# Patient Record
Sex: Female | Born: 1955 | Race: White | Marital: Married | Smoking: Never smoker
Health system: Northeastern US, Academic
[De-identification: ages and names within clinical notes are randomized; demographics above are authoritative.]

## PROBLEM LIST (undated history)

## (undated) VITALS — BP 132/74 | HR 75 | Temp 96.8°F | Resp 16 | Ht 63.0 in | Wt 110.3 lb

## (undated) VITALS — BP 122/68 | HR 69 | Temp 96.8°F | Resp 15 | Ht 63.0 in | Wt 111.0 lb

## (undated) DIAGNOSIS — I10 Essential (primary) hypertension: Secondary | ICD-10-CM

## (undated) DIAGNOSIS — Z8679 Personal history of other diseases of the circulatory system: Secondary | ICD-10-CM

## (undated) DIAGNOSIS — G4700 Insomnia, unspecified: Secondary | ICD-10-CM

## (undated) DIAGNOSIS — Z9889 Other specified postprocedural states: Secondary | ICD-10-CM

## (undated) DIAGNOSIS — R112 Nausea with vomiting, unspecified: Secondary | ICD-10-CM

## (undated) DIAGNOSIS — R0602 Shortness of breath: Secondary | ICD-10-CM

## (undated) DIAGNOSIS — I340 Nonrheumatic mitral (valve) insufficiency: Secondary | ICD-10-CM

## (undated) DIAGNOSIS — I480 Paroxysmal atrial fibrillation: Secondary | ICD-10-CM

## (undated) DIAGNOSIS — G250 Essential tremor: Secondary | ICD-10-CM

## (undated) DIAGNOSIS — Z973 Presence of spectacles and contact lenses: Secondary | ICD-10-CM

## (undated) DIAGNOSIS — R011 Cardiac murmur, unspecified: Secondary | ICD-10-CM

## (undated) DIAGNOSIS — I509 Heart failure, unspecified: Secondary | ICD-10-CM

## (undated) DIAGNOSIS — I471 Supraventricular tachycardia, unspecified: Secondary | ICD-10-CM

## (undated) DIAGNOSIS — I341 Nonrheumatic mitral (valve) prolapse: Secondary | ICD-10-CM

## (undated) DIAGNOSIS — E7800 Pure hypercholesterolemia, unspecified: Secondary | ICD-10-CM

## (undated) DIAGNOSIS — M1990 Unspecified osteoarthritis, unspecified site: Secondary | ICD-10-CM

## (undated) DIAGNOSIS — M8580 Other specified disorders of bone density and structure, unspecified site: Secondary | ICD-10-CM

## (undated) DIAGNOSIS — K589 Irritable bowel syndrome without diarrhea: Secondary | ICD-10-CM

## (undated) DIAGNOSIS — K219 Gastro-esophageal reflux disease without esophagitis: Secondary | ICD-10-CM

## (undated) DIAGNOSIS — I639 Cerebral infarction, unspecified: Secondary | ICD-10-CM

## (undated) DIAGNOSIS — M25371 Other instability, right ankle: Secondary | ICD-10-CM

## (undated) DIAGNOSIS — M810 Age-related osteoporosis without current pathological fracture: Secondary | ICD-10-CM

## (undated) DIAGNOSIS — E039 Hypothyroidism, unspecified: Secondary | ICD-10-CM

## (undated) DIAGNOSIS — M419 Scoliosis, unspecified: Secondary | ICD-10-CM

## (undated) DIAGNOSIS — R569 Unspecified convulsions: Secondary | ICD-10-CM

## (undated) DIAGNOSIS — M25372 Other instability, left ankle: Secondary | ICD-10-CM

## (undated) DIAGNOSIS — Q2730 Arteriovenous malformation, site unspecified: Secondary | ICD-10-CM

## (undated) DIAGNOSIS — H9319 Tinnitus, unspecified ear: Secondary | ICD-10-CM

## (undated) DIAGNOSIS — E785 Hyperlipidemia, unspecified: Secondary | ICD-10-CM

## (undated) HISTORY — PX: HX TONSILLECTOMY/ADENOIDECTOMY: SHX292

## (undated) HISTORY — PX: CONVERTED PROCEDURE: SHX9999

## (undated) HISTORY — DX: Essential (primary) hypertension: I10

## (undated) HISTORY — PX: COLONOSCOPY: SHX174

## (undated) HISTORY — DX: Hypothyroidism, unspecified: E03.9

## (undated) HISTORY — PX: HAND SURGERY: SHX662

## (undated) HISTORY — PX: CRANIOTOMY: SHX93

## (undated) HISTORY — PX: WRIST FRACTURE SURGERY: SHX121

## (undated) HISTORY — DX: Cerebral infarction, unspecified: I63.9

## (undated) HISTORY — PX: OTHER SURGICAL HISTORY: SHX169

## (undated) HISTORY — DX: Age-related osteoporosis without current pathological fracture: M81.0

## (undated) HISTORY — DX: Hyperlipidemia, unspecified: E78.5

## (undated) HISTORY — DX: Gastro-esophageal reflux disease without esophagitis: K21.9

## (undated) HISTORY — DX: Unspecified convulsions: R56.9

## (undated) HISTORY — DX: Irritable bowel syndrome, unspecified: K58.9

## (undated) HISTORY — PX: TONSILLECTOMY: SHX28A

## (undated) HISTORY — DX: Nonrheumatic mitral (valve) insufficiency: I34.0

## (undated) HISTORY — DX: Supraventricular tachycardia: I47.1

## (undated) HISTORY — PX: VARICOSE VEIN SURGERY: SHX832

## (undated) HISTORY — DX: Paroxysmal atrial fibrillation: I48.0

## (undated) HISTORY — PX: ABDOMINAL HYSTERECTOMY: SHX81

## (undated) HISTORY — PX: WISDOM TOOTH EXTRACTION: SHX21

## (undated) HISTORY — DX: Supraventricular tachycardia, unspecified: I47.10

## (undated) HISTORY — PX: CARDIAC CATHETERIZATION: SHX172

---

## 2005-10-19 DIAGNOSIS — M810 Age-related osteoporosis without current pathological fracture: Secondary | ICD-10-CM | POA: Insufficient documentation

## 2005-10-19 DIAGNOSIS — N3941 Urge incontinence: Secondary | ICD-10-CM | POA: Insufficient documentation

## 2005-10-19 DIAGNOSIS — L719 Rosacea, unspecified: Secondary | ICD-10-CM

## 2005-10-19 DIAGNOSIS — M4120 Other idiopathic scoliosis, site unspecified: Secondary | ICD-10-CM | POA: Insufficient documentation

## 2005-10-19 DIAGNOSIS — I629 Nontraumatic intracranial hemorrhage, unspecified: Secondary | ICD-10-CM | POA: Insufficient documentation

## 2005-10-19 DIAGNOSIS — K589 Irritable bowel syndrome without diarrhea: Secondary | ICD-10-CM | POA: Insufficient documentation

## 2005-10-19 DIAGNOSIS — I10 Essential (primary) hypertension: Secondary | ICD-10-CM | POA: Insufficient documentation

## 2005-10-19 DIAGNOSIS — I781 Nevus, non-neoplastic: Secondary | ICD-10-CM | POA: Insufficient documentation

## 2005-10-19 DIAGNOSIS — J309 Allergic rhinitis, unspecified: Secondary | ICD-10-CM | POA: Insufficient documentation

## 2005-10-19 DIAGNOSIS — E039 Hypothyroidism, unspecified: Secondary | ICD-10-CM | POA: Insufficient documentation

## 2005-10-19 HISTORY — DX: Urge incontinence: N39.41

## 2005-10-19 HISTORY — DX: Rosacea, unspecified: L71.9

## 2006-06-05 DIAGNOSIS — G40909 Epilepsy, unspecified, not intractable, without status epilepticus: Secondary | ICD-10-CM | POA: Insufficient documentation

## 2009-05-27 ENCOUNTER — Encounter: Payer: Self-pay | Admitting: Cardiology

## 2009-09-01 ENCOUNTER — Ambulatory Visit
Admit: 2009-09-01 | Discharge: 2009-09-01 | Disposition: A | Discharge status: Home / Self Care | Payer: Self-pay | Source: Ambulatory Visit | Attending: Primary Care | Admitting: Primary Care

## 2009-09-09 ENCOUNTER — Ambulatory Visit
Admit: 2009-09-09 | Discharge: 2009-09-09 | Disposition: A | Discharge status: Home / Self Care | Payer: Self-pay | Source: Ambulatory Visit | Attending: Primary Care | Admitting: Primary Care

## 2009-09-13 ENCOUNTER — Ambulatory Visit
Admit: 2009-09-13 | Discharge: 2009-09-13 | Disposition: A | Discharge status: Home / Self Care | Payer: Self-pay | Source: Ambulatory Visit | Attending: Neurology | Admitting: Neurology

## 2009-09-13 ENCOUNTER — Ambulatory Visit
Admit: 2009-09-13 | Discharge: 2009-09-13 | Disposition: A | Discharge status: Home / Self Care | Payer: Self-pay | Source: Ambulatory Visit | Attending: Primary Care | Admitting: Primary Care

## 2009-09-14 ENCOUNTER — Ambulatory Visit
Admit: 2009-09-14 | Discharge: 2009-09-14 | Disposition: A | Discharge status: Home / Self Care | Payer: Self-pay | Source: Ambulatory Visit | Attending: Neurology | Admitting: Neurology

## 2009-09-15 ENCOUNTER — Ambulatory Visit
Admit: 2009-09-15 | Discharge: 2009-09-15 | Disposition: A | Discharge status: Home / Self Care | Payer: Self-pay | Source: Ambulatory Visit | Attending: Orthopedic Surgery | Admitting: Orthopedic Surgery

## 2009-09-21 ENCOUNTER — Ambulatory Visit: Payer: Self-pay | Admitting: Rehabilitative and Restorative Service Providers"

## 2009-09-28 ENCOUNTER — Ambulatory Visit: Payer: Self-pay | Admitting: Rehabilitative and Restorative Service Providers"

## 2009-10-05 ENCOUNTER — Ambulatory Visit: Payer: Self-pay | Admitting: Rehabilitative and Restorative Service Providers"

## 2009-10-07 ENCOUNTER — Ambulatory Visit: Payer: Self-pay | Admitting: Rehabilitative and Restorative Service Providers"

## 2009-10-12 ENCOUNTER — Ambulatory Visit: Payer: Self-pay | Admitting: Rehabilitative and Restorative Service Providers"

## 2009-10-19 ENCOUNTER — Ambulatory Visit: Payer: Self-pay | Admitting: Neurology

## 2009-10-26 ENCOUNTER — Ambulatory Visit: Payer: Self-pay | Admitting: Rehabilitative and Restorative Service Providers"

## 2009-10-27 ENCOUNTER — Ambulatory Visit: Payer: Self-pay | Admitting: Orthopedic Surgery

## 2009-10-28 NOTE — Progress Notes (Signed)
 CHIEF COMPLAINT: Status post distal radius fracture.    INTERVAL HISTORY: Doing extremely well.    PHYSICAL EXAMINATION: On physical examination she has near full supination  and pronation.  Some weakness is still noted.  Some slight ulnar sided  wrist pain.    ASSESSMENT: Same.    PLAN: Doing well.  We will have her continue with physical therapy for  strengthening.  I will see her back in two months.                Electronically Signed and Finalized  by  Tyler Pita, MD 10/28/2009 13:40  ___________________________________________  Tyler Pita, MD      DD:   10/27/2009  DT:   10/27/2009  3:26 P  ZOX/[id number]  [id number]    cc:

## 2009-11-02 ENCOUNTER — Ambulatory Visit: Payer: Self-pay | Admitting: Rehabilitative and Restorative Service Providers"

## 2009-11-04 ENCOUNTER — Encounter: Payer: Self-pay | Admitting: Gastroenterology

## 2009-11-09 ENCOUNTER — Ambulatory Visit: Payer: Self-pay | Admitting: Rehabilitative and Restorative Service Providers"

## 2009-11-09 NOTE — Progress Notes (Signed)
Sports PT Evaluation   SPORTS REHABILITATION STATUS NOTE     Diagnosis: right distal radius fracture  Referring MD: Dr. Felipa Evener     History:  the patient is a 53 year old female referred to physical therapy   for evaluation and treatment of her right wrist radial fracture.  At this   time she reports continued improvement in her right wrist strength and   function.  Pain:  0/10 at rest.     OBJECTIVE   Observation:  unremarkable  UEA:VWUJW wrist flexion 60 degrees and extension 45 degrees.  Right wrist   pronation and supination are within normal limits.  Strength:right wrist flexion, extension, supination and pronation continued   to be a 4/5.  Right thenar opposition is a 5/5.  Neuro: deferred  Special Tests:deferred  Functional Eval:deferred           Assessment: clinical findings are consistent with a right distal radius   fracture with overall improved range of motion and continued strength   deficits.  Rehab Prognosis:fair to good  Contraindications/Precautions:no contraindications to physical therapy are   present at this time.     GOALS  JXB:JYNW-GNFA goals will be met in 4 weeks.  Increase right wrist strength to a 5/5 in all planes.  Return to ADLs with no further complaints of functional limitations.     PLAN: Plan and treatment options reviewed with patient.   Recommend treatment freq.  one time per week for 4 weeks   Treatment to include wrist active range of motion exercises, progressive   strengthening exercises, grip strengthening and home exercise program   progression.        Thank you for the referral of this patient to [facility]. Please do not hesitate to contact me if I may be of   assistance.     Sincerely,      Noni Saupe, PT, SCS, CSCS  .  Signature   Electronically signed by: Loney Hering  PT,CSCS; 11/09/2009   2:55 PM EST.

## 2009-11-16 ENCOUNTER — Ambulatory Visit: Payer: Self-pay | Admitting: Rehabilitative and Restorative Service Providers"

## 2009-11-29 ENCOUNTER — Encounter: Payer: Self-pay | Admitting: Gastroenterology

## 2009-11-30 ENCOUNTER — Ambulatory Visit: Payer: Self-pay | Admitting: Rehabilitative and Restorative Service Providers"

## 2009-12-07 ENCOUNTER — Ambulatory Visit: Payer: Self-pay | Admitting: Rehabilitative and Restorative Service Providers"

## 2009-12-14 ENCOUNTER — Ambulatory Visit: Payer: Self-pay | Admitting: Rehabilitative and Restorative Service Providers"

## 2009-12-21 ENCOUNTER — Ambulatory Visit: Payer: Self-pay | Admitting: Rehabilitative and Restorative Service Providers"

## 2009-12-22 ENCOUNTER — Ambulatory Visit: Payer: Self-pay | Admitting: Orthopedic Surgery

## 2009-12-22 NOTE — Progress Notes (Signed)
 Sports PT Evaluation   SPORTS REHABILITATION STATUS NOTE     Diagnosis: right distal radius fracture and bilateral Achilles tightness  Referring MD: Dr. Felipa Evener and Dr. Gaston Islam     History:  the patient is a 54 year old female referred to physical therapy   for treatment of her right distal radius fracture and bilateral Achilles   tightness.  Overall she reports continued gradual improvement in her right   wrist strength and function.  At this time she has minimal functional   limitations do to her right wrist by the patient's report.  She does   continue to complain of significant balance deficits.  Pain:  right wrist pain is a 2-3/10 at its worst.  No complaints of right   forefoot pain on this date.     OBJECTIVE   Observation:  ambulation is with a widened base of support and slightly   antalgic.  ZOX:WRUEA wrist flexion is 75 degrees and extension to 60 degrees.  Right   wrist pronation and supination are within normal limits.  Strength:right wrist flexion is a 4/5 and extension is 5/5.  thenar   opposition is a 5/5 on the right.  right grip strength with the elbow   flexed is 3 pounds and with the elbow extended is 25 pounds.  Left grip   strength with the elbow flexed is 20 pounds and with the elbow extended is   25 pounds.  Neuro: deferred  Special Tests:deferred  Functional Eval:unable to maintain unilateral balance without upper   extremity support.  Unable to maintain tandem stance without upper   extremity support.           Assessment: clinical findings are consistent with a right distal radius   fracture with functional range of motion and improving strength.  Also   continued gait and balance deficits.  Rehab Prognosis:fair  Contraindications/Precautions:none     GOALS  VWU:JWJX-BJYN goals will be met in 4 weeks.  Increase right wrist flexion strength to be 5/5.  He improved in her balance to 15 seconds without upper extremity support.     PLAN: Plan and treatment options reviewed with patient.    Recommend treatment freq.  one time per week for 4 weeks   Treatment to include progressive wrist strengthening, balance training,   gait training, and home exercise program progression.        Thank you for the referral of this patient to [facility]. Please do not hesitate to contact me if I may be of   assistance.     Sincerely,      Noni Saupe, PT, SCS, CSCS  .  Signature   Electronically signed by: Loney Hering  PT,CSCS; 12/22/2009   9:53 AM EST.

## 2009-12-28 ENCOUNTER — Ambulatory Visit: Payer: Self-pay | Admitting: Rehabilitative and Restorative Service Providers"

## 2009-12-29 ENCOUNTER — Ambulatory Visit: Payer: Self-pay | Admitting: Orthopedic Surgery

## 2010-01-04 ENCOUNTER — Ambulatory Visit: Payer: Self-pay | Admitting: Rehabilitative and Restorative Service Providers"

## 2010-01-16 ENCOUNTER — Emergency Department: Admit: 2010-01-16 | Disposition: A | Discharge status: Home / Self Care | Payer: Self-pay | Source: Ambulatory Visit

## 2010-01-16 NOTE — ED Provider Notes (Signed)
 VISIT NUMBER:  [id number].    I saw and evaluated the patient.  I agree with the resident's/fellow's  finding and plan of care as documented.  Details of my evaluation are as  follows.    HPI:  This is a 54 year old female who presents here with left wrist pain  after an injury.  She missed a chair, fell over, caught herself on her left  wrist and since then it has been somewhat sore.  She is able to move it and  perform tasks without difficulty, but given her previous injury there,  plating and screws.  She has not had any head injury, loss of  consciousness, neck, back, or other injuries.    ALLERGIES:  Dilantin, Bactrim, sulfa, penicillin.  MEDICATIONS:     1. Tegretol.     2. Claritin.     3. Synthroid.     4. Norvasc.    PAST MEDICAL / SURGICAL HISTORY:  Significant for AVM rupture,  hypothyroidism, hypertension, seizures.    FAMILY HISTORY:  Significant for hypertension.    SOCIAL HISTORY:   No tobacco or alcohol  use.    REVIEW OF SYSTEMS:   Please see notation in paper chart.    PHYSICAL EXAMINATION:  Temperature:  36.9.  Respirations:  16.  Pulse:  78.  Blood pressure:  172/100.  Pulse oximetry:  99% on room air.  General:  Age appropriate female resting comfortably, acting  appropriately.  Extremities:  Examination of her left wrist shows slightly diffuse  tenderness, no point tenderness present.  No snuffbox tenderness.  Sensation is intact.  Motor is intact.  Range of motion is limited due to  previous surgery, but intact.    MEDICAL DECISION MAKING/CONDITION:   A 54 year old female who comes in with  left wrist injury.  Will check wrist films and if they are okay, we will  discharge her home.  If not, we will consult with Orthopedic Surgery.        DDX:   Fracture, sprain, hardware failure.    DATA:        RADIOLOGIC STUDIES:    X-rays, per Radiology no acute fractures or  changes to the hardware.    FOLLOW-UP NOTE AND DISPOSITION:   Patient has done well throughout her stay  here.  We reassured her and  will discharge her home.    ED DIAGNOSIS:   Wrist sprain.                                        Electronically Signed and Finalized  by  Otilio Saber, MD 01/17/2010 08:46  ___________________________________________  Otilio Saber, MD      DD:   01/16/2010  DT:   01/16/2010  8:53 P  [id number]  [id number]    cc:   Deirdre Priest, MD

## 2010-01-18 ENCOUNTER — Ambulatory Visit: Payer: Self-pay

## 2010-01-18 ENCOUNTER — Ambulatory Visit: Payer: Self-pay | Admitting: Rehabilitative and Restorative Service Providers"

## 2010-01-21 NOTE — Progress Notes (Signed)
CHIEF COMPLAINT: Status post distal radius fracture.    INTERVAL HISTORY: Doing well.    PHYSICAL EXAMINATION: On physical examination she has excellent range of  motion, improving supination and pronation.  Neurovascular exam is intact.    ASSESSMENT: Same.    PLAN: I discussed in detail my findings with her.  Making good progress.  She will continue with therapy in the short term and will follow up p.r.n.                  Electronically Signed and Finalized  by  Tyler Pita, MD 01/21/2010 13:35  ___________________________________________  Tyler Pita, MD      DD:   12/29/2009  DT:   12/29/2009  1:46 P  RUE/[id number]  [id number]    cc:   Deirdre Priest, MD

## 2010-01-24 ENCOUNTER — Ambulatory Visit: Payer: Self-pay | Admitting: Orthopedic Surgery

## 2010-01-25 ENCOUNTER — Ambulatory Visit: Payer: Self-pay | Admitting: Rehabilitative and Restorative Service Providers"

## 2010-01-25 ENCOUNTER — Ambulatory Visit: Payer: Self-pay | Admitting: Otolaryngology

## 2010-01-25 NOTE — Progress Notes (Signed)
Otolaryngology Consultation Note   Dear Dr. Deirdre Priest:     I had the pleasure of seeing your patient, [name] in the [facility] today. She is a 54 year old female being seen for   evaluation of hearing loss.  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  She is not aware of any major hearing loss at   that time.  She has noted bilateral tinnitus for about the last year.  She   notes bilateral fullness most of the time.  She denies significant chronic   nasal stuffiness.  She is having a little bit of difficulty hearing, and   feels that her right ear is worse than her left.  Allergies   Azithromycin TABS; Nausea  Celebrex CAPS  Dilantin CAPS; Edema  Ditropan TABS; Itching  Erythromycin TABS; Nausea  Latex-asked/denied  Norvasc TABS; Constipation  Penicillins; Hives  Sulfa Drugs  Vasotec TABS; Fatigue.  Current Meds   Claritin 10 MG Tablet;TAKE 1 TABLET DAILY.; RPT  Caltrate 600 + D 600-200 MG-IU TABS;TAKE  TABLET 3 TIMES DAILY; RPT  Multivitamins Tablet;TAKE 1 TABLET DAILY.; RPT  TEGretol XR 100 MG Tablet Extended Release 12 Hour;TAKE 1 TABLET TWICE   DAILY; Rx  Advair Diskus 250-50 MCG/DOSE Aerosol Powder Breath Activated;Inhale 1 puff   by mouth twice a day; Rx  Levothyroxine Sodium 25 MCG Tablet;TAKE 1 TABLET BY MOUTH   EVERY DAY; Rx  TEGretol XR 200 MG Tablet Extended Release 12 Hour;TAKE 2 TABLET TWICE   DAILY; Rx  Enablex 15 MG Tablet Extended Release 24 Hour;TAKE 1 TABLET DAILY.; Rx  Astelin 137 MCG/SPRAY Solution;USE 2 SPRAYS 4 TIMES DAILY AS DIRECTED; Rx  Noritate 1 % Cream;; RPT  AmLODIPine Besylate 5 MG Tablet;TAKE 1 TABLET DAILY.; Rx.  ROS   The Otolaryngology Medical History and Review of Systems questionnaire,   which includes medical, surgical, social, and family history, was reviewed   with the patient and scanned to the chart.  Significant positive findings   include hypertension, seizures, and AVM  rupture.  Marland Kitchen  Results   An audiogram from [place] [facility] and there was reviewed which   shows downsloping hearing in both ears.  She has a 15 to 20 dB asymmetry at   2000 and 3000 Hz, as well as at 8000 Hz, worse in the right ear.  Vital Signs   Recorded by FOTI,ERICA on 25 Jan 2010 10:30 AM  BP:135/72,   HR: 155 b/min,   Temp: 36.8 C,   Pain Scale: 0,   O2 Sat: 99 (%SpO2).  Physical Exam   GENERAL: Appears well, normal development. NAD. Color good.      HEAD/FACE: The head and face reveal normal facial symmetry without lesions   or scars. The salivary glands are palpated and appear normal without   tenderness. There is normal facial nerve strength and symmetry. Extraocular   motion is full without restriction of gaze, or diplopia.     EARS: Both pinnas are normal in appearance with no scars, lesions or   masses. The ear canals and both tympanic membranes are intact without   inflammation. There is no mastoid erythema or tenderness. Both TM's are   mobile to pneumatoscopy without evidence of middle ear effusions.      NOSE: The nasal mucosa is not inflamed but is somewhat dry and no polyps or  masses are visualized. The nasal septum is essentially midline and the   inferior turbinates are normal.  The paranasal sinuses are nontender.    There is no evidence of septal perforation.     ORAL CAVITY: The buccal and oral mucosa are without lesions and the mouth   and oropharynx are normal in appearance without lesions or inflammation.   The lips, teeth and tongue appear normal.     NECK: There are no evident masses, there is no asymmetry nor is there any   cervical lymphadenopathy noted. The trachea is midline and the thyroid   gland reveals no enlargement, tenderness nor mass effect.  Assessment   ** Medication reconciliation completed and patient declined printed list. 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  Her word   discrimination scores are 96% bilaterally.  Acoustic reflexes are  present,   according to her audiogram from [facility] [facility] [employer].  This   point I feel that her risk for retrocochlear pathology is very low.  I have   recommended a follow-up audiogram in 6 months.  She is a candidate for   amplification in the right ear, and may pursue that either here or at [facility].    She will contact me if she develops any new or unusual symptoms.  Please   feel free to call me if you have any questions or concerns about her.     With best personal regards        Emilio Aspen, M.D.  Associate Professor  Otology/Neurotology  Department of Otolaryngology     Dictated with voice recognition software, not read.  Nurse, mental health   Electronically signed by: Zenovia Jarred  MD Attend.; 01/25/2010 11:49 AM EST.

## 2010-01-27 DIAGNOSIS — H903 Sensorineural hearing loss, bilateral: Secondary | ICD-10-CM | POA: Insufficient documentation

## 2010-01-27 DIAGNOSIS — H9319 Tinnitus, unspecified ear: Secondary | ICD-10-CM | POA: Insufficient documentation

## 2010-01-27 HISTORY — DX: Sensorineural hearing loss, bilateral: H90.3

## 2010-02-01 ENCOUNTER — Ambulatory Visit: Payer: Self-pay | Admitting: Rehabilitative and Restorative Service Providers"

## 2010-02-07 NOTE — Progress Notes (Signed)
CHIEF COMPLAINT:  Status post fall onto left wrist.    INTERVAL HISTORY:  The patient is here today for evaluation.  She is seen  in the ER after a fall on her wrist.    IMAGING STUDIES:  X-rays were reviewed and no fractures were identified.    PHYSICAL EXAMINATION:  Almost no swelling is noted.  She has good range of  motion.    PLAN:  She will follow up p.r.n.                  Electronically Signed and Finalized  by  Tyler Pita, MD 02/07/2010 12:03  ___________________________________________  Tyler Pita, MD      DD:   01/24/2010  DT:   01/24/2010  6:42 P  AVW/[id number]  [id number]    cc:

## 2010-02-08 ENCOUNTER — Ambulatory Visit: Payer: Self-pay | Admitting: Rehabilitative and Restorative Service Providers"

## 2010-02-15 ENCOUNTER — Ambulatory Visit: Payer: Self-pay | Admitting: Rehabilitative and Restorative Service Providers"

## 2010-02-15 NOTE — Progress Notes (Signed)
Sports PT Evaluation   SPORTS REHABILITATION STATUS NOTE     Diagnosis: right distal radius fracture and bilateral Achilles tightness  Referring MD: Dr. Felipa Evener and Dr. Gaston Islam     History:  the patient is a 56 showed female referred to physical therapy   for treatment of her right distal radius fracture and bilateral Achilles   tightness.  At this time she reports increasing right wrist strength with   decreasing complaints of functional limitations.  She is able to use her   right upper extremity to open doors and ADLs with minimal complaints of   pain.  She also reports slight improvement in balance and ability to   ambulate.  Pain:  right wrist pain is a 4/10 at its worst     OBJECTIVE   Observation:  unremarkable  RUE:AVWUJ wrist flexion is 75 degrees and extension is 70 degrees active   range of motion.  Right wrist pronation and supination are within normal   limits.  Strength:right grip strength is 30 pounds and left grip strength is 17   pounds with the elbow flexed.  Right wrist flexion and extension strength   are both a 5/5.  Neuro: deferred  Special Tests:deferred  Functional Eval:unable to maintain unilateral balance without upper   extremity support for greater than 5 seconds.  Ambulation continues with a   wide base of support and significant lateral deviations.           Assessment: clinical findings are consistent with a right distal radius   fracture with increased right upper extremity strength.  The patient also   continues to demonstrate significant balance deficits.  Rehab Prognosis:fair  Contraindications/Precautions:none     GOALS  WJX:BJYN-WGNF goals were met in 8 weeks.  Increase unilateral balance to greater than 15 seconds without upper   extremity support.  Return to ADLs with the right upper extremity with 0/10 complaints of pain.     PLAN: Plan and treatment options reviewed with patient.   Recommend treatment freq.  one time per week for 8 weeks   Treatment to include right upper  shoulder strengthening, proprioception   training, lower extremity strengthening and home exercise program   progression.        Thank you for the referral of this patient to [facility]. Please do not hesitate to contact me if I may be of   assistance.     Sincerely,      Noni Saupe, PT, SCS, CSCS  .  Signature   Electronically signed by: Loney Hering  PT,CSCS; 02/15/2010   1:46 PM EST.

## 2010-02-22 ENCOUNTER — Ambulatory Visit: Payer: Self-pay

## 2010-03-01 ENCOUNTER — Ambulatory Visit: Payer: Self-pay | Admitting: Rehabilitative and Restorative Service Providers"

## 2010-03-08 ENCOUNTER — Ambulatory Visit: Payer: Self-pay | Admitting: Rehabilitative and Restorative Service Providers"

## 2010-03-14 ENCOUNTER — Ambulatory Visit: Payer: Self-pay | Admitting: Primary Care

## 2010-03-15 ENCOUNTER — Ambulatory Visit: Payer: Self-pay | Admitting: Rehabilitative and Restorative Service Providers"

## 2010-03-15 ENCOUNTER — Ambulatory Visit: Payer: Self-pay | Admitting: Neurology
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
## 2010-03-21 ENCOUNTER — Ambulatory Visit
Admit: 2010-03-21 | Discharge: 2010-03-21 | Disposition: A | Discharge status: Home / Self Care | Payer: Self-pay | Source: Ambulatory Visit | Attending: Neurology | Admitting: Neurology

## 2010-03-21 ENCOUNTER — Ambulatory Visit
Admit: 2010-03-21 | Discharge: 2010-03-21 | Disposition: A | Discharge status: Home / Self Care | Payer: Self-pay | Source: Ambulatory Visit | Attending: Primary Care | Admitting: Primary Care

## 2010-03-21 LAB — BASIC METABOLIC PANEL
Anion Gap: 9 (ref 7–16)
CO2: 31 mmol/L — ABNORMAL HIGH (ref 20–28)
Calcium: 9.5 mg/dL (ref 8.6–10.2)
Chloride: 102 mmol/L (ref 96–108)
Creatinine: 0.65 mg/dL (ref 0.51–0.95)
GFR,Black: >59 *
GFR,Caucasian: >59 *
Glucose: 130 mg/dL — ABNORMAL HIGH (ref 74–106)
Lab: 18 mg/dL (ref 6–20)
Potassium: 4.4 mmol/L (ref 3.3–5.1)
Sodium: 142 mmol/L (ref 133–145)

## 2010-03-21 LAB — CARBAMAZEPINE: Carbamazepine: 9 ug/mL (ref 4.0–12.0)

## 2010-03-21 LAB — TSH: TSH: 2.02 u[IU]/mL (ref 0.27–4.20)

## 2010-03-22 ENCOUNTER — Ambulatory Visit: Payer: Self-pay | Admitting: Rehabilitative and Restorative Service Providers"

## 2010-03-22 NOTE — Progress Notes (Signed)
Neurology Stroke Clinic Note   Dear Herbert Seta  I had the pleasure of seeing [name] today along with her husband   for follow-up of her seizures and arteriovenous malformation, status post   surgical repair.  She has had no recurrent seizure activity.  She has been   tolerating her Tegretol well.  She continues to receive physical therapy   once weekly for gait training.  She is also being evaluated for hearing   loss in the right ear and has been recommended for amplification.     Review of Systems: Patient describes no shortness of breath, no chest pain,   no abdominal pain, no nausea or vomiting, no bowel or bladder symptoms, no   joint pain, and no skin changes. Recovering from left wrist sprain in   February.     Examination:  Overall she appears well.  Her speech is clear, comprehension is intact.   her affect is appropriate.  Cranial nerves: Visual fields are full to   confrontation.  Versions are full without nystagmus.  Facial strength is   normal.  She has good power throughout.  She had a mild sustention tremor   bilaterally.  Her gait remains mildly wide-based.     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  Her Tegretol level is 9.0 despite adding Prevacid for a   recent GI illness.  We discussed at length the possibility of switching to   Lamictal to attempt to decrease her reports of cognitive dulling or perhaps   to have her undergo a more formal evaluation through [facility].  She would like to think about these options.  For now she will   continue on her current medications.  I plan seeing her again in 6 months.     Sincerely,  Greer Pickerel M.D. M.P.H.  Associate Professor of Neurology and Neurosurgery.  Current Meds   Claritin 10 MG Tablet;TAKE 1 TABLET DAILY.; RPT  Caltrate 600 + D 600-200 MG-IU TABS;TAKE  TABLET 3 TIMES DAILY; RPT  Multivitamins Tablet;TAKE 1 TABLET DAILY.; RPT  AmLODIPine Besylate 5 MG Tablet;TAKE 1  TABLET DAILY.; Rx  Advair Diskus 250-50 MCG/DOSE Aerosol Powder Breath Activated;Inhale 1 puff   by mouth twice a day; Rx  Levothyroxine Sodium 25 MCG Tablet;TAKE 1 TABLET BY MOUTH   EVERY DAY; Rx  TEGretol XR 200 MG Tablet Extended Release 12 Hour;TAKE 2 TABLET TWICE   DAILY; RPT  Non-medication order(s);PT: evaluate and treatDx: bilateral lower extremity   weakness Osteoporosis, Sz d/o s/p AVM rupture; Rx  Vitamin D 1000 UNIT Capsule;TAKE 1 CAPSULE 3 TIMES DAILY; RPT  TEGretol XR 100 MG Tablet Extended Release 12 Hour;TAKE 1 TABLET TWICE   DAILY; Rx  Azelastine HCl 137 MCG/SPRAY Solution;USE 2 SPRAYS 4 TIMES DAILY AS   DIRECTED; Rx  Atelvia 35 MG Tablet Delayed Release;TAKE 1 TABLET BY MOUTH WEEKLY; RPT  Enablex 15 MG Tablet Extended Release 24 Hour;TAKE 1 TABLET DAILY.; Rx  Noritate 1 % Cream;; RPT.  Family Hx   Sororal history of Allergies; foods  Maternal history of Dementia  Family history of Family Health Status ___ Children Living; one son.  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  Vital Signs   Recorded by GOVAN,DEREK on 15 Mar 2010 09:15 AM  BP:129/71,  LUE,  Sitting,   HR: 72 b/min,   Weight: 118.4  lb,   Pain Scale: 0  Recorded by [facility] on 14 Mar 2010 09:33 AM  BP:132/80,  LUE,  Sitting  Recorded by Jeanella Anton on 14 Mar 2010 09:09 AM  BP:142/80,  LUE,  Sitting,   HR: 72 b/min,  L Radial, Regular,   Weight: 116.8 lb.  Signature   Electronically signed by: Greer Pickerel  M.D.; 03/22/2010 8:00 AM EST.

## 2010-03-23 LAB — VITAMIN D
25-OH VIT D2: <4 ng/mL
25-OH VIT D3: 44 ng/mL
25-OH Vit Total: 44 ng/mL (ref 30–80)

## 2010-03-25 ENCOUNTER — Ambulatory Visit: Payer: Self-pay | Admitting: Neurology

## 2010-03-28 ENCOUNTER — Ambulatory Visit: Payer: Self-pay

## 2010-03-29 ENCOUNTER — Ambulatory Visit: Payer: Self-pay | Admitting: Rehabilitative and Restorative Service Providers"

## 2010-03-29 NOTE — Progress Notes (Signed)
Sports PT Evaluation   SPORTS REHABILITATION STATUS NOTE     Diagnosis: right distal radius fracture, bilateral lower extremity weakness  Referring MD: Dr. Felipa Evener and Dr. Elmer Bales     History:  the patient is a 54 year old female referred to physical therapy   for her treatment of her distal radius fracture and bilateral lower   extremity weakness.  At this time she reports minimal complaints of pain in   her right wrist.  She continues to report difficulty with completing tasks   that require heavy gripping.  She also reports continued significant   balance deficits with lower extremity weakness.  Pain:  0/10 complaints of pain.     OBJECTIVE   Observation:  unremarkable  ZOX:WRUEA wrist range of motion is unchanged with flexion at 75 degrees,   extension 70 degrees and pronation/supination within normal limits.  Strength:right grip strength is 32 pounds and left grip strength is 22   pounds.  Neuro: deferred  Special Tests:deferred  Functional Eval:for both the left and right lower extremities the patient   is unable to balance with her eyes closed.  Left unilateral balance eyes   open  5 seconds and right unilateral balance eyes open 5 seconds.           Assessment: clinical findings are consistent with a right distal radius,   fracture with slow progressing right wrist/hand strength and bilateral   lower extremity deconditioning with balance deficits.  Rehab Prognosis:fair  Contraindications/Precautions:none     GOALS  VWU:JWJX-BJYN goals will be met in 8 weeks  Increased unilateral balance to greater than 15 seconds without upper   extremity support.  Increased right upper extremity strength to allow for gripping ADLs without   complaints of pain or limitation.     PLAN: Plan and treatment options reviewed with patient.   Recommend treatment freq.  one time per week for 8 weeks   Treatment to include right upper extremity strengthening, bilateral lower   extremity strengthening, proprioception training and  home exercise program   progression.        Thank you for the referral of this patient to [facility]. Please do not hesitate to contact me if I may be of   assistance.     Sincerely,      Noni Saupe, PT, SCS, CSCS  .  Signature   Electronically signed by: Loney Hering  PT,CSCS; 03/29/2010   1:27 PM EST.
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
## 2010-04-05 ENCOUNTER — Ambulatory Visit: Payer: Self-pay | Admitting: Rehabilitative and Restorative Service Providers"

## 2010-04-12 ENCOUNTER — Ambulatory Visit: Payer: Self-pay | Admitting: Rehabilitative and Restorative Service Providers"

## 2010-04-19 ENCOUNTER — Ambulatory Visit: Payer: Self-pay | Admitting: Rehabilitative and Restorative Service Providers"

## 2010-04-26 ENCOUNTER — Ambulatory Visit: Payer: Self-pay | Admitting: Rehabilitative and Restorative Service Providers"

## 2010-05-03 LAB — HM PAP SMEAR

## 2010-05-06 ENCOUNTER — Ambulatory Visit: Payer: Self-pay

## 2010-05-10 ENCOUNTER — Ambulatory Visit: Payer: Self-pay | Admitting: Rehabilitative and Restorative Service Providers"

## 2010-05-17 ENCOUNTER — Ambulatory Visit: Payer: Self-pay | Admitting: Rehabilitative and Restorative Service Providers"

## 2010-05-17 NOTE — Progress Notes (Signed)
Sports PT Evaluation   SPORTS REHABILITATION STATUS NOTE     Diagnosis: right distal radius fracture, bilateral lower extremity weakness  Referring MD: Dr. Carin Hock and Dr. Elmer Bales     History:  the patient is a 54 year old female referred to physical therapy   for treatment of her distal radius fracture and bilateral lower extremity   weakness.  She continues to report significant functional strength   limitations with gripping using her right hand.  She mainly complains of   weakness with thenar opposition.  She also continues to experience   bilateral lower extremity weakness with significant balance deficits.  Pain:  0/10 complaints of pain     OBJECTIVE   Observation:  unremarkable  ZOX:WRUEA wrist flexion is 75 degrees and extension is 70 degrees.  Right   forearm pronation and supination are within normal limits  Strength:right wrist flexion and extension are a 4/5.  Right forearm   pronation and supination are both a 4/5.  Right knee extension is a 3+/5 in flexion as a 4/5.  Left knee extension is   a 4/5 and flexion is a 4/5.  Neuro: deferred  Special Tests:deferred  Functional Eval:the patient continues to demonstrate significant balance   deficits with inability to balance in single leg stance with her eyes   closed.        Assessment: clinical findings are consistent with a right distal radius   fracture and bilateral lower extremity deconditioning with significant   balance deficits.  Rehab Prognosis:fair  Contraindications/Precautions:none     GOALS  VWU:JWJX-BJYN goals will be met in 8 weeks.  Improve bilateral lower extremity strength to a 5/5.  Increase right hand strength to allow for ADLs without complaints of   limitation.     PLAN: Plan and treatment options reviewed with patient.   Recommend treatment freq.  one time per week for 8 weeks   Treatment to include right upper extremity strengthening, proprioceptive   training, lower extremity strengthening and home exercise program   progression.        Thank you for the referral of this patient to [facility]. Please do not hesitate to contact me if I may be of   assistance.     Sincerely,      Noni Saupe, PT, SCS, CSCS  .  Signature   Electronically signed by: Loney Hering  PT,CSCS; 05/17/2010   1:59 PM EST.

## 2010-05-31 ENCOUNTER — Ambulatory Visit: Payer: Self-pay | Admitting: Rehabilitative and Restorative Service Providers"

## 2010-06-07 ENCOUNTER — Ambulatory Visit: Payer: Self-pay | Admitting: Rehabilitative and Restorative Service Providers"

## 2010-06-14 ENCOUNTER — Ambulatory Visit: Payer: Self-pay | Admitting: Rehabilitative and Restorative Service Providers"

## 2010-06-21 ENCOUNTER — Ambulatory Visit: Payer: Self-pay

## 2010-06-21 ENCOUNTER — Ambulatory Visit: Payer: Self-pay | Admitting: Rehabilitative and Restorative Service Providers"

## 2010-06-23 ENCOUNTER — Ambulatory Visit: Payer: Self-pay | Admitting: Rehabilitative and Restorative Service Providers"

## 2010-06-30 ENCOUNTER — Ambulatory Visit: Payer: Self-pay | Admitting: Rehabilitative and Restorative Service Providers"

## 2010-07-12 ENCOUNTER — Ambulatory Visit: Payer: Self-pay | Admitting: Rehabilitative and Restorative Service Providers"
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
## 2010-07-19 ENCOUNTER — Ambulatory Visit: Payer: Self-pay | Admitting: Rehabilitative and Restorative Service Providers"

## 2010-07-20 ENCOUNTER — Ambulatory Visit: Payer: Self-pay | Admitting: Rehabilitative and Restorative Service Providers"

## 2010-07-21 ENCOUNTER — Ambulatory Visit: Payer: Self-pay | Admitting: Rehabilitative and Restorative Service Providers"

## 2010-07-26 ENCOUNTER — Ambulatory Visit: Payer: Self-pay | Admitting: Rehabilitative and Restorative Service Providers"

## 2010-07-27 ENCOUNTER — Ambulatory Visit: Payer: Self-pay

## 2010-08-02 ENCOUNTER — Ambulatory Visit: Payer: Self-pay

## 2010-08-02 ENCOUNTER — Ambulatory Visit: Payer: Self-pay | Admitting: Rehabilitative and Restorative Service Providers"

## 2010-08-09 ENCOUNTER — Ambulatory Visit: Payer: Self-pay | Admitting: Rehabilitative and Restorative Service Providers"

## 2010-08-09 ENCOUNTER — Ambulatory Visit: Payer: Self-pay

## 2010-08-12 ENCOUNTER — Ambulatory Visit: Payer: Self-pay

## 2010-08-15 ENCOUNTER — Ambulatory Visit: Payer: Self-pay | Admitting: Primary Care

## 2010-08-15 ENCOUNTER — Encounter: Payer: Self-pay | Admitting: Primary Care
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
## 2010-08-16 ENCOUNTER — Ambulatory Visit: Payer: Self-pay | Admitting: Rehabilitative and Restorative Service Providers"

## 2010-08-23 ENCOUNTER — Ambulatory Visit: Payer: Self-pay

## 2010-08-30 ENCOUNTER — Ambulatory Visit: Payer: Self-pay | Admitting: Rehabilitative and Restorative Service Providers"

## 2010-09-06 ENCOUNTER — Ambulatory Visit: Payer: Self-pay

## 2010-09-06 ENCOUNTER — Ambulatory Visit: Payer: Self-pay | Admitting: Rehabilitative and Restorative Service Providers"

## 2010-09-12 ENCOUNTER — Ambulatory Visit
Admit: 2010-09-12 | Discharge: 2010-09-12 | Disposition: A | Discharge status: Home / Self Care | Payer: Self-pay | Source: Ambulatory Visit | Attending: Neurology | Admitting: Neurology

## 2010-09-12 LAB — CARBAMAZEPINE: Carbamazepine: 10.2 ug/mL (ref 4.0–12.0)

## 2010-09-13 ENCOUNTER — Ambulatory Visit: Payer: Self-pay | Admitting: Rehabilitative and Restorative Service Providers"

## 2010-09-13 ENCOUNTER — Ambulatory Visit: Payer: Self-pay | Admitting: Neurology

## 2010-09-13 NOTE — Progress Notes (Signed)
 Sports PT Evaluation   SPORTS REHABILITATION STATUS NOTE     Diagnosis: right distal radius fracture, bilateral lower extremity weakness  Referring MD: Dr. Felipa Evener and Dr. Elmer Bales     History:  the patient is a 30 showed female referred to physical therapy   for treatment of her right radial fracture and bilateral lower extremity   weakness.  At this time she does continue to report right upper extremity   weakness with ADLs.  She also continues to complain of difficulty with   ambulation secondary to balance disturbance.  Pain:  0/10 complaints of pain at rest.     OBJECTIVE   Observation:  ambulation is mildly antalgic with decreased stance time on   her right lower extremity.  ZOX:WRUEA wrist flexion is 35 degrees extension 70 degrees.  Strength:right wrist flexion and extension strength 4/5, right forearm   pronation supination 4/5. right knee extension 3+/5 and flexion 4/5.  left   knee extension and flexion 4/5.  Neuro: deferred  Special Tests:deferred  Functional Eval:the patient is unable to maintain single limb balance on   either leg with her eyes closed.           Assessment: clinical findings are consistent with continued right upper   extremity weakness and bilateral lower extremity weakness with balance   deficits.  Rehab Prognosis:fair  Contraindications/Precautions:none     GOALS  VWU:JWJX-BJYN goals will be met in 3 months.  Increase right upper extremity strength to a 5/5 in all planes.  increase bilateral lower extremity strength to a 5/5 in all planes.  Independent with a home exercise program.     PLAN: Plan and treatment options reviewed with patient.   Recommend treatment freq.  one time per week for 3 months   Treatment to include right upper shoulder strengthening, lower extremity   strengthening, balance training, gait training and home exercise program   progression.        Thank you for the referral of this patient to [facility]. Please do not hesitate to contact  me if I may be of   assistance.     Sincerely,      Noni Saupe, PT, SCS, CSCS  .  Signature   Electronically signed by: Loney Hering  PT,CSCS; 09/13/2010   3:19 PM EST.

## 2010-09-13 NOTE — Progress Notes (Signed)
 Neurology Stroke Clinic Note   Dear [name]  I had the pleasure of seeing [name] todayalong with her husband for   further evaluation of her history of arteriovenous malfirmation and   seizures. she has had no recurrent events suggestive of seizures.  She   continues to have generalized fatigue but she attributes this to   difficulties with sleep at night due to her son and his irregular sleep   habits.  She has had a number of symptoms attributable to her irritable   bowel syndrome and is undergoing medication adjustments per your office.    She continues to receive physical therapy weekly.     Her carbamazepine level from yesterday was 10.2; she generally runs in the   8-9 range.     Review of Systems: Patient describes no shortness of breath, no chest pain,   no bladder symptoms, no joint pain, and no skin changes.      Examination: She appears well.  Speech remains clear and her comprehension   intact.  She provides a detailed clinical history.  Affect appear   appropriate.  Cranial nerves: Visual fields appeared full to confrontation.    Versions are full without nystagmus.  Facial strength is intact.  She has   no pronator drift.  She continues to have a mild distention tremor.  There   is no clear ataxia.  Her gait remains wide-based with slight circumduction   of her right hip.     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  She will continue on her current dose of Tegretol-XR   at 500 mg twice daily.  I have asked her to repeat her carbamazepine level   in one month to see if it is continually high now that she is taking   pantoprazole.  She also expressed concern about the absorption of calcium   while taking this medication.  I plan seeing her again in 6 months.     Sincerely,  Greer Pickerel M.D. M.P.H.  Associate Professor of Neurology and Neurosurgery.  Current Meds   Claritin 10 MG Tablet;TAKE 1 TABLET DAILY.; RPT  Caltrate 600 + D  600-200 MG-IU TABS;TAKE  TABLET 3 TIMES DAILY; RPT  Multivitamins Tablet;TAKE 1 TABLET DAILY.; RPT  Noritate 1 % Cream;; RPT  AmLODIPine Besylate 5 MG Tablet;TAKE 1 TABLET DAILY.; Rx  Advair Diskus 250-50 MCG/DOSE Aerosol Powder Breath Activated;Inhale 1 puff   by mouth twice a day; Rx  Levothyroxine Sodium 25 MCG Tablet;TAKE 1 TABLET BY MOUTH   EVERY DAY; Rx  Enablex 15 MG Tablet Extended Release 24 Hour;TAKE 1 TABLET DAILY.; Rx  Atelvia 35 MG Tablet Delayed Release;TAKE 1 TABLET BY MOUTH WEEKLY; RPT  Non-medication order(s);PT: evaluate and treatDx: bilateral lower extremity   weakness Osteoporosis, Sz d/o s/p AVM rupture; Rx  Vitamin D 1000 UNIT Capsule;TAKE 1 CAPSULE 3 TIMES DAILY; RPT  Doxycycline Hyclate 100 MG Capsule;TAKE 1 CAPSULE TWICE DAILY.; Rx  Azelastine HCl 137 MCG/SPRAY Solution;USE 2 SPRAYS 4 TIMES DAILY AS   DIRECTED; Rx  Pantoprazole Sodium 40 MG Tablet Delayed Release;TAKE ONE TABLET BY MOUTH   TWICE DAILY; Rx  TEGretol XR 100 MG Tablet Extended Release 12 Hour;TAKE 1 TABLET TWICE   DAILY; Rx  TEGretol XR 200 MG Tablet Extended Release 12 Hour;TAKE 2 TABLET TWICE   DAILY; Rx.  Signature   Electronically signed by: Greer Pickerel  M.D.; 09/13/2010 12:46 PM EST.

## 2010-09-19 ENCOUNTER — Ambulatory Visit: Payer: Self-pay | Admitting: Primary Care

## 2010-09-20 ENCOUNTER — Ambulatory Visit: Payer: Self-pay | Admitting: Rehabilitative and Restorative Service Providers"

## 2010-09-20 ENCOUNTER — Ambulatory Visit: Payer: Self-pay
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
## 2010-09-27 ENCOUNTER — Ambulatory Visit: Payer: Self-pay | Admitting: Rehabilitative and Restorative Service Providers"

## 2010-09-30 ENCOUNTER — Ambulatory Visit: Payer: Self-pay | Admitting: Rehabilitative and Restorative Service Providers"

## 2010-10-03 ENCOUNTER — Ambulatory Visit: Payer: Self-pay | Admitting: Primary Care

## 2010-10-03 ENCOUNTER — Ambulatory Visit
Admit: 2010-10-03 | Discharge: 2010-10-03 | Disposition: A | Discharge status: Home / Self Care | Payer: Self-pay | Source: Ambulatory Visit | Attending: Neurology | Admitting: Neurology

## 2010-10-03 LAB — CARBAMAZEPINE: Carbamazepine: 8.2 ug/mL (ref 4.0–12.0)

## 2010-10-11 ENCOUNTER — Ambulatory Visit: Payer: Self-pay | Admitting: Rehabilitative and Restorative Service Providers"

## 2010-10-18 ENCOUNTER — Ambulatory Visit: Payer: Self-pay | Admitting: Rehabilitative and Restorative Service Providers"

## 2010-10-25 ENCOUNTER — Ambulatory Visit: Payer: Self-pay | Admitting: Rehabilitative and Restorative Service Providers"

## 2010-10-31 ENCOUNTER — Ambulatory Visit: Payer: Self-pay | Admitting: Primary Care
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
## 2010-11-08 ENCOUNTER — Ambulatory Visit: Payer: Self-pay | Admitting: Rehabilitative and Restorative Service Providers"

## 2010-11-09 NOTE — Progress Notes (Signed)
 Sports PT Evaluation   SPORTS REHABILITATION STATUS NOTE     Diagnosis: right distal radius fracture, bilateral lower extremity weakness  Referring MD: Dr. Felipa Evener and Dr. Elmer Bales     History:  the patient is a 54 year old female referred to physical therapy   for evaluation and treatment of her right radial fracture and bilateral   lower extremity weakness.  At this time she continues to report   intermittent complaints of right forearm soreness with prolonged use.  She   also complains of difficulty with ambulation secondary to her lower   extremity weakness and balance deficits.  Pain:  complaints of pain are 0/10 on rest.     OBJECTIVE   Observation:  ambulation is mildly antalgic with decreased stance time on   the right lower extremity.  ZOX:WRUEA wrist flexion 45 degrees and extension is 70 degrees.  Strength:right wrist flexion and extension strength are 4/5.  Right forearm   supination and pronation 4/5.  Bilateral knee flexion extension 4/5.  Neuro: deferred  Special Tests:deferred  Functional Eval:the patient is unable to maintain single leg stance on the   leg with her eyes closed.           Assessment: Glucophage consistent with continued right upper extremity   weakness status post radial fracture and continued bilateral lower   extremity weakness with significant balance deficits.  Rehab Prognosis:fair  Contraindications/Precautions:none     GOALS  [id number] goals will be met in 3 months.  Increase bilateral upper extremity strength to 5/5 in all planes.  Increase bilateral lower extremity strength to a 5/5 in all planes.  Independent with a home exercise program.     PLAN: Plan and treatment options reviewed with patient.   Recommend treatment freq.  one times a week for 3 months   Treatment to include upper extremity strength exercises, lower extremity   strength exercises, balance training, gait training and home exercise   program progression.        Thank you for the referral of this patient  to [facility]. Please do not hesitate to contact me if I may be of   assistance.     Sincerely,      Noni Saupe, PT, SCS, CSCS  .  Signature   Electronically signed by: Loney Hering  PT,CSCS; 11/09/2010   8:51 AM EST.
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
## 2010-11-29 ENCOUNTER — Ambulatory Visit: Payer: Self-pay | Admitting: Rehabilitative and Restorative Service Providers"

## 2010-12-06 ENCOUNTER — Ambulatory Visit: Payer: Self-pay | Admitting: Rehabilitative and Restorative Service Providers"

## 2010-12-20 ENCOUNTER — Encounter: Payer: Self-pay | Admitting: Rehabilitative and Restorative Service Providers"

## 2010-12-20 ENCOUNTER — Ambulatory Visit: Payer: Self-pay | Admitting: Rehabilitative and Restorative Service Providers"

## 2010-12-20 NOTE — Progress Notes (Signed)
 Sports PT Evaluation   SPORTS REHABILITATION STATUS NOTE     Diagnosis: right distal radius fracture, bilateral lower extremity weakness  Referring MD: Dr. Felipa Evener and Dr. Elmer Bales     History:  the patient is a 49 you'll female referred to physical therapy   for evaluation treatment of her right distal radial fracture and bilateral   lower extremity weakness.  The patient continues to report significant   balance deficits and lower extremity weakness.  She also continues to   report intermittent complaints of right forearm soreness with prolonged use.  Pain:  0/10 complaints of pain at rest.     OBJECTIVE   Observation:  unremarkable  AVW:UJWJX wrist flexion 45 degrees extension 70 degrees.  Strength:right wrist flexion and extension strength 4/5.  Right forearm   supination and pronation 4/5.  Bilateral knee flexion and extension 4/5.  Neuro: deferred  Special Tests:deferred  Functional Eval:the patient continues to demonstrate significant spells   deficits with right single-leg stance.           Assessment: clinical findings are consistent with continued right upper   extremity weakness and continued bilateral lower extremity weakness with   balance deficits.  Rehab Prognosis:fair  Contraindications/Precautions:none     GOALS  [id number] goals were met in 3 months.  Increase bilateral upper extremity strength to 5/5 in all planes.  Increase bilateral lower extremity strength to 5/5 in all planes.  Independent with a home exercise program.     PLAN: Plan and treatment options reviewed with patient.   Recommend treatment freq.  one time per week for 3 months   Treatment to include bilateral upper extremity strengthening, lower trunk   strength exercises, balance training, gait training, and home exercise   program progression.        Thank you for the referral of this patient to [facility]. Please do not hesitate to contact me if I may be of   assistance.     Sincerely,      Noni Saupe, PT, SCS, CSCS  .  Signature   Electronically signed by: Loney Hering  PT,CSCS; 12/20/2010   2:23 PM EST.

## 2010-12-21 ENCOUNTER — Encounter: Payer: Self-pay | Admitting: Primary Care

## 2010-12-21 ENCOUNTER — Ambulatory Visit: Payer: Self-pay | Admitting: Primary Care

## 2010-12-26 ENCOUNTER — Ambulatory Visit: Payer: Self-pay | Admitting: Rehabilitative and Restorative Service Providers"

## 2010-12-27 ENCOUNTER — Ambulatory Visit
Admit: 2010-12-27 | Discharge: 2010-12-27 | Disposition: A | Discharge status: Home / Self Care | Payer: Self-pay | Source: Ambulatory Visit | Attending: Primary Care | Admitting: Primary Care

## 2010-12-27 LAB — LIPID PANEL
Chol/HDL Ratio: 2.4
Cholesterol: 261 mg/dL — AB
HDL: 107 mg/dL
LDL Calculated: 138 mg/dL
Non HDL Cholesterol: 154 mg/dL
Triglycerides: 81 mg/dL

## 2010-12-27 LAB — CBC
Hematocrit: 39 % (ref 34–45)
Hemoglobin: 12.7 g/dL (ref 11.2–15.7)
MCV: 95 fL (ref 79–95)
Platelets: 269 10*3/uL (ref 160–370)
RBC: 4.1 MIL/uL (ref 3.9–5.2)
RDW: 14 % (ref 11.7–14.4)
WBC: 6.6 10*3/uL (ref 4.0–10.0)

## 2010-12-27 LAB — COMPREHENSIVE METABOLIC PANEL
ALT: 31 U/L (ref 0–35)
AST: 20 U/L (ref 0–35)
Albumin: 4.4 g/dL (ref 3.5–5.2)
Alk Phos: 103 U/L (ref 35–105)
Anion Gap: 7 (ref 7–16)
Bilirubin,Total: 0.3 mg/dL (ref 0.0–1.2)
CO2: 32 mmol/L — ABNORMAL HIGH (ref 20–28)
Calcium: 9.3 mg/dL (ref 8.6–10.2)
Chloride: 101 mmol/L (ref 96–108)
Creatinine: 0.65 mg/dL (ref 0.51–0.95)
GFR,Black: >59 *
GFR,Caucasian: >59 *
Glucose: 88 mg/dL (ref 74–106)
Lab: 19 mg/dL (ref 6–20)
Potassium: 4.3 mmol/L (ref 3.3–5.1)
Sodium: 140 mmol/L (ref 133–145)
Total Protein: 7 g/dL (ref 6.3–7.7)

## 2010-12-27 LAB — CARBAMAZEPINE: Carbamazepine: 10.2 ug/mL (ref 4.0–12.0)

## 2010-12-27 LAB — TSH: TSH: 2.39 u[IU]/mL (ref 0.27–4.20)

## 2010-12-29 LAB — VITAMIN D
25-OH VIT D2: <4 ng/mL
25-OH VIT D3: 44 ng/mL
25-OH Vit Total: 44 ng/mL (ref 30–80)

## 2011-01-03 ENCOUNTER — Ambulatory Visit: Payer: Self-pay | Admitting: Rehabilitative and Restorative Service Providers"
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
## 2011-01-17 ENCOUNTER — Ambulatory Visit: Payer: Self-pay | Admitting: Rehabilitative and Restorative Service Providers"

## 2011-01-24 ENCOUNTER — Ambulatory Visit: Payer: Self-pay

## 2011-01-24 ENCOUNTER — Ambulatory Visit: Payer: Self-pay | Admitting: Rehabilitative and Restorative Service Providers"

## 2011-01-31 ENCOUNTER — Ambulatory Visit: Payer: Self-pay | Admitting: Rehabilitative and Restorative Service Providers"

## 2011-02-07 ENCOUNTER — Ambulatory Visit: Payer: Self-pay | Admitting: Rehabilitative and Restorative Service Providers"

## 2011-02-13 ENCOUNTER — Ambulatory Visit
Admit: 2011-02-13 | Discharge: 2011-02-13 | Disposition: A | Discharge status: Home / Self Care | Payer: Self-pay | Source: Ambulatory Visit | Attending: Neurology | Admitting: Neurology

## 2011-02-13 LAB — CARBAMAZEPINE: Carbamazepine: 9.3 ug/mL (ref 4.0–12.0)

## 2011-02-14 ENCOUNTER — Encounter: Payer: Self-pay | Admitting: Rehabilitative and Restorative Service Providers"

## 2011-02-14 ENCOUNTER — Ambulatory Visit: Payer: Self-pay | Admitting: Rehabilitative and Restorative Service Providers"

## 2011-02-15 NOTE — Progress Notes (Signed)
 Sports PT Evaluation   SPORTS REHABILITATION STATUS NOTE     Diagnosis: right distal radius fracture and bilateral lower extremity   weakness  Referring MD: Dr. Felipa Evener and Dr. Elmer Bales     History:  the patient is a 55 year old female referred to physical therapy   for treatment of her right distal radius fracture and bilateral lower   extremity weakness.  At this time she complains of increased left wrist   pain secondary to increased use.  She also reports continued balance   deficits he has significant lower extremity weakness with ADLs.  Pain:  0/10 the point of pain while at rest on this date.     OBJECTIVE   Observation:  ambulation continues to be antalgic with a decreased stance   time on the right lower extremity.  FAO:ZHYQM wrist range of motion remains limited at 45 degrees for flexion   and 70 degrees for extension.  Left wrist supination is 45 degrees and   pronation is 15 degrees.  Strength:right wrist flexion and extension 4/5.  Right forearm supination   and pronation 4/5.  Bilateral knee flexion extension 4/5.  Neuro: deferred  Special Tests:deferred  Functional Eval:deferred           Assessment: clinical findings are consistent with continued right upper   extremity weakness and continued bilateral lower extremity strength   deficits with significant balance deficits.  Rehab Prognosis:fair  Contraindications/Precautions:none     GOALS  VHQ:IONG-EXBM goals will be met in 3 months.  Increase bilateral upper and lower extremity strength to 5/5 in all planes.  Independent with a home exercise program.     PLAN: Plan and treatment options reviewed with patient.   Recommend treatment freq.  one time per week for 3 months   Treatment to include bilateral upper trunk strengthening, bilateral lower   shoulder strengthening, balance training, balance training, in home   exercise program progression.        Thank you for the referral of this patient to [facility]. Please do not  hesitate to contact me if I may be of   assistance.     Sincerely,      Noni Saupe, PT, SCS, CSCS  .  Signature   Electronically signed by: Loney Hering  PT,CSCS; 02/15/2011   8:18 AM EST.

## 2011-02-21 ENCOUNTER — Ambulatory Visit: Payer: Self-pay | Admitting: Rehabilitative and Restorative Service Providers"

## 2011-02-28 ENCOUNTER — Ambulatory Visit: Payer: Self-pay | Admitting: Rehabilitative and Restorative Service Providers"

## 2011-03-07 ENCOUNTER — Ambulatory Visit: Payer: Self-pay | Admitting: Rehabilitative and Restorative Service Providers"

## 2011-03-14 ENCOUNTER — Ambulatory Visit: Payer: Self-pay | Admitting: Rehabilitative and Restorative Service Providers"

## 2011-03-21 ENCOUNTER — Ambulatory Visit: Payer: Self-pay | Admitting: Rehabilitative and Restorative Service Providers"

## 2011-03-22 ENCOUNTER — Ambulatory Visit
Admit: 2011-03-22 | Discharge: 2011-03-22 | Disposition: A | Discharge status: Home / Self Care | Payer: Self-pay | Source: Ambulatory Visit | Attending: Gastroenterology | Admitting: Gastroenterology

## 2011-03-24 LAB — SURGICAL PATHOLOGY

## 2011-03-28 ENCOUNTER — Ambulatory Visit: Payer: Self-pay | Admitting: Rehabilitative and Restorative Service Providers"

## 2011-03-28 ENCOUNTER — Encounter: Payer: Self-pay | Admitting: Rehabilitative and Restorative Service Providers"

## 2011-03-28 NOTE — Progress Notes (Signed)
 Sports PT Evaluation   SPORTS REHABILITATION STATUS NOTE     Diagnosis: right distal radius fracture and bilateral lower extremity   weakness  Referring MD: Dr. Felipa Evener and Dr. Elmer Bales     History:  the patient is a 55 year old female referred to physical therapy   for treatment of her right distal radius fracture and bilateral lower   extremity weakness.  The patient continues to report significant balance   deficits with ambulation.  Her complaints of bilateral wrist pain are less   at this time.  Pain:  0/10 complaints of pain at rest.     OBJECTIVE   Observation:  unremarkable  NFA:OZHYQ wrist flexion 45 degrees and extension 70 degrees.  Left wrist   supination 45 degrees and pronation 50 degrees  Strength:left grip strength 30 pounds and right grip strength 40 pounds.  Neuro: deferred  Special Tests:deferred  Functional Eval:deferred           Assessment: clinical findings are consistent with continued significant   balance deficits and complaints of bilateral wrist tightness and limited   function  Rehab Prognosis:fair  Contraindications/Precautions:none     GOALS  MVH:QION-GEXB goals will be met in 3 months.  Increased bilateral upper and lower extremity strength to a 5/5 in all   planes.  Independent with a home exercise program.  ambulate without significant complaints of balance deficits.           PLAN: Plan and treatment options reviewed with patient.   Recommend treatment freq.  one time per week for 3 months   Treatment to include gait training, bilateral upper extremity   strengthening, bilateral lower extremity strengthening, balance training   and home exercise program progression.        Thank you for the referral of this patient to [employer]. Please do not hesitate to contact me if I may be of   assistance.     Sincerely,      Noni Saupe, PT, SCS, CSCS  .  Signature   Electronically signed by: Loney Hering  PT,CSCS; 03/28/2011   3:08 PM EST.

## 2011-04-03 ENCOUNTER — Telehealth: Payer: Self-pay | Admitting: Primary Care

## 2011-04-03 ENCOUNTER — Encounter: Payer: Self-pay | Admitting: Gastroenterology

## 2011-04-03 ENCOUNTER — Ambulatory Visit: Payer: Self-pay | Admitting: Physical Therapy

## 2011-04-03 MED ORDER — NON-SYSTEM MEDICATION *A*
Status: DC
Start: 2011-04-03 — End: 2014-01-30

## 2011-04-03 NOTE — Telephone Encounter (Signed)
 [facility] called asking for an req/rx for physical therapy. I will fax over as soon as you have it. Thanks!

## 2011-04-03 NOTE — Telephone Encounter (Signed)
 I read through the Physical Therapists note and it shows she went to PT today for back pain (Visit note in patients chart). Thanks!

## 2011-04-03 NOTE — Progress Notes (Signed)
 PT SPINE EVALUATION      Subjective    [name] is a 55 y.o. female who is present today for back pain.  Mechanism of injury/history of symptoms: stress from death in family, needing new bed, new car. Patients believes much of her symptoms are due deconditioning secondary several injury episodes.     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  Stresses/physical demands of home: Administrator, arts  Sport(s): Other:  NA  Diagnostic tests: None recently   Other: NA    Symptom location: Posterior, bilaterally  Relevant symptoms: Aching, Pain   Symptom frequency: Intermittent  Symptom intensity (0 - 10 scale): Now 1 Best 0 Worst 5   Night Pain: no      Symptoms worsen with: Prolonged standing, shopping  Symptoms improve with: Rest, Ice, Heat, movement  Assistive device:  none  Patient's goals for therapy: Increase strength, Independent with home porgram    Objective:  Observation: Patient was able to ambulate into the department with a antalgic, decreased cadence gait pattern.  The patient was able to perform basic bed mobility independent.    Movement Screen: The patient was able to perform heel walking and was able to perform toe walking during the examination.       Palpation:  N/T    ROM Loss    Flexion: minor loss  Extension:  moderate loss  Left Sidebend: minor loss  Right Sidebend:  minor loss  Left Rotation:  minor loss  Right Rotation:  minor loss  Repeated Movement Testing:  NT   Neuromuscular Assessment: Not Tested     Test:     Lumbar:  Straight Leg Raise,  Right LE  negative, Left LE   negative                                 Lower Quarter/Upper Quarter Screen:  Lower Quarter Screen WNL     Flexibility:   Hamstring:  Minor tightness  Quads:  Minor tightness  Psoas:  Minor tightness    Comments:  Chronic deconditioning    LUMBAR SPINE SCREEN      McGill Stage:  Corrective exercise, Core and whole body  stability    Postural correction test:    Sitting Correction: symptoms reduced:  NT    Standing Correction: symptoms reduced:  NT    Directional Preference:  Neutral    Isometric Tests/Motor Control:    Ability to brace abdomen in sitting and supine:  Abdominal Activation: yes  Drawing in abdomen:  no  Neutral spine:  yes  Ability to isometrically contract glutes in sitting/supine:  Glute activation:  yes  Hamstring contraction:  no  Neutral spine:  yes    Squat Assessment:  Double leg Squat:  Abdominal wall:  NT  Glute med/max:   NT  Hamstrings:  NT  Neutral Spine:  NT    Dynamic Stability Test:    Plank Position:  NT  Results:  NT    Remedial Side Plank:    Results:  NT    Right Side Plank Position:  NT  Results:  NT    Left Side Plank:  NT  Results:  NT    Bridge  Glutes:  NT  Hamstring:  NT  Abdominal wall:  NT    Neurodynamic Assessment:    Seated slump  Right:  N/T  Left:  N/T    Standing lumbar flexion with cervical flexion:  Right:  N/T  Left:  N/T    Prone Knee bend:  Right:  negative  Left:  negative    SLR with sensitizing maneuver as needed:  Right:  negative  Left:  negative    [name]      SPINE ASSESSMENT    Assessment:   Findings consistent with 55 y.o. female with back pain with patient exhibiting pain, strength limitations, functional limitations    Prognosis:  Fair      Contraindications/Precautions/Limitation:  Per diagnosis and gentle progression  Short Term Goals:  Decrease c/o pain by 50% and Minimal assistance with HEP/ education concepts  Long Term Goals:  Walk community distances without increased symptoms, Stand as long as needed when completing ADL's without increased symptoms, Sit as long as needed with ADL's without increased symptoms, Drive automobile without increased symptoms, Transfer from sit to stand and perform bed mobility without increased symptoms    Treatment Plan:     Patient/family involved in developing goals and treatment plan: yes  Freq 1 times per week(s) for 6-8  week(s)    Treatment plan inclusive of:   Exercise: Stretching, Progressive Resistive   Manual Techniques:  None   Modalities:  Cold pack, Interferential, Moist heat   Functional: Functional rehab    Patient was educated on how the principles of the Active Spine Program will benefit their condition.  Patient was educated on the benefits of maintaining an active lifestyle. The patient was instructed on core strengthening exercises for spine support and stability; flexibility exercise for the upper and lower extremities to reduce movement stress to the spine; and aerobic exercise for weight management and endurance.  The patient was also educated on proper posture and body mechanics to avoid painful movements and to work on any directional preference positions.  Modalities for pain control, and traction will be used as necessary to benefit the patients symptoms.        Patient was instructed how to gradually progress their exercise routine within their range of tolerable symptoms.  Patient was advised that maintenance and consistency with the exercise routine at home is key to success.  And that it is critical that they notify their therapist of any prescribed activity that increases their symptoms so that the routine may be modified and progressed.      Thank you for the referral of this patient to the [facility] Active Spine Program.    HEP: Ab/Gulte bracing L1&L2, sitting & standing march, aerobic exercise (TM, walking, bike)    Sincerely,  [name]          [name]

## 2011-04-04 NOTE — Telephone Encounter (Signed)
 I didn't see the RX in your outbox? Also, she intinally went to [facility] for [facility] physical therapy; so that's one RX. Then it looks like she is asking for them to do orthotics as well; I think that might be a separate RX for that as well. Sorry if this is confusing!! Please let me know if you have questions!! Thank you!

## 2011-04-06 NOTE — Telephone Encounter (Signed)
 I think we sent something for PT, is this for her orthotics?

## 2011-04-06 NOTE — Telephone Encounter (Signed)
 I have yet to talk to Dr. Elmer Bales about both of her RX's she needs. I will let you know! Thanks!!

## 2011-04-06 NOTE — Telephone Encounter (Signed)
 When you get a moment I just wanted to touch base on the status RX/Req(s) for this PT. Thanks!

## 2011-04-10 ENCOUNTER — Encounter: Payer: Self-pay | Admitting: Gastroenterology

## 2011-04-11 ENCOUNTER — Encounter: Payer: Self-pay | Admitting: Rehabilitative and Restorative Service Providers"

## 2011-04-16 ENCOUNTER — Other Ambulatory Visit: Payer: Self-pay | Admitting: Primary Care

## 2011-04-18 ENCOUNTER — Ambulatory Visit: Payer: Self-pay | Admitting: Rehabilitative and Restorative Service Providers"

## 2011-04-18 DIAGNOSIS — M84376A Stress fracture, unspecified foot, initial encounter for fracture: Secondary | ICD-10-CM

## 2011-04-18 DIAGNOSIS — S5290XA Unspecified fracture of unspecified forearm, initial encounter for closed fracture: Secondary | ICD-10-CM

## 2011-04-18 NOTE — Progress Notes (Signed)
 Sports PT Daily Note    CATHERYNE DEFORD   [id number]   Encounter Diagnoses   Name Primary?   . Stress fracture of the metatarsals Yes   . Closed fracture of unspecified part of radius with ulna          Subjective:  Pain Score:  0  Pain: Unchanged, reports B wrist tightness    Objective:  ROM - Unchanged, Bilateral, Wrist  Strength - Ther Ex per flowsheet  Function: - Unchanged  Education:  Verbal cues for ther ex    Objective       Manual stretching wrist flex, ext, sup, pronation 5 min       Gait training    Ability ladder 8 lengths   Hurdle step overs  20x           Rockerboard lateral weight shifts 30x   Leg Press R 2.5p, L 3.5p  3x10 each       Rice bucket finger flex/ext 30x   Rice bucket wrist sup/pro 30x   Flexbar wrist flex/ext 30x   Flexbar wrist pro/sup 30x   Dumbbell wrist pro/sup 30x 5lbs                   Treatment Modalities:  NA    Assessment:  Patient responding favorably    Plan of Care:  Continue per Plan of care -  As written    Thank you for referring this patient to [facility] Spine [facility]    [employer]

## 2011-04-25 ENCOUNTER — Ambulatory Visit: Payer: Self-pay | Admitting: Rehabilitative and Restorative Service Providers"

## 2011-04-25 DIAGNOSIS — S5290XA Unspecified fracture of unspecified forearm, initial encounter for closed fracture: Secondary | ICD-10-CM

## 2011-04-25 DIAGNOSIS — M84376A Stress fracture, unspecified foot, initial encounter for fracture: Secondary | ICD-10-CM

## 2011-04-25 NOTE — Progress Notes (Signed)
 Sports PT Daily Note    [name]   [id number]   Encounter Diagnoses   Name Primary?   . Stress fracture of the metatarsals Yes   . Closed fracture of unspecified part of radius with ulna          Subjective:  Pain Score:  0  Pain: Unchanged    Objective:  ROM - Unchanged, Bilateral, Wrist  Strength - Unchanged  Function: - Improved, balance with gait training on this date  Education:  Verbal cues for ther ex    Objective       Manual stretching wrist flex, ext, sup, pronation  5 min        Gait training     Agility ladder side steps  4 lengths   Agility ladder  8 lengths    Hurdle step overs  20x            Rockerboard lateral weight shifts  30x    Leg Press  R 2.5p, L 3.5p 3x10 each        Rice bucket finger flex/ext  30x    Rice bucket wrist sup/pro  30x    Flexbar wrist flex/ext  30x    Flexbar wrist pro/sup  30x    Dumbbell wrist pro/sup  30x 5lbs                        Treatment Modalities:  NA    Assessment:  Patient responding favorably    Plan of Care:  Continue per Plan of care -  As written    Thank you for referring this patient to [facility] Spine [facility]    [employer]

## 2011-04-28 ENCOUNTER — Ambulatory Visit: Payer: Self-pay | Admitting: Physical Therapy

## 2011-05-02 ENCOUNTER — Ambulatory Visit: Payer: Self-pay | Admitting: Rehabilitative and Restorative Service Providers"

## 2011-05-02 ENCOUNTER — Ambulatory Visit: Payer: Self-pay | Admitting: Physical Therapy

## 2011-05-03 ENCOUNTER — Other Ambulatory Visit: Payer: Self-pay | Admitting: Primary Care

## 2011-05-03 MED ORDER — LEVOTHYROXINE SODIUM 25 MCG PO TABS *I*
ORAL_TABLET | ORAL | Status: DC
Start: 2011-05-03 — End: 2012-01-31

## 2011-05-09 ENCOUNTER — Ambulatory Visit: Payer: Self-pay | Admitting: Rehabilitative and Restorative Service Providers"

## 2011-05-09 DIAGNOSIS — M84376A Stress fracture, unspecified foot, initial encounter for fracture: Secondary | ICD-10-CM

## 2011-05-09 DIAGNOSIS — S5290XA Unspecified fracture of unspecified forearm, initial encounter for closed fracture: Secondary | ICD-10-CM

## 2011-05-09 NOTE — Progress Notes (Signed)
Sports PT Daily Note    [name]   [id number]   Encounter Diagnoses   Name Primary?   . Stress fracture of the metatarsals Yes   . Closed fracture of unspecified part of radius with ulna          Subjective:  Pain Score:  0  Pain: Unchanged    Objective:  ROM - Unchanged, Bilateral, Wrist  Strength - Ther Ex per flowsheet  Function: - Worsened, multiple VC for gait training   Education:  Verbal cues for ther ex    Objective       Manual stretching wrist flex, ext, sup, pronation  5 min        Gait training     Agility ladder side steps  4 lengths    Agility ladder  8 lengths    Hurdle step overs  20x            Rockerboard lateral weight shifts  30x    Leg Press  R 2.5p, L 3.5p 3x10 each        Rice bucket finger flex/ext  30x    Rice bucket wrist sup/pro  30x    Flexbar wrist flex/ext  30x    Flexbar wrist pro/sup  30x    Dumbbell wrist pro/sup  30x 5lbs                          Treatment Modalities:  NA    Assessment:  Patient responding favorably    Plan of Care:  Continue per Plan of care -  As written    Thank you for referring this patient to [facility] Sp[facility]    [employer]

## 2011-05-18 ENCOUNTER — Other Ambulatory Visit: Payer: Self-pay | Admitting: Neurology

## 2011-05-19 ENCOUNTER — Encounter: Payer: Self-pay | Admitting: Gastroenterology

## 2011-05-22 ENCOUNTER — Other Ambulatory Visit: Payer: Self-pay | Admitting: Neurology

## 2011-05-23 ENCOUNTER — Ambulatory Visit: Payer: Self-pay | Admitting: Rehabilitative and Restorative Service Providers"

## 2011-05-30 ENCOUNTER — Ambulatory Visit: Payer: Self-pay | Admitting: Rehabilitative and Restorative Service Providers"

## 2011-05-30 DIAGNOSIS — S5290XA Unspecified fracture of unspecified forearm, initial encounter for closed fracture: Secondary | ICD-10-CM

## 2011-05-30 DIAGNOSIS — M84376A Stress fracture, unspecified foot, initial encounter for fracture: Secondary | ICD-10-CM

## 2011-05-30 NOTE — Progress Notes (Signed)
Sports PT Daily Note    [name]   [id number]   Encounter Diagnoses   Name Primary?   . Stress fracture of the metatarsals Yes   . Closed fracture of unspecified part of radius with ulna          Subjective:  Pain Score:  0  Pain: Unchanged    Objective:  ROM - Unchanged, Bilateral, Wrist  Strength - Ther Ex per flowsheet  Function: - Unchanged  Education:  Verbal cues for ther ex, Manual cues for ther ex    Objective       Manual stretching wrist flex, ext, sup, pronation  5 min        Gait training     Agility ladder side steps  4 lengths    Agility ladder  8 lengths    Hurdle step overs             Rockerboard lateral weight shifts  30x    Leg Press  R 2.5p, L 3.5p 3x10 each        Rice bucket finger flex/ext  30x    Rice bucket wrist sup/pro  30x    Flexbar wrist flex/ext  30x    Flexbar wrist pro/sup  30x    Dumbbell wrist pro/sup  30x 5lbs                      Treatment Modalities:  NA    Assessment:  Patient responding favorably    Plan of Care:  Continue per Plan of care -  As written    Thank you for referring this patient to [facility] Sp[facility] Rehabilitation    [employer]

## 2011-06-02 ENCOUNTER — Ambulatory Visit: Payer: Self-pay

## 2011-06-02 ENCOUNTER — Ambulatory Visit: Payer: Self-pay | Admitting: Rehabilitative and Restorative Service Providers"

## 2011-06-02 DIAGNOSIS — M549 Dorsalgia, unspecified: Secondary | ICD-10-CM

## 2011-06-02 NOTE — Progress Notes (Signed)
SPINE REHABILITATION RE-EVALUATION    History  Diagnosis:   Encounter Diagnosis   Name Primary?   . Back pain Yes       Referring MD: Dr. Theodis Shove    Subjective    [name] is a 55 y.o. female who is receiving treatment for low back pain Mechanism of injury/history of symptoms: multiple injuries/home stresses, longterm issue.     Pt has received care for 1 visits to date.  Attendance:  unable to attend since initial evaluation   Compliance:  Good, compliant with HEP  Symptom location: Central and low back, mid back , and numbness into right leg and arm  Relevant symptoms: Aching, Numbness, Pain , weakness  Symptom frequency: Intermittent  Symptom intensity (0 - 10 scale): Now 2 Best 0 Worst 3  Patient's goals for therapy:  Increase strength, Independent with home porgram     Objective:    Palpation:  N/T  ROM Loss    Flexion: minor loss  Extension:  moderate loss  Left Sidebend: minor loss  Right Sidebend:  minor loss  Left Rotation:  minor loss  Right Rotation:  minor loss  Repeated Movement Testing:  N/A   Neuromuscular Assessment: Not Tested              Strength: Patient Performing Level one spine exercise, generally deconditioned    McGill Stage: Corrective exercise, Core and whole body stability    Comments:  generally deconditioned with multiple complicating injuries    SPINE ASSESSMENT    Assessment:   Findings consistent with 55 y.o. female with   Encounter Diagnosis   Name Primary?   . Back pain Yes    with pain, ROM limitations, strength limitations, functional limitations    Prognosis:  Fair      Contraindications/Precautions/Limitation:  Per diagnosis and gentle progression  Short Term Goals:  Decrease c/o pain by 50% and Minimal assistance with HEP/ education concepts  Long Term Goals:  Walk community distances without increased symptoms, Stand as long as needed when completing ADL's without increased symptoms, Sit as long as needed with ADL's without increased symptoms, Drive automobile  without increased symptoms, Transfer from sit to stand and perform bed mobility without increased symptoms    Treatment Plan:     Patient/family involved in developing goals and treatment plan: yes  Freq 1 times per week(s) for 6-8 week(s)    Treatment plan inclusive of:   Exercise: Stretching, Progressive Resistive, Aerobic exercise   Manual Techniques:  N/A   Modalities:  as indicated   Functional: Proprioception/Dynamic stability, Functional rehab    Patient was educated on how the principles of the Active Spine Program will benefit their condition.  Patient was educated on the benefits of maintaining an active lifestyle. The patient was instructed on core strengthening exercises for spine support and stability; flexibility exercise for the upper and lower extremities to reduce movement stress to the spine; and aerobic exercise for weight management and endurance.  The patient was also educated on proper posture and body mechanics to avoid painful movements and to work on any directional preference positions.  Modalities for pain control, and traction will be used as necessary to benefit the patients symptoms.        Patient was instructed how to gradually progress their exercise routine within their range of tolerable symptoms.  Patient was advised that maintenance and consistency with the exercise routine at home is key to success.  And that it is critical that  they notify their therapist of any prescribed activity that increases their symptoms so that the routine may be modified and progressed.      Thank you for the referral of this patient to the [facility].    Sincerely,  Baxter Kail    Exercise Repetitions   Ab/glute brace x15 each   Seated marches x15   Standing marches x15   Table plank Unable secondary to ankle issues   Curl-up x10   Bridge 5"x10   Recumbent bike 3'

## 2011-06-06 ENCOUNTER — Ambulatory Visit: Payer: Self-pay | Admitting: Rehabilitative and Restorative Service Providers"

## 2011-06-06 ENCOUNTER — Ambulatory Visit: Payer: Self-pay | Admitting: Neurology

## 2011-06-06 ENCOUNTER — Encounter: Payer: Self-pay | Admitting: Neurology

## 2011-06-06 VITALS — BP 137/65 | HR 76 | Ht 63.0 in | Wt 120.6 lb

## 2011-06-06 DIAGNOSIS — M84376A Stress fracture, unspecified foot, initial encounter for fracture: Secondary | ICD-10-CM

## 2011-06-06 DIAGNOSIS — S5290XA Unspecified fracture of unspecified forearm, initial encounter for closed fracture: Secondary | ICD-10-CM

## 2011-06-06 DIAGNOSIS — R569 Unspecified convulsions: Secondary | ICD-10-CM

## 2011-06-06 NOTE — Patient Instructions (Signed)
You were seen today in follow up of your history of seizures.    No recent events.    Will check carbamazepine level now and have standing order renewed.    F/U 6 months

## 2011-06-06 NOTE — Progress Notes (Signed)
Sports PT Daily Note    STACE NEAS   [id number]   Encounter Diagnoses   Name Primary?   . Stress fracture of the metatarsals Yes   . Closed fracture of unspecified part of radius with ulna          Subjective:  Pain Score:  0  Pain: complains of mainly B wrist soreness    Objective:  ROM - Unchanged, Bilateral, Ankle  Strength - Ther Ex per flowsheet  Function: - Improved, improved balance with balance in stride  Education:  Verbal cues for ther ex    Objective       Manual stretching wrist flex, ext, sup, pronation  5 min        Gait training     Agility ladder side steps  4 lengths    Agility ladder  8 lengths    Hurdle step overs             Rockerboard lateral weight shifts  30x    Leg Press  R 2.5p, L 3.5p 3x10 each        Rice bucket finger flex/ext  30x    Rice bucket wrist sup/pro  30x    Flexbar wrist flex/ext  30x    Flexbar wrist pro/sup  30x    Dumbbell wrist pro/sup  30x 5lbs                      Treatment Modalities:  NA    Assessment:  Patient responding favorably    Plan of Care:  Continue per Plan of care -  As written    Thank you for referring this patient to [facility]    [employer]

## 2011-06-07 NOTE — Progress Notes (Signed)
I had the pleasure of seeing [name] today along with her husband for followup of her seizure disorder.  Overall she's been doing well since her last visit.  In fact, she states that over the last several months she has been more alert.  There is no clear change in medication or other medical issues that have contributed to this increased alertness.  She believes however she has been sleeping better.  She also states that her father died recently.  She wonders if this may have shocked her into being more alert as well.    She has had no recurrent seizures and in general has been tolerating her medications well.    Her review of systems is notable for heartburn which is precipitated by intake of certain foods.  She has had some epigastric pain and cramping.  She has had no recent falls or joint problems.  She also reports no shortness of breath, chest discomfort, abdominal pain, nausea vomiting, or other skin changes.    Examination: She is alert and oriented.  Her speech is clear.  She appears comfortable today.  Her affect appears appropriate.  There are no cognitive issues.  Cranial nerves: Versions are full with no nystagmus.  Visual fields are full to confrontation.  She has no facial weakness.  Motor examination reveals full strength throughout.  She continues to have a mild sustension tremor, worse on the right than the left.  There is no appendicular ataxia.  She walks a bit tentatively with a slightly widened base but is otherwise independent.  She is receiving physical therapy once weekly for her balance.    Impression: History of intracerebral AVM and residual seizure disorder.  She has had no recurrent seizures in the last number of years.  She is wondering if her recent level of improved alertness correlates with her Tegretol level.  I will obtain a repeat level now and I provided her with a standing order over the next 6 months.  We again discussed the possible options regarding converting her  Tegretol to another anticonvulsant.  Lamictal may have the best side effect profile for her.  We will hold on making any changes at this time.  I plan seeing her again in 6 months.  I have asked her to call me once she has her blood level drawn and we can discuss.

## 2011-06-12 ENCOUNTER — Ambulatory Visit
Admit: 2011-06-12 | Discharge: 2011-06-12 | Payer: Self-pay | Source: Ambulatory Visit | Attending: Neurology | Admitting: Neurology

## 2011-06-12 DIAGNOSIS — R569 Unspecified convulsions: Secondary | ICD-10-CM

## 2011-06-13 ENCOUNTER — Ambulatory Visit
Admit: 2011-06-13 | Discharge: 2011-06-13 | Disposition: A | Discharge status: Home / Self Care | Payer: Self-pay | Source: Ambulatory Visit | Attending: Neurology | Admitting: Neurology

## 2011-06-13 ENCOUNTER — Ambulatory Visit: Payer: Self-pay | Admitting: Rehabilitative and Restorative Service Providers"

## 2011-06-13 DIAGNOSIS — R569 Unspecified convulsions: Secondary | ICD-10-CM

## 2011-06-13 DIAGNOSIS — M84376A Stress fracture, unspecified foot, initial encounter for fracture: Secondary | ICD-10-CM

## 2011-06-13 DIAGNOSIS — S5290XA Unspecified fracture of unspecified forearm, initial encounter for closed fracture: Secondary | ICD-10-CM

## 2011-06-13 NOTE — Progress Notes (Signed)
Sports PT Daily Note    AMMI PAVLIK   [id number]   Encounter Diagnoses   Name Primary?   . Stress fracture of the metatarsals Yes   . Closed fracture of unspecified part of radius with ulna          Subjective:  Pain Score:  0  Pain: Unchanged    Objective:  ROM - Unchanged, Bilateral, Wrist  Strength - Ther Ex per flowsheet  Function: - Improved balance with gait training.  Added marching  Education:  Verbal cues for ther ex    Objective       Manual stretching wrist flex, ext, sup, pronation  5 min        Gait training     Agility ladder side steps  4 lengths    Agility ladder  8 lengths    Hurdle step overs     Marching 4 lengths       Rockerboard lateral weight shifts  30x    Leg Press  R 2.5p, L 3.5p 3x10 each        Rice bucket finger flex/ext  30x    Rice bucket wrist sup/pro  30x    Flexbar wrist flex/ext  30x    Flexbar wrist pro/sup  30x    Dumbbell wrist pro/sup  30x 5lbs                      Treatment Modalities:  NA    Assessment:  Patient responding favorably    Plan of Care:  Continue per Plan of care -  As written    Thank you for referring this patient to [facility]    [employer]

## 2011-06-14 LAB — CARBAMAZEPINE, FREE & TOTAL
Carbamazepine, Free: 25.7 % (ref 8.0–35.0)
Carbamazepine,Free: 2.7 ug/mL (ref 1.0–3.0)
Carbamazepine,Total: 10.5 ug/mL (ref 4.0–12.0)

## 2011-06-15 ENCOUNTER — Other Ambulatory Visit: Payer: Self-pay | Admitting: Primary Care

## 2011-06-15 ENCOUNTER — Ambulatory Visit: Payer: Self-pay

## 2011-06-15 DIAGNOSIS — J309 Allergic rhinitis, unspecified: Secondary | ICD-10-CM

## 2011-06-23 ENCOUNTER — Ambulatory Visit: Payer: Self-pay

## 2011-06-23 DIAGNOSIS — M549 Dorsalgia, unspecified: Secondary | ICD-10-CM

## 2011-06-23 NOTE — Progress Notes (Signed)
Sports PT Daily Note    [name]   [id number]   Encounter Diagnosis   Name Primary?   . Back pain Yes         Subjective:  Pain Score:  1  Pain: not bad today    Objective:  ROM - NT this visit  Strength - Improved,  Per Ther Ex flowsheet  Function: - Unchanged  Education:  Updated HEP, Added clamshell    Objective         Treatment Modalities:  NA    Assessment:  Patient responding favorably, Continue present therapy treatment    Plan of Care:  Continue per Plan of care -  As written    Thank you for referring this patient to [facility] Sp[facility]    [employer]    Exercise Repetitions   Ab/glute brace x30 each   Seated marches x30   Standing marches x30   Table plank Unable secondary to ankle issues   Curl-up x15   Bridge 5"x15   Recumbent bike 3'   Clamshell x10

## 2011-06-26 ENCOUNTER — Ambulatory Visit: Payer: Self-pay | Admitting: Rehabilitative and Restorative Service Providers"

## 2011-06-26 DIAGNOSIS — M84376A Stress fracture, unspecified foot, initial encounter for fracture: Secondary | ICD-10-CM

## 2011-06-26 DIAGNOSIS — S5290XA Unspecified fracture of unspecified forearm, initial encounter for closed fracture: Secondary | ICD-10-CM

## 2011-06-26 NOTE — Progress Notes (Signed)
SPORTS REHABILITATION PT LE STATUS    History  Diagnosis:   Encounter Diagnoses   Name Primary?   . Stress fracture of the metatarsals Yes   . Closed fracture of unspecified part of radius with ulna      Subjective    JASLEEN WILLWERTH is a 55 y.o. female who is present today for treatment of her B wrists and B LE weakness with gait disturbances.    Pt has received care for 25 visits to date.  Attendance:  Good    Compliance:  Good   Relevant symptoms: Decreased ROM, Decreased strength, balance deficits    Symptom frequency: Intermittent  Symptom intensity (0 - 10 scale): Now 0 Best 0 Worst 0    Patient's goals for therapy: Increase ROM, Increase strength     Objective:    Observation: WNL   Palpation:  WNL     ROM/Strength:    bilateral leg wrists           LEFT   RIGHT   Strength    PROM AROM PROM AROM Left Right   Knee flex     4/5 4/5   Knee ext     4/5 4/5            Ankle PF     3-/5 3-/5   Ankle DF     4/5 4/5            Wrist flex     4/5 4/5   Wrist ext     4/5 4/5          Assessment  Findings consistent with 55 y.o., female with   Encounter Diagnoses   Name Primary?   . Stress fracture of the metatarsals Yes   . Closed fracture of unspecified part of radius with ulna      with ROM limitations, strength limitations, functional limitations    Prognosis:  Fair    Contraindications/Precautions/Limitation:  Per diagnosis  Long Term Goals:  Restoration of functional strength, Independent with HEP/education , Functional return to ADLs / activites without limitation        Treatment Plan:   Patient/family involved in developing goals and treatment plan: yes  Freq 1 times per week(s) for 3 month(s)     Exercise: AROM, Stretching, Progressive Resistive   Manual Techniques:  N/A   Modalities:  NA    Functional: Functional rehab , gait training    Thank you for referring this patient to [employer] and Spine Rehabilitation.    Geary Rufo    Manual stretching wrist flex, ext, sup, pronation  5 min         Gait training     Agility ladder side steps  4 lengths    Agility ladder  8 lengths    Hurdle step overs  4 lengths   Marching          Rockerboard lateral weight shifts  30x    Leg Press  R 2.5p, L 3.5p 3x10 each        Rice bucket finger flex/ext  30x    Rice bucket wrist sup/pro  30x    Flexbar wrist flex/ext  30x    Flexbar wrist pro/sup  30x    Dumbbell wrist pro/sup  30x 5lbs

## 2011-06-27 ENCOUNTER — Encounter: Payer: Self-pay | Admitting: Rehabilitative and Restorative Service Providers"

## 2011-06-30 ENCOUNTER — Encounter: Payer: Self-pay | Admitting: Orthopedic Surgery

## 2011-06-30 ENCOUNTER — Ambulatory Visit: Payer: Self-pay | Admitting: Orthopedic Surgery

## 2011-06-30 VITALS — BP 146/76 | Ht 63.0 in | Wt 120.0 lb

## 2011-06-30 DIAGNOSIS — R52 Pain, unspecified: Secondary | ICD-10-CM

## 2011-06-30 DIAGNOSIS — S8390XA Sprain of unspecified site of unspecified knee, initial encounter: Secondary | ICD-10-CM

## 2011-06-30 NOTE — Progress Notes (Signed)
CHIEF COMPLAINT: Right knee injury    HISTORY OF PRESENT ILLNESS: This patient is a 55 year old female who presents to urgent care today with chief complaint of an injury to her right knee suffered last evening.  She states she was standing up from a chair when the chair gave way causing her to fall on her right side landing on her right buttock and twisting her previously normal right knee.  Patient is status post a ruptured AVM and did have a stroke back in 2004.  She describes at baseline some weakness in her right lower extremity but no previous pain.  She does have significant osteoporosis    PAST MEDICAL HISTORY: Significant for previous stroke, hypertension, previous musculoskeletal injuries    PAST SURGICAL HISTORY: Previous wrist surgeries    MEDICATIONS:  See Medication List    ALLERGIES:  See Allergy List    SOCIAL HISTORY: Patient is married.  She does not smoke, drink alcohol, or use drugs.    FAMILY HISTORY: Patient indicates that her mother is alive and well.  Her father has died secondary to leukemia    REVIEW OF SYSTEMS:  Review of Gastointestinal, Genitourinary, Neurologic, Integument, Vascular, Hematologic, Lymphatic, Cardiac, Pulmonary and Endocrine systems reveal the following: Significant for colitis, frequent indigestion, urinary frequency, tingling in the arms and legs, easy bruising, wheezing.    PHYSICAL EXAM: A frail-appearing female.  Her right knee does not show any increased warmth or erythema.  She has no palpable effusion.  She is diffusely tender to even light palpation.  She is able to actively extend her knee to just short of full extension.  She tolerates flexion to roughly 90.  She has some vague tenderness along the medial patella retinaculum.  There is no significant tenderness medially or laterally at the joint line.  She has no instability or pain with varus or valgus stress.  Her right hip appears to move comfortably through full range of motion and is not reproduce any of  her usual discomfort.    IMAGING: 4 views of her right knee were taken today and these do not show any obvious acute bony normality.  She does have osteoporosis.  She has mild degenerative change.    ASSESSMENT AND DIAGNOSIS: Right knee sprain    PLAN: Patient was placed into a neoprene hinged knee brace for support.  We will also attempt to get a walker for her to assist with ambulation she cannot use a cane due to her previous wrist surgeries.  She'll continue to ice and elevate the knee.  We'll see her back in 2 weeks for progress check.  She'll call prior to that appointment with any problems or questions.    ORDERS TODAY: Right knee x-rays    ORDERS NEXT VISIT:    PERCENT OF TEMPORARY IMPAIRMENT:

## 2011-07-04 ENCOUNTER — Ambulatory Visit: Payer: Self-pay | Admitting: Rehabilitative and Restorative Service Providers"

## 2011-07-07 ENCOUNTER — Ambulatory Visit: Payer: Self-pay

## 2011-07-11 ENCOUNTER — Ambulatory Visit: Payer: Self-pay | Admitting: Rehabilitative and Restorative Service Providers"

## 2011-07-12 ENCOUNTER — Ambulatory Visit: Payer: Self-pay | Admitting: Orthopedic Surgery

## 2011-07-14 ENCOUNTER — Ambulatory Visit: Payer: Self-pay | Admitting: Orthopedic Surgery

## 2011-07-14 ENCOUNTER — Encounter: Payer: Self-pay | Admitting: Orthopedic Surgery

## 2011-07-14 VITALS — BP 122/72 | Ht 63.0 in | Wt 120.0 lb

## 2011-07-14 DIAGNOSIS — IMO0002 Reserved for concepts with insufficient information to code with codable children: Secondary | ICD-10-CM

## 2011-07-14 NOTE — Progress Notes (Signed)
CHIEF COMPLAINT: Followup right knee    INTERVAL HISTORY: Patient returns noting definite improvement in her symptoms of right knee pain following her sprain from 2 weeks ago.  She is using a walker and her hinged knee brace.  She is looking to wean from both of these.    PFSH:  Since last visit no change.    ROS:  Since last visit no change.    MEDICATION:  Since last visit no new meds.    ALLERGIES:  As per initial evaluation.    PHYSICAL EXAM: Her right knee does not show any localized soft tissue swelling or effusion.  She has no tenderness to palpation.  She can fully extend the knee actively and flexes release 130.  She has no instability or pain with varus or valgus stress.    IMAGING:    ASSESSMENT: Improve symptoms of her right knee sprain    PLAN: Patient will continue to wean from her brace and walker.  She would prefer to wean from the brace first given her difficulty putting this on and taking it off due to her wrist problems.  She'll also begin therapy at the sports module downstairs where she is already patient for other problems.  She'll follow up with [place] at this point only if her symptoms persist.    ORDERS TODAY:    ORDERS NEXT VISIT:    PERCENT OF TEMPORARY IMPAIRMENT:

## 2011-07-18 ENCOUNTER — Ambulatory Visit: Payer: Self-pay | Admitting: Rehabilitative and Restorative Service Providers"

## 2011-07-18 DIAGNOSIS — M25569 Pain in unspecified knee: Secondary | ICD-10-CM

## 2011-07-18 DIAGNOSIS — M84376A Stress fracture, unspecified foot, initial encounter for fracture: Secondary | ICD-10-CM

## 2011-07-18 NOTE — Progress Notes (Signed)
PT SPORTS REHABILITATION LE EVALUATION    History    Diagnosis:   Encounter Diagnoses   Name Primary?   . Pain in joint, lower leg Yes   . Stress fracture of the metatarsals    . Closed fracture of unspecified part of radius with ulna        Referring MD: Flemister, Ardeen Garland, MD  [address]i [place]  [place], [place] [address]  Onset date:  06/29/11    Subjective    [name] is a 55 y.o. female who is present today for right knee care.   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 Knee Injury   . Ankle Injury   . Elbow Injury     Symptom location: Anterior, right  Relevant symptoms:  Pain , Decreased ROM, Decreased strength  Symptom frequency: Persistent  Symptom intensity:  (0 - 10 scale): Now 0 Best 0 Worst 3   Night Pain: no   Restful sleep:   yes  Morning Pain/Stiffness: Increased   Symptoms worsen with: WB, Walking, Transfers  Symptoms improve with: Rest  Assistive device:  walker  Patient's goals for therapy: Reduce pain, Increase ROM, Increase strength     Objective    Observation: Effusion  Gait:  Antalgic    Lumbar Screen:  NA  Neurologic:  NA    Palpation:  tenderness @ localized at medial joint line  Incision:  Benign    ROM/Strength         LEFT RIGHT Strength     Hip PROM AROM PROM AROM Left Right   Flexion         Extension         Abduction         Internal Rotation         External Rotation         Adduction                     LEFT RIGHT Strength   Knee PROM AROM PROM AROM Left Right   Flexion  135  125  4/5   Extension  5 hyper  5 hyper  3+/5                        LEFT RIGHT   Strength    PROM AROM PROM AROM Right Left   Ankle         DF (knee ext)         DF (knee flex)         Plantarflexion         Inversion          Eversion         Other             Special Tests:   Hip:  N/A     Knee:  Valgus,   negative, Varus,  negative, Lachman,  negative     Ankle:  N/A      Functional:  NA     Assessment:    Findings consistent with 55 y.o., female with   Encounter Diagnoses   Name Primary?   Marland Kitchen  Pain in joint, lower leg Yes   . Stress fracture of the metatarsals    . Closed fracture of unspecified part of radius with ulna      with pain, swelling, ROM limitations, strength limitations, functional limitations    Prognosis:  Fair   Contraindications/Precautions/Limitation:  Per diagnosis  Short Term Goals:  Minimal assistance with HEP/ education concepts  Long Term Goals:  Pain/Sx 0 - minimal, ROM/ flexibility WNL , Restoration of functional strength, Independent with HEP/education , Functional return to ADLs / activites without limitation        Treatment Plan:      Options / plan reviewed with patient:  yes  Freq 1 times per week for 2 month    Treatment plan inclusive of:   Exercise: AROM, Stretching, Progressive Resistive   Manual Techniques:  N/A   Modalities:  Cryotherapy   Functional: Functional rehab    Thank you for referring this patient to [facility] Sp[facility].    Bryker Fletchall    HEP 2x/day  QS 10x - 30x 5sec hold   SLR 10x - 30x   Standing calf stretch 30 sec 2x

## 2011-07-25 ENCOUNTER — Ambulatory Visit: Payer: Self-pay | Admitting: Rehabilitative and Restorative Service Providers"

## 2011-07-25 DIAGNOSIS — S5290XA Unspecified fracture of unspecified forearm, initial encounter for closed fracture: Secondary | ICD-10-CM

## 2011-07-25 DIAGNOSIS — M25569 Pain in unspecified knee: Secondary | ICD-10-CM

## 2011-07-25 DIAGNOSIS — M84376A Stress fracture, unspecified foot, initial encounter for fracture: Secondary | ICD-10-CM

## 2011-07-25 NOTE — Progress Notes (Signed)
Sports PT Daily Note    [name]   [id number]   Encounter Diagnoses   Name Primary?   . Pain in joint, lower leg Yes   . Stress fracture of the metatarsals    . Closed fracture of unspecified part of radius with ulna          Subjective:  Pain Score:  2  Pain: Improved, decreased complaints of R knee pain     Objective:  ROM - Improved, Right, Knee  Strength - Improved,  Per function, Per patient report  Function: - Improved, able to amb independently without LOB in clinic without rolling walker,  Will DC rolling walker at home for short distances and increase as tol  Education:  Verbal cues for ther ex    Objective       Manual stretching wrist flex, ext, sup, pronation  5 min        Gait training     Agility ladder side steps     Agility ladder     Hurdle step overs     Marching         Rockerboard lateral weight shifts     Leg Press         Rice bucket finger flex/ext  30x    Rice bucket wrist sup/pro  30x    Flexbar wrist flex/ext  30x    Flexbar wrist pro/sup  30x    Dumbbell wrist pro/sup  30x 5lbs            QS  20x   SLR 20x   Standing calf stretch 30 sec 2x                 Treatment Modalities:  NA    Assessment:  Patient responding favorably    Plan of Care:  Continue per Plan of care -  As written    Thank you for referring this patient to [facility] Spine [facility]    [employer]

## 2011-08-01 ENCOUNTER — Ambulatory Visit: Payer: Self-pay | Admitting: Rehabilitative and Restorative Service Providers"

## 2011-08-01 DIAGNOSIS — S5290XA Unspecified fracture of unspecified forearm, initial encounter for closed fracture: Secondary | ICD-10-CM

## 2011-08-01 DIAGNOSIS — M84376A Stress fracture, unspecified foot, initial encounter for fracture: Secondary | ICD-10-CM

## 2011-08-01 DIAGNOSIS — M25569 Pain in unspecified knee: Secondary | ICD-10-CM

## 2011-08-01 NOTE — Progress Notes (Signed)
Sports PT Daily Note    [name]   [id number]   Encounter Diagnoses   Name Primary?   . Pain in joint, lower leg Yes   . Stress fracture of the metatarsals    . Closed fracture of unspecified part of radius with ulna          Subjective:  Pain Score:  2/10 R knee pain, intermittent  Pain: Improved    Objective:  ROM - Improved, Right, Knee, full ext without complaints of pain   Strength - Ther Ex per flowsheet  Function: - Improved, able to amb with hinge brace and walker for 3 days without instability.  Education:  Verbal cues for ther ex    Objective       Manual stretching wrist flex, ext, sup, pronation  5 min        Gait training  5 min   Agility ladder side steps     Agility ladder  10 lengths    Hurdle step overs     Marching         Rockerboard lateral weight shifts     Leg Press  2p 3x10 R / 3p 3x10 L        Rice bucket finger flex/ext  30x    Rice bucket wrist sup/pro  30x    Flexbar wrist flex/ext  30x    Flexbar wrist pro/sup  30x    Dumbbell wrist pro/sup  30x 5lbs            QS     SLR     Standing calf stretch with wedge 30 sec 2x B             Treatment Modalities:  NA    Assessment:  Patient responding favorably    Plan of Care:  Continue per Plan of care -  As written    Thank you for referring this patient to [facility] Spine Rehabilitation    [employer]

## 2011-08-08 ENCOUNTER — Ambulatory Visit: Payer: Self-pay | Admitting: Rehabilitative and Restorative Service Providers"

## 2011-08-08 DIAGNOSIS — S5290XA Unspecified fracture of unspecified forearm, initial encounter for closed fracture: Secondary | ICD-10-CM

## 2011-08-08 DIAGNOSIS — M84376A Stress fracture, unspecified foot, initial encounter for fracture: Secondary | ICD-10-CM

## 2011-08-08 DIAGNOSIS — M25569 Pain in unspecified knee: Secondary | ICD-10-CM

## 2011-08-08 NOTE — Progress Notes (Signed)
Sports PT Daily Note    [name]   [id number]   Encounter Diagnoses   Name Primary?   . Pain in joint, lower leg Yes   . Stress fracture of the metatarsals    . Closed fracture of unspecified part of radius with ulna          Subjective:  Pain Score:  0  Pain: Improved in R knee, complains of L LE radic pain today    Objective:  ROM - Unchanged  Strength - Unchanged  Function: - Unchanged  Education:  Verbal cues for ther ex    Objective       Manual stretching wrist flex, ext, sup, pronation  5 min        Gait training  5 min    Agility ladder side steps  4 lengths   Agility ladder  10 lengths    Hurdle step overs     Marching  20x       Rockerboard lateral weight shifts     Leg Press  2.5p 3x10 R / 3.5p 3x10 L        Rice bucket finger flex/ext  30x    Rice bucket wrist sup/pro  30x    Flexbar wrist flex/ext  30x    Flexbar wrist pro/sup  30x    Dumbbell wrist pro/sup  30x 5lbs            QS     SLR     Standing calf stretch with wedge  30 sec 2x B                Treatment Modalities:  NA    Assessment:  Patient responding favorably    Plan of Care:  Continue per Plan of care -  As written    Thank you for referring this patient to [facility] Sports and Spine Rehabilitation    [employer]

## 2011-08-14 ENCOUNTER — Telehealth: Payer: Self-pay | Admitting: Primary Care

## 2011-08-14 DIAGNOSIS — E785 Hyperlipidemia, unspecified: Secondary | ICD-10-CM

## 2011-08-14 DIAGNOSIS — M810 Age-related osteoporosis without current pathological fracture: Secondary | ICD-10-CM

## 2011-08-14 DIAGNOSIS — E559 Vitamin D deficiency, unspecified: Secondary | ICD-10-CM

## 2011-08-14 NOTE — Telephone Encounter (Signed)
Pt is coming in for an appt with you on 10/16. She was wondering, since it's been a while, if you would like her to have any labs done prior? Let me know! Thank you!

## 2011-08-15 NOTE — Telephone Encounter (Signed)
Pt notified via VM. Thanks!

## 2011-08-15 NOTE — Telephone Encounter (Signed)
pls let her know I sent fasting lab req to the lab

## 2011-08-17 ENCOUNTER — Ambulatory Visit: Payer: Self-pay | Admitting: Rehabilitative and Restorative Service Providers"

## 2011-08-17 DIAGNOSIS — M25569 Pain in unspecified knee: Secondary | ICD-10-CM

## 2011-08-17 DIAGNOSIS — S5290XA Unspecified fracture of unspecified forearm, initial encounter for closed fracture: Secondary | ICD-10-CM

## 2011-08-17 DIAGNOSIS — M84376A Stress fracture, unspecified foot, initial encounter for fracture: Secondary | ICD-10-CM

## 2011-08-17 LAB — HM DEXA SCAN

## 2011-08-17 NOTE — Progress Notes (Signed)
Sports PT Daily Note    DRESDEN BOKHARI   [id number]   Encounter Diagnoses   Name Primary?   . Pain in joint, lower leg Yes   . Stress fracture of the metatarsals    . Closed fracture of unspecified part of radius with ulna          Subjective:  Pain Score:  0  Pain: Improved in R knee, complains of L posterior foot pain with decrease L buttock pain.  Mild reports of R knee stiffness    Objective:  ROM - Unchanged  Strength - Unchanged  Function: - Unchanged  Education:  Verbal cues for ther ex    Objective       Manual stretching wrist flex, ext, sup, pronation  5 min        Gait training  5 min    Agility ladder side steps  4 lengths   Agility ladder  10 lengths    Hurdle step overs     Marching  20x       Rockerboard lateral weight shifts     Leg Press  2.5p 3x10 R / 3.5p 3x10 L        Rice bucket finger flex/ext  30x    Rice bucket wrist sup/pro  30x    Flexbar wrist flex/ext  30x    Flexbar wrist pro/sup  30x    Dumbbell wrist pro/sup  30x 5lbs            QS     SLR     Standing calf stretch with wedge  30 sec 2x B                Treatment Modalities:  NA    Assessment:  Patient responding favorably    Plan of Care:  Continue per Plan of care -  As written    Thank you for referring this patient to [facility] and Spine Rehabilitation    [employer]

## 2011-08-18 ENCOUNTER — Ambulatory Visit: Payer: Self-pay

## 2011-08-18 ENCOUNTER — Encounter: Payer: Self-pay | Admitting: Gastroenterology

## 2011-08-18 DIAGNOSIS — M549 Dorsalgia, unspecified: Secondary | ICD-10-CM

## 2011-08-18 NOTE — Progress Notes (Signed)
SPINE REHABILITATION RE-EVALUATION    History  Diagnosis:   Encounter Diagnosis   Name Primary?   . Back pain Yes       Referring MD: Dr. Theodis Shove    Subjective    [name] is a 55 y.o. female who is receiving treatment for low back pain and thoracic pain Mechanism of injury/history of symptoms: No specific cause, Repetitive strain and has been absent from PT for some time for back treatment secondary to injuring her right knee. Her knee has improved to the point where she is no longer using a walkerand she feels comfortable returning for physical therapy for her spinal issues.    Pt has received care for 3 visits to date.  Attendance:  recently absent from therapy for about 1 month due to knee inury   Compliance:  unable to perform back exercises recently secondary to knee injury  Symptom location: central low and mid back, sometimes increased on right compared with left,   Relevant symptoms: Pain   Symptom frequency: Intermittent  Symptom intensity (0 - 10 scale): Now 1 Best 0 Worst 5  Patient's goals for therapy:  Increase strength, Independent with home program     Objective:    Palpation:  N/T  ROM Loss    Flexion: minor loss  Extension:  moderate loss  Left Sidebend: minor loss  Right Sidebend:  minor loss  Left Rotation:  minor loss  Right Rotation:  minor loss  Repeated Movement Testing:  N/A   Neuromuscular Assessment: Not Tested                      Strength: Patient Performing Level one spine exercise, generally deconditioned    McGill Stage: Core and whole body stability    Comments:  generally deconditioned with multiple complicating injuries  SPINE ASSESSMENT    Assessment:   Findings consistent with 55 y.o. female with   Encounter Diagnosis   Name Primary?   . Back pain Yes    with pain, ROM limitations, strength limitations, functional limitations    Prognosis:  Good      Contraindications/Precautions/Limitation:  Per diagnosis  Short Term Goals: Decrease c/o pain by 50% and Minimal  assistance with HEP/ education concepts   Long Term Goals: Walk community distances without increased symptoms, Stand as long as needed when completing ADL's without increased symptoms, Sit as long as needed with ADL's without increased symptoms, Drive automobile without increased symptoms, Transfer from sit to stand and perform bed mobility without increased symptoms      Treatment Plan:     Patient/family involved in developing goals and treatment plan: yes  Freq 1 times per week(s) for 6-8 week(s)    Treatment plan inclusive of:   Exercise: Stretching, Progressive Resistive, Aerobic exercise   Manual Techniques:  N/A   Modalities:  Cryotherapy, Moist heat, TENS, Ther Exercise per flowsheet, as indicated   Functional: Proprioception/Dynamic stability, Functional rehab    Patient was educated on how the principles of the Active Spine Program will benefit their condition.  Patient was educated on the benefits of maintaining an active lifestyle. The patient was instructed on core strengthening exercises for spine support and stability; flexibility exercise for the upper and lower extremities to reduce movement stress to the spine; and aerobic exercise for weight management and endurance.  The patient was also educated on proper posture and body mechanics to avoid painful movements and to work on any directional preference positions.  Modalities for pain control, and traction will be used as necessary to benefit the patients symptoms.        Patient was instructed how to gradually progress their exercise routine within their range of tolerable symptoms.  Patient was advised that maintenance and consistency with the exercise routine at home is key to success.  And that it is critical that they notify their therapist of any prescribed activity that increases their symptoms so that the routine may be modified and progressed.      Thank you for the referral of this patient to the [facility] Active Spine  Program.    Sincerely,  Baxter Kail    Exercise Repetitions 08/18/11   Ab/glute brace x30 each x15   Seated marches x30 x10   Standing marches x30 x10   Table plank Unable secondary to ankle issues -   Curl-up x15 x10   Bridge 5"x15 x8   Recumbent bike 3' 2'   Clamshell x10 x10 right, x15 left   Scap squeezes  x15   Seated upper trapezius stretch  3x15"

## 2011-08-22 ENCOUNTER — Ambulatory Visit: Payer: Self-pay | Admitting: Rehabilitative and Restorative Service Providers"

## 2011-08-22 ENCOUNTER — Ambulatory Visit: Payer: Self-pay

## 2011-08-22 ENCOUNTER — Ambulatory Visit
Admit: 2011-08-22 | Discharge: 2011-08-22 | Disposition: A | Discharge status: Home / Self Care | Payer: Self-pay | Source: Ambulatory Visit | Attending: Primary Care | Admitting: Primary Care

## 2011-08-22 DIAGNOSIS — R569 Unspecified convulsions: Secondary | ICD-10-CM

## 2011-08-22 DIAGNOSIS — M810 Age-related osteoporosis without current pathological fracture: Secondary | ICD-10-CM

## 2011-08-22 DIAGNOSIS — E559 Vitamin D deficiency, unspecified: Secondary | ICD-10-CM

## 2011-08-22 DIAGNOSIS — E785 Hyperlipidemia, unspecified: Secondary | ICD-10-CM

## 2011-08-22 LAB — LIPID PANEL
Chol/HDL Ratio: 2.6
Cholesterol: 282 mg/dL — AB
HDL: 107 mg/dL
LDL Calculated: 157 mg/dL
Non HDL Cholesterol: 175 mg/dL
Triglycerides: 89 mg/dL

## 2011-08-22 LAB — COMPREHENSIVE METABOLIC PANEL
ALT: 33 U/L (ref 0–35)
AST: 23 U/L (ref 0–35)
Albumin: 4.6 g/dL (ref 3.5–5.2)
Alk Phos: 99 U/L (ref 35–105)
Anion Gap: 9 (ref 7–16)
Bilirubin,Total: 0.3 mg/dL (ref 0.0–1.2)
CO2: 30 mmol/L — ABNORMAL HIGH (ref 20–28)
Calcium: 9.6 mg/dL (ref 8.6–10.2)
Chloride: 99 mmol/L (ref 96–108)
Creatinine: 0.68 mg/dL (ref 0.51–0.95)
GFR,Black: >59 *
GFR,Caucasian: >59 *
Glucose: 81 mg/dL (ref 60–99)
Lab: 18 mg/dL (ref 6–20)
Potassium: 4.4 mmol/L (ref 3.3–5.1)
Sodium: 138 mmol/L (ref 133–145)
Total Protein: 7.2 g/dL (ref 6.3–7.7)

## 2011-08-22 LAB — HIGH SENSITIVITY CRP: CRP,High Sensitivity: 4.3 mg/L — AB

## 2011-08-22 LAB — TSH: TSH: 3.06 u[IU]/mL (ref 0.27–4.20)

## 2011-08-22 LAB — MULTIPLE ORDERING DOCS

## 2011-08-24 LAB — CARBAMAZEPINE, FREE & TOTAL
Carbamazepine, Free: 22.6 % (ref 8.0–35.0)
Carbamazepine,Free: 3 ug/mL (ref 1.0–3.0)
Carbamazepine,Total: 13.3 ug/mL — ABNORMAL HIGH (ref 4.0–12.0)

## 2011-08-24 LAB — VITAMIN D
25-OH VIT D2: <4 ng/mL
25-OH VIT D3: 50 ng/mL
25-OH Vit Total: 50 ng/mL (ref 30–80)

## 2011-08-25 ENCOUNTER — Ambulatory Visit: Payer: Self-pay

## 2011-08-29 ENCOUNTER — Ambulatory Visit: Payer: Self-pay | Admitting: Rehabilitative and Restorative Service Providers"

## 2011-08-29 DIAGNOSIS — S5290XA Unspecified fracture of unspecified forearm, initial encounter for closed fracture: Secondary | ICD-10-CM

## 2011-08-29 DIAGNOSIS — M25569 Pain in unspecified knee: Secondary | ICD-10-CM

## 2011-08-29 DIAGNOSIS — M84376A Stress fracture, unspecified foot, initial encounter for fracture: Secondary | ICD-10-CM

## 2011-08-29 NOTE — Progress Notes (Signed)
Sports PT Daily Note    MIRASOL BUTERA   [id number]   Encounter Diagnoses   Name Primary?   . Pain in joint, lower leg Yes   . Stress fracture of the metatarsals    . Closed fracture of unspecified part of radius with ulna          Subjective:  Pain Score:  0  Pain: Improved R knee pain.  Complains of medial shin pain today for NAR.    Objective:  ROM - Unchanged  Strength - Unchanged  Function: - Unchanged  Education:  Verbal cues for ther ex    Objective       Manual stretching wrist flex, ext, sup, pronation  5 min        Gait training  5 min    Agility ladder side steps  4 lengths   Agility ladder  10 lengths    Hurdle step overs     Marching  20x       Rockerboard lateral weight shifts     Leg Press  2.5p 3x10 R / 3.5p 3x10 L        Rice bucket finger flex/ext  30x    Rice bucket wrist sup/pro  30x    Flexbar wrist flex/ext  30x    Flexbar wrist pro/sup  30x    Dumbbell wrist pro/sup  30x 5lbs            QS     SLR     Standing calf stretch with wedge  30 sec 2x B                Treatment Modalities:  NA    Assessment:  Patient responding favorably    Plan of Care:  Continue per Plan of care -  As written    Thank you for referring this patient to [facility] Sports and Spine Rehabilitation    [employer]

## 2011-09-01 ENCOUNTER — Ambulatory Visit: Payer: Self-pay

## 2011-09-05 ENCOUNTER — Encounter: Payer: Self-pay | Admitting: Primary Care

## 2011-09-05 ENCOUNTER — Ambulatory Visit: Payer: Self-pay | Admitting: Rehabilitative and Restorative Service Providers"

## 2011-09-05 ENCOUNTER — Ambulatory Visit: Payer: Self-pay | Admitting: Primary Care

## 2011-09-05 VITALS — BP 110/80 | HR 72 | Ht 64.5 in | Wt 120.8 lb

## 2011-09-05 DIAGNOSIS — Z23 Encounter for immunization: Secondary | ICD-10-CM

## 2011-09-05 DIAGNOSIS — S5290XA Unspecified fracture of unspecified forearm, initial encounter for closed fracture: Secondary | ICD-10-CM

## 2011-09-05 DIAGNOSIS — M25569 Pain in unspecified knee: Secondary | ICD-10-CM

## 2011-09-05 DIAGNOSIS — M84376A Stress fracture, unspecified foot, initial encounter for fracture: Secondary | ICD-10-CM

## 2011-09-05 MED ORDER — NYSTOP 100000 UNIT/GM EX POWD *I*
Freq: Two times a day (BID) | CUTANEOUS | Status: DC | PRN
Start: 2011-09-05 — End: 2013-02-14

## 2011-09-05 NOTE — Patient Instructions (Signed)
Trial of Prolia  Avoid the reglan and instead try changing the timing of the aciphex to prior to lunch

## 2011-09-05 NOTE — Progress Notes (Signed)
Sports PT Daily Note    CAETLIN BELGER   [id number]   Encounter Diagnoses   Name Primary?   . Pain in joint, lower leg Yes   . Stress fracture of the metatarsals    . Closed fracture of unspecified part of radius with ulna          Subjective:  Pain Score:  0  Pain: Improved R knee pain.  Decreased complaints of medial shin pain today secondary to changing to old orthotics    Objective:  ROM - Unchanged  Strength - Unchanged  Function: - Unchanged  Education:  Verbal cues for ther ex    Objective       Manual stretching wrist flex, ext, sup, pronation  5 min        Gait training  5 min    Agility ladder side steps  4 lengths   Agility ladder  10 lengths    Hurdle step overs     Marching         Rockerboard lateral weight shifts     Leg Press  2.5p 3x10 R / 3.5p 3x10 L        Rice bucket finger flex/ext  30x    Rice bucket wrist sup/pro  30x    Flexbar wrist flex/ext  30x    Flexbar wrist pro/sup  30x    Dumbbell wrist pro/sup  30x 5lbs            QS     SLR     Standing calf stretch with wedge     Calf raises 30x           Treatment Modalities:  NA    Assessment:  Patient responding favorably    Plan of Care:  Continue per Plan of care -  As written    Thank you for referring this patient to [facility] Sports and Spine Rehabilitation    [employer]

## 2011-09-05 NOTE — Progress Notes (Signed)
Reason For Visit:   Chief Complaint   Patient presents with   . Hypertension   . Gastrophageal Reflux       Subjective:      [name] is 55 y.o. year old female with significant  osteoporosis, GERD, Hypothyroidism and IBS with chronic constipation who presents today for follow up on symptom control and evaluate sore on buttock.    GERD: Patient still experiencing significant GERD symptoms, progressively worse during the day. Currently taking once daily Aciphex with breakfast and has not increased the dose due to osteoporosis.  She has severely limited her diet to try and avoid offending agents, does not eat chocolate or consume caffeine or alcohol. Eats mostly bran, cheese, yogurt, oatmeal rasin cookies, turkey,and very little red meat.  Patient recently saw her Gastroenterologist who suggested trying Reglan as an adjuvant to help with symptoms.      HTN: BP 110/80 at visit.  Patient exercising with PT twice a week and working on an exercise program at home in which she is 'working up to [employer] again'.     Constipation: She is taking mirlax qhs and eating bran.     Osteoporosis: followed closely by the bone clinic. Patient was scheduled to begin Reclast but broke her wrist.  Therefore it was suggested at her recent visit to try starting twice yearly injections of Prolia.    Patient also reports a non-pruritic, non-bleeding, at times painful sore on her right buttock for about a month. She thinks it might be due to the woven chair she uses to sit in the shower to prevent falls while she washes.      Home Medications:     Prior to Admission medications    Medication Sig Start Date End Date Taking? Authorizing Provider   azelastine (ASTELIN) 137 MCG/SPRAY nasal spray USE 2 SPRAYS 4 TIMES DAILY AS DIRECTED 06/15/11   Virgel Manifold, MD   rabeprazole (ACIPHEX) 20 MG tablet Take 20 mg by mouth daily        [provider]   TEGRETOL XR 200 MG 12 hr tablet TAKE 2 TABLETS TWICE DAILY 05/22/11   Thurnell Garbe,  NP   levothyroxine (SYNTHROID, LEVOTHROID) 25 MCG tablet Take 1 tablet by mouth daily 05/03/11   Deirdre Priest, MD   NON-SYSTEM MEDICATION *A* PT:  Evaluate and treat  Dx: back pain 04/03/11   Deirdre Priest, MD   Cholecalciferol (VITAMIN D) 1000 UNITS capsule TAKE 1 CAPSULE 3 TIMES DAILY 03/15/10   Provider, Conversion   fluticasone-salmeterol (ADVAIR DISKUS) 250-50 MCG/DOSE diskus inhaler Inhale 1 puff by mouth twice a day 08/06/09   Provider, Conversion   darifenacin (ENABLEX) 15 MG 24 hr tablet TAKE 1 TABLET DAILY. 08/27/08   Provider, Conversion   NORITATE 1 % cream  01/15/08   Provider, Conversion   carBAMazepine (TEGRETOL XR) 100 MG 12 hr tablet TAKE 1 TABLET TWICE DAILY 01/14/08   Provider, Conversion   amLODIPine (NORVASC) 5 MG tablet TAKE 1 TABLET DAILY. 07/12/06   Provider, Conversion   loratadine (CLARITIN) 10 MG tablet TAKE 1 TABLET DAILY. 10/19/05   Provider, Conversion   Multiple Vitamin (MULTIVITAMIN) per tablet TAKE 1 TABLET DAILY. 10/19/05   Provider, Conversion   Calcium Carb-Cholecalciferol 600-200 MG-UNIT TABS TAKE  TABLET 3 TIMES DAILY 10/19/05   Provider, Conversion     Patient Active Problem List   Diagnoses Code   . Reported Trauma Neck 9990   . Allergic Rhinitis 477.9   . Irritable Bowel  Syndrome 564.1   . Hypertension 401.9   . Dysplastic Nevus 448.1   . Rosacea 695.3   . Osteoporosis 733.00   . Hypothyroidism 244.9   . Scoliosis 737.30   . Urge Incontinence Of Urine 788.31   . 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 Convulsive Disorder 780.39   . 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 Ringing In The Ears (Tinnitus) 388.30       Allergies:     Allergies   Allergen Reactions   . Bactrim    . Dilantin       Edema   . No Known Latex Allergy    . Penicillins       Hives   . Sulfa Drugs      Review of Systems:   Otherwise negative unless stated in Subjective  PHYSICAL EXAM:  Temp Readings from Last 3 Encounters:   01/25/10 36.8 C (98.24 F)    10/23/08 36.7 C (98  F)    06/08/08 37.7 C (99.8 F)      BP Readings from Last 3 Encounters:   09/05/11 110/80   07/14/11 122/72   06/30/11 146/76     Pulse Readings from Last 3 Encounters:   09/05/11 72   06/06/11 76   12/21/10 76         Filed Vitals:    09/05/11 0901   BP: 110/80   Pulse: 72   Height: 1.638 m (5' 4.5")   Weight: 54.795 kg (120 lb 12.8 oz)     Wt Readings from Last 3 Encounters:   09/05/11 54.795 kg (120 lb 12.8 oz)   07/14/11 54.432 kg (120 lb)   06/30/11 54.432 kg (120 lb)       General: Well developed female, NAD  HEENT: Anicteric, MMM, OP clear, no adenopathy  Lungs: CTAB no w/r/r  Heart: RRR no m/r/g  Abdomen: Soft, NT, ND, +BS  Buttock: b/l erythematous patch on adjoining butt cheeks with possible satellite lesions on the peripheries   Extremities: No edema, no rashes  Neurologic: Grossly intact.     ASSESSMENT and Plan:     55 y.o. year old female with significant  osteoporosis, GERD, Hypothyroidism and IBS with chronic constipation who presents today for follow up on symptom control and evaluate sore on buttock.    GERD  -instructed patient to take Aciphex 30 mins before meals.  Moved scheduled time to before lunch, since symptoms seem to worsen through the day.    -Will not increase Aciphex yet due to risk for worsening significant osteoporosis  -Also will not start patient on Reglan for its significant risk for neurological side effects: Tardive Dyskinesia and lowering seizure threshold as patient has significant seizure history and is currently on antiepileptics.     HTN:   -well controlled.  Continued to encourage exercise.    Osteoporosis  -Recently had repeat DEXA, results not available through e-record but patient told by bone clinic that there was not any worsening of osteoporosis.   -Based on patient's history and past trial of medicaitons for her significnat osteoporosis, encouraged patient to try Prolia injections.  -Continue on Calcium and Vit. D supplements     Fungal infection vs.  Trauma/chafing   -have instructed patient to limit moisture to area and prescribed nystatin powder BID PRN for possible fungal skin infection.    Flu vaccine administered today.     Katrinka Blazing, MD 09/05/2011 9:17 AM    Patient was seen  and examined along with Dr Luisa Hart,  I agree with the history, physical and assessment and plan as documented above.  Her medication list was reviewed and new medications were prescribed as above.  She is going to try changing the timing of the Aciphex to see if that might help with her symptoms we can avoid increasing the dose or initiating Reglan.  She is concerned about the initiation of the Reglan given that it can lower her seizure threshold and I certainly would not start unless we verified its safety with Dr. Soyla Dryer.  With regard to her hypertension, blood pressure is at goal under 140/90, continue current regimen.  With regard to her osteoporosis, I do not have much experience with Prolia.  However, in looking up the side effect profile, I think it is probably preferred over the other options that we have available.  She was asked to discuss this further with Dr. Hassell Halim.  With regard to the rash on her buttock, it does appear to be some chafing but I also believe that there is a fungal component d/t  Moisture in the area.  We are going to trial a nystatin powder to see if this might alleviate the symptoms and allow for more adequate healing.

## 2011-09-11 ENCOUNTER — Ambulatory Visit
Admit: 2011-09-11 | Discharge: 2011-09-11 | Disposition: A | Discharge status: Home / Self Care | Payer: Self-pay | Source: Ambulatory Visit | Attending: Primary Care | Admitting: Primary Care

## 2011-09-11 DIAGNOSIS — R569 Unspecified convulsions: Secondary | ICD-10-CM

## 2011-09-12 ENCOUNTER — Ambulatory Visit: Payer: Self-pay | Admitting: Rehabilitative and Restorative Service Providers"

## 2011-09-12 DIAGNOSIS — M25569 Pain in unspecified knee: Secondary | ICD-10-CM

## 2011-09-12 DIAGNOSIS — M84376A Stress fracture, unspecified foot, initial encounter for fracture: Secondary | ICD-10-CM

## 2011-09-12 DIAGNOSIS — S5290XA Unspecified fracture of unspecified forearm, initial encounter for closed fracture: Secondary | ICD-10-CM

## 2011-09-12 NOTE — Progress Notes (Signed)
Sports PT Daily Note    THEODOCIA DRINKARD   [id number]   Encounter Diagnoses   Name Primary?   . Pain in joint, lower leg Yes   . Stress fracture of the metatarsals    . Closed fracture of unspecified part of radius with ulna          Subjective:  Pain Score:  0  Pain: Improved, minimal to no complaints of knee pain on this date    Objective:  ROM - Unchanged  Strength - Unchanged  Function: - Unchanged  Education:  Verbal cues for ther ex    Objective       Manual stretching wrist flex, ext, sup, pronation  5 min        Gait training  5 min    Agility ladder side steps  4 lengths   Agility ladder  10 lengths    Hurdle step overs     Marching         Rockerboard lateral weight shifts with airex  30x   Leg Press  2.5p 3x10 R / 3.5p 3x10 L        Rice bucket finger flex/ext  30x    Rice bucket wrist sup/pro  30x    Flexbar wrist flex/ext  30x    Flexbar wrist pro/sup  30x    Dumbbell wrist pro/sup  30x 5lbs            QS     SLR     Standing calf stretch with wedge     Calf raises 30x           Treatment Modalities:  NA    Assessment:  Patient responding favorably    Plan of Care:  Continue per Plan of care -  As written    Thank you for referring this patient to [facility] Sports and Spine Rehabilitation    [employer]

## 2011-09-13 LAB — CARBAMAZEPINE, FREE & TOTAL
Carbamazepine, Free: 28.6 % (ref 8.0–35.0)
Carbamazepine,Free: 2.6 ug/mL (ref 1.0–3.0)
Carbamazepine,Total: 9.1 ug/mL (ref 4.0–12.0)

## 2011-09-19 ENCOUNTER — Ambulatory Visit: Payer: Self-pay | Admitting: Rehabilitative and Restorative Service Providers"

## 2011-09-22 ENCOUNTER — Ambulatory Visit: Payer: Self-pay

## 2011-09-26 ENCOUNTER — Ambulatory Visit: Payer: Self-pay | Admitting: Rehabilitative and Restorative Service Providers"

## 2011-09-26 DIAGNOSIS — S5290XA Unspecified fracture of unspecified forearm, initial encounter for closed fracture: Secondary | ICD-10-CM

## 2011-09-26 DIAGNOSIS — M25569 Pain in unspecified knee: Secondary | ICD-10-CM

## 2011-09-26 NOTE — Progress Notes (Signed)
Sports PT Daily Note    [name]   [id number]   Encounter Diagnoses   Name Primary?   . Pain in joint, lower leg Yes   . Closed fracture of unspecified part of radius with ulna          Subjective:  Pain Score:  0  Pain: Unchanged    Objective:  ROM - Unchanged  Strength - Unchanged  Function: - Unchanged  Education:  Verbal cues for ther ex    Objective       Manual stretching wrist flex, ext, sup, pronation  5 min        Gait training  5 min    Agility ladder side steps  4 lengths   Agility ladder  10 lengths    Hurdle step overs     Marching         Rockerboard lateral weight shifts with airex  30x   Leg Press  2.5p 3x10 R / 3.5p 3x10 L        Rice bucket finger flex/ext  30x    Rice bucket wrist sup/pro  30x    Flexbar wrist flex/ext  30x    Flexbar wrist pro/sup  30x    Dumbbell wrist pro/sup  30x 5lbs            QS     SLR     Standing calf stretch with wedge     Calf raises 30x           Treatment Modalities:  NA    Assessment:  Patient responding favorably    Plan of Care:  Continue per Plan of care -  As written    Thank you for referring this patient to [facility] Sports and Spine Rehabilitation    [employer]

## 2011-09-29 ENCOUNTER — Ambulatory Visit: Payer: Self-pay

## 2011-09-29 NOTE — Progress Notes (Signed)
SPINE REHABILITATION RE-EVALUATION    History  Diagnosis:   Encounter Diagnosis   Name Primary?   . Back pain Yes       Referring MD: Dr. Theodis Shove    Subjective    NKAUJ Nelson is a 55 y.o. female who is receiving treatment for low back pain Mechanism of injury/history of symptoms: Repetitive strain    Pt has received care for 4 visits to date.  Attendance:  Fair    Compliance:  Fair   Symptom location: upper and lower back, central  Relevant symptoms: Aching, Pain , Numbness in right leg intermittently when she lays on her back.  Symptom frequency: Intermittent  Symptom intensity (0 - 10 scale): Now 1 Best 0 Worst 5  Patient's goals for therapy:  Increase strength, Independent with home program     Objective:    Palpation:  N/T  ROM Loss    Flexion: minor loss  Extension:  minor loss  Left Sidebend: minor loss  Right Sidebend:  minor loss  Left Rotation:  minor loss  Right Rotation:  minor loss  Repeated Movement Testing:  N/A   Neuromuscular Assessment: Not Tested, no complaints of neruological symptoms       Strength: Patient Performing Level one spine exercise, remedial strengthening exercises, to tolerance    [employer] Stage: Corrective exercise, Core and whole body stability    Comments:  NA    SPINE ASSESSMENT    Assessment:   Findings consistent with 55 y.o. female with   Encounter Diagnosis   Name Primary?   . Back pain Yes    with pain, ROM limitations, strength limitations, functional limitations    Prognosis:  Fair      Contraindications/Precautions/Limitation:  Per diagnosis  Short Term Goals: Decrease c/o pain by 50% and Minimal assistance with HEP/ education concepts   Long Term Goals: Walk community distances without increased symptoms, Stand as long as needed when completing ADL's without increased symptoms, Sit as long as needed with ADL's without increased symptoms, Drive automobile without increased symptoms, Transfer from sit to stand and perform bed mobility without increased symptoms        Treatment Plan:     Patient/family involved in developing goals and treatment plan: yes  Freq 1 times per week(s) for 6 week(s)    Treatment plan inclusive of:   Exercise: Stretching, Progressive Resistive, Aerobic exercise   Manual Techniques:  N/A   Modalities:  Moist heat, Ther Exercise per flowsheet   Functional: Proprioception/Dynamic stability, Functional rehab    Patient was educated on how the principles of the Active Spine Program will benefit their condition.  Patient was educated on the benefits of maintaining an active lifestyle. The patient was instructed on core strengthening exercises for spine support and stability; flexibility exercise for the upper and lower extremities to reduce movement stress to the spine; and aerobic exercise for weight management and endurance.  The patient was also educated on proper posture and body mechanics to avoid painful movements and to work on any directional preference positions.  Modalities for pain control, and traction will be used as necessary to benefit the patients symptoms.        Patient was instructed how to gradually progress their exercise routine within their range of tolerable symptoms.  Patient was advised that maintenance and consistency with the exercise routine at home is key to success.  And that it is critical that they notify their therapist of any prescribed activity that increases their  symptoms so that the routine may be modified and progressed.      Thank you for the referral of this patient to the [facility] Active Spine Program.    Sincerely,  [name]      Exercise Repetitions 08/18/11 09/29/11   Ab/glute brace x30 each x15 x30   Seated marches x30 x10 x30   Standing marches x30 x10 x15   Table plank Unable secondary to ankle issues -    Curl-up x15 x10 x15   Bridge 5"x15 x8 x15   Recumbent bike 3' 2' 2.5'   Clamshell x10 x10 right, x15 left x20   Scap squeezes  x15 x30   Seated upper trapezius stretch  3x15" 3x15"

## 2011-10-01 ENCOUNTER — Other Ambulatory Visit: Payer: Self-pay | Admitting: Neurology

## 2011-10-06 ENCOUNTER — Ambulatory Visit: Payer: Self-pay

## 2011-10-06 DIAGNOSIS — M549 Dorsalgia, unspecified: Secondary | ICD-10-CM

## 2011-10-06 NOTE — Progress Notes (Signed)
Sports PT Daily Note    [name]   [id number]   Encounter Diagnosis   Name Primary?   . Back pain Yes         Subjective:  Pain Score:  3  Pain: Unchanged    Objective:  ROM - NT this visit  Strength - Improved,  Per Ther Ex flowsheet  Function: - Unchanged. States that she has been trying to walk on her treadmill at home and has gotten up to 2 minutes  Education:  Updated HEP, Verbal cues for ther ex    Objective         Treatment Modalities:  NA    Assessment:  Patient responding favorably, Continue present therapy treatment    Plan of Care:  Continue per Plan of care -  As written    Thank you for referring this patient to [facility] Sports and Spine Rehabilitation    [facility]    Exercise Repetitions 08/18/11 09/29/11 10/06/11   Ab/glute brace x30 each x15 x30 Seated x30   Seated marches x30 x10 x30 x30   Standing marches x30 x10 x15 x30   Table plank Unable secondary to ankle issues -     Curl-up x15 x10 x15 2x15   Bridge 5"x15 x8 x15 x30   Recumbent bike 3' 2' 2.5' 3'   Clamshell x10 x10 right, x15 left x20 x30   Scap squeezes  x15 x30 Orange x20   Seated upper trapezius stretch  3x15" 3x15" 3x15"

## 2011-10-10 ENCOUNTER — Ambulatory Visit: Payer: Self-pay | Admitting: Rehabilitative and Restorative Service Providers"

## 2011-10-10 DIAGNOSIS — S5290XA Unspecified fracture of unspecified forearm, initial encounter for closed fracture: Secondary | ICD-10-CM

## 2011-10-10 DIAGNOSIS — M84376A Stress fracture, unspecified foot, initial encounter for fracture: Secondary | ICD-10-CM

## 2011-10-10 NOTE — Progress Notes (Signed)
Sports PT Daily Note    [name]   [id number]   Encounter Diagnoses   Name Primary?   . Stress fracture of the metatarsals Yes   . Closed fracture of unspecified part of radius with ulna          Subjective:  Pain Score:  0  Pain: Unchanged, report increase B wrist soreness NAR    Objective:  ROM - Unchanged  Strength - Unchanged  Function: - Unchanged  Education:  Verbal cues for ther ex    Objective       Manual stretching wrist flex, ext, sup, pronation  5 min        Gait training  5 min    Agility ladder side steps  4 lengths   Agility ladder  10 lengths    Hurdle step overs     Marching         Rockerboard lateral weight shifts with airex  30x   Leg Press  2.5p 3x10 R / 3.5p 3x10 L        Rice bucket finger flex/ext  30x    Rice bucket wrist sup/pro  30x    Flexbar wrist flex/ext  30x    Flexbar wrist pro/sup  30x    Dumbbell wrist pro/sup  30x 5lbs            QS     SLR     Standing calf stretch with wedge     Calf raises 30x           Treatment Modalities:  NA    Assessment:  Patient responding favorably    Plan of Care:  Continue per Plan of care -  As written, eval treadmill walk next visit    Thank you for referring this patient to [facility]    [employer]

## 2011-10-17 ENCOUNTER — Ambulatory Visit: Payer: Self-pay | Admitting: Rehabilitative and Restorative Service Providers"

## 2011-10-17 DIAGNOSIS — S5290XA Unspecified fracture of unspecified forearm, initial encounter for closed fracture: Secondary | ICD-10-CM

## 2011-10-17 DIAGNOSIS — M84376A Stress fracture, unspecified foot, initial encounter for fracture: Secondary | ICD-10-CM

## 2011-10-17 NOTE — Progress Notes (Signed)
SPORTS REHABILITATION PT LE STATUS    History  Diagnosis:   Encounter Diagnoses   Name Primary?   . Stress fracture of the metatarsals Yes   . Closed fracture of unspecified part of radius with ulna        Subjective    [name] is a 55 y.o. female who is present today for treatment of her B UE/LE weakness and balance deficits.  Mechanism of injury/history of symptoms: No specific cause    Attendance:  Good    Compliance:  Good   Relevant symptoms: Pain , Decreased ROM, Decreased strength, Instability, balance deficits    Symptom frequency: Persistent  Symptom intensity (0 - 10 scale): Now 0 Best 0 Worst 3    Patient's goals for therapy: Reduce pain, Increase ROM, Increase strength, Independent with home program, improve balance     Objective:    Observation: WNL   Palpation:  WNL     ROM/Strength:    bilateral knee, bilateral ankle          LE LEFT   RIGHT   Strength    PROM AROM PROM AROM Left Right   Knee ext     4/5 4/5   Knee flex     4/5 4/5   Ankle DF     4/5 4/5   Ankle PF     3+/5 3+/5            Wrist flex     4/5 4/5   Wrist ext     4/5 4/5   Wrist sup/pro     4/5 4/5       Special Tests:   Hip: N/A     Knee: N/A     Ankle: N/A     Functional:  Perform self-care activities/basic ADLs - able to perform.  Walk more than 10 minutes - unable to perform.  Ascend stairs with reciprocal gait - unable to perform.  Descend stairs with reciprocal gait - unable to perform.    Assessment  Findings consistent with 55 y.o., female with   Encounter Diagnoses   Name Primary?   . Stress fracture of the metatarsals Yes   . Closed fracture of unspecified part of radius with ulna      with ROM limitations, strength limitations, functional limitations, balance deficits    Prognosis:  Fair    Contraindications/Precautions/Limitation:  Per diagnosis  Long Term Goals:  Pain/Sx 0 - minimal, ROM/ flexibility WNL , Restoration of functional strength, Functional return to ADLs / activites without limitation        Treatment  Plan:   Patient/family involved in developing goals and treatment plan: yes  Freq 1 times per week(s) for 3 month(s)     Exercise: AROM, AAROM, PROM, Progressive Resistive   Manual Techniques:  N/A   Modalities:  NA    Functional: Proprioception/Dynamic stability, Functional rehab       Thank you for referring this patient to [facility].    Majestic Brister    Manual stretching wrist flex, ext, sup, pronation  5 min        Gait training  5 min    Agility ladder side steps  4 lengths    Agility ladder  10 lengths    Hurdle step overs     Marching         Rockerboard lateral weight shifts with airex  30x    Leg Press  2.5p 3x10 R /  3.5p 3x10 L        Rice bucket finger flex/ext  30x    Rice bucket wrist sup/pro  30x    Flexbar wrist flex/ext  30x    Flexbar wrist pro/sup  30x    Dumbbell wrist pro/sup  30x 5lbs            QS     SLR     Standing calf stretch with wedge     Calf raises  30x

## 2011-10-19 ENCOUNTER — Ambulatory Visit
Admit: 2011-10-19 | Discharge: 2011-10-19 | Disposition: A | Discharge status: Home / Self Care | Payer: Self-pay | Source: Ambulatory Visit | Attending: Gynecology | Admitting: Gynecology

## 2011-10-19 DIAGNOSIS — R569 Unspecified convulsions: Secondary | ICD-10-CM

## 2011-10-19 LAB — BASIC METABOLIC PANEL
Anion Gap: 11 (ref 7–16)
CO2: 28 mmol/L (ref 20–28)
Calcium: 9.4 mg/dL (ref 8.6–10.2)
Chloride: 100 mmol/L (ref 96–108)
Creatinine: 0.7 mg/dL (ref 0.51–0.95)
GFR,Black: >59 *
GFR,Caucasian: >59 *
Glucose: 95 mg/dL (ref 60–99)
Lab: 22 mg/dL — ABNORMAL HIGH (ref 6–20)
Potassium: 4.6 mmol/L (ref 3.3–5.1)
Sodium: 139 mmol/L (ref 133–145)

## 2011-10-19 LAB — MULTIPLE ORDERING DOCS

## 2011-10-20 ENCOUNTER — Ambulatory Visit: Payer: Self-pay

## 2011-10-20 DIAGNOSIS — M549 Dorsalgia, unspecified: Secondary | ICD-10-CM

## 2011-10-20 LAB — CARBAMAZEPINE, FREE & TOTAL
Carbamazepine, Free: 24 % (ref 8.0–35.0)
Carbamazepine,Free: 2.5 ug/mL (ref 1.0–3.0)
Carbamazepine,Total: 10.4 ug/mL (ref 4.0–12.0)

## 2011-10-20 NOTE — Progress Notes (Signed)
Sports PT Daily Note    [name]   [id number]   Encounter Diagnosis   Name Primary?   . Back pain Yes         Subjective:  Pain Score:  3  Pain: reports back is stiff after flying    Objective:  ROM - NT this visit  Strength - Ther Ex per flowsheet  Function: - Unchanged  Education:  Updated HEP, Verbal cues for ther ex, Spine ed concepts    Objective         Treatment Modalities:  NA    Assessment:  Patient responding favorably, Continue present therapy treatment    Plan of Care:  Continue per Plan of care -  As written    Thank you for referring this patient to [facility]    [facility]    Exercise Repetitions 08/18/11 09/29/11 10/06/11 10/20/11   Ab/glute brace x30 each x15 x30 Seated x30 -   Seated marches x30 x10 x30 x30 x30   Seated knee extensions     X30, noted numbness down right leg with this   Seated sciatic nerve glide     x7   Standing marches x30 x10 x15 x30 x30   Table plank Unable secondary to ankle issues -      Curl-up x15 x10 x15 2x15 2x15   Bridge 5"x15 x8 x15 x30 L2 orange x10   Recumbent bike 3' 2' 2.5' 3' 3.5'   Clamshell x10 x10 right, x15 left x20 x30 Orange x15   Scap squeezes  x15 x30 Orange x20 Orange x30   Seated upper trapezius stretch  3x15" 3x15" 3x15" 3x15"

## 2011-10-24 ENCOUNTER — Ambulatory Visit: Payer: Self-pay | Admitting: Rehabilitative and Restorative Service Providers"

## 2011-10-27 ENCOUNTER — Ambulatory Visit: Payer: Self-pay

## 2011-10-31 ENCOUNTER — Ambulatory Visit: Payer: Self-pay | Admitting: Rehabilitative and Restorative Service Providers"

## 2011-10-31 DIAGNOSIS — M84376A Stress fracture, unspecified foot, initial encounter for fracture: Secondary | ICD-10-CM

## 2011-10-31 DIAGNOSIS — S52209B Unspecified fracture of shaft of unspecified ulna, initial encounter for open fracture type I or II: Secondary | ICD-10-CM

## 2011-11-03 ENCOUNTER — Ambulatory Visit: Payer: Self-pay

## 2011-11-07 ENCOUNTER — Ambulatory Visit: Payer: Self-pay | Admitting: Rehabilitative and Restorative Service Providers"

## 2011-11-07 DIAGNOSIS — S5290XA Unspecified fracture of unspecified forearm, initial encounter for closed fracture: Secondary | ICD-10-CM

## 2011-11-07 DIAGNOSIS — M84376A Stress fracture, unspecified foot, initial encounter for fracture: Secondary | ICD-10-CM

## 2011-11-17 ENCOUNTER — Telehealth: Payer: Self-pay | Admitting: Primary Care

## 2011-11-17 ENCOUNTER — Telehealth: Payer: Self-pay | Admitting: Neurology

## 2011-11-23 ENCOUNTER — Encounter: Payer: Self-pay | Admitting: Gastroenterology

## 2011-11-24 ENCOUNTER — Ambulatory Visit: Payer: Self-pay

## 2011-11-27 ENCOUNTER — Encounter: Payer: Self-pay | Admitting: Gastroenterology

## 2011-11-27 ENCOUNTER — Other Ambulatory Visit: Payer: Self-pay | Admitting: Neurology

## 2011-11-27 LAB — HM MAMMOGRAPHY

## 2011-11-28 ENCOUNTER — Ambulatory Visit: Payer: Self-pay | Admitting: Rehabilitative and Restorative Service Providers"

## 2011-11-28 DIAGNOSIS — S5290XA Unspecified fracture of unspecified forearm, initial encounter for closed fracture: Secondary | ICD-10-CM

## 2011-11-28 DIAGNOSIS — M25569 Pain in unspecified knee: Secondary | ICD-10-CM

## 2011-11-29 ENCOUNTER — Encounter: Payer: Self-pay | Admitting: Primary Care

## 2011-12-01 ENCOUNTER — Other Ambulatory Visit: Payer: Self-pay | Admitting: Primary Care

## 2011-12-01 DIAGNOSIS — J309 Allergic rhinitis, unspecified: Secondary | ICD-10-CM

## 2011-12-04 ENCOUNTER — Other Ambulatory Visit: Payer: Self-pay | Admitting: Primary Care

## 2011-12-04 DIAGNOSIS — N3281 Overactive bladder: Secondary | ICD-10-CM

## 2011-12-05 ENCOUNTER — Ambulatory Visit: Payer: Self-pay | Admitting: Rehabilitative and Restorative Service Providers"

## 2011-12-08 ENCOUNTER — Ambulatory Visit: Payer: Self-pay

## 2011-12-08 DIAGNOSIS — M545 Low back pain, unspecified: Secondary | ICD-10-CM

## 2011-12-11 ENCOUNTER — Ambulatory Visit: Payer: Self-pay | Admitting: Rehabilitative and Restorative Service Providers"

## 2011-12-11 DIAGNOSIS — M84376A Stress fracture, unspecified foot, initial encounter for fracture: Secondary | ICD-10-CM

## 2011-12-11 DIAGNOSIS — M25569 Pain in unspecified knee: Secondary | ICD-10-CM

## 2011-12-11 DIAGNOSIS — S5290XA Unspecified fracture of unspecified forearm, initial encounter for closed fracture: Secondary | ICD-10-CM

## 2011-12-12 ENCOUNTER — Ambulatory Visit: Payer: Self-pay | Admitting: Primary Care

## 2011-12-19 ENCOUNTER — Encounter: Payer: Self-pay | Admitting: Primary Care

## 2011-12-19 ENCOUNTER — Ambulatory Visit: Payer: Self-pay | Admitting: Rehabilitative and Restorative Service Providers"

## 2011-12-19 ENCOUNTER — Ambulatory Visit: Payer: Self-pay | Admitting: Primary Care

## 2011-12-19 VITALS — BP 106/78 | HR 66 | Ht 63.0 in | Wt 123.4 lb

## 2011-12-19 DIAGNOSIS — E559 Vitamin D deficiency, unspecified: Secondary | ICD-10-CM

## 2011-12-19 DIAGNOSIS — E039 Hypothyroidism, unspecified: Secondary | ICD-10-CM

## 2011-12-19 DIAGNOSIS — I10 Essential (primary) hypertension: Secondary | ICD-10-CM

## 2011-12-19 DIAGNOSIS — K219 Gastro-esophageal reflux disease without esophagitis: Secondary | ICD-10-CM

## 2011-12-19 MED ORDER — NON-SYSTEM MEDICATION *A*
Status: DC
Start: 2011-12-19 — End: 2011-12-20

## 2011-12-19 NOTE — Progress Notes (Signed)
Reason for Visit: Hypertension and Gastrophageal Reflux    Urge incontinence--She still has frequency.  She takes the enablex.  She wears a pad all the time.    GERD--taking aciphex which works if she sticks to a strict anti-reflux diet.  She will have breakthrough sx every two weeks or so.  She will take a pepcid and the sx will go away by morning.  SHe will seeing Dr. Aquilla Hacker soon.  She will talk to her about the colonoscopy which she is due for  Osteoporosis--had the injection at Dr. Doren Custard office, would be due for DEXA in Fall to monitor response to therapy.  IS continuing on ca/vitamin d  Hypothyroidism--no fatigue, hair thinning, changes in stool habits or unexplained changes in weight.  Taking levothyroxine as directed  htn--tolerating amlodipine, denies CP SOB dizziness or headache.    Patient Active Problem List   Diagnoses Code   . Reported Trauma Neck 9990   . Allergic Rhinitis 477.9   . Irritable Bowel Syndrome 564.1   . Hypertension 401.9   . Dysplastic Nevus 448.1   . Rosacea 695.3   . Osteoporosis 733.00   . Hypothyroidism 244.9   . Scoliosis 737.30   . Urge Incontinence Of Urine 788.31   . 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 Convulsive Disorder 780.39   . 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 Ringing In The Ears (Tinnitus) 388.30     Current Outpatient Prescriptions   Medication   . ENABLEX 15 MG 24 hr tablet   . ADVAIR DISKUS 250-50 MCG/DOSE diskus inhaler   . amLODIPine (NORVASC) 5 MG tablet   . TEGRETOL XR 100 MG 12 hr tablet   . nystatin (NYSTOP) 100000 UNIT/GM powder   . azelastine (ASTELIN) 137 MCG/SPRAY nasal spray   . rabeprazole (ACIPHEX) 20 MG tablet   . TEGRETOL XR 200 MG 12 hr tablet   . levothyroxine (SYNTHROID, LEVOTHROID) 25 MCG tablet   . NON-SYSTEM MEDICATION *A*   . Cholecalciferol (VITAMIN D) 1000 UNITS capsule   . NORITATE 1 % cream   . loratadine (CLARITIN) 10 MG tablet   . Multiple Vitamin (MULTIVITAMIN) per tablet   . Calcium  Carb-Cholecalciferol 600-200 MG-UNIT TABS       Medication list reviewed and updated, no changes were made today    Exam: BP 106/78  Pulse 66  Ht 1.6 m (5\' 3" )  Wt 55.974 kg (123 lb 6.4 oz)  BMI 21.86 kg/m2  Frail 56 yo in nad.  Lungs are CTA bilaterally, resp easy.  Cardiac exam reveals nl S1S2 RRR.  Abdomen is soft, nontender.  Extremities are without edema    A/P:  1.  GERD--sx relatively well controlled on current regimen of PPI with pepcid prn.  We have discussed the risks and benefits of BID PPI therapy, but in light of her osteoporosis, I think that it is in her best interest not to intensify therapy; pt is in agreement.  She will continue anti-reflux measures, and will be seeing Dr. Aquilla Hacker next month.  She is due for a colonoscopy and will speak with Dr. Aquilla Hacker about this at her appt  2. Osteoporosis-Dr. Hassell Halim has been ordering her DEXA scans.  I would expect that he will repeat this Fall to monitor her response to therapy.  3.  Urge incontinence--continue enablex and bladder training techniques.  4.  HTN--meets JNC7 guideline goal for BP under 140/90, continue current regimen  5.  Hypothyroidism--given req to have TSH checked  next month with her  Tegretol level

## 2011-12-20 ENCOUNTER — Other Ambulatory Visit: Payer: Self-pay | Admitting: Primary Care

## 2011-12-20 MED ORDER — NON-SYSTEM MEDICATION *A*
Status: DC
Start: 2011-12-20 — End: 2012-03-25

## 2011-12-20 NOTE — Telephone Encounter (Signed)
Pt misplaced script

## 2011-12-22 ENCOUNTER — Ambulatory Visit: Payer: Self-pay

## 2011-12-26 ENCOUNTER — Ambulatory Visit: Payer: Self-pay | Admitting: Rehabilitative and Restorative Service Providers"

## 2011-12-26 DIAGNOSIS — S5290XA Unspecified fracture of unspecified forearm, initial encounter for closed fracture: Secondary | ICD-10-CM

## 2011-12-26 DIAGNOSIS — M84376A Stress fracture, unspecified foot, initial encounter for fracture: Secondary | ICD-10-CM

## 2011-12-27 NOTE — Progress Notes (Signed)
Sports PT Daily Note    [name]   [id number]   Encounter Diagnoses   Name Primary?   . Stress fracture of the metatarsals Yes   . Closed fracture of unspecified part of radius with ulna          Subjective:  Pain Score:  0  Pain: Improved, no complaint of pain on this date, complains of mainly continued balance deficits      Objective:  ROM - Unchanged  Strength - Unchanged  Function: - Unchanged  Education:  Verbal cues for ther ex    Objective       Manual stretching wrist flex, ext, sup, pronation  5 min        Gait training  6 lengths   Agility ladder side steps     Agility ladder  4 lengths    Agility ladder with balance perturbations 2 lengths   Hurdle step overs     Marching         Rockerboard lateral weight shifts with airex     Leg Press         Rice bucket finger flex/ext  30x    Rice bucket wrist sup/pro  30x    Flexbar wrist flex/ext  30x    Flexbar wrist pro/sup  30x    Dumbbell wrist pro/sup  30x 5lbs            QS     SLR     Standing calf stretch with wedge     Calf raises 30x         Treatment Modalities:  NA     Assessment:  Patient responding favorably    Plan of Care:  Continue per Plan of care -  As written    Thank you for referring this patient to [facility]    [employer]

## 2012-01-01 ENCOUNTER — Ambulatory Visit: Payer: Self-pay | Admitting: Rehabilitative and Restorative Service Providers"

## 2012-01-01 DIAGNOSIS — S5290XA Unspecified fracture of unspecified forearm, initial encounter for closed fracture: Secondary | ICD-10-CM

## 2012-01-01 DIAGNOSIS — M84376A Stress fracture, unspecified foot, initial encounter for fracture: Secondary | ICD-10-CM

## 2012-01-01 NOTE — Progress Notes (Signed)
Sports PT Daily Note    KATREENA ACHEY   [id number]   Encounter Diagnoses   Name Primary?   . Stress fracture of the metatarsals Yes   . Closed fracture of unspecified part of radius with ulna          Subjective:  Pain Score:  0  Pain: Unchanged, reports difficultly with amb in heavy snow      Objective:  ROM - Unchanged  Strength - Unchanged  Function: - Unchanged  Education:  Verbal cues for ther ex    Objective       Manual stretching wrist flex, ext, sup, pronation  5 min        Gait training  6 lengths   Agility ladder side steps     Agility ladder  4 lengths    Agility ladder with balance perturbations 2 lengths   Hurdle step overs     Marching         Rockerboard lateral weight shifts with airex     Leg Press  2.5-3.5 3x10   Treadmill 1.3   Rice bucket finger flex/ext  30x    Rice bucket wrist sup/pro  30x    Flexbar wrist flex/ext  30x    Flexbar wrist pro/sup  30x    Dumbbell wrist pro/sup  30x 5lbs            QS     SLR     Standing calf stretch with wedge     Calf raises 30x         Treatment Modalities:  NA     Assessment:  Patient responding favorably    Plan of Care:  Continue per Plan of care -  As written    Thank you for referring this patient to [facility]    [employer]

## 2012-01-02 ENCOUNTER — Ambulatory Visit: Payer: Self-pay | Admitting: Rehabilitative and Restorative Service Providers"

## 2012-01-04 ENCOUNTER — Ambulatory Visit: Payer: Self-pay | Admitting: Physical Therapy

## 2012-01-04 NOTE — Progress Notes (Signed)
Subjective:  [name] is referred by Noni Saupe, PT for consultation to treat foot and ankle problems.  [name] has a >10 year history of right achilles and foot problems.  Describes an incident several years ago stepping off a curb on the right and pain since.  At one point Dr. Theador Hawthorne instructed her to never walk without her orthotics and shoes.  Even around the house.  Patient currently wears UCBL style orthotics on both feet.  She has triple A narrow feet.  However, the orthotic stretches out her shoe, resulting in poor contact in her forefoot.  The result is her foot moves side to side relative to the shoe, which limits her walking.   Presently she is limited to ~ 5-10 m per her self report.      PMH- Her medical history is extensive.  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   Patient has had extensive physical therapy.      Objective:    ROM  DF ROM - L = 0, R = 5, PF L = 35, R = 30 Eversion R = 50 %, L = 80-100 %, big toe DF < 30 degrees bilaterally    Strength - eversion 4/5, inversion 4/5, DF 5/5, PF tested seated 5/5, paper pull test = bilaterally    Standing:  Orthotics on and off - off :  Left foot is pronated with mild degree of hindfoot valgus.  Right foot is supinated with mild degree of hindfoot varus (peek a boo sign is present on right).      Treatment:  1.  Suggested a trial of lowering the depth of the orthotic - this may succeed if she can control the movement at her ankles  2.  Started her on the following HEP   A.  Seated DF/PF 10-15 sec holds x 10    B.  Inversion/eversion seated orange x 15 3 sets   C.  Scrunches x 15 3 sets    D.  Gas pedals seated 3 sets x 15 reps  3.  Education - 50/50 possibility of improvement in terms of function    Assessment:  Patient with  long standing complaints of pain and multiple overlapping problems.      Plan:    1.  I will follow the patient for ~3 visits   2.  Trial of orthotics without medial and lateral supports  3.  HEP to improve DF/PF ROM  4.  HEP to improve Med/lat and for/aft strength at ankle    Vear Clock, PT, PhD

## 2012-01-05 ENCOUNTER — Ambulatory Visit: Payer: Self-pay

## 2012-01-05 DIAGNOSIS — M545 Low back pain, unspecified: Secondary | ICD-10-CM

## 2012-01-05 NOTE — Progress Notes (Signed)
Sports PT Daily Note    [name]   [id number]   Encounter Diagnosis   Name Primary?    Low back pain Yes         Subjective:  Pain Score:  1  Pain: Improved, "not bad"    Objective:  ROM - NT this visit  Strength - Improved,  Per Ther Ex flowsheet  Function: - Unchanged, has begun working with another therapist for her fett  Education:  Verbal cues for ther ex, Spine ed concepts    Objective         Treatment Modalities:  NA    Assessment:  Patient responding favorably, she is going to continue with HEP and increase reps. She will follow-up if needed.    Plan of Care:  Continue per Plan of care -  As written    Thank you for referring this patient to [facility]    [facility]    Exercise Repetitions 08/18/11 09/29/11 10/06/11 10/20/11 10/27/11 11/24/11 12/08/11 01/05/12   Ab/glute brace x30 each x15 x30 Seated x30 - x40 x30 x30 x30   Seated marches x30 x10 x30 x30 x30 x40 x30 x30 x30   Seated knee extensions     X30, noted numbness down right leg with this       Seated sciatic nerve glide     x7       Standing marches x30 x10 x15 x30 x30   x30 x30   Table plank Unable secondary to ankle issues -          Curl-up x15 x10 x15 2x15 2x15  2x15 2x15 2x15   Bridge 5"x15 x8 x15 x30 L2 orange x10  L2 orange x15 L2 orange x L2 orange x30   Recumbent bike 3' 2' 2.5' 3' 3.5'  (3 holes showing) 6.5'  3 holes showing x6' 5'   Clamshell x10 x10 right, x15 left x20 x30 Orange x15  Orange x30 right, x10 left secondary to hip pain from laying on side x30 without band x30 right with orange band   Scap squeezes  x15 x30 Orange x20 Orange x30 Orange x40 seated Orange x40 seated Orange x40 seates Green x30   Seated upper trapezius stretch  3x15" 3x15" 3x15" 3x15" 4x15" 4x15" 4x15" -

## 2012-01-08 ENCOUNTER — Ambulatory Visit: Payer: Self-pay | Admitting: Rehabilitative and Restorative Service Providers"

## 2012-01-08 ENCOUNTER — Ambulatory Visit
Admit: 2012-01-08 | Discharge: 2012-01-08 | Disposition: A | Discharge status: Home / Self Care | Payer: Self-pay | Source: Ambulatory Visit | Attending: Primary Care | Admitting: Primary Care

## 2012-01-08 DIAGNOSIS — E039 Hypothyroidism, unspecified: Secondary | ICD-10-CM

## 2012-01-08 DIAGNOSIS — I10 Essential (primary) hypertension: Secondary | ICD-10-CM

## 2012-01-08 LAB — BASIC METABOLIC PANEL
Anion Gap: 11 (ref 7–16)
CO2: 28 mmol/L (ref 20–28)
Calcium: 9.2 mg/dL (ref 8.6–10.2)
Chloride: 101 mmol/L (ref 96–108)
Creatinine: 0.6 mg/dL (ref 0.51–0.95)
GFR,Black: 116 *
GFR,Caucasian: 101 *
Glucose: 93 mg/dL (ref 60–99)
Lab: 20 mg/dL (ref 6–20)
Potassium: 4.8 mmol/L (ref 3.3–5.1)
Sodium: 140 mmol/L (ref 133–145)

## 2012-01-08 LAB — MULTIPLE ORDERING DOCS

## 2012-01-08 LAB — CARBAMAZEPINE: Carbamazepine: 8.7 ug/mL (ref 4.0–12.0)

## 2012-01-08 LAB — TSH: TSH: 1.86 u[IU]/mL (ref 0.27–4.20)

## 2012-01-09 ENCOUNTER — Ambulatory Visit: Payer: Self-pay | Admitting: Rehabilitative and Restorative Service Providers"

## 2012-01-09 ENCOUNTER — Ambulatory Visit: Payer: Self-pay | Admitting: Physical Therapy

## 2012-01-09 NOTE — Progress Notes (Signed)
Subjective: Difficulty with one exercise   .   Objective:   ROM - ball rolls patient is unable to control foot deformation (pronation/supination) on ball  Strength - per sheet below, needs cues to perform exercises  Movement Analysis - frequently leans knee medially to execute distal exercises in the seated position    Treatment:   Following HEP   A. Seated DF/PF 10-15 sec holds x 10   B. Inversion/eversion seated orange x 15 3 sets   C. Scrunches x 15 3 sets   D. Gas pedals seated 3 sets x 15 reps   E. Seated calf raises 3 x 15 reps    Assessment: Patient with long standing complaints of pain and multiple overlapping problems.   Plan:   1. I will follow the patient for ~3 visits   2.. HEP to improve DF/PF ROM   3. HEP to improve Med/lat and for/aft strength at ankle     Vear Clock, PT, PhD

## 2012-01-10 LAB — VITAMIN D
25-OH VIT D2: <4 ng/mL
25-OH VIT D3: 38 ng/mL
25-OH Vit Total: 38 ng/mL (ref 30–80)

## 2012-01-15 ENCOUNTER — Ambulatory Visit: Payer: Self-pay | Admitting: Rehabilitative and Restorative Service Providers"

## 2012-01-16 ENCOUNTER — Ambulatory Visit: Payer: Self-pay | Admitting: Physical Therapy

## 2012-01-16 ENCOUNTER — Ambulatory Visit: Payer: Self-pay | Admitting: Rehabilitative and Restorative Service Providers"

## 2012-01-16 ENCOUNTER — Encounter: Payer: Self-pay | Admitting: Neurology

## 2012-01-16 ENCOUNTER — Ambulatory Visit: Payer: Self-pay | Admitting: Neurology

## 2012-01-16 VITALS — BP 122/58 | HR 79 | Ht 63.0 in | Wt 123.2 lb

## 2012-01-16 DIAGNOSIS — R569 Unspecified convulsions: Secondary | ICD-10-CM

## 2012-01-16 NOTE — Patient Instructions (Signed)
You were seen today in follow up of your seizures.    You have been stable, with no change in your chronic level of sedation secondary to medications.    Continue Tegretol 300mg  twice daily.     Follow up 6 months.

## 2012-01-17 ENCOUNTER — Other Ambulatory Visit: Payer: Self-pay | Admitting: Primary Care

## 2012-01-17 DIAGNOSIS — J309 Allergic rhinitis, unspecified: Secondary | ICD-10-CM

## 2012-01-18 NOTE — Progress Notes (Signed)
Overall, she has been doing well since her last visit in July.  She has had no recurrent seizure activity.  She believes that her level of alertness and energy are unchanged.  Her carbamazepine levels tend to run between 8 and 10 without clear correlation with her reported level of alertness.    She is slightly increased her workload and states this has been going well.  She continues to receive physical therapy for her history of a fractured ankle and ongoing balance difficulties.    A 16 point review of systems identifies some mild heartburn, and at this, occasional headache, and stress incontinence.  She recently hit her head in the car and had some mild headache and neck pain from that.  She did not have any loss of consciousness or alteration in mentation.    Examination:  Filed Vitals:    01/16/12 0925   BP: 122/58   Pulse: 79   Height: 1.6 m (5\' 3" )   Weight: 55.883 kg (123 lb 3.2 oz)     Mental status: She is alert and oriented.  Speech and comprehension are intact.  Affect is appropriate.  Cranial nerves: Versions are full without nystagmus.  Visual fields are full.  There is no facial weakness.  She has good strength throughout.  She continues to have a mild suspension tremor slightly worse on the left today.  She has a very subtle tremor of the head.  Gait remains wide based and tentative but unchanged.    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  She has been stable on her current dose of Tegretol.  She is not interested in changing medications at this time.  She'll continue on her current level of physical therapy.  I plan seeing her again in 6 months.

## 2012-01-19 ENCOUNTER — Ambulatory Visit: Payer: Self-pay

## 2012-01-29 ENCOUNTER — Encounter: Payer: Self-pay | Admitting: Gastroenterology

## 2012-01-30 ENCOUNTER — Ambulatory Visit: Payer: Self-pay | Admitting: Physical Therapy

## 2012-01-30 ENCOUNTER — Telehealth: Payer: Self-pay | Admitting: Physical Therapy

## 2012-01-31 ENCOUNTER — Other Ambulatory Visit: Payer: Self-pay | Admitting: Primary Care

## 2012-01-31 DIAGNOSIS — E039 Hypothyroidism, unspecified: Secondary | ICD-10-CM

## 2012-01-31 MED ORDER — LEVOTHYROXINE SODIUM 25 MCG PO TABS *I*
ORAL_TABLET | ORAL | Status: DC
Start: 2012-01-31 — End: 2012-04-28

## 2012-02-06 ENCOUNTER — Ambulatory Visit: Payer: Self-pay | Admitting: Physical Therapy

## 2012-02-06 NOTE — Progress Notes (Signed)
Subjective: Able to do the HEP below - notes stiffness in R foot, continued balance and significant fear of re-injury.     Objective:   ROM - ball rolls patient is unable to control foot deformation (pronation/supination) on ball   Strength - per sheet below, needs cues to perform exercises   Movement Analysis - Single leg stand on right patient requires UE support and lateral trunk lean to do hip abd    Treatment:   Following HEP   A. Seated DF/PF 10-15 sec holds x 10   B. Inversion/eversion seated orange x 15 3 sets   C. Scrunches x 15 3 sets   D. Gas pedals seated 3 sets x 15 reps   E. Seated calf raises 3 x 15 reps   Added  F.  Standing hip abd 3 x15  G.  Slant board eversion exercise 3 x 15  H.  Squats with UE support 3 x 10   I.  Self mobilization of R heel (inversion/eversion) 3 x 10 AND PF  J.  Seated ankle PF stretch 3 x 10 15 s holds    Assessment: Tolerated very well.  Clear weakness in right hip abuctors and restricted PF bilaterally at ankles    Plan:   1. I will follow the patient for ~3 visits   2.. HEP to improve DF/PF ROM   3. HEP to improve Med/lat and for/aft strength at ankle   4.  Balance and proximal control exercises    Vear Clock, PT, PhD

## 2012-02-15 ENCOUNTER — Ambulatory Visit: Payer: Self-pay | Admitting: Physical Therapy

## 2012-02-20 ENCOUNTER — Ambulatory Visit: Payer: Self-pay | Admitting: Physical Therapy

## 2012-02-20 NOTE — Progress Notes (Signed)
Subjective: Some low back pain, otherwise able to do exercise, feel like things are happening    Objective:   ROM - limited by observation of PF/DF   Strength - per sheet below, needs cues to perform exercises   Movement Analysis - Single leg stand on right patient requires UE support and lateral trunk lean to do hip abd   Gait - worked on stride length and heel toe walking    Treatment:   Following HEP   A. Seated DF/PF 10-15 sec holds x 10 - patient instructed to push harder into DF and PF  B. Inversion/eversion seated orange x 15 3 sets   F.  Self mobilization hindfoot seated 20 x  G.  PF stretch seated 20 s x 5   H. Standing hip abd 3 x15   J. Squats with UE support 2 x 10     Started functional training  1.  Step ups r & l 2 x 10 with UE support  2.  Stepping side to side over obstacle 2 x 1 min (25 each time) lost balance once or twice - focused on NOT looking down)  3.  Heel land to foot flat with UE support    Assessment: Tolerated very well. Cont to work on ankle and hips to progress walking stability and fnuction  Plan:   1. Follow up with Thayer Ohm blankenbe  2.   Recommend continued focus on functional training and stretching of right foot and ankle.  Proximal control also an issue on right.    Vear Clock, PT, PhD

## 2012-03-07 ENCOUNTER — Ambulatory Visit: Payer: Self-pay | Admitting: Rehabilitative and Restorative Service Providers"

## 2012-03-07 DIAGNOSIS — S5290XA Unspecified fracture of unspecified forearm, initial encounter for closed fracture: Secondary | ICD-10-CM

## 2012-03-07 DIAGNOSIS — M84376A Stress fracture, unspecified foot, initial encounter for fracture: Secondary | ICD-10-CM

## 2012-03-07 NOTE — Progress Notes (Signed)
SPORTS REHABILITATION PT LE STATUS    History  Diagnosis:   Encounter Diagnoses   Name Primary?   . Stress fracture of the metatarsals Yes   . Closed fracture of unspecified part of radius with ulna        Subjective    [name] is a 56 y.o. female who is present today for continued treatment of her B UE/LE weakness and balance deficits.  Mechanism of injury/history of symptoms: No specific cause    Attendance:  Good    Compliance:  Good   Relevant symptoms: Pain , Decreased ROM, Decreased strength, Instability, balance deficits    Symptom frequency: Persistent  Symptom intensity (0 - 10 scale): Now 0 Best 0 Worst 3  Complains of L > R wrist tightness/soreness with ADL's    Patient's goals for therapy: Reduce pain, Increase ROM, Increase strength, Independent with home program, improve balance     Objective:    Observation: WNL   Palpation:  WNL     ROM/Strength:    bilateral knee, bilateral ankle          LE LEFT   RIGHT   Strength    PROM AROM PROM AROM Left Right   Knee ext     4/5 4/5   Knee flex     4/5 4/5   Ankle DF     4/5 4/5   Ankle PF     3+/5 3+/5            Wrist flex     4/5 4/5   Wrist ext     4/5 4/5   Wrist sup/pro     4/5 4/5     Unable to maintain SLS for greater than 5 sec on L or R LE's      Special Tests:   Hip: N/A     Knee: N/A     Ankle: N/A     Functional:  Perform self-care activities/basic ADLs - able to perform.  Walk more than 10 minutes - unable to perform.  Ascend stairs with reciprocal gait - unable to perform.  Descend stairs with reciprocal gait - unable to perform.    Assessment  Findings consistent with 56 y.o., female with   Encounter Diagnoses   Name Primary?   . Stress fracture of the metatarsals Yes   . Closed fracture of unspecified part of radius with ulna      with ROM limitations, strength limitations, functional limitations, balance deficits    Prognosis:  Fair    Contraindications/Precautions/Limitation:  Per diagnosis  Long Term Goals:  Pain/Sx 0 - minimal,  ROM/ flexibility WNL , Restoration of functional strength, Functional return to ADLs / activites without limitation        Treatment Plan:   Patient/family involved in developing goals and treatment plan: yes  Freq 1 times per week(s) for 3 month(s)     Exercise: AROM, AAROM, PROM, Progressive Resistive   Manual Techniques:  N/A   Modalities:  NA    Functional: Proprioception/Dynamic stability, Functional rehab       Thank you for referring this patient to [facility].    [name]    Manual stretching wrist flex, ext, sup, pronation  5 min        Gait training  5 min    Agility ladder side steps     Agility ladder  10 lengths    Hurdle step overs     Marching  Balance training with perturbations  5 min       Rockerboard lateral weight shifts with airex  30x    Leg Press         Rice bucket finger flex/ext     Rice bucket wrist sup/pro     Flexbar wrist flex/ext  30x red   Flexbar wrist pro/sup  30x red   Dumbbell wrist pro/sup  30x 5lbs            QS     SLR     Standing calf stretch with wedge     Calf raises  30x

## 2012-03-19 ENCOUNTER — Ambulatory Visit: Payer: Self-pay | Admitting: Rehabilitative and Restorative Service Providers"

## 2012-03-21 ENCOUNTER — Ambulatory Visit: Payer: Self-pay | Admitting: Rehabilitative and Restorative Service Providers"

## 2012-03-21 DIAGNOSIS — S5290XA Unspecified fracture of unspecified forearm, initial encounter for closed fracture: Secondary | ICD-10-CM

## 2012-03-21 DIAGNOSIS — M84376A Stress fracture, unspecified foot, initial encounter for fracture: Secondary | ICD-10-CM

## 2012-03-21 NOTE — Progress Notes (Signed)
Sports PT Daily Note    [place] BURROW   [id number]   Encounter Diagnoses   Name Primary?   . Stress fracture of the metatarsals Yes   . Closed fracture of unspecified part of radius with ulna          Subjective:  Pain Score:  0  Pain: Unchanged, no complaint of wrist or ankle pain on this date    Objective:  ROM - Unchanged, Bilateral, Wrist, AROM  Strength - Ther Ex per flowsheet  Function: - Improved, added cybex LE equipment  Education:  Verbal cues for ther ex    Objective     see flowsheet     Treatment Modalities:  Ther Exercise per flowsheet    Assessment:  Patient responding favorably    Plan of Care:  Continue per Plan of care -  As written    Thank you for referring this patient to [facility]    [facility]        Manual stretching wrist flex, ext, sup, pronation  5 min        Gait training  5 min    Agility ladder side steps     Agility ladder  10 lengths    Hurdle step overs     Marching     Balance training with perturbations         Rockerboard lateral weight shifts with airex     Leg Press  50 lbs 3x10   HS curl 10lbs 3x10   SAQ 10lbs 3x10       Rice bucket finger flex/ext     Rice bucket wrist sup/pro     Flexbar wrist flex/ext  30x red   Flexbar wrist pro/sup  30x red   Dumbbell wrist pro/sup  30x 5lbs            QS     SLR     Standing calf stretch with wedge     Calf raises  30x

## 2012-03-25 ENCOUNTER — Other Ambulatory Visit: Payer: Self-pay | Admitting: Primary Care

## 2012-03-25 MED ORDER — NON-SYSTEM MEDICATION *A*
Status: DC
Start: 2012-03-25 — End: 2014-01-30

## 2012-03-26 ENCOUNTER — Ambulatory Visit: Payer: Self-pay | Admitting: Rehabilitative and Restorative Service Providers"

## 2012-03-26 DIAGNOSIS — M84376A Stress fracture, unspecified foot, initial encounter for fracture: Secondary | ICD-10-CM

## 2012-03-26 DIAGNOSIS — S5290XA Unspecified fracture of unspecified forearm, initial encounter for closed fracture: Secondary | ICD-10-CM

## 2012-03-26 NOTE — Progress Notes (Signed)
Sports PT Daily Note    [name]   [id number]   Encounter Diagnoses   Name Primary?   . Stress fracture of the metatarsals Yes   . Closed fracture of unspecified part of radius with ulna          Subjective:  Pain Score:  0  Pain: Unchanged, reports increased foot pain secondary to modification made to orthotics     Objective:  ROM - Unchanged, Bilateral, Wrist, AROM  Strength - Ther Ex per flowsheet  Function: - Unchanged  Education:  Verbal cues for ther ex    Objective     see flowsheet     Treatment Modalities:  Ther Exercise per flowsheet    Assessment:  Patient responding favorably    Plan of Care:  Continue per Plan of care -  As written    Thank you for referring this patient to [facility]    [facility]        Manual stretching wrist flex, ext, sup, pronation  5 min        Gait training  5 min    Agility ladder side steps     Agility ladder  10 lengths    Hurdle step overs     Marching     Balance training with perturbations         Rockerboard lateral weight shifts with airex     Leg Press  50 lbs 3x10   HS curl 10lbs 3x10   SAQ 10lbs 3x10       Rice bucket finger flex/ext     Rice bucket wrist sup/pro     Flexbar wrist flex/ext  30x red   Flexbar wrist pro/sup  30x red   Dumbbell wrist pro/sup  30x 5lbs            QS     SLR     Standing calf stretch with wedge     Calf raises  30x

## 2012-04-02 ENCOUNTER — Ambulatory Visit: Payer: Self-pay | Admitting: Rehabilitative and Restorative Service Providers"

## 2012-04-02 DIAGNOSIS — M84376A Stress fracture, unspecified foot, initial encounter for fracture: Secondary | ICD-10-CM

## 2012-04-02 NOTE — Progress Notes (Signed)
Sports PT Daily Note    [name]   [id number]   Encounter Diagnoses   Name Primary?   . Stress fracture of the metatarsals Yes   . Closed fracture of unspecified part of radius with ulna          Subjective:  Pain Score:  0  Pain: Unchanged, reports continued foot pain secondary to modification made to orthotics, Also, quad soreness secondary to the cybex equipment     Objective:  ROM - Unchanged, Bilateral, Wrist, AROM  Strength - Ther Ex per flowsheet  Function: - Unchanged  Education:  Verbal cues for ther ex    Objective     see flowsheet     Treatment Modalities:  Ther Exercise per flowsheet    Assessment:  Patient responding favorably    Plan of Care:  Continue per Plan of care -  As written    Thank you for referring this patient to [facility] and Sp[facility] Rehabilitation    [facility]        Manual stretching wrist flex, ext, sup, pronation  5 min        Gait training  5 min    Agility ladder side steps     Agility ladder     Hurdle step overs     Marching     Balance training with perturbations  5'       Rockerboard lateral weight shifts with airex     Leg Press  50 lbs 3x10   HS curl 10lbs 3x10   SAQ 10lbs 3x10       Rice bucket finger flex/ext     Rice bucket wrist sup/pro     Flexbar wrist flex/ext  30x red   Flexbar wrist pro/sup  30x red   Dumbbell wrist pro/sup  30x 5lbs            QS     SLR     Standing calf stretch with wedge     Calf raises  30x

## 2012-04-09 ENCOUNTER — Ambulatory Visit: Payer: Self-pay | Admitting: Rehabilitative and Restorative Service Providers"

## 2012-04-16 ENCOUNTER — Ambulatory Visit: Payer: Self-pay | Admitting: Rehabilitative and Restorative Service Providers"

## 2012-04-16 DIAGNOSIS — S5290XA Unspecified fracture of unspecified forearm, initial encounter for closed fracture: Secondary | ICD-10-CM

## 2012-04-16 DIAGNOSIS — M84376A Stress fracture, unspecified foot, initial encounter for fracture: Secondary | ICD-10-CM

## 2012-04-16 NOTE — Progress Notes (Signed)
Sports PT Daily Note    [name]   [id number]   Encounter Diagnoses   Name Primary?   . Stress fracture of the metatarsals Yes   . Closed fracture of unspecified part of radius with ulna          Subjective:  Pain Score:  0  Pain: Unchanged, reports continued foot pain secondary to modification made to orthotics    Objective:  ROM - Unchanged, Bilateral, Wrist, AROM  Strength - Ther Ex per flowsheet  Function: - Unchanged  Education:  Verbal cues for ther ex    Objective     see flowsheet     Treatment Modalities:  Ther Exercise per flowsheet    Assessment:  Patient responding favorably    Plan of Care:  Continue per Plan of care -  As written    Thank you for referring this patient to [facility] Spine [facility]    [facility]        Manual stretching wrist flex, ext, sup, pronation  5 min        Gait training  5 min    Agility ladder side steps     Agility ladder     Hurdle step overs     Marching     Balance training with perturbations  5'       Rockerboard lateral weight shifts with airex     Leg Press  50 lbs 3x10   HS curl 10lbs 3x10   SAQ 10lbs 3x10       Rice bucket finger flex/ext     Rice bucket wrist sup/pro     Flexbar wrist flex/ext  30x red   Flexbar wrist pro/sup  30x red   Dumbbell wrist pro/sup  30x 5lbs            QS     SLR     Standing calf stretch with wedge     Calf raises  30x

## 2012-04-18 ENCOUNTER — Telehealth: Payer: Self-pay | Admitting: Primary Care

## 2012-04-18 ENCOUNTER — Ambulatory Visit
Admit: 2012-04-18 | Discharge: 2012-04-18 | Disposition: A | Discharge status: Home / Self Care | Payer: Self-pay | Source: Ambulatory Visit | Attending: Gynecology | Admitting: Gynecology

## 2012-04-18 ENCOUNTER — Telehealth: Payer: Self-pay

## 2012-04-18 LAB — BASIC METABOLIC PANEL
Anion Gap: 11 (ref 7–16)
CO2: 26 mmol/L (ref 20–28)
Calcium: 9.3 mg/dL (ref 8.6–10.2)
Chloride: 103 mmol/L (ref 96–108)
Creatinine: 0.75 mg/dL (ref 0.51–0.95)
GFR,Black: 101 *
GFR,Caucasian: 88 *
Glucose: 96 mg/dL (ref 60–99)
Lab: 22 mg/dL — ABNORMAL HIGH (ref 6–20)
Potassium: 4.6 mmol/L (ref 3.3–5.1)
Sodium: 140 mmol/L (ref 133–145)

## 2012-04-18 NOTE — Telephone Encounter (Signed)
Pt called and notified

## 2012-04-18 NOTE — Telephone Encounter (Signed)
Pt is on Tegretol and was rx diflucan by GYN.  Pharmacy is telling her there is an interaction of the medication being enhanced with use together.  She is wondering if she should take the diflucan or if you have any other advise.

## 2012-04-18 NOTE — Telephone Encounter (Signed)
She should ask the gyn since that is who prescribed it, as I did not examine her for this problem, so I can't really weight the risks/benefits.  There are topical agents that can be used--perhaps that's an option.  She should d/w the prescriber.

## 2012-04-18 NOTE — Telephone Encounter (Signed)
Patient would like to discuss meds she is put on for yeast infection.  Pharmacist told her there may be minimal reaction w tegritol and she is not sure if she should take it

## 2012-04-18 NOTE — Telephone Encounter (Signed)
Patient prescribed single dose oral fluconazole for yeast infection. Concerned about interaction with Tegretol. Per pharmacist there may be slight risk of interaction. As it is single dose and she has been stable for an extended period of time, advised she take the medication to treat the infection.  Gaylynn Seiple Conception Oms, NP

## 2012-04-22 ENCOUNTER — Ambulatory Visit: Payer: Self-pay | Admitting: Rehabilitative and Restorative Service Providers"

## 2012-04-22 NOTE — Progress Notes (Signed)
SPORTS REHABILITATION PT LE STATUS      Subjective    [name] is a 56 y.o. female who is present today for continued treatment of her B UE/LE weakness and balance deficits.  Mechanism of injury/history of symptoms: No specific cause    Attendance:  Good    Compliance:  Good   Relevant symptoms: Pain , Decreased ROM, Decreased strength, Instability, balance deficits    Symptom frequency: Persistent  Symptom intensity (0 - 10 scale): Now 0 Best 0 Worst 3  Complains of L > R wrist tightness/soreness with ADL's    Patient's goals for therapy: Reduce pain, Increase ROM, Increase strength, Independent with home program, improve balance     Objective:    Observation: WNL   Palpation:  WNL     ROM/Strength:    bilateral knee, bilateral ankle          LE LEFT   RIGHT   Strength    PROM AROM PROM AROM Left Right   Knee ext     4/5 4/5   Knee flex     4/5 4/5   Ankle DF     4/5 4/5   Ankle PF     3+/5 3+/5            Wrist flex     4/5 4/5   Wrist ext     4/5 4/5   Wrist sup/pro     4/5 4/5     Continued inability to maintain SLS for greater than 5 sec on L or R LE's      Special Tests:   Hip: N/A     Knee: N/A     Ankle: N/A     Functional:  Perform self-care activities/basic ADLs - able to perform.  Walk more than 10 minutes - unable to perform.  Ascend stairs with reciprocal gait - unable to perform.  Descend stairs with reciprocal gait - unable to perform.    Assessment  Findings consistent with 56 y.o., female with   No diagnosis found.  with ROM limitations, strength limitations, functional limitations, balance deficits    Prognosis:  Fair    Contraindications/Precautions/Limitation:  Per diagnosis  Long Term Goals:  Pain/Sx 0 - minimal, ROM/ flexibility WNL , Restoration of functional strength, Functional return to ADLs / activites without limitation        Treatment Plan:   Patient/family involved in developing goals and treatment plan: yes  Freq 1 times per week(s) for 3 month(s)     Exercise: AROM, AAROM,  PROM, Progressive Resistive   Manual Techniques:  N/A   Modalities:  NA    Functional: Proprioception/Dynamic stability, Functional rehab       Thank you for referring this patient to [facility] and [facility].    Elli Groesbeck      Manual stretching wrist flex, ext, sup, pronation  5 min        Gait training  5 min    Agility ladder side steps     Agility ladder     Hurdle step overs     Marching  10x B    Balance training with perturbations  5'        Rockerboard lateral weight shifts with airex  30x   Leg Press  50 lbs 3x10    HS curl  10lbs 3x10    SAQ  10lbs 3x10        Rice bucket finger flex/ext  Rice bucket wrist sup/pro     Flexbar wrist flex/ext  30x red    Flexbar wrist pro/sup  30x red    Dumbbell wrist pro/sup  30x 5lbs            QS     SLR     Standing calf stretch with wedge     Calf raises  30x

## 2012-04-24 ENCOUNTER — Telehealth: Payer: Self-pay | Admitting: Primary Care

## 2012-04-24 ENCOUNTER — Ambulatory Visit
Admit: 2012-04-24 | Discharge: 2012-04-24 | Disposition: A | Discharge status: Home / Self Care | Payer: Self-pay | Source: Ambulatory Visit | Attending: Primary Care | Admitting: Primary Care

## 2012-04-24 DIAGNOSIS — R300 Dysuria: Secondary | ICD-10-CM

## 2012-04-24 LAB — URINALYSIS WITH MICROSCOPIC
Ketones, UA: NEGATIVE
Nitrite,UA: NEGATIVE
Protein,UA: NEGATIVE mg/dL
RBC,UA: 5 /hpf — AB (ref 0–2)
Specific Gravity,UA: 1.018 (ref 1.002–1.030)
WBC,UA: 14 /hpf — ABNORMAL HIGH (ref 0–5)
pH,UA: 6 (ref 5.0–8.0)

## 2012-04-24 MED ORDER — NITROFURANTOIN MONOHYD MACRO 100 MG PO CAPS *I*
100.0000 mg | ORAL_CAPSULE | Freq: Two times a day (BID) | ORAL | Status: AC
Start: 2012-04-24 — End: 2012-05-01

## 2012-04-24 NOTE — Telephone Encounter (Signed)
Pt called and notified

## 2012-04-24 NOTE — Telephone Encounter (Signed)
D/w Herbert Seta.  Doxy is not usually used for UTI (pt requested doxy).  REcommend macrobid (nitrofurantoin).  Will order x 7 days.

## 2012-04-24 NOTE — Telephone Encounter (Signed)
Pt called on tues night with vaginal itching and possible dysuria.  No frequency, no pelvic pressure.  Had yeast infection and tried monistat without relief, then was given diflucan by GYN.  Not sure now if she has a uti.    Advised urinalysis and cx, if neg, needs appt for gyn exam

## 2012-04-25 LAB — AEROBIC CULTURE: Aerobic Culture: 0

## 2012-04-28 ENCOUNTER — Other Ambulatory Visit: Payer: Self-pay | Admitting: Primary Care

## 2012-04-28 DIAGNOSIS — E039 Hypothyroidism, unspecified: Secondary | ICD-10-CM

## 2012-05-01 LAB — HM PAP SMEAR

## 2012-05-06 ENCOUNTER — Ambulatory Visit: Payer: Self-pay | Admitting: Rehabilitative and Restorative Service Providers"

## 2012-05-06 DIAGNOSIS — M84376A Stress fracture, unspecified foot, initial encounter for fracture: Secondary | ICD-10-CM

## 2012-05-06 DIAGNOSIS — S5290XA Unspecified fracture of unspecified forearm, initial encounter for closed fracture: Secondary | ICD-10-CM

## 2012-05-06 NOTE — Progress Notes (Signed)
Sports PT Daily Note    JEUNE MUSLEH   [id number]   Encounter Diagnoses   Name Primary?   . Stress fracture of the metatarsals Yes   . Closed fracture of unspecified part of radius with ulna          Subjective:  Pain Score:  0  Pain: Unchanged, reports continued foot pain secondary to modification made to orthotics    Objective:  ROM - Unchanged, Bilateral, Wrist, AROM  Strength - Ther Ex per flowsheet  Function: - Improved balance with hurdle stepping  Education:  Verbal cues for ther ex    Objective     see flowsheet     Treatment Modalities:  Ther Exercise per flowsheet    Assessment:  Patient responding favorably    Plan of Care:  Continue per Plan of care -  As written    Thank you for referring this patient to [facility] and Spine [facility]    [facility]        Manual stretching wrist flex, ext, sup, pronation  5 min        Gait training  5 min    Agility ladder side steps     Agility ladder     Hurdle step overs     Marching     Balance training with perturbations  5'       Rockerboard lateral weight shifts with airex     Leg Press  50 lbs 3x10   HS curl 10lbs 3x10   SAQ 10lbs 3x10       Rice bucket finger flex/ext     Rice bucket wrist sup/pro     Flexbar wrist flex/ext  30x red   Flexbar wrist pro/sup  30x red   Dumbbell wrist pro/sup  30x 5lbs            QS     SLR     Standing calf stretch with wedge     Calf raises  30x

## 2012-05-09 ENCOUNTER — Encounter: Payer: Self-pay | Admitting: Primary Care

## 2012-05-13 ENCOUNTER — Other Ambulatory Visit: Payer: Self-pay | Admitting: Neurology

## 2012-05-13 ENCOUNTER — Ambulatory Visit: Payer: Self-pay | Admitting: Rehabilitative and Restorative Service Providers"

## 2012-05-13 DIAGNOSIS — M84376A Stress fracture, unspecified foot, initial encounter for fracture: Secondary | ICD-10-CM

## 2012-05-13 DIAGNOSIS — S5290XA Unspecified fracture of unspecified forearm, initial encounter for closed fracture: Secondary | ICD-10-CM

## 2012-05-13 NOTE — Progress Notes (Signed)
Sports PT Daily Note    HEBAH DUHART   [id number]   Encounter Diagnoses   Name Primary?   . Stress fracture of the metatarsals Yes   . Closed fracture of unspecified part of radius with ulna          Subjective:  Pain Score:  0  Pain: Unchanged  Reports L shoulder pain today that has been present for approx 3 weeks    Objective:  ROM - Unchanged, Bilateral, Wrist, AROM  Strength - Ther Ex per flowsheet  Function: - Unchanged, LOB 3x with calf raise   Education:  Verbal cues for ther ex    Objective     see flowsheet     Treatment Modalities:  Ther Exercise per flowsheet    Assessment:  Patient responding favorably    Plan of Care:  Continue per Plan of care -  As written    Thank you for referring this patient to [facility] Sp[facility]    [facility]        Manual stretching wrist flex, ext, sup, pronation  5 min        Gait training  5 min    Agility ladder side steps     Agility ladder     Hurdle step overs     Marching     Balance training with perturbations  5'       Rockerboard lateral weight shifts with airex     Leg Press  55 lbs 3x10   HS curl 10lbs 3x10   SAQ 10lbs 3x10       Rice bucket finger flex/ext     Rice bucket wrist sup/pro     Flexbar wrist flex/ext     Flexbar wrist pro/sup     Dumbbell wrist pro/sup  30x 5lbs        T-band    - B row Peach 3x10   - ER Peach 10x progressing to 3x10       QS     SLR     Standing calf stretch with wedge     Calf raises  30x

## 2012-05-14 ENCOUNTER — Other Ambulatory Visit: Payer: Self-pay | Admitting: Neurology

## 2012-05-14 ENCOUNTER — Ambulatory Visit
Admit: 2012-05-14 | Discharge: 2012-05-14 | Disposition: A | Discharge status: Home / Self Care | Payer: Self-pay | Source: Ambulatory Visit | Attending: General Practice | Admitting: General Practice

## 2012-05-15 LAB — AEROBIC CULTURE: Aerobic Culture: 0

## 2012-05-15 NOTE — Telephone Encounter (Signed)
Request by fax came in again today.

## 2012-05-16 ENCOUNTER — Encounter: Payer: Self-pay | Admitting: Neurology

## 2012-05-16 NOTE — Telephone Encounter (Signed)
A user error has taken place: encounter opened in error, closed for administrative reasons.

## 2012-05-16 NOTE — Telephone Encounter (Signed)
Bonita Quin calling from [facility] to inquire about status of refill-patient out of meds.

## 2012-05-16 NOTE — Telephone Encounter (Signed)
Linda from pharmacy calling to inquire about refill status;patient out of meds.

## 2012-05-20 ENCOUNTER — Ambulatory Visit: Payer: Self-pay | Admitting: Rehabilitative and Restorative Service Providers"

## 2012-05-20 ENCOUNTER — Other Ambulatory Visit: Payer: Self-pay | Admitting: Neurology

## 2012-05-20 MED ORDER — CARBAMAZEPINE 100 MG PO TB12 *I*
ORAL_TABLET | ORAL | Status: DC
Start: 2012-05-20 — End: 2012-07-19

## 2012-05-20 MED ORDER — TEGRETOL XR 200 MG PO TB12
ORAL_TABLET | ORAL | Status: DC
Start: 2012-05-20 — End: 2012-05-22

## 2012-05-20 NOTE — Telephone Encounter (Signed)
[name] from [facility] calling about refill on tegretol 200mg . This does need to be refilled although the request came through several times and some were refused as duplicates. They will put through request for 200 mg and 100 mg scripts. They did fill the 100 mg on 6.26 but it has no refills. They will put this new one on file and fill the 200mg  tegretol

## 2012-05-20 NOTE — Telephone Encounter (Signed)
Patient calling about this script. Still not available for her at pharmacy. Will not have enough to get through the Holiday. She wants a call back once the order has been processed.

## 2012-05-20 NOTE — Telephone Encounter (Signed)
Patient calling, anxious because pharmacy has been requesting script since 6/26 with no response.  Please fill

## 2012-05-21 ENCOUNTER — Ambulatory Visit: Payer: Self-pay | Admitting: Rehabilitative and Restorative Service Providers"

## 2012-05-22 ENCOUNTER — Other Ambulatory Visit: Payer: Self-pay | Admitting: Neurology

## 2012-05-24 NOTE — Telephone Encounter (Signed)
Fax received with hand written request: "Please send brand name, you sent generic previously. Contact [facility] @ [phone]

## 2012-05-25 MED ORDER — TEGRETOL XR 200 MG PO TB12
ORAL_TABLET | ORAL | Status: DC
Start: 2012-05-22 — End: 2012-07-19

## 2012-05-28 ENCOUNTER — Ambulatory Visit: Payer: Self-pay | Admitting: Rehabilitative and Restorative Service Providers"

## 2012-05-28 DIAGNOSIS — S5290XA Unspecified fracture of unspecified forearm, initial encounter for closed fracture: Secondary | ICD-10-CM

## 2012-05-28 DIAGNOSIS — M84376A Stress fracture, unspecified foot, initial encounter for fracture: Secondary | ICD-10-CM

## 2012-05-28 NOTE — Progress Notes (Signed)
Sports PT Daily Note    [name]   [id number]   Encounter Diagnoses   Name Primary?   . Stress fracture of the metatarsals Yes   . Closed fracture of unspecified part of radius with ulna          Subjective:  Pain Score:  0  Pain: Unchanged  Reports continued L shoulder pain today    Objective:  ROM - Unchanged, Bilateral, Wrist, AROM  Strength - Ther Ex per flowsheet  Function: - Unchanged, LOB 1x with calf raise  Education:  Verbal cues for ther ex    Objective     see flowsheet     Treatment Modalities:  Ther Exercise per flowsheet    Assessment:  Patient responding favorably    Plan of Care:  Continue per Plan of care -  As written    Thank you for referring this patient to [facility] Sports and Spine Rehabilitation    [facility]        Manual stretching wrist flex, ext, sup, pronation  5 min        Gait training  5 min    Agility ladder side steps     Agility ladder     Hurdle step overs     Marching     Balance training with perturbations  5'       Rockerboard lateral weight shifts with airex     Leg Press  55 lbs 3x10   HS curl 10lbs 3x10   SAQ 10lbs 3x10       Rice bucket finger flex/ext     Rice bucket wrist sup/pro     Flexbar wrist flex/ext     Flexbar wrist pro/sup     Dumbbell wrist pro/sup  30x 5lbs        T-band    - B row Peach 3x10   - ER Peach 10x progressing to 3x10       QS     SLR     Standing calf stretch with wedge     Calf raises  30x

## 2012-06-08 ENCOUNTER — Other Ambulatory Visit: Payer: Self-pay | Admitting: Primary Care

## 2012-06-10 ENCOUNTER — Other Ambulatory Visit: Payer: Self-pay | Admitting: Primary Care

## 2012-06-10 DIAGNOSIS — J309 Allergic rhinitis, unspecified: Secondary | ICD-10-CM

## 2012-06-10 MED ORDER — FLUTICASONE PROPIONATE 50 MCG/ACT NA SUSP *I*
2.0000 | Freq: Every day | NASAL | Status: DC
Start: 2012-06-10 — End: 2013-06-26

## 2012-06-10 MED ORDER — AZELASTINE HCL 137 MCG/SPRAY NA SOLN *I*
NASAL | Status: DC
Start: 2012-06-10 — End: 2012-07-19

## 2012-06-11 ENCOUNTER — Ambulatory Visit: Payer: Self-pay | Admitting: Rehabilitative and Restorative Service Providers"

## 2012-06-18 ENCOUNTER — Ambulatory Visit: Payer: Self-pay | Admitting: Rehabilitative and Restorative Service Providers"

## 2012-06-18 ENCOUNTER — Ambulatory Visit: Payer: Self-pay | Admitting: Primary Care

## 2012-06-20 ENCOUNTER — Ambulatory Visit: Payer: Self-pay | Admitting: Rehabilitative and Restorative Service Providers"

## 2012-06-20 DIAGNOSIS — M84376A Stress fracture, unspecified foot, initial encounter for fracture: Secondary | ICD-10-CM

## 2012-06-20 DIAGNOSIS — S5290XA Unspecified fracture of unspecified forearm, initial encounter for closed fracture: Secondary | ICD-10-CM

## 2012-06-20 NOTE — Progress Notes (Signed)
Sports PT Daily Note    [name]   [id number]   Encounter Diagnoses   Name Primary?   . Stress fracture of the metatarsals Yes   . Closed fracture of unspecified part of radius with ulna          Subjective:  Pain Score:  0  Pain: Unchanged, no complaints of pain today, mainly stiffness in B wrists    Objective:  ROM - Unchanged, Bilateral, Wrist, AROM  Strength - Ther Ex per flowsheet  Function: - Unchanged, no LOB with gait training on this date  Education:  Verbal cues for ther ex    Objective     see flowsheet     Treatment Modalities:  Ther Exercise per flowsheet    Assessment:  Patient responding favorably    Plan of Care:  Continue per Plan of care -  As written    Thank you for referring this patient to [facility] Sp[facility]    [facility]        Manual stretching wrist flex, ext, sup, pronation  5 min        Gait training  5 min    Agility ladder side steps     Agility ladder     Hurdle step overs     Marching     Balance training with perturbations  5'       Rockerboard lateral weight shifts with airex     Leg Press  50 lbs 3x10   HS curl 10lbs 3x10   SAQ 10lbs 3x10       Rice bucket finger flex/ext     Rice bucket wrist sup/pro     Flexbar wrist flex/ext     Flexbar wrist pro/sup     Dumbbell wrist pro/sup  30x 5lbs        T-band    - B row    - ER        QS     SLR     Standing calf stretch with wedge     Calf raises  30x

## 2012-07-02 ENCOUNTER — Ambulatory Visit: Payer: Self-pay | Admitting: Rehabilitative and Restorative Service Providers"

## 2012-07-09 ENCOUNTER — Ambulatory Visit: Payer: Self-pay | Admitting: Rehabilitative and Restorative Service Providers"

## 2012-07-09 ENCOUNTER — Telehealth: Payer: Self-pay | Admitting: Primary Care

## 2012-07-09 DIAGNOSIS — Z5181 Encounter for therapeutic drug level monitoring: Secondary | ICD-10-CM

## 2012-07-09 DIAGNOSIS — I10 Essential (primary) hypertension: Secondary | ICD-10-CM

## 2012-07-09 DIAGNOSIS — E559 Vitamin D deficiency, unspecified: Secondary | ICD-10-CM

## 2012-07-09 DIAGNOSIS — M84376A Stress fracture, unspecified foot, initial encounter for fracture: Secondary | ICD-10-CM

## 2012-07-09 DIAGNOSIS — S5290XA Unspecified fracture of unspecified forearm, initial encounter for closed fracture: Secondary | ICD-10-CM

## 2012-07-09 NOTE — Progress Notes (Signed)
SPORTS REHABILITATION PT LE STATUS      Subjective    [name] is a 56 y.o. female who is present today for continued treatment of her B UE/LE weakness and balance deficits.  Mechanism of injury/history of symptoms: No specific cause  Has had limited attendance over the last month secondary to visiting son away at school.  Overall, continued reports of gait disturbances.  Also, new orthotics.     Attendance:  Good    Compliance:  Good   Relevant symptoms: Pain , Decreased ROM, Decreased strength, Instability, balance deficits    Symptom frequency: Persistent  Symptom intensity (0 - 10 scale): Now 0 Best 0 Worst 3  Complains of L > R wrist tightness/soreness with ADL's    Patient's goals for therapy: Reduce pain, Increase ROM, Increase strength, Independent with home program, improve balance     Objective:    Observation: WNL   Palpation:  WNL     ROM/Strength:    bilateral knee, bilateral ankle          LE LEFT   RIGHT   Strength    PROM AROM PROM AROM Left Right   Knee ext     4/5 4/5   Knee flex     4/5 4/5   Ankle DF     4/5 4/5   Ankle PF     3+/5 3+/5            Wrist flex     4/5 4/5   Wrist ext     4/5 4/5   Wrist sup/pro     4/5 4/5     Continued inability to maintain SLS for greater than 5 sec on L or R LE's      Special Tests:   Hip: N/A     Knee: N/A     Ankle: N/A     Functional:  Perform self-care activities/basic ADLs - able to perform.  Walk more than 10 minutes - unable to perform.  Ascend stairs with reciprocal gait - unable to perform.  Descend stairs with reciprocal gait - unable to perform.    Assessment  Findings consistent with 56 y.o., female with   Encounter Diagnoses   Name Primary?   . Stress fracture of the metatarsals Yes   . Closed fracture of unspecified part of radius with ulna      with ROM limitations, strength limitations, functional limitations, balance deficits    Prognosis:  Fair    Contraindications/Precautions/Limitation:  Per diagnosis  Long Term Goals:  Pain/Sx 0 -  minimal, ROM/ flexibility WNL , Restoration of functional strength, Functional return to ADLs / activites without limitation        Treatment Plan:   Patient/family involved in developing goals and treatment plan: yes  Freq 1 times per week(s) for 3 month(s)     Exercise: AROM, AAROM, PROM, Progressive Resistive   Manual Techniques:  N/A   Modalities:  NA    Functional: Proprioception/Dynamic stability, Functional rehab       Thank you for referring this patient to [facility].    Shyenne Maggard      Manual stretching wrist flex, ext, sup, pronation  5 min        Gait training  5 min    Agility ladder side steps     Agility ladder     Hurdle step overs     Marching     Balance training with perturbations  5'        Rockerboard lateral weight shifts with airex  30x   Leg Press  50 lbs 3x10    HS curl  10lbs 3x10    SAQ  10lbs 3x10        Rice bucket finger flex/ext     Rice bucket wrist sup/pro     Flexbar wrist flex/ext  30x red    Flexbar wrist pro/sup  30x red    Dumbbell wrist pro/sup  30x 5lbs            QS     SLR     Standing calf stretch with wedge     Calf raises  30x

## 2012-07-09 NOTE — Telephone Encounter (Signed)
Spoke to pt she is aware.

## 2012-07-09 NOTE — Telephone Encounter (Signed)
Pt has fu with you on Fri 07/19/12, wondering if you'd like her to have labs done before then? Let me know! Thank you!!

## 2012-07-09 NOTE — Telephone Encounter (Signed)
pls let pt know that labs are in system(need to be fasting)

## 2012-07-12 ENCOUNTER — Ambulatory Visit: Payer: Self-pay | Admitting: Primary Care

## 2012-07-12 ENCOUNTER — Encounter: Payer: Self-pay | Admitting: Gastroenterology

## 2012-07-12 LAB — HM COLONOSCOPY

## 2012-07-15 ENCOUNTER — Ambulatory Visit
Admit: 2012-07-15 | Discharge: 2012-07-15 | Disposition: A | Discharge status: Home / Self Care | Payer: Self-pay | Source: Ambulatory Visit | Attending: Primary Care | Admitting: Primary Care

## 2012-07-15 DIAGNOSIS — Z5181 Encounter for therapeutic drug level monitoring: Secondary | ICD-10-CM

## 2012-07-15 DIAGNOSIS — E559 Vitamin D deficiency, unspecified: Secondary | ICD-10-CM

## 2012-07-15 DIAGNOSIS — R569 Unspecified convulsions: Secondary | ICD-10-CM

## 2012-07-15 DIAGNOSIS — I10 Essential (primary) hypertension: Secondary | ICD-10-CM

## 2012-07-15 LAB — LIPID PANEL
Chol/HDL Ratio: 2.5
Cholesterol: 252 mg/dL — AB
HDL: 101 mg/dL
LDL Calculated: 134 mg/dL
Non HDL Cholesterol: 151 mg/dL
Triglycerides: 86 mg/dL

## 2012-07-15 LAB — COMPREHENSIVE METABOLIC PANEL
ALT: 29 U/L (ref 0–35)
AST: 20 U/L (ref 0–35)
Albumin: 4.6 g/dL (ref 3.5–5.2)
Alk Phos: 84 U/L (ref 35–105)
Anion Gap: 12 (ref 7–16)
Bilirubin,Total: 0.3 mg/dL (ref 0.0–1.2)
CO2: 28 mmol/L (ref 20–28)
Calcium: 9.4 mg/dL (ref 8.6–10.2)
Chloride: 100 mmol/L (ref 96–108)
Creatinine: 0.64 mg/dL (ref 0.51–0.95)
GFR,Black: 113 *
GFR,Caucasian: 98 *
Glucose: 90 mg/dL (ref 60–99)
Lab: 20 mg/dL (ref 6–20)
Potassium: 4.4 mmol/L (ref 3.3–5.1)
Sodium: 140 mmol/L (ref 133–145)
Total Protein: 7.2 g/dL (ref 6.3–7.7)

## 2012-07-15 LAB — TSH: TSH: 1.06 u[IU]/mL (ref 0.27–4.20)

## 2012-07-15 LAB — MULTIPLE ORDERING DOCS

## 2012-07-15 LAB — CBC
Hematocrit: 41 % (ref 34–45)
Hemoglobin: 12.8 g/dL (ref 11.2–15.7)
MCV: 98 fL — ABNORMAL HIGH (ref 79–95)
Platelets: 268 10*3/uL (ref 160–370)
RBC: 4.1 MIL/uL (ref 3.9–5.2)
RDW: 14 % (ref 11.7–14.4)
WBC: 5.3 10*3/uL (ref 4.0–10.0)

## 2012-07-16 ENCOUNTER — Ambulatory Visit: Payer: Self-pay | Admitting: Rehabilitative and Restorative Service Providers"

## 2012-07-16 ENCOUNTER — Encounter: Payer: Self-pay | Admitting: Neurology

## 2012-07-16 ENCOUNTER — Ambulatory Visit: Payer: Self-pay | Admitting: Neurology

## 2012-07-16 VITALS — BP 133/64 | HR 80 | Ht 63.0 in | Wt 124.9 lb

## 2012-07-16 DIAGNOSIS — S5290XA Unspecified fracture of unspecified forearm, initial encounter for closed fracture: Secondary | ICD-10-CM

## 2012-07-16 DIAGNOSIS — R569 Unspecified convulsions: Secondary | ICD-10-CM

## 2012-07-16 DIAGNOSIS — M84376A Stress fracture, unspecified foot, initial encounter for fracture: Secondary | ICD-10-CM

## 2012-07-16 NOTE — Progress Notes (Signed)
Sports PT Daily Note    [name]   [id number]   Encounter Diagnoses   Name Primary?   . Stress fracture of the metatarsals Yes   . Closed fracture of unspecified part of radius with ulna          Subjective:  Pain Score:  0  Pain: Unchanged, stiffness in B wrists secondary to repetitive use    Objective:  ROM - Unchanged, Bilateral, Wrist, AROM  Strength - Ther Ex per flowsheet  Function: - Unchanged, VC for gait training   Education:  Verbal cues for ther ex    Objective     see flowsheet     Treatment Modalities:  Ther Exercise per flowsheet    Assessment:  Patient responding favorably    Plan of Care:  Continue per Plan of care -  As written    Thank you for referring this patient to [facility] Sports and Spine Rehabilitation    [facility]        Manual stretching wrist flex, ext, sup, pronation  5 min        Gait training  5 min    Agility ladder side steps     Agility ladder     Hurdle step overs     Marching     Balance training with perturbations  5'       Rockerboard lateral weight shifts with airex     Leg Press  50 lbs 3x10   HS curl 10lbs 3x10   SAQ 10lbs 3x10       Rice bucket finger flex/ext     Rice bucket wrist sup/pro     Flexbar wrist flex/ext     Flexbar wrist pro/sup     Dumbbell wrist pro/sup  30x 5lbs        T-band    - B row    - ER        QS     SLR     Standing calf stretch with wedge     Calf raises  30x

## 2012-07-16 NOTE — Patient Instructions (Signed)
Overall doing well.    YOur LDL cholesterol remains elevated. WOuld consider a statin agent.     You can discuss with Dr. Elmer Bales.    Continue current dose of Tegretol.  WIth new medications, would repeat levels in 2-3 weeks.    Follow up in 6 months.

## 2012-07-17 LAB — CARBAMAZEPINE, FREE & TOTAL
Carbamazepine, Free: 22.4 % (ref 8.0–35.0)
Carbamazepine,Free: 2.2 ug/mL (ref 1.0–3.0)
Carbamazepine,Total: 9.8 ug/mL (ref 4.0–12.0)

## 2012-07-17 LAB — VITAMIN D
25-OH VIT D2: <4 ng/mL
25-OH VIT D3: 38 ng/mL
25-OH Vit Total: 38 ng/mL (ref 30–80)

## 2012-07-19 ENCOUNTER — Ambulatory Visit: Payer: Self-pay | Admitting: Primary Care

## 2012-07-19 ENCOUNTER — Encounter: Payer: Self-pay | Admitting: Primary Care

## 2012-07-19 VITALS — BP 122/74 | HR 76 | Ht 64.5 in | Wt 124.4 lb

## 2012-07-19 DIAGNOSIS — K219 Gastro-esophageal reflux disease without esophagitis: Secondary | ICD-10-CM

## 2012-07-19 DIAGNOSIS — E559 Vitamin D deficiency, unspecified: Secondary | ICD-10-CM

## 2012-07-19 DIAGNOSIS — E039 Hypothyroidism, unspecified: Secondary | ICD-10-CM

## 2012-07-19 DIAGNOSIS — E785 Hyperlipidemia, unspecified: Secondary | ICD-10-CM

## 2012-07-19 NOTE — Progress Notes (Signed)
Reason for Visit: Results, Follow-up, Hypertension, Hypothyroidism and Gastrophageal Reflux    Had a good visit with her son    Hyperlipidemia--Dr. Soyla Dryer had suggested medication.  Her TG 86, HDL 101, LDL 134    GERD--relatively well controlled on the aciphex.  She doesn't like to take the med, but has to to remain controlled, must stay on the antireflux diet     Hypothyroidism--tolerating levothyroxin.  Denies undue fatigue.  Denies unexplained changes in weight, bowel habits or energy level.    Screening--colonoscopy completed last Friday, went fine.  Very mild diverticulosis.    Osteoporosis--taking prolia.  Dr. Hassell Halim has left.  Patient is taking calcium and vitamin or if he is not getting weightbearing exercises she would like.  Doing the PT once weekly.  She is doing cybex once per week.  She is planning to work with a Psychologist, educational at the [facility] so she can increase   Patient Active Problem List   Diagnosis Code   . Reported Trauma Neck 9990   . Allergic Rhinitis 477.9   . Irritable Bowel Syndrome 564.1   . Hypertension 401.9   . Dysplastic Nevus 448.1   . Rosacea 695.3   . Osteoporosis 733.00   . Hypothyroidism 244.9   . Scoliosis 737.30   . Urge Incontinence Of Urine 788.31   . 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 Convulsive Disorder 780.39   . 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 Ringing In The Ears (Tinnitus) 388.30   . GERD (gastroesophageal reflux disease) 530.81     Current Outpatient Prescriptions   Medication   . carBAMazepine (TEGRETOL) 200 MG tablet   . GuaiFENesin (MUCINEX PO)   . Denosumab (PROLIA SC)   . loratadine (CLARITIN) 10 MG tablet   . Multiple Vitamin (MULTIVITAMIN) per tablet   . metronidazole (NORITATE) 1 % cream   . cholecalciferol (VITAMIN D) 1000 UNITS capsule   . Calcium Carb-Cholecalciferol 600-200 MG-UNIT TABS   . fluticasone-salmeterol (ADVAIR DISKUS) 250-50 MCG/DOSE diskus inhaler   . carBAMazepine (TEGRETOL XR) 100 MG 12 hr tablet   . azelastine  (ASTELIN) 137 MCG/SPRAY nasal spray   . fluticasone (FLONASE) 50 MCG/ACT nasal spray   . levothyroxine (SYNTHROID, LEVOTHROID) 25 MCG tablet   . Non-System Medication   . ENABLEX 15 MG 24 hr tablet   . amLODIPine (NORVASC) 5 MG tablet   . nystatin (NYSTOP) 100000 UNIT/GM powder   . rabeprazole (ACIPHEX) 20 MG tablet   . NON-SYSTEM MEDICATION *A*     No current facility-administered medications for this visit.       Medication list reviewed and updated, no changes were made today    Exam: BP 116/68  Pulse 76  Ht 1.638 m (5' 4.5")  Wt 56.427 kg (124 lb 6.4 oz)  BMI 21.03 kg/m2  Well-nourished alert and conversant 56 year old lady in no acute distress.  Lungs are clear to auscultation bilaterally.  No wheezes rales or rhonchi.  Carotids without bruits.  Cardiac exam reveals a normal S1, S2 with a regular rate and rhythm no rubs murmurs or gallops.  Abdomen is soft nontender.  Extremities are without edema.  Per patient request, breast exam was completed, no mass, no nipple inversion, no axillary adenopathy.  Of note, patient does have symmetric fibrocystic changes of the breasts  A/P:  1.  HTN--meets BP goals, continue amlodipine  2.  Hyperlipidemia--triglyceride its goal under 150, HDL mutable over 50, LDL is close to the liberal goal of under  1:30, has dropped approximately 20 points since last visit.  We discussed the fact that Dr. Soyla Dryer would really like to see her LDL under 100 to reduce risk of ischemic stroke in the future.  Patient is hesitant to start any new medications due to potential side effects.  She does agree to work on dietary modification and exercise and we're going to repeat her labs along with a high-sensitivity CRP in approximately 3 months.  We will readdress consideration of a statin at that time  3.  Hypothyroidism-TSH within normal limits on current regimen, continue levothyroxin.  4.  Osteoporosis-Dr. Is retiring, will need to consider vest to help take care of her osteoporosis  thereafter.  Patient is going to continue therapy with her we have until her next bone density scan in approximately 6 months.  Continue calcium and vitamin D, increase weightbearing exercise as tolerated    [facility] Hypertension Plan    Based on this patient's clinical history and according to [facility] guidelines target BP goal is: less than 140/90  Based on the patient's last BP of BP: 122/74 mmHg the patient is: at goal  The plan to reach goal   1.  Reviewed pt's understanding of medications including any barriers to adherence.   2.  Recommended lifestyle modifications: exercise plan and medication compliance   3.  The patient understands their clinical goals and will undertake self-management recommendations including:all     4.  Medication Management: no changes made    5.  Referral to Care Management:: No   6.  Patient Ed/Self-management tools provided: Yes

## 2012-07-22 ENCOUNTER — Other Ambulatory Visit: Payer: Self-pay | Admitting: Neurology

## 2012-07-24 ENCOUNTER — Other Ambulatory Visit: Payer: Self-pay | Admitting: Primary Care

## 2012-07-24 DIAGNOSIS — E039 Hypothyroidism, unspecified: Secondary | ICD-10-CM

## 2012-07-26 NOTE — Progress Notes (Signed)
I had the pleasure seeing her today in followup of her seizure disorder.  She's had no seizures since our last visit.  She continues to have relatively good energy.  At times she feels that her attention is poor but overall has been doing reasonably well.  She continues to receive physical therapy for her balance and overall strength.  She is working some hours each week.  She spends a lot of her time taking care of her mother who [facility].    A 16 point review of systems is notable for mild tinnitus, occasional heartburn, urinary leakage and low-grade musculoskeletal neck pain.  Her most recent LDL was 130.    Examination:  Filed Vitals:    07/16/12 0941   BP: 133/64   Pulse: 80   Height: 1.6 m (5\' 3" )   Weight: 56.654 kg (124 lb 14.4 oz)     Mental status: She is alert and oriented.  Her speech and comprehension are normal.  Her affect is appropriate.  Cranial nerve examination reveals full versions without nystagmus.  Fields are full.  She has good strength throughout.  She is a very mild suspension tremor.  She also has a very subtle tremor of the head although this is less prominent than last visit.  Her gait remains wide-based but unchanged.    Impression: History of intracerebral AVM and residual seizure disorder.  I recommended that she continue on her current dose of Tegretol.  She has no signs of toxicity.  Her LDL is quite elevated and she'll be speaking to you about possibly starting a statin medication.  I recommended that we monitor her Tegretol carefully with any new medication.  I plan seeing her again in 6 months.

## 2012-07-29 ENCOUNTER — Ambulatory Visit: Payer: Self-pay | Admitting: Rehabilitative and Restorative Service Providers"

## 2012-07-29 DIAGNOSIS — S5290XA Unspecified fracture of unspecified forearm, initial encounter for closed fracture: Secondary | ICD-10-CM

## 2012-07-29 DIAGNOSIS — M84376A Stress fracture, unspecified foot, initial encounter for fracture: Secondary | ICD-10-CM

## 2012-07-29 NOTE — Patient Instructions (Addendum)
Loney Hering      ,      [id number] July 29, 2012         Patient: [name]   MR Number: [id number]   Date of Birth: [birth year]   Date of Visit: 07/29/2012     This is to certify that [name] was seen in our office on the above date for for the management of upper and lower extremities.    May participate in a gym program at the [facility]     With no restrictions      Sincerely,    Electronically signed by Loney Hering 07/29/2012 10:17 AM

## 2012-07-29 NOTE — Progress Notes (Signed)
Sports PT Daily Note    [name]   [id number]   Encounter Diagnoses   Name Primary?   . Stress fracture of the metatarsals Yes   . Closed fracture of unspecified part of radius with ulna          Subjective:  Pain Score:  0  Pain: Unchanged  Has join the gym and will be working with a Systems analyst    Objective:  ROM - Unchanged, Bilateral, Wrist, AROM  Strength - Ther Ex per flowsheet  Function: - Unchanged, VC for gait training   Education:  Verbal cues for ther ex    Objective     see flowsheet     Treatment Modalities:  Ther Exercise per flowsheet    Assessment:  Patient responding favorably    Plan of Care:  Continue per Plan of care -  As written    Thank you for referring this patient to [facility] and Spine Rehabilitation    [facility]        Manual stretching wrist flex, ext, sup, pronation  5 min        Gait training  5 min    Agility ladder side steps     Agility ladder     Hurdle step overs     Marching     Balance training with perturbations  5'       Rockerboard lateral weight shifts with airex     Leg Press  4p 3x10   HS curl    SAQ 1.5p 3x10       Rice bucket finger flex/ext     Rice bucket wrist sup/pro     Flexbar wrist flex/ext     Flexbar wrist pro/sup     Dumbbell wrist pro/sup  30x 5lbs        T-band    - B row    - ER        QS     SLR     Standing calf stretch with wedge     Calf raises  30x

## 2012-08-06 ENCOUNTER — Ambulatory Visit: Payer: Self-pay | Admitting: Rehabilitative and Restorative Service Providers"

## 2012-08-12 ENCOUNTER — Ambulatory Visit: Payer: Self-pay | Admitting: Rehabilitative and Restorative Service Providers"

## 2012-08-12 DIAGNOSIS — M84376A Stress fracture, unspecified foot, initial encounter for fracture: Secondary | ICD-10-CM

## 2012-08-12 DIAGNOSIS — S5290XA Unspecified fracture of unspecified forearm, initial encounter for closed fracture: Secondary | ICD-10-CM

## 2012-08-12 NOTE — Progress Notes (Signed)
Sports PT Daily Note    LOUEEN ELM   [id number]   Encounter Diagnoses   Name Primary?   . Stress fracture of the metatarsals Yes   . Closed fracture of unspecified part of radius with ulna          Subjective:  Pain Score:  0  Pain: Unchanged  Currently working with a Systems analyst at home 1-2x/wk    Objective:  ROM - Unchanged, Bilateral, Wrist, AROM  Strength - Ther Ex per flowsheet  Function: - Unchanged, VC for gait training   Education:  Verbal cues for ther ex    Objective     see flowsheet     Treatment Modalities:  Ther Exercise per flowsheet    Assessment:  Patient responding favorably    Plan of Care:  Continue per Plan of care -  As written    Thank you for referring this patient to [facility] and Sp[facility]    [facility]        Manual stretching wrist flex, ext, sup, pronation  5 min        Gait training  5 min    Agility ladder side steps  5 min    Agility ladder     Hurdle step overs     Marching     Balance training with perturbations  5'       Rockerboard lateral weight shifts with airex     Leg Press  3.5 3x10   HS curl    LAQ 1.5p 3x10       Rice bucket finger flex/ext     Rice bucket wrist sup/pro     Flexbar wrist flex/ext     Flexbar wrist pro/sup     Dumbbell wrist pro/sup  30x 5lbs        T-band    - B row    - ER        QS     SLR     Standing calf stretch with wedge     Calf raises

## 2012-08-16 ENCOUNTER — Ambulatory Visit: Payer: Self-pay | Admitting: Primary Care

## 2012-08-16 ENCOUNTER — Telehealth: Payer: Self-pay | Admitting: Primary Care

## 2012-08-16 VITALS — BP 122/76 | HR 68 | Wt 124.8 lb

## 2012-08-16 DIAGNOSIS — J329 Chronic sinusitis, unspecified: Secondary | ICD-10-CM

## 2012-08-16 MED ORDER — DOXYCYCLINE HYCLATE 100 MG PO TABS *I*
ORAL_TABLET | ORAL | Status: DC
Start: 2012-08-16 — End: 2012-10-22

## 2012-08-16 NOTE — Telephone Encounter (Signed)
Pt called with 2 day hx of headache, cheek/eye/ear pain, no fever or no colored mucus.  She is leaving town on Tuesday.  She has tried Astelin, Claritin, Mucinex and flonase and is sure she has a sinus infection. Please advise ASAP

## 2012-08-16 NOTE — Telephone Encounter (Signed)
I'm happy to see her

## 2012-08-16 NOTE — Progress Notes (Signed)
Reason for Visit: Pain    Patient comes in today with a concern for a sinus infection.  She has chronic allergic rhinitis.  Is taking her antihistamine, using the nasal saline, nasal antihistamine and the nasal steroid.  Feels like a painless migraine, ears hurt, light bothers.  Nasal congestion is clear. Was having trouble concentrating. Sx began two days ago.    Patient has air travel coming up next week, and this concerns her with regard to her sinus pressure  Patient Active Problem List   Diagnosis Code   . Reported Trauma Neck    . Allergic Rhinitis 477.9   . Irritable Bowel Syndrome 564.1   . Hypertension 401.9   . Dysplastic Nevus 448.1   . Rosacea 695.3   . Osteoporosis 733.00   . Hypothyroidism 244.9   . Scoliosis 737.30   . Urge Incontinence Of Urine 788.31   . A-V Malformation Repair Supratentorial Complex    . Convulsive Disorder 780.39   . 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 Ringing In The Ears (Tinnitus) 388.30   . GERD (gastroesophageal reflux disease) 530.81     Current Outpatient Prescriptions   Medication   . TEGRETOL-XR 100 MG 12 hr tablet   . levothyroxine (SYNTHROID, LEVOTHROID) 25 MCG tablet   . carBAMazepine (TEGRETOL) 200 MG tablet   . GuaiFENesin (MUCINEX PO)   . Denosumab (PROLIA SC)   . loratadine (CLARITIN) 10 MG tablet   . Multiple Vitamin (MULTIVITAMIN) per tablet   . metronidazole (NORITATE) 1 % cream   . cholecalciferol (VITAMIN D) 1000 UNITS capsule   . Calcium Carb-Cholecalciferol 600-200 MG-UNIT TABS   . fluticasone-salmeterol (ADVAIR DISKUS) 250-50 MCG/DOSE diskus inhaler   . carBAMazepine (TEGRETOL XR) 100 MG 12 hr tablet   . azelastine (ASTELIN) 137 MCG/SPRAY nasal spray   . fluticasone (FLONASE) 50 MCG/ACT nasal spray   . Non-System Medication   . ENABLEX 15 MG 24 hr tablet   . amLODIPine (NORVASC) 5 MG tablet   . nystatin (NYSTOP) 100000 UNIT/GM powder   . rabeprazole (ACIPHEX) 20 MG tablet   . NON-SYSTEM MEDICATION *A*     No current facility-administered  medications for this visit.       Medication list reviewed and updated, no changes were made today    Exam: BP 122/76  Pulse 68  Wt 56.609 kg (124 lb 12.8 oz)  BMI 21.1 kg/m2  Well-nourished alert and conversant 56 year old lady in no acute distress.  Tympanic membranes reveal serous effusions bilaterally, minimal.  No erythema or loss of light reflex.  Nasal mucosa is erythematous with thick white drainage.  Maxillary sinuses are tender in left maxillary sinus transillumination is reduced.  Neck is supple without lymphadenopathy.  Lungs are clear to auscultation bilaterally.    A/P:  1.  Early sinusitis in a 56 year old lady with chronic allergic rhinitis and recurrent sinus infections-discussed risks and benefits of antibiotic therapy.  Typically, I would give her a few more days to see if this is viral and may resolve on its own.  However, given how sensitive she is to air travel in pressure differential in the ears, we did decide to treat her with doxycycline.  She is to continue her nasal saline rinses, nasal steroid, nasal antihistamine as well as the systemic antihistamine.  Given her history of stroke and hypertension, decongestants or avoided    Patient would like the Quadrivalent flu shot which is not available in our office.  She typically gets these in  split doses.  It was recommended that she get this done at a local pharmacy, she is willing to pay for the second dose if her insurance will not cover it

## 2012-08-19 ENCOUNTER — Encounter: Payer: Self-pay | Admitting: Primary Care

## 2012-08-19 ENCOUNTER — Ambulatory Visit: Payer: Self-pay | Admitting: Rehabilitative and Restorative Service Providers"

## 2012-09-03 ENCOUNTER — Ambulatory Visit: Payer: Self-pay | Admitting: Rehabilitative and Restorative Service Providers"

## 2012-09-03 ENCOUNTER — Encounter: Payer: Self-pay | Admitting: Gastroenterology

## 2012-09-03 DIAGNOSIS — S5290XA Unspecified fracture of unspecified forearm, initial encounter for closed fracture: Secondary | ICD-10-CM

## 2012-09-03 DIAGNOSIS — M84376A Stress fracture, unspecified foot, initial encounter for fracture: Secondary | ICD-10-CM

## 2012-09-03 NOTE — Progress Notes (Signed)
Sports PT Daily Note    [name]   [id number]   Encounter Diagnoses   Name Primary?   . Stress fracture of the metatarsals Yes   . Closed fracture of unspecified part of radius with ulna          Subjective:  Pain Score:  0  Pain: Unchanged  Reports falling in the shower 2 weeks ago with increased complaints of pain B wrist and L shoulder pain.  Overall, resolving complaints of soreness.    Objective:  ROM - Unchanged, Bilateral, Wrist, AROM  Strength - Ther Ex per flowsheet  Function: - Unchanged, VC for gait training   Education:  Verbal cues for ther ex    Objective     see flowsheet     B shoulder, elbow and wrist AROM is WNL with B shoulder soreness with Neer's impingement sign.    Treatment Modalities:  Ther Exercise per flowsheet    Assessment:  Patient responding favorably    Plan of Care:  Continue per Plan of care -  As written    Thank you for referring this patient to [facility] Spine Rehabilitation    [facility]        Manual stretching wrist flex, ext, sup, pronation  5 min        Gait training  5 min    Agility ladder side steps     Agility ladder    Hurdle step overs     Marching     Balance training with perturbations  5'       Rockerboard lateral weight shifts with airex     Leg Press  4p 3x10   HS curl    LAQ 1.5p 3x10       Rice bucket finger flex/ext     Rice bucket wrist sup/pro     Flexbar wrist flex/ext     Flexbar wrist pro/sup     Dumbbell wrist pro/sup  30x 5lbs        T-band    - B row    - ER Peach 3x10       QS     SLR     Standing calf stretch with wedge     Calf raises

## 2012-09-07 ENCOUNTER — Encounter: Payer: Self-pay | Admitting: Gastroenterology

## 2012-09-09 ENCOUNTER — Ambulatory Visit: Payer: Self-pay | Admitting: Rehabilitative and Restorative Service Providers"

## 2012-09-09 DIAGNOSIS — M84376A Stress fracture, unspecified foot, initial encounter for fracture: Secondary | ICD-10-CM

## 2012-09-09 DIAGNOSIS — S5290XA Unspecified fracture of unspecified forearm, initial encounter for closed fracture: Secondary | ICD-10-CM

## 2012-09-09 NOTE — Progress Notes (Signed)
SPORTS REHABILITATION PT LE STATUS      Subjective    [name] is a 56 y.o. female who is present today for continued treatment of her B UE/LE weakness and balance deficits.  Mechanism of injury/history of symptoms: No specific cause  Continued reports of gait disturbances and difficulty with orthotic fitting.  Also increased B UE pain secondary to falling in shower 3 weeks ago     Attendance:  Good    Compliance:  Good   Relevant symptoms: Pain , Decreased ROM, Decreased strength, Instability, balance deficits    Symptom frequency: Persistent  Symptom intensity (0 - 10 scale): Now 0 Best 0 Worst 3  Complains of L > R wrist tightness/soreness with ADL's    Patient's goals for therapy: Reduce pain, Increase ROM, Increase strength, Independent with home program, improve balance     Objective:    Observation: WNL   Palpation:  WNL     ROM/Strength:    bilateral knee, bilateral ankle          LE LEFT   RIGHT   Strength    PROM AROM PROM AROM Left Right   Knee ext     4/5 4/5   Knee flex     4/5 4/5   Ankle DF     4/5 4/5   Ankle PF     3+/5 3+/5            Wrist flex     4/5 4/5   Wrist ext     4/5 4/5   Wrist sup/pro     4/5 4/5     Continued inability to maintain SLS for greater than 5 sec on L or R LE's      Special Tests:   Hip: N/A     Knee: N/A     Ankle: N/A     Functional:  Perform self-care activities/basic ADLs - able to perform.  Walk more than 10 minutes - unable to perform.  Ascend stairs with reciprocal gait - unable to perform.  Descend stairs with reciprocal gait - unable to perform.    Assessment  Findings consistent with 56 y.o., female with   Encounter Diagnoses   Name Primary?   . Stress fracture of the metatarsals Yes   . Closed fracture of unspecified part of radius with ulna      with ROM limitations, strength limitations, functional limitations, balance deficits    Prognosis:  Fair    Contraindications/Precautions/Limitation:  Per diagnosis  Long Term Goals:  Pain/Sx 0 - minimal, ROM/  flexibility WNL , Restoration of functional strength, Functional return to ADLs / activites without limitation        Treatment Plan:   Patient/family involved in developing goals and treatment plan: yes  Freq 1 times per week(s) for 3 month(s)     Exercise: AROM, AAROM, PROM, Progressive Resistive   Manual Techniques:  N/A   Modalities:  NA    Functional: Proprioception/Dynamic stability, Functional rehab       Thank you for referring this patient to [employer] and Spine Rehabilitation.    [name]        Manual stretching wrist flex, ext, sup, pronation  5 min        Gait training  5 min    Agility ladder side steps     Agility ladder    Hurdle step overs     Marching     Balance training with perturbations  5'       Rockerboard lateral weight shifts with airex     Leg Press  4p 3x10   HS curl    LAQ 1.5p 3x10       Rice bucket finger flex/ext     Rice bucket wrist sup/pro     Flexbar wrist flex/ext     Flexbar wrist pro/sup     Dumbbell wrist pro/sup  30x 5lbs        T-band    - B row    - ER Peach 3x10       QS     SLR     Standing calf stretch with wedge     Calf raises

## 2012-09-16 ENCOUNTER — Ambulatory Visit: Payer: Self-pay | Admitting: Rehabilitative and Restorative Service Providers"

## 2012-09-16 DIAGNOSIS — S5290XA Unspecified fracture of unspecified forearm, initial encounter for closed fracture: Secondary | ICD-10-CM

## 2012-09-16 DIAGNOSIS — M84376A Stress fracture, unspecified foot, initial encounter for fracture: Secondary | ICD-10-CM

## 2012-09-16 NOTE — Progress Notes (Signed)
Sports PT Daily Note    [name]   [id number]   Encounter Diagnoses   Name Primary?   . Stress fracture of the metatarsals Yes   . Closed fracture of unspecified part of radius with ulna          Subjective:  Pain Score:  0  Pain: Improved, reports decreased B wrist soreness    Objective:  ROM - Unchanged  Strength - Ther Ex per flowsheet  Function: - Unchanged  Education:  Verbal cues for ther ex    Objective     see flow sheet    Treatment Modalities:  Ther Exercise per flowsheet    Assessment:  Patient responding favorably    Plan of Care:  Continue per Plan of care -  As written    Thank you for referring this patient to [facility] Sports and Spine Rehabilitation    [facility]          Manual stretching wrist flex, ext, sup, pronation  5 min        Gait training  5 min    Agility ladder side steps     Agility ladder    Hurdle step overs     Marching     Balance training with perturbations  5'       Rockerboard lateral weight shifts with airex     Leg Press  4p 3x10   HS curl    LAQ 1.5p 3x10       Rice bucket finger flex/ext     Rice bucket wrist sup/pro     Flexbar wrist flex/ext     Flexbar wrist pro/sup     Dumbbell wrist pro/sup  30x 5lbs        T-band    - B row    - ER Peach 3x10       QS     SLR     Standing calf stretch with wedge     Calf raises

## 2012-09-17 ENCOUNTER — Encounter: Payer: Self-pay | Admitting: Gastroenterology

## 2012-09-23 ENCOUNTER — Ambulatory Visit: Payer: Self-pay | Admitting: Rehabilitative and Restorative Service Providers"

## 2012-09-23 DIAGNOSIS — M84376A Stress fracture, unspecified foot, initial encounter for fracture: Secondary | ICD-10-CM

## 2012-09-23 DIAGNOSIS — S5290XA Unspecified fracture of unspecified forearm, initial encounter for closed fracture: Secondary | ICD-10-CM

## 2012-09-23 NOTE — Progress Notes (Signed)
Sports PT Daily Note    [name]   [id number]   Encounter Diagnoses   Name Primary?   . Stress fracture of the metatarsals Yes   . Closed fracture of unspecified part of radius with ulna          Subjective:  Pain Score:  0  Pain: Worsened, reports continued B wrist soreness, and B foot pain secondary to new orthotic casting    Objective:  ROM - Unchanged  Strength - Ther Ex per flowsheet  Function: - Unchanged  Education:  Verbal cues for ther ex    Objective     see flow sheet    Treatment Modalities:  Electrical Stimulation, unattended, Type  Interferential , Ther Exercise per flowsheet  Estim to B feet    Assessment:  Patient responding favorably    Plan of Care:  Continue per Plan of care -  As written    Thank you for referring this patient to [facility] Sports and Spine Rehabilitation    [employer]          Manual stretching wrist flex, ext, sup, pronation  5 min        Gait training     Agility ladder side steps     Agility ladder     Hurdle step overs     Marching     Balance training with perturbations         Rockerboard lateral weight shifts with airex     Leg Press     HS curl    LAQ        Rice bucket finger flex/ext     Rice bucket wrist sup/pro     Flexbar wrist flex/ext     Flexbar wrist pro/sup     Dumbbell wrist pro/sup  30x 5lbs        T-band    - B row    - ER Peach 3x10       QS     SLR     Standing calf stretch with wedge     Calf raises

## 2012-09-26 ENCOUNTER — Other Ambulatory Visit: Payer: Self-pay | Admitting: Primary Care

## 2012-09-26 DIAGNOSIS — J309 Allergic rhinitis, unspecified: Secondary | ICD-10-CM

## 2012-09-27 ENCOUNTER — Telehealth: Payer: Self-pay | Admitting: Primary Care

## 2012-09-27 NOTE — Telephone Encounter (Signed)
Patient called to speak with you directly in regards to referring her to a OB GYN. She is impairent she speaks with you though. Please call the patent back when you are back in the office next week. Thank you.

## 2012-10-02 ENCOUNTER — Telehealth: Payer: Self-pay | Admitting: Neurology

## 2012-10-02 ENCOUNTER — Telehealth: Payer: Self-pay | Admitting: Primary Care

## 2012-10-02 NOTE — Telephone Encounter (Signed)
See my other note

## 2012-10-02 NOTE — Telephone Encounter (Signed)
She was previously seeing Dr. Hassell Halim, I would have her see Dr. Arma Heading in their office for ongoing management of her osteoporosis; the only other option is that [facility] has replaced Dr. Andree Moro with Dr. Murriel Hopper Soni--I don't have any patients who have seen her yet

## 2012-10-02 NOTE — Telephone Encounter (Signed)
Patient calling regarding discussing switching her medication from tegretol to lamictal.  Please call.  Thanks.

## 2012-10-02 NOTE — Telephone Encounter (Signed)
Pt called wrt prolia said she cant do it without gyn? Sounds like you and pt had spoke about this prior. Please advise

## 2012-10-03 ENCOUNTER — Telehealth: Payer: Self-pay | Admitting: Primary Care

## 2012-10-03 NOTE — Telephone Encounter (Signed)
Pt needs the name for a new GYN in Dr [employer] office and wants to discuss with you first d/t osteoporosis.  This is semi urgent as she needs a script for Prolia

## 2012-10-03 NOTE — Telephone Encounter (Signed)
I spoke with pt, she is going to schedule with Dr. Arma Heading

## 2012-10-07 ENCOUNTER — Ambulatory Visit: Payer: Self-pay | Admitting: Rehabilitative and Restorative Service Providers"

## 2012-10-07 DIAGNOSIS — M84376A Stress fracture, unspecified foot, initial encounter for fracture: Secondary | ICD-10-CM

## 2012-10-07 DIAGNOSIS — S5290XA Unspecified fracture of unspecified forearm, initial encounter for closed fracture: Secondary | ICD-10-CM

## 2012-10-07 NOTE — Progress Notes (Signed)
Sports PT Daily Note    [name]   [id number]   Encounter Diagnoses   Name Primary?   . Stress fracture of the metatarsals Yes   . Closed fracture of unspecified part of radius with ulna          Subjective:  Pain Score:  0  Pain: Improved, decreased complaints of pain in B feet    Objective:  ROM - Unchanged  Strength - Ther Ex per flowsheet  Function: - Unchanged  Education:  Verbal cues for ther ex    Objective     see flow sheet    Treatment Modalities:  Electrical Stimulation, unattended, Type  Interferential , Ther Exercise per flowsheet  Estim to B feet    Assessment:  Patient responding favorably    Plan of Care:  Continue per Plan of care -  As written    Thank you for referring this patient to [facility]    [employer]          Manual stretching wrist flex, ext, sup, pronation  5 min        Gait training  5 min    Agility ladder side steps     Agility ladder  5 min   Hurdle step overs     Marching     Balance training with perturbations  5 min        Rockerboard lateral weight shifts with airex     Leg Press  4p 3x10   HS curl    LAQ 1.5p 3x10        Rice bucket finger flex/ext     Rice bucket wrist sup/pro     Flexbar wrist flex/ext     Flexbar wrist pro/sup     Dumbbell wrist pro/sup  30x 5lbs        T-band    - B row    - ER [place] 3x10       QS     SLR     Standing calf stretch with wedge     Calf raises

## 2012-10-21 ENCOUNTER — Ambulatory Visit: Payer: Self-pay | Admitting: Rehabilitative and Restorative Service Providers"

## 2012-10-21 DIAGNOSIS — S5290XA Unspecified fracture of unspecified forearm, initial encounter for closed fracture: Secondary | ICD-10-CM

## 2012-10-21 DIAGNOSIS — M84376A Stress fracture, unspecified foot, initial encounter for fracture: Secondary | ICD-10-CM

## 2012-10-21 NOTE — Progress Notes (Signed)
SPORTS REHABILITATION PT LE STATUS      Subjective    [name] is a 56 y.o. female who is present today for continued treatment of her B UE/LE weakness and balance deficits.  Mechanism of injury/history of symptoms: No specific cause  Reports that she has received a new set of orthotics and in working through the break in period    Attendance:  Good    Compliance:  Good   Relevant symptoms: Pain , Decreased ROM, Decreased strength, Instability, balance deficits    Symptom frequency: Persistent  Symptom intensity (0 - 10 scale): Now 0 Best 0 Worst 0  Complains of L > R wrist tightness/soreness with ADL's  No complaint of LE or wrist pain on this date    Patient's goals for therapy: Reduce pain, Increase ROM, Increase strength, Independent with home program, improve balance     Objective:    Observation: WNL   Palpation:  WNL     ROM/Strength:    bilateral knee, bilateral ankle          LE LEFT   RIGHT   Strength    PROM AROM PROM AROM Left Right   Knee ext     4/5 4/5   Knee flex     4/5 4/5   Ankle DF     4/5 4/5   Ankle PF     3+/5 3+/5            Wrist flex     4/5 4/5   Wrist ext     4/5 4/5   Wrist sup/pro     4/5 4/5     Continued inability to maintain SLS for greater than 5 sec on L or R LE's      Special Tests:   Hip: N/A     Knee: N/A     Ankle: N/A     Functional:  Perform self-care activities/basic ADLs - able to perform.  Walk more than 10 minutes - able to perform.  Ascend stairs with reciprocal gait - unable to perform.  Descend stairs with reciprocal gait - unable to perform.    Assessment  Findings consistent with 56 y.o., female with   Encounter Diagnoses   Name Primary?   . Stress fracture of the metatarsals Yes   . Closed fracture of unspecified part of radius with ulna      with ROM limitations, strength limitations, functional limitations, balance deficits    Prognosis:  Fair    Contraindications/Precautions/Limitation:  Per diagnosis  Long Term Goals:  Pain/Sx 0 - minimal, ROM/  flexibility WNL , Restoration of functional strength, Functional return to ADLs / activites without limitation        Treatment Plan:   Patient/family involved in developing goals and treatment plan: yes  Freq 1 times per week(s) for 3 month(s)     Exercise: AROM, AAROM, PROM, Progressive Resistive   Manual Techniques:  N/A   Modalities:  NA    Functional: Proprioception/Dynamic stability, Functional rehab       Thank you for referring this patient to [employer] [facility].    Augusten Lipkin        Manual stretching wrist flex, ext, sup, pronation  5 min        Gait training  5 min    Agility ladder side steps     Agility ladder    Hurdle step overs     Marching  Balance training with perturbations  5'       Rockerboard lateral weight shifts with airex     Leg Press  4p 3x10   HS curl    LAQ 1.5p 3x10       Rice bucket finger flex/ext     Rice bucket wrist sup/pro     Flexbar wrist flex/ext  30x   Flexbar wrist pro/sup  30x   Dumbbell wrist pro/sup  30x 5lbs        T-band    - B row    - ER        QS     SLR     Standing calf stretch with wedge     Calf raises

## 2012-10-22 ENCOUNTER — Ambulatory Visit
Admit: 2012-10-22 | Discharge: 2012-10-22 | Disposition: A | Discharge status: Home / Self Care | Payer: Self-pay | Source: Ambulatory Visit | Attending: Primary Care | Admitting: Primary Care

## 2012-10-22 ENCOUNTER — Encounter: Payer: Self-pay | Admitting: Primary Care

## 2012-10-22 ENCOUNTER — Ambulatory Visit: Payer: Self-pay | Admitting: Primary Care

## 2012-10-22 VITALS — BP 122/80 | HR 78 | Ht 63.0 in | Wt 124.6 lb

## 2012-10-22 DIAGNOSIS — I10 Essential (primary) hypertension: Secondary | ICD-10-CM

## 2012-10-22 DIAGNOSIS — E785 Hyperlipidemia, unspecified: Secondary | ICD-10-CM

## 2012-10-22 DIAGNOSIS — K219 Gastro-esophageal reflux disease without esophagitis: Secondary | ICD-10-CM

## 2012-10-22 DIAGNOSIS — R569 Unspecified convulsions: Secondary | ICD-10-CM

## 2012-10-22 DIAGNOSIS — M810 Age-related osteoporosis without current pathological fracture: Secondary | ICD-10-CM

## 2012-10-22 DIAGNOSIS — E559 Vitamin D deficiency, unspecified: Secondary | ICD-10-CM

## 2012-10-22 DIAGNOSIS — E039 Hypothyroidism, unspecified: Secondary | ICD-10-CM

## 2012-10-22 LAB — COMPREHENSIVE METABOLIC PANEL
ALT: 28 U/L (ref 0–35)
AST: 22 U/L (ref 0–35)
Albumin: 4.8 g/dL (ref 3.5–5.2)
Alk Phos: 77 U/L (ref 35–105)
Anion Gap: 12 (ref 7–16)
Bilirubin,Total: 0.3 mg/dL (ref 0.0–1.2)
CO2: 30 mmol/L — ABNORMAL HIGH (ref 20–28)
Calcium: 9.6 mg/dL (ref 8.6–10.2)
Chloride: 99 mmol/L (ref 96–108)
Creatinine: 0.67 mg/dL (ref 0.51–0.95)
GFR,Black: 111 *
GFR,Caucasian: 97 *
Glucose: 78 mg/dL (ref 60–99)
Lab: 20 mg/dL (ref 6–20)
Potassium: 4.3 mmol/L (ref 3.3–5.1)
Sodium: 141 mmol/L (ref 133–145)
Total Protein: 7.3 g/dL (ref 6.3–7.7)

## 2012-10-22 LAB — LIPID PANEL
Chol/HDL Ratio: 2.6
Cholesterol: 293 mg/dL — AB
HDL: 114 mg/dL
LDL Calculated: 164 mg/dL
Non HDL Cholesterol: 179 mg/dL
Triglycerides: 74 mg/dL

## 2012-10-22 LAB — FOLATE: Folate: >20 ng/mL (ref >=4.6)

## 2012-10-22 LAB — MAGNESIUM: Magnesium: 1.7 mEq/L (ref 1.3–2.1)

## 2012-10-22 LAB — MULTIPLE ORDERING DOCS

## 2012-10-22 LAB — HIGH SENSITIVITY CRP: CRP,High Sensitivity: 4.4 mg/L — AB

## 2012-10-22 LAB — TSH: TSH: 1.83 u[IU]/mL (ref 0.27–4.20)

## 2012-10-22 LAB — VITAMIN B12: Vitamin B12: 1067 pg/mL — ABNORMAL HIGH (ref 211–946)

## 2012-10-22 MED ORDER — DOXYCYCLINE HYCLATE 100 MG PO TABS *I*
ORAL_TABLET | ORAL | Status: DC
Start: 2012-10-22 — End: 2013-01-01

## 2012-10-22 NOTE — Progress Notes (Signed)
Reason for Visit: Hypertension    Orthopedic glitches--working with a personal trainer     She isn't feeling great the last couple days, stomach off and sinuses are a little full, headachy.  She is just coming down from a busy Thanksgiving.    They are planning to get her the Prolia through Dr. Ranae Pila see him in May.      This is the third day on the new pill.  Piece of toast    Seizure disorder--she recalls the trauma of the switch from depakote to tegretol.  She does feel that the tegretol makes her groggy.  She feels ready to make a change.  The change would be to lamictal.  Patient Active Problem List   Diagnosis Code   . Reported Trauma Neck    . Allergic Rhinitis 477.9   . Irritable Bowel Syndrome 564.1   . Hypertension 401.9   . Dysplastic Nevus 448.1   . Rosacea 695.3   . Osteoporosis 733.00   . Hypothyroidism 244.9   . Scoliosis 737.30   . Urge Incontinence Of Urine 788.31   . A-V Malformation Repair Supratentorial Complex    . Convulsive Disorder 780.39   . 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 Ringing In The Ears (Tinnitus) 388.30   . GERD (gastroesophageal reflux disease) 530.81     Current Outpatient Prescriptions   Medication   . azelastine (ASTELIN) 137 MCG/SPRAY nasal spray   . TEGRETOL-XR 100 MG 12 hr tablet   . levothyroxine (SYNTHROID, LEVOTHROID) 25 MCG tablet   . carBAMazepine (TEGRETOL) 200 MG tablet   . GuaiFENesin (MUCINEX PO)   . loratadine (CLARITIN) 10 MG tablet   . metronidazole (NORITATE) 1 % cream   . cholecalciferol (VITAMIN D) 1000 UNITS capsule   . Calcium Carb-Cholecalciferol 600-200 MG-UNIT TABS   . fluticasone-salmeterol (ADVAIR DISKUS) 250-50 MCG/DOSE diskus inhaler   . carBAMazepine (TEGRETOL XR) 100 MG 12 hr tablet   . azelastine (ASTELIN) 137 MCG/SPRAY nasal spray   . fluticasone (FLONASE) 50 MCG/ACT nasal spray   . ENABLEX 15 MG 24 hr tablet   . amLODIPine (NORVASC) 5 MG tablet   . rabeprazole (ACIPHEX) 20 MG tablet   . doxycycline (VIBRA-TABS)  100 MG tablet   . Denosumab (PROLIA SC)   . Multiple Vitamin (MULTIVITAMIN) per tablet   . Non-System Medication   . nystatin (NYSTOP) 100000 UNIT/GM powder   . NON-SYSTEM MEDICATION *A*     No current facility-administered medications for this visit.       Medication list reviewed and updated, no changes were made today    Exam: BP 122/80  Pulse 78  Ht 1.6 m (5\' 3" )  Wt 56.518 kg (124 lb 9.6 oz)  BMI 22.08 kg/m25  Frill 56 year old lady in no acute distress.  Tympanic membranes are clear bilaterally.  Nasal mucosa is erythematous, no discolored drainage noted.  Oral mucosa is moist with slight posterior pharyngeal cobblestoning and erythema.  No exudate.  Neck is supple without lymphadenopathy.  Lungs are clear to auscultation bilaterally.  Cardiac exam reveals normal S1, S2 with a regular rate and rhythm.  Extremities are without edema.  A/P:  1.  HTN--well controlled on current regimen, BP at goal under 130/80  2.  Hyperlipidemia--check fasting lipid profile and discuss lipid lowering agent pending results, based on the new recommendations, she should be on a statin  3.  Seizure disorder--she is going to discuss with Dr. Soyla Dryer the possibility of changing her anti-epileptic  regimen.  She feels that the tegretol makes her groggy and not as mentally clear as she would like.  She is willing to relinquish her driving privileges while the change is taking place.  4. Viral upper respiratory infection-duration currently short enough that I would not prescribe an antibiotic.  However, given her chronic severe allergic rhinitis, she is at risk for developing a bacterial superinfection.  I given her a prescription for doxycycline to have at home in case the symptoms acutely worsen over the weekend.  She may call is if she has any questions regarding whether or not to take the antibiotic.  5.  Osteoporosis-she is going to followup with Dr. Arma Heading.  In the meantime, she is to continue the prolia, exercise and  ca/d

## 2012-10-25 LAB — CARBAMAZEPINE, FREE & TOTAL
Carbamazepine, Free: 24.8 % (ref 8.0–35.0)
Carbamazepine,Free: 2.5 ug/mL (ref 1.0–3.0)
Carbamazepine,Total: 10.1 ug/mL (ref 4.0–12.0)

## 2012-10-25 LAB — VITAMIN D
25-OH VIT D2: <4 ng/mL
25-OH VIT D3: 50 ng/mL
25-OH Vit Total: 50 ng/mL (ref 30–60)

## 2012-10-28 ENCOUNTER — Ambulatory Visit: Payer: Self-pay | Admitting: Rehabilitative and Restorative Service Providers"

## 2012-10-28 DIAGNOSIS — S5290XA Unspecified fracture of unspecified forearm, initial encounter for closed fracture: Secondary | ICD-10-CM

## 2012-10-28 DIAGNOSIS — M84376A Stress fracture, unspecified foot, initial encounter for fracture: Secondary | ICD-10-CM

## 2012-10-28 NOTE — Progress Notes (Signed)
Sports PT Daily Note    [name]   [id number]   Encounter Diagnoses   Name Primary?   . Stress fracture of the metatarsals Yes   . Closed fracture of unspecified part of radius with ulna          Subjective:  Pain Score:  0  Pain: Improved, reports decreased wrist soreness today    Objective:  ROM - Unchanged  Strength - Ther Ex per flowsheet  Function: - Unchanged  Education:  Verbal cues for ther ex    Objective     see flow sheet    Treatment Modalities:  Ther Exercise per flowsheet    Assessment:  Patient responding favorably    Plan of Care:  Continue per Plan of care -  As written    Thank you for referring this patient to [facility]    [facility]      Difficultly balance with decreased base of support on this date    Manual stretching wrist flex, ext, sup, pronation  5 min        Gait training  5 min    Agility ladder side steps     Agility ladder  5 min   Hurdle step overs     Marching     Balance training with perturbations  5 min        Rockerboard lateral weight shifts with airex     Leg Press  4p 3x10   HS curl    LAQ 1p 3x10        Rice bucket finger flex/ext     Rice bucket wrist sup/pro     Flexbar wrist flex/ext     Flexbar wrist pro/sup     Dumbbell wrist pro/sup  30x 5lbs        T-band    - B row    - ER        QS     SLR     Standing calf stretch with wedge     Calf raises

## 2012-10-29 ENCOUNTER — Telehealth: Payer: Self-pay | Admitting: Primary Care

## 2012-10-29 DIAGNOSIS — Z5181 Encounter for therapeutic drug level monitoring: Secondary | ICD-10-CM

## 2012-10-29 DIAGNOSIS — E785 Hyperlipidemia, unspecified: Secondary | ICD-10-CM

## 2012-10-29 NOTE — Telephone Encounter (Signed)
LDL high, hs CRP elevated.  Will find out Dr. Gillis Santa preference, I would suggest atorvastatin.  Will get back to her once I hear from him

## 2012-10-29 NOTE — Telephone Encounter (Signed)
Pt is wondering how her labs looked?

## 2012-11-04 ENCOUNTER — Other Ambulatory Visit: Payer: Self-pay | Admitting: Neurology

## 2012-11-04 ENCOUNTER — Ambulatory Visit: Payer: Self-pay | Admitting: Rehabilitative and Restorative Service Providers"

## 2012-11-04 DIAGNOSIS — M84376A Stress fracture, unspecified foot, initial encounter for fracture: Secondary | ICD-10-CM

## 2012-11-04 DIAGNOSIS — S5290XA Unspecified fracture of unspecified forearm, initial encounter for closed fracture: Secondary | ICD-10-CM

## 2012-11-04 NOTE — Progress Notes (Signed)
Sports PT Daily Note    [name]   [id number]   Encounter Diagnoses   Name Primary?   . Stress fracture of the metatarsals Yes   . Closed fracture of unspecified part of radius with ulna          Subjective:  Pain Score:  0  Pain: Improved, continues to work in the new orthotics     Objective:  ROM - Unchanged  Strength - Ther Ex per flowsheet  Function: - Unchanged  Education:  Verbal cues for ther ex    Objective     see flow sheet    Treatment Modalities:  Ther Exercise per flowsheet    Assessment:  Patient responding favorably    Plan of Care:  Continue per Plan of care -  As written    Thank you for referring this patient to [facility]    [facility]      Difficultly balance with decreased base of support on this date    Manual stretching wrist flex, ext, sup, pronation  5 min        Gait training  5 min    Agility ladder side steps     Agility ladder  5 min   Hurdle step overs     Marching     Balance training with perturbations  5 min        Rockerboard lateral weight shifts with airex     Leg Press  4p 3x10   HS curl    LAQ 1.5p 3x10        Rice bucket finger flex/ext     Rice bucket wrist sup/pro     Flexbar wrist flex/ext     Flexbar wrist pro/sup     Dumbbell wrist pro/sup  30x 5lbs        T-band    - B row    - ER        QS     SLR     Standing calf stretch with wedge     Calf raises

## 2012-11-05 NOTE — Telephone Encounter (Signed)
2nd fax for Tegretol refill received w/ indication patient wants to pick up by tomorrow 12.18.13

## 2012-11-11 MED ORDER — CARBAMAZEPINE 100 MG PO TB12 *I*
100.0000 mg | ORAL_TABLET | Freq: Two times a day (BID) | ORAL | Status: DC
Start: 2012-11-08 — End: 2013-02-14

## 2012-11-11 MED ORDER — TEGRETOL XR 200 MG PO TB12
ORAL_TABLET | ORAL | Status: DC
Start: 2012-11-04 — End: 2013-02-14

## 2012-11-11 NOTE — Telephone Encounter (Signed)
Following up on refill. Can this be processed today before the Holidays?

## 2012-11-25 ENCOUNTER — Ambulatory Visit: Payer: Self-pay | Admitting: Rehabilitative and Restorative Service Providers"

## 2012-11-26 MED ORDER — ATORVASTATIN CALCIUM 10 MG PO TABS *I*
10.0000 mg | ORAL_TABLET | Freq: Every day | ORAL | Status: DC
Start: 2012-11-26 — End: 2013-09-29

## 2012-11-26 NOTE — Telephone Encounter (Signed)
pls let pt know that Dr. Soyla Dryer did get back to me and he agrees with the low dose atorvastatin (lipitor generic).  He would want her carbamazepine levels checked in two weeks.  I will send in a script to her pharmacy for just 1/2 tab of the lowest dose (5mg  total she will take).  It should be taken with dinner or in the evening, which ever she prefers, but just best for it to be later in the day.

## 2012-11-26 NOTE — Telephone Encounter (Signed)
Pt called back she is aware of message

## 2012-11-26 NOTE — Addendum Note (Signed)
Addended by: Lodema Pilot on: 11/26/2012 02:28 PM     Modules accepted: Orders

## 2012-11-26 NOTE — Telephone Encounter (Signed)
L/m for pt to call office

## 2012-11-30 ENCOUNTER — Other Ambulatory Visit: Payer: Self-pay | Admitting: Primary Care

## 2012-11-30 DIAGNOSIS — N3941 Urge incontinence: Secondary | ICD-10-CM

## 2012-12-03 ENCOUNTER — Ambulatory Visit: Payer: Self-pay | Admitting: Rehabilitative and Restorative Service Providers"

## 2012-12-03 ENCOUNTER — Ambulatory Visit: Payer: Self-pay | Admitting: Neurology

## 2012-12-03 ENCOUNTER — Encounter: Payer: Self-pay | Admitting: Neurology

## 2012-12-03 VITALS — BP 140/68 | HR 82 | Ht 63.0 in | Wt 120.3 lb

## 2012-12-03 DIAGNOSIS — R569 Unspecified convulsions: Secondary | ICD-10-CM

## 2012-12-03 MED ORDER — LAMOTRIGINE 25 MG PO TABS *I*
25.0000 mg | ORAL_TABLET | Freq: Every day | ORAL | Status: DC
Start: 2012-12-03 — End: 2015-03-10

## 2012-12-06 NOTE — Progress Notes (Signed)
She returns today for followup of her seizure disorder and history of cerebral AVM status post resection.  Overall she's been doing very well with no recurrent seizures.  She was recently started on a atorvastatin 5 mg daily for an elevated LDL.  She is scheduled to undergo repeat carbamazepine levels in the near future.  She is also been taking doxycycline for her sinus infection.    She continues to receive physical therapy weekly.  She is noted no change in her tremor.  We discussed again the possibility of discontinuing the Tegretol the hope of improving her energy level.  We would begin Lamictal prior to stopping the Tegretol.    A 16 point review of systems is notable for occasional heartburn, mild urinary incontinence and chronic neck and back pain.    Examination:  Filed Vitals:    12/03/12 0929   BP: 140/68   Pulse: 82   Height: 1.6 m (5\' 3" )   Weight: 54.568 kg (120 lb 4.8 oz)     Mental status: She is alert and oriented.  Her speech remains clear.  Her affect is appropriate.  Cranial nerve examination reveals full versions without nystagmus.  Her visual fields are full.  Power is full throughout.  She has a very mild sustension tremor which is unchanged.  Gait remains mildly wide-based.    Impression: History of intracerebral AVM and residual seizure disorder.  Since she has just started atorvastatin, I recommended that we hold on switching from Tegretol to Lamictal for at least another month.  I provided her with a prescription for 25 mg daily of the Lamictal but she will call-in after her most recent Tegretol level.  Once it is clear that she is able to tolerate the Lipitor, we can begin the switch of her anticonvulsants.  I plan seeing her again in 3 months but will be in touch with her by phone during this process.

## 2012-12-09 ENCOUNTER — Ambulatory Visit: Payer: Self-pay | Admitting: Rehabilitative and Restorative Service Providers"

## 2012-12-16 ENCOUNTER — Ambulatory Visit: Payer: Self-pay | Admitting: Rehabilitative and Restorative Service Providers"

## 2012-12-17 ENCOUNTER — Ambulatory Visit: Payer: Self-pay | Admitting: Rehabilitative and Restorative Service Providers"

## 2012-12-17 DIAGNOSIS — S5290XA Unspecified fracture of unspecified forearm, initial encounter for closed fracture: Secondary | ICD-10-CM

## 2012-12-17 DIAGNOSIS — M84376A Stress fracture, unspecified foot, initial encounter for fracture: Secondary | ICD-10-CM

## 2012-12-17 NOTE — Progress Notes (Signed)
SPORTS REHABILITATION PT LE STATUS      Subjective    [name] is a 57 y.o. female who is present today for continued treatment of her B UE/LE weakness and balance deficits.  Mechanism of injury/history of symptoms: No specific cause  Reports increased L wrist pain with using her L UE to push off    Attendance:  Good    Compliance:  Good   Relevant symptoms: Pain , Decreased ROM, Decreased strength, Instability, balance deficits    Symptom frequency: Persistent  Symptom intensity (0 - 10 scale): Now 0 Best 0 Worst 5  Complains of L > R wrist tightness/soreness with ADL's      Patient's goals for therapy: Reduce pain, Increase ROM, Increase strength, Independent with home program, improve balance     Objective:    Observation: WNL   Palpation:  WNL     ROM/Strength:    bilateral knee, bilateral ankle          LE LEFT   RIGHT   Strength    PROM AROM PROM AROM Left Right   Knee ext     4/5 4/5   Knee flex     4/5 4/5   Ankle DF     4/5 4/5   Ankle PF     3+/5 3+/5            Wrist flex     4/5 4/5   Wrist ext     4/5 4/5   Wrist sup/pro     4/5 4/5     Continued inability to maintain SLS for greater than 5 sec on L or R LE's      Special Tests:   Hip: N/A     Knee: N/A     Ankle: N/A     Functional:  Perform self-care activities/basic ADLs - able to perform.  Walk more than 10 minutes - able to perform.  Ascend stairs with reciprocal gait - unable to perform.  Descend stairs with reciprocal gait - unable to perform.    Assessment  Findings consistent with 57 y.o., female with   Encounter Diagnoses   Name Primary?   . Stress fracture of the metatarsals Yes   . Closed fracture of unspecified part of radius with ulna      with ROM limitations, strength limitations, functional limitations, balance deficits    Prognosis:  Fair    Contraindications/Precautions/Limitation:  Per diagnosis  Long Term Goals:  Pain/Sx 0 - minimal, ROM/ flexibility WNL , Restoration of functional strength, Functional return to ADLs /  activites without limitation        Treatment Plan:   Patient/family involved in developing goals and treatment plan: yes  Freq 1 times per week(s) for 3 month(s)     Exercise: AROM, AAROM, PROM, Progressive Resistive   Manual Techniques:  N/A   Modalities:  NA    Functional: Proprioception/Dynamic stability, Functional rehab       Thank you for referring this patient to [facility].    Loney Hering, PT      Manual stretching wrist flex, ext, sup, pronation  5 min        Gait training  5 min    Agility ladder side steps     Agility ladder  5 min   Hurdle step overs     Marching     Balance training with perturbations  5 min  Rockerboard lateral weight shifts with airex     Leg Press  4p 3x10   HS curl    LAQ 1.5p 3x10        Rice bucket finger flex/ext     Rice bucket wrist sup/pro     Flexbar wrist flex/ext     Flexbar wrist pro/sup     Dumbbell wrist pro/sup  30x 5lbs        T-band    - B row    - ER        QS     SLR     Standing calf stretch with wedge     Calf raises

## 2012-12-19 ENCOUNTER — Telehealth: Payer: Self-pay | Admitting: Neurology

## 2012-12-19 NOTE — Telephone Encounter (Signed)
Patient calls w/ concern re Tegretol scripts. Informed Dr. Soyla Dryer was just sent the forms to obtain prior auth.

## 2012-12-19 NOTE — Telephone Encounter (Signed)
Incoming fax from [facility] indicates Tegretol 100 and 200 mg XR tabs require prior auth. [employer] forms for each emailed to Dr. Soyla Dryer. The forms are faxed to [facility] at fax [phone]

## 2012-12-20 NOTE — Telephone Encounter (Signed)
[name] from [facility] calls on status of prior auth for [name]. Prior auth form for [employer] was emailed to Dr. Soyla Dryer on 1.30.14

## 2012-12-23 ENCOUNTER — Ambulatory Visit: Payer: Self-pay | Admitting: Rehabilitative and Restorative Service Providers"

## 2012-12-23 DIAGNOSIS — M84376A Stress fracture, unspecified foot, initial encounter for fracture: Secondary | ICD-10-CM

## 2012-12-23 DIAGNOSIS — S5290XA Unspecified fracture of unspecified forearm, initial encounter for closed fracture: Secondary | ICD-10-CM

## 2012-12-23 NOTE — Telephone Encounter (Signed)
Patient will be out of her medications tomorrow.  Pharmacy spotted her some pills to get her through the weekend, but she is afraid they will not be able to continue.  Please fill out and fax the form.

## 2012-12-23 NOTE — Progress Notes (Signed)
Orthopedic Sports/Spine  PT Note    [name]   [id number]     Diagnosis: B LE/UE weakness, B wrist fractures    Subjective:  Pain Score:  0  Pain: Unchanged, notes improved balance over the last 2 weeks    Objective:  ROM - Unchanged, AROM  Strength - Ther Ex per flowsheet  Function: - Improved  Education:  Verbal cues for ther ex    Objective     see flowsheet    Treatment:  Ther Exercise per flowsheet    Assessment:  Patient responding favorably    Plan of Care:  Continue per Plan of care -  As written    Thank you for referring this patient to [facility]    Loney Hering, PT        Manual stretching wrist flex, ext, sup, pronation  5 min        Gait training  5 min    Agility ladder side steps     Agility ladder  5 min   Hurdle step overs     Marching     Balance training with perturbations  5 min        Rockerboard lateral weight shifts with airex     Leg Press  4.5p 3x10   HS curl    LAQ 1.5p 3x10        Rice bucket finger flex/ext     Rice bucket wrist sup/pro     Flexbar wrist flex/ext     Flexbar wrist pro/sup     Dumbbell wrist pro/sup  30x 5lbs        T-band    - B row    - ER        QS     SLR     Standing calf stretch with wedge     Calf raises

## 2012-12-24 ENCOUNTER — Ambulatory Visit
Admit: 2012-12-24 | Discharge: 2012-12-24 | Disposition: A | Discharge status: Home / Self Care | Payer: Self-pay | Source: Ambulatory Visit | Attending: Neurology | Admitting: Neurology

## 2012-12-24 ENCOUNTER — Ambulatory Visit: Payer: Self-pay | Admitting: Rehabilitative and Restorative Service Providers"

## 2012-12-24 DIAGNOSIS — R569 Unspecified convulsions: Secondary | ICD-10-CM

## 2012-12-24 NOTE — Telephone Encounter (Signed)
Patient very concerned and upset that this has been going on since Friday.  She has already got a "loan" of pills from pharmacy to get her through a few days and is now out again.

## 2012-12-24 NOTE — Telephone Encounter (Signed)
Prior auth forms completed by Dr. Soyla Dryer; faxed w/ confirmation to [facility]

## 2012-12-24 NOTE — Telephone Encounter (Signed)
[name] still waiting for refill. Informed prior auth request was faxed today; will call her and pharmacy once auth is received.

## 2012-12-24 NOTE — Telephone Encounter (Signed)
Patient calling again regarding Tegratol prior auth scripts, prior auth forms were sent to Dr Soyla Dryer via e-mail.

## 2012-12-25 LAB — CARBAMAZEPINE, FREE & TOTAL
Carbamazepine, Free: 24.3 % (ref 8.0–35.0)
Carbamazepine,Free: 2.8 ug/mL (ref 1.0–3.0)
Carbamazepine,Total: 11.5 ug/mL (ref 4.0–12.0)

## 2012-12-25 NOTE — Telephone Encounter (Addendum)
Spoke w/ Tabitha @ [employer] on status of prior auth for Tegretol. She has upgraded the request to urgent, states we should get a reply by fax today.  Called patient, informed we will call her and pharmacy once auth is received. Call reference number [id number].

## 2012-12-25 NOTE — Telephone Encounter (Addendum)
Incoming fax from [employer]: Tegretol XR  Approved effective 2.5.14 through 12.31.99  Called [facility]: Informed pharmacy of the authorization.  Spoke w/ patient to inform also.

## 2012-12-30 ENCOUNTER — Encounter: Payer: Self-pay | Admitting: Gastroenterology

## 2012-12-30 ENCOUNTER — Ambulatory Visit: Payer: Self-pay | Admitting: Rehabilitative and Restorative Service Providers"

## 2012-12-30 LAB — HM MAMMOGRAPHY

## 2012-12-31 ENCOUNTER — Encounter: Payer: Self-pay | Admitting: Primary Care

## 2013-01-01 ENCOUNTER — Telehealth: Payer: Self-pay | Admitting: Primary Care

## 2013-01-01 MED ORDER — DOXYCYCLINE HYCLATE 100 MG PO TABS *I*
ORAL_TABLET | ORAL | Status: DC
Start: 2013-01-01 — End: 2013-02-14

## 2013-01-01 NOTE — Telephone Encounter (Signed)
Pt is looking for tegretol lab results done by Dr Soyla Dryer.  Also she is currently taking lipitor 5 mg daily, did you want her to incraese to 10 mg at any time or continue taking 5?  Lastly she was on a plane 2 weeks ago and has had sx of a sinus infection, getting better but wonders if you would send in doxy for her.  Please advise

## 2013-01-01 NOTE — Telephone Encounter (Signed)
Free tegretol level is 2.8, total tegretol level is 11.5.  I would hold off on making any changes until we get the follow up lab work, repeat cholesterol panel after 8-10 weeks on the lipitor.    I will send in the doxy.

## 2013-01-01 NOTE — Telephone Encounter (Signed)
Pt called and notified

## 2013-01-06 ENCOUNTER — Ambulatory Visit: Payer: Self-pay | Admitting: Rehabilitative and Restorative Service Providers"

## 2013-01-07 ENCOUNTER — Ambulatory Visit: Payer: Self-pay | Admitting: Rehabilitative and Restorative Service Providers"

## 2013-01-07 DIAGNOSIS — M84376A Stress fracture, unspecified foot, initial encounter for fracture: Secondary | ICD-10-CM

## 2013-01-07 DIAGNOSIS — S5290XA Unspecified fracture of unspecified forearm, initial encounter for closed fracture: Secondary | ICD-10-CM

## 2013-01-07 NOTE — Progress Notes (Signed)
Orthopedic Sports/Spine  PT Note    CASIMERA GERADS   [id number]     Diagnosis: B LE/UE weakness, B wrist fractures    Subjective:  Pain Score:  0  Pain: Unchanged, reports R scapular soreness secondary to tying shoes repetitively     Objective:  ROM - Unchanged, AROM  Strength - Ther Ex per flowsheet  Function: - Improved  Education:  Verbal cues for ther ex    Objective     see flowsheet    Treatment:  Ther Exercise per flowsheet    Assessment:  Patient responding favorably    Plan of Care:  Continue per Plan of care -  As written    Thank you for referring this patient to [facility]    Loney Hering, PT        Manual stretching wrist flex, ext, sup, pronation  5 min        Gait training  5 min    Agility ladder side steps     Agility ladder  5 min   Hurdle step overs     Marching     Balance training with perturbations  5 min        Rockerboard lateral weight shifts with airex     Leg Press  5p 3x10   HS curl    LAQ 1.5p 3x10        Rice bucket finger flex/ext     Rice bucket wrist sup/pro     Flexbar wrist flex/ext     Flexbar wrist pro/sup     Dumbbell wrist pro/sup  30x 5lbs        T-band    - B row Orange 3x10   - ER        QS     SLR     Standing calf stretch with wedge     Calf raises

## 2013-01-13 ENCOUNTER — Ambulatory Visit: Payer: Self-pay | Admitting: Rehabilitative and Restorative Service Providers"

## 2013-01-14 ENCOUNTER — Ambulatory Visit: Payer: Self-pay | Admitting: Rehabilitative and Restorative Service Providers"

## 2013-01-14 DIAGNOSIS — S5290XA Unspecified fracture of unspecified forearm, initial encounter for closed fracture: Secondary | ICD-10-CM

## 2013-01-14 DIAGNOSIS — M84376A Stress fracture, unspecified foot, initial encounter for fracture: Secondary | ICD-10-CM

## 2013-01-14 NOTE — Progress Notes (Signed)
Orthopedic Sports/Spine  PT Note    [name]   [id number]     Diagnosis: B LE/UE weakness, B wrist fractures    Subjective:  Pain Score:  0  Pain: Improved, balance with ADL's, Complains of L wrist pain with pushing off for transfers    Objective:  ROM - Unchanged, AROM  Strength - Ther Ex per flowsheet  Function: - Improved  Education:  Verbal cues for ther ex    Objective     see flowsheet    Treatment:  Ther Exercise per flowsheet    Assessment:  Patient responding favorably    Plan of Care:  Continue per Plan of care -  As written    Thank you for referring this patient to [facility]    Loney Hering, PT        Manual stretching wrist flex, ext, sup, pronation  5 min        Gait training  5 min    Agility ladder side steps     Agility ladder  5 min   Hurdle step overs     Marching     Balance training with perturbations  5 min        Rockerboard lateral weight shifts with airex     Leg Press  5p 3x10   HS curl    LAQ 1.5p 3x10        Rice bucket finger flex/ext     Rice bucket wrist sup/pro     Flexbar wrist flex/ext     Flexbar wrist pro/sup     Dumbbell wrist pro/sup  30x 5lbs        T-band    - B row    - ER        QS     SLR     Standing calf stretch with wedge     Calf raises

## 2013-01-21 ENCOUNTER — Ambulatory Visit: Payer: Self-pay | Admitting: Primary Care

## 2013-01-21 ENCOUNTER — Ambulatory Visit: Payer: Self-pay | Admitting: Rehabilitative and Restorative Service Providers"

## 2013-01-31 ENCOUNTER — Ambulatory Visit: Payer: Self-pay | Admitting: Primary Care

## 2013-02-03 ENCOUNTER — Ambulatory Visit
Admit: 2013-02-03 | Discharge: 2013-02-03 | Disposition: A | Discharge status: Home / Self Care | Payer: Self-pay | Source: Ambulatory Visit | Attending: Primary Care | Admitting: Primary Care

## 2013-02-03 DIAGNOSIS — E785 Hyperlipidemia, unspecified: Secondary | ICD-10-CM

## 2013-02-03 DIAGNOSIS — Z5181 Encounter for therapeutic drug level monitoring: Secondary | ICD-10-CM

## 2013-02-03 LAB — HEPATIC FUNCTION PANEL
ALT: 35 U/L (ref 0–35)
AST: 24 U/L (ref 0–35)
Albumin: 4.7 g/dL (ref 3.5–5.2)
Alk Phos: 86 U/L (ref 35–105)
Bilirubin,Direct: 0.1 mg/dL (ref 0.0–0.3)
Bilirubin,Total: 0.3 mg/dL (ref 0.0–1.2)
Total Protein: 7.2 g/dL (ref 6.3–7.7)

## 2013-02-03 LAB — CK: CK: 65 U/L (ref 34–145)

## 2013-02-04 ENCOUNTER — Ambulatory Visit: Payer: Self-pay | Admitting: Rehabilitative and Restorative Service Providers"

## 2013-02-04 DIAGNOSIS — M84376A Stress fracture, unspecified foot, initial encounter for fracture: Secondary | ICD-10-CM

## 2013-02-04 DIAGNOSIS — S5290XA Unspecified fracture of unspecified forearm, initial encounter for closed fracture: Secondary | ICD-10-CM

## 2013-02-04 NOTE — Progress Notes (Signed)
SPORTS REHABILITATION PT LE STATUS      Subjective    FINDLEY MEGGITT is a 57 y.o. female who is present today for continued treatment of her B UE/LE weakness and balance deficits.  Mechanism of injury/history of symptoms: No specific cause  Overall improved balance and speed of ambulation     Attendance:  Good    Compliance:  Good   Relevant symptoms: Pain , Decreased ROM, Decreased strength, Instability, balance deficits    Symptom frequency: Persistent, wrist pain L>R with pulling off  Symptom intensity (0 - 10 scale): Now 0 Best 0 Worst 5  Complains of L > R wrist tightness/soreness with ADL's      Patient's goals for therapy: Reduce pain, Increase ROM, Increase strength, Independent with home program, improve balance     Objective:    Observation: WNL   Palpation:  WNL     ROM/Strength:    bilateral knee, bilateral ankle          LE LEFT   RIGHT   Strength    PROM AROM PROM AROM Left Right   Knee ext     4/5 4/5   Knee flex     4/5 4/5   Ankle DF     4/5 4/5   Ankle PF     4/5 4/5            Wrist flex     4/5 4/5   Wrist ext     4/5 4/5   Wrist sup/pro     4/5 4/5     Continued inability to maintain SLS for greater than 5 sec on L or R LE's      Special Tests:   Hip: N/A     Knee: N/A     Ankle: N/A     Functional:  Perform self-care activities/basic ADLs - able to perform.  Walk more than 10 minutes - able to perform.  Ascend stairs with reciprocal gait - unable to perform.  Descend stairs with reciprocal gait - unable to perform.    Assessment  Findings consistent with 57 y.o., female with   Encounter Diagnoses   Name Primary?   . Stress fracture of the metatarsals Yes   . Closed fracture of unspecified part of radius with ulna      with ROM limitations, strength limitations, functional limitations, balance deficits    Prognosis:  Fair    Contraindications/Precautions/Limitation:  Per diagnosis  Long Term Goals:  Pain/Sx 0 - minimal, ROM/ flexibility WNL , Restoration of functional strength, Functional  return to ADLs / activites without limitation        Treatment Plan:   Patient/family involved in developing goals and treatment plan: yes  Freq 1 times per week(s) for 3 month(s)     Exercise: AROM, AAROM, PROM, Progressive Resistive   Manual Techniques:  N/A   Modalities:  NA    Functional: Proprioception/Dynamic stability, Functional rehab       Thank you for referring this patient to [facility].    Loney Hering, PT      Manual stretching wrist flex, ext, sup, pronation  5 min        Gait training  5 min    Agility ladder side steps     Agility ladder  5 min   Hurdle step overs     Marching     Balance training with perturbations  5 min  Rockerboard lateral weight shifts with airex     Leg Press  5p 3x10   HS curl    LAQ 1.5p 3x10        Rice bucket finger flex/ext     Rice bucket wrist sup/pro     Flexbar wrist flex/ext     Flexbar wrist pro/sup     Dumbbell wrist pro/sup  30x 5lbs        T-band    - B row    - ER        QS     SLR     Standing calf stretch with wedge     Calf raises

## 2013-02-05 LAB — CARBAMAZEPINE, FREE & TOTAL
Carbamazepine, Free: 23.8 % (ref 8.0–35.0)
Carbamazepine,Free: 3.1 ug/mL — ABNORMAL HIGH (ref 1.0–3.0)
Carbamazepine,Total: 13 ug/mL — ABNORMAL HIGH (ref 4.0–12.0)

## 2013-02-07 ENCOUNTER — Ambulatory Visit: Payer: Self-pay | Admitting: Primary Care

## 2013-02-11 ENCOUNTER — Ambulatory Visit: Payer: Self-pay | Admitting: Neurology

## 2013-02-11 ENCOUNTER — Ambulatory Visit: Payer: Self-pay | Admitting: Rehabilitative and Restorative Service Providers"

## 2013-02-11 DIAGNOSIS — M84376A Stress fracture, unspecified foot, initial encounter for fracture: Secondary | ICD-10-CM

## 2013-02-11 DIAGNOSIS — S5290XA Unspecified fracture of unspecified forearm, initial encounter for closed fracture: Secondary | ICD-10-CM

## 2013-02-11 NOTE — Progress Notes (Signed)
Orthopedic Sports/Spine  PT Note    KARLIEE ALTIZER   [id number]     Diagnosis: B LE/UE weakness, B wrist fractures    Subjective:  Pain Score:  4  Pain: Unchanged, continued complaints of intermittent L wrist pain with pushing off from a chair and sup/pronation ROM    Objective:  ROM - Unchanged, Bilateral, Wrist, AROM  Strength - Ther Ex per flowsheet  Function: - Improved, balance with tandem stance on this date   Education:  Verbal cues for ther ex    Objective     see flowsheet    Treatment:  Ther Exercise per flowsheet    Assessment:  Patient responding favorably    Plan of Care:  Continue per Plan of care -  As written    Thank you for referring this patient to [facility]    Loney Hering, PT        Manual stretching wrist flex, ext, sup, pronation  5 min        Gait training  5 min    Agility ladder side steps     Agility ladder  5 min   Hurdle step overs     Marching     Balance training with perturbations  5 min        Rockerboard lateral weight shifts with airex     Leg Press  5p 3x10   HS curl    LAQ 1.5p 3x10        Rice bucket finger flex/ext     Rice bucket wrist sup/pro     Flexbar wrist flex/ext     Flexbar wrist pro/sup     Dumbbell wrist pro/sup  30x 5lbs        T-band    - B row    - ER        QS     SLR     Standing calf stretch with wedge     Calf raises

## 2013-02-14 ENCOUNTER — Ambulatory Visit: Payer: Self-pay | Admitting: Primary Care

## 2013-02-14 ENCOUNTER — Encounter: Payer: Self-pay | Admitting: Gastroenterology

## 2013-02-14 VITALS — BP 114/80 | HR 60 | Ht 64.5 in | Wt 127.2 lb

## 2013-02-14 DIAGNOSIS — E039 Hypothyroidism, unspecified: Secondary | ICD-10-CM

## 2013-02-14 DIAGNOSIS — Z5181 Encounter for therapeutic drug level monitoring: Secondary | ICD-10-CM

## 2013-02-14 NOTE — Progress Notes (Signed)
Reason for Visit: Results and Follow-up    Her PT allotment has changed, 65 visits per condition per life.  She goes weekly now.     Elevated carbamazepine level--pt has noted some increase in forgetfulness.  Dose has not changed, but she did start atorvastatin since the last time we checked her level.  She has been waiting until she is stable on the atorvastatin to consider changing to lamictal as she had discussed with Dr. Soyla Dryer.    Hypertension-tolerating amlodipine.  Denies CP SOB dizziness or headache.    Hyperlipidemia--CK and LFTs were normal, lipid panel was not completed, will recheck.  Patient denies myalgia.    Hypothyroidism-continues on levothyroxin.  Denies unexplained weight gain, fatigue    Patient asks that I do a breast exam as she recently had a friend diagnosed with breast cancer and she worries about this.    Patient Active Problem List   Diagnosis Code   . Reported Trauma Neck    . Allergic Rhinitis 477.9   . Irritable bowel syndrome 564.1   . Hypertension 401.9   . Dysplastic Nevus 448.1   . Rosacea 695.3   . Osteoporosis 733.00   . Hypothyroidism 244.9   . Scoliosis 737.30   . Urge Incontinence Of Urine 788.31   . A-V Malformation Repair Supratentorial Complex    . Convulsive Disorder 780.39   . 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 Ringing In The Ears (Tinnitus) 388.30   . GERD (gastroesophageal reflux disease) 530.81     Current Outpatient Prescriptions   Medication   . lamoTRIgine (LAMICTAL) 25 MG tablet   . darifenacin (ENABLEX) 15 MG 24 hr tablet   . atorvastatin (LIPITOR) 10 MG tablet   . levothyroxine (SYNTHROID, LEVOTHROID) 25 MCG tablet   . carBAMazepine (TEGRETOL) 200 MG tablet   . GuaiFENesin (MUCINEX PO)   . Denosumab (PROLIA SC)   . loratadine (CLARITIN) 10 MG tablet   . Multiple Vitamin (MULTIVITAMIN) per tablet   . metronidazole (NORITATE) 1 % cream   . cholecalciferol (VITAMIN D) 1000 UNITS capsule   . Calcium Carb-Cholecalciferol 600-200 MG-UNIT TABS   .  carBAMazepine (TEGRETOL XR) 100 MG 12 hr tablet   . azelastine (ASTELIN) 137 MCG/SPRAY nasal spray   . fluticasone (FLONASE) 50 MCG/ACT nasal spray   . Non-System Medication   . amLODIPine (NORVASC) 5 MG tablet   . rabeprazole (ACIPHEX) 20 MG tablet   . NON-SYSTEM MEDICATION *A*     No current facility-administered medications for this visit.       Medication list reviewed and updated, no changes were made today    Exam: BP 114/80  Pulse 60  Ht 1.638 m (5' 4.5")  Wt 57.698 kg (127 lb 3.2 oz)  BMI 21.5 kg/m46  Frail 57 year old lady in no acute distress.  Per patient request, breast exam was completed, fibrocystic breasts noted but no mass.  No axillary adenopathy.  Lungs are clear to auscultation bilaterally.  Cardiac exam reveals normal S1, S2 with regular rate and rhythm.  Abdomen is soft and nontender.  Extremities are without edema.    A/P:  1.  Hyperlipidemia-check fasting lipid profile and adjust atorvastatin accordingly.  Goal LDL is under 100.  2.  History of seizure-I have sent a message to Dr. Fritzi Mandes, as her carbamazepine level was high.  She has not yet started the transition to Lamictal and does not feel comfortable doing so until she is regulated on the dose of atorvastatin.  I  I have asked his advice on whether this dosage needs to be reduced.  Patient does report an increase in forgetfulness.  3.  Hypothyroidism-check TSH and adjust levothyroxine accordingly.  Patient is to continue physical therapyAnd we will continue to write scripts as needed.  A prescription was provided for her orthotics

## 2013-02-17 ENCOUNTER — Other Ambulatory Visit: Payer: Self-pay | Admitting: Neurology

## 2013-02-18 ENCOUNTER — Ambulatory Visit: Payer: Self-pay | Admitting: Rehabilitative and Restorative Service Providers"

## 2013-02-18 ENCOUNTER — Ambulatory Visit
Admit: 2013-02-18 | Discharge: 2013-02-18 | Disposition: A | Discharge status: Home / Self Care | Payer: Self-pay | Source: Ambulatory Visit | Attending: Primary Care | Admitting: Primary Care

## 2013-02-18 DIAGNOSIS — Z5181 Encounter for therapeutic drug level monitoring: Secondary | ICD-10-CM

## 2013-02-18 DIAGNOSIS — M84376A Stress fracture, unspecified foot, initial encounter for fracture: Secondary | ICD-10-CM

## 2013-02-18 DIAGNOSIS — E559 Vitamin D deficiency, unspecified: Secondary | ICD-10-CM

## 2013-02-18 DIAGNOSIS — S5290XA Unspecified fracture of unspecified forearm, initial encounter for closed fracture: Secondary | ICD-10-CM

## 2013-02-18 LAB — TSH: TSH: 1.67 u[IU]/mL (ref 0.27–4.20)

## 2013-02-18 LAB — LIPID PANEL
Chol/HDL Ratio: 2.1
Cholesterol: 239 mg/dL — AB
HDL: 113 mg/dL
LDL Calculated: 111 mg/dL
Non HDL Cholesterol: 126 mg/dL
Triglycerides: 77 mg/dL

## 2013-02-18 NOTE — Progress Notes (Signed)
Orthopedic Sports/Spine  PT Note    CRISTIAN SLAVEY   [id number]     Diagnosis: B LE/UE weakness, B wrist fractures    Subjective:  Pain Score:  4  Pain: Unchanged, L wrist pain, improved balance with ADL    Objective:  ROM - Unchanged, Bilateral, Wrist, AROM  Strength - Ther Ex per flowsheet  Function: - Improved, balance with tandem stance and feet together on this date   Education:  Verbal cues for ther ex    Objective     see flowsheet    Treatment:  Ther Exercise per flowsheet    Assessment:  Patient responding favorably    Plan of Care:  Continue per Plan of care -  As written    Thank you for referring this patient to [facility]    Loney Hering, PT        Manual stretching wrist flex, ext, sup, pronation  5 min        Gait training  5 min    Agility ladder side steps     Agility ladder  5 min   Hurdle step overs     Marching     Balance training with perturbations  5 min        Rockerboard lateral weight shifts with airex     Leg Press  6p 3x10   HS curl    LAQ 1.5p 3x10        Rice bucket finger flex/ext     Rice bucket wrist sup/pro     Flexbar wrist flex/ext     Flexbar wrist pro/sup     Dumbbell wrist pro/sup  30x 5lbs        T-band    - B row    - ER        QS     SLR     Standing calf stretch with wedge     Calf raises

## 2013-02-19 LAB — CARBAMAZEPINE, FREE & TOTAL
Carbamazepine, Free: 25.9 % (ref 8.0–35.0)
Carbamazepine,Free: 2.8 ug/mL (ref 1.0–3.0)
Carbamazepine,Total: 10.8 ug/mL (ref 4.0–12.0)

## 2013-02-20 LAB — VITAMIN D
25-OH VIT D2: <4 ng/mL
25-OH VIT D3: 47 ng/mL
25-OH Vit Total: 47 ng/mL (ref 30–60)

## 2013-02-21 ENCOUNTER — Telehealth: Payer: Self-pay

## 2013-02-21 NOTE — Telephone Encounter (Signed)
Patient has questions about med interactions and dosing.

## 2013-02-21 NOTE — Telephone Encounter (Signed)
Recent fluctuations in CMZ level noted. Now back in usual range of around 10. Recently started atorvastatin 5 mg with good response in lipid levels.    Would recheck CMZ levels in one month. Continue atorvastatin. Will hold on starting transition to lamictal for now.    Agree she should be on Tegretol XR for both 100 and 200 mg tabs.

## 2013-02-25 ENCOUNTER — Ambulatory Visit: Payer: Self-pay | Admitting: Rehabilitative and Restorative Service Providers"

## 2013-02-27 ENCOUNTER — Ambulatory Visit: Payer: Self-pay | Admitting: Rehabilitative and Restorative Service Providers"

## 2013-02-28 ENCOUNTER — Telehealth: Payer: Self-pay | Admitting: Primary Care

## 2013-02-28 MED ORDER — DOXYCYCLINE HYCLATE 100 MG PO TABS *I*
100.0000 mg | ORAL_TABLET | Freq: Two times a day (BID) | ORAL | Status: DC
Start: 2013-02-28 — End: 2013-03-10

## 2013-02-28 NOTE — Telephone Encounter (Signed)
Pt called and notified

## 2013-02-28 NOTE — Telephone Encounter (Signed)
Pt feels she now has a sinus infection with Post nasal drip and ear fullness.  Would like Doxycycline sent to [facility]

## 2013-02-28 NOTE — Telephone Encounter (Signed)
pls let pt know I did this

## 2013-03-03 ENCOUNTER — Encounter (HOSPITAL_COMMUNITY): Payer: Self-pay | Admitting: *Deleted

## 2013-03-03 ENCOUNTER — Emergency Department (HOSPITAL_COMMUNITY): Payer: BC Managed Care – PPO

## 2013-03-03 ENCOUNTER — Inpatient Hospital Stay (HOSPITAL_COMMUNITY)
Admission: EM | Admit: 2013-03-03 | Discharge: 2013-03-06 | DRG: 543 | Disposition: A | Discharge status: Home / Self Care | Payer: BC Managed Care – PPO | Attending: Interventional Cardiology | Admitting: Interventional Cardiology

## 2013-03-03 DIAGNOSIS — I340 Nonrheumatic mitral (valve) insufficiency: Secondary | ICD-10-CM | POA: Diagnosis present

## 2013-03-03 DIAGNOSIS — R0989 Other specified symptoms and signs involving the circulatory and respiratory systems: Secondary | ICD-10-CM | POA: Diagnosis present

## 2013-03-03 DIAGNOSIS — R0609 Other forms of dyspnea: Secondary | ICD-10-CM | POA: Diagnosis present

## 2013-03-03 DIAGNOSIS — I509 Heart failure, unspecified: Secondary | ICD-10-CM

## 2013-03-03 DIAGNOSIS — E785 Hyperlipidemia, unspecified: Secondary | ICD-10-CM | POA: Diagnosis present

## 2013-03-03 DIAGNOSIS — I511 Rupture of chordae tendineae, not elsewhere classified: Secondary | ICD-10-CM | POA: Diagnosis present

## 2013-03-03 DIAGNOSIS — I059 Rheumatic mitral valve disease, unspecified: Principal | ICD-10-CM | POA: Diagnosis present

## 2013-03-03 DIAGNOSIS — Z8679 Personal history of other diseases of the circulatory system: Secondary | ICD-10-CM

## 2013-03-03 DIAGNOSIS — I498 Other specified cardiac arrhythmias: Secondary | ICD-10-CM | POA: Diagnosis present

## 2013-03-03 DIAGNOSIS — I4891 Unspecified atrial fibrillation: Secondary | ICD-10-CM

## 2013-03-03 DIAGNOSIS — I341 Nonrheumatic mitral (valve) prolapse: Secondary | ICD-10-CM

## 2013-03-03 DIAGNOSIS — I5031 Acute diastolic (congestive) heart failure: Secondary | ICD-10-CM

## 2013-03-03 DIAGNOSIS — R609 Edema, unspecified: Secondary | ICD-10-CM | POA: Diagnosis present

## 2013-03-03 HISTORY — DX: Nonrheumatic mitral (valve) insufficiency: I34.0

## 2013-03-03 HISTORY — DX: Cardiac murmur, unspecified: R01.1

## 2013-03-03 HISTORY — DX: Nonrheumatic mitral (valve) prolapse: I34.1

## 2013-03-03 HISTORY — DX: Pure hypercholesterolemia, unspecified: E78.00

## 2013-03-03 LAB — COMPREHENSIVE METABOLIC PANEL
Alkaline Phosphatase: 90 U/L (ref 39–117)
BUN: 12 mg/dL (ref 6–23)
GFR calc Af Amer: 72 mL/min — ABNORMAL LOW (ref >90)
Glucose, Bld: 140 mg/dL — ABNORMAL HIGH (ref 70–99)
Potassium: 4 mEq/L (ref 3.5–5.1)
Total Protein: 7.1 g/dL (ref 6.0–8.3)

## 2013-03-03 LAB — MRSA PCR SCREENING: MRSA by PCR: NEGATIVE

## 2013-03-03 LAB — CBC
MCV: 88 fL (ref 78.0–100.0)
Platelets: 194 10*3/uL (ref 150–400)
RDW: 12.7 % (ref 11.5–15.5)
WBC: 6.6 10*3/uL (ref 4.0–10.5)

## 2013-03-03 LAB — BASIC METABOLIC PANEL
Calcium: 9.5 mg/dL (ref 8.4–10.5)
Chloride: 107 mEq/L (ref 96–112)
Creatinine, Ser: 0.88 mg/dL (ref 0.50–1.10)
GFR calc Af Amer: 82 mL/min — ABNORMAL LOW (ref >90)

## 2013-03-03 LAB — TROPONIN I
Troponin I: <0.3 ng/mL (ref <0.30)
Troponin I: <0.3 ng/mL (ref <0.30)

## 2013-03-03 LAB — MAGNESIUM: Magnesium: 2.2 mg/dL (ref 1.5–2.5)

## 2013-03-03 LAB — TSH: TSH: 2.043 u[IU]/mL (ref 0.350–4.500)

## 2013-03-03 LAB — PRO B NATRIURETIC PEPTIDE: Pro B Natriuretic peptide (BNP): 1624 pg/mL — ABNORMAL HIGH (ref 0–125)

## 2013-03-03 MED ORDER — FUROSEMIDE 10 MG/ML IJ SOLN
40.0000 mg | Freq: Two times a day (BID) | INTRAMUSCULAR | Status: AC
  Administered 2013-03-03 – 2013-03-04 (×2): 40 mg via INTRAVENOUS

## 2013-03-03 MED ORDER — ACETAMINOPHEN 325 MG PO TABS
650.0000 mg | ORAL_TABLET | ORAL | Status: DC | PRN
  Administered 2013-03-03: 650 mg via ORAL
  Filled 2013-03-03: qty 2

## 2013-03-03 MED ORDER — SODIUM CHLORIDE 0.9 % IV SOLN: 250.0000 mL | INTRAVENOUS | Status: DC | PRN

## 2013-03-03 MED ORDER — FUROSEMIDE 10 MG/ML IJ SOLN: 40.0000 mg | Freq: Once | INTRAMUSCULAR | Status: DC

## 2013-03-03 MED ORDER — SODIUM CHLORIDE 0.9 % IJ SOLN: 3.0000 mL | INTRAMUSCULAR | Status: DC | PRN

## 2013-03-03 MED ORDER — METOPROLOL SUCCINATE ER 50 MG PO TB24
50.0000 mg | ORAL_TABLET | Freq: Every day | ORAL | Status: DC
  Administered 2013-03-04 – 2013-03-06 (×3): 50 mg via ORAL
  Filled 2013-03-03 (×4): qty 1

## 2013-03-03 MED ORDER — FUROSEMIDE 10 MG/ML IJ SOLN
40.0000 mg | Freq: Once | INTRAMUSCULAR | Status: AC
  Administered 2013-03-03: 40 mg via INTRAVENOUS
  Filled 2013-03-03: qty 4

## 2013-03-03 MED ORDER — LISINOPRIL 10 MG PO TABS
10.0000 mg | ORAL_TABLET | Freq: Every day | ORAL | Status: DC
  Administered 2013-03-03 – 2013-03-04 (×2): 10 mg via ORAL
  Filled 2013-03-03 (×3): qty 1

## 2013-03-03 MED ORDER — ASPIRIN EC 81 MG PO TBEC
81.0000 mg | DELAYED_RELEASE_TABLET | Freq: Every day | ORAL | Status: DC
  Administered 2013-03-03 – 2013-03-06 (×4): 81 mg via ORAL
  Filled 2013-03-03 (×4): qty 1

## 2013-03-03 MED ORDER — ZOLPIDEM TARTRATE 5 MG PO TABS
5.0000 mg | ORAL_TABLET | Freq: Every evening | ORAL | Status: DC | PRN
  Administered 2013-03-03 – 2013-03-05 (×3): 5 mg via ORAL
  Filled 2013-03-03 (×3): qty 1

## 2013-03-03 MED ORDER — POTASSIUM CHLORIDE CRYS ER 20 MEQ PO TBCR
20.0000 meq | EXTENDED_RELEASE_TABLET | Freq: Two times a day (BID) | ORAL | Status: AC
  Administered 2013-03-03 – 2013-03-04 (×3): 20 meq via ORAL
  Filled 2013-03-03 (×4): qty 1

## 2013-03-03 MED ORDER — ENOXAPARIN SODIUM 40 MG/0.4ML ~~LOC~~ SOLN
40.0000 mg | SUBCUTANEOUS | Status: DC
  Administered 2013-03-03 – 2013-03-05 (×3): 40 mg via SUBCUTANEOUS
  Filled 2013-03-03 (×4): qty 0.4

## 2013-03-03 MED ORDER — ATORVASTATIN CALCIUM 20 MG PO TABS
20.0000 mg | ORAL_TABLET | Freq: Every day | ORAL | Status: DC
  Administered 2013-03-04 – 2013-03-06 (×3): 20 mg via ORAL
  Filled 2013-03-03 (×3): qty 1

## 2013-03-03 MED ORDER — SODIUM CHLORIDE 0.9 % IJ SOLN
3.0000 mL | Freq: Two times a day (BID) | INTRAMUSCULAR | Status: DC
  Administered 2013-03-03 – 2013-03-04 (×2): 3 mL via INTRAVENOUS

## 2013-03-03 MED ORDER — ONDANSETRON HCL 4 MG/2ML IJ SOLN: 4.0000 mg | Freq: Four times a day (QID) | INTRAMUSCULAR | Status: DC | PRN

## 2013-03-03 NOTE — ED Provider Notes (Signed)
History     CSN: [id number]  Arrival date & time 03/03/13  1011   First MD Initiated Contact with Patient 03/03/13 1152      Chief Complaint  Patient presents with  . Shortness of Breath    (Consider location/radiation/quality/duration/timing/severity/associated sxs/prior treatment) HPI Comments: Patient is a 57 y/o F with PMHx of hypercholesterolemia and MVP that was diagnosed 10 years ago, presenting to the ED with shortness of breathe x 1 week, mainly upon exertion. Patient reported that walking long distances she gets out of breathe, along with speaking. Patient stated that she is very active - does yoga three times per week and goes on long walks with her husband during lunch time it is nice out - stated that she has not been able to go to yoga and walks have been limited due to feeling out of breathe. Patient reported that she feels a pulsing sensation in her chest - stated that it primarily occurs when she is laying down - reported that she sees the bed-sheets move up and down with the pulsations coming from the chest. Patient stated that she has been extremely stressed out with work over the past couple of weeks - has been doing 2 jobs at once at the job. Has been having difficulty sleeping- the only time she can get somewhat comfortable is in the chair rather than laying down - has been using her Ambien more often than she usually does. Was seen at an Urgent Care clinic on Sunday EKG and chest x-ray performed - reported cardiomegaly. Denied following up with cardiologist. Associated symptoms mild nausea this morning and headache. Denied chest pain, leg swelling, leg edema, calf pain, visual distortions, tinnitus, facial pain/pressure/swelling, cough, congestion, neck pain, back pain, gi symptoms, urinary symptoms, numbness and paresthesias.    The history is provided by the patient. No language interpreter was used.    Past Medical History  Diagnosis Date  . Mitral valve prolapse   .  High cholesterol     History reviewed. No pertinent past surgical history.  History reviewed. No pertinent family history.  History  Substance Use Topics  . Smoking status: Not on file  . Smokeless tobacco: Not on file  . Alcohol Use: No    OB History   Grav Para Term Preterm Abortions TAB SAB Ect Mult Living                  Review of Systems  Constitutional: Negative for fever and chills.  HENT: Negative for ear pain, congestion, sore throat, trouble swallowing, neck pain and tinnitus.   Eyes: Negative for pain and visual disturbance.  Respiratory: Positive for shortness of breath. Negative for cough and chest tightness.   Cardiovascular: Negative for chest pain and leg swelling.  Gastrointestinal: Positive for nausea. Negative for vomiting, abdominal pain, diarrhea and constipation.  Genitourinary: Negative for dysuria, hematuria, decreased urine volume and difficulty urinating.  Musculoskeletal: Negative for back pain.  Skin: Negative for rash.  Neurological: Positive for headaches. Negative for dizziness, weakness, light-headedness and numbness.  All other systems reviewed and are negative.    Allergies  Review of patient's allergies indicates no known allergies.  Home Medications   Current Outpatient Rx  Name  Route  Sig  Dispense  Refill  . atorvastatin (LIPITOR) 20 MG tablet   Oral   Take 20 mg by mouth daily.         Marland Kitchen ibuprofen (ADVIL,MOTRIN) 200 MG tablet   Oral   Take  400 mg by mouth 2 (two) times daily as needed for pain.         . metoprolol succinate (TOPROL-XL) 50 MG 24 hr tablet   Oral   Take 50 mg by mouth daily. Take with or immediately following a meal.           BP 143/95  Pulse 92  Temp(Src) 98.4 F (36.9 C) (Oral)  Resp 20  Ht 5' 6.5" (1.689 m)  Wt 175 lb 1.6 oz (79.425 kg)  BMI 27.84 kg/m2  SpO2 100%  Physical Exam  Nursing note and vitals reviewed. Constitutional: She is oriented to person, place, and time. She appears  well-developed and well-nourished. No distress.  HENT:  Head: Normocephalic and atraumatic.  Mouth/Throat: Oropharynx is clear and moist. No oropharyngeal exudate.  Eyes: Conjunctivae and EOM are normal. Pupils are equal, round, and reactive to light. Right eye exhibits no discharge. Left eye exhibits no discharge.  Neck: Normal range of motion. Neck supple. No tracheal deviation present. No thyromegaly present.  Cardiovascular: Normal rate, regular rhythm and intact distal pulses.   Murmur heard. Mid-systolic click with ejection murmur noted - patient has history of MVP.  Pulmonary/Chest: Effort normal and breath sounds normal. No respiratory distress. She has no wheezes. She has no rales. She exhibits no tenderness.  Chest expansion normal and symmetrical  Lymphadenopathy:    She has no cervical adenopathy.  Neurological: She is alert and oriented to person, place, and time. No cranial nerve deficit. She exhibits normal muscle tone. Coordination normal.  Skin: Skin is warm and dry. No rash noted. She is not diaphoretic. No erythema.  Psychiatric: She has a normal mood and affect. Her behavior is normal. Thought content normal.    ED Course  Procedures (including critical care time)  Labs Reviewed  BASIC METABOLIC PANEL - Abnormal; Notable for the following:    Glucose, Bld 113 (*)    GFR calc non Af Amer 71 (*)    GFR calc Af Amer 82 (*)    All other components within normal limits  PRO B NATRIURETIC PEPTIDE - Abnormal; Notable for the following:    Pro B Natriuretic peptide (BNP) 1624.0 (*)    All other components within normal limits  CBC  POCT I-STAT TROPONIN I   Dg Chest 2 View  03/03/2013  *RADIOLOGY REPORT*  Clinical Data: Increased shortness of breath  CHEST - 2 VIEW  Comparison: 03/02/2013  Findings: Borderline enlargement of cardiac silhouette. Slight pulmonary vascular congestion. Mild accentuation of interstitial markings question pulmonary edema. Small bibasilar  pleural effusions and atelectasis slightly greater on the right, unchanged. No pneumothorax or acute osseous findings.  IMPRESSION: Suspect minimal CHF, with associated bibasilar effusions and atelectasis, little changed.   Original Report Authenticated By: Ulyses Southward, M.D.     Date: 03/03/2013  Rate: 85  Rhythm: normal sinus rhythm  QRS Axis: normal  Intervals: normal  ST/T Wave abnormalities: normal  Conduction Disutrbances:none  Narrative Interpretation:   Old EKG Reviewed: none available prolonged QT noted     1. Mitral valve prolapse   2. CHF (congestive heart failure)       MDM  Patient is afebrile, normotensive, non-tachycardic, alert and oriented. CBC, BMP negative findings. Troponins negative. Chest xray finding minimal CHF with associated bibasiliar effusions and actelectasis. Elevated BNP (1624). Patient on cardiac monitoring, continuous pulse oximetry, on O2 2L via nasal cannula. Negative STEMI/NSTEMI on EKG. Patient will most likely need an echocardiogram. Discussed case with Dr. Silverio Lay -  Dr. Silverio Lay saw and evaluated patient. Dr. Silverio Lay spoke with [facility] at 1:18 pm - recommended echocardiogram - cardiology is going to see patient. Dr. Katrinka Blazing, from [facility], evaluated patient and diagnosed patient with possible mitral regurgitation - Dr. Katrinka Blazing recommended patient get echocardiogram and be admitted to the hospital for observation.         Raymon Mutton, PA-C 03/03/13 1651

## 2013-03-03 NOTE — ED Notes (Signed)
Pt reports one week ago waking up with sob and abd cramping. Sob has increased since then, having sob with mild exertion and when lying down to sleep. Having palpitations, denies any cp. Able to speak in full sentences at triage.

## 2013-03-03 NOTE — ED Notes (Signed)
Pt placed on 2L O2 due to sats at 88%. NAD noted.

## 2013-03-03 NOTE — ED Notes (Signed)
Vascular tech has completed scan at the bedside. Pt ambulated to the bathroom

## 2013-03-03 NOTE — H&P (Signed)
Ariel Simpson is a 57 y.o. female  Admit date: 03/03/2013 Referring Physician:  [facility] Primary Cardiologist:: HWB Katrinka Blazing, III, MD Chief complaint / reason for admission:  Severe MR with CHF  HPI:  57 yo female with prior h/o PSVT which lead to the diagnosis of MVP with significant MR 10 years ago. She has done well, and been without cardiology follow-up. Over the past 10 days she has noted exertional fatigue, DOE, ankle edema, orthopnea, and cough. She went to the [facility] yesterday and was going to be seeing an [facility] cardiologist Came to ER today because she was unable to sleep due to dyspnea.    PMH:    Past Medical History  Diagnosis Date  . Mitral valve prolapse   . High cholesterol   . Heart murmur     PSH:   History reviewed. No pertinent past surgical history.  ALLERGIES:   Review of patient's allergies indicates no known allergies.  Prior to Admit Meds:   (Not in a hospital admission) Family HX:   History reviewed. No pertinent family history. Social HX:    History   Social History  . Marital Status: Married    Spouse Name: N/A    Number of Children: N/A  . Years of Education: N/A   Occupational History  . Not on file.   Social History Main Topics  . Smoking status: Not on file  . Smokeless tobacco: Not on file  . Alcohol Use: No  . Drug Use: No  . Sexually Active: Not on file   Other Topics Concern  . Not on file   Social History Narrative  . No narrative on file     ROS: Physically fit, a yoga enthusiast, and walks vigorously several times a week. Denies rheumatic fever, endocarditis, chills, fever, anorexia, ad syncope. No recent SVT.  Physical Exam: Blood pressure 134/90, pulse 88, temperature 98.4 F (36.9 C), temperature source Oral, resp. rate 16, height 5' 6.5" (1.689 m), weight 79.425 kg (175 lb 1.6 oz), SpO2 100.00%.    Full "curly-fro" with no distress or pallor. Appears her stated age.  SKIN is unmarkable with no  cyanosis.  HEENT unremarkable  NECK with moderate JVD while sitting. No bruit  CHEST is clear to auscultation and percussion  CARDIAC is remarkable for 4-5/6 holosystolc murmur or NR. Also heard into the left axilla and back.  ABDOMEN is soft with no organomegaly, tenderness, or ascites.  EXTREMITIES are without edema.  NEURO is normal with no focal abnormality    Labs:   Lab Results  Component Value Date   WBC 6.6 03/03/2013   HGB 13.0 03/03/2013   HCT 37.5 03/03/2013   MCV 88.0 03/03/2013   PLT 194 03/03/2013    Recent Labs Lab 03/03/13 1024  NA 141  K 4.8  CL 107  CO2 25  BUN 13  CREATININE 0.88  CALCIUM 9.5  GLUCOSE 113*   BNP    Component Value Date/Time   PROBNP 1624.0* 03/03/2013 1024     Radiology:   *RADIOLOGY REPORT*  Clinical Data: Increased shortness of breath  CHEST - 2 VIEW  Comparison: 03/02/2013  Findings:  Borderline enlargement of cardiac silhouette.  Slight pulmonary vascular congestion.  Mild accentuation of interstitial markings question pulmonary  edema.  Small bibasilar pleural effusions and atelectasis slightly greater  on the right, unchanged.  No pneumothorax or acute osseous findings.  IMPRESSION:  Suspect minimal CHF, with associated bibasilar effusions and  atelectasis,  little changed.  Original Report Authenticated By: Ulyses Southward, M.D.   EKG:   NSR with [id number]  ASSESSMENT:  1. Acute on chronic MR with suspected acute diastolic heart failure. Would not be surprised to find chordal rupture.  2. Acute heart failure, most likely diastolic. R/O systolic dysfunction due to chronic severe volume overload.  3. Prior history of atrial arrhythmias  Plan:  1. Urgent 2 D Doppler echo to assess LVEF and r/o flail mitral leaflet. 2. Consider cultures if temperature or white count elevation 3. Will likely need MV repair after left and right heart cath 4. Diuresis and unloading for symptom relief.  [name] 03/03/2013  2:46 PM

## 2013-03-03 NOTE — Progress Notes (Signed)
  Echocardiogram 2D Echocardiogram has been performed.  Jamilex Bohnsack FRANCES 03/03/2013, 3:54 PM

## 2013-03-03 NOTE — ED Notes (Signed)
Cardiology at bedside.

## 2013-03-03 NOTE — ED Notes (Signed)
Family at bedside. 

## 2013-03-04 ENCOUNTER — Ambulatory Visit: Payer: Self-pay | Admitting: Rehabilitative and Restorative Service Providers"

## 2013-03-04 ENCOUNTER — Other Ambulatory Visit: Payer: Self-pay | Admitting: Interventional Cardiology

## 2013-03-04 LAB — BASIC METABOLIC PANEL
CO2: 30 mEq/L (ref 19–32)
Calcium: 8.8 mg/dL (ref 8.4–10.5)
GFR calc non Af Amer: 64 mL/min — ABNORMAL LOW (ref >90)
Sodium: 141 mEq/L (ref 135–145)

## 2013-03-04 LAB — TROPONIN I: Troponin I: <0.3 ng/mL (ref <0.30)

## 2013-03-04 MED ORDER — METOPROLOL TARTRATE 1 MG/ML IV SOLN
5.0000 mg | INTRAVENOUS | Status: DC | PRN
  Filled 2013-03-04: qty 5

## 2013-03-04 MED ORDER — AMIODARONE HCL 200 MG PO TABS
200.0000 mg | ORAL_TABLET | Freq: Two times a day (BID) | ORAL | Status: DC
  Administered 2013-03-04 – 2013-03-06 (×5): 200 mg via ORAL
  Filled 2013-03-04 (×8): qty 1

## 2013-03-04 MED ORDER — METOPROLOL TARTRATE 1 MG/ML IV SOLN: 5.0000 mg | INTRAVENOUS | Status: DC | PRN

## 2013-03-04 MED ORDER — FUROSEMIDE 40 MG PO TABS
40.0000 mg | ORAL_TABLET | Freq: Every day | ORAL | Status: DC
  Administered 2013-03-04: 40 mg via ORAL
  Filled 2013-03-04 (×2): qty 1

## 2013-03-04 NOTE — Progress Notes (Signed)
Pt returned to Normal sinus rhythm without intervention, Dr. Adolm Joseph at bedside. Pt BP 121/88. IV metoprolol held for now, plan to give first dose of po metoprolol now, waiting medication from pharmacy. Will continue to monitor pt.

## 2013-03-04 NOTE — Progress Notes (Signed)
Pt. HR elevated between 115 - 145, BP 129/93, EKG completed resulting in A-fib with rapid ventricular response. Pt symptomatic with elevated HR. Dr. Adolm Joseph notified, new orders received. Will continue to monitor pt.

## 2013-03-04 NOTE — Progress Notes (Addendum)
Patient Name: Ariel Simpson Date of Encounter: 03/04/2013    SUBJECTIVE: Feels better. Shortness of breath improved to  TELEMETRY:  Sinus tachycardia and sinus rhythm. Had PAF during the night Filed Vitals:   03/04/13 0409 03/04/13 0453 03/04/13 0500 03/04/13 0730  BP:  129/93 121/88 119/74  Pulse:      Temp: 98.4 F (36.9 C)   98.3 F (36.8 C)  TempSrc: Oral   Oral  Resp:  20 20 21   Height:      Weight:   75.7 kg (166 lb 14.2 oz)   SpO2: 93% 94% 94% 95%    Intake/Output Summary (Last 24 hours) at 03/04/13 0901 Last data filed at 03/03/13 2100  Gross per 24 hour  Intake    510 ml  Output   3850 ml  Net  -3340 ml    LABS: Basic Metabolic Panel:  Recent Labs  16/10/96 1024 03/03/13 1637 03/04/13 0418  NA 141 141 141  K 4.8 4.0 3.5  CL 107 103 102  CO2 25 27 30   GLUCOSE 113* 140* 104*  BUN 13 12 16   CREATININE 0.88 0.98 0.95  CALCIUM 9.5 9.7 8.8  MG  --  2.2  --    CBC:  Recent Labs  03/03/13 1024  WBC 6.6  HGB 13.0  HCT 37.5  MCV 88.0  PLT 194   Cardiac Enzymes:  Recent Labs  03/03/13 1638 03/03/13 2154 03/04/13 0418  TROPONINI <0.30 <0.30 <0.30    Radiology/Studies:  No new data  Physical Exam: Blood pressure 119/74, pulse 79, temperature 98.3 F (36.8 C), temperature source Oral, resp. rate 21, height 5' 6.5" (1.689 m), weight 75.7 kg (166 lb 14.2 oz), SpO2 95.00%. Weight change:    4/6 holosystolic murmur of MR  ASSESSMENT:  1. Severe mitral regurgitation with flail posterior leaflet  2. Acute diastolic heart failure, improved with diuresis and unloading  3. PAF during the night  Plan:  1. Will get TCT S. consult to start planning for mitral valve repair/replacement  2. Probable discharge home once stable on oral medical regimen. Probably in a.m.  3. Load with amiodarone  Signed, [name] 03/04/2013, 9:01 AM

## 2013-03-04 NOTE — Care Management Note (Signed)
    Page 1 of 1   03/04/2013     8:38:50 AM   CARE MANAGEMENT NOTE 03/04/2013  Patient:  Ariel Simpson, Ariel Simpson   Account Number:  [id number]  Date Initiated:  03/04/2013  Documentation initiated by:  Junius Creamer  Subjective/Objective Assessment:   adm w heart failure     Action/Plan:   lives w husband, pcp dr vyvyan sun   Anticipated DC Date:     Anticipated DC Plan:        DC Planning Services  CM consult      Choice offered to / List presented to:             Status of service:   Medicare Important Message given?   (If response is "NO", the following Medicare IM given date fields will be blank) Date Medicare IM given:   Date Additional Medicare IM given:    Discharge Disposition:    Per UR Regulation:  Reviewed for med. necessity/level of care/duration of stay  If discussed at Long Length of Stay Meetings, dates discussed:    Comments:  4/15 0838 debbie Selin Eisler rn,bsn

## 2013-03-05 LAB — BASIC METABOLIC PANEL
Calcium: 9.8 mg/dL (ref 8.4–10.5)
GFR calc Af Amer: 62 mL/min — ABNORMAL LOW (ref >90)
GFR calc non Af Amer: 54 mL/min — ABNORMAL LOW (ref >90)
Glucose, Bld: 98 mg/dL (ref 70–99)
Potassium: 4.6 mEq/L (ref 3.5–5.1)
Sodium: 140 mEq/L (ref 135–145)

## 2013-03-05 MED ORDER — SODIUM CHLORIDE 0.9 % IJ SOLN: 3.0000 mL | INTRAMUSCULAR | Status: DC | PRN

## 2013-03-05 MED ORDER — SODIUM CHLORIDE 0.9 % IV SOLN: 250.0000 mL | INTRAVENOUS | Status: DC | PRN

## 2013-03-05 MED ORDER — LISINOPRIL 2.5 MG PO TABS
2.5000 mg | ORAL_TABLET | Freq: Every day | ORAL | Status: DC
  Filled 2013-03-05: qty 1

## 2013-03-05 MED ORDER — SODIUM CHLORIDE 0.9 % IJ SOLN
3.0000 mL | Freq: Two times a day (BID) | INTRAMUSCULAR | Status: DC
  Administered 2013-03-05 (×2): 3 mL via INTRAVENOUS

## 2013-03-05 MED ORDER — POLYETHYLENE GLYCOL 3350 17 G PO PACK
17.0000 g | PACK | Freq: Every day | ORAL | Status: DC
  Administered 2013-03-05 – 2013-03-06 (×2): 17 g via ORAL
  Filled 2013-03-05 (×2): qty 1

## 2013-03-05 MED ORDER — SODIUM CHLORIDE 0.9 % IV SOLN
1.0000 mL/kg/h | INTRAVENOUS | Status: DC
  Administered 2013-03-06: 1 mL/kg/h via INTRAVENOUS

## 2013-03-05 NOTE — ED Provider Notes (Signed)
Medical screening examination/treatment/procedure(s) were conducted as a shared visit with non-physician practitioner(s) and myself.  I personally evaluated the patient during the encounter  Ariel Simpson is a 57 y.o. female hx of HL, MVP here with SOB with exertion for a week and some PND. Exam showed impressive systolic murmur consistent with MVP. Concerned for worsening MVP causing CHF. I called [facility], Dr. Katrinka Blazing, who will admit the patient for observation.    Richardean Canal, MD 03/05/13 [phone]

## 2013-03-05 NOTE — Progress Notes (Signed)
Patient Name: Ariel Simpson Date of Encounter: 03/05/2013    SUBJECTIVE:  She feels woozy this morning. Mild nausea. No chest pain.  TELEMETRY:  No recurrence of atrial fibrillation: Filed Vitals:   03/04/13 1733 03/04/13 1736 03/04/13 2107 03/05/13 0523  BP: 117/69 94/57 97/60  95/62  Pulse: 79 75 77 68  Temp:  97.7 F (36.5 C) 98.1 F (36.7 C) 98.4 F (36.9 C)  TempSrc:  Oral Oral Oral  Resp: 18 18 18 19   Height:  5' 6.5" (1.689 m)    Weight:  75.66 kg (166 lb 12.8 oz)  75.569 kg (166 lb 9.6 oz)  SpO2: 96% 95% 98% 98%    Intake/Output Summary (Last 24 hours) at 03/05/13 0905 Last data filed at 03/05/13 0529  Gross per 24 hour  Intake    203 ml  Output   2250 ml  Net  -2047 ml    LABS: Basic Metabolic Panel:  Recent Labs  47/82/95 1024 03/03/13 1637 03/04/13 0418 03/05/13 0540  NA 141 141 141 140  K 4.8 4.0 3.5 4.6  CL 107 103 102 103  CO2 25 27 30 30   GLUCOSE 113* 140* 104* 98  BUN 13 12 16 23   CREATININE 0.88 0.98 0.95 1.10  CALCIUM 9.5 9.7 8.8 9.8  MG  --  2.2  --   --    CBC:  Recent Labs  03/03/13 1024  WBC 6.6  HGB 13.0  HCT 37.5  MCV 88.0  PLT 194   Cardiac Enzymes:  Recent Labs  03/03/13 1638 03/03/13 2154 03/04/13 0418  TROPONINI <0.30 <0.30 <0.30     Radiology/Studies:  No new data  Physical Exam: Blood pressure 95/62, pulse 68, temperature 98.4 F (36.9 C), temperature source Oral, resp. rate 19, height 5' 6.5" (1.689 m), weight 75.569 kg (166 lb 9.6 oz), SpO2 98.00%. Weight change: -3.765 kg (-8 lb 4.8 oz)   4/6 holosystolic murmur of MR  Lungs clear  No JVD  ASSESSMENT:  1. Severe mitral regurgitation with flail leaflet  2. Mild hypotension secondary to diuresis and medications  3. Atrial for ablation, has not recurred.   Plan:  I. Left and right heart cath in a.m.  2. Hold diuretics and decrease the dose of lisinopril  3. TCTS consultation on Monday as Moss Mc 03/05/2013, 9:05  AM

## 2013-03-06 ENCOUNTER — Other Ambulatory Visit: Payer: Self-pay | Admitting: Cardiology

## 2013-03-06 ENCOUNTER — Encounter (HOSPITAL_COMMUNITY)
Admission: EM | Disposition: A | Discharge status: Home / Self Care | Payer: Self-pay | Source: Home / Self Care | Attending: Interventional Cardiology

## 2013-03-06 ENCOUNTER — Inpatient Hospital Stay (HOSPITAL_BASED_OUTPATIENT_CLINIC_OR_DEPARTMENT_OTHER)
Admission: RE | Admit: 2013-03-06 | Payer: BC Managed Care – PPO | Source: Ambulatory Visit | Admitting: Interventional Cardiology

## 2013-03-06 ENCOUNTER — Encounter (HOSPITAL_BASED_OUTPATIENT_CLINIC_OR_DEPARTMENT_OTHER): Admission: RE | Payer: Self-pay | Source: Ambulatory Visit

## 2013-03-06 HISTORY — PX: LEFT AND RIGHT HEART CATHETERIZATION WITH CORONARY ANGIOGRAM: SHX5449

## 2013-03-06 LAB — POCT I-STAT 3, VENOUS BLOOD GAS (G3P V)
Acid-base deficit: 3 mmol/L — ABNORMAL HIGH (ref 0.0–2.0)
O2 Saturation: 61 %
pO2, Ven: 34 mmHg (ref 30.0–45.0)

## 2013-03-06 LAB — BASIC METABOLIC PANEL
BUN: 20 mg/dL (ref 6–23)
CO2: 28 mEq/L (ref 19–32)
Chloride: 100 mEq/L (ref 96–112)
Creatinine, Ser: 0.91 mg/dL (ref 0.50–1.10)
GFR calc Af Amer: 78 mL/min — ABNORMAL LOW (ref >90)
Glucose, Bld: 87 mg/dL (ref 70–99)
Potassium: 4 mEq/L (ref 3.5–5.1)

## 2013-03-06 LAB — POCT I-STAT 3, ART BLOOD GAS (G3+)
O2 Saturation: 91 %
pCO2 arterial: 39.5 mmHg (ref 35.0–45.0)
pO2, Arterial: 60 mmHg — ABNORMAL LOW (ref 80.0–100.0)

## 2013-03-06 SURGERY — JV LEFT AND RIGHT HEART CATHETERIZATION WITH CORONARY ANGIOGRAM

## 2013-03-06 SURGERY — LEFT AND RIGHT HEART CATHETERIZATION WITH CORONARY ANGIOGRAM
Anesthesia: LOCAL

## 2013-03-06 MED ORDER — SODIUM CHLORIDE 0.9 % IV SOLN: 1.0000 mL/kg/h | INTRAVENOUS | Status: DC

## 2013-03-06 MED ORDER — FENTANYL CITRATE 0.05 MG/ML IJ SOLN
INTRAMUSCULAR | Status: AC
  Filled 2013-03-06: qty 2

## 2013-03-06 MED ORDER — OXYCODONE-ACETAMINOPHEN 5-325 MG PO TABS: 1.0000 | ORAL_TABLET | ORAL | Status: DC | PRN

## 2013-03-06 MED ORDER — AMIODARONE HCL 200 MG PO TABS: 200.0000 mg | ORAL_TABLET | Freq: Two times a day (BID) | ORAL | Status: DC

## 2013-03-06 MED ORDER — FUROSEMIDE 20 MG PO TABS: 20.0000 mg | ORAL_TABLET | Freq: Every day | ORAL | Status: DC

## 2013-03-06 MED ORDER — ACETAMINOPHEN 325 MG PO TABS: 650.0000 mg | ORAL_TABLET | ORAL | Status: DC | PRN

## 2013-03-06 MED ORDER — LISINOPRIL 5 MG PO TABS: 5.0000 mg | ORAL_TABLET | Freq: Every day | ORAL | Status: DC

## 2013-03-06 MED ORDER — DIAZEPAM 5 MG PO TABS: 5.0000 mg | ORAL_TABLET | ORAL | Status: DC

## 2013-03-06 MED ORDER — ONDANSETRON HCL 4 MG/2ML IJ SOLN: 4.0000 mg | Freq: Four times a day (QID) | INTRAMUSCULAR | Status: DC | PRN

## 2013-03-06 MED ORDER — MIDAZOLAM HCL 2 MG/2ML IJ SOLN
INTRAMUSCULAR | Status: AC
  Filled 2013-03-06: qty 2

## 2013-03-06 MED ORDER — LIDOCAINE HCL (PF) 1 % IJ SOLN
INTRAMUSCULAR | Status: AC
  Filled 2013-03-06: qty 30

## 2013-03-06 MED ORDER — HEPARIN (PORCINE) IN NACL 2-0.9 UNIT/ML-% IJ SOLN
INTRAMUSCULAR | Status: AC
  Filled 2013-03-06: qty 1000

## 2013-03-06 MED ORDER — FUROSEMIDE 20 MG PO TABS
20.0000 mg | ORAL_TABLET | Freq: Every day | ORAL | Status: DC
  Filled 2013-03-06: qty 1

## 2013-03-06 MED ORDER — ASPIRIN 81 MG PO TBEC: 81.0000 mg | DELAYED_RELEASE_TABLET | Freq: Every day | ORAL | Status: DC

## 2013-03-06 NOTE — Discharge Summary (Signed)
Patient ID: Ariel Simpson MRN: [id number] DOB/AGE: 06-13-1956 57 y.o.  Admit date: 03/03/2013 Discharge date: 03/06/2013  Primary Discharge Diagnosis: Severe mitral regurgitation due to flail posterior leaflet  Secondary Discharge Diagnosis: Acute diastolic heart failure  Paroxysmal atrial fibrillation  Mitral prolapse with longstanding MR  Hyperlipidemia  Significant Diagnostic Studies: 1. 2-D doppler echocardiogram 2. Left and right heart catheterization with coronary angiography  Consults: None  Hospital Course: 57 yo with long standing MR from MVP presented with 10 to 14 day history of DOE, fatigue and orthopnea. Found to have acute diastolic heart failure with mld pulmonary congestion on CXR and elevated BNP. Therapy with ACE and diuretic led to brisk diuresis and clinical improvement. Echocardiogram revealed a flail posterior leaflet with severe MR. LVEF was 70 %. Cultures were negative for bacteremia. Paroxysmal atrial fibrillation occurred on the first hospital day and reverted spontaneously. Amiodarone was started to prevent recurrences while we wait for surgery.  Left and right heart cath revealed normal coronary arteries and moderate pulmonary hypertension. The study confirmed severe MR.  She is discharged on medical therapy with appointment to see me and Dr. Cornelius Moras on 4/21 and 03/11/13 respectively.   Discharge Exam: Blood pressure 98/68, pulse 77, temperature 98.4 F (36.9 C), temperature source Oral, resp. rate 20, height 5' 6.5" (1.689 m), weight 75.524 kg (166 lb 8 oz), SpO2 90.00%.   3-4/6 holosystolic murmur of MR. No gallop. Right groin cath site unremarkable. Labs:   Lab Results  Component Value Date   WBC 6.6 03/03/2013   HGB 13.0 03/03/2013   HCT 37.5 03/03/2013   MCV 88.0 03/03/2013   PLT 194 03/03/2013    Recent Labs Lab 03/03/13 1637  03/06/13 0445  NA 141  < > 136  K 4.0  < > 4.0  CL 103  < > 100  CO2 27  < > 28  BUN 12  < > 20  CREATININE  0.98  < > 0.91  CALCIUM 9.7  < > 9.7  PROT 7.1  --   --   BILITOT 0.8  --   --   ALKPHOS 90  --   --   ALT 16  --   --   AST 16  --   --   GLUCOSE 140*  < > 87  < > = values in this interval not displayed. Lab Results  Component Value Date   TROPONINI <0.30 03/04/2013       Radiology: CHEST - 2 VIEW  Comparison: 03/02/2013  Findings:  Borderline enlargement of cardiac silhouette.  Slight pulmonary vascular congestion.  Mild accentuation of interstitial markings question pulmonary  edema.  Small bibasilar pleural effusions and atelectasis slightly greater  on the right, unchanged.  No pneumothorax or acute osseous findings.  IMPRESSION:  Suspect minimal CHF, with associated bibasilar effusions and  atelectasis, little changed.  Original Report Authenticated By: Ulyses Southward, M.D.  EKG: NSR with prominent voltage  ECHOCARDIOGRAM: Study Conclusions  - Left ventricle: The cavity size was normal. Systolic function was vigorous. The estimated ejection fraction was in the range of 65% to 70%. - Mitral valve: Prolapse. Severe, holosystolic prolapse with flail segment involving the posterior leaflet. Cannot exclude vegetation. Severe regurgitation. - Right atrium: The atrium was mildly dilated. - Tricuspid valve: Moderate regurgitation. - Pulmonary arteries: Systolic pressure was moderately increased. PA peak pressure: 47mm Hg (S).   Right and Left Heart Cath 03/06/13:  IMPRESSIONS:  1. Severe mitral regurgitation  2. Normal/hyperdynamic left  ventricular systolic function.  3. Normal coronary arteries  4. Moderate pulmonary hypertension  FOLLOW UP PLANS AND APPOINTMENTS  Future Appointments Provider Department Dept Phone   03/11/2013 2:00 PM Purcell Nails, MD [facility] [phone]       Medication List    STOP taking these medications       ibuprofen 200 MG tablet  Commonly known as:  ADVIL,MOTRIN      TAKE these  medications       amiodarone 200 MG tablet  Commonly known as:  PACERONE  Take 1 tablet (200 mg total) by mouth 2 (two) times daily.     aspirin 81 MG EC tablet  Take 1 tablet (81 mg total) by mouth daily.     atorvastatin 20 MG tablet  Commonly known as:  LIPITOR  Take 20 mg by mouth daily.     furosemide 20 MG tablet  Commonly known as:  LASIX  Take 1 tablet (20 mg total) by mouth daily.     lisinopril 5 MG tablet  Commonly known as:  PRINIVIL,ZESTRIL  Take 1 tablet (5 mg total) by mouth daily.     metoprolol succinate 50 MG 24 hr tablet  Commonly known as:  TOPROL-XL  Take 50 mg by mouth daily. Take with or immediately following a meal.           Follow-up Information   Follow up with [name], MD On 03/10/2013. (12 noon.)    Contact information:   [address] [place] [address] [phone]       BRING ALL MEDICATIONS WITH YOU TO FOLLOW UP APPOINTMENTS  Time spent with patient to include physician time: 30 minutes Signed: [name] 03/06/2013, 3:58 PM

## 2013-03-06 NOTE — CV Procedure (Addendum)
     Diagnostic Cardiac Catheterization Report  Ariel Simpson  57 y.o.  female [birth year]  Procedure Date: 03/06/2013 Referring Physician: Mila Palmer, M.D. Primary Cardiologist:: Gwynneth Albright, M.D.   PROCEDURE:  Left heart catheterization with selective coronary angiography, left ventriculogram.  INDICATIONS:   Severe mitral regurgitation producing heart failure. Echo with evidence of flail posterior mitral valve leaflet. The studies being done to assess the status of the patient's coronaries seen she is 56 years of age.  The risks, benefits, and details of the procedure were explained to the patient.  The patient verbalized understanding and wanted to proceed.  Informed written consent was obtained.  PROCEDURE TECHNIQUE:  After Xylocaine anesthesia a 7 French right femoral vein and 5 French right femoral artery sheaths was placed with a single anterior needle wall sticks.   Coronary angiography was done using a 5 French A2 MP and JR 4 catheters.  Left ventriculography was done using a 5 French angled pigtail catheter.    CONTRAST:  Total of 100 cc.  COMPLICATIONS:  None.    HEMODYNAMICS:  Aortic pressure was 101/68 mmHg; LV pressure was 106/7 mmHg; LVEDP 14 mm mercury; RA mean 4 mm mercury; RV 45/3 mmHg; PA 47/20 mmHg; main pulmonary capillary wedge 17 mmHg, with a V wave to 25 mm mercury; thermodilution cardiac output 4.4 L per minute Fick cardiac output 4.7 L per minute.  There was no gradient between the left ventricle and aorta.    ANGIOGRAPHIC DATA:   The left main coronary artery is normal.  The left anterior descending artery is widely patent with 15-20% mid vessel narrowing. LAD is transapical..  The left circumflex artery is normal.  The right coronary artery is normal.  LEFT VENTRICULOGRAM:  Left ventricular angiogram was done in the 30 RAO projection and revealed hyperdynamic left ventricular wall motion and systolic function with an estimated ejection  fraction of 80%. There is 4+ mitral regurgitation. The left atrium appears dilated.  IMPRESSIONS:  1. Severe mitral regurgitation  2. Normal/hyperdynamic left ventricular systolic function.  3. Normal coronary arteries  4. Moderate pulmonary hypertension   RECOMMENDATION:  Refer to Dr. Tressie Stalker for consideration of mitral valve repair/replacement. Will discuss with Dr. Cornelius Moras whether or not TEE will be required prior to surgery. Anticipate discharge later today

## 2013-03-06 NOTE — H&P (Signed)
Acute CHF related to flail mitral leaflet. Has responded to therapy. Left and right heart cath indicated to r/o CAD. Plan OP TEE and referral to Dr. Cornelius Moras for minimally invasive valve surgery.   The procedure, indication and risks were discussed in detail with the patient. She understands the risk of stroke, MI, death, allergy, kidney injury, limb ischemia, among other less common problems with the procedure and is willing to proceed.

## 2013-03-06 NOTE — Progress Notes (Signed)
All d/c instructions explained and given to pt.  Verbalize understanding.  D/C to home.  Transported off floor via w/c.n  Amanda Pea, RN.

## 2013-03-07 ENCOUNTER — Encounter (HOSPITAL_COMMUNITY): Payer: Self-pay | Admitting: Pharmacist

## 2013-03-10 ENCOUNTER — Telehealth: Payer: Self-pay | Admitting: Primary Care

## 2013-03-10 LAB — CULTURE, BLOOD (ROUTINE X 2)

## 2013-03-10 MED ORDER — CARBAMAZEPINE 100 MG PO TB12 *I*
100.0000 mg | ORAL_TABLET | Freq: Two times a day (BID) | ORAL | Status: DC
Start: 2013-03-10 — End: 2013-11-05

## 2013-03-10 MED ORDER — DOXYCYCLINE HYCLATE 100 MG PO TABS *I*
100.0000 mg | ORAL_TABLET | Freq: Two times a day (BID) | ORAL | Status: DC
Start: 2013-03-10 — End: 2013-03-11

## 2013-03-10 MED ORDER — CALCIUM CARB-CHOLECALCIFEROL 600-200 MG-UNIT PO TABS
1.0000 | ORAL_TABLET | Freq: Three times a day (TID) | ORAL | Status: AC
Start: 2013-03-10 — End: ?

## 2013-03-10 NOTE — Telephone Encounter (Signed)
PATIENT NEEDS MEDS IN 1-2 DAYS

## 2013-03-10 NOTE — Telephone Encounter (Signed)
I signed it.  Please let pt know

## 2013-03-10 NOTE — Telephone Encounter (Signed)
Pt just finished doxycycline and is not feeling better.  She has an appt with you tomorrow and would like  Enough doxy to get her til she sees you.  Please sign if appropriate

## 2013-03-11 ENCOUNTER — Encounter: Payer: Self-pay | Admitting: Primary Care

## 2013-03-11 ENCOUNTER — Ambulatory Visit: Payer: Self-pay | Admitting: Rehabilitative and Restorative Service Providers"

## 2013-03-11 ENCOUNTER — Ambulatory Visit: Payer: Self-pay | Admitting: Primary Care

## 2013-03-11 ENCOUNTER — Encounter: Payer: Self-pay | Admitting: Thoracic Surgery (Cardiothoracic Vascular Surgery)

## 2013-03-11 ENCOUNTER — Institutional Professional Consult (permissible substitution) (INDEPENDENT_AMBULATORY_CARE_PROVIDER_SITE_OTHER): Payer: BC Managed Care – PPO | Admitting: Thoracic Surgery (Cardiothoracic Vascular Surgery)

## 2013-03-11 VITALS — BP 130/78 | HR 78 | Ht 63.0 in | Wt 120.8 lb

## 2013-03-11 VITALS — BP 103/69 | HR 77 | Resp 20 | Ht 66.5 in | Wt 166.0 lb

## 2013-03-11 DIAGNOSIS — I4891 Unspecified atrial fibrillation: Secondary | ICD-10-CM

## 2013-03-11 DIAGNOSIS — I059 Rheumatic mitral valve disease, unspecified: Secondary | ICD-10-CM

## 2013-03-11 DIAGNOSIS — Z8679 Personal history of other diseases of the circulatory system: Secondary | ICD-10-CM

## 2013-03-11 DIAGNOSIS — I480 Paroxysmal atrial fibrillation: Secondary | ICD-10-CM

## 2013-03-11 DIAGNOSIS — I340 Nonrheumatic mitral (valve) insufficiency: Secondary | ICD-10-CM

## 2013-03-11 DIAGNOSIS — J029 Acute pharyngitis, unspecified: Secondary | ICD-10-CM

## 2013-03-11 DIAGNOSIS — T280XXA Burn of mouth and pharynx, initial encounter: Secondary | ICD-10-CM

## 2013-03-11 HISTORY — DX: Paroxysmal atrial fibrillation: I48.0

## 2013-03-11 MED ORDER — IPRATROPIUM-ALBUTEROL 20-100 MCG/ACT IN AERS *I*
1.0000 | INHALATION_SPRAY | Freq: Four times a day (QID) | RESPIRATORY_TRACT | Status: AC
Start: 2013-03-11 — End: 2013-09-07

## 2013-03-11 MED ORDER — DOXYCYCLINE HYCLATE 100 MG PO TABS *I*
100.0000 mg | ORAL_TABLET | Freq: Two times a day (BID) | ORAL | Status: DC
Start: 2013-03-11 — End: 2013-05-16

## 2013-03-11 MED ORDER — IPRATROPIUM BROMIDE HFA 17 MCG/ACT IN AERS *I*
2.0000 | INHALATION_SPRAY | Freq: Four times a day (QID) | RESPIRATORY_TRACT | Status: DC
Start: 2013-03-11 — End: 2013-03-20

## 2013-03-11 NOTE — Telephone Encounter (Signed)
Patient confused by meds called in, said she was prescribed something she does not normally take and was not aware it was added on.  Please call to discuss

## 2013-03-11 NOTE — Progress Notes (Signed)
Reason for Visit: Cough    She had a sinus infection, was due to finish the doxy on Sunday.   She called the allergist on Saturday, was put on prednisone 20mg  twice daily for four days and they put her on advair.  She is taking fluticasone, claritin and astlin.  She is still on the doxy.  Still feels the pressure and pain in the head.  She also complains of pressure and a sense that there is "something always there".    Patient Active Problem List   Diagnosis Code   . Reported Trauma Neck    . Allergic Rhinitis 477.9   . Irritable bowel syndrome 564.1   . Hypertension 401.9   . Dysplastic Nevus 448.1   . Rosacea 695.3   . Osteoporosis 733.00   . Hypothyroidism 244.9   . Scoliosis 737.30   . Urge Incontinence Of Urine 788.31   . A-V Malformation Repair Supratentorial Complex    . Convulsive Disorder 780.39   . 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 Ringing In The Ears (Tinnitus) 388.30   . GERD (gastroesophageal reflux disease) 530.81     Current Outpatient Prescriptions   Medication   . predniSONE (DELTASONE) 20 MG tablet   . Calcium Carb-Cholecalciferol 600-200 MG-UNIT TABS   . carBAMazepine (TEGRETOL XR) 100 MG 12 hr tablet   . doxycycline (VIBRA-TABS) 100 MG tablet   . amLODIPine (NORVASC) 5 MG tablet   . darifenacin (ENABLEX) 15 MG 24 hr tablet   . atorvastatin (LIPITOR) 10 MG tablet   . levothyroxine (SYNTHROID, LEVOTHROID) 25 MCG tablet   . carBAMazepine (TEGRETOL) 200 MG tablet   . GuaiFENesin (MUCINEX PO)   . Denosumab (PROLIA SC)   . loratadine (CLARITIN) 10 MG tablet   . Multiple Vitamin (MULTIVITAMIN) per tablet   . metronidazole (NORITATE) 1 % cream   . cholecalciferol (VITAMIN D) 1000 UNITS capsule   . azelastine (ASTELIN) 137 MCG/SPRAY nasal spray   . fluticasone (FLONASE) 50 MCG/ACT nasal spray   . Non-System Medication   . rabeprazole (ACIPHEX) 20 MG tablet   . NON-SYSTEM MEDICATION *A*   . lamoTRIgine (LAMICTAL) 25 MG tablet     No current facility-administered medications for this  visit.       Medication list reviewed and updated, no changes were made today    Exam: BP 130/78  Pulse 78  Ht 1.6 m (5\' 3" )  Wt 54.795 kg (120 lb 12.8 oz)  BMI 21.4 kg/m2  Well-nourished alert conversant 58 year old lady in no acute distress. Oral mucosa with slight white exudate. Tympanic membranes with slight serous effusions.  Nasal mucosa is boggy but without significant erythema or exudate currently.  Neck is supple without lymphadenopathy.  Lungs reveal diffuse wheezes throughout.  Delayed excretory phase.  No crackles.  A/P:  1.  Bronchitis and reactive airway disease-patient remains on doxycycline.  Reviewed the taper of prednisone as her airway still seem irritated.  Patient to call if no relief, next step would be to check a chest x-ray.  No crackles or rhonchi to suggest evidence of pneumonia currently.  2.  Slight oral exudate-screened for thrush given current use of antibiotics and steroids

## 2013-03-11 NOTE — Progress Notes (Signed)
[address]            Ariel Simpson [address]          [phone]     CARDIOTHORACIC SURGERY CONSULTATION REPORT  Referring Provider is [name], MD PCP is Emeterio Reeve, MD  Chief Complaint  Patient presents with  . Mitral Regurgitation    Surgical eval for mitral valve repair, Cardiac Cath 03/06/13, 2D ECHO 03/03/13    HPI:  Patient is a 57 year old married white female from [place] with known history of mitral valve prolapse and paroxysmal supraventricular tachycardia dating back more than 10 years. The patient states that she first developed severe symptomatic tachypalpitations between 10 and 15 years ago. She was diagnosed with paroxysmal supraventricular tachycardia, and at that time an echocardiogram was performed demonstrating mitral valve prolapse with mitral regurgitation. She has not had a followup echocardiogram since then until approximately 2 weeks ago when she developed fairly abrupt onset of severe exertional shortness of breath with chest tightness, dry cough, lower extremity edema and orthopnea.  She presented to the [facility] walk-in clinic and was promptly sent to the emergency department where she was diagnosed with acute diastolic congestive heart failure.  She was admitted to the hospital and transthoracic echocardiogram confirmed the presence of mitral valve prolapse with flail segment of the posterior leaflet of the mitral valve and severe mitral regurgitation. Left ventricular systolic function appear preserved. She underwent left and right heart catheterization by Dr. Katrinka Blazing documenting the presence of normal coronary artery anatomy with no significant coronary artery disease. There was normal left ventricular systolic function with moderate pulmonary hypertension.  The patient's symptoms improved with diuretic therapy. She did develop a transient episode of paroxysmal atrial fibrillation for which she was treated with  amiodarone. She was discharged from the hospital and referred for elective surgical consultation. Transesophageal echocardiogram has been scheduled for tomorrow.  The patient reports that since going home she still has significant exertional shortness of breath and orthopnea but she is improved from how she was at the time of hospital admission. Prior to 2 weeks ago she had not previously experienced any problems of shortness of breath with activity or at rest. Up until recently she has lived a very active lifestyle and enjoys hiking, walking, angiogram on a regular basis. She has had frequent tachypalpitations off and on for more than 10 years. She has never had any dizzy spells nor syncope.    Past Medical History  Diagnosis Date  . Mitral valve prolapse   . High cholesterol   . Heart murmur   . Severe mitral regurgitation 03/03/2013    Acute on chronic with acute heart failure   . Mitral regurgitation   . Paroxysmal supraventricular tachycardia   . Paroxysmal atrial fibrillation 03/11/2013    Past Surgical History  Procedure Laterality Date  . Hysterotomy      No family history on file.  History   Social History  . Marital Status: Married    Spouse Name: N/A    Number of Children: N/A  . Years of Education: N/A   Occupational History  . Not on file.   Social History Main Topics  . Smoking status: Never Smoker   . Smokeless tobacco: Not on file  . Alcohol Use: No  . Drug Use: No  . Sexually Active: Not on file   Other Topics Concern  .  Not on file   Social History Narrative  . No narrative on file    Current Outpatient Prescriptions  Medication Sig Dispense Refill  . amiodarone (PACERONE) 200 MG tablet Take 1 tablet (200 mg total) by mouth 2 (two) times daily.  60 tablet  1  . aspirin EC 81 MG EC tablet Take 1 tablet (81 mg total) by mouth daily.      Marland Kitchen atorvastatin (LIPITOR) 20 MG tablet Take 20 mg by mouth daily.      . furosemide (LASIX) 20 MG tablet Take 20  mg by mouth 2 (two) times daily.      Marland Kitchen lisinopril (PRINIVIL,ZESTRIL) 5 MG tablet Take 1 tablet (5 mg total) by mouth daily.  30 tablet  1  . metoprolol succinate (TOPROL-XL) 50 MG 24 hr tablet Take 25 mg by mouth daily. Take with or immediately following a meal.      . potassium chloride SA (K-DUR,KLOR-CON) 20 MEQ tablet Take 20 mEq by mouth daily.        No current facility-administered medications for this visit.    No Known Allergies    Review of Systems:   General:  decreased appetite, decreased energy, no weight gain, no weight loss, no fever  Cardiac:  no chest pain with exertion, no chest pain at rest, + SOB with mild exertion, no resting SOB, + PND, + orthopnea, + palpitations, + arrhythmia, + atrial fibrillation, + mild LE edema, no dizzy spells, no syncope  Respiratory:  + shortness of breath, no home oxygen, no productive cough, + recent dry cough, no bronchitis, no wheezing, no hemoptysis, no asthma, no pain with inspiration or cough, no sleep apnea, no CPAP at night  GI:   no difficulty swallowing, no reflux, no frequent heartburn, no hiatal hernia, no abdominal pain, + constipation, no diarrhea, no hematochezia, no hematemesis, no melena  GU:   no dysuria,  no frequency, no urinary tract infection, no hematuria, no kidney stones, no kidney disease  Vascular:  no pain suggestive of claudication, no pain in feet, no leg cramps, + varicose veins, no DVT, no non-healing foot ulcer  Neuro:   no stroke, no TIA's, no seizures, no headaches, no temporary blindness one eye,  no slurred speech, no peripheral neuropathy, no chronic pain, no instability of gait, no memory/cognitive dysfunction  Musculoskeletal: no arthritis, no joint swelling, no myalgias, no difficulty walking, normal mobility   Skin:   no rash, no itching, no skin infections, no pressure sores or ulcerations  Psych:   no anxiety, no depression, no nervousness, no unusual recent stress  Eyes:   no blurry vision, +  floaters, no recent vision changes, + wears glasses or contacts  ENT:   no hearing loss, no loose or painful teeth, no dentures, last saw dentist recently  Hematologic:  no easy bruising, no abnormal bleeding, no clotting disorder, no frequent epistaxis  Endocrine:  no diabetes, does not check CBG's at home     Physical Exam:   BP 103/69  Pulse 77  Resp 20  Ht 5' 6.5" (1.689 m)  Wt 166 lb (75.297 kg)  BMI 26.39 kg/m2  SpO2 91%  General:    well-appearing  HEENT:  Unremarkable   Neck:   no JVD, no bruits, no adenopathy   Chest:   clear to auscultation, symmetrical breath sounds, no wheezes, no rhonchi   CV:   RRR, grade IV/VI holosystolic murmur heard all across the precordium  Abdomen:  soft, non-tender, no masses  Extremities:  warm, well-perfused, pulses palpable, mild LE edema  Rectal/GU  Deferred  Neuro:   Grossly non-focal and symmetrical throughout  Skin:   Clean and dry, no rashes, no breakdown   Diagnostic Tests:  Transthoracic Echocardiography  Patient: Ariel Simpson, Ariel Simpson MR #: [id number] Study Date: 03/03/2013 Gender: F Age: 72 Height: 167.6cm Weight: 77.3kg BSA: 1.73m^2 Pt. Status: Room: [facility], Ec SONOGRAPHER Silvano Bilis, RCS ADMITTING Leia Alf, [facility] ATTENDING Fransisca Kaufmann REFERRING Veatrice Kells cc:  ------------------------------------------------------------ LV EF: 65% - 70%  ------------------------------------------------------------ Indications: CHF - 428.0. Mitral regurgitation 424.0.  ------------------------------------------------------------ History: PMH: Palpitations. Dyspnea.  ------------------------------------------------------------ Study Conclusions  - Left ventricle: The cavity size was normal. Systolic function was vigorous. The estimated ejection fraction was in the range of 65% to 70%. - Mitral valve: Prolapse. Severe, holosystolic prolapse with flail  segment involving the posterior leaflet. Cannot exclude vegetation. Severe regurgitation. - Right atrium: The atrium was mildly dilated. - Tricuspid valve: Moderate regurgitation. - Pulmonary arteries: Systolic pressure was moderately increased. PA peak pressure: 47mm Hg (S). Transthoracic echocardiography. M-mode, complete 2D, spectral Doppler, and color Doppler. Height: Height: 167.6cm. Height: 66in. Weight: Weight: 77.3kg. Weight: 170lb. Body mass index: BMI: 27.5kg/m^2. Body surface area: BSA: 1.90m^2. Blood pressure: 134/90. Patient status: Inpatient. Location: Emergency department.  ------------------------------------------------------------  ------------------------------------------------------------ Left ventricle: The cavity size was normal. Systolic function was vigorous. The estimated ejection fraction was in the range of 65% to 70%.  ------------------------------------------------------------ Aortic valve: Mildly thickened leaflets. Doppler: No significant regurgitation.  ------------------------------------------------------------ Aorta: The aorta was normal, not dilated, and non-diseased.  ------------------------------------------------------------ Mitral valve: Moderately thickened leaflets posterior. Moderate myxomatous degeneration. Prolapse. Severe, holosystolic prolapse with flail segment involving the posterior leaflet. Cannot exclude vegetation. Doppler: Severe regurgitation. Mean gradient: 4mm Hg (D). Peak gradient: 13mm Hg (D).  ------------------------------------------------------------ Left atrium: The atrium was normal in size.  ------------------------------------------------------------ Atrial septum: Poorly visualized.  ------------------------------------------------------------ Right ventricle: The cavity size was normal. Wall thickness was normal. Systolic function was  normal.  ------------------------------------------------------------ Pulmonic valve: Structurally normal valve. Cusp separation was normal. Doppler: Transvalvular velocity was within the normal range. Mild regurgitation.  ------------------------------------------------------------ Tricuspid valve: Doppler: Moderate regurgitation.  ------------------------------------------------------------ Pulmonary artery: Poorly visualized. Systolic pressure was moderately increased.  ------------------------------------------------------------ Right atrium: The atrium was mildly dilated.  ------------------------------------------------------------ Pericardium: There was no pericardial effusion.  ------------------------------------------------------------ Systemic veins: Inferior vena cava: The vessel was normal in size; the respirophasic diameter changes were in the normal range (= 50%); findings are consistent with normal central venous pressure.  ------------------------------------------------------------ Post procedure conclusions Ascending Aorta:  - The aorta was normal, not dilated, and non-diseased.  ------------------------------------------------------------  2D measurements Normal Doppler Normal Left ventricle measurements LVID ED, 47.4 mm 43-52 Main pulmonary chord, artery PLAX Pressure, S 47 mm =30 LVID ES, 20.5 mm 23-38 Hg chord, Left ventricle PLAX Ea, lat 14.7 cm/ ------- FS, chord, 57 % >29 ann, tiss s PLAX DP LVPW, ED 9.47 mm ------ E/Ea, lat 9.32 ------- IVS/LVPW 0.94 <1.3 ann, tiss ratio, ED DP Ventricular septum Ea, med 8.44 cm/ ------- IVS, ED 8.87 mm ------ ann, tiss s Aorta DP Root diam, 33 mm ------ E/Ea, med 16.23 ------- ED ann, tiss Left atrium DP AP dim 38 mm ------ Mitral valve AP dim 1.98 cm/m^2 <2.2 Peak E vel 137 cm/ ------- index s Peak A vel 59.2 cm/ ------- s Mean vel, D 86.1 cm/ ------- s Deceleratio 201 ms 150-230 n time Mean  4 mm ------- gradient, D  Hg Peak 13 mm ------- gradient, D Hg Peak E/A 2.3 ------- ratio Annulus VTI 37.5 cm ------- Tricuspid valve Regurg peak 325 cm/ ------- vel s Peak RV-RA 42 mm ------- gradient, S Hg Systemic veins Estimated 5 mm ------- CVP Hg Right ventricle Pressure, S 47 mm <30 Hg Sa vel, lat 22.8 cm/ ------- ann, tiss s DP  ------------------------------------------------------------ Prepared and Electronically Authenticated by  Lyn Records 2014-04-14T19:[id number]    Diagnostic Cardiac Catheterization Report   Tracyann Duffell  57 y.o.  female  May 03, 1956  Procedure Date: 03/06/2013  Referring Physician: Mila Palmer, M.D.  Primary Cardiologist:: Gwynneth Albright, M.D.  PROCEDURE: Left heart catheterization with selective coronary angiography, left ventriculogram.  INDICATIONS: Severe mitral regurgitation producing heart failure. Echo with evidence of flail posterior mitral valve leaflet. The studies being done to assess the status of the patient's coronaries seen she is 57 years of age.  The risks, benefits, and details of the procedure were explained to the patient. The patient verbalized understanding and wanted to proceed. Informed written consent was obtained.  PROCEDURE TECHNIQUE: After Xylocaine anesthesia a 7 French right femoral vein and 5 French right femoral artery sheaths was placed with a single anterior needle wall sticks. Coronary angiography was done using a 5 French A2 MP and JR 4 catheters. Left ventriculography was done using a 5 French angled pigtail catheter.  CONTRAST: Total of 100 cc.  COMPLICATIONS: None.  HEMODYNAMICS: Aortic pressure was 101/68 mmHg; LV pressure was 106/7 mmHg; LVEDP 14 mm mercury; RA mean 4 mm mercury; RV 45/3 mmHg; PA 47/20 mmHg; main pulmonary capillary wedge 17 mmHg, with a V wave to 25 mm mercury; thermodilution cardiac output 4.4 L per minute Fick cardiac output 4.7 L per minute. There was no gradient  between the left ventricle and aorta.  ANGIOGRAPHIC DATA: The left main coronary artery is normal.  The left anterior descending artery is widely patent with 15-20% mid vessel narrowing. LAD is transapical..  The left circumflex artery is normal.  The right coronary artery is normal.  LEFT VENTRICULOGRAM: Left ventricular angiogram was done in the 30 RAO projection and revealed hyperdynamic left ventricular wall motion and systolic function with an estimated ejection fraction of 80%. There is 4+ mitral regurgitation. The left atrium appears dilated.  IMPRESSIONS: 1. Severe mitral regurgitation  2. Normal/hyperdynamic left ventricular systolic function.  3. Normal coronary arteries  4. Moderate pulmonary hypertension  RECOMMENDATION: Refer to Dr. Tressie Stalker for consideration of mitral valve repair/replacement. Will discuss with Dr. Cornelius Moras whether or not TEE will be required prior to surgery. Anticipate discharge later today     Impression:  The patient has mitral valve prolapse with severe mitral regurgitation with relatively recent acute onset of diastolic congestive heart failure. Left ventricular systolic function is preserved. Transthoracic echocardiogram confirmed the presence of mitral valve prolapse with what appears to be a flail segment of the posterior leaflet and severe mitral regurgitation. Left ventricular systolic function appears preserved. The patient also has long-standing history of tachypalpitations with diagnosis of paroxysmal supraventricular tachycardia for more than 10 years and a well-documented episode of paroxysmal atrial fibrillation that occurred during her recent hospitalization. I agree that she needs mitral valve repair in the near future, and she may also benefit from concomitant Maze procedure. She may be a good candidate for minimally invasive approach for surgery.    Plan:  The rationale for elective mitral valve repair surgery has been explained, including a  comparison between surgery and  continued medical therapy with close follow-up.  The likelihood of successful and durable valve repair has been discussed with particular reference to the findings of their recent echocardiogram.  Based upon these findings and previous experience, I have quoted them a greater than 90 percent likelihood of successful valve repair.  The patient is scheduled to have transesophageal echocardiogram tomorrow.  Based upon a transthoracic echocardiogram I feel there is probably a high likelihood that the valve will be repairable.  Whereas the additional information from TEE will not alter the fact that the patient needs surgery, it may be helpful to predict the likelihood of successful valve repair.  In the unlikely event that their valve cannot be successfully repaired, we discussed the possibility of replacing the mitral valve using a mechanical prosthesis with the attendant need for long-term anticoagulation versus the alternative of replacing it using a bioprosthetic tissue valve with its potential for late structural valve deterioration and failure, depending upon the patient's longevity.  The patient specifically requests that if the mitral valve must be replaced that it be done using a mechanical valve.  We also discussed the relative risks and benefits associated with concomitant Maze procedure for treatment of atrial fibrillation. Finally, alternative surgical approaches have been discussed, including a comparison between conventional sternotomy and minimally-invasive techniques.  The relative risks and benefits of each have been reviewed as they pertain to the patient's specific circumstances, and all of their questions have been addressed.  We will obtain CT angiogram of the chest abdomen and pelvis to rule out significant aneurysmal disease or atherosclerotic occlusive disease which might increase risks associated with femoral artery cannulation for surgery. The patient will return  for followup on Monday, April 28 to discuss results of these tests with tentative plans to proceed with surgery on Tuesday, April 29.       Salvatore Decent. Cornelius Moras, MD 03/11/2013 5:59 PM

## 2013-03-12 ENCOUNTER — Other Ambulatory Visit: Payer: Self-pay

## 2013-03-12 ENCOUNTER — Observation Stay (HOSPITAL_COMMUNITY): Payer: BC Managed Care – PPO

## 2013-03-12 ENCOUNTER — Encounter (HOSPITAL_COMMUNITY): Payer: Self-pay | Admitting: *Deleted

## 2013-03-12 ENCOUNTER — Encounter (HOSPITAL_COMMUNITY)
Admission: RE | Disposition: A | Discharge status: Home / Self Care | Payer: Self-pay | Source: Ambulatory Visit | Attending: Interventional Cardiology

## 2013-03-12 ENCOUNTER — Encounter (HOSPITAL_COMMUNITY): Payer: Self-pay | Admitting: Emergency Medicine

## 2013-03-12 ENCOUNTER — Other Ambulatory Visit: Payer: Self-pay | Admitting: *Deleted

## 2013-03-12 ENCOUNTER — Emergency Department (HOSPITAL_COMMUNITY)
Admission: EM | Admit: 2013-03-12 | Discharge: 2013-03-12 | Disposition: A | Discharge status: Home / Self Care | Payer: BC Managed Care – PPO | Attending: Cardiology | Admitting: Cardiology

## 2013-03-12 ENCOUNTER — Emergency Department (HOSPITAL_COMMUNITY): Payer: BC Managed Care – PPO

## 2013-03-12 ENCOUNTER — Inpatient Hospital Stay (HOSPITAL_COMMUNITY)
Admission: RE | Admit: 2013-03-12 | Discharge: 2013-03-23 | DRG: 545 | Disposition: A | Discharge status: Home / Self Care | Payer: BC Managed Care – PPO | Source: Ambulatory Visit | Attending: Thoracic Surgery (Cardiothoracic Vascular Surgery) | Admitting: Thoracic Surgery (Cardiothoracic Vascular Surgery)

## 2013-03-12 DIAGNOSIS — R059 Cough, unspecified: Secondary | ICD-10-CM | POA: Insufficient documentation

## 2013-03-12 DIAGNOSIS — Z8679 Personal history of other diseases of the circulatory system: Secondary | ICD-10-CM | POA: Insufficient documentation

## 2013-03-12 DIAGNOSIS — R0600 Dyspnea, unspecified: Secondary | ICD-10-CM

## 2013-03-12 DIAGNOSIS — D696 Thrombocytopenia, unspecified: Secondary | ICD-10-CM | POA: Diagnosis present

## 2013-03-12 DIAGNOSIS — I4891 Unspecified atrial fibrillation: Secondary | ICD-10-CM

## 2013-03-12 DIAGNOSIS — E785 Hyperlipidemia, unspecified: Secondary | ICD-10-CM | POA: Diagnosis present

## 2013-03-12 DIAGNOSIS — R0989 Other specified symptoms and signs involving the circulatory and respiratory systems: Secondary | ICD-10-CM | POA: Insufficient documentation

## 2013-03-12 DIAGNOSIS — R05 Cough: Secondary | ICD-10-CM | POA: Insufficient documentation

## 2013-03-12 DIAGNOSIS — Z7901 Long term (current) use of anticoagulants: Secondary | ICD-10-CM

## 2013-03-12 DIAGNOSIS — D62 Acute posthemorrhagic anemia: Secondary | ICD-10-CM | POA: Diagnosis not present

## 2013-03-12 DIAGNOSIS — I059 Rheumatic mitral valve disease, unspecified: Secondary | ICD-10-CM | POA: Insufficient documentation

## 2013-03-12 DIAGNOSIS — Z9889 Other specified postprocedural states: Secondary | ICD-10-CM

## 2013-03-12 DIAGNOSIS — R0902 Hypoxemia: Secondary | ICD-10-CM | POA: Diagnosis present

## 2013-03-12 DIAGNOSIS — I471 Supraventricular tachycardia, unspecified: Secondary | ICD-10-CM | POA: Diagnosis present

## 2013-03-12 DIAGNOSIS — J988 Other specified respiratory disorders: Secondary | ICD-10-CM | POA: Diagnosis not present

## 2013-03-12 DIAGNOSIS — Z7982 Long term (current) use of aspirin: Secondary | ICD-10-CM | POA: Insufficient documentation

## 2013-03-12 DIAGNOSIS — J9819 Other pulmonary collapse: Secondary | ICD-10-CM | POA: Diagnosis not present

## 2013-03-12 DIAGNOSIS — I739 Peripheral vascular disease, unspecified: Secondary | ICD-10-CM | POA: Insufficient documentation

## 2013-03-12 DIAGNOSIS — I2789 Other specified pulmonary heart diseases: Secondary | ICD-10-CM | POA: Diagnosis present

## 2013-03-12 DIAGNOSIS — R0609 Other forms of dyspnea: Secondary | ICD-10-CM | POA: Insufficient documentation

## 2013-03-12 DIAGNOSIS — Y9210 Unspecified residential institution as the place of occurrence of the external cause: Secondary | ICD-10-CM | POA: Diagnosis not present

## 2013-03-12 DIAGNOSIS — I5031 Acute diastolic (congestive) heart failure: Secondary | ICD-10-CM

## 2013-03-12 DIAGNOSIS — I480 Paroxysmal atrial fibrillation: Secondary | ICD-10-CM

## 2013-03-12 DIAGNOSIS — I340 Nonrheumatic mitral (valve) insufficiency: Secondary | ICD-10-CM

## 2013-03-12 DIAGNOSIS — I9589 Other hypotension: Secondary | ICD-10-CM | POA: Diagnosis not present

## 2013-03-12 DIAGNOSIS — I498 Other specified cardiac arrhythmias: Secondary | ICD-10-CM | POA: Diagnosis not present

## 2013-03-12 DIAGNOSIS — Z79899 Other long term (current) drug therapy: Secondary | ICD-10-CM

## 2013-03-12 DIAGNOSIS — I509 Heart failure, unspecified: Secondary | ICD-10-CM | POA: Diagnosis present

## 2013-03-12 DIAGNOSIS — E7800 Pure hypercholesterolemia, unspecified: Secondary | ICD-10-CM | POA: Diagnosis present

## 2013-03-12 DIAGNOSIS — Y834 Other reconstructive surgery as the cause of abnormal reaction of the patient, or of later complication, without mention of misadventure at the time of the procedure: Secondary | ICD-10-CM | POA: Diagnosis not present

## 2013-03-12 DIAGNOSIS — R011 Cardiac murmur, unspecified: Secondary | ICD-10-CM | POA: Insufficient documentation

## 2013-03-12 HISTORY — DX: Personal history of other diseases of the circulatory system: Z86.79

## 2013-03-12 HISTORY — DX: Nausea with vomiting, unspecified: R11.2

## 2013-03-12 HISTORY — DX: Shortness of breath: R06.02

## 2013-03-12 HISTORY — DX: Heart failure, unspecified: I50.9

## 2013-03-12 HISTORY — DX: Essential (primary) hypertension: I10

## 2013-03-12 HISTORY — DX: Other specified postprocedural states: Z98.890

## 2013-03-12 LAB — CBC WITH DIFFERENTIAL/PLATELET
Basophils Absolute: 0 10*3/uL (ref 0.0–0.1)
Eosinophils Relative: 2 % (ref 0–5)
Lymphocytes Relative: 29 % (ref 12–46)
MCV: 88.1 fL (ref 78.0–100.0)
Platelets: 279 10*3/uL (ref 150–400)
RDW: 13.3 % (ref 11.5–15.5)
WBC: 7 10*3/uL (ref 4.0–10.5)

## 2013-03-12 LAB — COMPREHENSIVE METABOLIC PANEL
Albumin: 3.9 g/dL (ref 3.5–5.2)
Alkaline Phosphatase: 96 U/L (ref 39–117)
BUN: 18 mg/dL (ref 6–23)
Creatinine, Ser: 1.12 mg/dL — ABNORMAL HIGH (ref 0.50–1.10)
Potassium: 4.6 mEq/L (ref 3.5–5.1)
Total Protein: 7 g/dL (ref 6.0–8.3)

## 2013-03-12 LAB — CBC
Hemoglobin: 12.6 g/dL (ref 12.0–15.0)
MCH: 31 pg (ref 26.0–34.0)
RBC: 4.07 MIL/uL (ref 3.87–5.11)
WBC: 5.6 10*3/uL (ref 4.0–10.5)

## 2013-03-12 LAB — CREATININE, SERUM
Creatinine, Ser: 1.06 mg/dL (ref 0.50–1.10)
GFR calc Af Amer: 65 mL/min — ABNORMAL LOW (ref >90)
GFR calc non Af Amer: 56 mL/min — ABNORMAL LOW (ref >90)

## 2013-03-12 LAB — PULMONARY FUNCTION TEST

## 2013-03-12 LAB — PRO B NATRIURETIC PEPTIDE: Pro B Natriuretic peptide (BNP): 1379 pg/mL — ABNORMAL HIGH (ref 0–125)

## 2013-03-12 LAB — POCT I-STAT TROPONIN I: Troponin i, poc: 0 ng/mL (ref 0.00–0.08)

## 2013-03-12 SURGERY — CANCELLED PROCEDURE

## 2013-03-12 MED ORDER — SODIUM CHLORIDE 0.9 % IJ SOLN: 3.0000 mL | INTRAMUSCULAR | Status: DC | PRN

## 2013-03-12 MED ORDER — POTASSIUM CHLORIDE CRYS ER 20 MEQ PO TBCR
20.0000 meq | EXTENDED_RELEASE_TABLET | Freq: Every day | ORAL | Status: DC
  Administered 2013-03-13 – 2013-03-17 (×5): 20 meq via ORAL
  Filled 2013-03-12 (×7): qty 1

## 2013-03-12 MED ORDER — ASPIRIN EC 81 MG PO TBEC
81.0000 mg | DELAYED_RELEASE_TABLET | Freq: Every day | ORAL | Status: DC
  Administered 2013-03-13 – 2013-03-17 (×5): 81 mg via ORAL
  Filled 2013-03-12 (×8): qty 1

## 2013-03-12 MED ORDER — ALBUTEROL SULFATE (5 MG/ML) 0.5% IN NEBU
2.5000 mg | INHALATION_SOLUTION | Freq: Once | RESPIRATORY_TRACT | Status: AC
  Administered 2013-03-12: 2.5 mg via RESPIRATORY_TRACT

## 2013-03-12 MED ORDER — SODIUM CHLORIDE 0.9 % IV SOLN: 250.0000 mL | INTRAVENOUS | Status: DC | PRN

## 2013-03-12 MED ORDER — ATORVASTATIN CALCIUM 20 MG PO TABS
20.0000 mg | ORAL_TABLET | Freq: Every day | ORAL | Status: DC
  Administered 2013-03-13 – 2013-03-17 (×5): 20 mg via ORAL
  Filled 2013-03-12 (×8): qty 1

## 2013-03-12 MED ORDER — HEPARIN SODIUM (PORCINE) 5000 UNIT/ML IJ SOLN
5000.0000 [IU] | Freq: Three times a day (TID) | INTRAMUSCULAR | Status: AC
  Administered 2013-03-12 – 2013-03-17 (×16): 5000 [IU] via SUBCUTANEOUS
  Filled 2013-03-12 (×19): qty 1

## 2013-03-12 MED ORDER — POLYETHYLENE GLYCOL 3350 17 G PO PACK
17.0000 g | PACK | Freq: Every day | ORAL | Status: DC
  Administered 2013-03-12 – 2013-03-17 (×6): 17 g via ORAL
  Filled 2013-03-12 (×7): qty 1

## 2013-03-12 MED ORDER — ACETAMINOPHEN 325 MG PO TABS: 650.0000 mg | ORAL_TABLET | ORAL | Status: DC | PRN

## 2013-03-12 MED ORDER — FUROSEMIDE 10 MG/ML IJ SOLN
40.0000 mg | Freq: Once | INTRAMUSCULAR | Status: AC
  Administered 2013-03-12: 40 mg via INTRAVENOUS
  Filled 2013-03-12: qty 4

## 2013-03-12 MED ORDER — ONDANSETRON HCL 4 MG/2ML IJ SOLN: 4.0000 mg | Freq: Four times a day (QID) | INTRAMUSCULAR | Status: DC | PRN

## 2013-03-12 MED ORDER — AMIODARONE HCL 200 MG PO TABS
200.0000 mg | ORAL_TABLET | Freq: Two times a day (BID) | ORAL | Status: DC
  Administered 2013-03-12 – 2013-03-17 (×11): 200 mg via ORAL
  Filled 2013-03-12 (×15): qty 1

## 2013-03-12 MED ORDER — FUROSEMIDE 10 MG/ML IJ SOLN
40.0000 mg | Freq: Two times a day (BID) | INTRAMUSCULAR | Status: DC
  Administered 2013-03-12 – 2013-03-17 (×11): 40 mg via INTRAVENOUS
  Filled 2013-03-12 (×14): qty 4

## 2013-03-12 MED ORDER — SODIUM CHLORIDE 0.9 % IJ SOLN
3.0000 mL | Freq: Two times a day (BID) | INTRAMUSCULAR | Status: DC
  Administered 2013-03-12 – 2013-03-17 (×11): 3 mL via INTRAVENOUS

## 2013-03-12 MED ORDER — SODIUM CHLORIDE 0.9 % IV SOLN
INTRAVENOUS | Status: DC
  Administered 2013-03-12: 500 mL via INTRAVENOUS

## 2013-03-12 MED ORDER — ZOLPIDEM TARTRATE 5 MG PO TABS
5.0000 mg | ORAL_TABLET | Freq: Once | ORAL | Status: AC
  Administered 2013-03-12: 5 mg via ORAL
  Filled 2013-03-12: qty 1

## 2013-03-12 NOTE — Telephone Encounter (Signed)
Pt called and notified

## 2013-03-12 NOTE — Progress Notes (Signed)
[id number]  Received preop consult. Pt with many questions about recovery after surgery including exercise. Told pt about CRP 2 for after surgery and she is very interested as she is usually very active. Discussed importance of walking and using IS after surgery. Gave pt OHS booklet and told pt about preop video. She is going to watch it with her husband later. Pt wants to walk when the MD states it is OK. Slightly SOB just lying in bed on oxygen so I will await ambulation order. Will continue to follow.Luetta Nutting RN BSN

## 2013-03-12 NOTE — H&P (Signed)
Office Visit     Patient: Ariel Simpson, Ariel Simpson Account Number: [id number] Provider: Verdis Prime, MD  DOB: 11-12-1956 Age: 57 Y Sex: Female Date: 03/10/2013  Phone: [phone]   Address: [address], [place], [place]  Pcp: VYVYAN SUN          1. POST HOSPITAL/CHF.        HPI:  General:  The patient did well for 2 days after discharge but starting last night noted shortness of breath, cough, and orthopnea. She took an extra Lasix at within an hour she was able to relax and sleep. She notes that when her blood pressure goes below 100 systolic she feels fatigued and not able to participate in activity. She is in no acute episodes of shortness of breath. She has had a dry hacking cough since starting lisinopril..        ROS:  CARDIOLOGY:  no Chest tightness. no Claudication. no Cyanosis. no Dyspnea on exertion. no Edema. no Fatigue. no Irregular heart beat. no Murmurs. no Near Syncope. no Orthopnea. no Palpitations. no PND (paroxsymal nocturnal dyspnea). Signs of GI bleeding None. Snoring and/or insomnia None. Syncope no. Transient neurological symptoms None. Visual changes none.         Medical History: Palpitations, High blood pressure, MVP with severe mitral regurgitation and a flail posterior leaflet documented by echo and left ventriculography. No coronary disease. April 2014, Hyperlipidemia.        Social History:  General:  History of smoking  cigarettes: Never smoked Alcohol: daily: wine.  Caffeine: 1 serving daily.  Recreational drug use: no.  Exercise: 2-3 times per week.  Occupation: Emergency planning/management officer.  Marital Status: first marriage ended in divorce, remarried in 2009.  Children: 3 (Carlo, Mickle Plumb).  Seat belt use: yes.  Native of [place], moved to [place] in 2009.       Medications: Taking Toprol XL 50 MG Tablet Extended Release 24 Hour 1 tablet Once a day, Taking Lipitor 20 MG Tablet 1 tablet Once a day, Taking Amiodarone HCl 200 MG Tablet 1 tablet twice a day,  Taking Aspirin 81 MG Tablet Chewable 1 tablet Once a day, Taking Furosemide 20 MG Tablet 1 tablet Once a day, Taking Lisinopril 5 MG Tablet 1 tablet Once a day, Medication List reviewed and reconciled with the patient       Allergies: N.K.D.A.          Vitals: Wt 170, Ht 65, BMI 28.29, Pulse sitting 72, BP sitting 104/76.       Examination:  Cardiology Exam:  GENERAL APPEARANCE: pleasant, NAD, comfortable.  HEENT: normal.  CAROTID UPSTROKE: no bruit, upstrokes intact.  JVD: A CV wave is noted but without significant JVD.Marland Kitchen  HEART: regular rate and rhythm, normal S1S2, no rub, no gallop, or click.  HEART MURMUR: grade 4/6, holosystolic murmur of MR. Murmur to be heard through to the back..  LUNGS: clear to auscultation, no wheezing/rhonchi/rales.  ABDOMEN: soft, non-tender, no hepatomegaly, no masses palpated.  EXTREMITIES: no leg edema.  PERIPHERAL PULSES: 2+, bilateral.  NEUROLOGIC: grossly intact, cranial nerves intact, gait WNL.  MOOD: normal.            Assessment:  1. Acute diastolic heart failure - 428.31 (Primary), Due to severe mitral regurgitation  2. Mitral valve disorder - 424.0, Severe mitral regurgitation due to choral rupture in a patient with mitral prolapse.  3. Hyperlipidemia - 272.4  4. Paroxysmal atrial fibrillation - 427.31        1. Acute diastolic  heart failure  Continue Toprol XL Tablet Extended Release 24 Hour, 50 MG, 1 tablet, Orally, Once a day ; Increase Furosemide Tablet, 20 MG, 1 tablet, Orally, twice a day ; Stop Lisinopril Tablet, 5 MG, 1 tablet, Orally, Once a day ; Continue Aspirin Tablet Chewable, 81 MG, 1 tablet, Orally, Once a day .       2. Mitral valve disorder  Clinical Notes: She is scheduled to have a transesophageal echo done on 03/12/2013. She will see Dr. Cornelius Moras tomorrow afternoon at 2 PM. I hope that we can have expedited surgery as she is relatively tenuous clinically.       3. Paroxysmal atrial fibrillation  Continue Amiodarone  HCl Tablet, 200 MG, 1 tablet, Orally, twice a day ; Decrease Toprol XL Tablet Extended Release 24 Hour, 50 MG, 1/2 tablet, Orally, Once a day .       4. Others  Start K-Dur Tablet, 20 meq, 1, Orally, daily, 30 days, 3, Refills 1 .        Imaging:    Imaging: EKG NSR     Simpson,Ariel 03/10/2013 12:25:27 PM >       Procedure Codes: [address] EKG I AND R       Follow Up: 1 Week (Reason: CHF)         Provider: Verdis Prime, MD  Patient: Ariel Simpson, Ariel Simpson DOB: 1956-01-28 Date: 03/10/2013

## 2013-03-12 NOTE — Progress Notes (Signed)
TCTS BRIEF PROGRESS NOTE   Mrs Ariel Simpson is well known to me from her recent office consultation.  Events of last night noted.  She reports feeling some better after receiving IV lasix, although she's still quite anxious.  We tentatively plan minimally invasive mitral valve repair and maze procedure next week on Tuesday 4/29.  Will obtain CTA while she is here.  OWEN,CLARENCE H 03/12/2013 6:11 PM

## 2013-03-12 NOTE — Progress Notes (Signed)
PFT completed. Unconfirmed copy placed in Progress Notes of [facility].

## 2013-03-12 NOTE — ED Provider Notes (Signed)
History     CSN: [id number]  Arrival date & time 03/12/13  8295   First MD Initiated Contact with Patient 03/12/13 [phone]      Chief Complaint  Patient presents with  . Shortness of Breath    (Consider location/radiation/quality/duration/timing/severity/associated sxs/prior treatment) Patient is a 57 y.o. female presenting with shortness of breath. The history is provided by the patient. No language interpreter was used.  Shortness of Breath Severity:  Moderate Onset quality:  Gradual Associated symptoms: cough   Associated symptoms: no chest pain, no diaphoresis and no fever   Pt is a 57yo female with hx of MVP and CHF presenting with SOB.  States she was admitted last week for CHF.  States while she was admitted, she was kept on O2 at night.  She believes this is needed for her to sleep well.  States when she tried to sleep last night she felt like she could not breath.  She is scheduled for MV replacement next Tuesday but not sure what she should do about her breathing until then. Pt states she called cardiologist office this morning who advised her to take extra dose of lasix (total of 60 in one day) states she has not peed yet.  States the best O2 they could get her at was 91.  Denied chest pain, leg swelling, leg edema, calf pain, visual distortions, tinnitus, facial pain/pressure/swelling, cough, congestion, neck pain, back pain, gi symptoms, urinary symptoms, numbness and paresthesias.   [facility]: Dr. Verdis Prime PCP: Evert Kohl  Past Medical History  Diagnosis Date  . Mitral valve prolapse   . High cholesterol   . Heart murmur   . Severe mitral regurgitation 03/03/2013    Acute on chronic with acute heart failure   . Mitral regurgitation   . Paroxysmal supraventricular tachycardia   . Paroxysmal atrial fibrillation 03/11/2013    Past Surgical History  Procedure Laterality Date  . Hysterotomy      No family history on file.  History  Substance Use Topics  .  Smoking status: Never Smoker   . Smokeless tobacco: Not on file  . Alcohol Use: No    OB History   Grav Para Term Preterm Abortions TAB SAB Ect Mult Living                  Review of Systems  Constitutional: Negative for fever, chills and diaphoresis.  Respiratory: Positive for cough and shortness of breath. Negative for chest tightness.   Cardiovascular: Negative for chest pain and palpitations.  Gastrointestinal: Negative for nausea.  All other systems reviewed and are negative.    Allergies  Review of patient's allergies indicates no known allergies.  Home Medications   Current Outpatient Rx  Name  Route  Sig  Dispense  Refill  . amiodarone (PACERONE) 200 MG tablet   Oral   Take 1 tablet (200 mg total) by mouth 2 (two) times daily.   60 tablet   1   . aspirin EC 81 MG EC tablet   Oral   Take 1 tablet (81 mg total) by mouth daily.         Marland Kitchen atorvastatin (LIPITOR) 20 MG tablet   Oral   Take 20 mg by mouth daily.         . furosemide (LASIX) 20 MG tablet   Oral   Take 20 mg by mouth 2 (two) times daily.         . metoprolol succinate (TOPROL-XL) 50 MG  24 hr tablet   Oral   Take 25 mg by mouth daily. Take with or immediately following a meal.         . potassium chloride SA (K-DUR,KLOR-CON) 20 MEQ tablet   Oral   Take 20 mEq by mouth daily.            BP 106/71  Pulse 68  Temp(Src) 98.6 F (37 C) (Oral)  Resp 16  SpO2 95%  Physical Exam  Nursing note and vitals reviewed. Constitutional: She appears well-developed and well-nourished. No distress.  Frail, fatigued appearing female sitting upright in bed with nasal cannula in place.  Pt able to speak in full sentences.  HENT:  Head: Normocephalic and atraumatic.  Eyes: Conjunctivae are normal. No scleral icterus.  Neck: Normal range of motion. Neck supple.  Cardiovascular: Normal rate and regular rhythm.   Murmur ( systolic over left sternal border, and left axial ) heard. Pulmonary/Chest:  Effort normal. No respiratory distress. She has no wheezes. She has rales ( bilateral lower lung fields). She exhibits no tenderness.  Abdominal: Soft. Bowel sounds are normal. She exhibits no distension. There is no tenderness.  Musculoskeletal: Normal range of motion. She exhibits no edema.  Neurological: She is alert.  Skin: Skin is warm and dry. She is not diaphoretic.    ED Course  Procedures (including critical care time)  Labs Reviewed  PRO B NATRIURETIC PEPTIDE - Abnormal; Notable for the following:    Pro B Natriuretic peptide (BNP) 1379.0 (*)    All other components within normal limits  CBC WITH DIFFERENTIAL - Abnormal; Notable for the following:    RBC 3.85 (*)    HCT 33.9 (*)    All other components within normal limits  COMPREHENSIVE METABOLIC PANEL - Abnormal; Notable for the following:    Glucose, Bld 104 (*)    Creatinine, Ser 1.12 (*)    ALT 39 (*)    GFR calc non Af Amer 53 (*)    GFR calc Af Amer 61 (*)    All other components within normal limits  POCT I-STAT TROPONIN I   Dg Chest 2 View  03/12/2013  *RADIOLOGY REPORT*  Clinical Data: Shortness of breath  CHEST - 2 VIEW  Comparison: 03/02/2013 and 03/03/2013  Findings: Cardiomegaly.  Central vascular congestion.  Mild infrahilar interstitial prominence.  Small effusions with associated airspace opacities.  No pneumothorax.  No interval osseous change.  IMPRESSION: Cardiomegaly with central vascular congestion and mild edema.  Small bilateral pleural effusions (right greater than left) with associated airspace opacities, favor atelectasis.  Similar to prior.   Original Report Authenticated By: Jearld Lesch, M.D.      1. Dyspnea       MDM  Pt is has a hx of CHF and MVP, c/o dyspnea.  Pt was admitted last week.  She is scheduled for MV replacement next Tuesday but unsure if she needs to be readmitted before then.  Pt was at 89% O2 upon arrival.  Now at 98% on O2 2L via nasal cannula.  Denies chest pain or  nausea at this time.    [facility]: Dr. Verdis Prime PCP: Evert Kohl  BNP: 1379, down from 1624 last week.   Troponin: neg EKG: non-stemi. Recorded in Dr. Read Drivers note   Discussed pt with Dr. Read Drivers.  He consulted Dr. Chales Abrahams who stated pt is already scheduled for TEE so he has agreed to procede with procedure and will determine whether pt needs to be  admitted or not.   Pt will be discharged from ED to echo cardiology suite at scheduled time.    Vitals: unremarkable. Discharged in stable condition.    Discussed pt with attending during ED encounter.         Junius Finner, PA-C 03/12/13 [phone]

## 2013-03-12 NOTE — ED Notes (Signed)
Patient sitting on stretcher. Patients o2 dropped down to 85%, increased patients o2 to 3L [place], brought patients oxygen level up to 91% at this time. Repositioned patient in bed and placed pillow behind back. Patient denies any pain and states she is not short of breath at this time. Call light at bedside. Husband in room with patient. Will continue to monitor.

## 2013-03-12 NOTE — ED Notes (Signed)
Patient sitting up on stretcher at this time. Patient denies any complaints. States when she drifts off to sleep she will wake up suddenly and feel like she cant catch her breath. Patients resp are even and unlabored. o2 is maintaining at 92-93% at this time. Husband at bedside. Call light on bed. Will continue to monitor.

## 2013-03-12 NOTE — ED Notes (Signed)
Patient began to desat to 89%, increased patient's oxygen up to 3L and then to 4L. Patient is now maintaining at 92%.

## 2013-03-12 NOTE — ED Notes (Signed)
Per md order, pt to be dc from ED. Can be moved to cdu and wait for echo that pt already had scheduled this am.

## 2013-03-12 NOTE — H&P (Signed)
Admit date: 03/12/2013 Primary Cardiologist  Dr. Katrinka Blazing Cardiothoracic surgery: Dr. Cornelius Moras  CC: Short of breath, orthopnea, severe mitral regurgitation  HPI: 57 year old female who yesterday saw Dr. Cornelius Moras in clinic in anticipation of mitral valve repair/replacement next Tuesday with severe mitral regurgitation, flail leaflet who presented to the emergency department late last night/early this morning with symptoms of orthopnea, shortness of breath, nervousness surrounding her condition. Her chest x-ray showed no significant change from prior. She had cardiomegaly with central vascular congestion and mild edema. I spoke personally with the ER physician taking care of her and at that time, he did not feel as though she needed to be admitted. She had planned endoscopy/TEE today at noon and in anticipation of this procedure, her O2 sats were noted to be 88% and she was having some dyspnea at rest. I was called to evaluate her prior to procedure and decided to cancel this elective procedure.  Her husband states that she has had some shortness of breath off-and-on since the diagnosis was made. She has not had any sleep at home. She is nervous about taking Ambien. She's not having any chest discomfort. She did have some audible crackles she states. No recent fevers, chills. No bleeding.  During her appointment with Dr. Cornelius Moras yesterday, they both explain to me that the transesophageal echocardiogram was not absolutely mandatory prior to his surgical procedure. She decided however to move forward with this.    PMH:   Past Medical History  Diagnosis Date  . Mitral valve prolapse   . High cholesterol   . Heart murmur   . Severe mitral regurgitation 03/03/2013    Acute on chronic with acute heart failure   . Mitral regurgitation   . Paroxysmal supraventricular tachycardia   . Paroxysmal atrial fibrillation 03/11/2013  . Shortness of breath   . PONV (postoperative nausea and vomiting)     Dizziness and Nausea   . CHF (congestive heart failure)     PSH:   Past Surgical History  Procedure Laterality Date  . Abdominal hysterectomy    . Cardiac catheterization    . Wisdom tooth extraction    . Vericose veins     Allergies:  Review of patient's allergies indicates no known allergies. Prior to Admit Meds:   Prescriptions prior to admission  Medication Sig Dispense Refill  . amiodarone (PACERONE) 200 MG tablet Take 1 tablet (200 mg total) by mouth 2 (two) times daily.  60 tablet  1  . aspirin EC 81 MG EC tablet Take 1 tablet (81 mg total) by mouth daily.      Marland Kitchen atorvastatin (LIPITOR) 20 MG tablet Take 20 mg by mouth daily.      . metoprolol succinate (TOPROL-XL) 50 MG 24 hr tablet Take 25 mg by mouth daily. Take with or immediately following a meal.      . potassium chloride SA (K-DUR,KLOR-CON) 20 MEQ tablet Take 20 mEq by mouth daily.       . [DISCONTINUED] furosemide (LASIX) 20 MG tablet Take 20 mg by mouth 2 (two) times daily.       Fam HX:   History reviewed. No pertinent family history. Social HX:    History   Social History  . Marital Status: Married    Spouse Name: N/A    Number of Children: N/A  . Years of Education: N/A   Occupational History  . Not on file.   Social History Main Topics  . Smoking status: Never Smoker   . Smokeless  tobacco: Not on file  . Alcohol Use: No  . Drug Use: No  . Sexually Active: Not on file   Other Topics Concern  . Not on file   Social History Narrative  . No narrative on file     ROS:  All 11 ROS were addressed and are negative except what is stated in the HPI  Physical Exam: Blood pressure 126/75, pulse 68, temperature 98.2 F (36.8 C), temperature source Oral, resp. rate 14, SpO2 92.00%.    General: Well developed, well nourished, in mild distress, mildly anxious appearing. Head: Eyes PERRLA, No xanthomas.   Normal cephalic and atramatic  Lungs:   Mild crackles heard at bases bilaterally, no wheezes, mildly appearing increased  respiratory effort. Heart:   HRRR S1 S2 Pulses are 2+ & equal. 3/6 holosystolic murmur at apex.  No carotid bruit. JVD to mid neck.  No abdominal bruits. Abdomen: Bowel sounds are positive, abdomen soft and non-tender without masses. No hepatosplenomegaly. Msk:  Back normal. Normal strength and tone for age. Extremities:   Trace edema bilateral lower extremities.  DP +1 Neuro: Alert and oriented X 3, non-focal, MAE x 4 GU: Deferred Rectal: Deferred Psych:  Good affect, responds appropriately, mildly anxious    Labs:   Lab Results  Component Value Date   WBC 7.0 03/12/2013   HGB 12.0 03/12/2013   HCT 33.9* 03/12/2013   MCV 88.1 03/12/2013   PLT 279 03/12/2013    Recent Labs Lab 03/12/13 0404  NA 141  K 4.6  CL 105  CO2 25  BUN 18  CREATININE 1.12*  CALCIUM 9.5  PROT 7.0  BILITOT 0.7  ALKPHOS 96  ALT 39*  AST 36  GLUCOSE 104*   No results found for this basename: PTT   Lab Results  Component Value Date   INR 1.09 03/03/2013   Lab Results  Component Value Date   TROPONINI <0.30 03/04/2013        Radiology:  Dg Chest 2 View  03/12/2013  *RADIOLOGY REPORT*  Clinical Data: Shortness of breath  CHEST - 2 VIEW  Comparison: 03/02/2013 and 03/03/2013  Findings: Cardiomegaly.  Central vascular congestion.  Mild infrahilar interstitial prominence.  Small effusions with associated airspace opacities.  No pneumothorax.  No interval osseous change.  IMPRESSION: Cardiomegaly with central vascular congestion and mild edema.  Small bilateral pleural effusions (right greater than left) with associated airspace opacities, favor atelectasis.  Similar to prior.   Original Report Authenticated By: Jearld Lesch, M.D.    Personally viewed.   EKG: Normal sinus rhythm rate 73 with nonspecific ST changes.  Personally viewed.  ASSESSMENT/PLAN:   57 year old with flail mitral valve leaflet, severe mitral regurgitation, with symptoms of acute worsening/dyspnea overnight.  - I discussed the  case with her, her husband, with Dr. Katrinka Blazing. Because of her mild hypoxia, mildly worsening symptoms overnight, I decided to cancel her elective transesophageal echocardiogram. I discussed with family. I did not feel comfortable pursuing conscious sedation with the increased risk for worsening respiratory status. Theyclearly explained to me that the transesophageal echocardiogram was not absolutely mandatory prior to surgery according to Dr. Orvan July description to them that could be helpful in some aspects for preparation for surgery.   -I have given her IV Lasix 40 mg x1. In discussion with Dr. Katrinka Blazing, she is quite sensitive to preload reduction and was hypotensive at times during her previous hospitalization. We will carefully observe. She is no longer on angiotensin receptor blocker. I have held  her Toprol XL 50 mg as well. She does have a history of SVT. We will monitor this on telemetry. I have given her amiodarone preoperatively as prescribed by Dr. Cornelius Moras. 200 mg twice a day.  -BNP was 1379. Troponin was normal this morning. Creatinine was 1.1. Hemoglobin was 12.0. Prior BNP on 03/03/13 was 1624. Prior hemoglobin was 13. TSH was normal at 2.0.  -We'll observe overnight with diuresis. I will contact Dr. Cornelius Moras. Surgery at this point was planned for next Tuesday.  Donato Schultz, MD  03/12/2013  12:05 PM

## 2013-03-12 NOTE — ED Provider Notes (Addendum)
Medical screening examination/treatment/procedure(s) were conducted as a shared visit with non-physician practitioner(s) and myself.  I personally evaluated the patient during the encounter  7:24 AM Patient's awake and alert in no acute respiratory distress. She is scheduled for a transesophageal echocardiogram this morning. I discussed her with Dr. Anne Fu will be performing the TEE. He will evaluate the patient to determine whether she will be admitted or discharged home.  EKG Interpretation:  Date & Time: 03/12/2013 3:39 AM  Rate: 73  Rhythm: normal sinus rhythm  QRS Axis: normal  Intervals: QT prolonged  ST/T Wave abnormalities: normal  Conduction Disutrbances:none  Narrative Interpretation:   Old EKG Reviewed: unchanged       Hanley Seamen, MD 03/12/13 1610  Carlisle Beers Avion Patella, MD 03/12/13 0730

## 2013-03-12 NOTE — ED Notes (Signed)
PT. REPORTS SOB WITH DRY COUGH ONSET THIS EVENING WORSE WHEN LYING , STATES HISTORY OF CHF.

## 2013-03-13 ENCOUNTER — Observation Stay (HOSPITAL_COMMUNITY): Payer: BC Managed Care – PPO

## 2013-03-13 ENCOUNTER — Other Ambulatory Visit (HOSPITAL_COMMUNITY): Payer: BC Managed Care – PPO

## 2013-03-13 DIAGNOSIS — Z01810 Encounter for preprocedural cardiovascular examination: Secondary | ICD-10-CM

## 2013-03-13 LAB — BASIC METABOLIC PANEL
Chloride: 99 mEq/L (ref 96–112)
Creatinine, Ser: 1.1 mg/dL (ref 0.50–1.10)
GFR calc Af Amer: 62 mL/min — ABNORMAL LOW (ref >90)
GFR calc non Af Amer: 54 mL/min — ABNORMAL LOW (ref >90)
Potassium: 3.8 mEq/L (ref 3.5–5.1)

## 2013-03-13 MED ORDER — IOHEXOL 350 MG/ML SOLN
100.0000 mL | Freq: Once | INTRAVENOUS | Status: AC | PRN
  Administered 2013-03-13: 100 mL via INTRAVENOUS

## 2013-03-13 MED ORDER — ZOLPIDEM TARTRATE 5 MG PO TABS
5.0000 mg | ORAL_TABLET | Freq: Every evening | ORAL | Status: DC | PRN
  Administered 2013-03-13 – 2013-03-17 (×5): 5 mg via ORAL
  Filled 2013-03-13 (×5): qty 1

## 2013-03-13 NOTE — Progress Notes (Signed)
TCTS BRIEF PROGRESS NOTE   Mrs Seiple has diuresed well and feels much better today.  Results of CT noted.  We plan minimally invasive mitral valve repair + maze on Tuesday.  All questions answered.  OWEN,CLARENCE H 03/13/2013 6:23 PM

## 2013-03-13 NOTE — Progress Notes (Addendum)
Patient Name: Ariel Simpson Date of Encounter: 03/13/2013    SUBJECTIVE: Breathing has improved after IV Lasix yesterday.Chales Abrahams meanwhile cessation not to proceed with TEE yesterday. The patient is symptomatic and has a very fine line between decompensation and heart failure and overdiuresis with hypotension. On top of this she is anxious about her upcoming treatments. She has contacted the on-call physician each night since being discharged from the hospital.  TELEMETRY:  Normal sinus rhythm without recurrent atrial fibrillation: Filed Vitals:   03/12/13 1651 03/12/13 1802 03/12/13 2117 03/13/13 0505  BP: 103/68  110/69 100/67  Pulse: 74  72 71  Temp:   98.8 F (37.1 C) 98 F (36.7 C)  TempSrc:   Oral Oral  Resp:   20 18  Height:  5\' 6"  (1.676 m)    Weight:  77.1 kg (169 lb 15.6 oz)    SpO2:   94% 97%    Intake/Output Summary (Last 24 hours) at 03/13/13 0905 Last data filed at 03/12/13 2200  Gross per 24 hour  Intake    600 ml  Output   1600 ml  Net  -1000 ml    LABS: Basic Metabolic Panel:  Recent Labs  62/13/08 0404 03/12/13 1338 03/13/13 0523  NA 141  --  137  K 4.6  --  3.8  CL 105  --  99  CO2 25  --  28  GLUCOSE 104*  --  97  BUN 18  --  19  CREATININE 1.12* 1.06 1.10  CALCIUM 9.5  --  9.4   CBC:  Recent Labs  03/12/13 0404 03/12/13 1338  WBC 7.0 5.6  NEUTROABS 4.2  --   HGB 12.0 12.6  HCT 33.9* 35.8*  MCV 88.1 88.0  PLT 279 290     Radiology/Studies:  *RADIOLOGY REPORT*  Clinical Data: Shortness of breath  CHEST - 2 VIEW  Comparison: 03/02/2013 and 03/03/2013  Findings: Cardiomegaly. Central vascular congestion. Mild  infrahilar interstitial prominence. Small effusions with  associated airspace opacities. No pneumothorax. No interval  osseous change.  IMPRESSION:  Cardiomegaly with central vascular congestion and mild edema.  Small bilateral pleural effusions (right greater than left) with  associated airspace opacities, favor  atelectasis. Similar to  prior.  Original Report Authenticated By: Jearld Lesch, M.D.   Physical Exam: Blood pressure 100/67, pulse 71, temperature 98 F (36.7 C), temperature source Oral, resp. rate 18, height 5\' 6"  (1.676 m), weight 77.1 kg (169 lb 15.6 oz), SpO2 97.00%. Weight change:    4-5/6 holosystolic murmur of mitral regurgitation. Soft S3 gallop is heard.  No peripheral edema.  Overall the patient is compensated this morning, comfortable, and calm.  Chest is clear to auscultation  ASSESSMENT:  1. 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 Recurrent acute diastolic heart failure now with bilateral pleural effusions and evidence of congestion on chest x-ray yesterday despite outpatient diuretic therapy  3. Recent episodes of paroxysmal atrial fibrillation  Plan:  1. Won't discharge the patient from the hospital. She will need to stay here until her valve is surgically treated  2. More aggressive diuretic therapy, watching her blood pressure.  Selinda Eon 03/13/2013, 9:05 AM

## 2013-03-13 NOTE — Progress Notes (Signed)
Pre-op Cardiac Surgery  Carotid Findings:  There is no obvious evidence of hemodynamically significant internal carotid artery stenosis >40%. Vertebral arteries are patent with antegrade flow.   Upper Extremity Right Left  Brachial Pressures 111-Biphasic 116-Triphasic  Radial Waveforms Biphasic Biphasic  Ulnar Waveforms Triphasic Biphasic  Palmar Arch (Allen's Test) Within normal limits Within normal limits    03/13/2013 12:08 PM Gertie Fey, RDMS, RDCS

## 2013-03-14 ENCOUNTER — Other Ambulatory Visit (HOSPITAL_COMMUNITY): Payer: BC Managed Care – PPO

## 2013-03-14 DIAGNOSIS — I059 Rheumatic mitral valve disease, unspecified: Secondary | ICD-10-CM

## 2013-03-14 LAB — BASIC METABOLIC PANEL
CO2: 27 mEq/L (ref 19–32)
Calcium: 9.5 mg/dL (ref 8.4–10.5)
GFR calc Af Amer: 60 mL/min — ABNORMAL LOW (ref >90)
GFR calc non Af Amer: 52 mL/min — ABNORMAL LOW (ref >90)
Sodium: 139 mEq/L (ref 135–145)

## 2013-03-14 NOTE — Progress Notes (Addendum)
Patient Name: Ariel Simpson Date of Encounter: 03/14/2013    SUBJECTIVE: No change over night. Has cough and dyspnea with activity.  TELEMETRY:  VS stable Filed Vitals:   03/13/13 1345 03/13/13 2210 03/14/13 0500 03/14/13 0604  BP: 97/67 109/72  111/71  Pulse: 75 69  70  Temp: 98.1 F (36.7 C) 98 F (36.7 C)  98.3 F (36.8 C)  TempSrc: Oral Oral  Oral  Resp: 20 18  18   Height:      Weight:   73.573 kg (162 lb 3.2 oz)   SpO2: 94% 96%  96%    Intake/Output Summary (Last 24 hours) at 03/14/13 0814 Last data filed at 03/13/13 2215  Gross per 24 hour  Intake    720 ml  Output   2300 ml  Net  -1580 ml    LABS: Basic Metabolic Panel:  Recent Labs  16/10/96 0523 03/14/13 0515  NA 137 139  K 3.8 3.5  CL 99 100  CO2 28 27  GLUCOSE 97 99  BUN 19 20  CREATININE 1.10 1.13*  CALCIUM 9.4 9.5   CBC:  Recent Labs  03/12/13 0404 03/12/13 1338  WBC 7.0 5.6  NEUTROABS 4.2  --   HGB 12.0 12.6  HCT 33.9* 35.8*  MCV 88.1 88.0  PLT 279 290     Radiology/Studies:  CT done  Physical Exam: Blood pressure 111/71, pulse 70, temperature 98.3 F (36.8 C), temperature source Oral, resp. rate 18, height 5\' 6"  (1.676 m), weight 73.573 kg (162 lb 3.2 oz), SpO2 96.00%. Weight change: -3.526 kg (-7 lb 12.4 oz)   5/6 holosystolic murmur MR Decreased basilar breath sounds.  ASSESSMENT:  1. Severe MR 2. Acute diastolic heart failure with failure of OP management 3. PAF  Plan:  1. Unfortunately, she has failed OP therapy and will remain in house until surgical therapy accomplished. She has very little margin and is easily volume depleted(hypotension) and overloaded(dyspnea/CHF). 2. Continue diuresis over weekend and watch renal function 3. Treat a fib aggressively as she becomes very symptomatic  Signed, [name] 03/14/2013, 8:14 AM

## 2013-03-14 NOTE — Progress Notes (Signed)
1030 Talked with Dr. Katrinka Blazing earlier about activity. Since pt is not to walk until after surgery and preop education has been done, we will follow up after surgery. Ariel Delira DunlapRNBSN

## 2013-03-14 NOTE — Progress Notes (Signed)
TCTS BRIEF PROGRESS NOTE   Clinically stable For OR on Tuesday 4/29 All questions answered, which required 30 minutes at the bedside  [facility] Doctor [facility] H 03/14/2013 5:42 PM

## 2013-03-15 DIAGNOSIS — I5031 Acute diastolic (congestive) heart failure: Secondary | ICD-10-CM

## 2013-03-15 LAB — BASIC METABOLIC PANEL
Calcium: 9.9 mg/dL (ref 8.4–10.5)
GFR calc Af Amer: 54 mL/min — ABNORMAL LOW (ref >90)
GFR calc non Af Amer: 47 mL/min — ABNORMAL LOW (ref >90)
Glucose, Bld: 96 mg/dL (ref 70–99)
Potassium: 4.5 mEq/L (ref 3.5–5.1)
Sodium: 138 mEq/L (ref 135–145)

## 2013-03-15 NOTE — Progress Notes (Signed)
Patient ID: Ariel Simpson, female   DOB: 05-24-1956, 57 y.o.   MRN: [id number]   Patient Name: Ariel Simpson Date of Encounter: 03/15/2013    SUBJECTIVE Patient just started being in the hospital. No shortness of breath but does have occasional palpitations. Maintaining sinus rhythm with some PACs.  CURRENT MEDS . amiodarone  200 mg Oral BID  . aspirin EC  81 mg Oral Daily  . atorvastatin  20 mg Oral Daily  . furosemide  40 mg Intravenous BID  . heparin  5,000 Units Subcutaneous Q8H  . polyethylene glycol  17 g Oral Daily  . potassium chloride SA  20 mEq Oral Daily  . sodium chloride  3 mL Intravenous Q12H    OBJECTIVE  Filed Vitals:   03/14/13 1730 03/14/13 2021 03/15/13 0409 03/15/13 0603  BP: 116/69 111/66 101/65   Pulse:  74 69   Temp:  98.4 F (36.9 C) 98.3 F (36.8 C)   TempSrc:  Oral Oral   Resp:  20 19   Height:      Weight:    161 lb (73.029 kg)  SpO2:  97% 96%     Intake/Output Summary (Last 24 hours) at 03/15/13 0916 Last data filed at 03/15/13 0808  Gross per 24 hour  Intake   1080 ml  Output   1901 ml  Net   -821 ml   Filed Weights   03/12/13 1802 03/14/13 0500 03/15/13 0603  Weight: 169 lb 15.6 oz (77.1 kg) 162 lb 3.2 oz (73.573 kg) 161 lb (73.029 kg)    PHYSICAL EXAM  General: Pleasant, NAD. Neuro: Alert and oriented X 3. Moves all extremities spontaneously. Psych: Normal affect. HEENT:  Normal  Neck: Supple without bruits or JVD. Lungs:  Resp regular and unlabored, CTA. Heart: RRR no s3, s4, MR murmur Abdomen: Soft, non-tender, non-distended, BS + x 4.  Extremities: No clubbing, cyanosis or edema. DP/PT/Radials 2+ and equal bilaterally.  Accessory Clinical Findings  CBC  Recent Labs  03/12/13 1338  WBC 5.6  HGB 12.6  HCT 35.8*  MCV 88.0  PLT 290   Basic Metabolic Panel  Recent Labs  03/14/13 0515 03/15/13 0540  NA 139 138  K 3.5 4.5  CL 100 98  CO2 27 30  GLUCOSE 99 96  BUN 20 20  CREATININE 1.13* 1.24*  CALCIUM 9.5  9.9   Liver Function Tests No results found for this basename: AST, ALT, ALKPHOS, BILITOT, PROT, ALBUMIN,  in the last 72 hours No results found for this basename: LIPASE, AMYLASE,  in the last 72 hours Cardiac Enzymes No results found for this basename: CKTOTAL, CKMB, CKMBINDEX, TROPONINI,  in the last 72 hours BNP No components found with this basename: POCBNP,  D-Dimer No results found for this basename: DDIMER,  in the last 72 hours Hemoglobin A1C No results found for this basename: HGBA1C,  in the last 72 hours Fasting Lipid Panel No results found for this basename: CHOL, HDL, LDLCALC, TRIG, CHOLHDL, LDLDIRECT,  in the last 72 hours Thyroid Function Tests No results found for this basename: TSH, T4TOTAL, FREET3, T3FREE, THYROIDAB,  in the last 72 hours  TELE  Normal sinus rhythm with premature beats  ECG    Radiology/Studies  Dg Chest 2 View  03/12/2013  *RADIOLOGY REPORT*  Clinical Data: Shortness of breath  CHEST - 2 VIEW  Comparison: 03/02/2013 and 03/03/2013  Findings: Cardiomegaly.  Central vascular congestion.  Mild infrahilar interstitial prominence.  Small effusions with associated airspace opacities.  No pneumothorax.  No interval osseous change.  IMPRESSION: Cardiomegaly with central vascular congestion and mild edema.  Small bilateral pleural effusions (right greater than left) with associated airspace opacities, favor atelectasis.  Similar to prior.   Original Report Authenticated By: Jearld Lesch, M.D.    Dg Chest 2 View  03/03/2013  *RADIOLOGY REPORT*  Clinical Data: Increased shortness of breath  CHEST - 2 VIEW  Comparison: 03/02/2013  Findings: Borderline enlargement of cardiac silhouette. Slight pulmonary vascular congestion. Mild accentuation of interstitial markings question pulmonary edema. Small bibasilar pleural effusions and atelectasis slightly greater on the right, unchanged. No pneumothorax or acute osseous findings.  IMPRESSION: Suspect minimal CHF,  with associated bibasilar effusions and atelectasis, little changed.   Original Report Authenticated By: Ulyses Southward, M.D.    Ct Angio Chest Aortic Dissect W &/or W/o  03/13/2013  *RADIOLOGY REPORT*  Clinical Data:  Mitral valve disease.  Evaluate for vascular pathology.  CT ANGIOGRAPHY CHEST, ABDOMEN AND PELVIS  Technique:  Multidetector CT imaging through the chest, abdomen and pelvis was performed using the standard protocol during bolus administration of intravenous contrast.  Multiplanar reconstructed images including MIPs were obtained and reviewed to evaluate the vascular anatomy.  Contrast: OMNIPAQUE IOHEXOL 350 MG/ML SOLN  Comparison:   None.  CTA CHEST  Findings:  The aorta is nonaneurysmal and patent within the chest. No evidence of aortic dissection or intramural hematoma.  Minimal vascular calcification in the aortic arch.  Great vessels are patent.  Thyroid gland is unremarkable.  Negative abnormal mediastinal adenopathy by measurement criteria. The left atrium is moderately dilated.  No significant aortic or mitral valve calcifications are seen.  Mildly prominent peribronchovascular soft tissue thickening in the hilar regions is present likely inflated to chronic inflammatory changes.  Very small bilateral pleural effusions right greater than left are present.  There is dependent atelectasis associated with the effusions. Tiny visceral pleural nodes on the major fissures. Nonspecific patchy ground-glass opacities are present in the right upper lobe on images 32 and 62.  No vertebral compression deformities.  Nonspecific sclerotic focus is present in the posterior and inferior T6 vertebral body.  No other sclerotic lesions are evident and this is of unknown significance.   Review of the MIP images confirms the above findings.  IMPRESSION: No acute pathology involving the aorta.  Specifically no evidence of aortic aneurysm, aortic dissection, or intramural hematoma.  Left atrium is dilated  consistent with mitral valvular disease.  Bilateral pleural effusions and bibasilar atelectasis.  Patchy ground-glass in the right upper lobe as described. Adenocarcinoma cannot be excluded.  Followup by CT is recommended in 12 months, with continued annual surveillance for a minimum of 3 years.  These recommendations are taken from "Recommendations for the Management of Subsolid Pulmonary Nodules Detected at CT:  A Statement from the Fleischner Society" Radiology 2013; 266:1, 304- 317.  Nonspecific solitary sclerotic focus in T6 as described.  CTA ABDOMEN AND PELVIS  Findings:  The aorta is nonaneurysmal and patent without evidence of dissection.  Minimal vascular calcification of the aorta.  The right renal artery has a a beaded appearance that is characteristic of fibromuscular dysplasia.  No evidence of narrowing at its origin.  The left renal artery is unaffected and within normal limits.  Celiac axis is patent.  Branch vessels are patent without evidence of vasculitis.  SMA is patent.  Branch vessels are grossly patent.  IMA is patent.  Branch vessels are grossly patent.  Mild tortuosity of the  iliac arteries.  Bilateral common, internal, and external iliac arteries are nonaneurysmal and patent.  Tiny benign-appearing cyst in the right lobe of the liver.  Spleen, adrenal glands, pancreas, kidneys, gallbladder are within normal limits.  Bladder is unremarkable.  Uterus is absent.  Adnexa are within normal limits.  No free fluid.  No abnormal adenopathy. Small left para-aortic lymph nodes adjacent to the left kidney.  No vertebral compression deformity.  Severe narrowing at L5 S1 with vacuum.  Lumbar facet arthropathy at L3-4, L4-5, and L5-S1.   Review of the MIP images confirms the above findings.  IMPRESSION: No evidence of aortic aneurysm or aortic dissection.  Findings compatible with fibromuscular dysplasia of the right renal artery.   Original Report Authenticated By: Jolaine Click, M.D.    Ct Angio  Abd/pel W/ And/or W/o  03/13/2013  *RADIOLOGY REPORT*  Clinical Data:  Mitral valve disease.  Evaluate for vascular pathology.  CT ANGIOGRAPHY CHEST, ABDOMEN AND PELVIS  Technique:  Multidetector CT imaging through the chest, abdomen and pelvis was performed using the standard protocol during bolus administration of intravenous contrast.  Multiplanar reconstructed images including MIPs were obtained and reviewed to evaluate the vascular anatomy.  Contrast: OMNIPAQUE IOHEXOL 350 MG/ML SOLN  Comparison:   None.  CTA CHEST  Findings:  The aorta is nonaneurysmal and patent within the chest. No evidence of aortic dissection or intramural hematoma.  Minimal vascular calcification in the aortic arch.  Great vessels are patent.  Thyroid gland is unremarkable.  Negative abnormal mediastinal adenopathy by measurement criteria. The left atrium is moderately dilated.  No significant aortic or mitral valve calcifications are seen.  Mildly prominent peribronchovascular soft tissue thickening in the hilar regions is present likely inflated to chronic inflammatory changes.  Very small bilateral pleural effusions right greater than left are present.  There is dependent atelectasis associated with the effusions. Tiny visceral pleural nodes on the major fissures. Nonspecific patchy ground-glass opacities are present in the right upper lobe on images 32 and 62.  No vertebral compression deformities.  Nonspecific sclerotic focus is present in the posterior and inferior T6 vertebral body.  No other sclerotic lesions are evident and this is of unknown significance.   Review of the MIP images confirms the above findings.  IMPRESSION: No acute pathology involving the aorta.  Specifically no evidence of aortic aneurysm, aortic dissection, or intramural hematoma.  Left atrium is dilated consistent with mitral valvular disease.  Bilateral pleural effusions and bibasilar atelectasis.  Patchy ground-glass in the right upper lobe as  described. Adenocarcinoma cannot be excluded.  Followup by CT is recommended in 12 months, with continued annual surveillance for a minimum of 3 years.  These recommendations are taken from "Recommendations for the Management of Subsolid Pulmonary Nodules Detected at CT:  A Statement from the Fleischner Society" Radiology 2013; 266:1, 304- 317.  Nonspecific solitary sclerotic focus in T6 as described.  CTA ABDOMEN AND PELVIS  Findings:  The aorta is nonaneurysmal and patent without evidence of dissection.  Minimal vascular calcification of the aorta.  The right renal artery has a a beaded appearance that is characteristic of fibromuscular dysplasia.  No evidence of narrowing at its origin.  The left renal artery is unaffected and within normal limits.  Celiac axis is patent.  Branch vessels are patent without evidence of vasculitis.  SMA is patent.  Branch vessels are grossly patent.  IMA is patent.  Branch vessels are grossly patent.  Mild tortuosity of the iliac arteries.  Bilateral common, internal, and external iliac arteries are nonaneurysmal and patent.  Tiny benign-appearing cyst in the right lobe of the liver.  Spleen, adrenal glands, pancreas, kidneys, gallbladder are within normal limits.  Bladder is unremarkable.  Uterus is absent.  Adnexa are within normal limits.  No free fluid.  No abnormal adenopathy. Small left para-aortic lymph nodes adjacent to the left kidney.  No vertebral compression deformity.  Severe narrowing at L5 S1 with vacuum.  Lumbar facet arthropathy at L3-4, L4-5, and L5-S1.   Review of the MIP images confirms the above findings.  IMPRESSION: No evidence of aortic aneurysm or aortic dissection.  Findings compatible with fibromuscular dysplasia of the right renal artery.   Original Report Authenticated By: Jolaine Click, M.D.     ASSESSMENT AND PLAN  Principal Problem:   Acute diastolic heart failure Active Problems:   Severe mitral regurgitation    Doing well. No change in  medications today. Creatinine is slightly increased. We'll check again in the morning. Surgery Tuesday. All questions answered. Signed, Valera Castle MD

## 2013-03-16 LAB — BASIC METABOLIC PANEL
Chloride: 98 mEq/L (ref 96–112)
GFR calc Af Amer: 59 mL/min — ABNORMAL LOW (ref >90)
GFR calc non Af Amer: 51 mL/min — ABNORMAL LOW (ref >90)
Potassium: 3.8 mEq/L (ref 3.5–5.1)
Sodium: 137 mEq/L (ref 135–145)

## 2013-03-16 LAB — SURGICAL PCR SCREEN
MRSA, PCR: NEGATIVE
Staphylococcus aureus: NEGATIVE

## 2013-03-16 NOTE — Progress Notes (Signed)
Patient ID: Ariel Simpson, female   DOB: 1956/09/09, 57 y.o.   MRN: [id number]   Patient Name: Ariel Simpson Date of Encounter: 03/16/2013    SUBJECTIVE  No complaints today.  CURRENT MEDS . amiodarone  200 mg Oral BID  . aspirin EC  81 mg Oral Daily  . atorvastatin  20 mg Oral Daily  . furosemide  40 mg Intravenous BID  . heparin  5,000 Units Subcutaneous Q8H  . polyethylene glycol  17 g Oral Daily  . potassium chloride SA  20 mEq Oral Daily  . sodium chloride  3 mL Intravenous Q12H    OBJECTIVE  Filed Vitals:   03/15/13 0603 03/15/13 1300 03/15/13 2045 03/16/13 0520  BP:  105/67 112/72 103/62  Pulse:  72 75 66  Temp:  98.2 F (36.8 C) 98.4 F (36.9 C) 97.6 F (36.4 C)  TempSrc:  Oral Oral Oral  Resp:  18 18 19   Height:      Weight: 161 lb (73.029 kg)   161 lb 11.2 oz (73.347 kg)  SpO2:  95% 98% 93%    Intake/Output Summary (Last 24 hours) at 03/16/13 0854 Last data filed at 03/15/13 2016  Gross per 24 hour  Intake    240 ml  Output   1400 ml  Net  -1160 ml   Filed Weights   03/14/13 0500 03/15/13 0603 03/16/13 0520  Weight: 162 lb 3.2 oz (73.573 kg) 161 lb (73.029 kg) 161 lb 11.2 oz (73.347 kg)    PHYSICAL EXAM  General: Pleasant, NAD. Neuro: Alert and oriented X 3. Moves all extremities spontaneously. Psych: Normal affect. HEENT:  Normal  Neck: Supple without bruits or JVD. Lungs:  Resp regular and unlabored, CTA. Heart: RRR no s3, s4,  MR murmur Abdomen: Soft, non-tender, non-distended, BS + x 4.  Extremities: No clubbing, cyanosis or edema. DP/PT/Radials 2+ and equal bilaterally.  Accessory Clinical Findings  CBC No results found for this basename: WBC, NEUTROABS, HGB, HCT, MCV, PLT,  in the last 72 hours Basic Metabolic Panel  Recent Labs  03/15/13 0540 03/16/13 0532  NA 138 137  K 4.5 3.8  CL 98 98  CO2 30 28  GLUCOSE 96 93  BUN 20 20  CREATININE 1.24* 1.15*  CALCIUM 9.9 9.6   Liver Function Tests No results found for this  basename: AST, ALT, ALKPHOS, BILITOT, PROT, ALBUMIN,  in the last 72 hours No results found for this basename: LIPASE, AMYLASE,  in the last 72 hours Cardiac Enzymes No results found for this basename: CKTOTAL, CKMB, CKMBINDEX, TROPONINI,  in the last 72 hours BNP No components found with this basename: POCBNP,  D-Dimer No results found for this basename: DDIMER,  in the last 72 hours Hemoglobin A1C No results found for this basename: HGBA1C,  in the last 72 hours Fasting Lipid Panel No results found for this basename: CHOL, HDL, LDLCALC, TRIG, CHOLHDL, LDLDIRECT,  in the last 72 hours Thyroid Function Tests No results found for this basename: TSH, T4TOTAL, FREET3, T3FREE, THYROIDAB,  in the last 72 hours  TELE    ECG    Radiology/Studies  Dg Chest 2 View  03/12/2013  *RADIOLOGY REPORT*  Clinical Data: Shortness of breath  CHEST - 2 VIEW  Comparison: 03/02/2013 and 03/03/2013  Findings: Cardiomegaly.  Central vascular congestion.  Mild infrahilar interstitial prominence.  Small effusions with associated airspace opacities.  No pneumothorax.  No interval osseous change.  IMPRESSION: Cardiomegaly with central vascular congestion and mild edema.  Small  bilateral pleural effusions (right greater than left) with associated airspace opacities, favor atelectasis.  Similar to prior.   Original Report Authenticated By: Jearld Lesch, M.D.    Dg Chest 2 View  03/03/2013  *RADIOLOGY REPORT*  Clinical Data: Increased shortness of breath  CHEST - 2 VIEW  Comparison: 03/02/2013  Findings: Borderline enlargement of cardiac silhouette. Slight pulmonary vascular congestion. Mild accentuation of interstitial markings question pulmonary edema. Small bibasilar pleural effusions and atelectasis slightly greater on the right, unchanged. No pneumothorax or acute osseous findings.  IMPRESSION: Suspect minimal CHF, with associated bibasilar effusions and atelectasis, little changed.   Original Report  Authenticated By: Ulyses Southward, M.D.    Ct Angio Chest Aortic Dissect W &/or W/o  03/13/2013  *RADIOLOGY REPORT*  Clinical Data:  Mitral valve disease.  Evaluate for vascular pathology.  CT ANGIOGRAPHY CHEST, ABDOMEN AND PELVIS  Technique:  Multidetector CT imaging through the chest, abdomen and pelvis was performed using the standard protocol during bolus administration of intravenous contrast.  Multiplanar reconstructed images including MIPs were obtained and reviewed to evaluate the vascular anatomy.  Contrast: OMNIPAQUE IOHEXOL 350 MG/ML SOLN  Comparison:   None.  CTA CHEST  Findings:  The aorta is nonaneurysmal and patent within the chest. No evidence of aortic dissection or intramural hematoma.  Minimal vascular calcification in the aortic arch.  Great vessels are patent.  Thyroid gland is unremarkable.  Negative abnormal mediastinal adenopathy by measurement criteria. The left atrium is moderately dilated.  No significant aortic or mitral valve calcifications are seen.  Mildly prominent peribronchovascular soft tissue thickening in the hilar regions is present likely inflated to chronic inflammatory changes.  Very small bilateral pleural effusions right greater than left are present.  There is dependent atelectasis associated with the effusions. Tiny visceral pleural nodes on the major fissures. Nonspecific patchy ground-glass opacities are present in the right upper lobe on images 32 and 62.  No vertebral compression deformities.  Nonspecific sclerotic focus is present in the posterior and inferior T6 vertebral body.  No other sclerotic lesions are evident and this is of unknown significance.   Review of the MIP images confirms the above findings.  IMPRESSION: No acute pathology involving the aorta.  Specifically no evidence of aortic aneurysm, aortic dissection, or intramural hematoma.  Left atrium is dilated consistent with mitral valvular disease.  Bilateral pleural effusions and bibasilar  atelectasis.  Patchy ground-glass in the right upper lobe as described. Adenocarcinoma cannot be excluded.  Followup by CT is recommended in 12 months, with continued annual surveillance for a minimum of 3 years.  These recommendations are taken from "Recommendations for the Management of Subsolid Pulmonary Nodules Detected at CT:  A Statement from the Fleischner Society" Radiology 2013; 266:1, 304- 317.  Nonspecific solitary sclerotic focus in T6 as described.  CTA ABDOMEN AND PELVIS  Findings:  The aorta is nonaneurysmal and patent without evidence of dissection.  Minimal vascular calcification of the aorta.  The right renal artery has a a beaded appearance that is characteristic of fibromuscular dysplasia.  No evidence of narrowing at its origin.  The left renal artery is unaffected and within normal limits.  Celiac axis is patent.  Branch vessels are patent without evidence of vasculitis.  SMA is patent.  Branch vessels are grossly patent.  IMA is patent.  Branch vessels are grossly patent.  Mild tortuosity of the iliac arteries.  Bilateral common, internal, and external iliac arteries are nonaneurysmal and patent.  Tiny benign-appearing cyst in  the right lobe of the liver.  Spleen, adrenal glands, pancreas, kidneys, gallbladder are within normal limits.  Bladder is unremarkable.  Uterus is absent.  Adnexa are within normal limits.  No free fluid.  No abnormal adenopathy. Small left para-aortic lymph nodes adjacent to the left kidney.  No vertebral compression deformity.  Severe narrowing at L5 S1 with vacuum.  Lumbar facet arthropathy at L3-4, L4-5, and L5-S1.   Review of the MIP images confirms the above findings.  IMPRESSION: No evidence of aortic aneurysm or aortic dissection.  Findings compatible with fibromuscular dysplasia of the right renal artery.   Original Report Authenticated By: Jolaine Click, M.D.    Ct Angio Abd/pel W/ And/or W/o  03/13/2013  *RADIOLOGY REPORT*  Clinical Data:  Mitral valve  disease.  Evaluate for vascular pathology.  CT ANGIOGRAPHY CHEST, ABDOMEN AND PELVIS  Technique:  Multidetector CT imaging through the chest, abdomen and pelvis was performed using the standard protocol during bolus administration of intravenous contrast.  Multiplanar reconstructed images including MIPs were obtained and reviewed to evaluate the vascular anatomy.  Contrast: OMNIPAQUE IOHEXOL 350 MG/ML SOLN  Comparison:   None.  CTA CHEST  Findings:  The aorta is nonaneurysmal and patent within the chest. No evidence of aortic dissection or intramural hematoma.  Minimal vascular calcification in the aortic arch.  Great vessels are patent.  Thyroid gland is unremarkable.  Negative abnormal mediastinal adenopathy by measurement criteria. The left atrium is moderately dilated.  No significant aortic or mitral valve calcifications are seen.  Mildly prominent peribronchovascular soft tissue thickening in the hilar regions is present likely inflated to chronic inflammatory changes.  Very small bilateral pleural effusions right greater than left are present.  There is dependent atelectasis associated with the effusions. Tiny visceral pleural nodes on the major fissures. Nonspecific patchy ground-glass opacities are present in the right upper lobe on images 32 and 62.  No vertebral compression deformities.  Nonspecific sclerotic focus is present in the posterior and inferior T6 vertebral body.  No other sclerotic lesions are evident and this is of unknown significance.   Review of the MIP images confirms the above findings.  IMPRESSION: No acute pathology involving the aorta.  Specifically no evidence of aortic aneurysm, aortic dissection, or intramural hematoma.  Left atrium is dilated consistent with mitral valvular disease.  Bilateral pleural effusions and bibasilar atelectasis.  Patchy ground-glass in the right upper lobe as described. Adenocarcinoma cannot be excluded.  Followup by CT is recommended in 12 months,  with continued annual surveillance for a minimum of 3 years.  These recommendations are taken from "Recommendations for the Management of Subsolid Pulmonary Nodules Detected at CT:  A Statement from the Fleischner Society" Radiology 2013; 266:1, 304- 317.  Nonspecific solitary sclerotic focus in T6 as described.  CTA ABDOMEN AND PELVIS  Findings:  The aorta is nonaneurysmal and patent without evidence of dissection.  Minimal vascular calcification of the aorta.  The right renal artery has a a beaded appearance that is characteristic of fibromuscular dysplasia.  No evidence of narrowing at its origin.  The left renal artery is unaffected and within normal limits.  Celiac axis is patent.  Branch vessels are patent without evidence of vasculitis.  SMA is patent.  Branch vessels are grossly patent.  IMA is patent.  Branch vessels are grossly patent.  Mild tortuosity of the iliac arteries.  Bilateral common, internal, and external iliac arteries are nonaneurysmal and patent.  Tiny benign-appearing cyst in the right lobe  of the liver.  Spleen, adrenal glands, pancreas, kidneys, gallbladder are within normal limits.  Bladder is unremarkable.  Uterus is absent.  Adnexa are within normal limits.  No free fluid.  No abnormal adenopathy. Small left para-aortic lymph nodes adjacent to the left kidney.  No vertebral compression deformity.  Severe narrowing at L5 S1 with vacuum.  Lumbar facet arthropathy at L3-4, L4-5, and L5-S1.   Review of the MIP images confirms the above findings.  IMPRESSION: No evidence of aortic aneurysm or aortic dissection.  Findings compatible with fibromuscular dysplasia of the right renal artery.   Original Report Authenticated By: Jolaine Click, M.D.     ASSESSMENT AND PLAN  Principal Problem:   Acute diastolic heart failure Active Problems:   Severe mitral regurgitation    Stable. Good diuresis. Creatinine improved. No changes today. Surgery scheduled for Tuesday.  Signed, Valera Castle  MD

## 2013-03-17 LAB — URINALYSIS, ROUTINE W REFLEX MICROSCOPIC
Glucose, UA: NEGATIVE mg/dL
Ketones, ur: NEGATIVE mg/dL
Nitrite: NEGATIVE
Protein, ur: NEGATIVE mg/dL

## 2013-03-17 LAB — URINE MICROSCOPIC-ADD ON

## 2013-03-17 LAB — ABO/RH: ABO/RH(D): A POS

## 2013-03-17 MED ORDER — VANCOMYCIN HCL 1000 MG IV SOLR
INTRAVENOUS | Status: AC
  Administered 2013-03-18: 08:00:00
  Filled 2013-03-17: qty 1000

## 2013-03-17 MED ORDER — DEXMEDETOMIDINE HCL IN NACL 400 MCG/100ML IV SOLN
0.1000 ug/kg/h | INTRAVENOUS | Status: AC
  Administered 2013-03-18: 0.2 ug/kg/h via INTRAVENOUS
  Filled 2013-03-17: qty 100

## 2013-03-17 MED ORDER — CHLORHEXIDINE GLUCONATE 4 % EX LIQD
60.0000 mL | Freq: Once | CUTANEOUS | Status: AC
  Administered 2013-03-17: 4 via TOPICAL
  Filled 2013-03-17: qty 60

## 2013-03-17 MED ORDER — PHENYLEPHRINE HCL 10 MG/ML IJ SOLN
30.0000 ug/min | INTRAVENOUS | Status: DC
  Filled 2013-03-17: qty 2

## 2013-03-17 MED ORDER — MAGNESIUM SULFATE 50 % IJ SOLN
40.0000 meq | INTRAMUSCULAR | Status: DC
  Filled 2013-03-17: qty 10

## 2013-03-17 MED ORDER — VANCOMYCIN HCL 10 G IV SOLR
1250.0000 mg | INTRAVENOUS | Status: AC
  Administered 2013-03-18: 1250 mg via INTRAVENOUS
  Filled 2013-03-17: qty 1250

## 2013-03-17 MED ORDER — NITROGLYCERIN IN D5W 200-5 MCG/ML-% IV SOLN
2.0000 ug/min | INTRAVENOUS | Status: DC
  Filled 2013-03-17: qty 250

## 2013-03-17 MED ORDER — POTASSIUM CHLORIDE 2 MEQ/ML IV SOLN
80.0000 meq | INTRAVENOUS | Status: DC
  Filled 2013-03-17: qty 40

## 2013-03-17 MED ORDER — DEXTROSE 5 % IV SOLN
750.0000 mg | INTRAVENOUS | Status: DC
  Filled 2013-03-17: qty 750

## 2013-03-17 MED ORDER — DOPAMINE-DEXTROSE 3.2-5 MG/ML-% IV SOLN
2.0000 ug/kg/min | INTRAVENOUS | Status: DC
  Filled 2013-03-17: qty 250

## 2013-03-17 MED ORDER — METOPROLOL TARTRATE 12.5 MG HALF TABLET
12.5000 mg | ORAL_TABLET | Freq: Once | ORAL | Status: AC
  Administered 2013-03-18: 12.5 mg via ORAL
  Filled 2013-03-17: qty 1

## 2013-03-17 MED ORDER — DEXTROSE 5 % IV SOLN
1.5000 g | INTRAVENOUS | Status: AC
  Administered 2013-03-18: .75 g via INTRAVENOUS
  Administered 2013-03-18: 1.5 g via INTRAVENOUS
  Filled 2013-03-17: qty 1.5

## 2013-03-17 MED ORDER — SODIUM CHLORIDE 0.9 % IV SOLN
INTRAVENOUS | Status: AC
  Administered 2013-03-18: 14 mL/h via INTRAVENOUS
  Filled 2013-03-17: qty 40

## 2013-03-17 MED ORDER — TEMAZEPAM 15 MG PO CAPS: 15.0000 mg | ORAL_CAPSULE | Freq: Once | ORAL | Status: AC | PRN

## 2013-03-17 MED ORDER — SODIUM CHLORIDE 0.9 % IV SOLN
INTRAVENOUS | Status: AC
  Administered 2013-03-18: 1.4 [IU]/h via INTRAVENOUS
  Filled 2013-03-17: qty 1

## 2013-03-17 MED ORDER — PLASMA-LYTE 148 IV SOLN
INTRAVENOUS | Status: AC
  Administered 2013-03-18: 08:00:00
  Filled 2013-03-17: qty 2.5

## 2013-03-17 MED ORDER — DEXTROSE 5 % IV SOLN
0.5000 ug/min | INTRAVENOUS | Status: DC
  Filled 2013-03-17: qty 4

## 2013-03-17 MED ORDER — CHLORHEXIDINE GLUCONATE 4 % EX LIQD
60.0000 mL | Freq: Once | CUTANEOUS | Status: AC
  Administered 2013-03-18: 4 via TOPICAL
  Filled 2013-03-17 (×2): qty 60

## 2013-03-17 MED ORDER — BISACODYL 5 MG PO TBEC
5.0000 mg | DELAYED_RELEASE_TABLET | Freq: Once | ORAL | Status: AC
  Administered 2013-03-17: 5 mg via ORAL
  Filled 2013-03-17: qty 1

## 2013-03-17 NOTE — Progress Notes (Signed)
Patient Name: Ariel Simpson Date of Encounter: 03/17/2013    SUBJECTIVE: Good weekend. No complaints. No chest pain or dyspnea.  TELEMETRY:  NSR Filed Vitals:   03/16/13 0953 03/16/13 1300 03/16/13 2039 03/17/13 0552  BP: 115/65 105/62 108/69 101/69  Pulse: 70 73 70 64  Temp:  97.8 F (36.6 C) 98.8 F (37.1 C) 98.3 F (36.8 C)  TempSrc:  Oral Oral Oral  Resp:  18 19 19   Height:      Weight:    73.12 kg (161 lb 3.2 oz)  SpO2:  97% 96% 95%    Intake/Output Summary (Last 24 hours) at 03/17/13 0938 Last data filed at 03/17/13 0553  Gross per 24 hour  Intake    720 ml  Output   2400 ml  Net  -1680 ml    LABS: Basic Metabolic Panel:  Recent Labs  16/10/96 0540 03/16/13 0532  NA 138 137  K 4.5 3.8  CL 98 98  CO2 30 28  GLUCOSE 96 93  BUN 20 20  CREATININE 1.24* 1.15*  CALCIUM 9.9 9.6     Radiology/Studies:  No new  Physical Exam: Blood pressure 101/69, pulse 64, temperature 98.3 F (36.8 C), temperature source Oral, resp. rate 19, height 5\' 6"  (1.676 m), weight 73.12 kg (161 lb 3.2 oz), SpO2 95.00%. Weight change: -0.227 kg (-8 oz)  Chest is clear 4/6 systolic MR murmur, unchanged  ASSESSMENT:  1. Stable ADHF, with over 5 L negative since admission 2. Severe MR 3. PAF, suppressed on amio   Plan:  1. Check BMET 2. Surgery in AM  Signed, [name] 03/17/2013, 9:38 AM

## 2013-03-17 NOTE — Progress Notes (Signed)
TCTS BRIEF PROGRESS NOTE   I have again reviewed the indications, risks and potential benefits of surgery with Mrs Bodin.  The likelihood of successful and durable valve repair has been discussed with particular reference to the findings of their recent echocardiogram.  Based upon these findings and previous experience, I have quoted them a greater than 90 percent likelihood of successful valve repair.  In the unlikely event that their valve cannot be successfully repaired, we discussed the possibility of replacing the mitral valve using a mechanical prosthesis with the attendant need for long-term anticoagulation versus the alternative of replacing it using a bioprosthetic tissue valve with its potential for late structural valve deterioration and failure, depending upon the patient's longevity.  The patient specifically requests that if the mitral valve must be replaced that it be done using a mechanical valve.  The patient understands and accepts all potential associated risks of surgery including but not limited to risk of death, stroke, myocardial infarction, congestive heart failure, respiratory failure, renal failure, bleeding requiring blood transfusion and/or reexploration, arrhythmia, heart block or bradycardia requiring permanent pacemaker, pneumonia, pleural effusion, wound infection, pulmonary embolus or other thromboembolic complication, chronic pain or other delayed complications related to valve repair or replacement.  All questions have been addressed.  For OR in am   Purcell Nails 03/17/2013 5:48 PM

## 2013-03-17 NOTE — Care Management Note (Unsigned)
    Page 1 of 1   03/17/2013     4:41:29 PM   CARE MANAGEMENT NOTE 03/17/2013  Patient:  Ariel Simpson, Ariel Simpson   Account Number:  [id number]  Date Initiated:  03/14/2013  Documentation initiated by:  Cornelia Walraven  Subjective/Objective Assessment:   PT ADM ON 03/12/13 WITH SOB, ORTHOPNEA, AND SEVERE MITRAL VALVE REGURGITATION ON 03/12/13.  PTA, PT INDEPENDENT, LIVES WITH SPOUSE.     Action/Plan:   WILL FOLLOW FOR HOME NEEDS AS PT PROGRESSES.  HUSBAND TO PROVIDE CARE AT DC.   Anticipated DC Date:  03/23/2013   Anticipated DC Plan:  HOME W HOME HEALTH SERVICES      DC Planning Services  CM consult      Choice offered to / List presented to:             Status of service:  In process, will continue to follow Medicare Important Message given?   (If response is "NO", the following Medicare IM given date fields will be blank) Date Medicare IM given:   Date Additional Medicare IM given:    Discharge Disposition:    Per UR Regulation:  Reviewed for med. necessity/level of care/duration of stay  If discussed at Long Length of Stay Meetings, dates discussed:    Comments:  03/17/13 Ariel Bradt,RN,BSN [phone] AVR SCHEDULED FOR 03/18/13.

## 2013-03-18 ENCOUNTER — Inpatient Hospital Stay (HOSPITAL_COMMUNITY): Payer: BC Managed Care – PPO

## 2013-03-18 ENCOUNTER — Encounter (HOSPITAL_COMMUNITY): Payer: Self-pay | Admitting: Anesthesiology

## 2013-03-18 ENCOUNTER — Encounter (HOSPITAL_COMMUNITY)
Admission: RE | Disposition: A | Discharge status: Home / Self Care | Payer: Self-pay | Source: Ambulatory Visit | Attending: Interventional Cardiology

## 2013-03-18 ENCOUNTER — Inpatient Hospital Stay (HOSPITAL_COMMUNITY): Payer: BC Managed Care – PPO | Admitting: Anesthesiology

## 2013-03-18 DIAGNOSIS — I4891 Unspecified atrial fibrillation: Secondary | ICD-10-CM

## 2013-03-18 DIAGNOSIS — Z8679 Personal history of other diseases of the circulatory system: Secondary | ICD-10-CM

## 2013-03-18 DIAGNOSIS — I059 Rheumatic mitral valve disease, unspecified: Secondary | ICD-10-CM

## 2013-03-18 DIAGNOSIS — Z9889 Other specified postprocedural states: Secondary | ICD-10-CM

## 2013-03-18 HISTORY — PX: INTRAOPERATIVE TRANSESOPHAGEAL ECHOCARDIOGRAM: SHX5062

## 2013-03-18 HISTORY — DX: Personal history of other diseases of the circulatory system: Z86.79

## 2013-03-18 HISTORY — PX: MAZE: SHX5063

## 2013-03-18 HISTORY — DX: Other specified postprocedural states: Z98.890

## 2013-03-18 HISTORY — PX: MITRAL VALVE REPAIR: SHX2039

## 2013-03-18 LAB — POCT I-STAT 3, ART BLOOD GAS (G3+)
Acid-Base Excess: 2 mmol/L (ref 0.0–2.0)
Acid-base deficit: 4 mmol/L — ABNORMAL HIGH (ref 0.0–2.0)
Bicarbonate: 21.4 mEq/L (ref 20.0–24.0)
O2 Saturation: 95 %
Patient temperature: 35.2
Patient temperature: 36.5
Patient temperature: 37
TCO2: 26 mmol/L (ref 0–100)
TCO2: 20 mmol/L (ref 0–100)
TCO2: 25 mmol/L (ref 0–100)
pCO2 arterial: 32.2 mmHg — ABNORMAL LOW (ref 35.0–45.0)
pCO2 arterial: 38.3 mmHg (ref 35.0–45.0)
pCO2 arterial: 33.4 mmHg — ABNORMAL LOW (ref 35.0–45.0)
pCO2 arterial: 38.3 mmHg (ref 35.0–45.0)
pH, Arterial: 7.35 (ref 7.350–7.450)
pH, Arterial: 7.396 (ref 7.350–7.450)
pH, Arterial: 7.364 (ref 7.350–7.450)
pH, Arterial: 7.401 (ref 7.350–7.450)
pO2, Arterial: 442 mmHg — ABNORMAL HIGH (ref 80.0–100.0)
pO2, Arterial: 47 mmHg — ABNORMAL LOW (ref 80.0–100.0)

## 2013-03-18 LAB — POCT I-STAT 4, (NA,K, GLUC, HGB,HCT)
Glucose, Bld: 108 mg/dL — ABNORMAL HIGH (ref 70–99)
Glucose, Bld: 117 mg/dL — ABNORMAL HIGH (ref 70–99)
Glucose, Bld: 178 mg/dL — ABNORMAL HIGH (ref 70–99)
Glucose, Bld: 93 mg/dL (ref 70–99)
HCT: 25 % — ABNORMAL LOW (ref 36.0–46.0)
HCT: 26 % — ABNORMAL LOW (ref 36.0–46.0)
HCT: 30 % — ABNORMAL LOW (ref 36.0–46.0)
Hemoglobin: 8.5 g/dL — ABNORMAL LOW (ref 12.0–15.0)
Hemoglobin: 8.5 g/dL — ABNORMAL LOW (ref 12.0–15.0)
Potassium: 3.9 mEq/L (ref 3.5–5.1)
Potassium: 3.9 mEq/L (ref 3.5–5.1)
Potassium: 4.2 mEq/L (ref 3.5–5.1)
Potassium: 4.9 mEq/L (ref 3.5–5.1)
Sodium: 137 mEq/L (ref 135–145)
Sodium: 137 mEq/L (ref 135–145)
Sodium: 139 mEq/L (ref 135–145)
Sodium: 133 mEq/L — ABNORMAL LOW (ref 135–145)
Sodium: 135 mEq/L (ref 135–145)
Sodium: 141 mEq/L (ref 135–145)

## 2013-03-18 LAB — CBC
HCT: 38.2 % (ref 36.0–46.0)
HCT: 28.2 % — ABNORMAL LOW (ref 36.0–46.0)
HCT: 28 % — ABNORMAL LOW (ref 36.0–46.0)
Hemoglobin: 10 g/dL — ABNORMAL LOW (ref 12.0–15.0)
MCH: 30.4 pg (ref 26.0–34.0)
MCV: 88 fL (ref 78.0–100.0)
MCV: 86.8 fL (ref 78.0–100.0)
MCV: 87 fL (ref 78.0–100.0)
Platelets: 305 10*3/uL (ref 150–400)
RBC: 4.34 MIL/uL (ref 3.87–5.11)
RBC: 3.25 MIL/uL — ABNORMAL LOW (ref 3.87–5.11)
RBC: 3.22 MIL/uL — ABNORMAL LOW (ref 3.87–5.11)
RDW: 13.2 % (ref 11.5–15.5)
RDW: 13.4 % (ref 11.5–15.5)
WBC: 11.9 10*3/uL — ABNORMAL HIGH (ref 4.0–10.5)
WBC: 14.8 10*3/uL — ABNORMAL HIGH (ref 4.0–10.5)

## 2013-03-18 LAB — GLUCOSE, CAPILLARY
Glucose-Capillary: 59 mg/dL — ABNORMAL LOW (ref 70–99)
Glucose-Capillary: 92 mg/dL (ref 70–99)

## 2013-03-18 LAB — BLOOD GAS, ARTERIAL
Acid-Base Excess: 2.3 mmol/L — ABNORMAL HIGH (ref 0.0–2.0)
Drawn by: 331761
O2 Saturation: 96.6 %
TCO2: 27.1 mmol/L (ref 0–100)
pCO2 arterial: 37.6 mmHg (ref 35.0–45.0)
pO2, Arterial: 72.3 mmHg — ABNORMAL LOW (ref 80.0–100.0)

## 2013-03-18 LAB — PROTIME-INR: INR: 1.56 — ABNORMAL HIGH (ref 0.00–1.49)

## 2013-03-18 LAB — HEMOGLOBIN A1C: Hgb A1c MFr Bld: 5.5 % (ref <5.7)

## 2013-03-18 LAB — HEMOGLOBIN AND HEMATOCRIT, BLOOD: Hemoglobin: 8.6 g/dL — ABNORMAL LOW (ref 12.0–15.0)

## 2013-03-18 LAB — URINE CULTURE
Colony Count: NO GROWTH
Culture: NO GROWTH

## 2013-03-18 LAB — BASIC METABOLIC PANEL
BUN: 19 mg/dL (ref 6–23)
CO2: 29 mEq/L (ref 19–32)
Calcium: 10.2 mg/dL (ref 8.4–10.5)
Creatinine, Ser: 1.25 mg/dL — ABNORMAL HIGH (ref 0.50–1.10)

## 2013-03-18 LAB — APTT
aPTT: 37 seconds (ref 24–37)
aPTT: 37 seconds (ref 24–37)

## 2013-03-18 LAB — MAGNESIUM: Magnesium: 3.4 mg/dL — ABNORMAL HIGH (ref 1.5–2.5)

## 2013-03-18 LAB — CREATININE, SERUM
GFR calc Af Amer: 80 mL/min — ABNORMAL LOW (ref >90)
GFR calc non Af Amer: 69 mL/min — ABNORMAL LOW (ref >90)

## 2013-03-18 LAB — PREPARE RBC (CROSSMATCH)

## 2013-03-18 SURGERY — REPAIR, MITRAL VALVE, MINIMALLY INVASIVE
Anesthesia: General | Site: Chest | Laterality: Right | Wound class: Clean

## 2013-03-18 MED ORDER — PANTOPRAZOLE SODIUM 40 MG PO TBEC
40.0000 mg | DELAYED_RELEASE_TABLET | Freq: Every day | ORAL | Status: DC
  Administered 2013-03-20 – 2013-03-23 (×4): 40 mg via ORAL
  Filled 2013-03-18 (×4): qty 1

## 2013-03-18 MED ORDER — DEXMEDETOMIDINE HCL IN NACL 200 MCG/50ML IV SOLN
0.1000 ug/kg/h | INTRAVENOUS | Status: DC
  Administered 2013-03-18: 0.4 ug/kg/h via INTRAVENOUS
  Filled 2013-03-18: qty 50

## 2013-03-18 MED ORDER — MORPHINE SULFATE 2 MG/ML IJ SOLN
2.0000 mg | INTRAMUSCULAR | Status: DC | PRN
  Administered 2013-03-19 (×2): 2 mg via INTRAVENOUS
  Filled 2013-03-18 (×2): qty 1

## 2013-03-18 MED ORDER — VECURONIUM BROMIDE 10 MG IV SOLR
INTRAVENOUS | Status: DC | PRN
  Administered 2013-03-18 (×2): 5 mg via INTRAVENOUS

## 2013-03-18 MED ORDER — SODIUM CHLORIDE 0.45 % IV SOLN
INTRAVENOUS | Status: DC
  Administered 2013-03-18: 16:00:00 via INTRAVENOUS

## 2013-03-18 MED ORDER — PROTAMINE SULFATE 10 MG/ML IV SOLN
INTRAVENOUS | Status: DC | PRN
  Administered 2013-03-18: 230 mg via INTRAVENOUS

## 2013-03-18 MED ORDER — LACTATED RINGERS IV SOLN
INTRAVENOUS | Status: DC | PRN
  Administered 2013-03-18 (×2): via INTRAVENOUS

## 2013-03-18 MED ORDER — DEXTROSE 5 % IV SOLN
0.0000 ug/min | INTRAVENOUS | Status: DC
  Administered 2013-03-19: 17 ug/min via INTRAVENOUS
  Filled 2013-03-18: qty 2

## 2013-03-18 MED ORDER — MAGNESIUM SULFATE 40 MG/ML IJ SOLN
4.0000 g | Freq: Once | INTRAMUSCULAR | Status: AC
  Administered 2013-03-18: 4 g via INTRAVENOUS
  Filled 2013-03-18: qty 100

## 2013-03-18 MED ORDER — CALCIUM CHLORIDE 10 % IV SOLN
1.0000 g | Freq: Once | INTRAVENOUS | Status: AC | PRN
  Filled 2013-03-18: qty 10

## 2013-03-18 MED ORDER — METOPROLOL TARTRATE 25 MG/10 ML ORAL SUSPENSION
12.5000 mg | Freq: Two times a day (BID) | ORAL | Status: DC
  Filled 2013-03-18 (×3): qty 5

## 2013-03-18 MED ORDER — AMINOCAPROIC ACID 250 MG/ML IV SOLN
INTRAVENOUS | Status: DC | PRN
  Administered 2013-03-18: 5 g via INTRAVENOUS

## 2013-03-18 MED ORDER — INSULIN ASPART 100 UNIT/ML ~~LOC~~ SOLN
0.0000 [IU] | SUBCUTANEOUS | Status: DC
  Administered 2013-03-19 – 2013-03-20 (×3): 2 [IU] via SUBCUTANEOUS

## 2013-03-18 MED ORDER — BISACODYL 10 MG RE SUPP: 10.0000 mg | Freq: Every day | RECTAL | Status: DC

## 2013-03-18 MED ORDER — HEPARIN SODIUM (PORCINE) 1000 UNIT/ML IJ SOLN
INTRAMUSCULAR | Status: DC | PRN
  Administered 2013-03-18: 23000 [IU] via INTRAVENOUS

## 2013-03-18 MED ORDER — BISACODYL 5 MG PO TBEC
10.0000 mg | DELAYED_RELEASE_TABLET | Freq: Every day | ORAL | Status: DC
  Administered 2013-03-19 – 2013-03-21 (×2): 10 mg via ORAL
  Filled 2013-03-18 (×3): qty 2

## 2013-03-18 MED ORDER — METOPROLOL TARTRATE 1 MG/ML IV SOLN: 2.5000 mg | INTRAVENOUS | Status: DC | PRN

## 2013-03-18 MED ORDER — DEXTROSE 50 % IV SOLN: 16.0000 mL | Freq: Once | INTRAVENOUS | Status: AC | PRN

## 2013-03-18 MED ORDER — POTASSIUM CHLORIDE 10 MEQ/50ML IV SOLN: 10.0000 meq | INTRAVENOUS | Status: AC

## 2013-03-18 MED ORDER — ALBUMIN HUMAN 5 % IV SOLN
250.0000 mL | INTRAVENOUS | Status: AC | PRN
  Administered 2013-03-18 – 2013-03-19 (×3): 250 mL via INTRAVENOUS
  Filled 2013-03-18: qty 250

## 2013-03-18 MED ORDER — PROPOFOL 10 MG/ML IV BOLUS
INTRAVENOUS | Status: DC | PRN
  Administered 2013-03-18: 20 mg via INTRAVENOUS

## 2013-03-18 MED ORDER — ACETAMINOPHEN 160 MG/5ML PO SOLN
975.0000 mg | Freq: Four times a day (QID) | ORAL | Status: DC
  Administered 2013-03-19 (×2): 975 mg
  Filled 2013-03-18 (×2): qty 20.3

## 2013-03-18 MED ORDER — LACTATED RINGERS IV SOLN
INTRAVENOUS | Status: DC | PRN
  Administered 2013-03-18: 07:00:00 via INTRAVENOUS

## 2013-03-18 MED ORDER — SODIUM CHLORIDE 0.9 % IV SOLN
INTRAVENOUS | Status: DC
  Administered 2013-03-19: 19:00:00 via INTRAVENOUS

## 2013-03-18 MED ORDER — ASPIRIN EC 325 MG PO TBEC
325.0000 mg | DELAYED_RELEASE_TABLET | Freq: Every day | ORAL | Status: DC
  Administered 2013-03-19 – 2013-03-20 (×2): 325 mg via ORAL
  Filled 2013-03-18 (×2): qty 1

## 2013-03-18 MED ORDER — INSULIN REGULAR BOLUS VIA INFUSION
0.0000 [IU] | Freq: Three times a day (TID) | INTRAVENOUS | Status: DC
  Filled 2013-03-18: qty 10

## 2013-03-18 MED ORDER — METOPROLOL TARTRATE 12.5 MG HALF TABLET
12.5000 mg | ORAL_TABLET | Freq: Two times a day (BID) | ORAL | Status: DC
  Filled 2013-03-18 (×3): qty 1

## 2013-03-18 MED ORDER — KETOROLAC TROMETHAMINE 30 MG/ML IJ SOLN
30.0000 mg | Freq: Once | INTRAMUSCULAR | Status: AC | PRN
  Administered 2013-03-18: 30 mg via INTRAVENOUS
  Filled 2013-03-18: qty 1

## 2013-03-18 MED ORDER — DEXTROSE 5 % IV SOLN
10.0000 mg | INTRAVENOUS | Status: DC | PRN
  Administered 2013-03-18: 10 ug/min via INTRAVENOUS

## 2013-03-18 MED ORDER — ACETAMINOPHEN 500 MG PO TABS
1000.0000 mg | ORAL_TABLET | Freq: Four times a day (QID) | ORAL | Status: DC
  Administered 2013-03-19 – 2013-03-23 (×15): 1000 mg via ORAL
  Filled 2013-03-18 (×20): qty 2

## 2013-03-18 MED ORDER — LIDOCAINE HCL (CARDIAC) 20 MG/ML IV SOLN
INTRAVENOUS | Status: DC | PRN
  Administered 2013-03-18: 20 mg via INTRAVENOUS

## 2013-03-18 MED ORDER — BUPIVACAINE 0.5 % ON-Q PUMP SINGLE CATH 400 ML
400.0000 mL | INJECTION | Status: DC
  Filled 2013-03-18: qty 400

## 2013-03-18 MED ORDER — DEXTROSE 50 % IV SOLN
INTRAVENOUS | Status: AC
  Administered 2013-03-18: 16 mL
  Filled 2013-03-18: qty 50

## 2013-03-18 MED ORDER — ACETAMINOPHEN 10 MG/ML IV SOLN
1000.0000 mg | Freq: Once | INTRAVENOUS | Status: AC
  Administered 2013-03-18: 1000 mg via INTRAVENOUS
  Filled 2013-03-18: qty 100

## 2013-03-18 MED ORDER — BUPIVACAINE 0.5 % ON-Q PUMP SINGLE CATH 400 ML
INJECTION | Status: DC | PRN
  Administered 2013-03-18: 400 mL

## 2013-03-18 MED ORDER — SODIUM CHLORIDE 0.9 % IV SOLN
INTRAVENOUS | Status: DC
  Filled 2013-03-18: qty 1

## 2013-03-18 MED ORDER — MIDAZOLAM HCL 5 MG/5ML IJ SOLN
INTRAMUSCULAR | Status: DC | PRN
  Administered 2013-03-18 (×2): 4 mg via INTRAVENOUS
  Administered 2013-03-18 (×2): 2 mg via INTRAVENOUS

## 2013-03-18 MED ORDER — SODIUM CHLORIDE 0.9 % IV SOLN
250.0000 mL | INTRAVENOUS | Status: DC
  Administered 2013-03-19: 250 mL via INTRAVENOUS

## 2013-03-18 MED ORDER — SODIUM CHLORIDE 0.9 % IJ SOLN: 3.0000 mL | INTRAMUSCULAR | Status: DC | PRN

## 2013-03-18 MED ORDER — FAMOTIDINE IN NACL 20-0.9 MG/50ML-% IV SOLN
20.0000 mg | Freq: Two times a day (BID) | INTRAVENOUS | Status: AC
  Administered 2013-03-18: 20 mg via INTRAVENOUS

## 2013-03-18 MED ORDER — LACTATED RINGERS IV SOLN
INTRAVENOUS | Status: DC
  Administered 2013-03-18: 16:00:00 via INTRAVENOUS

## 2013-03-18 MED ORDER — 0.9 % SODIUM CHLORIDE (POUR BTL) OPTIME
TOPICAL | Status: DC | PRN
  Administered 2013-03-18: 6000 mL

## 2013-03-18 MED ORDER — DEXTROSE 5 % IV SOLN
1.5000 g | Freq: Two times a day (BID) | INTRAVENOUS | Status: AC
  Administered 2013-03-18 – 2013-03-20 (×4): 1.5 g via INTRAVENOUS
  Filled 2013-03-18 (×4): qty 1.5

## 2013-03-18 MED ORDER — DOCUSATE SODIUM 100 MG PO CAPS
200.0000 mg | ORAL_CAPSULE | Freq: Every day | ORAL | Status: DC
  Administered 2013-03-19 – 2013-03-21 (×3): 200 mg via ORAL
  Filled 2013-03-18 (×5): qty 2

## 2013-03-18 MED ORDER — LACTATED RINGERS IV SOLN
INTRAVENOUS | Status: DC | PRN
  Administered 2013-03-18 (×2): via INTRAVENOUS

## 2013-03-18 MED ORDER — MORPHINE SULFATE 2 MG/ML IJ SOLN
1.0000 mg | INTRAMUSCULAR | Status: AC | PRN
  Administered 2013-03-18 (×2): 1 mg via INTRAVENOUS
  Filled 2013-03-18: qty 1

## 2013-03-18 MED ORDER — ARTIFICIAL TEARS OP OINT
TOPICAL_OINTMENT | OPHTHALMIC | Status: DC | PRN
  Administered 2013-03-18: 1 via OPHTHALMIC

## 2013-03-18 MED ORDER — EPHEDRINE SULFATE 50 MG/ML IJ SOLN
INTRAMUSCULAR | Status: DC | PRN
  Administered 2013-03-18: 5 mg via INTRAVENOUS
  Administered 2013-03-18 (×3): 10 mg via INTRAVENOUS

## 2013-03-18 MED ORDER — INSULIN ASPART 100 UNIT/ML ~~LOC~~ SOLN
0.0000 [IU] | SUBCUTANEOUS | Status: AC
  Administered 2013-03-18 (×2): 4 [IU] via SUBCUTANEOUS

## 2013-03-18 MED ORDER — MIDAZOLAM HCL 2 MG/2ML IJ SOLN
2.0000 mg | INTRAMUSCULAR | Status: DC | PRN
Start: 2013-03-18 — End: 2013-03-19

## 2013-03-18 MED ORDER — SODIUM CHLORIDE 0.9 % IJ SOLN
3.0000 mL | Freq: Two times a day (BID) | INTRAMUSCULAR | Status: DC
  Administered 2013-03-19 – 2013-03-20 (×3): 3 mL via INTRAVENOUS

## 2013-03-18 MED ORDER — OXYCODONE HCL 5 MG PO TABS
5.0000 mg | ORAL_TABLET | ORAL | Status: DC | PRN
  Administered 2013-03-19 – 2013-03-20 (×7): 10 mg via ORAL
  Administered 2013-03-20: 5 mg via ORAL
  Filled 2013-03-18: qty 1
  Filled 2013-03-18 (×7): qty 2

## 2013-03-18 MED ORDER — VANCOMYCIN HCL IN DEXTROSE 1-5 GM/200ML-% IV SOLN
1000.0000 mg | Freq: Once | INTRAVENOUS | Status: AC
  Administered 2013-03-18: 1000 mg via INTRAVENOUS
  Filled 2013-03-18: qty 200

## 2013-03-18 MED ORDER — ROCURONIUM BROMIDE 100 MG/10ML IV SOLN
INTRAVENOUS | Status: DC | PRN
  Administered 2013-03-18 (×2): 50 mg via INTRAVENOUS

## 2013-03-18 MED ORDER — FENTANYL CITRATE 0.05 MG/ML IJ SOLN
INTRAMUSCULAR | Status: DC | PRN
  Administered 2013-03-18: 100 ug via INTRAVENOUS
  Administered 2013-03-18: 50 ug via INTRAVENOUS
  Administered 2013-03-18: 700 ug via INTRAVENOUS

## 2013-03-18 MED ORDER — LACTATED RINGERS IV SOLN: 500.0000 mL | Freq: Once | INTRAVENOUS | Status: AC | PRN

## 2013-03-18 MED ORDER — ASPIRIN 81 MG PO CHEW: 324.0000 mg | CHEWABLE_TABLET | Freq: Every day | ORAL | Status: DC

## 2013-03-18 MED ORDER — ONDANSETRON HCL 4 MG/2ML IJ SOLN
4.0000 mg | Freq: Four times a day (QID) | INTRAMUSCULAR | Status: DC | PRN
  Administered 2013-03-19 – 2013-03-20 (×3): 4 mg via INTRAVENOUS
  Filled 2013-03-18 (×3): qty 2

## 2013-03-18 MED ORDER — NITROGLYCERIN IN D5W 200-5 MCG/ML-% IV SOLN: 0.0000 ug/min | INTRAVENOUS | Status: DC

## 2013-03-18 SURGICAL SUPPLY — 132 items
ADAPTER CARDIO PERF ANTE/RETRO (ADAPTER) ×4 IMPLANT
ATTRACTOMAT 16X20 MAGNETIC DRP (DRAPES) IMPLANT
BAG DECANTER FOR FLEXI CONT (MISCELLANEOUS) ×4 IMPLANT
BENZOIN TINCTURE PRP APPL 2/3 (GAUZE/BANDAGES/DRESSINGS) ×4 IMPLANT
BLADE SURG 11 STRL SS (BLADE) ×4 IMPLANT
CANISTER SUCTION 2500CC (MISCELLANEOUS) ×8 IMPLANT
CANNULA FEM VENOUS REMOTE 22FR (CANNULA) ×4 IMPLANT
CANNULA FEMORAL ART 14 SM (MISCELLANEOUS) ×8 IMPLANT
CANNULA GUNDRY RCSP 15FR (MISCELLANEOUS) ×4 IMPLANT
CANNULA OPTISITE PERFUSION 16F (CANNULA) IMPLANT
CANNULA OPTISITE PERFUSION 18F (CANNULA) ×4 IMPLANT
CARDIAC SUCTION (MISCELLANEOUS) ×4 IMPLANT
CARDIOBLATE CARDIAC ABLATION (MISCELLANEOUS) ×4
CATH KIT ON Q 5IN SLV (PAIN MANAGEMENT) ×4 IMPLANT
CLAMP ISOLATOR SYNERGY LG (MISCELLANEOUS) ×4 IMPLANT
CLOTH BEACON ORANGE TIMEOUT ST (SAFETY) ×4 IMPLANT
CLSR STERI-STRIP ANTIMIC 1/2X4 (GAUZE/BANDAGES/DRESSINGS) ×4 IMPLANT
CONN ST 1/4X3/8  BEN (MISCELLANEOUS) ×2
CONN ST 1/4X3/8 BEN (MISCELLANEOUS) ×6 IMPLANT
CONT SPEC STER OR (MISCELLANEOUS) ×4 IMPLANT
COVER MAYO STAND STRL (DRAPES) ×4 IMPLANT
COVER SURGICAL LIGHT HANDLE (MISCELLANEOUS) ×8 IMPLANT
CRADLE DONUT ADULT HEAD (MISCELLANEOUS) ×4 IMPLANT
DERMABOND ADVANCED (GAUZE/BANDAGES/DRESSINGS) ×1
DERMABOND ADVANCED .7 DNX12 (GAUZE/BANDAGES/DRESSINGS) ×3 IMPLANT
DEVICE CARDIOBLATE CARDIAC ABL (MISCELLANEOUS) ×3 IMPLANT
DEVICE PMI PUNCTURE CLOSURE (MISCELLANEOUS) ×4 IMPLANT
DEVICE TROCAR PUNCTURE CLOSURE (ENDOMECHANICALS) ×4 IMPLANT
DRAIN CHANNEL 28F RND 3/8 FF (WOUND CARE) ×8 IMPLANT
DRAPE BILATERAL SPLIT (DRAPES) ×4 IMPLANT
DRAPE C-ARM 42X72 X-RAY (DRAPES) ×4 IMPLANT
DRAPE CV SPLIT W-CLR ANES SCRN (DRAPES) ×4 IMPLANT
DRAPE INCISE IOBAN 66X45 STRL (DRAPES) ×8 IMPLANT
DRAPE SLUSH MACHINE 52X66 (DRAPES) IMPLANT
DRAPE SLUSH/WARMER DISC (DRAPES) ×4 IMPLANT
DRSG COVADERM 4X14 (GAUZE/BANDAGES/DRESSINGS) ×4 IMPLANT
DRSG COVADERM 4X8 (GAUZE/BANDAGES/DRESSINGS) ×4 IMPLANT
DRSG TEGADERM 4X4.75 (GAUZE/BANDAGES/DRESSINGS) ×4 IMPLANT
ELECT BLADE 6.5 EXT (BLADE) ×4 IMPLANT
ELECT REM PT RETURN 9FT ADLT (ELECTROSURGICAL) ×8
ELECTRODE REM PT RTRN 9FT ADLT (ELECTROSURGICAL) ×6 IMPLANT
FEMORAL VENOUS CANN RAP (CANNULA) IMPLANT
GLOVE BIO SURGEON STRL SZ 6 (GLOVE) ×4 IMPLANT
GLOVE BIO SURGEON STRL SZ 6.5 (GLOVE) ×16 IMPLANT
GLOVE BIO SURGEON STRL SZ7.5 (GLOVE) ×4 IMPLANT
GLOVE BIOGEL PI IND STRL 7.0 (GLOVE) ×12 IMPLANT
GLOVE BIOGEL PI INDICATOR 7.0 (GLOVE) ×4
GLOVE ORTHO TXT STRL SZ7.5 (GLOVE) ×12 IMPLANT
GOWN PREVENTION PLUS XLARGE (GOWN DISPOSABLE) ×4 IMPLANT
GOWN STRL NON-REIN LRG LVL3 (GOWN DISPOSABLE) ×32 IMPLANT
GUIDEWIRE ANG ZIPWIRE 038X150 (WIRE) ×4 IMPLANT
INSERT CONFORM CROSS CLAMP 66M (MISCELLANEOUS) IMPLANT
INSERT CONFORM CROSS CLAMP 86M (MISCELLANEOUS) IMPLANT
IV NS IRRIG 3000ML ARTHROMATIC (IV SOLUTION) ×4 IMPLANT
KIT BASIN OR (CUSTOM PROCEDURE TRAY) ×4 IMPLANT
KIT DILATOR VASC 18G NDL (KITS) ×4 IMPLANT
KIT DRAINAGE VACCUM ASSIST (KITS) ×4 IMPLANT
KIT ROOM TURNOVER OR (KITS) ×4 IMPLANT
KIT SUCTION CATH 14FR (SUCTIONS) ×4 IMPLANT
LEAD PACING MYOCARDI (MISCELLANEOUS) ×4 IMPLANT
LINE VENT (MISCELLANEOUS) ×4 IMPLANT
NEEDLE AORTIC ROOT 14G 7F (CATHETERS) ×4 IMPLANT
NS IRRIG 1000ML POUR BTL (IV SOLUTION) ×20 IMPLANT
PACK OPEN HEART (CUSTOM PROCEDURE TRAY) ×4 IMPLANT
PAD ARMBOARD 7.5X6 YLW CONV (MISCELLANEOUS) ×8 IMPLANT
PAD ELECT DEFIB RADIOL ZOLL (MISCELLANEOUS) ×4 IMPLANT
PATCH CORMATRIX 4CMX7CM (Prosthesis & Implant Heart) ×4 IMPLANT
PENCIL BUTTON HOLSTER BLD 10FT (ELECTRODE) ×4 IMPLANT
PROBE CRYO2-ABLATION MALLABLE (MISCELLANEOUS) IMPLANT
PUMP ON Q 400MLX5ML/HR (PAIN MANAGEMENT) ×4 IMPLANT
RETRACTOR TRL SOFT TISSUE LG (INSTRUMENTS) ×4 IMPLANT
RETRACTOR TRM SOFT TISSUE 7.5 (INSTRUMENTS) IMPLANT
RING MITRAL MEMO 3D 30MM SMD30 (Prosthesis & Implant Heart) ×4 IMPLANT
SET CANNULATION TOURNIQUET (MISCELLANEOUS) ×4 IMPLANT
SET CARDIOPLEGIA MPS 5001102 (MISCELLANEOUS) ×4 IMPLANT
SET IRRIG TUBING LAPAROSCOPIC (IRRIGATION / IRRIGATOR) ×4 IMPLANT
SOLUTION ANTI FOG 6CC (MISCELLANEOUS) ×4 IMPLANT
SPONGE GAUZE 4X4 12PLY (GAUZE/BANDAGES/DRESSINGS) ×12 IMPLANT
SUCKER WEIGHTED FLEX (MISCELLANEOUS) ×8 IMPLANT
SUT BONE WAX W31G (SUTURE) ×4 IMPLANT
SUT E-PACK MINIMALLY INVASIVE (SUTURE) ×4 IMPLANT
SUT ETHIBOND (SUTURE) ×8 IMPLANT
SUT ETHIBOND 2 0 SH (SUTURE) ×4 IMPLANT
SUT ETHIBOND 2 0 V4 (SUTURE) IMPLANT
SUT ETHIBOND 2 0V4 GREEN (SUTURE) IMPLANT
SUT ETHIBOND 2-0 RB-1 WHT (SUTURE) ×8 IMPLANT
SUT ETHIBOND 4 0 TF (SUTURE) IMPLANT
SUT ETHIBOND 5 0 C 1 30 (SUTURE) IMPLANT
SUT ETHIBOND NAB MH 2-0 36IN (SUTURE) IMPLANT
SUT ETHIBOND X763 2 0 SH 1 (SUTURE) ×4 IMPLANT
SUT GORETEX 6.0 TH-9 30 IN (SUTURE) IMPLANT
SUT GORETEX CV 4 TH 22 36 (SUTURE) ×4 IMPLANT
SUT GORETEX CV-5THC-13 36IN (SUTURE) ×40 IMPLANT
SUT GORETEX CV4 TH-18 (SUTURE) ×8 IMPLANT
SUT GORETEX TH-18 36 INCH (SUTURE) IMPLANT
SUT MNCRL AB 3-0 PS2 18 (SUTURE) IMPLANT
SUT PROLENE 3 0 SH DA (SUTURE) IMPLANT
SUT PROLENE 3 0 SH1 36 (SUTURE) ×8 IMPLANT
SUT PROLENE 4 0 RB 1 (SUTURE)
SUT PROLENE 4-0 RB1 .5 CRCL 36 (SUTURE) IMPLANT
SUT PROLENE 5 0 C 1 36 (SUTURE) ×4 IMPLANT
SUT PROLENE 6 0 C 1 30 (SUTURE) ×8 IMPLANT
SUT SILK  1 MH (SUTURE)
SUT SILK 1 MH (SUTURE) IMPLANT
SUT SILK 1 TIES 10X30 (SUTURE) IMPLANT
SUT SILK 2 0 SH CR/8 (SUTURE) IMPLANT
SUT SILK 2 0 TIES 10X30 (SUTURE) IMPLANT
SUT SILK 2 0SH CR/8 30 (SUTURE) IMPLANT
SUT SILK 3 0 (SUTURE)
SUT SILK 3 0 SH CR/8 (SUTURE) IMPLANT
SUT SILK 3 0SH CR/8 30 (SUTURE) IMPLANT
SUT SILK 3-0 18XBRD TIE 12 (SUTURE) IMPLANT
SUT TEM PAC WIRE 2 0 SH (SUTURE) IMPLANT
SUT VIC AB 2-0 CTX 36 (SUTURE) IMPLANT
SUT VIC AB 2-0 UR6 27 (SUTURE) IMPLANT
SUT VIC AB 3-0 SH 8-18 (SUTURE) ×4 IMPLANT
SUT VICRYL 2 TP 1 (SUTURE) IMPLANT
SYRINGE 10CC LL (SYRINGE) ×4 IMPLANT
SYS ATRICLIP LAA EXCLUSION 45 (CLIP) IMPLANT
SYSTEM SAHARA CHEST DRAIN ATS (WOUND CARE) ×8 IMPLANT
TAPE CLOTH SURG 4X10 WHT LF (GAUZE/BANDAGES/DRESSINGS) ×8 IMPLANT
TAPE PAPER 2X10 WHT MICROPORE (GAUZE/BANDAGES/DRESSINGS) ×4 IMPLANT
TOWEL OR 17X24 6PK STRL BLUE (TOWEL DISPOSABLE) ×4 IMPLANT
TOWEL OR 17X26 10 PK STRL BLUE (TOWEL DISPOSABLE) ×4 IMPLANT
TRAY FOLEY IC TEMP SENS 14FR (CATHETERS) ×4 IMPLANT
TROCAR XCEL BLADELESS 5X75MML (TROCAR) ×4 IMPLANT
TROCAR XCEL NON-BLD 11X100MML (ENDOMECHANICALS) ×12 IMPLANT
TUBE SUCT INTRACARD DLP 20F (MISCELLANEOUS) ×4 IMPLANT
TUNNELER SHEATH ON-Q 11GX8 (MISCELLANEOUS) ×4 IMPLANT
UNDERPAD 30X30 INCONTINENT (UNDERPADS AND DIAPERS) ×4 IMPLANT
WATER STERILE IRR 1000ML POUR (IV SOLUTION) ×8 IMPLANT
WIRE BENTSON .035X145CM (WIRE) ×4 IMPLANT

## 2013-03-18 NOTE — Progress Notes (Signed)
Patient ID: Ariel Simpson, female   DOB: 09/20/1956, 58 y.o.   MRN: [id number]                   [address]            [name] [address]          [phone]     Day of Surgery Procedure(s) (LRB): MINIMALLY INVASIVE MITRAL VALVE REPAIR (MVR) (Right) INTRAOPERATIVE TRANSESOPHAGEAL ECHOCARDIOGRAM (N/A) MAZE (N/A)  Total Length of Stay:  LOS: 6 days  BP 103/76  Pulse 90  Temp(Src) 98.6 F (37 C) (Oral)  Resp 15  Ht 5\' 6"  (1.676 m)  Wt 158 lb 11.7 oz (72 kg)  BMI 25.63 kg/m2  SpO2 100%  .Intake/Output     04/29 0701 - 04/30 0700   P.O.    I.V. (mL/kg) 3811.9 (52.9)   Blood 250   IV Piggyback 850   Total Intake(mL/kg) 4911.9 (68.2)   Urine (mL/kg/hr) 2165 (2.4)   Blood 500 (0.6)   Chest Tube 160 (0.2)   Total Output 2825   Net +2086.9         . sodium chloride 20 mL/hr at 03/18/13 1545  . sodium chloride    . [START ON 03/19/2013] sodium chloride    . dexmedetomidine 0.1 mcg/kg/hr (03/18/13 1900)  . insulin (NOVOLIN-R) infusion Stopped (03/18/13 1814)  . lactated ringers 20 mL/hr at 03/18/13 1530  . nitroGLYCERIN Stopped (03/18/13 1535)  . phenylephrine (NEO-SYNEPHRINE) Adult infusion 10 mcg/min (03/18/13 1802)     Lab Results  Component Value Date   WBC 11.9* 03/18/2013   HGB 10.2* 03/18/2013   HCT 30.0* 03/18/2013   PLT 151 03/18/2013   GLUCOSE 93 03/18/2013   ALT 39* 03/12/2013   AST 36 03/12/2013   NA 141 03/18/2013   K 4.0 03/18/2013   CL 97 03/18/2013   CREATININE 1.25* 03/18/2013   BUN 19 03/18/2013   CO2 29 03/18/2013   TSH 2.043 03/03/2013   INR 1.56* 03/18/2013   HGBA1C 5.5 03/18/2013   Waking up follows commands Weaning vent Not bleeding  Delight Ovens MD  Beeper [phone] Office [phone] 03/18/2013 7:32 PM

## 2013-03-18 NOTE — Op Note (Signed)
CARDIOTHORACIC SURGERY OPERATIVE NOTE  Date of Procedure:   03/18/2013  Preoperative Diagnosis:    Severe Mitral Regurgitation  Recurrent Paroxysmal Atrial Fibrillation  Postoperative Diagnosis: Same  Procedure:    Minimally-Invasive Mitral Valve Repair  Complex valvuloplasty including triangular resection of posterior leaflet  Artificial Gore-tex neocord placement x4  Sorin Memo 3D Ring Annuloplasty (size 30mm, catalog #[id number], serial #[id number])   Maze Procedure  Complete biatrial lesion set using cryothermy and bipolar radiofrequency ablation  Oversewing of Left Atrial Appendage  Surgeon: Salvatore Decent. 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 The patient states that she first developed severe symptomatic tachypalpitations between 10 and 15 years ago. She was diagnosed with paroxysmal supraventricular tachycardia, and at that time an echocardiogram was performed demonstrating mitral valve prolapse with mitral regurgitation. She has not had a followup echocardiogram since then until approximately 2 weeks ago when she developed fairly abrupt onset of severe exertional shortness of breath with chest tightness, dry cough, lower extremity edema and orthopnea.  She presented to the [facility] walk-in clinic and was  promptly sent to the emergency department where she was diagnosed with acute diastolic congestive heart failure.  She was admitted to the hospital and transthoracic echocardiogram confirmed the presence of mitral valve prolapse with flail segment of the posterior leaflet of the mitral valve and severe mitral regurgitation. Left ventricular systolic function appear preserved. She underwent left and right heart catheterization by Dr. Katrinka Blazing documenting the presence of normal coronary artery anatomy with no significant coronary artery disease. There was normal left ventricular systolic function with moderate pulmonary hypertension.  The patient's symptoms improved with diuretic therapy. She did develop a transient episode of paroxysmal atrial fibrillation for which she was treated with amiodarone.  The patient was discharged from the hospital and referred for elective surgical consultation. Prior to surgery the patient developed acute exacerbation of diastolic congestive heart failure with pulmonary edema for which she was readmitted to the hospital. She was treated with intravenous diuretic therapy with success. The patient now presents for elective surgical intervention.  The patient has been seen in consultation and counseled at length regarding the indications, risks and potential benefits of surgery.  All questions have been answered, and the patient provides full informed consent for the operation as described.     DETAILS OF THE OPERATIVE PROCEDURE  The patient is brought to the operating room on the above mentioned date and central monitoring was established by the anesthesia team including placement of Swan-Ganz catheter through the left internal jugular vein.  Of note, baseline pulmonary artery pressures are severely elevated, averaging between two thirds and three quarters systemic pressure.  A radial arterial line is placed. The patient is placed in the supine position on the operating table.   Intravenous antibiotics are administered. General endotracheal anesthesia is induced uneventfully. The patient is  initially intubated using a dual lumen endotracheal tube.  A Foley catheter is placed.  Baseline transesophageal echocardiogram was performed.  Findings are notable for a large flail segment of the posterior leaflet (P2) of the mitral valve with severe (4+) mitral regurgitation. There is normal left ventricular systolic function with moderate left ventricular chamber enlargement. Aortic valve appears normal. Right ventricular function appears normal. There is trace tricuspid regurgitation.  A soft roll is placed behind the patient's left scapula and the neck gently extended and turned to the left.   The patient's right neck, chest, abdomen, both groins, and both lower extremities are prepared and draped in a sterile manner. A time out procedure is performed.  A small incision is made in the right inguinal crease and the anterior surface of the right common femoral artery and right common femoral vein are identified.  A right miniature anterolateral thoracotomy incision is performed. The incision is placed just lateral to and superior to the right nipple. The pectoralis major muscle is retracted medially and completely preserved. The right pleural space is entered through the 3rd intercostal space. A soft tissue retractor is placed.  Two 11 mm ports are placed through separate stab incisions inferiorly. The right pleural space is insufflated continuously with carbon dioxide gas through the posterior port during the remainder of the operation.  A pledgeted sutures placed through the dome of the right hemidiaphragm and retracted inferiorly to facilitate exposure.  A longitudinal incision is made in the pericardium 3 cm anterior to the phrenic nerve and silk traction sutures are placed on either side of the incision for exposure.  The patient is placed in Trendelenburg position. The right internal  jugular vein is cannulated with Seldinger technique and a guidewire advanced into the right atrium. The patient is heparinized systemically. The right internal jugular vein is cannulated with a 14 [place] pediatric femoral venous cannula. Pursestring sutures are placed on the anterior surface of the right common femoral vein and right common femoral artery. The right common femoral vein is cannulated with the Seldinger technique and a guidewire is advanced under transesophageal echocardiogram guidance through the right atrium. The femoral vein is cannulated with a long 22 French femoral venous cannula. The right common femoral artery is cannulated with Seldinger technique and a flexible guidewire is advanced until it can be appreciated intraluminally in the descending thoracic aorta on transesophageal echocardiogram. The femoral artery is cannulated with an 18 French femoral arterial cannula.  Adequate heparinization is verified.      The entire pre-bypass portion of the operation was notable for stable hemodynamics.  Cardiopulmonary bypass was begun.  Vacuum assist venous drainage is utilized. The incision in the pericardium is extended in both directions. Venous drainage and exposure are notably excellent.   A retrograde cardioplegia cannula is placed through the right atrium into the coronary sinus using transesophageal echocardiogram guidance.  An antegrade cardioplegia cannula is placed in the ascending aorta.  The patient is cooled to 28C systemic temperature.  The aortic cross clamp is applied and cold blood cardioplegia is delivered initially in an antegrade fashion through the aortic root.   Supplemental cardioplegia is given retrograde through the coronary sinus catheter. The initial cardioplegic arrest is rapid with early diastolic arrest.  Repeat doses of cardioplegia are administered intermittently every 20 to 30 minutes throughout the entire cross clamp portion of the operation through the aortic  root and through the coronary sinus catheter in order to maintain completely flat electrocardiogram.  Myocardial protection  was felt to be excellent.  A left atriotomy incision was performed through the interatrial groove and extended partially across the back wall of the left atrium after opening the oblique sinus inferiorly.  The mitral valve is exposed using a self-retaining retractor.  Mitral valve was inspected and notable for classical fibroelastic deficiency type degenerative disease. Nearly the entire middle scallop (P2) of the posterior leaflet was flail with multiple ruptured primary cords. The remainder of the posterior leaflet was fairly normal, as is the majority of the anterior leaflet there is only mild thickening and fibrosis the leaflets and there is no other significant area of prolapse. There is no annular calcification.   The left atrial lesion set of the Cox cryomaze procedure is now performed using 3 minute duration for all cryothermy lesions.  Initially a lesion is placed along the endocardial surface of the left atrium from the caudad apex of the atriotomy incision across the posterior wall of the left atrium onto the posterior mitral annulus.  A mirror image lesion along the epicardial surface is then performed with the probe posterior to the left atrium, crossing over the coronary sinus.  Two lesions are then performed to create a box isolating all of the pulmonary veins from the remainder of the left atrium.  The first lesion is placed from the cephalad apex of the atriotomy incision across the dome of the left atrium to just anterior to the left sided pulmonary veins.  The second lesion completes the box from the caudad apex of the atriotomy incision across the back wall of the left atrium to connect with the previous lesion just anterior to the left sided pulmonary veins.    Interrupted 2-0 Ethibond horizontal mattress sutures are placed circumferentially around the entire mitral  valve annulus. The sutures will ultimately be utilized for ring annuloplasty, and at this juncture there are utilized to suspend the valve symmetrically.  A pledgeted CV 4 Gore-Tex sutures placed through the head of the anterior papillary muscle in a horizontal mattress fashion and the sutures tied.  A second pledgeted CV 4 Gore-Tex sutures placed through the head of the posterior papillary muscle in a horizontal mattress fashion and the sutures tied. The 4 limbs of these sutures are gathered in organized fashion for later retrieval to be utilized for artificial Gore-Tex neo-cord placement.  The flail segment of the middle scallop (P2) is repaired using a simple triangular resection. A total of approximately 25% of the surface area of P2 was resected. The intervening vertical defect in the posterior leaflet and subsequently closed using interrupted everting simple CV 5 Gore-Tex sutures.  The individual limbs of the Goretex neocords were retrieved from the LV chamber, woven into the posterior leaflet beginning at the free margin where they were placed from the ventricular surface to the atrial surface, and then woven in a diamond shaped fashion towards the posterior mitral annulus.  The pair of cords from the anterior papillary muscle are woven in to the P2 segment of the posterior leaflet on the anterior side of the vertical defect in P2. The pair of cords from the posterior leaflet or conversely woven into the posterior leaflet on the posterior side of the vertical defect in P2.  The Goretex sutures were then tied while the LV was distended with saline so as to adjust the length of the neocords to the appropriate length.  The valve is tested with saline and appears reasonably competent even prior to ring annuloplasty.  The valve is  sized to accept a 30mm annuloplasty ring based upon the distance between the left and right commissures, the height and the surface area of the anterior leaflet.  A Sorin Memo 3D  annuloplasty ring (size 30mm, catalog # 30, serial # [id number]) is implanted uneventfully.  The valve is again tested with saline and appears to be perfectly competent with a broad symmetrical line of coaptation of the anterior and posterior leaflet. There is no residual leak. Rewarming is begun.  The atriotomy was closed using a 2-layer closure of running 3-0 Prolene suture after placing a sump drain across the mitral valve to serve as a left ventricular vent.  One final dose of warm retrograde "hot shot" cardioplegia was administered retrograde through the coronary sinus catheter while all air was evacuated through the aortic root.  The aortic cross clamp was removed after a total cross clamp time of 147 minutes.  The retrograde cardioplegia cannula was removed and the small hole in the right atrium extended a short distance.  The AtriCure bipolar radiofrequency ablation clamp is utilized to create a series of linear lesions in the right atrium, each with one limb of the clamp along the endocardial surface and the other along the epicardial surface. The first lesion is placed from the posterior apex of the atriotomy incision and along the lateral wall of the right atrium to reach the lateral aspect of the superior vena cava. A second lesion is placed in the opposite direction from the posterior apex of the atriotomy incision along the lateral wall to reach the lateral aspect of the inferior vena cava. A third lesion is placed from the midportion of the atriotomy incision extending at a right angle to reach the tip of the right atrial appendage. A fourth lesion is placed from the anterior apex of the atriotomy incision in an anterior and inferior direction to reach the acute margin of the heart. Finally, the cryotherapy probe is utilized to complete the right atrial lesion set by placing the probe along the endocardial surface of the right atrium from the anterior apex of the atriotomy incision to reach the  tricuspid annulus at the 2:00 position. The atriotomy incision is closed with a 2 layer closure of running 4-0 Prolene suture.  Epicardial pacing wires are fixed to the inferior wall of the right ventricule and to the right atrial appendage. The patient is rewarmed to 37C temperature. The left ventricular vent is removed.  The patient is ventilated and flow volumes turndown while the mitral valve repair is inspected using transesophageal echocardiogram. The valve repair appears intact with no residual leak. The antegrade cardioplegia cannula is now removed. The patient is weaned and disconnected from cardiopulmonary bypass.  The patient's rhythm at separation from bypass was AV paced.  The patient was weaned from bypass without any inotropic support. Total cardiopulmonary bypass time for the operation was 201 minutes.  Followup transesophageal echocardiogram performed after separation from bypass revealed a well-seated annuloplasty ring in the mitral position with a normal functioning mitral valve. There was no residual leak.  Left ventricular function was unchanged from preoperatively.  The femoral arterial and venous cannulae were removed uneventfully. There was a palpable pulse in the distal right common femoral artery after removal of the cannula. Protamine was administered to reverse the anticoagulation. The right internal jugular cannula was removed and manual pressure held on the neck for 15 minutes.  Single lung ventilation was begun. The atriotomy closure was inspected for hemostasis. The pericardial sac was drained  using a 28 French Bard drain placed through the anterior port incision.  The pericardium was closed using a patch of core matrix bovine submucosal tissue patch. The right pleural space is irrigated with saline solution and inspected for hemostasis. The right pleural space was drained using a 28 French Bard drain placed through the posterior port incision. The miniature thoracotomy  incision was closed in multiple layers in routine fashion. The right groin incision was inspected for hemostasis and closed in multiple layers in routine fashion.  The post-bypass portion of the operation was notable for stable rhythm and hemodynamics.  No blood products were administered during the operation.  The patient tolerated the procedure well.  The patient was reintubated using a single lumen endotracheal tube and subsequently transported to the surgical intensive care unit in stable condition. There were no intraoperative complications. All sponge instrument and needle counts are verified correct at completion of the operation.    Salvatore Decent. Cornelius Moras MD 03/18/2013 3:52 PM

## 2013-03-18 NOTE — Progress Notes (Signed)
Dr. Cornelius Moras updated on results of repeat G3, hemodynamics and CBG 59 treated with 16 ml D50.

## 2013-03-18 NOTE — Procedures (Signed)
Extubation Procedure Note  Patient Details:   Name: Ariel Simpson DOB: 06/07/1956 MRN: [id number]   Airway Documentation:     Evaluation  O2 sats: stable throughout Complications: No apparent complications Patient did tolerate procedure well. Bilateral Breath Sounds: Rhonchi Suctioning: Airway Yes  Met all parameters per SICU wean protocol. NIF-30, VC 1300 and post extubation IS done with great efforts, 750-1000ML. Placed on 4l [place]   Erskine Speed 03/18/2013, 7:44 PM

## 2013-03-18 NOTE — Progress Notes (Signed)
Hypoglycemic Event  CBG: 59  Treatment: Dextrose 50% 16 ml  Symptoms: None  Follow-up CBG: Time: 1712 CBG Result:120  Possible Reasons for Event: Unknown  Comments/MD notified:Dr. Cornelius Moras on rounds    Salley Slaughter  Remember to initiate Hypoglycemia Order Set & complete

## 2013-03-18 NOTE — Brief Op Note (Addendum)
   [address]       [name] [address]             [phone]   03/12/2013 - 03/18/2013  1:09 PM  PATIENT:  Ariel Simpson  57 y.o. 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

## 2013-03-18 NOTE — Progress Notes (Signed)
  Echocardiogram Echocardiogram Transesophageal has been performed.  Margreta Journey 03/18/2013, 9:41 AM

## 2013-03-18 NOTE — Transfer of Care (Signed)
Immediate Anesthesia Transfer of Care Note  Patient: Ariel Simpson  Procedure(s) Performed: Procedure(s): MINIMALLY INVASIVE MITRAL VALVE REPAIR (MVR) (Right) INTRAOPERATIVE TRANSESOPHAGEAL ECHOCARDIOGRAM (N/A) MAZE (N/A)  Patient Location: SICU  Anesthesia Type:General  Level of Consciousness: sedated and Patient remains intubated per anesthesia plan  Airway & Oxygen Therapy: Patient remains intubated per anesthesia plan and Patient placed on Ventilator (see vital sign flow sheet for setting)  Post-op Assessment: Report given to PACU RN and Post -op Vital signs reviewed and stable  Post vital signs: Reviewed and stable  Complications: No apparent anesthesia complications

## 2013-03-18 NOTE — Preoperative (Signed)
Beta Blockers   Reason not to administer Beta Blockers:Not Applicable 

## 2013-03-18 NOTE — Anesthesia Preprocedure Evaluation (Addendum)
Anesthesia Evaluation  Patient identified by MRN, date of birth, ID band Patient awake    Reviewed: Allergy & Precautions, H&P , NPO status , Patient's Chart, lab work & pertinent test results, reviewed documented beta blocker date and time   History of Anesthesia Complications (+) PONV  Airway Mallampati: II TM Distance: >3 FB Neck ROM: Full    Dental  (+) Teeth Intact and Dental Advisory Given   Pulmonary shortness of breath and at rest,  breath sounds clear to auscultation  Pulmonary exam normal       Cardiovascular hypertension, Pt. on medications and Pt. on home beta blockers +CHF + dysrhythmias Atrial Fibrillation + Valvular Problems/Murmurs (ECHO: flail portion of posterior leaflet, normal LVF, EF 65-70%) MVP and MR Rhythm:Regular Rate:Normal + Systolic murmurs (III/VI)    Neuro/Psych negative neurological ROS     GI/Hepatic negative GI ROS, Neg liver ROS,   Endo/Other  negative endocrine ROS  Renal/GU negative Renal ROS     Musculoskeletal negative musculoskeletal ROS (+)   Abdominal   Peds  Hematology negative hematology ROS (+)   Anesthesia Other Findings   Reproductive/Obstetrics negative OB ROS                        Anesthesia Physical Anesthesia Plan  ASA: III  Anesthesia Plan: General   Post-op Pain Management:    Induction: Intravenous  Airway Management Planned: Oral ETT  Additional Equipment: Arterial line, PA Cath, 3D TEE and Ultrasound Guidance Line Placement  Intra-op Plan:   Post-operative Plan: Post-operative intubation/ventilation  Informed Consent: I have reviewed the patients History and Physical, chart, labs and discussed the procedure including the risks, benefits and alternatives for the proposed anesthesia with the patient or authorized representative who has indicated his/her understanding and acceptance.   Dental advisory given  Plan Discussed  with: CRNA and Surgeon  Anesthesia Plan Comments: (Plan routine monitors, A line, PA cath, GETA with TEE )        Anesthesia Quick Evaluation

## 2013-03-19 ENCOUNTER — Inpatient Hospital Stay (HOSPITAL_COMMUNITY): Payer: BC Managed Care – PPO

## 2013-03-19 ENCOUNTER — Encounter (HOSPITAL_COMMUNITY): Payer: Self-pay | Admitting: Thoracic Surgery (Cardiothoracic Vascular Surgery)

## 2013-03-19 LAB — POCT I-STAT, CHEM 8
Calcium, Ion: 1.17 mmol/L (ref 1.12–1.23)
Chloride: 106 mEq/L (ref 96–112)
Creatinine, Ser: 0.9 mg/dL (ref 0.50–1.10)
Creatinine, Ser: 0.8 mg/dL (ref 0.50–1.10)
Glucose, Bld: 163 mg/dL — ABNORMAL HIGH (ref 70–99)
HCT: 27 % — ABNORMAL LOW (ref 36.0–46.0)
Hemoglobin: 9.2 g/dL — ABNORMAL LOW (ref 12.0–15.0)
Potassium: 4.6 mEq/L (ref 3.5–5.1)
Potassium: 4.2 mEq/L (ref 3.5–5.1)
Sodium: 136 mEq/L (ref 135–145)

## 2013-03-19 LAB — CBC
Hemoglobin: 9 g/dL — ABNORMAL LOW (ref 12.0–15.0)
MCH: 31.1 pg (ref 26.0–34.0)
MCH: 31 pg (ref 26.0–34.0)
MCHC: 35.2 g/dL (ref 30.0–36.0)
MCHC: 34.5 g/dL (ref 30.0–36.0)
MCV: 88.6 fL (ref 78.0–100.0)
Platelets: 170 10*3/uL (ref 150–400)
RBC: 2.89 MIL/uL — ABNORMAL LOW (ref 3.87–5.11)
RBC: 2.84 MIL/uL — ABNORMAL LOW (ref 3.87–5.11)

## 2013-03-19 LAB — POCT I-STAT 3, ART BLOOD GAS (G3+)
Acid-base deficit: 1 mmol/L (ref 0.0–2.0)
Bicarbonate: 23.2 mEq/L (ref 20.0–24.0)
O2 Saturation: 95 %
TCO2: 24 mmol/L (ref 0–100)
pO2, Arterial: 74 mmHg — ABNORMAL LOW (ref 80.0–100.0)

## 2013-03-19 LAB — BASIC METABOLIC PANEL
CO2: 23 mEq/L (ref 19–32)
Calcium: 8.2 mg/dL — ABNORMAL LOW (ref 8.4–10.5)
Creatinine, Ser: 0.82 mg/dL (ref 0.50–1.10)
GFR calc non Af Amer: 77 mL/min — ABNORMAL LOW (ref >90)
Glucose, Bld: 123 mg/dL — ABNORMAL HIGH (ref 70–99)
Sodium: 136 mEq/L (ref 135–145)

## 2013-03-19 LAB — GLUCOSE, CAPILLARY
Glucose-Capillary: 109 mg/dL — ABNORMAL HIGH (ref 70–99)
Glucose-Capillary: 127 mg/dL — ABNORMAL HIGH (ref 70–99)

## 2013-03-19 LAB — CREATININE, SERUM
Creatinine, Ser: 0.83 mg/dL (ref 0.50–1.10)
GFR calc non Af Amer: 76 mL/min — ABNORMAL LOW (ref >90)

## 2013-03-19 LAB — MAGNESIUM: Magnesium: 2.8 mg/dL — ABNORMAL HIGH (ref 1.5–2.5)

## 2013-03-19 MED ORDER — MORPHINE SULFATE 2 MG/ML IJ SOLN
2.0000 mg | INTRAMUSCULAR | Status: DC | PRN
  Filled 2013-03-19: qty 1

## 2013-03-19 MED ORDER — INSULIN ASPART 100 UNIT/ML ~~LOC~~ SOLN
0.0000 [IU] | SUBCUTANEOUS | Status: DC
  Administered 2013-03-19 – 2013-03-20 (×3): 2 [IU] via SUBCUTANEOUS

## 2013-03-19 MED FILL — Electrolyte-R (PH 7.4) Solution: INTRAVENOUS | Qty: 5000 | Status: AC

## 2013-03-19 MED FILL — Heparin Sodium (Porcine) Inj 1000 Unit/ML: INTRAMUSCULAR | Qty: 30 | Status: AC

## 2013-03-19 MED FILL — Sodium Chloride IV Soln 0.9%: INTRAVENOUS | Qty: 1000 | Status: AC

## 2013-03-19 MED FILL — Lidocaine HCl IV Inj 20 MG/ML: INTRAVENOUS | Qty: 5 | Status: AC

## 2013-03-19 MED FILL — Sodium Bicarbonate IV Soln 8.4%: INTRAVENOUS | Qty: 50 | Status: AC

## 2013-03-19 MED FILL — Mannitol IV Soln 20%: INTRAVENOUS | Qty: 500 | Status: AC

## 2013-03-19 MED FILL — Sodium Chloride Irrigation Soln 0.9%: Qty: 3000 | Status: AC

## 2013-03-19 MED FILL — Magnesium Sulfate Inj 50%: INTRAMUSCULAR | Qty: 10 | Status: AC

## 2013-03-19 MED FILL — Potassium Chloride Inj 2 mEq/ML: INTRAVENOUS | Qty: 40 | Status: AC

## 2013-03-19 NOTE — Progress Notes (Signed)
Patient ID: Ariel Simpson, female   DOB: 02/09/1956, 57 y.o.   MRN: [id number]  SICU Evening Rounds:  Hemodynamically stable. Still on some neo A-paced at 90  Urine output ok  BMET    Component Value Date/Time   NA 136 03/19/2013 1731   K 4.2 03/19/2013 1731   CL 100 03/19/2013 1731   CO2 23 03/19/2013 0350   GLUCOSE 154* 03/19/2013 1731   BUN 11 03/19/2013 1731   CREATININE 0.80 03/19/2013 1731   CALCIUM 8.2* 03/19/2013 0350   GFRNONAA 76* 03/19/2013 1700   GFRAA 88* 03/19/2013 1700    CBC    Component Value Date/Time   WBC 15.4* 03/19/2013 1700   RBC 2.84* 03/19/2013 1700   HGB 9.2* 03/19/2013 1731   HCT 27.0* 03/19/2013 1731   PLT 170 03/19/2013 1700   MCV 89.8 03/19/2013 1700   MCH 31.0 03/19/2013 1700   MCHC 34.5 03/19/2013 1700   RDW 14.0 03/19/2013 1700   LYMPHSABS 2.1 03/12/2013 0404   MONOABS 0.5 03/12/2013 0404   EOSABS 0.1 03/12/2013 0404   BASOSABS 0.0 03/12/2013 0404    Wean neo as tolerated.

## 2013-03-19 NOTE — Anesthesia Postprocedure Evaluation (Signed)
  Anesthesia Post-op Note  Patient: Pocahontas Cohenour  Procedure(s) Performed: Procedure(s): MINIMALLY INVASIVE MITRAL VALVE REPAIR (MVR) (Right) INTRAOPERATIVE TRANSESOPHAGEAL ECHOCARDIOGRAM (N/A) MAZE (N/A)  Patient Location: ICU  Anesthesia Type:General  Level of Consciousness: awake, alert , oriented and patient cooperative  Airway and Oxygen Therapy: Patient Spontanous Breathing and Patient connected to nasal cannula oxygen  Post-op Pain: mild  Post-op Assessment: Post-op Vital signs reviewed, Patient's Cardiovascular Status Stable, Respiratory Function Stable, Patent Airway, No signs of Nausea or vomiting, Adequate PO intake, Pain level controlled, No headache, No backache, No residual numbness and No residual motor weakness  Post-op Vital Signs: Reviewed and stable, pt on phenylephrine gtt 58mcg/min with SBP 80-100.    Complications: No apparent anesthesia complications

## 2013-03-19 NOTE — Progress Notes (Signed)
                   [address]            [place] [address]          [phone]      1 Day Post-Op Procedure(s) (LRB): MINIMALLY INVASIVE MITRAL VALVE REPAIR (MVR) (Right) INTRAOPERATIVE TRANSESOPHAGEAL ECHOCARDIOGRAM (N/A) MAZE (N/A)  Subjective: Patient not feeling all that well this morning. Has incisional pain.  Objective: Vital signs in last 24 hours: Temp:  [95 F (35 C)-99 F (37.2 C)] 99 F (37.2 C) (04/30 0800) Pulse Rate:  [89-92] 90 (04/30 0800) Cardiac Rhythm:  [-] Atrial paced (04/29 2000) Resp:  [0-26] 1 (04/30 0800) BP: (71-111)/(44-82) 85/49 mmHg (04/30 0800) SpO2:  [92 %-100 %] 100 % (04/30 0800) Arterial Line BP: (80-115)/(51-90) 103/57 mmHg (04/30 0800) FiO2 (%):  [40 %-50 %] 40 % (04/29 1850) Weight:  [72 kg (158 lb 11.7 oz)] 72 kg (158 lb 11.7 oz) (04/29 1545)  Pre op weight  73 kg Current Weight  03/18/13 72 kg (158 lb 11.7 oz)    Hemodynamic parameters for last 24 hours: PAP: (28-42)/(15-24) 39/18 mmHg CO:  [3.7 L/min-5.8 L/min] 5.2 L/min CI:  [2 L/min/m2-3.2 L/min/m2] 2.9 L/min/m2  Intake/Output from previous day: 04/29 0701 - 04/30 0700 In: 6771.9 [I.V.:4921.9; Blood:250; IV Piggyback:1600] Out: 3870 [Urine:2940; Blood:500; Chest Tube:430]   Physical Exam:  Cardiovascular: RRR, no murmurs, gallops Pulmonary: Slighlty diminished at right base; no rales, wheezes, or rhonchi. Abdomen: Soft, non tender, bowel sounds present. Extremities: SCDs in place Wounds: Dressing is clean and dry.  Lab Results: CBC: Recent Labs  03/18/13 2042 03/19/13 0350  WBC 14.8* 12.4*  HGB 10.0* 9.0*  HCT 28.0* 25.6*  PLT 174 172   BMET:  Recent Labs  03/18/13 0510  03/18/13 2031 03/18/13 2042 03/19/13 0350  NA 137  < > 138  --  136  K 4.3  < > 4.6  --  4.2  CL 97  --  106  --  104  CO2 29  --   --   --  23  GLUCOSE 98  < > 163*  --  123*  BUN 19  --  13  --  12  CREATININE 1.25*  --  0.90 0.90 0.82  CALCIUM 10.2  --   --    --  8.2*  < > = values in this interval not displayed.  PT/INR:  Lab Results  Component Value Date   INR 1.56* 03/18/2013   INR 1.09 03/03/2013   ABG:  INR: Will add last result for INR, ABG once components are confirmed Will add last 4 CBG results once components are confirmed  Assessment/Plan:  1. CV - A paced at 90. Wean Neo Synephrine as tolerates. 2.  Pulmonary - Chest tubes 430 cc since surgery. Chest tubes to remain to suction. CXR this am shows atelectasis at bases, no pneumothorax. Encourage incentive spirometer. 3.  Acute blood loss anemia - H and H 9 and 25.6 4.Pre op HGA1C 5.5. Will continue accu checks while in ICU.  5.See progression orders  Ariel Simpson MPA-C 03/19/2013,8:37 AM

## 2013-03-19 NOTE — Progress Notes (Signed)
Awake and follows commands A paced rhythm. ST elevation on monitor. The a.m. EKG is unremarkable. Sinus bradycardia Multicomponent pericardial rub on auscultation. No obvious mitral regurgitation. Neuro is intact. Stable. Will follow and advise as needed. Selinda Eon 03/19/2013, 8:13 AM

## 2013-03-20 ENCOUNTER — Other Ambulatory Visit: Payer: Self-pay | Admitting: Primary Care

## 2013-03-20 ENCOUNTER — Inpatient Hospital Stay (HOSPITAL_COMMUNITY): Payer: BC Managed Care – PPO

## 2013-03-20 LAB — BASIC METABOLIC PANEL
BUN: 15 mg/dL (ref 6–23)
Calcium: 8.9 mg/dL (ref 8.4–10.5)
Creatinine, Ser: 0.96 mg/dL (ref 0.50–1.10)
GFR calc Af Amer: 74 mL/min — ABNORMAL LOW (ref >90)
GFR calc non Af Amer: 63 mL/min — ABNORMAL LOW (ref >90)
Glucose, Bld: 122 mg/dL — ABNORMAL HIGH (ref 70–99)
Potassium: 4.5 mEq/L (ref 3.5–5.1)

## 2013-03-20 LAB — CBC
Hemoglobin: 8.8 g/dL — ABNORMAL LOW (ref 12.0–15.0)
MCH: 31.2 pg (ref 26.0–34.0)
MCHC: 34.5 g/dL (ref 30.0–36.0)
RDW: 14.1 % (ref 11.5–15.5)

## 2013-03-20 LAB — GLUCOSE, CAPILLARY
Glucose-Capillary: 112 mg/dL — ABNORMAL HIGH (ref 70–99)
Glucose-Capillary: 116 mg/dL — ABNORMAL HIGH (ref 70–99)

## 2013-03-20 MED ORDER — ZOLPIDEM TARTRATE 5 MG PO TABS: 5.0000 mg | ORAL_TABLET | Freq: Every evening | ORAL | Status: DC | PRN

## 2013-03-20 MED ORDER — FUROSEMIDE 40 MG PO TABS
40.0000 mg | ORAL_TABLET | Freq: Two times a day (BID) | ORAL | Status: DC
  Administered 2013-03-21 – 2013-03-23 (×5): 40 mg via ORAL
  Filled 2013-03-20 (×7): qty 1

## 2013-03-20 MED ORDER — SODIUM CHLORIDE 0.9 % IV SOLN: 250.0000 mL | INTRAVENOUS | Status: DC | PRN

## 2013-03-20 MED ORDER — SODIUM CHLORIDE 0.9 % IJ SOLN: 3.0000 mL | INTRAMUSCULAR | Status: DC | PRN

## 2013-03-20 MED ORDER — ATORVASTATIN CALCIUM 20 MG PO TABS
20.0000 mg | ORAL_TABLET | Freq: Every day | ORAL | Status: DC
  Administered 2013-03-20 – 2013-03-23 (×4): 20 mg via ORAL
  Filled 2013-03-20 (×4): qty 1

## 2013-03-20 MED ORDER — WARFARIN - PHYSICIAN DOSING INPATIENT: Freq: Every day | Status: DC

## 2013-03-20 MED ORDER — AMIODARONE HCL 200 MG PO TABS
200.0000 mg | ORAL_TABLET | Freq: Two times a day (BID) | ORAL | Status: DC
  Administered 2013-03-20 (×2): 200 mg via ORAL
  Filled 2013-03-20 (×4): qty 1

## 2013-03-20 MED ORDER — WARFARIN SODIUM 2.5 MG PO TABS
2.5000 mg | ORAL_TABLET | Freq: Every day | ORAL | Status: DC
  Administered 2013-03-20 – 2013-03-22 (×3): 2.5 mg via ORAL
  Filled 2013-03-20 (×4): qty 1

## 2013-03-20 MED ORDER — ASPIRIN EC 81 MG PO TBEC
81.0000 mg | DELAYED_RELEASE_TABLET | Freq: Every day | ORAL | Status: DC
  Administered 2013-03-21 – 2013-03-23 (×3): 81 mg via ORAL
  Filled 2013-03-20 (×4): qty 1

## 2013-03-20 MED ORDER — FUROSEMIDE 10 MG/ML IJ SOLN
20.0000 mg | Freq: Once | INTRAMUSCULAR | Status: AC
  Administered 2013-03-20: 20 mg via INTRAVENOUS

## 2013-03-20 MED ORDER — SODIUM CHLORIDE 0.9 % IJ SOLN
3.0000 mL | Freq: Two times a day (BID) | INTRAMUSCULAR | Status: DC
  Administered 2013-03-20 – 2013-03-23 (×7): 3 mL via INTRAVENOUS

## 2013-03-20 MED ORDER — ASPIRIN EC 81 MG PO TBEC: 81.0000 mg | DELAYED_RELEASE_TABLET | Freq: Every day | ORAL | Status: DC

## 2013-03-20 MED ORDER — ALPRAZOLAM 0.25 MG PO TABS: 0.2500 mg | ORAL_TABLET | Freq: Four times a day (QID) | ORAL | Status: DC | PRN

## 2013-03-20 MED ORDER — FUROSEMIDE 10 MG/ML IJ SOLN
INTRAMUSCULAR | Status: AC
  Filled 2013-03-20: qty 4

## 2013-03-20 MED ORDER — MOVING RIGHT ALONG BOOK
Freq: Once | Status: AC
  Administered 2013-03-20: 10:00:00
  Filled 2013-03-20: qty 1

## 2013-03-20 MED ORDER — TRAMADOL HCL 50 MG PO TABS
50.0000 mg | ORAL_TABLET | ORAL | Status: DC | PRN
  Administered 2013-03-20 – 2013-03-21 (×2): 100 mg via ORAL
  Filled 2013-03-20 (×2): qty 2

## 2013-03-20 MED ORDER — POTASSIUM CHLORIDE CRYS ER 20 MEQ PO TBCR
20.0000 meq | EXTENDED_RELEASE_TABLET | Freq: Two times a day (BID) | ORAL | Status: DC
  Administered 2013-03-21 – 2013-03-23 (×5): 20 meq via ORAL
  Filled 2013-03-20 (×6): qty 1

## 2013-03-20 NOTE — Progress Notes (Signed)
Reported off to [facility], RN on unit 2000. Patient to be transferred  to room 2018.   Arterial line, central line and foley cath have all been removed as ordered. No acute distress noted. VSS. Safety maintained. All belongings and meds sent with patient to room 2018 and given to receiving nurse.

## 2013-03-20 NOTE — Progress Notes (Signed)
   CARDIOTHORACIC SURGERY PROGRESS NOTE   R2 Days Post-Op Procedure(s) (LRB): MINIMALLY INVASIVE MITRAL VALVE REPAIR (MVR) (Right) INTRAOPERATIVE TRANSESOPHAGEAL ECHOCARDIOGRAM (N/A) MAZE (N/A)  Subjective: Feels much better than yesterday.  Ambulating fairly well on room air.  Mild soreness in chest.  Marginal appetite  Objective: Vital signs: BP Readings from Last 1 Encounters:  03/20/13 95/62   Pulse Readings from Last 1 Encounters:  03/20/13 90   Resp Readings from Last 1 Encounters:  03/20/13 14   Temp Readings from Last 1 Encounters:  03/20/13 98.4 F (36.9 C) Oral    Hemodynamics: PAP: (34-35)/(14) 34/14 mmHg  Physical Exam:  Rhythm:   NSR - AAI paced  Breath sounds: clear  Heart sounds:  RRR w/out murmur  Incisions:  Dressings dry, intact  Abdomen:  soft  Extremities:  Warm, swollen   Intake/Output from previous day: 04/30 0701 - 05/01 0700 In: 1338.9 [P.O.:540; I.V.:698.9; IV Piggyback:100] Out: 1040 [Urine:720; Chest Tube:320] Intake/Output this shift: Total I/O In: 20 [I.V.:20] Out: 30 [Urine:30]  Lab Results:  Recent Labs  03/19/13 1700 03/19/13 1731 03/20/13 0407  WBC 15.4*  --  13.3*  HGB 8.8* 9.2* 8.8*  HCT 25.5* 27.0* 25.5*  PLT 170  --  158   BMET:  Recent Labs  03/19/13 0350  03/19/13 1731 03/20/13 0407  NA 136  --  136 135  K 4.2  --  4.2 4.5  CL 104  --  100 102  CO2 23  --   --  25  GLUCOSE 123*  --  154* 122*  BUN 12  --  11 15  CREATININE 0.82  < > 0.80 0.96  CALCIUM 8.2*  --   --  8.9  < > = values in this interval not displayed.  CBG (last 3)   Recent Labs  03/19/13 2336 03/20/13 0343 03/20/13 0808  GLUCAP 116* 106* 126*   ABG    Component Value Date/Time   PHART 7.403 03/18/2013 2035   HCO3 23.2 03/18/2013 2035   TCO2 24 03/19/2013 1731   ACIDBASEDEF 1.0 03/18/2013 2035   O2SAT 95.0 03/18/2013 2035   CXR: *RADIOLOGY REPORT*  Clinical Data: Status post mitral valvuloplasty and Maze procedure.    PORTABLE CHEST - 1 VIEW  Comparison: Chest 03/20/2013.  Findings: Swan-Ganz catheter has been removed. Left IJ sheath  remains in place. Right chest tube and pericardial drain remain in  place. Bibasilar subsegmental atelectasis persists without marked  change. No pneumothorax is identified. There is cardiomegaly.  IMPRESSION:  1. Status post removal of Swan-Ganz catheter.  2. Right chest tube and pericardial drain. Negative for  pneumothorax.  3. No change in bibasilar atelectasis.  Original Report Authenticated By: Holley Dexter, M.D.   Assessment/Plan: S/P Procedure(s) (LRB): MINIMALLY INVASIVE MITRAL VALVE REPAIR (MVR) (Right) INTRAOPERATIVE TRANSESOPHAGEAL ECHOCARDIOGRAM (N/A) MAZE (N/A)  Doing well POD2 Maintaining NSR/AAI paced rhythm Vasodilated with BP still soft but improved, now almost off Neo drip Expected post op acute blood loss anemia, mild, stable Expected post op volume excess, moderate Expected post op atelectasis, stable   Mobilize  Continue AAI pacing until BP improved  Keep chest tubes one more day  Amiodarone  Coumadin  Transfer step down   OWEN,CLARENCE H 03/20/2013 9:12 AM

## 2013-03-20 NOTE — Progress Notes (Signed)
CARDIAC REHAB PHASE I   PRE:  Rate/Rhythm: 80 atrial paced  BP:  Supine:   Sitting: 95/63  Standing:    SaO2: 92%RA  MODE:  Ambulation: 150 ft   POST:  Rate/Rhythm: 80 paced  BP:  Supine:   Sitting: 108/72  Standing:    SaO2: 96%RA 1430-1510 Pt not feeling well. Sleepy. Walked 150 ft on RA with rolling walker and asst x 1 with very slow pace. Stopped frequently to rest due to weakness and nausea. Back to recliner after walk. Call bell in reach. All equipment intact.   Luetta Nutting, RN BSN  03/20/2013 3:03 PM

## 2013-03-20 NOTE — Progress Notes (Addendum)
Pt transferred from 2300. Pt is stable and resting in chair. Per RN on 2300, pt ambulated for first time today on 2300. Tolerated well.

## 2013-03-20 NOTE — Progress Notes (Signed)
Feels stronger. BP still low requiring pressors.  A paced. ECG non ischemic

## 2013-03-21 LAB — BASIC METABOLIC PANEL
GFR calc Af Amer: 75 mL/min — ABNORMAL LOW (ref >90)
GFR calc non Af Amer: 64 mL/min — ABNORMAL LOW (ref >90)
Glucose, Bld: 101 mg/dL — ABNORMAL HIGH (ref 70–99)
Potassium: 4.4 mEq/L (ref 3.5–5.1)
Sodium: 134 mEq/L — ABNORMAL LOW (ref 135–145)

## 2013-03-21 LAB — TYPE AND SCREEN
ABO/RH(D): A POS
Antibody Screen: NEGATIVE
Unit division: 0

## 2013-03-21 LAB — CBC
Hemoglobin: 8.4 g/dL — ABNORMAL LOW (ref 12.0–15.0)
MCHC: 34.7 g/dL (ref 30.0–36.0)
RDW: 13.8 % (ref 11.5–15.5)

## 2013-03-21 LAB — PROTIME-INR: INR: 1.18 (ref 0.00–1.49)

## 2013-03-21 LAB — GLUCOSE, CAPILLARY: Glucose-Capillary: 101 mg/dL — ABNORMAL HIGH (ref 70–99)

## 2013-03-21 MED ORDER — PATIENT'S GUIDE TO USING COUMADIN BOOK
Freq: Once | Status: AC
  Administered 2013-03-21: 10:00:00
  Filled 2013-03-21: qty 1

## 2013-03-21 MED ORDER — AMIODARONE HCL 200 MG PO TABS
200.0000 mg | ORAL_TABLET | Freq: Every day | ORAL | Status: DC
  Administered 2013-03-21 – 2013-03-23 (×3): 200 mg via ORAL
  Filled 2013-03-21 (×3): qty 1

## 2013-03-21 MED ORDER — WARFARIN VIDEO
Freq: Once | Status: AC
  Administered 2013-03-21: 10:00:00

## 2013-03-21 NOTE — Discharge Summary (Signed)
Physician Discharge Summary  Patient ID: Ariel Simpson MRN: [id number] DOB/AGE: 59/31/1957 59 y.o.  Admit date: 03/12/2013 Discharge date: 03/24/2013  Admission Diagnoses: 1. Severe mitral regurgitation 2. Recurrent paroxysmal atrial fibrillation 3. History of acute diastolic heart failure 4.History of hyperlipidemia 5.History of paroxysmal supraventricular tachycardia 6.History of PONV  Discharge Diagnoses:  1. Severe mitral regurgitation 2. Recurrent paroxysmal atrial fibrillation 3. History of acute diastolic heart failure 4.History of hyperlipidemia 5.History of paroxysmal supraventricular tachycardia 6.History of PONV 7.ABL anemia 8.Mild thrombocytopenia   Procedure (s):  Minimally-Invasive Mitral Valve Repair Complex valvuloplasty including triangular resection of posterior leaflet  Artificial Gore-tex neocord placement x4  Sorin Memo 3D Ring Annuloplasty (size 30mm, catalog #[id number], serial #[id number])  Maze Procedure Complete biatrial lesion set using cryothermy and bipolar radiofrequency ablation  Oversewing of Left Atrial Appendage by Dr. Cornelius Moras on 03/18/2013.  History of Presenting Illness: 1. Severe mitral regurgitation 2. Recurrent paroxysmal atrial fibrillation 3. History of acute diastolic heart failure 4.History of hyperlipidemia 5.History of paroxysmal supraventricular tachycardia 6.History of PONV  History of Presenting Illness: This is a 57 year old married white female from [place] with known history of mitral valve prolapse and paroxysmal supraventricular tachycardia dating back more than 10 years. The patient states that she first developed severe symptomatic tachypalpitations between 10 and 15 years ago. She was diagnosed with paroxysmal supraventricular tachycardia, and at that time an echocardiogram was performed demonstrating mitral valve prolapse with mitral regurgitation. She has not had a followup echocardiogram since then until approximately 2 weeks  ago when she developed fairly abrupt onset of severe exertional shortness of breath with chest tightness, dry cough, lower extremity edema and orthopnea. She presented to the [facility] walk-in clinic and was promptly sent to the emergency department where she was diagnosed with acute diastolic congestive heart failure. She was admitted to the hospital and transthoracic echocardiogram confirmed the presence of mitral valve prolapse with flail segment of the posterior leaflet of the mitral valve and severe mitral regurgitation. Left ventricular systolic function appear preserved. She underwent left and right heart catheterization by Dr. Katrinka Blazing documenting the presence of normal coronary artery anatomy with no significant coronary artery disease. There was normal left ventricular systolic function with moderate pulmonary hypertension. The patient's symptoms improved with diuretic therapy. She did develop a transient episode of paroxysmal atrial fibrillation for which she was treated with amiodarone. She was discharged from the hospital and referred for elective surgical consultation. Transesophageal echocardiogram has been scheduled for tomorrow.  The patient reports that since going home she still has significant exertional shortness of breath and orthopnea but she is improved from how she was at the time of hospital admission. Prior to 2 weeks ago, she had not previously experienced any problems of shortness of breath with activity or at rest. Up until recently, she has lived a very active lifestyle and enjoys hiking, walking, angiogram on a regular basis. She has had frequent tachypalpitations off and on for more than 10 years. She has never had any dizzy spells nor syncope. She was seen in the office by Dr. Cornelius Moras on 4/22 for the consideration of mitral valve repair vs replacement and Maze procedure. Potential risks, complications, and benefits of the surgery were discussed with the patient and he agreed to proceed. She  was going to be scheduled for 4/29.       However, she developed shortness of breath, nervousness, and orthopnea. She presented to [facility] ER on 4/22. Her chest x-ray showed no significant change from prior. She  had cardiomegaly with central vascular congestion and mild edema. Dr. Anne Fu spoke personally with the ER physician taking care of her and at that time, he did not feel as though she needed to be admitted. She had planned endoscopy/TEE on 4/23 at noon and in anticipation of this procedure, her O2 sats were noted to be 88% and she was having some dyspnea at rest. Dr. Anne Fu was called to evaluate her prior to procedure and decided to cancel this elective procedure.Her husband states that she has had some shortness of breath off-and-on since the diagnosis was made. She has not had any sleep at home. She is nervous about taking Ambien. She's not having any chest discomfort. She did have some audible crackles she states. No recent fevers, chills. No bleeding. She received IV Lasix. PFTs were done. In addition, she had a CTA as well. Pre operative duplex US showed no significant internal carotid artery stenosis. She remained stable. She underwent a mini mitral valve repair and Maze on 4/29.   Brief Hospital Course:  Her postoperative course was initially notable for bradycardia and hypotension.  She was atrially paced, and beta blocker was not started.  Her blood pressures slowly improved, and she was restarted on Amiodarone.  Her pacer was discontinued as her heart rate improved.  Her chest tubes were removed in the standard fashion on postop day 3, and follow up chest x-rays have remained stable.  She was started on Coumadin, and her INR has been slowly trending up.  She is ambulating independently and is tolerating a regular diet.  She was evaluated on today's date and is ready for discharge home.    Latest Vital Signs: Blood pressure 124/82, pulse 79, temperature 97.8 F (36.6 C), temperature  source Oral, resp. rate 17, height 5\' 6"  (1.676 m), weight 80.4 kg (177 lb 4 oz), SpO2 96.00%.  Physical Exam: Heart: RRR Lungs: Decreased BS in bases  Wound: Clean and dry  Extremities: Trace LE edema   Discharge Condition:Stable  Recent laboratory studies:  Lab Results  Component Value Date   WBC 8.2 03/21/2013   HGB 8.4* 03/21/2013   HCT 24.2* 03/21/2013   MCV 89.6 03/21/2013   PLT 139* 03/21/2013   Lab Results  Component Value Date   INR 1.29 03/23/2013   INR 1.27 03/22/2013   INR 1.18 03/21/2013      Diagnostic Studies: Chest x-ray  03/22/2013  Findings: Left IJ vascular sheath and right chest tubes have been removed. Epicardial pacing wires remain. Mitral valve replacement noted. Stable cardiomegaly with vascular congestion. Improving basilar atelectasis pattern with residual small effusions. No pneumothorax. Trachea is midline.  IMPRESSION:  Improving basilar atelectasis. Residual small bilateral pleural  effusions.     Ct Angio Chest Aortic Dissect W &/or W/o  03/13/2013  *RADIOLOGY REPORT*  Clinical Data:  Mitral valve disease.  Evaluate for vascular pathology.  CT ANGIOGRAPHY CHEST, ABDOMEN AND PELVIS  Technique:  Multidetector CT imaging through the chest, abdomen and pelvis was performed using the standard protocol during bolus administration of intravenous contrast.  Multiplanar reconstructed images including MIPs were obtained and reviewed to evaluate the vascular anatomy.  Contrast: OMNIPAQUE IOHEXOL 350 MG/ML SOLN  Comparison:   None.  CTA CHEST  Findings:  The aorta is nonaneurysmal and patent within the chest. No evidence of aortic dissection or intramural hematoma.  Minimal vascular calcification in the aortic arch.  Great vessels are patent.  Thyroid gland is unremarkable.  Negative abnormal mediastinal adenopathy by measurement  criteria. The left atrium is moderately dilated.  No significant aortic or mitral valve calcifications are seen.  Mildly prominent  peribronchovascular soft tissue thickening in the hilar regions is present likely inflated to chronic inflammatory changes.  Very small bilateral pleural effusions right greater than left are present.  There is dependent atelectasis associated with the effusions. Tiny visceral pleural nodes on the major fissures. Nonspecific patchy ground-glass opacities are present in the right upper lobe on images 32 and 62.  No vertebral compression deformities.  Nonspecific sclerotic focus is present in the posterior and inferior T6 vertebral body.  No other sclerotic lesions are evident and this is of unknown significance.   Review of the MIP images confirms the above findings.  IMPRESSION: No acute pathology involving the aorta.  Specifically no evidence of aortic aneurysm, aortic dissection, or intramural hematoma.  Left atrium is dilated consistent with mitral valvular disease.  Bilateral pleural effusions and bibasilar atelectasis.  Patchy ground-glass in the right upper lobe as described. Adenocarcinoma cannot be excluded.  Followup by CT is recommended in 12 months, with continued annual surveillance for a minimum of 3 years.  These recommendations are taken from "Recommendations for the Management of Subsolid Pulmonary Nodules Detected at CT:  A Statement from the Fleischner Society" Radiology 2013; 266:1, 304- 317.  Nonspecific solitary sclerotic focus in T6 as described.  CTA ABDOMEN AND PELVIS  Findings:  The aorta is nonaneurysmal and patent without evidence of dissection.  Minimal vascular calcification of the aorta.  The right renal artery has a a beaded appearance that is characteristic of fibromuscular dysplasia.  No evidence of narrowing at its origin.  The left renal artery is unaffected and within normal limits.  Celiac axis is patent.  Branch vessels are patent without evidence of vasculitis.  SMA is patent.  Branch vessels are grossly patent.  IMA is patent.  Branch vessels are grossly patent.  Mild  tortuosity of the iliac arteries.  Bilateral common, internal, and external iliac arteries are nonaneurysmal and patent.  Tiny benign-appearing cyst in the right lobe of the liver.  Spleen, adrenal glands, pancreas, kidneys, gallbladder are within normal limits.  Bladder is unremarkable.  Uterus is absent.  Adnexa are within normal limits.  No free fluid.  No abnormal adenopathy. Small left para-aortic lymph nodes adjacent to the left kidney.  No vertebral compression deformity.  Severe narrowing at L5 S1 with vacuum.  Lumbar facet arthropathy at L3-4, L4-5, and L5-S1.   Review of the MIP images confirms the above findings.  IMPRESSION: No evidence of aortic aneurysm or aortic dissection.  Findings compatible with fibromuscular dysplasia of the right renal artery.   Original Report Authenticated By: Jolaine Click, M.D.    Ct Angio Abd/pel W/ And/or W/o  03/13/2013  *RADIOLOGY REPORT*  Clinical Data:  Mitral valve disease.  Evaluate for vascular pathology.  CT ANGIOGRAPHY CHEST, ABDOMEN AND PELVIS  Technique:  Multidetector CT imaging through the chest, abdomen and pelvis was performed using the standard protocol during bolus administration of intravenous contrast.  Multiplanar reconstructed images including MIPs were obtained and reviewed to evaluate the vascular anatomy.  Contrast: OMNIPAQUE IOHEXOL 350 MG/ML SOLN  Comparison:   None.  CTA CHEST  Findings:  The aorta is nonaneurysmal and patent within the chest. No evidence of aortic dissection or intramural hematoma.  Minimal vascular calcification in the aortic arch.  Great vessels are patent.  Thyroid gland is unremarkable.  Negative abnormal mediastinal adenopathy by measurement criteria. The left  atrium is moderately dilated.  No significant aortic or mitral valve calcifications are seen.  Mildly prominent peribronchovascular soft tissue thickening in the hilar regions is present likely inflated to chronic inflammatory changes.  Very small bilateral  pleural effusions right greater than left are present.  There is dependent atelectasis associated with the effusions. Tiny visceral pleural nodes on the major fissures. Nonspecific patchy ground-glass opacities are present in the right upper lobe on images 32 and 62.  No vertebral compression deformities.  Nonspecific sclerotic focus is present in the posterior and inferior T6 vertebral body.  No other sclerotic lesions are evident and this is of unknown significance.   Review of the MIP images confirms the above findings.  IMPRESSION: No acute pathology involving the aorta.  Specifically no evidence of aortic aneurysm, aortic dissection, or intramural hematoma.  Left atrium is dilated consistent with mitral valvular disease.  Bilateral pleural effusions and bibasilar atelectasis.  Patchy ground-glass in the right upper lobe as described. Adenocarcinoma cannot be excluded.  Followup by CT is recommended in 12 months, with continued annual surveillance for a minimum of 3 years.  These recommendations are taken from "Recommendations for the Management of Subsolid Pulmonary Nodules Detected at CT:  A Statement from the Fleischner Society" Radiology 2013; 266:1, 304- 317.  Nonspecific solitary sclerotic focus in T6 as described.  CTA ABDOMEN AND PELVIS  Findings:  The aorta is nonaneurysmal and patent without evidence of dissection.  Minimal vascular calcification of the aorta.  The right renal artery has a a beaded appearance that is characteristic of fibromuscular dysplasia.  No evidence of narrowing at its origin.  The left renal artery is unaffected and within normal limits.  Celiac axis is patent.  Branch vessels are patent without evidence of vasculitis.  SMA is patent.  Branch vessels are grossly patent.  IMA is patent.  Branch vessels are grossly patent.  Mild tortuosity of the iliac arteries.  Bilateral common, internal, and external iliac arteries are nonaneurysmal and patent.  Tiny benign-appearing cyst in  the right lobe of the liver.  Spleen, adrenal glands, pancreas, kidneys, gallbladder are within normal limits.  Bladder is unremarkable.  Uterus is absent.  Adnexa are within normal limits.  No free fluid.  No abnormal adenopathy. Small left para-aortic lymph nodes adjacent to the left kidney.  No vertebral compression deformity.  Severe narrowing at L5 S1 with vacuum.  Lumbar facet arthropathy at L3-4, L4-5, and L5-S1.   Review of the MIP images confirms the above findings.  IMPRESSION: No evidence of aortic aneurysm or aortic dissection.  Findings compatible with fibromuscular dysplasia of the right renal artery.   Original Report Authenticated By: Jolaine Click, M.D.         Future Appointments Provider Department Dept Phone   03/31/2013 1:00 PM [facility] [place] [phone]      Discharge Medications:   Medication List    STOP taking these medications       metoprolol succinate 50 MG 24 hr tablet  Commonly known as:  TOPROL-XL      TAKE these medications       amiodarone 200 MG tablet  Commonly known as:  PACERONE  Take 1 tablet (200 mg total) by mouth daily.     aspirin 81 MG EC tablet  Take 1 tablet (81 mg total) by mouth daily.     atorvastatin 20 MG tablet  Commonly known as:  LIPITOR  Take 20 mg by mouth  daily.     furosemide 40 MG tablet  Commonly known as:  LASIX  Take 1 tablet (40 mg total) by mouth daily. X 1 week     oxyCODONE 5 MG immediate release tablet  Commonly known as:  Oxy IR/ROXICODONE  Take 1-2 tablets (5-10 mg total) by mouth every 3 (three) hours as needed for pain.     potassium chloride SA 20 MEQ tablet  Commonly known as:  K-DUR,KLOR-CON  Take 1 tablet (20 mEq total) by mouth daily. X 1 week     warfarin 2.5 MG tablet  Commonly known as:  COUMADIN  Take 1 tablet (2.5 mg total) by mouth daily at 6 PM.         The patient has been discharged on:   1.Beta Blocker:  Yes [   ]                               No   [  x ]                              If No, reason: bradycardia  2.Ace Inhibitor/ARB: Yes [   ]                                     No  [   x ]                                     If No, reason: LVEF preserved  3.Statin:   Yes [  x ]                  No  [   ]                  If No, reason:  4.Ecasa:  Yes  [  x ]                  No   [   ]                  If No, reason:     Follow Up Appointments: Follow-up Information   Follow up with [name], MD. (Call for a follow up appointment for 2 weeks)    Contact information:   [address] [place] [address] [phone]       Follow up with Purcell Nails, MD. (PA/LAT CXR to be taken (at [facility] which is in the same buildin as Dr. Orvan July office) on 03/31/2013 at 12:00 pm;Appointment with physician assistant is on 03/31/2013)    Contact information:   [address] [employer] [address] [place] [address] [phone]       Follow up On 03/25/2013. (Call Dr. Michaelle Copas office to have bloodwork (PT/INR) checked for Coumadin monitoring)       Signed: ZIMMERMAN,DONIELLE MPA-C 03/21/2013, 1:05 PM

## 2013-03-21 NOTE — Progress Notes (Addendum)
[address]            [name] [address]          [phone]     3 Days Post-Op Procedure(s) (LRB): MINIMALLY INVASIVE MITRAL VALVE REPAIR (MVR) (Right) INTRAOPERATIVE TRANSESOPHAGEAL ECHOCARDIOGRAM (N/A) MAZE (N/A)  Subjective: C/o nausea, which she thinks may be related to pain meds.  She was able to eat a small amount for breakfast.  Breathing stable.   Objective: Vital signs in last 24 hours: Patient Vitals for the past 24 hrs:  BP Temp Temp src Pulse Resp SpO2 Weight  03/21/13 0456 124/82 mmHg 97.8 F (36.6 C) Oral 79 17 96 % 177 lb 4 oz (80.4 kg)  03/20/13 2020 96/58 mmHg 98.4 F (36.9 C) Oral 78 16 96 % -  03/20/13 1331 101/63 mmHg - - 80 13 - -  03/20/13 1232 - 98.3 F (36.8 C) Oral - - - -  03/20/13 1230 94/61 mmHg - - 90 - 96 % -  03/20/13 1200 106/64 mmHg - - 89 12 100 % -  03/20/13 1145 102/67 mmHg - - 90 - 100 % -  03/20/13 1130 103/68 mmHg - - 89 16 99 % -  03/20/13 1115 97/65 mmHg - - 90 - 95 % -  03/20/13 1100 95/66 mmHg - - 90 - 95 % -  03/20/13 1045 96/66 mmHg - - 89 - 96 % -  03/20/13 1030 111/72 mmHg - - - 23 - -  03/20/13 1000 98/64 mmHg - - 90 - 95 % -  03/20/13 0945 101/65 mmHg - - 90 - 96 % -  03/20/13 0930 111/70 mmHg - - 90 19 100 % -  03/20/13 0915 101/67 mmHg - - 90 12 99 % -  03/20/13 0900 101/67 mmHg - - 91 12 99 % -  03/20/13 0811 - 98.4 F (36.9 C) Oral - - - -   Current Weight  03/21/13 177 lb 4 oz (80.4 kg)     Intake/Output from previous day: 05/01 0701 - 05/02 0700 In: 454.6 [P.O.:410; I.V.:44.6] Out: 1560 [Urine:1390; Chest Tube:170]  CBGs 95-125-112-110-85-101   PHYSICAL EXAM:  Heart: RRR, 60 under pacer Lungs: Decreased BS in bases Wound: Clean and dry Extremities: Trace LE edema     Lab Results: CBC: Recent Labs  03/20/13 0407 03/21/13 0540  WBC 13.3* 8.2  HGB 8.8* 8.4*  HCT 25.5* 24.2*  PLT 158 139*   BMET:  Recent Labs  03/20/13 0407 03/21/13 0540  NA 135  134*  K 4.5 4.4  CL 102 98  CO2 25 27  GLUCOSE 122* 101*  BUN 15 17  CREATININE 0.96 0.95  CALCIUM 8.9 8.8    PT/INR:  Recent Labs  03/21/13 0540  LABPROT 14.8  INR 1.18      Assessment/Plan: S/P Procedure(s) (LRB): MINIMALLY INVASIVE MITRAL VALVE REPAIR (MVR) (Right) INTRAOPERATIVE TRANSESOPHAGEAL ECHOCARDIOGRAM (N/A) MAZE (N/A)  CV- BPs still borderline, HR 60 under pacer.  Continue pacing for now. On low dose Amio. Coumadin started.  Vol overload- diurese.  CT output decreased.  Hopefully can d/c CT soon.  GI- nausea, thought to be related to pain meds.  Minimize narcotics and watch.   CRPI, pulm toilet.     LOS: 9 days    COLLINS,GINA H 03/21/2013  I have seen and examined the patient and agree with the assessment and  plan as outlined.  D/C chest tubes.  Mobilize.  Rhythm stable NSR 60 under pacer but still w/ soft BP - will continue pacing today but wean it off.  Possibly ready for d/c home in 2-3 days depending on progress  OWEN,CLARENCE H 03/21/2013 9:12 AM

## 2013-03-21 NOTE — Progress Notes (Signed)
CARDIAC REHAB PHASE I   PRE:  Rate/Rhythm: 80 paced    BP: sitting 98/70    SaO2: 96 RA  MODE:  Ambulation: 150 ft   POST:  Rate/Rhythm: 80 paced    BP: sitting 97/60     SaO2: 95 RA  Pt sts she slept well but still c/o nausea, dizziness, in general puniness. Pale walking. Could not increase distance. Stated her head was spinning during walk. To recliner after walk with VSS. Encouraged x2 more walks today.  [id number]  Elissa Lovett [place] CES, ACSM 03/21/2013 9:05 AM

## 2013-03-21 NOTE — Progress Notes (Signed)
Progress and nicely but still with low blood pressure.  No atrial arrhythmias.  Continue to follow and advise as needed

## 2013-03-21 NOTE — Progress Notes (Signed)
Pt ambulated approx. 220 feet with rolling walker. Pt tolerated well and did not need to stop for breaks. Pt back to chair with call bell in reach.

## 2013-03-21 NOTE — Progress Notes (Signed)
Discontinued pt's chest tubes along with on-cue pump per MD order. Also removed neck sutures (per MD verbal order). Pt tolerated well. Tied chest tube sutures and placed occlusive dressing over site. Pt is resting in bed with call bell in reach.

## 2013-03-22 ENCOUNTER — Inpatient Hospital Stay (HOSPITAL_COMMUNITY): Payer: BC Managed Care – PPO

## 2013-03-22 LAB — PROTIME-INR
INR: 1.27 (ref 0.00–1.49)
Prothrombin Time: 15.6 seconds — ABNORMAL HIGH (ref 11.6–15.2)

## 2013-03-22 NOTE — Progress Notes (Signed)
Pt amb 521ft with rolling walker. Pt with no complaints. Will continue to monitor pt closely.

## 2013-03-22 NOTE — Progress Notes (Signed)
Pt amb 500 ft with walker. Pt with no complaints. Will continue to monitor pt closely.

## 2013-03-22 NOTE — Progress Notes (Addendum)
CARDIAC REHAB PHASE I   PRE:  Rate/Rhythm: 66 sinus   BP:  Supine:   Sitting: 103/51  Standing:    SaO2: 97% ra  MODE:  Ambulation: 250 ft   POST:  Rate/Rhythem: 68 sinus  BP:  Supine:   Sitting: 110/68  Standing:    SaO2: 97% 0911-1000 Pt ambulated without difficulty using rolling walker x1 assist.  Slow steady gait.  Asymptomatic.  Education completed.  Oriented to CRPII  At pt request referral will be sent to [facility] outpatient CRP.  Ariel Simpson, [place]

## 2013-03-22 NOTE — Progress Notes (Signed)
Report given to Delice Bison, RN, Pt moved to regular 2000 bed.

## 2013-03-22 NOTE — Progress Notes (Addendum)
                    [address]            [place] [address]          [phone]     4 Days Post-Op Procedure(s) (LRB): MINIMALLY INVASIVE MITRAL VALVE REPAIR (MVR) (Right) INTRAOPERATIVE TRANSESOPHAGEAL ECHOCARDIOGRAM (N/A) MAZE (N/A)  Subjective: Nausea resolved and eating better.  Pain controlled. Some nonproductive cough overnight, rested poorly.   Objective: Vital signs in last 24 hours: Patient Vitals for the past 24 hrs:  BP Temp Temp src Pulse Resp SpO2 Weight  03/22/13 0526 100/60 mmHg 98.5 F (36.9 C) Oral 70 18 94 % 172 lb 4.8 oz (78.155 kg)  03/21/13 2016 110/67 mmHg 99.1 F (37.3 C) Oral 70 18 97 % -  03/21/13 1500 112/62 mmHg - - - - - -   Current Weight  03/22/13 172 lb 4.8 oz (78.155 kg)  03/21/13          177 lb 4 oz (80.4 kg)    Intake/Output from previous day: 05/02 0701 - 05/03 0700 In: 480 [P.O.:480] Out: 1250 [Urine:1250]    PHYSICAL EXAM:  Heart: RRR Lungs: Clear Wound: Clean and dry Extremities: Mild LE edema    Lab Results: CBC: Recent Labs  03/20/13 0407 03/21/13 0540  WBC 13.3* 8.2  HGB 8.8* 8.4*  HCT 25.5* 24.2*  PLT 158 139*   BMET:  Recent Labs  03/20/13 0407 03/21/13 0540  NA 135 134*  K 4.5 4.4  CL 102 98  CO2 25 27  GLUCOSE 122* 101*  BUN 15 17  CREATININE 0.96 0.95  CALCIUM 8.9 8.8    PT/INR:  Recent Labs  03/22/13 0511  LABPROT 15.6*  INR 1.27    CXR: Findings: Left IJ vascular sheath and right chest tubes have been removed. Epicardial pacing wires remain. Mitral valve replacement noted. Stable cardiomegaly with vascular congestion. Improving basilar atelectasis pattern with residual small effusions. No pneumothorax. Trachea is midline.  IMPRESSION:  Improving basilar atelectasis. Residual small bilateral pleural  effusions.    Assessment/Plan: S/P Procedure(s) (LRB): MINIMALLY INVASIVE MITRAL VALVE REPAIR (MVR) (Right) INTRAOPERATIVE TRANSESOPHAGEAL ECHOCARDIOGRAM  (N/A) MAZE (N/A) CV- BPs improved, HR 60-70 under pacer. Will d/c pacer, roll and tape EPWs. Continue Amio, Coumadin.  Vol overload- diurese.  CRPI, pulm toilet. Hopefully home 1-2 days if remains stable.    LOS: 10 days    COLLINS,GINA H 03/22/2013  I have seen and examined Cheryl Flash and agree with the above assessment  and plan.  Delight Ovens MD Beeper [phone] Office [phone] 03/22/2013 11:11 AM

## 2013-03-23 LAB — PROTIME-INR: INR: 1.29 (ref 0.00–1.49)

## 2013-03-23 MED ORDER — WARFARIN SODIUM 2.5 MG PO TABS: 2.5000 mg | ORAL_TABLET | Freq: Every day | ORAL | Status: DC

## 2013-03-23 MED ORDER — FUROSEMIDE 40 MG PO TABS: 40.0000 mg | ORAL_TABLET | Freq: Every day | ORAL | Status: DC

## 2013-03-23 MED ORDER — OXYCODONE HCL 5 MG PO TABS: 5.0000 mg | ORAL_TABLET | ORAL | Status: DC | PRN

## 2013-03-23 MED ORDER — AMIODARONE HCL 200 MG PO TABS: 200.0000 mg | ORAL_TABLET | Freq: Every day | ORAL | Status: DC

## 2013-03-23 MED ORDER — POTASSIUM CHLORIDE CRYS ER 20 MEQ PO TBCR: 20.0000 meq | EXTENDED_RELEASE_TABLET | Freq: Every day | ORAL | Status: DC

## 2013-03-23 NOTE — Progress Notes (Signed)
Assessment unchanged. Discussed D/C instructions with pt and family including medications and f/u appointments. Verbalized understanding. RX and appointment card given to pt. IV and tele removed. Pt left via W/C accompanied by RN.

## 2013-03-23 NOTE — Progress Notes (Signed)
EPW D/C per protocol. Pt tolerate procedure well. Pt instructed of BR, Frequent VS, and to alert staff with any CP or SOB. Verbalized understanding. Centralized tele alerted of procedure and told to notify of any arrythmias. VSS. Will continue to monitor pt closely.

## 2013-03-23 NOTE — Progress Notes (Signed)
Pt amb 579ft with no assistance. Pt with no complaints. Will continue to monitor pt.

## 2013-03-23 NOTE — Progress Notes (Addendum)
                    [address]            [place] [address]          [phone]     5 Days Post-Op Procedure(s) (LRB): MINIMALLY INVASIVE MITRAL VALVE REPAIR (MVR) (Right) INTRAOPERATIVE TRANSESOPHAGEAL ECHOCARDIOGRAM (N/A) MAZE (N/A)  Subjective: Feels well, just back from walk. No complaints.   Objective: Vital signs in last 24 hours: Patient Vitals for the past 24 hrs:  BP Temp Temp src Pulse Resp SpO2 Weight  03/23/13 0455 106/69 mmHg 99.5 F (37.5 C) Oral 70 16 94 % 169 lb 4.8 oz (76.794 kg)  03/22/13 1950 102/57 mmHg 98.9 F (37.2 C) Oral 69 18 99 % -  03/22/13 1320 91/61 mmHg 98.6 F (37 C) Oral 91 16 97 % -   Current Weight  03/23/13 169 lb 4.8 oz (76.794 kg)     Intake/Output from previous day: 05/03 0701 - 05/04 0700 In: 120 [P.O.:120] Out: -     PHYSICAL EXAM:  Heart: RRR Lungs: Clear Wound: Clean and dry, some serous drainage from anterior chest tube site Extremities: Mild LE edema    Lab Results: CBC: Recent Labs  03/21/13 0540  WBC 8.2  HGB 8.4*  HCT 24.2*  PLT 139*   BMET:  Recent Labs  03/21/13 0540  NA 134*  K 4.4  CL 98  CO2 27  GLUCOSE 101*  BUN 17  CREATININE 0.95  CALCIUM 8.8    PT/INR:  Recent Labs  03/23/13 0542  LABPROT 15.8*  INR 1.29      Assessment/Plan: S/P Procedure(s) (LRB): MINIMALLY INVASIVE MITRAL VALVE REPAIR (MVR) (Right) INTRAOPERATIVE TRANSESOPHAGEAL ECHOCARDIOGRAM (N/A) MAZE (N/A) Stable, doing well overall. Plan home later today- instructions reviewed with patient and family.   LOS: 11 days    COLLINS,GINA H 03/23/2013  Plan d/c today Tuesday check pt  I have seen and examined Cheryl Flash and agree with the above assessment  and plan.  Delight Ovens MD Beeper [phone] Office [phone] 03/23/2013 11:29 AM

## 2013-03-23 NOTE — Progress Notes (Addendum)
Pt amb 500 ft with rolling walker. Pt with no complaints. Will continue to monitor pt closely.

## 2013-03-25 ENCOUNTER — Ambulatory Visit: Payer: Self-pay | Admitting: Neurology

## 2013-03-25 ENCOUNTER — Ambulatory Visit: Payer: Self-pay | Admitting: Rehabilitative and Restorative Service Providers"

## 2013-03-25 ENCOUNTER — Other Ambulatory Visit: Payer: Self-pay | Admitting: *Deleted

## 2013-03-25 ENCOUNTER — Ambulatory Visit (INDEPENDENT_AMBULATORY_CARE_PROVIDER_SITE_OTHER): Payer: BC Managed Care – PPO | Admitting: *Deleted

## 2013-03-25 VITALS — BP 115/79 | HR 78 | Resp 16 | Ht 66.0 in | Wt 167.0 lb

## 2013-03-25 DIAGNOSIS — Z9889 Other specified postprocedural states: Secondary | ICD-10-CM

## 2013-03-25 DIAGNOSIS — I340 Nonrheumatic mitral (valve) insufficiency: Secondary | ICD-10-CM

## 2013-03-25 DIAGNOSIS — I2510 Atherosclerotic heart disease of native coronary artery without angina pectoris: Secondary | ICD-10-CM

## 2013-03-25 DIAGNOSIS — M84376A Stress fracture, unspecified foot, initial encounter for fracture: Secondary | ICD-10-CM

## 2013-03-25 DIAGNOSIS — S5290XA Unspecified fracture of unspecified forearm, initial encounter for closed fracture: Secondary | ICD-10-CM

## 2013-03-25 NOTE — Progress Notes (Signed)
SPORTS REHABILITATION PT LE STATUS      Subjective    [name] is a 57 y.o. female who is present today for continued treatment of her B UE/LE weakness and balance deficits.  Mechanism of injury/history of symptoms: No specific cause  Reports being ill over the last 3 weeks.  Overall increased confidence with amb.  Continues to report intermittent L wrist pain with pushing off to transfer sit to stand.    Attendance:  Good    Compliance:  Good   Relevant symptoms: Pain , Decreased ROM, Decreased strength, Instability, balance deficits    Symptom frequency: Persistent, wrist pain L>R with pulling off  Symptom intensity (0 - 10 scale): Now 0 Best 0 Worst 5  Complains of L > R wrist tightness/soreness with ADL's      Patient's goals for therapy: Reduce pain, Increase ROM, Increase strength, Independent with home program, improve balance     Objective:    Observation: WNL   Palpation:  WNL     ROM/Strength:    bilateral knee, bilateral ankle          LE LEFT   RIGHT   Strength    PROM AROM PROM AROM Left Right   Knee ext     4/5 4/5   Knee flex     4/5 4/5   Ankle DF     4/5 4/5   Ankle PF     4/5 4/5            Wrist flex     4/5 4/5   Wrist ext     4/5 4/5   Wrist sup/pro     4/5 4/5     Continued inability to maintain SLS for greater than 5 sec on L or R LE's      Special Tests:   Hip: N/A     Knee: N/A     Ankle: N/A     Functional:  Perform self-care activities/basic ADLs - able to perform.  Walk more than 10 minutes - able to perform.  Ascend stairs with reciprocal gait - unable to perform.  Descend stairs with reciprocal gait - unable to perform.    Assessment  Findings consistent with 57 y.o., female with   Encounter Diagnoses   Name Primary?   . Stress fracture of the metatarsals Yes   . Closed fracture of unspecified part of radius with ulna      with ROM limitations, strength limitations, functional limitations, balance deficits    Prognosis:  Fair    Contraindications/Precautions/Limitation:  Per  diagnosis  Long Term Goals:  Pain/Sx 0 - minimal, ROM/ flexibility WNL , Restoration of functional strength, Functional return to ADLs / activites without limitation        Treatment Plan:   Patient/family involved in developing goals and treatment plan: yes  Freq 1 times per week(s) for 2 month(s)     Exercise: AROM, AAROM, PROM, Progressive Resistive   Manual Techniques:  N/A   Modalities:  NA    Functional: Proprioception/Dynamic stability, Functional rehab       Thank you for referring this patient to [facility].    Loney Hering, PT      Manual stretching wrist flex, ext, sup, pronation  5 min        Gait training  5 min    Agility ladder side steps     Agility ladder  5 min   Hurdle step overs  Marching     Balance training with perturbations  5 min        Rockerboard lateral weight shifts with airex     Leg Press  5p 3x10   HS curl    LAQ 1.5p 3x10        Rice bucket finger flex/ext     Rice bucket wrist sup/pro     Flexbar wrist flex/ext     Flexbar wrist pro/sup     Dumbbell wrist pro/sup  30x 5lbs        T-band    - B row    - ER        QS     SLR     Standing calf stretch with wedge     Calf raises

## 2013-03-25 NOTE — Progress Notes (Signed)
Mrs. Ariel Simpson comes to the office with complaints of drainage from her right groin incision x 1 day.  The groin has been swollen but now is less swollen.  There is no redness to the incision which has been closed subcutaneously with Dermabond placed externally.  I had Dr. Tyrone Sage examine and talk with her and her husband.  He reassured her. They are to contact the office if the drainage becomes other than clear, she becomes febrile or the site becomes reddened.  They understand.  I redressed the area with a 4x4 dressing.

## 2013-03-26 ENCOUNTER — Other Ambulatory Visit: Payer: Self-pay | Admitting: *Deleted

## 2013-03-26 DIAGNOSIS — I059 Rheumatic mitral valve disease, unspecified: Secondary | ICD-10-CM

## 2013-03-31 ENCOUNTER — Other Ambulatory Visit: Payer: Self-pay | Admitting: Primary Care

## 2013-03-31 ENCOUNTER — Ambulatory Visit (INDEPENDENT_AMBULATORY_CARE_PROVIDER_SITE_OTHER): Payer: Self-pay | Admitting: Physician Assistant

## 2013-03-31 ENCOUNTER — Ambulatory Visit
Admission: RE | Admit: 2013-03-31 | Discharge: 2013-03-31 | Disposition: A | Discharge status: Home / Self Care | Payer: BC Managed Care – PPO | Source: Ambulatory Visit | Attending: Thoracic Surgery (Cardiothoracic Vascular Surgery) | Admitting: Thoracic Surgery (Cardiothoracic Vascular Surgery)

## 2013-03-31 ENCOUNTER — Encounter: Payer: Self-pay | Admitting: Physician Assistant

## 2013-03-31 VITALS — BP 118/76 | HR 87 | Resp 16 | Ht 66.0 in | Wt 166.5 lb

## 2013-03-31 DIAGNOSIS — Z9889 Other specified postprocedural states: Secondary | ICD-10-CM

## 2013-03-31 DIAGNOSIS — I340 Nonrheumatic mitral (valve) insufficiency: Secondary | ICD-10-CM

## 2013-03-31 DIAGNOSIS — I4891 Unspecified atrial fibrillation: Secondary | ICD-10-CM

## 2013-03-31 DIAGNOSIS — I059 Rheumatic mitral valve disease, unspecified: Secondary | ICD-10-CM

## 2013-03-31 DIAGNOSIS — Z8679 Personal history of other diseases of the circulatory system: Secondary | ICD-10-CM

## 2013-03-31 NOTE — Progress Notes (Signed)
[address]            [place] [address]          [phone]    CARDIAC SURGERY POSTOPERATIVE VISIT  Patient Name: Ariel Simpson MRN: [id number] DOB: 08/18/1956  Subjective: Ariel Simpson is a 57 y.o. female here for routine follow up s/p mini mitral valve repair, Maze, and oversew of left atrial appendage by Dr. Cornelius Moras on 03/18/2013. She has complaints of some soreness right breast wound area. Her only other complaint is swelling and some redness to right groin. She came to the office last week and was seen by Dr. Tyrone Sage. She was told she may be developing a seroma. The redness has resolved and there is only minor swelling of the right groin now. She is sleeping well and is able to lie almost completely flat.She denies chest pain or shortness of breath.   Past Medical History  Diagnosis Date  . Mitral valve prolapse   . High cholesterol   . Heart murmur   . Severe mitral regurgitation 03/03/2013    Acute on chronic with acute heart failure   . Mitral regurgitation   . Paroxysmal supraventricular tachycardia   . Paroxysmal atrial fibrillation 03/11/2013  . Shortness of breath   . PONV (postoperative nausea and vomiting)     Dizziness and Nausea  . CHF (congestive heart failure)   . Hypertension   . S/P mitral valve repair 03/18/2013    Complex valvuloplasty including triangular resection of posterior leaflet, artificial Gore-tex neocord placement x4 and 30mm Sorin Memo 3D ring annuloplasty via right mini thoracotomy  . S/P Maze operation for atrial fibrillation 03/18/2013    Complete bilateral atrial lesion set using cryothermy and biopolar radiofrequency ablation with oversewing of LA appendage via right mini thoracotomy   Prior to Admission medications   Medication Sig Start Date End Date Taking? Authorizing Provider  amiodarone (PACERONE) 200 MG tablet Take 1 tablet (200 mg total) by mouth daily. 03/23/13  Yes Wilmon Pali, PA-C  aspirin EC  81 MG EC tablet Take 1 tablet (81 mg total) by mouth daily. 03/06/13  Yes Lyn Records III, MD  atorvastatin (LIPITOR) 20 MG tablet Take 20 mg by mouth daily.   Yes Historical Provider, MD  warfarin (COUMADIN) 2.5 MG tablet Take 2.5 mg by mouth daily at 6 PM. OR AS DIRECTED 03/23/13  Yes Wilmon Pali, PA-C    Physical Exam:  Filed Vitals:   03/31/13 1258  BP: 118/76  Pulse: 87  Resp: 16    GENERAL: Well-nourished, well-developed, in no acute distress CARDIOVASCULAR: Regular rate and rhythm. No murmurs/rubs/gallops. No peripheral edema. RESPIRATORY: Respiratory effort is normal. Lungs clear to auscultation. ABDOMEN: Bowel sounds present. No masses or tenderness. WOUNDS: Clean and dry. 2 silk sutures removed by nurse (at former chest tube sites). There was some serous oozing from these, but no redness.  Imaging Studies: CXR: Comparison: 03/22/2013 and earlier.  Findings: Epicardial wires removed. Sequelae of cardiac valve  replacement. Larger lung volumes. Stable cardiac size and  mediastinal contours. Visualized tracheal air column is within  normal limits. No pneumothorax or pulmonary edema. Small right  greater than left residual pleural effusions, appear decreased.  Mild bibasilar atelectasis. No acute osseous abnormality  identified.  IMPRESSION:  Decreased but not resolved bilateral small pleural effusions.  Bibasilar atelectasis.   Impression/Plan: Overall, she continues to  make steady progress. Rhythm strip showed her to be in sinus rhythm.She had a PT and INR drawn earlier today. Her INR was 2. She has an appointment to see Dr. Katrinka Blazing in about one week. There were no changes made to her medications. She was instructed she may continue to gradually increase her activity. She was instructed to not push or pull with her arms. She was also told to minimize flexion activities for her right groin/hip area. She is only taking acetaminophen for pain. She was instructed she may begin  driving short distances (i.e. 30 minutes or less during the day). She stated she will wait a couple of weeks because she has a "stick shift". She was told to apply dry gauze and tape to 2 chest tube sites. This dressing is to be changed daily. The serous drainage should begin to decrease over the next few days. Once it does, she was told she may leave the area open to air. Dr. Cornelius Moras saw and evaluated her as well.She will return to see him in 4 weeks.   Doree Fudge, PA-C 03/31/2013 1:10 PM

## 2013-04-03 ENCOUNTER — Other Ambulatory Visit: Payer: Self-pay | Admitting: Primary Care

## 2013-04-03 NOTE — Telephone Encounter (Signed)
Refill requested. Pharmacy verified. Patient last seen on 03/11/2013

## 2013-04-04 LAB — FUNGUS CULTURE: Fungal Culture: 0

## 2013-04-08 ENCOUNTER — Ambulatory Visit: Payer: Self-pay | Admitting: Rehabilitative and Restorative Service Providers"

## 2013-04-10 ENCOUNTER — Encounter (HOSPITAL_COMMUNITY)
Admission: RE | Admit: 2013-04-10 | Discharge: 2013-04-10 | Disposition: A | Discharge status: Home / Self Care | Payer: BC Managed Care – PPO | Source: Ambulatory Visit | Attending: Cardiovascular Disease | Admitting: Cardiovascular Disease

## 2013-04-10 DIAGNOSIS — I509 Heart failure, unspecified: Secondary | ICD-10-CM | POA: Insufficient documentation

## 2013-04-10 DIAGNOSIS — I4891 Unspecified atrial fibrillation: Secondary | ICD-10-CM | POA: Insufficient documentation

## 2013-04-10 DIAGNOSIS — I059 Rheumatic mitral valve disease, unspecified: Secondary | ICD-10-CM | POA: Insufficient documentation

## 2013-04-10 DIAGNOSIS — Z5189 Encounter for other specified aftercare: Secondary | ICD-10-CM | POA: Insufficient documentation

## 2013-04-10 DIAGNOSIS — I5031 Acute diastolic (congestive) heart failure: Secondary | ICD-10-CM | POA: Insufficient documentation

## 2013-04-10 NOTE — Progress Notes (Signed)
Cardiac Rehab Medication Review by a Pharmacist  Does the patient  feel that his/her medications are working for him/her?  Yes but states "we are still playing around with the medications"   Has the patient been experiencing any side effects to the medications prescribed?  no  Does the patient measure his/her own blood pressure or blood glucose at home?  yes   Does the patient have any problems obtaining medications due to transportation or finances?   no  Understanding of regimen: good Understanding of indications: good Potential of compliance: good    Franchot Erichsen 04/10/2013 8:24 AM

## 2013-04-16 ENCOUNTER — Encounter (HOSPITAL_COMMUNITY)
Admission: RE | Admit: 2013-04-16 | Discharge: 2013-04-16 | Disposition: A | Discharge status: Home / Self Care | Payer: BC Managed Care – PPO | Source: Ambulatory Visit | Attending: Interventional Cardiology | Admitting: Interventional Cardiology

## 2013-04-16 NOTE — Progress Notes (Signed)
Ariel Simpson started her first day of exercise today at cardiac rehab. Entry blood pressure 128/72.  Telemetry rhythm Sinus rate 90. Ariel Simpson stretched and warmed up without difficulty.  After Ariel Simpson walked a few laps on the track heart was noted at 53 Sinus brady. Ariel Simpson then developed  SVT rate of 150's.  Blood pressure 124/72.  Exercise stopped after Ariel Simpson sat down Ariel Simpson converted back to Sinus Rhythm with a heart rate in the 90's. Ariel Simpson said she felt light headed and a fluttering in her chest when she experienced the tachy rhythm.  Ariel Michaelle Copas office called and notified.  ECG tracing's faxed to Ariel Michaelle Copas office for review.  Ariel Anne Fu reviewed ECG tracings and said Ariel Simpson was okay to go home. Patient instructed over the phone by Ariel Simpson to take 25 mg of Toprol today.  Ariel Simpson has an appointment to see Ariel Simpson tomorrow at 0900. We will await instructions on when Ariel Simpson can return to exercise. Exit blood pressure 112/60.  Heart rate 86.

## 2013-04-18 ENCOUNTER — Encounter (HOSPITAL_COMMUNITY)
Admission: RE | Admit: 2013-04-18 | Discharge: 2013-04-18 | Disposition: A | Discharge status: Home / Self Care | Payer: BC Managed Care – PPO | Source: Ambulatory Visit | Attending: Interventional Cardiology | Admitting: Interventional Cardiology

## 2013-04-18 NOTE — Progress Notes (Signed)
Ariel Simpson tolerated his first day of exercise without difficulty.  Telemetry rhythm Sinus with PAC's.  Vital signs stable. Will continue to monitor the patient throughout  the program.

## 2013-04-21 ENCOUNTER — Encounter (HOSPITAL_COMMUNITY)
Admission: RE | Admit: 2013-04-21 | Discharge: 2013-04-21 | Disposition: A | Discharge status: Home / Self Care | Payer: BC Managed Care – PPO | Source: Ambulatory Visit | Attending: Interventional Cardiology | Admitting: Interventional Cardiology

## 2013-04-21 DIAGNOSIS — I5031 Acute diastolic (congestive) heart failure: Secondary | ICD-10-CM | POA: Insufficient documentation

## 2013-04-21 DIAGNOSIS — I4891 Unspecified atrial fibrillation: Secondary | ICD-10-CM | POA: Insufficient documentation

## 2013-04-21 DIAGNOSIS — I509 Heart failure, unspecified: Secondary | ICD-10-CM | POA: Insufficient documentation

## 2013-04-21 DIAGNOSIS — I059 Rheumatic mitral valve disease, unspecified: Secondary | ICD-10-CM | POA: Insufficient documentation

## 2013-04-21 DIAGNOSIS — Z5189 Encounter for other specified aftercare: Secondary | ICD-10-CM | POA: Insufficient documentation

## 2013-04-21 NOTE — Progress Notes (Signed)
Reviewed Ariel Simpson's quality of life questionnaire.  Ariel Simpson denies feeling depressed. Will send Ariel Simpson's quality of life questionnaire for Dr Katrinka Blazing to review.  Ariel Simpson is tolerating exercise without difficulty. Will continue to monitor the patient throughout  the program.

## 2013-04-23 ENCOUNTER — Ambulatory Visit: Payer: Self-pay | Admitting: Rehabilitative and Restorative Service Providers"

## 2013-04-23 ENCOUNTER — Encounter (HOSPITAL_COMMUNITY)
Admission: RE | Admit: 2013-04-23 | Discharge: 2013-04-23 | Disposition: A | Discharge status: Home / Self Care | Payer: BC Managed Care – PPO | Source: Ambulatory Visit | Attending: Interventional Cardiology | Admitting: Interventional Cardiology

## 2013-04-23 DIAGNOSIS — S5290XA Unspecified fracture of unspecified forearm, initial encounter for closed fracture: Secondary | ICD-10-CM

## 2013-04-23 DIAGNOSIS — M84376A Stress fracture, unspecified foot, initial encounter for fracture: Secondary | ICD-10-CM

## 2013-04-23 NOTE — Progress Notes (Signed)
Orthopedic Sports/Spine  PT Note    [name]   [id number]     Diagnosis: B LE/UE weakness, B wrist fractures    Subjective:  Pain Score:  0  Pain: Unchanged, no complaints of pain today  Asked for referral for a new orthotist, discussed using O&P in [facility]    Objective:  ROM - Unchanged, Bilateral, Wrist, AROM  Strength - Ther Ex per flowsheet  Function: - Improved  Education:  Verbal cues for ther ex    Objective     see flowsheet    Treatment:  Ther Exercise per flowsheet    Assessment:  Patient responding favorably    Plan of Care:  Continue per Plan of care -  As written    Thank you for referring this patient to [facility]    Loney Hering, PT        Manual stretching wrist flex, ext, sup, pronation  5 min        Gait training  5 min    Agility ladder side steps     Agility ladder  5 min   Hurdle step overs     Marching     Balance training with perturbations  5 min        Rockerboard lateral weight shifts with airex     Leg Press  6p 3x10   HS curl    LAQ 1.5p 3x10        Rice bucket finger flex/ext     Rice bucket wrist sup/pro     Flexbar wrist flex/ext     Flexbar wrist pro/sup     Dumbbell wrist pro/sup  30x 5lbs        T-band    - B row    - ER        QS     SLR     Standing calf stretch with wedge     Calf raises

## 2013-04-25 ENCOUNTER — Encounter (HOSPITAL_COMMUNITY)
Admission: RE | Admit: 2013-04-25 | Discharge: 2013-04-25 | Disposition: A | Discharge status: Home / Self Care | Payer: BC Managed Care – PPO | Source: Ambulatory Visit | Attending: Interventional Cardiology | Admitting: Interventional Cardiology

## 2013-04-28 ENCOUNTER — Encounter: Payer: Self-pay | Admitting: Thoracic Surgery (Cardiothoracic Vascular Surgery)

## 2013-04-28 ENCOUNTER — Encounter (HOSPITAL_COMMUNITY)
Admission: RE | Admit: 2013-04-28 | Discharge: 2013-04-28 | Disposition: A | Discharge status: Home / Self Care | Payer: BC Managed Care – PPO | Source: Ambulatory Visit | Attending: Interventional Cardiology | Admitting: Interventional Cardiology

## 2013-04-28 ENCOUNTER — Ambulatory Visit (INDEPENDENT_AMBULATORY_CARE_PROVIDER_SITE_OTHER): Payer: Self-pay | Admitting: Thoracic Surgery (Cardiothoracic Vascular Surgery)

## 2013-04-28 VITALS — BP 133/93 | HR 79 | Resp 20 | Ht 66.0 in | Wt 163.0 lb

## 2013-04-28 DIAGNOSIS — Z9889 Other specified postprocedural states: Secondary | ICD-10-CM

## 2013-04-28 DIAGNOSIS — Z8679 Personal history of other diseases of the circulatory system: Secondary | ICD-10-CM

## 2013-04-28 NOTE — Progress Notes (Signed)
[address]       [name] [address]             [phone]     CARDIOTHORACIC SURGERY OFFICE NOTE  Referring Provider is [name], MD PCP is Leanor Rubenstein, MD   HPI:  Patient returns for routine followup status post minimally invasive mitral valve repair and Maze procedure 03/18/2013. Her postoperative recovery has been uncomplicated. She was last seen here in our office 03/31/2013.  Since then she has continued to progress well. Several weeks ago she did have some symptomatic bradycardia for which she was taken off of amiodarone.  After that she has been feeling quite well. She has mild residual soreness in her chest. She is no longer taking any sort of pain relievers, and the soreness typically only bothers her when she reaches with her arms and shoulders or turns or twists with her upper body. She has been actively participating in the cardiac rehabilitation program and her exercise tolerance is improving. This past weekend she went hiking at [employer] with her husband. Her breathing is dramatically better than it was prior to surgery. She is sleeping well at night. Overall she has no complaints.   Current Outpatient Prescriptions  Medication Sig Dispense Refill  . aspirin EC 81 MG EC tablet Take 1 tablet (81 mg total) by mouth daily.      Marland Kitchen atorvastatin (LIPITOR) 20 MG tablet Take 20 mg by mouth daily.      . metoprolol succinate (TOPROL-XL) 50 MG 24 hr tablet Take 25 mg by mouth daily. Take with or immediately following a meal.      . warfarin (COUMADIN) 2.5 MG tablet Take 2.5 mg by mouth as directed. Take 3.75mg  (1.5 tablets) on all days except 2.5mg  (1 tablet) on Wednesday and Saturday       No current facility-administered medications for this visit.      Physical Exam:   BP 133/93  Pulse 79  Resp 20  Ht 5\' 6"  (1.676 m)  Wt 163 lb (73.936 kg)  BMI 26.32 kg/m2  SpO2 98%  General:  Well-appearing  Chest:   Clear to auscultation with  symmetrical breath sounds  CV:   Regular rate and rhythm without murmur  Incisions:  Clean dry and healing nicely, there is small firm mass in the right groin consistent with possible small lymphocele  Abdomen:  Soft nontender  Extremities:  Warm and well-perfused with no lower extremity edema  Diagnostic Tests:  2 channel telemetry rhythm strip demonstrates normal sinus rhythm   Impression:  The patient is doing well just over one month following minimally invasive mitral valve repair and Maze procedure. She is maintaining sinus rhythm and progressing nicely. All of her surgical incisions are healing well. She does have a small mass in the right groin consistent with a small lymphocele. This is not bothering her at all.  Plan:  I've encouraged patient to continue to gradually increase her physical activity as tolerated but to refrain from any sort of heavy lifting or strenuous use of her arms or shoulders which seem to exacerbate the pain or soreness in her chest. I've instructed her to stop taking aspirin while she is therapeutic on Coumadin. All of her questions been addressed. We'll plan to see her back in 8 weeks for routine followup and rhythm check. At some point it would be reasonable to check a followup echocardiogram to reassess left ventricular function following mitral  valve repair.   Salvatore Decent. Cornelius Moras, MD 04/28/2013 1:37 PM

## 2013-04-28 NOTE — Patient Instructions (Signed)
Stop taking aspirin while you are taking coumadin  The patient may continue to gradually increase their physical activity as tolerated.  They should refrain from any heavy lifting or strenuous use of their arms and shoulders until at least 8 weeks from the time of their surgery, and they should avoid activities that cause increased pain in their chest on the side of their surgical incision.  Otherwise they may continue to increase their activities without any particular limitations.

## 2013-04-29 ENCOUNTER — Ambulatory Visit: Payer: Self-pay

## 2013-04-29 ENCOUNTER — Ambulatory Visit: Payer: Self-pay | Admitting: Rehabilitative and Restorative Service Providers"

## 2013-04-29 ENCOUNTER — Other Ambulatory Visit: Payer: Self-pay | Admitting: Physician Assistant

## 2013-04-30 ENCOUNTER — Encounter (HOSPITAL_COMMUNITY): Payer: BC Managed Care – PPO

## 2013-05-02 ENCOUNTER — Ambulatory Visit: Payer: Self-pay | Admitting: Primary Care

## 2013-05-02 ENCOUNTER — Encounter (HOSPITAL_COMMUNITY): Payer: BC Managed Care – PPO

## 2013-05-05 ENCOUNTER — Encounter (HOSPITAL_COMMUNITY): Payer: BC Managed Care – PPO

## 2013-05-06 ENCOUNTER — Ambulatory Visit: Payer: Self-pay | Admitting: Rehabilitative and Restorative Service Providers"

## 2013-05-06 DIAGNOSIS — S5290XA Unspecified fracture of unspecified forearm, initial encounter for closed fracture: Secondary | ICD-10-CM

## 2013-05-06 DIAGNOSIS — M84376A Stress fracture, unspecified foot, initial encounter for fracture: Secondary | ICD-10-CM

## 2013-05-06 NOTE — Progress Notes (Signed)
Orthopedic Sports/Spine  PT Note    [name]   [id number]     Diagnosis: B LE/UE weakness, B wrist fractures    Subjective:  Pain Score:  0  Pain: Unchanged, no complaints of pain today  Will be evaluated for new orthotics tomorrow    Objective:  ROM - Unchanged, Bilateral, Wrist, AROM  Strength - Ther Ex per flowsheet  Function: - Improved, balance on this date with pertubations  Education:  Verbal cues for ther ex    Objective     see flowsheet    Treatment:  Ther Exercise per flowsheet    Assessment:  Patient responding favorably    Plan of Care:  Continue per Plan of care -  As written    Thank you for referring this patient to [facility]    Loney Hering, PT        Manual stretching wrist flex, ext, sup, pronation  5 min        Gait training  5 min    Agility ladder side steps     Agility ladder  5 min   Hurdle step overs     Marching     Balance training with perturbations  5 min        Rockerboard lateral weight shifts with airex     Leg Press  5p 3x10   HS curl    LAQ 1.5p 3x10        Rice bucket finger flex/ext     Rice bucket wrist sup/pro     Flexbar wrist flex/ext     Flexbar wrist pro/sup     Dumbbell wrist pro/sup  30x 5lbs        T-band    - B row    - ER        QS     SLR     Standing calf stretch with wedge     Calf raises

## 2013-05-07 ENCOUNTER — Ambulatory Visit
Admit: 2013-05-07 | Discharge: 2013-05-07 | Disposition: A | Discharge status: Home / Self Care | Payer: Self-pay | Source: Ambulatory Visit | Attending: Obstetrics | Admitting: Obstetrics

## 2013-05-07 ENCOUNTER — Ambulatory Visit: Payer: Self-pay

## 2013-05-07 ENCOUNTER — Encounter (HOSPITAL_COMMUNITY): Payer: BC Managed Care – PPO

## 2013-05-07 DIAGNOSIS — M25373 Other instability, unspecified ankle: Secondary | ICD-10-CM

## 2013-05-09 ENCOUNTER — Ambulatory Visit: Payer: Self-pay | Admitting: Primary Care

## 2013-05-09 ENCOUNTER — Encounter (HOSPITAL_COMMUNITY): Payer: BC Managed Care – PPO

## 2013-05-12 ENCOUNTER — Encounter (HOSPITAL_COMMUNITY)
Admission: RE | Admit: 2013-05-12 | Discharge: 2013-05-12 | Disposition: A | Discharge status: Home / Self Care | Payer: BC Managed Care – PPO | Source: Ambulatory Visit | Attending: Interventional Cardiology | Admitting: Interventional Cardiology

## 2013-05-12 NOTE — Progress Notes (Signed)
Home exercise reviewed by Lindaann Slough exercise specialist.

## 2013-05-12 NOTE — Progress Notes (Signed)
Ariel Simpson 57 y.o. female Nutrition Note Spoke with pt.  Nutrition Plan and Nutrition Survey goals reviewed with pt. Pt is following Step 1 of the Therapeutic Lifestyle Changes diet. Pt feels like she is doing well with the heart healthy diet. Areas for improvement in diet to become heart healthier discussed (e.g. Switching from 2% milk, choosing low-fat cheese, using less Olive oil, and limiting added sugars). Pt wants to lose wt. Pt has been trying to lose wt by decreasing portion sizes. Pt states her areas for improvement include portion size and frequent snacking. Wt loss tips reviewed. Pt is on Coumadin and is aware of the need to follow a consistent vitamin K diet. Pt expressed understanding of the information reviewed. Pt aware of nutrition education classes offered and is unable to attend nutrition classes due to "starting back to work next week."  Nutrition Diagnosis   Food-and nutrition-related knowledge deficit related to lack of exposure to information as related to diagnosis of: ? CVD    Overweight related to excessive energy intake as evidenced by a BMI of 26.6  Nutrition RX/ Estimated Daily Nutrition Needs for: wt loss  1200-1700 Kcal, 30-45 gm fat, 7-13 gm sat fat, 1.1-1.7 gm trans-fat, <1500 mg sodium   Nutrition Intervention   Pt's individual nutrition plan reviewed with pt.   Benefits of adopting Therapeutic Lifestyle Changes discussed when Medficts reviewed.   Pt to attend the Portion Distortion class   Pt given handouts for: ? Nutrition I class ? Nutrition II class    Continue client-centered nutrition education by RD, as part of interdisciplinary care. Goal(s)   Pt to identify and limit food sources of saturated fat, trans fat, and cholesterol   Pt to identify food quantities necessary to achieve: ? wt loss to a goal wt of 146-164 lb (66.5-74.8 kg) at graduation from cardiac rehab.  Monitor and Evaluate progress toward nutrition goal with team. Nutrition Risk: Change to  Low   Mickle Plumb, M.Ed, RD, LDN, CDE 05/12/2013 10:59 AM

## 2013-05-13 ENCOUNTER — Ambulatory Visit: Payer: Self-pay | Admitting: Rehabilitative and Restorative Service Providers"

## 2013-05-13 DIAGNOSIS — M84376A Stress fracture, unspecified foot, initial encounter for fracture: Secondary | ICD-10-CM

## 2013-05-13 DIAGNOSIS — S5290XA Unspecified fracture of unspecified forearm, initial encounter for closed fracture: Secondary | ICD-10-CM

## 2013-05-13 NOTE — Progress Notes (Signed)
Orthopedic Sports/Spine  PT Note    [name]   [id number]     Diagnosis: B LE/UE weakness, B wrist fractures    Subjective:  Pain Score:  0  Pain: Unchanged, no complaints of pain today  Will be fit for new orthotics later this week    Objective:  ROM - Unchanged, Bilateral, Wrist, AROM  Strength - Ther Ex per flowsheet  Function: - Unchanged  Education:  Verbal cues for ther ex    Objective     see flowsheet    Treatment:  Ther Exercise per flowsheet    Assessment:  Patient responding favorably    Plan of Care:  Continue per Plan of care -  As written    Thank you for referring this patient to [facility]    Loney Hering, PT        Manual stretching wrist flex, ext, sup, pronation  5 min        Gait training  5 min    Agility ladder side steps     Agility ladder  5 min   Hurdle step overs     Marching     Balance training with perturbations  5 min        Rockerboard lateral weight shifts with airex     Leg Press  5.5p 3x10   HS curl    LAQ 1.5p 3x10        Rice bucket finger flex/ext     Rice bucket wrist sup/pro     Flexbar wrist flex/ext     Flexbar wrist pro/sup     Dumbbell wrist pro/sup  30x 5lbs        T-band    - B row    - ER        QS     SLR     Standing calf stretch with wedge     Calf raises

## 2013-05-14 ENCOUNTER — Encounter (HOSPITAL_COMMUNITY)
Admission: RE | Admit: 2013-05-14 | Discharge: 2013-05-14 | Disposition: A | Discharge status: Home / Self Care | Payer: BC Managed Care – PPO | Source: Ambulatory Visit | Attending: Interventional Cardiology | Admitting: Interventional Cardiology

## 2013-05-14 ENCOUNTER — Encounter (HOSPITAL_COMMUNITY): Payer: BC Managed Care – PPO

## 2013-05-15 ENCOUNTER — Ambulatory Visit
Admit: 2013-05-15 | Discharge: 2013-05-15 | Disposition: A | Discharge status: Home / Self Care | Payer: Self-pay | Source: Ambulatory Visit | Attending: Orthopedic Surgery | Admitting: Orthopedic Surgery

## 2013-05-15 ENCOUNTER — Ambulatory Visit
Admit: 2013-05-15 | Discharge: 2013-05-15 | Disposition: A | Discharge status: Home / Self Care | Payer: Self-pay | Source: Ambulatory Visit | Attending: Obstetrics | Admitting: Obstetrics

## 2013-05-15 ENCOUNTER — Ambulatory Visit: Payer: Self-pay

## 2013-05-15 DIAGNOSIS — M25373 Other instability, unspecified ankle: Secondary | ICD-10-CM

## 2013-05-15 DIAGNOSIS — M2140 Flat foot [pes planus] (acquired), unspecified foot: Secondary | ICD-10-CM

## 2013-05-15 LAB — BASIC METABOLIC PANEL
Anion Gap: 10 (ref 7–16)
CO2: 29 mmol/L — ABNORMAL HIGH (ref 20–28)
Calcium: 9.1 mg/dL (ref 8.6–10.2)
Chloride: 100 mmol/L (ref 96–108)
Creatinine: 0.73 mg/dL (ref 0.51–0.95)
GFR,Black: 104 *
GFR,Caucasian: 90 *
Glucose: 90 mg/dL (ref 60–99)
Lab: 17 mg/dL (ref 6–20)
Potassium: 4.2 mmol/L (ref 3.3–5.1)
Sodium: 139 mmol/L (ref 133–145)

## 2013-05-15 LAB — GYN CYTOLOGY

## 2013-05-15 LAB — HPV DNA PROBE WITH CYTOLOGY: HPV Hybrid Capture: NEGATIVE

## 2013-05-15 NOTE — Patient Instructions (Signed)
Foot Orthosis Wearing Instructions:    These instructions are intended to help ensure successful use of your Foot Orthosis (FO). Please read and follow these instructions carefully.  While being fitted with your FO, your Orthotist will instruct you on how to wear and use it effectively.  Some points to remember:    Socks:   1. Always wear clean, non-elastic cotton socks with your shoes.  Shoes  1. Appropriate shoes are vital to the success of your inserts.   Wear only shoe with Laces (preferred) or velcro closures: no loafers or slip-ons.  Make sure laces are comfortably snug to anchor your feet to the shoe so that they work together as one unit.  2. It may be necessary to get a larger shoe, anywhere from ½ to a 1½ size larger than your current shoe to accommodate the increased thickness of the insert.    3. Running shoes with a mild rocker bottom and/or wide shoes with a high toe box, and/or removable insoles are optimal.  If your shoes need to have modifications to the sole (lifts or wedges), look for soles made of a solid material (no air or gel soles).  Notify the sales clerk of what you are looking for and get their permission to return the shoes if, after our inspection, we find that we are not able to work with the sole materials.  Wear and Care  1. It is important for everyone to watch for warning signs of ill-fitting FO’s and/or shoes.  2. It is especially critical for people with diabetes, neuropathy or arthritis to vigilantly follow these fundamentals  a. Break in your new FO by wearing it 1-2 hours for you first day then gently increase wear time by and hour or two until you are up to a full day in (approximately) 1- 2 weeks.  b. Every time you remove your shoes and the FO it is vital to check your skin for redness, blisters or pressure points.    i. If you see any reddened areas and you are diabetic or have neuropathy discontinue wearing the FO and contact our office immediately.  ii. If you have  blisters contact our office immediately.  iii. If you are not diabetic, or have neuropathy, and do not have blisters, confirm that any reddened areas disappears within 30 minutes prior to reapplying the FO’s and increasing wear time.  3. The orthosis may be gently wiped clean (inside and out) with a soft cloth using water and a mild detergent.  Rinse thoroughly and towel dry.  It can be immediately placed back into you shoe and worn.  The material will not absorb any liquid.    Compliance with instructions and follow-up appointments are very important.  At your follow-up appointment your Orthotist will make any necessary ‘fine-tuning’ adjustments to optimize your comfort and function.  It is normal for adjustments to be made to optimize the fit and function of your Shoe Inserts.    If you have any question or concerns about your shoes or foot problems, please contact [facility], at [phone].  Your foot health and comfort are very important to us.    I have been provided a copy of these recommendations.

## 2013-05-16 ENCOUNTER — Encounter: Payer: Self-pay | Admitting: Primary Care

## 2013-05-16 ENCOUNTER — Ambulatory Visit: Payer: Self-pay | Admitting: Primary Care

## 2013-05-16 ENCOUNTER — Encounter (HOSPITAL_COMMUNITY): Payer: BC Managed Care – PPO

## 2013-05-16 ENCOUNTER — Encounter (HOSPITAL_COMMUNITY)
Admission: RE | Admit: 2013-05-16 | Discharge: 2013-05-16 | Disposition: A | Discharge status: Home / Self Care | Payer: BC Managed Care – PPO | Source: Ambulatory Visit | Attending: Interventional Cardiology | Admitting: Interventional Cardiology

## 2013-05-16 VITALS — BP 122/68 | HR 74 | Temp 97.0°F | Resp 16 | Ht 63.0 in | Wt 122.0 lb

## 2013-05-16 DIAGNOSIS — E039 Hypothyroidism, unspecified: Secondary | ICD-10-CM

## 2013-05-16 DIAGNOSIS — E559 Vitamin D deficiency, unspecified: Secondary | ICD-10-CM

## 2013-05-16 DIAGNOSIS — Z139 Encounter for screening, unspecified: Secondary | ICD-10-CM

## 2013-05-16 DIAGNOSIS — E785 Hyperlipidemia, unspecified: Secondary | ICD-10-CM

## 2013-05-16 LAB — LIPID PANEL
Chol/HDL Ratio: 2
Cholesterol: 246 mg/dL — AB
HDL: 122 mg/dL
LDL Calculated: 110 mg/dL
Non HDL Cholesterol: 124 mg/dL
Triglycerides: 68 mg/dL

## 2013-05-16 LAB — MULTIPLE ORDERING DOCS

## 2013-05-16 NOTE — Progress Notes (Signed)
Reason for Visit: Follow-up    Saw Dr. Arma Heading, on Prolia, labs normal.    Is interested in her cholesterol, was not run.  Will add on to labs.  She did fill the lipitor but has not started taking it yet.    Patient Active Problem List   Diagnosis Code   . Reported Trauma Neck    . Allergic Rhinitis 477.9   . Irritable bowel syndrome 564.1   . Hypertension 401.9   . Dysplastic Nevus 448.1   . Rosacea 695.3   . Osteoporosis 733.00   . Hypothyroidism 244.9   . Scoliosis 737.30   . Urge Incontinence Of Urine 788.31   . A-V Malformation Repair Supratentorial Complex    . Convulsive Disorder 780.39   . 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 Ringing In The Ears (Tinnitus) 388.30   . GERD (gastroesophageal reflux disease) 530.81     Current Outpatient Prescriptions   Medication   . azelastine (ASTELIN) 137 MCG/SPRAY nasal spray   . Calcium Carb-Cholecalciferol 600-200 MG-UNIT TABS   . carBAMazepine (TEGRETOL XR) 100 MG 12 hr tablet   . amLODIPine (NORVASC) 5 MG tablet   . darifenacin (ENABLEX) 15 MG 24 hr tablet   . levothyroxine (SYNTHROID, LEVOTHROID) 25 MCG tablet   . GuaiFENesin (MUCINEX PO)   . Denosumab (PROLIA SC)   . loratadine (CLARITIN) 10 MG tablet   . Multiple Vitamin (MULTIVITAMIN) per tablet   . metronidazole (NORITATE) 1 % cream   . cholecalciferol (VITAMIN D) 1000 UNITS capsule   . fluticasone (FLONASE) 50 MCG/ACT nasal spray   . rabeprazole (ACIPHEX) 20 MG tablet   . ipratropium (ATROVENT HFA) 17 MCG/ACT inhaler   . albuterol-ipratropium (COMBIVENT RESPIMAT) 20-100 MCG/ACT inhaler   . lamoTRIgine (LAMICTAL) 25 MG tablet   . atorvastatin (LIPITOR) 10 MG tablet   . Non-System Medication   . NON-SYSTEM MEDICATION *A*     No current facility-administered medications for this visit.       Medication list reviewed and updated, no changes were made today    Exam: BP 120/62  Pulse 74  Temp(Src) 36.1 C (97 F) (Temporal)  Resp 16  Ht 1.6 m (5\' 3" )  Wt 55.339 kg (122 lb)  BMI 21.62 kg/m2  SpO2  100%  Well nourished alert and conversant 57 yo in nad. Lungs are CTA bilaterally, resp easy.  Cardiac exam reveals nl S1S2 RRR. Abdomen is soft, nontender. Extremities are without edema    A/P:  1.  Hyperlipidemia--labs added, TG 68 HDL 122 LDL 110.  I tried to calculate her Framingham risk but with an HDL over 100, the calculator would not register.  If I enter it as 100, it gives me a 1% risk score  Will discuss this with pt.   2. HTN--meets JNC8 guideline goal, continue norvasc

## 2013-05-19 ENCOUNTER — Other Ambulatory Visit: Payer: Self-pay | Admitting: Neurology

## 2013-05-19 ENCOUNTER — Encounter (HOSPITAL_COMMUNITY)
Admission: RE | Admit: 2013-05-19 | Discharge: 2013-05-19 | Disposition: A | Discharge status: Home / Self Care | Payer: BC Managed Care – PPO | Source: Ambulatory Visit | Attending: Interventional Cardiology | Admitting: Interventional Cardiology

## 2013-05-19 ENCOUNTER — Encounter (HOSPITAL_COMMUNITY): Payer: BC Managed Care – PPO

## 2013-05-19 NOTE — Progress Notes (Signed)
Pt switched to 645 exercise class due to work schedule.  Noted elevation in bp pre exercise and during.  Pt admits to long history of elevation in bp prior to her heart surgery.  Pt has bp meter at home and readings range from the 130-140's systolic and upper 90's diastolic.  Pt recently seen in the office by Dr. Katrinka Blazing and had elevation in her bp.  Dr. Katrinka Blazing increased her toprol dose to 50mg  once a day which pt takes at night.  Pt has not noted any decrease in bp readings with the medication change.  Will send today's exercise readings for MD review.  Ariel Simpson

## 2013-05-20 MED ORDER — TEGRETOL XR 200 MG PO TB12
ORAL_TABLET | ORAL | Status: DC
Start: 2013-05-19 — End: 2013-11-05

## 2013-05-21 ENCOUNTER — Encounter (HOSPITAL_COMMUNITY): Payer: BC Managed Care – PPO

## 2013-05-21 ENCOUNTER — Encounter (HOSPITAL_COMMUNITY)
Admission: RE | Admit: 2013-05-21 | Discharge: 2013-05-21 | Disposition: A | Discharge status: Home / Self Care | Payer: BC Managed Care – PPO | Source: Ambulatory Visit | Attending: Interventional Cardiology | Admitting: Interventional Cardiology

## 2013-05-21 DIAGNOSIS — I4891 Unspecified atrial fibrillation: Secondary | ICD-10-CM | POA: Insufficient documentation

## 2013-05-21 DIAGNOSIS — I059 Rheumatic mitral valve disease, unspecified: Secondary | ICD-10-CM | POA: Insufficient documentation

## 2013-05-21 DIAGNOSIS — Z5189 Encounter for other specified aftercare: Secondary | ICD-10-CM | POA: Insufficient documentation

## 2013-05-21 DIAGNOSIS — I5031 Acute diastolic (congestive) heart failure: Secondary | ICD-10-CM | POA: Insufficient documentation

## 2013-05-21 DIAGNOSIS — I509 Heart failure, unspecified: Secondary | ICD-10-CM | POA: Insufficient documentation

## 2013-05-22 ENCOUNTER — Ambulatory Visit: Payer: Self-pay | Admitting: Rehabilitative and Restorative Service Providers"

## 2013-05-23 ENCOUNTER — Encounter (HOSPITAL_COMMUNITY): Payer: BC Managed Care – PPO

## 2013-05-26 ENCOUNTER — Encounter (HOSPITAL_COMMUNITY)
Admission: RE | Admit: 2013-05-26 | Discharge: 2013-05-26 | Disposition: A | Discharge status: Home / Self Care | Payer: BC Managed Care – PPO | Source: Ambulatory Visit | Attending: Interventional Cardiology | Admitting: Interventional Cardiology

## 2013-05-26 ENCOUNTER — Encounter (HOSPITAL_COMMUNITY): Payer: BC Managed Care – PPO

## 2013-05-27 ENCOUNTER — Ambulatory Visit: Payer: Self-pay | Admitting: Rehabilitative and Restorative Service Providers"

## 2013-05-28 ENCOUNTER — Encounter (HOSPITAL_COMMUNITY): Payer: BC Managed Care – PPO

## 2013-05-28 ENCOUNTER — Encounter (HOSPITAL_COMMUNITY)
Admission: RE | Admit: 2013-05-28 | Discharge: 2013-05-28 | Disposition: A | Discharge status: Home / Self Care | Payer: BC Managed Care – PPO | Source: Ambulatory Visit | Attending: Interventional Cardiology | Admitting: Interventional Cardiology

## 2013-05-30 ENCOUNTER — Encounter (HOSPITAL_COMMUNITY): Payer: BC Managed Care – PPO

## 2013-05-30 ENCOUNTER — Encounter (HOSPITAL_COMMUNITY)
Admission: RE | Admit: 2013-05-30 | Discharge: 2013-05-30 | Disposition: A | Discharge status: Home / Self Care | Payer: BC Managed Care – PPO | Source: Ambulatory Visit | Attending: Interventional Cardiology | Admitting: Interventional Cardiology

## 2013-06-01 ENCOUNTER — Encounter: Payer: Self-pay | Admitting: Gastroenterology

## 2013-06-02 ENCOUNTER — Ambulatory Visit: Payer: Self-pay | Admitting: Orthopedic Surgery

## 2013-06-02 ENCOUNTER — Other Ambulatory Visit: Payer: Self-pay | Admitting: Orthopedic Surgery

## 2013-06-02 ENCOUNTER — Encounter: Payer: Self-pay | Admitting: Orthopedic Surgery

## 2013-06-02 ENCOUNTER — Encounter (HOSPITAL_COMMUNITY)
Admission: RE | Admit: 2013-06-02 | Discharge: 2013-06-02 | Disposition: A | Discharge status: Home / Self Care | Payer: BC Managed Care – PPO | Source: Ambulatory Visit | Attending: Interventional Cardiology | Admitting: Interventional Cardiology

## 2013-06-02 ENCOUNTER — Encounter (HOSPITAL_COMMUNITY): Payer: BC Managed Care – PPO

## 2013-06-02 VITALS — BP 136/82 | Ht 63.0 in | Wt 120.0 lb

## 2013-06-02 DIAGNOSIS — M25579 Pain in unspecified ankle and joints of unspecified foot: Secondary | ICD-10-CM

## 2013-06-02 DIAGNOSIS — S9030XA Contusion of unspecified foot, initial encounter: Secondary | ICD-10-CM | POA: Insufficient documentation

## 2013-06-02 DIAGNOSIS — M810 Age-related osteoporosis without current pathological fracture: Secondary | ICD-10-CM

## 2013-06-02 HISTORY — DX: Contusion of unspecified foot, initial encounter: S90.30XA

## 2013-06-02 NOTE — H&P (Signed)
Orthopaedic Visit:  Consult / New Problem  [name], MRN: [id number]  Date of visit: 06/02/2013    CC: Left great toe  pain and swelling  -urgent consultation    Subjective:     Date of visit: 06/02/2013  Symptoms duration:  2 days  Injury:  Dropped bar of soap onto left great toe  Made worse by:  Nothing  Made better by:  Rest, ice, elevation  Treatments to date:  Rest, ice, elevation - xrays - told no Fx  Exercise type /ability to do:  Walks, works with Psychologist, educational  Work type and status:   Warden/ranger      Past medical History, Past surgical History, Medications, Allergies, ROS 12 point, Family history, Social history:  Encounter information and details reviewed, reviewed with patient.      Objective:    Alert and oriented x 3.  Pleasant, cooperative, appropriate.  Right and Left lower extremities: Palpable pulses, neurovascularly intact.    Calf soft and nontender.  Swelling and ecchymosis over left great toe and medial forefoot.  Tenderness:  1st MTP and medial dorsal forefoot  ROM:  Full painless ROM of 1st MTP  Strength:  5/5 EHL, FHL     Imaging studies:   Left great toe 3 view: No fracture or dislocation. - from outside reviewed.  Osteopenia      Impression:  57 y.o. female, with left great toe contusion from a fallen bar of soap.   -Osteoporosis  -No evidence of significant Fx, soft tissue injury  -likely about 1 month recovery time    Detailed discussion and patient's questions answered.    Plan:    1. Immobilization:None  2. Weight bearing status:WB  3. DVT prophylaxis:NA  4. Anti-inflammatory / Pain medication:  Tylenol as needed  5. Orthotics: Continue current shoe inserts  6. Therapy:  Ice 20 minutes 2-3 times daily  7. Exercise:  As tolerated  8. Bone Health: Vitamin D3            Calcium  9. Work Status: Patient working - Yes  10. Follow - up:      For the next visit:  Follow-Up Needs / I am Ordering:  1. X-rays next visit:  2. Room:  3. Cast:    I saw and evaluated the patient.  Encounter  information reviewed including past medical history, past surgical history, medications, allergies, review of systems, social history, and family history.  Images and laboratory results reviewed.  I agree with the resident's/fellow's findings and plan of care as documented above.  Note edited and finalized.  Discussed with patient in detail and questions answered.    Jenna Luo, MD  Professor of Orthopaedics  [address] of [facility]        Jenna Luo, MD  Professor of Orthopaedics  [employer] [facility] [employer] [facility] [employer]

## 2013-06-03 ENCOUNTER — Ambulatory Visit: Payer: Self-pay | Admitting: Rehabilitative and Restorative Service Providers"

## 2013-06-03 ENCOUNTER — Ambulatory Visit: Payer: Self-pay

## 2013-06-04 ENCOUNTER — Encounter (HOSPITAL_COMMUNITY)
Admission: RE | Admit: 2013-06-04 | Discharge: 2013-06-04 | Disposition: A | Discharge status: Home / Self Care | Payer: BC Managed Care – PPO | Source: Ambulatory Visit | Attending: Interventional Cardiology | Admitting: Interventional Cardiology

## 2013-06-04 ENCOUNTER — Encounter (HOSPITAL_COMMUNITY): Payer: BC Managed Care – PPO

## 2013-06-04 NOTE — Progress Notes (Signed)
Name: DAMEKA EVANGELISTA  MRN: [id number]  Date: 05/07/13    PURPOSE    Patient presents to today's appointment for evaluation of foot orthoses.    SUBJECTIVE    Patient is a 57 y.o. female with a diagnosis of ankle instability, bilaterally and balance and proprioceptive issues..  Previous orthotic management includes: polyproylene UCBL's, full footplates made by Assunta Gambles..    OBJECTIVE    Static Assessment: pes planus   Knee Frontal Plane Alignment: pes planus     Gait Observation reveals: shuffle type gait, suspect its due to full-length plastic UCBL's    Patient was casted? yes    Bilateral foam impressions? yes     ASSESSMENT     Patient is to benefit from foot orthotics to: Correct/Re-alignment, Pressure Relief and Shock Absorption    PLAN    Return in 1 week for fit/delivery of modified UCBL's

## 2013-06-06 ENCOUNTER — Ambulatory Visit: Payer: Self-pay

## 2013-06-06 ENCOUNTER — Encounter (HOSPITAL_COMMUNITY)
Admission: RE | Admit: 2013-06-06 | Discharge: 2013-06-06 | Disposition: A | Discharge status: Home / Self Care | Payer: BC Managed Care – PPO | Source: Ambulatory Visit | Attending: Interventional Cardiology | Admitting: Interventional Cardiology

## 2013-06-06 ENCOUNTER — Encounter (HOSPITAL_COMMUNITY): Payer: BC Managed Care – PPO

## 2013-06-09 ENCOUNTER — Encounter (HOSPITAL_COMMUNITY): Payer: BC Managed Care – PPO

## 2013-06-09 ENCOUNTER — Encounter (HOSPITAL_COMMUNITY)
Admission: RE | Admit: 2013-06-09 | Discharge: 2013-06-09 | Disposition: A | Discharge status: Home / Self Care | Payer: BC Managed Care – PPO | Source: Ambulatory Visit | Attending: Interventional Cardiology | Admitting: Interventional Cardiology

## 2013-06-10 ENCOUNTER — Ambulatory Visit: Payer: Self-pay | Admitting: Rehabilitative and Restorative Service Providers"

## 2013-06-11 ENCOUNTER — Encounter (HOSPITAL_COMMUNITY)
Admission: RE | Admit: 2013-06-11 | Discharge: 2013-06-11 | Disposition: A | Discharge status: Home / Self Care | Payer: BC Managed Care – PPO | Source: Ambulatory Visit | Attending: Interventional Cardiology | Admitting: Interventional Cardiology

## 2013-06-11 ENCOUNTER — Encounter (HOSPITAL_COMMUNITY): Payer: BC Managed Care – PPO

## 2013-06-12 ENCOUNTER — Encounter: Payer: Self-pay | Admitting: Primary Care

## 2013-06-12 ENCOUNTER — Ambulatory Visit: Payer: Self-pay | Admitting: Primary Care

## 2013-06-12 VITALS — BP 116/72 | HR 79 | Temp 97.0°F | Resp 16 | Ht 63.0 in | Wt 122.6 lb

## 2013-06-12 DIAGNOSIS — R300 Dysuria: Secondary | ICD-10-CM

## 2013-06-12 LAB — POCT URINALYSIS DIPSTICK
Bilirubin,Ur: NEGATIVE
Glucose,UA POCT: NORMAL
Ketones,UA POCT: NEGATIVE
Leuk Esterase,UA POCT: 1 — AB
Lot #: 22273601
Nitrite,UA POCT: NEGATIVE
PH,UA POCT: 7 (ref 5–8)
Protein,UA POCT: NEGATIVE mg/dL
Specific gravity,UA POCT: 1.01 (ref 1.002–1.03)
Urobilinogen,UA: NORMAL

## 2013-06-12 MED ORDER — NITROFURANTOIN MONOHYD MACRO 100 MG PO CAPS *I*
100.0000 mg | ORAL_CAPSULE | Freq: Two times a day (BID) | ORAL | Status: AC
Start: 2013-06-12 — End: 2013-06-22

## 2013-06-12 NOTE — Progress Notes (Signed)
SUBJECTIVE:  Was in her usual state of health when she awoke early yesterday morning with increased urinary frequency and urgency. No burning with urination. Symptoms progressed to involve some lower pelvic discomfort which is vague and constant. No vaginal discharge or itching. Feels similar to her previous urinary tract infection she had. She has a history of overactive bladder, but this urinary urgency and frequency is more severe than her baseline. She does take Enablex.  She also has a seizure disorder. No fevers or chills.no back pain.      Patient Active Problem List   Diagnosis Code   . Reported Trauma Neck    . Allergic Rhinitis 477.9   . Irritable bowel syndrome 564.1   . Hypertension 401.9   . Dysplastic Nevus 448.1   . Rosacea 695.3   . Osteoporosis 733.00   . Hypothyroidism 244.9   . Scoliosis 737.30   . Urge Incontinence Of Urine 788.31   . A-V Malformation Repair Supratentorial Complex    . Convulsive Disorder 780.39   . 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 Ringing In The Ears (Tinnitus) 388.30   . GERD (gastroesophageal reflux disease) 530.81   . Contusion of foot 924.20   . Pain in joint, ankle and foot 719.47       Allergies:  Bactrim; Dilantin; Penicillins; Seasonal; and Sulfa drugs    Meds:  Current outpatient prescriptions:TEGRETOL-XR 200 MG 12 hr tablet, TAKE 2 TABLETS TWICE DAILY, Disp: 120 tablet, Rfl: 5;  azelastine (ASTELIN) 137 MCG/SPRAY nasal spray, USE 2 SPRAYS 4 TIMES DAILY AS DIRECTED, Disp: 30 mL, Rfl: 5;  albuterol-ipratropium (COMBIVENT RESPIMAT) 20-100 MCG/ACT inhaler, Inhale 1 puff into the lungs 4 times daily, Disp: 1 Inhaler, Rfl: 5  Calcium Carb-Cholecalciferol 600-200 MG-UNIT TABS, Take 1 tablet by mouth 3 times daily, Disp: 30 each, Rfl: 3;  carBAMazepine (TEGRETOL XR) 100 MG 12 hr tablet, Take 1 tablet (100 mg total) by mouth 2 times daily (with meals)   Along with Carbamazepine 200 mg tabs, Disp: 180 tablet, Rfl: 3;  amLODIPine (NORVASC) 5 MG tablet, TAKE 1  TABLET EVERY DAY, Disp: 90 tablet, Rfl: 4  darifenacin (ENABLEX) 15 MG 24 hr tablet, Take 1 tablet (15 mg total) by mouth daily, Disp: 30 tablet, Rfl: 11;  atorvastatin (LIPITOR) 10 MG tablet, Take 1 tablet (10 mg total) by mouth daily (with dinner), Disp: 30 tablet, Rfl: 5;  levothyroxine (SYNTHROID, LEVOTHROID) 25 MCG tablet, Take 1 tablet (25 mcg total) by mouth daily (before breakfast), Disp: 90 tablet, Rfl: 3  GuaiFENesin (MUCINEX PO), Take 1 tablet by mouth 3 times daily, Disp: , Rfl: ;  Denosumab (PROLIA SC), Inject 1 Device into the skin every 6 months, Disp: , Rfl: ;  loratadine (CLARITIN) 10 MG tablet, Take 10 mg by mouth daily, Disp: , Rfl: ;  Multiple Vitamin (MULTIVITAMIN) per tablet, Take 1 tablet by mouth daily, Disp: , Rfl: ;  metronidazole (NORITATE) 1 % cream, Apply topically daily, Disp: , Rfl:   cholecalciferol (VITAMIN D) 1000 UNITS capsule, Take 1,000 Units by mouth 3 times daily, Disp: , Rfl: ;  fluticasone (FLONASE) 50 MCG/ACT nasal spray, 2 sprays by Nasal route daily, Disp: 45 g, Rfl: 2;  Non-System Medication, Bilateral custom-made orthotics Dx: bilateral foot pain, history of stroke, disp: 1pair, Disp: 1 each, Rfl: 0;  rabeprazole (ACIPHEX) 20 MG tablet, Take 20 mg by mouth daily   , Disp: , Rfl:   NON-SYSTEM MEDICATION *A*, PT:  Evaluate and treat Dx: back  pain, Disp: 8 each, Rfl: 0;    ipratropium (ATROVENT HFA) 17 MCG/ACT inhaler, Inhale 2 puffs into the lungs 4 times daily   Shake well before each use, Disp: 38.7 g, Rfl: 5  lamoTRIgine (LAMICTAL) 25 MG tablet, Take 1 tablet (25 mg total) by mouth daily, Disp: 60 tablet, Rfl: 5  Medication list reviewed and updated today during the visit.    OBJECTIVE:      BP 116/72  Pulse 79  Temp(Src) 36.1 C (97 F) (Temporal)  Resp 16  Ht 1.6 m (5\' 3" )  Wt 55.611 kg (122 lb 9.6 oz)  BMI 21.72 kg/m2  SpO2 100%  Well-appearing elderly woman in no acute distress. No CVA tenderness. Mild suprapubic discomfortupon palpation but no rebound or  guarding. No distended bladder.    Urine dip notable for blood and leukocyte esterase.    ASSESSMENT and PLAN:  1.  Urinary tract infection: Will treat with Macrobid given Cipro can lower the seizure threshold and she is allergic to sulfa. Culture sent.  Continue Enablex for now, but if she continues to get infections this may need to be discontinued.  If culture is negative, consider other etiologies such as bladder stones or interstitial cystitis.        Signed: Virgel Manifold, MD on 06/12/2013 at 8:47 PM  [facility]

## 2013-06-13 ENCOUNTER — Encounter (HOSPITAL_COMMUNITY): Payer: BC Managed Care – PPO

## 2013-06-13 ENCOUNTER — Encounter (HOSPITAL_COMMUNITY)
Admission: RE | Admit: 2013-06-13 | Discharge: 2013-06-13 | Disposition: A | Discharge status: Home / Self Care | Payer: BC Managed Care – PPO | Source: Ambulatory Visit | Attending: Interventional Cardiology | Admitting: Interventional Cardiology

## 2013-06-14 ENCOUNTER — Encounter: Payer: Self-pay | Admitting: Neurology

## 2013-06-14 LAB — AEROBIC CULTURE: Aerobic Culture: 0

## 2013-06-16 ENCOUNTER — Ambulatory Visit: Payer: BC Managed Care – PPO | Admitting: Thoracic Surgery (Cardiothoracic Vascular Surgery)

## 2013-06-16 ENCOUNTER — Encounter (HOSPITAL_COMMUNITY): Payer: BC Managed Care – PPO

## 2013-06-16 ENCOUNTER — Other Ambulatory Visit: Payer: Self-pay | Admitting: Thoracic Surgery (Cardiothoracic Vascular Surgery)

## 2013-06-16 ENCOUNTER — Encounter: Payer: Self-pay | Admitting: Neurology

## 2013-06-16 ENCOUNTER — Ambulatory Visit (INDEPENDENT_AMBULATORY_CARE_PROVIDER_SITE_OTHER): Payer: BC Managed Care – PPO | Admitting: Neurology

## 2013-06-16 ENCOUNTER — Ambulatory Visit (INDEPENDENT_AMBULATORY_CARE_PROVIDER_SITE_OTHER): Payer: Self-pay | Admitting: Physician Assistant

## 2013-06-16 VITALS — BP 136/92 | HR 96 | Ht 67.0 in | Wt 164.0 lb

## 2013-06-16 VITALS — BP 138/98 | HR 96 | Resp 16 | Ht 66.0 in | Wt 162.0 lb

## 2013-06-16 DIAGNOSIS — I340 Nonrheumatic mitral (valve) insufficiency: Secondary | ICD-10-CM

## 2013-06-16 DIAGNOSIS — Z8679 Personal history of other diseases of the circulatory system: Secondary | ICD-10-CM

## 2013-06-16 DIAGNOSIS — Z9889 Other specified postprocedural states: Secondary | ICD-10-CM

## 2013-06-16 DIAGNOSIS — I059 Rheumatic mitral valve disease, unspecified: Secondary | ICD-10-CM

## 2013-06-16 DIAGNOSIS — H532 Diplopia: Secondary | ICD-10-CM

## 2013-06-16 DIAGNOSIS — I4891 Unspecified atrial fibrillation: Secondary | ICD-10-CM

## 2013-06-16 DIAGNOSIS — I898 Other specified noninfective disorders of lymphatic vessels and lymph nodes: Secondary | ICD-10-CM

## 2013-06-16 NOTE — Progress Notes (Signed)
  HPI:  Patient returns for routine wound check having undergone Minimally Invasive MVR/MAZE on 03/18/2013. She was last evaluated by Dr. Cornelius Moras on 04/28/2013 at which time she was doing very well.  She was noted to have a small lymphocele in her right groin.  She presents today for wound check of that area.  The patient states that the area in her groin has gotten bigger.  She states that it is bothersome to her when she does yoga with compression when she lifts her leg.  She states that she feels her thigh has also become more swollen and remains somewhat numb which has been present since surgery.  She also continues to have issues with transient diplopia.  She was evaluated by Neurology this morning who is concerned there could be a possible recent stroke or MS.  She is scheduled to undergo MRA.  Current Outpatient Prescriptions  Medication Sig Dispense Refill  . atorvastatin (LIPITOR) 20 MG tablet Take 20 mg by mouth daily.      . metoprolol succinate (TOPROL-XL) 50 MG 24 hr tablet Take 25 mg by mouth daily. Take with or immediately following a meal.      . warfarin (COUMADIN) 2.5 MG tablet Take 2.5 mg by mouth as directed. Take 3.75mg  (1.5 tablets) on all days except 2.5mg  (1 tablet) on Wednesday and Saturday       No current facility-administered medications for this visit.    Physical Exam:  BP 138/98  Pulse 96  Resp 16  Ht 5\' 6"  (1.676 m)  Wt 162 lb (73.483 kg)  BMI 26.16 kg/m2  SpO2 98%  Gen: no apparent distress Heart: RRR Lungs: CTA Skin: Right groin approximately 3 cm wide lymphocele present, no erythematous no signs of infection   A/P:  1. Enlarged Right Groin Lymphocele- per patient has increased in size since June.  Dr. Cornelius Moras assessed lymphocele and performed bedside drainage.  He was able to drain about 100 cc of serous drainage.  This was sent for culture.  The patient will follow up with Dr. Cornelius Moras as scheduled on 06/30/2013.

## 2013-06-16 NOTE — Progress Notes (Signed)
[facility]  PATIENT: Ariel Simpson DOB: 04/17/1956  HISTORICAL  Ariel Simpson is a 57 years old right-handed Caucasian female, referred by her primary care physician Dr. Cristi Loron and son, for evaluation of transient diplopia.  She had a past medical history of mitral valve repair April 29th 2013, she went into congestive heart failure prior to surgical procedure,  it was a complex valvuloplasty including triangular resection of posterior leaflet, artificial Gore-tex neocord placement and 30mm Sorin Memo 3D ring annuloplasty vis right mini thoracotomy.  She recovered very well from her open heart surgery, she also got a trifocal glasses in June 2014, she had 3 episodes of transient double vision since June  First one was June 12th, she noticed  transient double vision, blurry, her vision improved by closing one eye, symptoms last for minutes, she denied vertigo, no gait difficulty,  One night at dinner, she was sitting at the table, the same thing happened, lasted less than one minute.    July 23rd, she was walking out of the car, into the house, it happened again, transient binocular double vision, she denies motor or sensory deficit  She is taking Coumadin for mitral valve replacement, INR was 1.5 in May, now it is 1.7   Last 2 years, she had essential tremor in her  Hands, right more than left.  REVIEW OF SYSTEMS: Full 14 system review of systems performed and notable only for double vision blurry vision.  ALLERGIES: No Known Allergies  HOME MEDICATIONS: Outpatient Prescriptions Prior to Visit  Medication Sig Dispense Refill  . atorvastatin (LIPITOR) 20 MG tablet Take 20 mg by mouth daily.      . metoprolol succinate (TOPROL-XL) 50 MG 24 hr tablet Take 25 mg by mouth daily. Take with or immediately following a meal.      . warfarin (COUMADIN) 2.5 MG tablet Take 2.5 mg by mouth as directed. Take 3.75mg  (1.5 tablets) on all days except 2.5mg  (1 tablet) on Wednesday  and Saturday      . aspirin 81 MG EC tablet Take 81 mg by mouth daily.       No facility-administered medications prior to visit.    PAST MEDICAL HISTORY: Past Medical History  Diagnosis Date  . Mitral valve prolapse   . High cholesterol   . Heart murmur   . Severe mitral regurgitation 03/03/2013    Acute on chronic with acute heart failure   . Paroxysmal supraventricular tachycardia   . Paroxysmal atrial fibrillation 03/11/2013  . Shortness of breath   . PONV (postoperative nausea and vomiting)     Dizziness and Nausea  . CHF (congestive heart failure)   . Hypertension   .        Marland Kitchen S/P Maze operation for atrial fibrillation 03/18/2013    Complete bilateral atrial lesion set using cryothermy and biopolar radiofrequency ablation with oversewing of LA appendage via right mini thoracotomy    PAST SURGICAL HISTORY: Past Surgical History  Procedure Laterality Date  . Cardiac catheterization    . Wisdom tooth extraction    . Vericose veins    . Abdominal hysterectomy      hernia repair  . Mitral valve repair Right 03/18/2013    Procedure: MINIMALLY INVASIVE MITRAL VALVE REPAIR (MVR);  Surgeon: Purcell Nails, MD;  Location: [facility] OR;  Service: Open Heart Surgery;  Laterality: Right;  . Intraoperative transesophageal echocardiogram N/A 03/18/2013    Procedure: INTRAOPERATIVE TRANSESOPHAGEAL ECHOCARDIOGRAM;  Surgeon: Purcell Nails, MD;  Location: [facility]  OR;  Service: Open Heart Surgery;  Laterality: N/A;  . Maze N/A 03/18/2013    Procedure: MAZE;  Surgeon: Purcell Nails, MD;  Location: [facility] OR;  Service: Open Heart Surgery;  Laterality: N/A;    FAMILY HISTORY: Family History  Problem Relation Age of Onset  . Cancer Mother   . Cancer Father     SOCIAL HISTORY:  History   Social History  . Marital Status: Married    Spouse Name: Peyton Najjar    Number of Children: 3  . Years of Education: BS   Occupational History  .     Social History Main Topics  . Smoking status: Never Smoker    . Smokeless tobacco: Never Used  . Alcohol Use: 0.6 oz/week    1 Glasses of wine per week     Comment: occasional  . Drug Use: No  . Sexually Active: Yes    Social History Narrative   Patient lives at home with her husband  Peyton Najjar). Patient has college education B.S. Patient is a  Production designer, theatre/television/film.   Right handed.   Caffeine- one cup daily.     PHYSICAL EXAM    Filed Vitals:   06/16/13 0822  BP: 136/92  Pulse: 96  Height: 5\' 7"  (1.702 m)  Weight: 164 lb (74.39 kg)    Body mass index is 25.68 kg/(m^2).   Generalized: In no acute distress  Neck: Supple, no carotid bruits   Cardiac: Regular rate rhythm  Pulmonary: Clear to auscultation bilaterally  Musculoskeletal: No deformity  Neurological examination  Mentation: Alert oriented to time, place, history taking, and causual conversation  Cranial nerve II-XII: Pupils were equal round reactive to light extraocular movements were full, visual field were full on confrontational test. facial sensation and strength were normal. hearing was intact to finger rubbing bilaterally. Uvula tongue midline.  head turning and shoulder shrug and were normal and symmetric.Tongue protrusion into cheek strength was normal.  Motor: normal tone, bulk and strength.  Sensory: Intact to fine touch, pinprick, preserved vibratory sensation, and proprioception at toes.  Coordination: Normal finger to nose, heel-to-shin bilaterally there was no truncal ataxia  Gait: Rising up from seated position without assistance, normal stance, without trunk ataxia, moderate stride, good arm swing, smooth turning, able to perform tiptoe, and heel walking without difficulty.   Romberg signs: Negative  Deep tendon reflexes: Brachioradialis 2/2, biceps 2/2, triceps 2/2, patellar 2/2, Achilles 2/2, plantar responses were flexor bilaterally.   DIAGNOSTIC DATA (LABS, IMAGING, TESTING) - I reviewed patient records, labs, notes, testing and imaging myself where  available.  Lab Results  Component Value Date   WBC 8.2 03/21/2013   HGB 8.4* 03/21/2013   HCT 24.2* 03/21/2013   MCV 89.6 03/21/2013   PLT 139* 03/21/2013      Component Value Date/Time   NA 134* 03/21/2013 0540   K 4.4 03/21/2013 0540   CL 98 03/21/2013 0540   CO2 27 03/21/2013 0540   GLUCOSE 101* 03/21/2013 0540   BUN 17 03/21/2013 0540   CREATININE 0.95 03/21/2013 0540   CALCIUM 8.8 03/21/2013 0540   PROT 7.0 03/12/2013 0404   ALBUMIN 3.9 03/12/2013 0404   AST 36 03/12/2013 0404   ALT 39* 03/12/2013 0404   ALKPHOS 96 03/12/2013 0404   BILITOT 0.7 03/12/2013 0404   GFRNONAA 64* 03/21/2013 0540   GFRAA 75* 03/21/2013 0540   No results found for this basename: CHOL, HDL, LDLCALC, LDLDIRECT, TRIG, CHOLHDL   Lab Results  Component Value Date  HGBA1C 5.5 03/18/2013    Lab Results  Component Value Date   TSH 2.043 03/03/2013   ASSESSMENT AND PLAN   57 years old Caucasian female, with past medical history of mitral wall repair, now presenting with 3 episode of binocular double vision, possibility includes small midbrain embolic event.  1. proceed with MRI of the brain, MRA of brain 2. continue Coumadin, keep INR 2-3. 3. return to clinic in one month .        Levert Feinstein, M.D. Ph.D.  [facility] [address] [place], [place] [address] [phone]

## 2013-06-17 ENCOUNTER — Ambulatory Visit: Payer: Self-pay | Admitting: Rehabilitative and Restorative Service Providers"

## 2013-06-17 DIAGNOSIS — M84376A Stress fracture, unspecified foot, initial encounter for fracture: Secondary | ICD-10-CM

## 2013-06-17 DIAGNOSIS — S5290XA Unspecified fracture of unspecified forearm, initial encounter for closed fracture: Secondary | ICD-10-CM

## 2013-06-17 IMAGING — CR DG CHEST 2V
2 series · 2 of 2 positions shown · non-contrast
Comparison: 03/02/2013 and 03/03/2013

CLINICAL DATA: Shortness of breath

CHEST - 2 VIEW

[w chest pa]
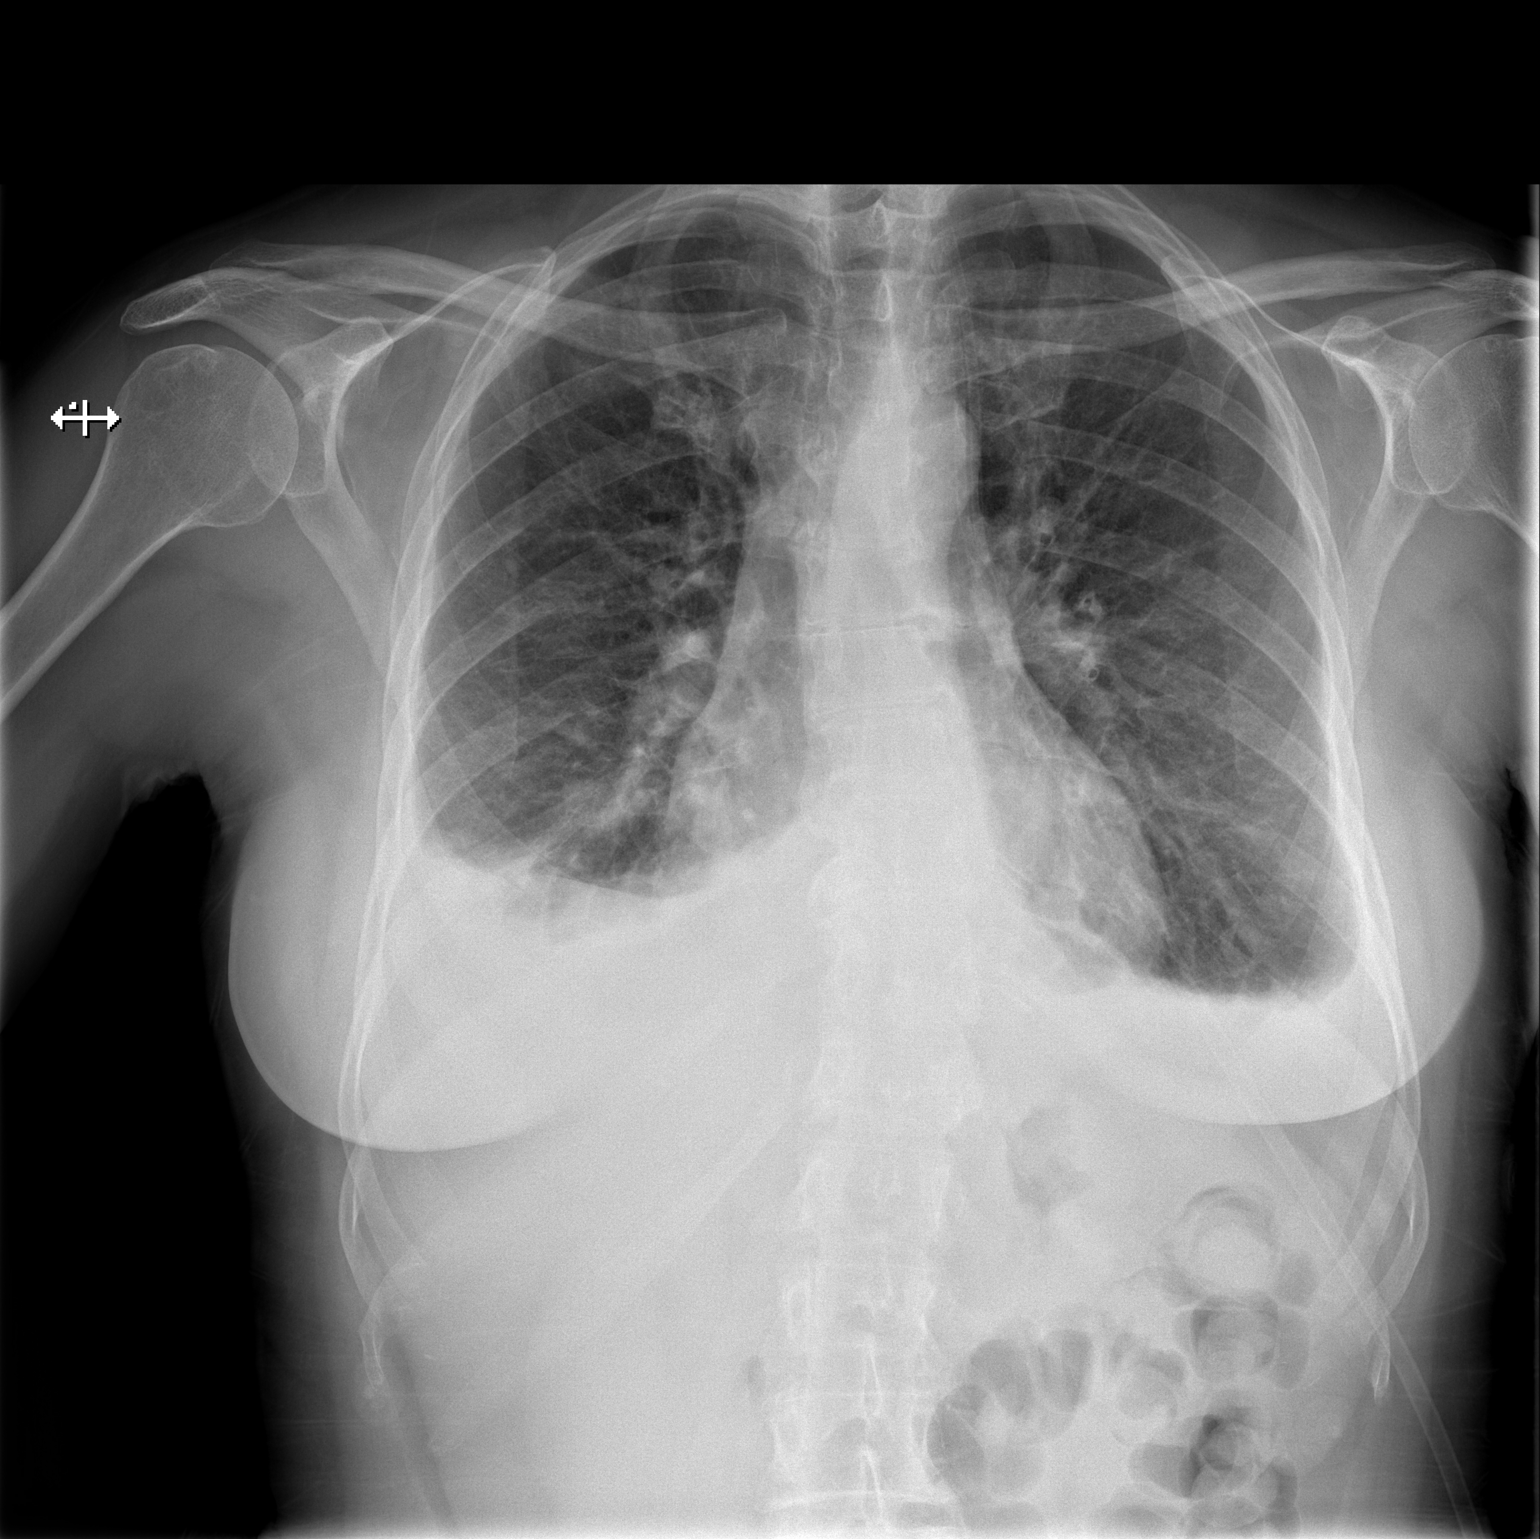

[w chest lat]
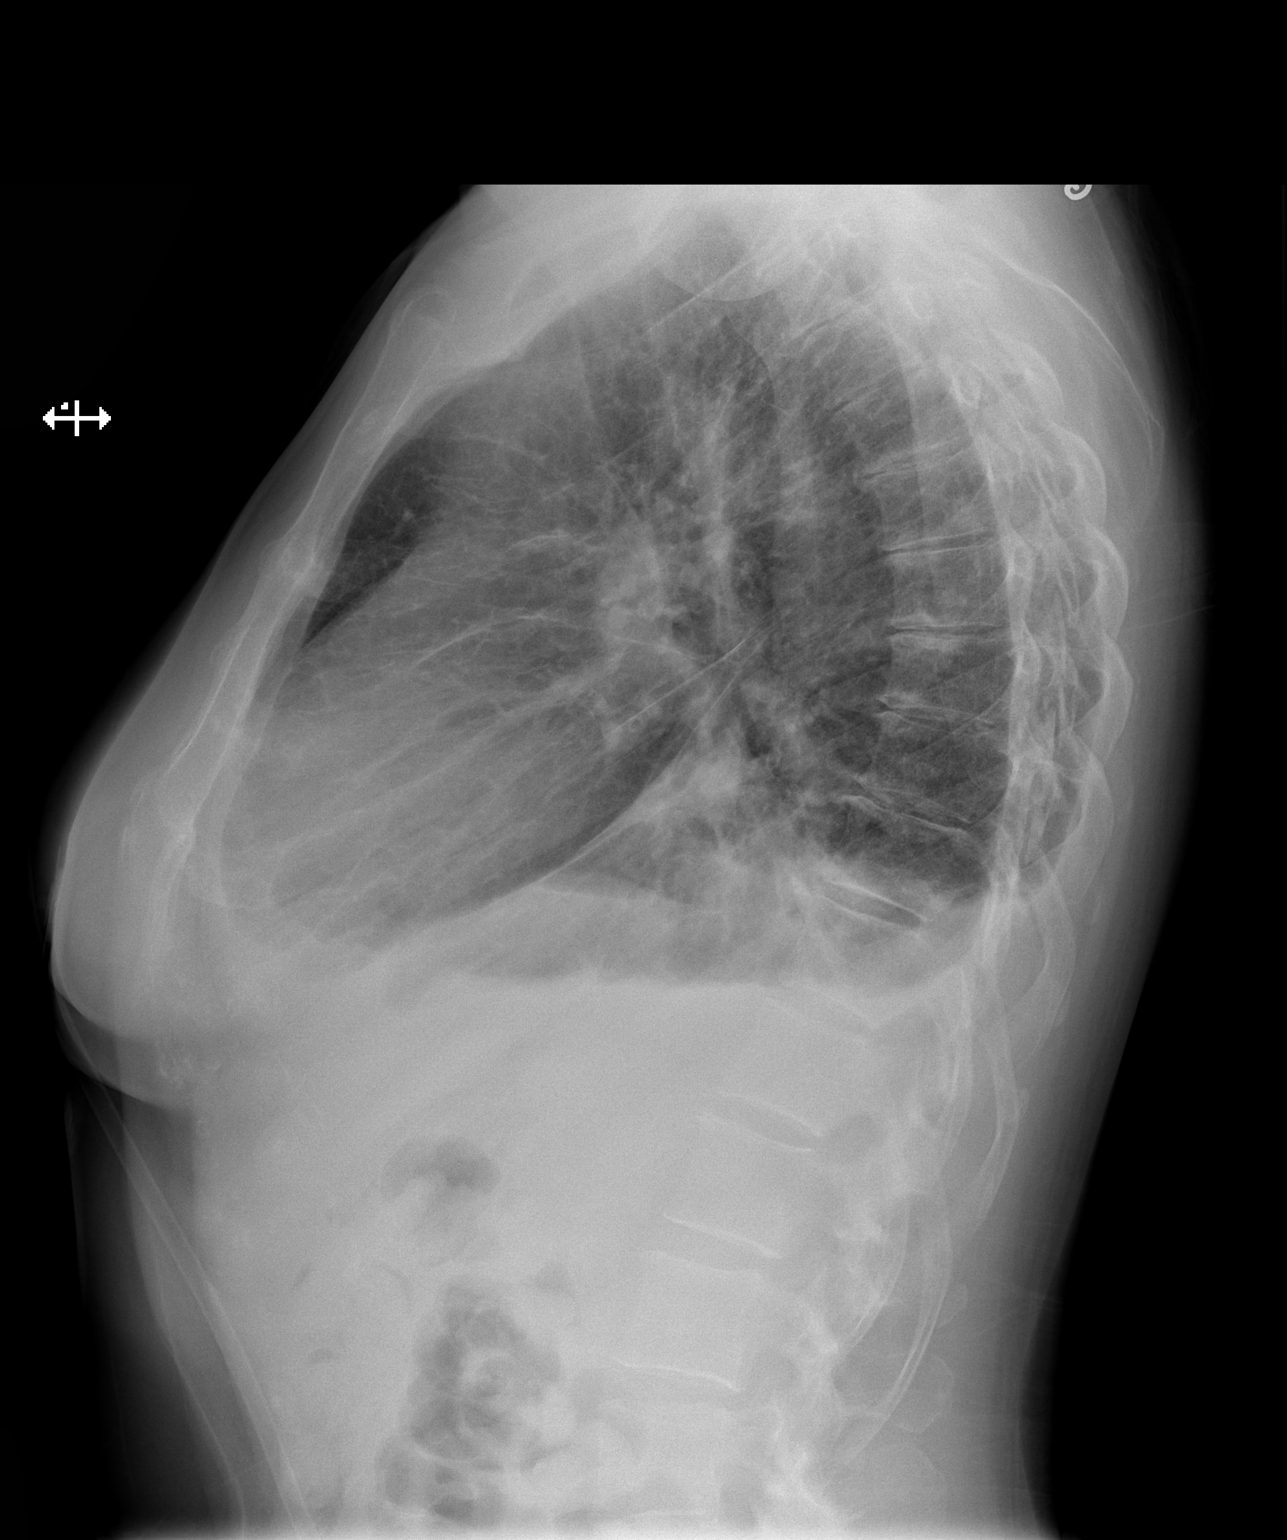

[2 of 2 positions shown; findings below may reference images not displayed]

FINDINGS: Cardiomegaly.  Central vascular congestion.  Mild
infrahilar interstitial prominence.  Small effusions with
associated airspace opacities.  No pneumothorax.  No interval
osseous change.
IMPRESSION: Cardiomegaly with central vascular congestion and mild edema.

Small bilateral pleural effusions (right greater than left) with
associated airspace opacities, favor atelectasis.  Similar to
prior.

## 2013-06-17 NOTE — Progress Notes (Signed)
Orthopedic Sports/Spine  PT Note    [name]   [id number]     Diagnosis: B LE/UE weakness, B wrist fractures    Subjective:  Pain Score:  0  Pain: Unchanged, no complaints of pain today at rest, reports mod forefoot pain with weightbearing      Objective:  ROM - Unchanged, Bilateral, Wrist, AROM  Strength - Ther Ex per flowsheet  Function: - Unchanged  Education:  Verbal cues for ther ex    Objective     see flowsheet    Treatment:  Ther Exercise per flowsheet    Assessment:  Patient responding favorably    Plan of Care:  Continue per Plan of care -  As written    Thank you for referring this patient to [facility]    Loney Hering, PT        Manual stretching wrist flex, ext, sup, pronation  5 min        Gait training  5 min    Agility ladder side steps     Agility ladder  5 min   Hurdle step overs     Marching     Balance training with perturbations         Rockerboard lateral weight shifts with airex     Leg Press     HS curl    LAQ 1.5p 3x10        Rice bucket finger flex/ext     Rice bucket wrist sup/pro     Flexbar wrist flex/ext     Flexbar wrist pro/sup     Dumbbell wrist pro/sup  30x 5lbs        T-band    - B row    - ER        QS     SLR     Standing calf stretch with wedge     Calf raises

## 2013-06-18 ENCOUNTER — Encounter (HOSPITAL_COMMUNITY): Payer: BC Managed Care – PPO

## 2013-06-18 ENCOUNTER — Encounter (HOSPITAL_COMMUNITY)
Admission: RE | Admit: 2013-06-18 | Discharge: 2013-06-18 | Disposition: A | Discharge status: Home / Self Care | Payer: BC Managed Care – PPO | Source: Ambulatory Visit | Attending: Interventional Cardiology | Admitting: Interventional Cardiology

## 2013-06-19 LAB — WOUND CULTURE: Gram Stain: NONE SEEN

## 2013-06-20 ENCOUNTER — Encounter (HOSPITAL_COMMUNITY): Payer: BC Managed Care – PPO

## 2013-06-20 ENCOUNTER — Encounter (HOSPITAL_COMMUNITY)
Admission: RE | Admit: 2013-06-20 | Discharge: 2013-06-20 | Disposition: A | Discharge status: Home / Self Care | Payer: BC Managed Care – PPO | Source: Ambulatory Visit | Attending: Interventional Cardiology | Admitting: Interventional Cardiology

## 2013-06-20 DIAGNOSIS — Z5189 Encounter for other specified aftercare: Secondary | ICD-10-CM | POA: Insufficient documentation

## 2013-06-20 DIAGNOSIS — I509 Heart failure, unspecified: Secondary | ICD-10-CM | POA: Insufficient documentation

## 2013-06-20 DIAGNOSIS — I5031 Acute diastolic (congestive) heart failure: Secondary | ICD-10-CM | POA: Insufficient documentation

## 2013-06-20 DIAGNOSIS — I059 Rheumatic mitral valve disease, unspecified: Secondary | ICD-10-CM | POA: Insufficient documentation

## 2013-06-20 DIAGNOSIS — I4891 Unspecified atrial fibrillation: Secondary | ICD-10-CM | POA: Insufficient documentation

## 2013-06-23 ENCOUNTER — Encounter (HOSPITAL_COMMUNITY)
Admission: RE | Admit: 2013-06-23 | Discharge: 2013-06-23 | Disposition: A | Discharge status: Home / Self Care | Payer: BC Managed Care – PPO | Source: Ambulatory Visit | Attending: Interventional Cardiology | Admitting: Interventional Cardiology

## 2013-06-23 ENCOUNTER — Encounter (HOSPITAL_COMMUNITY): Payer: BC Managed Care – PPO

## 2013-06-25 ENCOUNTER — Encounter (HOSPITAL_COMMUNITY): Payer: BC Managed Care – PPO

## 2013-06-25 ENCOUNTER — Encounter (HOSPITAL_COMMUNITY)
Admission: RE | Admit: 2013-06-25 | Discharge: 2013-06-25 | Disposition: A | Discharge status: Home / Self Care | Payer: BC Managed Care – PPO | Source: Ambulatory Visit | Attending: Interventional Cardiology | Admitting: Interventional Cardiology

## 2013-06-25 IMAGING — CR DG CHEST 1V PORT
1 series · 1 of 1 positions shown · non-contrast
Comparison: Chest 03/20/2013.

CLINICAL DATA: Status post mitral valvuloplasty and Maze procedure.

PORTABLE CHEST - 1 VIEW

[AP]
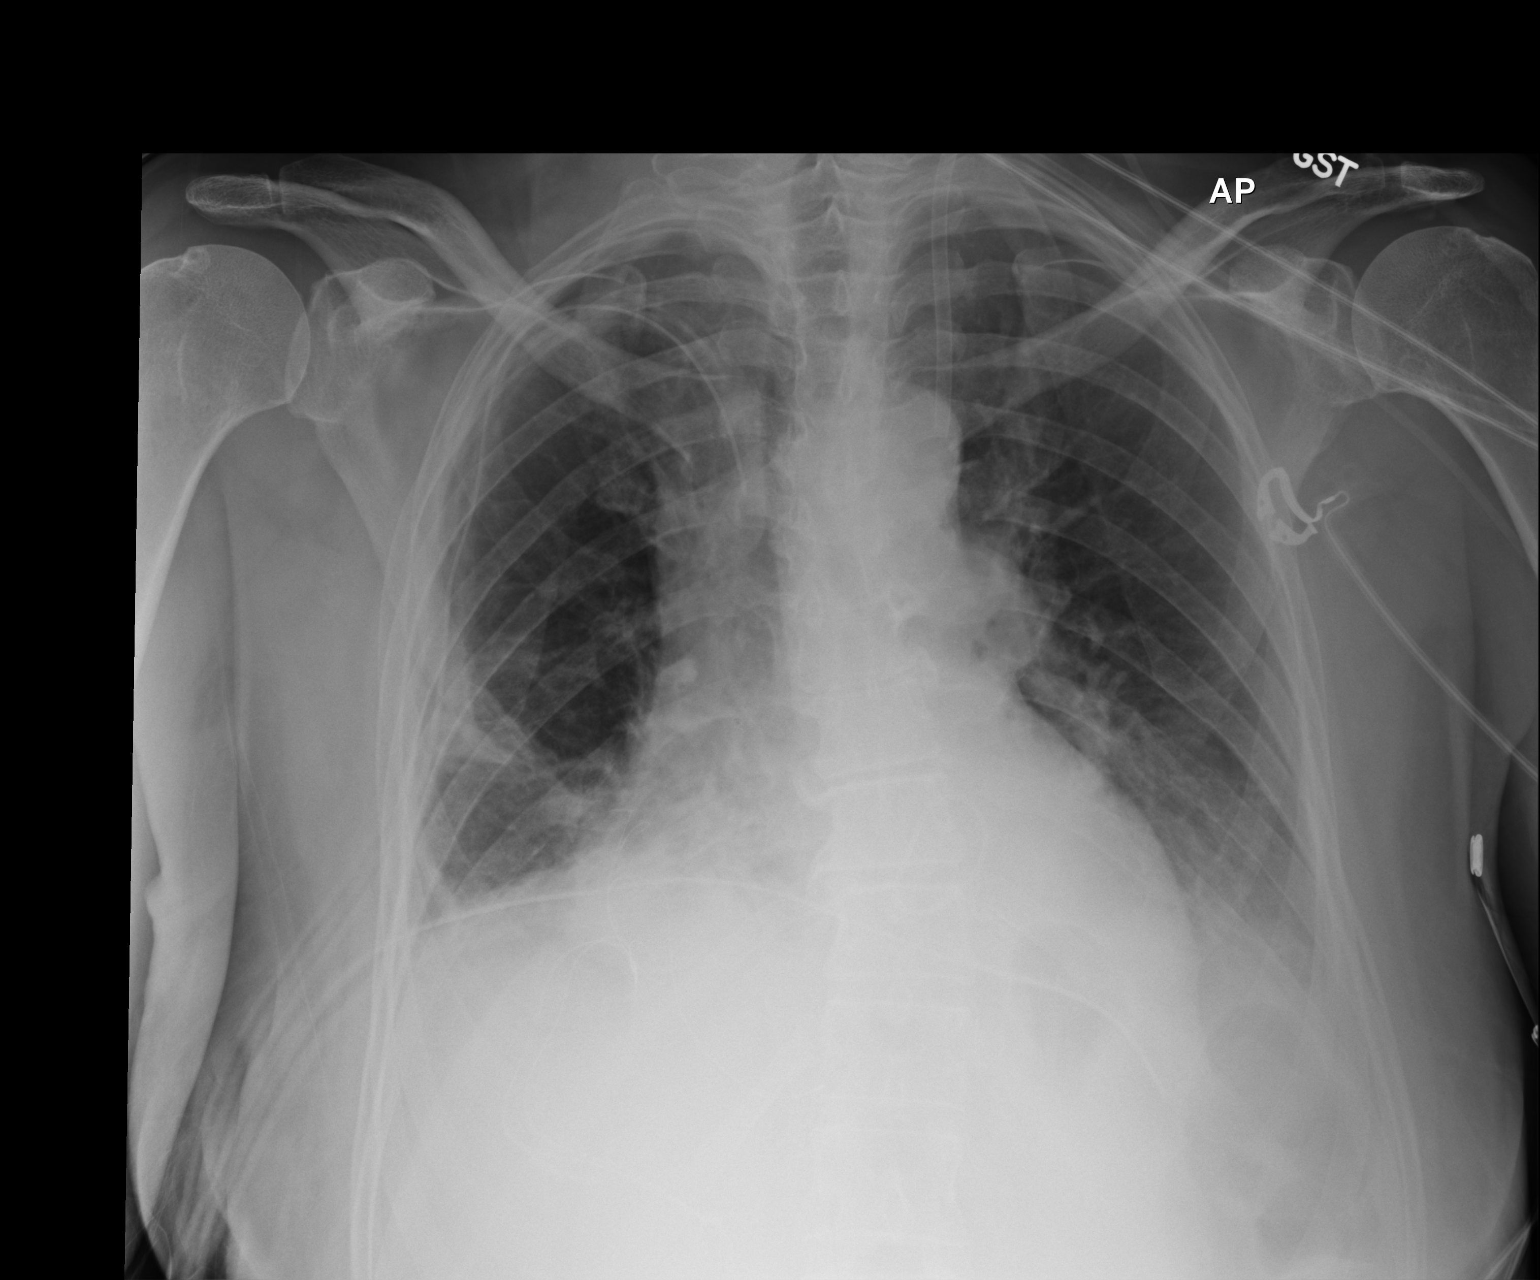

[1 of 1 positions shown; findings below may reference images not displayed]

FINDINGS: Swan-Ganz catheter has been removed. Left IJ sheath
remains in place.  Right chest tube and pericardial drain remain in
place.  Bibasilar subsegmental atelectasis persists without marked
change.  No pneumothorax is identified.  There is cardiomegaly.
IMPRESSION: 1.  Status post removal of Swan-Ganz catheter.
2.  Right chest tube and pericardial drain.  Negative for
pneumothorax.
3.  No change in bibasilar atelectasis.

## 2013-06-26 ENCOUNTER — Other Ambulatory Visit: Payer: Self-pay | Admitting: Primary Care

## 2013-06-26 ENCOUNTER — Ambulatory Visit: Payer: Self-pay | Admitting: Rehabilitative and Restorative Service Providers"

## 2013-06-26 DIAGNOSIS — S5290XA Unspecified fracture of unspecified forearm, initial encounter for closed fracture: Secondary | ICD-10-CM

## 2013-06-26 DIAGNOSIS — M84376A Stress fracture, unspecified foot, initial encounter for fracture: Secondary | ICD-10-CM

## 2013-06-26 NOTE — Progress Notes (Signed)
Orthopedic Sports/Spine  PT Note    [name]   [id number]     Diagnosis: B LE/UE weakness, B wrist fractures    Subjective:  Pain Score:  0  Pain: Improved, decreased complaints forefoot pain       Objective:  ROM - Unchanged, Bilateral, Wrist, AROM  Strength - Ther Ex per flowsheet  Function: - Unchanged  Education:  Verbal cues for ther ex    Objective     see flowsheet    Treatment:  Ther Exercise per flowsheet    Assessment:  Patient responding favorably    Plan of Care:  Continue per Plan of care -  As written    Thank you for referring this patient to [facility]    Loney Hering, PT        Manual stretching wrist flex, ext, sup, pronation  5 min        Gait training  5 min    Agility ladder side steps     Agility ladder  5 min   Hurdle step overs     Marching     Balance training with perturbations         Rockerboard lateral weight shifts with airex     Leg Press  4.5p 3x10   HS curl    LAQ 1.5p 3x10        Rice bucket finger flex/ext     Rice bucket wrist sup/pro     Flexbar wrist flex/ext     Flexbar wrist pro/sup     Dumbbell wrist pro/sup  30x 5lbs        T-band    - B row    - ER        QS     SLR     Standing calf stretch with wedge     Calf raises

## 2013-06-27 ENCOUNTER — Encounter (HOSPITAL_COMMUNITY): Payer: BC Managed Care – PPO

## 2013-06-27 IMAGING — CR DG CHEST 2V
2 series · 2 of 2 positions shown · non-contrast
Comparison: 03/20/2013

CLINICAL DATA: Atelectasis and pleural effusions, cough, congestion
and

CHEST - 2 VIEW

[w chest pa]
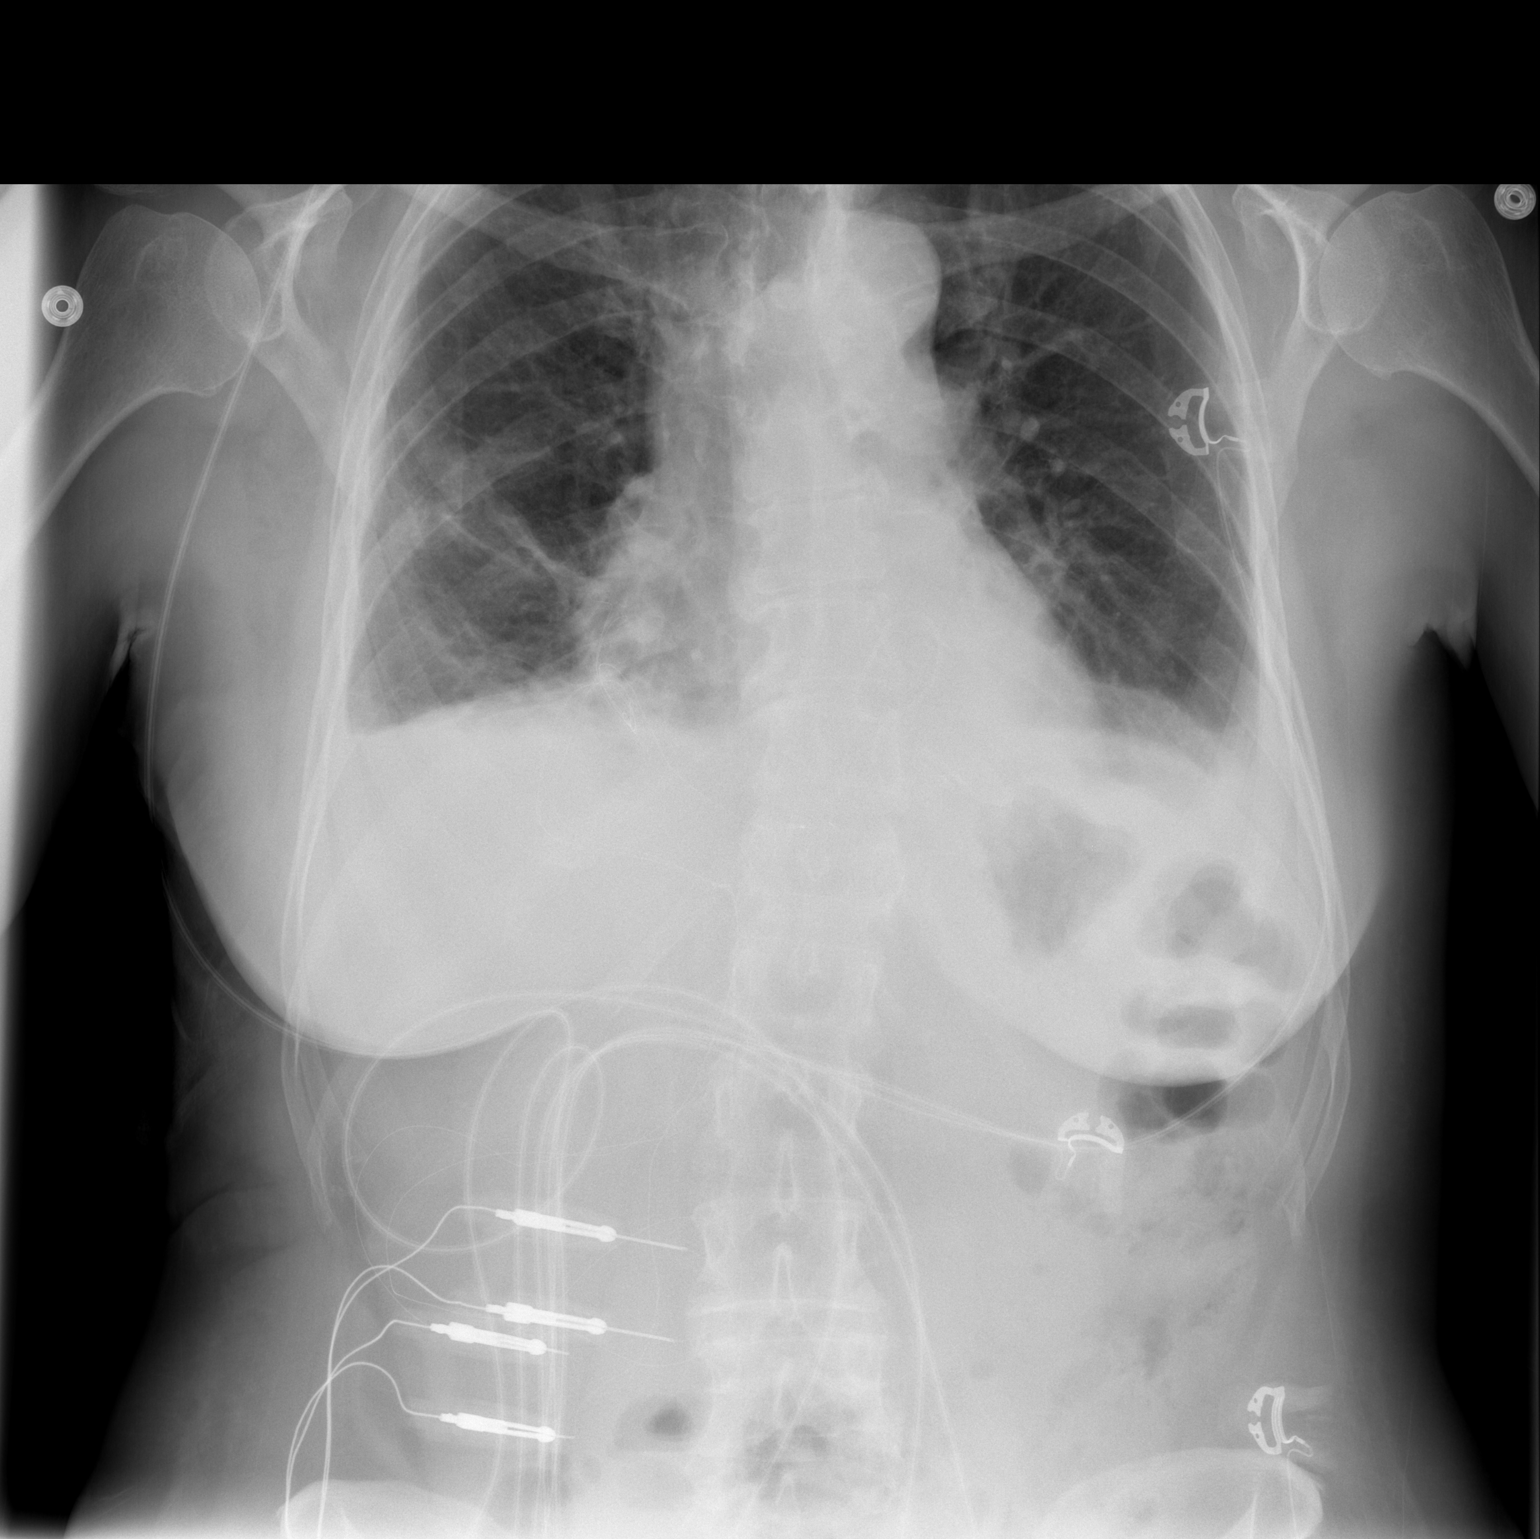

[w chest lat]
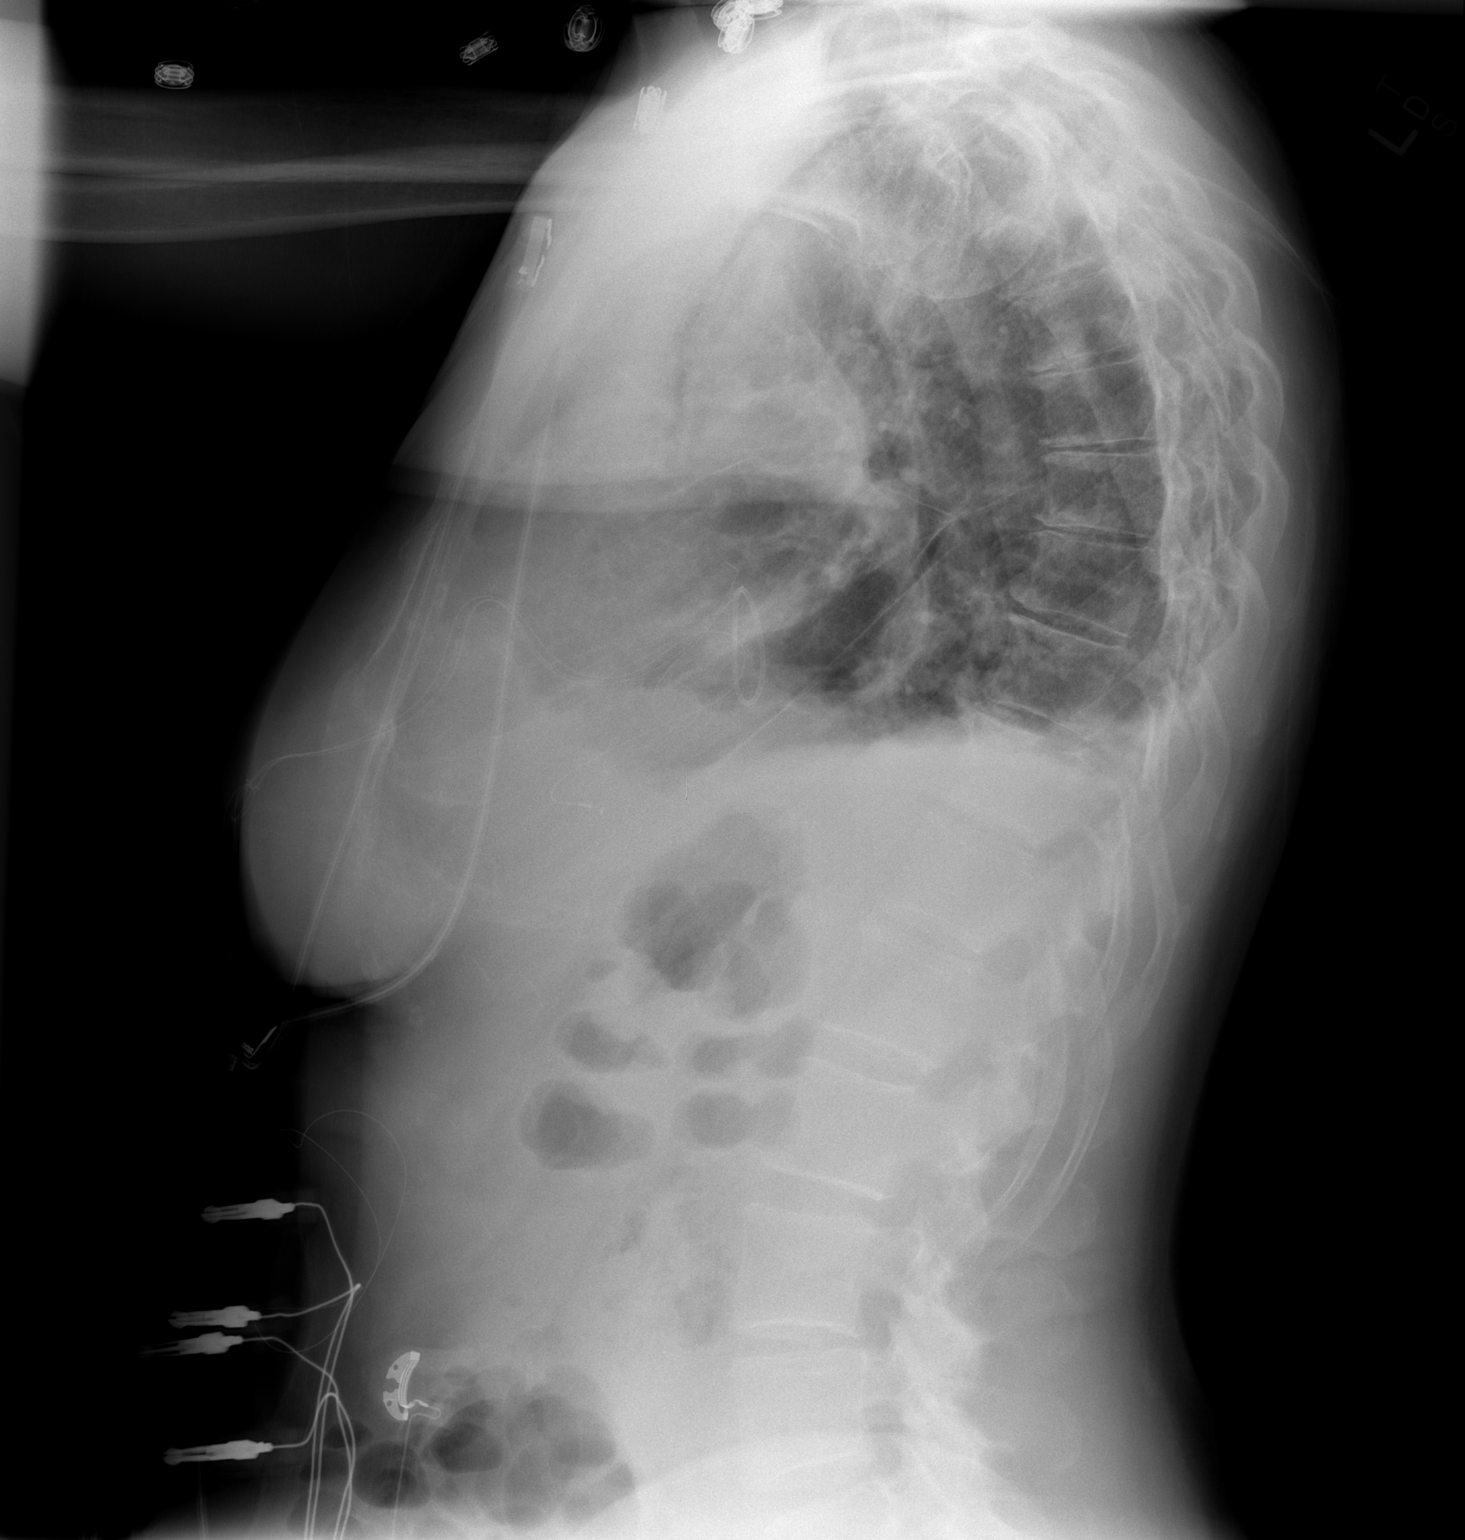

[2 of 2 positions shown; findings below may reference images not displayed]

FINDINGS: Left IJ vascular sheath and right chest tubes have been
removed.  Epicardial pacing wires remain.  Mitral valve replacement
noted.  Stable cardiomegaly with vascular congestion.  Improving
basilar atelectasis pattern with residual small effusions.  No
pneumothorax.  Trachea is midline.
IMPRESSION: Improving basilar atelectasis.  Residual small bilateral pleural
effusions.

## 2013-06-30 ENCOUNTER — Encounter (HOSPITAL_COMMUNITY): Payer: BC Managed Care – PPO

## 2013-06-30 ENCOUNTER — Encounter (HOSPITAL_COMMUNITY)
Admission: RE | Admit: 2013-06-30 | Discharge: 2013-06-30 | Disposition: A | Discharge status: Home / Self Care | Payer: BC Managed Care – PPO | Source: Ambulatory Visit | Attending: Interventional Cardiology | Admitting: Interventional Cardiology

## 2013-06-30 ENCOUNTER — Encounter: Payer: Self-pay | Admitting: Thoracic Surgery (Cardiothoracic Vascular Surgery)

## 2013-06-30 ENCOUNTER — Ambulatory Visit (INDEPENDENT_AMBULATORY_CARE_PROVIDER_SITE_OTHER): Payer: BC Managed Care – PPO | Admitting: Thoracic Surgery (Cardiothoracic Vascular Surgery)

## 2013-06-30 VITALS — BP 149/108 | HR 93 | Resp 16 | Ht 66.0 in | Wt 162.0 lb

## 2013-06-30 DIAGNOSIS — Z8679 Personal history of other diseases of the circulatory system: Secondary | ICD-10-CM

## 2013-06-30 DIAGNOSIS — I4891 Unspecified atrial fibrillation: Secondary | ICD-10-CM

## 2013-06-30 DIAGNOSIS — I898 Other specified noninfective disorders of lymphatic vessels and lymph nodes: Secondary | ICD-10-CM | POA: Insufficient documentation

## 2013-06-30 DIAGNOSIS — Z9889 Other specified postprocedural states: Secondary | ICD-10-CM

## 2013-06-30 DIAGNOSIS — I340 Nonrheumatic mitral (valve) insufficiency: Secondary | ICD-10-CM

## 2013-06-30 DIAGNOSIS — I059 Rheumatic mitral valve disease, unspecified: Secondary | ICD-10-CM

## 2013-06-30 NOTE — Progress Notes (Signed)
      [address]       [place] [address]             [phone]     CARDIOTHORACIC SURGERY OFFICE NOTE  Referring Provider is [name], MD PCP is Leanor Rubenstein, MD   HPI:  Patient returns for routine followup status post minimally invasive mitral valve repair and Maze procedure 03/18/2013.  She was last seen in our office on 06/16/2013 which time she was noted to have a small lymphocele in the right groin. This was aspirated. She returns for office for followup today. She notes that after her lymphocele was aspirated the swelling in her right groin returned within 24 hours. There is no associated pain or tenderness. She otherwise is doing quite well. She has not had any further dizzy spells or episodes of double vision. She has been seen by a neurologist and is reportedly going to have an MRI performed although this has not yet been scheduled. She had a followup echocardiogram performed last week, although the results are not yet available.  Current Outpatient Prescriptions  Medication Sig Dispense Refill  . atorvastatin (LIPITOR) 20 MG tablet Take 20 mg by mouth daily.      . metoprolol succinate (TOPROL-XL) 50 MG 24 hr tablet Take 25 mg by mouth daily. Take with or immediately following a meal.      . warfarin (COUMADIN) 2.5 MG tablet Take 2.5 mg by mouth as directed. Take 3.75mg  (1.5 tablets) on all days except 2.5mg  (1 tablet) on Wednesday and Saturday       No current facility-administered medications for this visit.      Physical Exam:   BP 149/108  Pulse 93  Resp 16  Ht 5\' 6"  (1.676 m)  Wt 162 lb (73.483 kg)  BMI 26.16 kg/m2  SpO2 98%  General:  Well-appearing  Chest:   Clear to auscultation  CV:   Regular rate and rhythm without murmur  Incisions:  Completely healed. Right groin lymphocele has recurred  Abdomen:  Soft and nontender  Extremities:  Warm and well-perfused with no lower extremity edema  Diagnostic Tests:  Right groin  lymphocele is aspirated under local anesthesia. A total of 95 mL of thin serous fluid is evacuated.   Impression:  Recurrent right groin lymphocele  Plan:  Options were discussed regarding management of the lymphocele including conservative observation, repeat aspiration, Pleurx catheter placement, open surgical repair with or without wound VAC placement. We plan to proceed with weekly aspiration for the time being to see if this will limit the problem. The patient will return in one week.   Salvatore Decent. Cornelius Moras, MD 06/30/2013 11:55 AM

## 2013-07-02 ENCOUNTER — Encounter (HOSPITAL_COMMUNITY): Payer: BC Managed Care – PPO

## 2013-07-02 ENCOUNTER — Encounter (HOSPITAL_COMMUNITY)
Admission: RE | Admit: 2013-07-02 | Discharge: 2013-07-02 | Disposition: A | Discharge status: Home / Self Care | Payer: BC Managed Care – PPO | Source: Ambulatory Visit | Attending: Interventional Cardiology | Admitting: Interventional Cardiology

## 2013-07-04 ENCOUNTER — Encounter (HOSPITAL_COMMUNITY): Payer: BC Managed Care – PPO

## 2013-07-04 ENCOUNTER — Encounter (HOSPITAL_COMMUNITY)
Admission: RE | Admit: 2013-07-04 | Discharge: 2013-07-04 | Disposition: A | Discharge status: Home / Self Care | Payer: BC Managed Care – PPO | Source: Ambulatory Visit | Attending: Interventional Cardiology | Admitting: Interventional Cardiology

## 2013-07-07 ENCOUNTER — Encounter (HOSPITAL_COMMUNITY): Payer: BC Managed Care – PPO

## 2013-07-07 ENCOUNTER — Encounter (HOSPITAL_COMMUNITY)
Admission: RE | Admit: 2013-07-07 | Discharge: 2013-07-07 | Disposition: A | Discharge status: Home / Self Care | Payer: BC Managed Care – PPO | Source: Ambulatory Visit | Attending: Interventional Cardiology | Admitting: Interventional Cardiology

## 2013-07-08 ENCOUNTER — Ambulatory Visit: Payer: Self-pay

## 2013-07-08 ENCOUNTER — Ambulatory Visit: Payer: Self-pay | Admitting: Rehabilitative and Restorative Service Providers"

## 2013-07-08 ENCOUNTER — Ambulatory Visit (INDEPENDENT_AMBULATORY_CARE_PROVIDER_SITE_OTHER): Payer: BC Managed Care – PPO | Admitting: Thoracic Surgery (Cardiothoracic Vascular Surgery)

## 2013-07-08 ENCOUNTER — Encounter: Payer: Self-pay | Admitting: Thoracic Surgery (Cardiothoracic Vascular Surgery)

## 2013-07-08 VITALS — BP 140/105 | HR 83 | Resp 20 | Ht 66.0 in | Wt 162.0 lb

## 2013-07-08 DIAGNOSIS — I898 Other specified noninfective disorders of lymphatic vessels and lymph nodes: Secondary | ICD-10-CM

## 2013-07-08 DIAGNOSIS — I340 Nonrheumatic mitral (valve) insufficiency: Secondary | ICD-10-CM

## 2013-07-08 DIAGNOSIS — Z9889 Other specified postprocedural states: Secondary | ICD-10-CM

## 2013-07-08 DIAGNOSIS — I059 Rheumatic mitral valve disease, unspecified: Secondary | ICD-10-CM

## 2013-07-08 NOTE — Progress Notes (Signed)
      [address]       [name] [address]             [phone]     CARDIOTHORACIC SURGERY OFFICE NOTE  Referring Provider is [name], MD PCP is Leanor Rubenstein, MD   HPI:  Patient returns for routine followup status post minimally invasive mitral valve repair and Maze procedure 03/18/2013. She was last seen in our office on 06/30/2013 which time she underwent aspiration of the small lymphocele in her right groin. She returns for office for followup today. She notes that after her lymphocele was aspirated the swelling in her right groin returned to approximately only 1/2 the previous size.  There is no associated pain or tenderness. She otherwise is doing quite well. She has not had any further dizzy spells or episodes of double vision. She has been seen by a neurologist and is reportedly going to have an MRI performed tomorrow.  She had a followup echocardiogram performed last week demonstrating normal LV function with intact mitral valve repair and no residual mitral regurgitation.    Current Outpatient Prescriptions  Medication Sig Dispense Refill  . atorvastatin (LIPITOR) 20 MG tablet Take 20 mg by mouth daily.      . metoprolol succinate (TOPROL-XL) 50 MG 24 hr tablet Take 25 mg by mouth daily. Take with or immediately following a meal.      . warfarin (COUMADIN) 2.5 MG tablet Take 2.5 mg by mouth as directed. Take 3.75mg  (1.5 tablets) on all days except 2.5mg  (1 tablet) on Wednesday and Saturday       No current facility-administered medications for this visit.      Physical Exam:   BP 140/105  Pulse 83  Resp 20  Ht 5\' 6"  (1.676 m)  Wt 162 lb (73.483 kg)  BMI 26.16 kg/m2  SpO2 99%  General:  Well-appearing  Chest:   Clear to auscultation  CV:   Regular rate and rhythm without murmur  Incisions:  Completely healed, small right groin lymphocele has recurred  Abdomen:  Soft nontender  Extremities:  Warm and well-perfused  Procedure  Note:  Right groin lymphocele was aspirated under local anesthesia. A total of 55 mL of thin serous fluid is evacuated.   Impression:  Recurrent right groin lymphocele which appears to be getting smaller  Plan:  The patient will return in one week for repeat aspiration.   Salvatore Decent. Cornelius Moras, MD 07/08/2013 5:13 PM

## 2013-07-09 ENCOUNTER — Ambulatory Visit (INDEPENDENT_AMBULATORY_CARE_PROVIDER_SITE_OTHER): Payer: BC Managed Care – PPO

## 2013-07-09 ENCOUNTER — Encounter (HOSPITAL_COMMUNITY)
Admission: RE | Admit: 2013-07-09 | Discharge: 2013-07-09 | Disposition: A | Discharge status: Home / Self Care | Payer: BC Managed Care – PPO | Source: Ambulatory Visit | Attending: Interventional Cardiology | Admitting: Interventional Cardiology

## 2013-07-09 ENCOUNTER — Encounter (HOSPITAL_COMMUNITY): Payer: BC Managed Care – PPO

## 2013-07-09 DIAGNOSIS — H532 Diplopia: Secondary | ICD-10-CM

## 2013-07-09 DIAGNOSIS — Z8679 Personal history of other diseases of the circulatory system: Secondary | ICD-10-CM

## 2013-07-09 DIAGNOSIS — I340 Nonrheumatic mitral (valve) insufficiency: Secondary | ICD-10-CM

## 2013-07-09 DIAGNOSIS — Z9889 Other specified postprocedural states: Secondary | ICD-10-CM

## 2013-07-11 ENCOUNTER — Other Ambulatory Visit: Payer: Self-pay | Admitting: Primary Care

## 2013-07-11 ENCOUNTER — Encounter (HOSPITAL_COMMUNITY)
Admission: RE | Admit: 2013-07-11 | Discharge: 2013-07-11 | Disposition: A | Discharge status: Home / Self Care | Payer: BC Managed Care – PPO | Source: Ambulatory Visit | Attending: Interventional Cardiology | Admitting: Interventional Cardiology

## 2013-07-11 ENCOUNTER — Encounter (HOSPITAL_COMMUNITY): Payer: BC Managed Care – PPO

## 2013-07-11 DIAGNOSIS — E039 Hypothyroidism, unspecified: Secondary | ICD-10-CM

## 2013-07-11 NOTE — Progress Notes (Signed)
Quick Note:  Please call patient MRI of brain showed small vessel disease, MRA of brain was normal. No change in treatment plan. ______

## 2013-07-14 ENCOUNTER — Ambulatory Visit (INDEPENDENT_AMBULATORY_CARE_PROVIDER_SITE_OTHER): Payer: BC Managed Care – PPO | Admitting: Thoracic Surgery (Cardiothoracic Vascular Surgery)

## 2013-07-14 ENCOUNTER — Encounter (HOSPITAL_COMMUNITY): Payer: BC Managed Care – PPO

## 2013-07-14 ENCOUNTER — Encounter: Payer: Self-pay | Admitting: Thoracic Surgery (Cardiothoracic Vascular Surgery)

## 2013-07-14 ENCOUNTER — Encounter (HOSPITAL_COMMUNITY)
Admission: RE | Admit: 2013-07-14 | Discharge: 2013-07-14 | Disposition: A | Discharge status: Home / Self Care | Payer: BC Managed Care – PPO | Source: Ambulatory Visit | Attending: Interventional Cardiology | Admitting: Interventional Cardiology

## 2013-07-14 VITALS — BP 146/105 | HR 83 | Resp 16 | Ht 67.0 in | Wt 162.0 lb

## 2013-07-14 DIAGNOSIS — I4891 Unspecified atrial fibrillation: Secondary | ICD-10-CM

## 2013-07-14 DIAGNOSIS — Z9889 Other specified postprocedural states: Secondary | ICD-10-CM

## 2013-07-14 DIAGNOSIS — Z8679 Personal history of other diseases of the circulatory system: Secondary | ICD-10-CM

## 2013-07-14 DIAGNOSIS — I898 Other specified noninfective disorders of lymphatic vessels and lymph nodes: Secondary | ICD-10-CM

## 2013-07-14 NOTE — Progress Notes (Signed)
      [address]       [name] [address]             [phone]     CARDIOTHORACIC SURGERY OFFICE NOTE  Referring Provider is [name], MD PCP is Leanor Rubenstein, MD   HPI:  Patient returns for routine followup status post minimally invasive mitral valve repair and Maze procedure 03/18/2013. She was last seen in our office on 07/08/2013 which time she underwent aspiration of the small lymphocele in her right groin. She returns for office for followup today. She notes that after her lymphocele was aspirated the swelling in her right groin returned to approximately only 1/2 the previous size. There is no associated pain or tenderness. She otherwise is doing quite well.     Current Outpatient Prescriptions  Medication Sig Dispense Refill  . atorvastatin (LIPITOR) 20 MG tablet Take 20 mg by mouth daily.      . metoprolol succinate (TOPROL-XL) 50 MG 24 hr tablet Take 25 mg by mouth daily. Take with or immediately following a meal.      . warfarin (COUMADIN) 2.5 MG tablet Take 2.5 mg by mouth as directed. Take 3.75mg  (1.5 tablets) on all days except 2.5mg  (1 tablet) on Wednesday and Saturday       No current facility-administered medications for this visit.      Physical Exam:   BP 146/105  Pulse 83  Resp 16  Ht 5\' 7"  (1.702 m)  Wt 162 lb (73.483 kg)  BMI 25.37 kg/m2  SpO2 98%  General:  Well-appearing  Chest:   Clear to auscultation  CV:   Regular rate and rhythm without murmur  Incisions:  Healing nicely, small right groin lymphocele recurs but appears notably smaller than last week  Abdomen:  Soft nontender  Extremities:  Warm and well-perfused with no lower extremity edema   Procedure Note:  Right groin lymphocele was aspirated under local anesthesia. A total of 27 mL of thin serous fluid is evacuated    Impression:  Right groin lymphocele continues to resolve with repeat aspiration.  Plan:  The patient will return in 2 weeks for  repeat aspiration   Salvatore Decent. Cornelius Moras, MD 07/14/2013 4:30 PM

## 2013-07-15 ENCOUNTER — Ambulatory Visit: Payer: Self-pay | Admitting: Rehabilitative and Restorative Service Providers"

## 2013-07-15 DIAGNOSIS — S5290XA Unspecified fracture of unspecified forearm, initial encounter for closed fracture: Secondary | ICD-10-CM

## 2013-07-15 DIAGNOSIS — M84376A Stress fracture, unspecified foot, initial encounter for fracture: Secondary | ICD-10-CM

## 2013-07-15 NOTE — Progress Notes (Signed)
SPORTS REHABILITATION PT LE STATUS      Subjective    [name] is a 57 y.o. female who is present today for continued treatment of her B UE/LE weakness and balance deficits.  Mechanism of injury/history of symptoms: No specific cause  Reports that she is gradually transitioning to her new orthotics.  Reports continued balance deficits with walking and looking to either direction    Attendance:  Good    Compliance:  Good   Relevant symptoms: Pain , Decreased ROM, Decreased strength, Instability, balance deficits    Symptom frequency: Persistent, wrist pain L>R with pulling off to transfer sit to stand  Symptom intensity (0 - 10 scale): Now 0 Best 0 Worst 2  Complains of L > R wrist tightness/soreness with ADL's      Patient's goals for therapy: Reduce pain, Increase ROM, Increase strength, Independent with home program, improve balance     Objective:    Observation: WNL   Palpation:  WNL     ROM/Strength:    bilateral knee, bilateral ankle          LE LEFT   RIGHT   Strength    PROM AROM PROM AROM Left Right   Knee ext     4/5 4/5   Knee flex     4/5 4/5   Ankle DF     4/5 4/5   Ankle PF     4/5 4/5            Wrist flex     4/5 4/5   Wrist ext     4/5 4/5   Wrist sup/pro     4/5 4/5     Continued inability to maintain SLS for greater than 5 sec on L or R LE's      Special Tests:   Hip: N/A     Knee: N/A     Ankle: N/A     Functional:  Perform self-care activities/basic ADLs - able to perform.  Walk more than 10 minutes - able to perform.  Ascend stairs with reciprocal gait - unable to perform.  Descend stairs with reciprocal gait - unable to perform.    Assessment  Findings consistent with 57 y.o., female with   Encounter Diagnoses   Name Primary?   . Stress fracture of the metatarsals Yes   . Closed fracture of unspecified part of radius with ulna(813.83)      with ROM limitations, strength limitations, functional limitations, balance deficits    Prognosis:  Fair    Contraindications/Precautions/Limitation:   Per diagnosis  Long Term Goals:  Pain/Sx 0 - minimal, ROM/ flexibility WNL , Restoration of functional strength, Functional return to ADLs / activites without limitation        Treatment Plan:   Patient/family involved in developing goals and treatment plan: yes  Freq 1 times per week(s) for 2 month(s)     Exercise: AROM, AAROM, PROM, Progressive Resistive   Manual Techniques:  N/A   Modalities:  NA    Functional: Proprioception/Dynamic stability, Functional rehab       Thank you for referring this patient to [facility].    Loney Hering, PT        Manual stretching wrist flex, ext, sup, pronation  5 min        Gait training  5 min    Agility ladder side steps     Agility ladder  5 min   Hurdle step overs  Marching     Balance training with perturbations  5 min       Rockerboard lateral weight shifts with airex     Leg Press  4.5p 3x10   HS curl    LAQ 1.5p 3x10        Rice bucket finger flex/ext     Rice bucket wrist sup/pro     Flexbar wrist flex/ext     Flexbar wrist pro/sup     Dumbbell wrist pro/sup  30x 5lbs        T-band    - B row    - ER

## 2013-07-15 NOTE — Progress Notes (Signed)
Quick Note:  Spoke with patient and relayed MRI and MRA brain results, per Dr. Terrace Arabia. Further details will be discussed on follow up visit next week. ______

## 2013-07-16 ENCOUNTER — Encounter (HOSPITAL_COMMUNITY)
Admission: RE | Admit: 2013-07-16 | Discharge: 2013-07-16 | Disposition: A | Discharge status: Home / Self Care | Payer: BC Managed Care – PPO | Source: Ambulatory Visit | Attending: Interventional Cardiology | Admitting: Interventional Cardiology

## 2013-07-16 ENCOUNTER — Encounter (HOSPITAL_COMMUNITY): Payer: BC Managed Care – PPO

## 2013-07-18 ENCOUNTER — Encounter (HOSPITAL_COMMUNITY)
Admission: RE | Admit: 2013-07-18 | Discharge: 2013-07-18 | Disposition: A | Discharge status: Home / Self Care | Payer: BC Managed Care – PPO | Source: Ambulatory Visit | Attending: Interventional Cardiology | Admitting: Interventional Cardiology

## 2013-07-18 ENCOUNTER — Encounter (HOSPITAL_COMMUNITY): Payer: BC Managed Care – PPO

## 2013-07-21 ENCOUNTER — Encounter (HOSPITAL_COMMUNITY): Admission: RE | Admit: 2013-07-21 | Payer: BC Managed Care – PPO | Source: Ambulatory Visit

## 2013-07-23 ENCOUNTER — Encounter (HOSPITAL_COMMUNITY)
Admission: RE | Admit: 2013-07-23 | Discharge: 2013-07-23 | Disposition: A | Discharge status: Home / Self Care | Payer: BC Managed Care – PPO | Source: Ambulatory Visit | Attending: Interventional Cardiology | Admitting: Interventional Cardiology

## 2013-07-23 DIAGNOSIS — Z5189 Encounter for other specified aftercare: Secondary | ICD-10-CM | POA: Insufficient documentation

## 2013-07-23 DIAGNOSIS — I5031 Acute diastolic (congestive) heart failure: Secondary | ICD-10-CM | POA: Insufficient documentation

## 2013-07-23 DIAGNOSIS — I509 Heart failure, unspecified: Secondary | ICD-10-CM | POA: Insufficient documentation

## 2013-07-23 DIAGNOSIS — I059 Rheumatic mitral valve disease, unspecified: Secondary | ICD-10-CM | POA: Insufficient documentation

## 2013-07-23 DIAGNOSIS — I4891 Unspecified atrial fibrillation: Secondary | ICD-10-CM | POA: Insufficient documentation

## 2013-07-24 ENCOUNTER — Encounter: Payer: Self-pay | Admitting: Thoracic Surgery (Cardiothoracic Vascular Surgery)

## 2013-07-24 ENCOUNTER — Ambulatory Visit (INDEPENDENT_AMBULATORY_CARE_PROVIDER_SITE_OTHER): Payer: BC Managed Care – PPO | Admitting: Thoracic Surgery (Cardiothoracic Vascular Surgery)

## 2013-07-24 VITALS — BP 129/91 | HR 81 | Resp 16 | Ht 67.0 in | Wt 162.0 lb

## 2013-07-24 DIAGNOSIS — Z9889 Other specified postprocedural states: Secondary | ICD-10-CM

## 2013-07-24 DIAGNOSIS — I898 Other specified noninfective disorders of lymphatic vessels and lymph nodes: Secondary | ICD-10-CM

## 2013-07-24 DIAGNOSIS — Z8679 Personal history of other diseases of the circulatory system: Secondary | ICD-10-CM

## 2013-07-24 MED ORDER — ASPIRIN EC 81 MG PO TBEC: 81.0000 mg | DELAYED_RELEASE_TABLET | Freq: Every day | ORAL | Status: DC

## 2013-07-24 NOTE — Progress Notes (Addendum)
      [address]       Ariel Simpson [address]             [phone]     CARDIOTHORACIC SURGERY OFFICE NOTE  Referring Provider is [name], MD PCP is Leanor Rubenstein, MD   HPI:  Patient returns for routine followup status post minimally invasive mitral valve repair and Maze procedure 03/18/2013. She was last seen in our office on 07/14/2013 which time she underwent aspiration of the small lymphocele in her right groin. She returns for office for followup today. She notes that after her lymphocele was aspirated the swelling in her right groin returned to approximately only a small fraction of its original size. She almost canceled her appointment.  There is no associated pain or tenderness. She otherwise is doing quite well.  She has been seen in followup by Dr. Katrinka Blazing who reportedly feels that it would be reasonable to stop Coumadin at this time.    Current Outpatient Prescriptions  Medication Sig Dispense Refill  . atorvastatin (LIPITOR) 20 MG tablet Take 20 mg by mouth daily.      . metoprolol succinate (TOPROL-XL) 25 MG 24 hr tablet Take 75 mg by mouth daily.      Marland Kitchen aspirin EC 81 MG tablet Take 1 tablet (81 mg total) by mouth daily.  100 tablet  1   No current facility-administered medications for this visit.      Physical Exam:   BP 129/91  Pulse 81  Resp 16  Ht 5\' 7"  (1.702 m)  Wt 162 lb (73.483 kg)  BMI 25.37 kg/m2  SpO2 98%  General:  Well-appearing  Chest:   Clear to auscultation  CV:   Regular rate and rhythm without murmur  Incisions:  Completely healed.  Right groin lymphocele is now nearly gone  Abdomen:  Soft nontender  Extremities:  Warm and well-perfused  Procedure Note:  Right groin lymphocele was aspirated under local anesthesia. A total of 15 mL of thin serous fluid is evacuated   Impression:  The patient is clinically doing very well. Lymphocele is now essentially completely resolved.  Plan:  I think it is reasonable  for the patient to stop taking Coumadin. I've recommended that she take a baby aspirin once a day.  She will return to see [place] for rhythm check next April   Clarence H. Cornelius Moras, MD 07/24/2013 10:32 AM

## 2013-07-24 NOTE — Patient Instructions (Signed)
Patient may resume normal physical activity without any particular limitations at this time, and return to our office only as needed should any further problems or questions arise.  

## 2013-07-25 ENCOUNTER — Encounter (HOSPITAL_COMMUNITY)
Admission: RE | Admit: 2013-07-25 | Discharge: 2013-07-25 | Disposition: A | Discharge status: Home / Self Care | Payer: BC Managed Care – PPO | Source: Ambulatory Visit | Attending: Interventional Cardiology | Admitting: Interventional Cardiology

## 2013-07-28 ENCOUNTER — Telehealth: Payer: Self-pay | Admitting: Neurology

## 2013-07-28 ENCOUNTER — Ambulatory Visit: Payer: BC Managed Care – PPO | Admitting: Thoracic Surgery (Cardiothoracic Vascular Surgery)

## 2013-07-28 ENCOUNTER — Encounter (HOSPITAL_COMMUNITY)
Admission: RE | Admit: 2013-07-28 | Discharge: 2013-07-28 | Disposition: A | Discharge status: Home / Self Care | Payer: BC Managed Care – PPO | Source: Ambulatory Visit | Attending: Interventional Cardiology | Admitting: Interventional Cardiology

## 2013-07-28 ENCOUNTER — Ambulatory Visit (INDEPENDENT_AMBULATORY_CARE_PROVIDER_SITE_OTHER): Payer: BC Managed Care – PPO | Admitting: Neurology

## 2013-07-28 ENCOUNTER — Encounter: Payer: Self-pay | Admitting: Neurology

## 2013-07-28 VITALS — BP 138/93 | HR 78 | Ht 67.0 in | Wt 167.0 lb

## 2013-07-28 DIAGNOSIS — I340 Nonrheumatic mitral (valve) insufficiency: Secondary | ICD-10-CM

## 2013-07-28 DIAGNOSIS — I059 Rheumatic mitral valve disease, unspecified: Secondary | ICD-10-CM

## 2013-07-28 DIAGNOSIS — H532 Diplopia: Secondary | ICD-10-CM

## 2013-07-28 NOTE — Telephone Encounter (Signed)
Patient says she needs blood work ordered to check her tegetroll level before she fuv apt on 9/30. She is making a trip into the lab on Wednesday for some other bloodwork if the order can be placed before then.

## 2013-07-28 NOTE — Progress Notes (Signed)
Pt will graduate from the Cardiac Rehab Phase II program with the completion of 36 exercise classes.  Pt plans to return to the [place] area for six weeks.  Pt lives part time in [place].  Pt plans to continue exercise with walking.  Medication list reconciled. Repeat PHQ2 score 0.

## 2013-07-28 NOTE — Progress Notes (Signed)
[facility]  PATIENT: Ariel Simpson DOB: January 17, 1956  HISTORICAL  Ariel Simpson is a 57 years old right-handed Caucasian female, referred by her primary care physician Dr. Wynelle Link , for evaluation of transient diplopia.  She had a past medical history of mitral valve repair April 29th 2013, she went into congestive heart failure prior to surgical procedure,  it was a complex valvuloplasty including triangular resection of posterior leaflet, artificial Gore-tex neocord placement and 30mm Sorin Memo 3D ring annuloplasty vis right mini thoracotomy.  She had one episode of atrial fibrillation, transient, lasting less than 10 minutes, she knew it by heart palpitation during her CHF before surgery.  She recovered very well from her open heart surgery, she also got a trifocal glasses in June 2014, she had 3 episodes of transient double vision since June  First one was June 12th, she noticed  transient double vision, blurry, her vision improved by closing one eye, symptoms last for minutes, she denied vertigo, no gait difficulty,  One night at dinner, she was sitting at the table, the same thing happened, lasted less than one minute.    July 23rd, she was walking out of the car, into the house, it happened again, transient binocular double vision, she denies motor or sensory deficit  She is taking Coumadin for mitral valve replacement, INR was 1.5 in May, now it is 1.7   Last 2 years, she had essential tremor in her hands, right more than left.  UPDATE 07/28/2013:  She has no recurrent event since August 15th 2014, all together she had 8 episodes, she complains of difficulty focusing using both eyes during episode, her vision is fine with monocular vision, transient dizziness, lasting less than one minute.   She is taking coumadin, her cardiolgist suggested  to d/c coumadin, keep her on ASA 81mg  only  REVIEW OF SYSTEMS: Full 14 system review of systems performed and notable only for  double vision blurry vision.  ALLERGIES: No Known Allergies  HOME MEDICATIONS: Outpatient Prescriptions Prior to Visit  Medication Sig Dispense Refill  . aspirin EC 81 MG tablet Take 1 tablet (81 mg total) by mouth daily.  100 tablet  1  . atorvastatin (LIPITOR) 20 MG tablet Take 20 mg by mouth daily.      . metoprolol succinate (TOPROL-XL) 25 MG 24 hr tablet Take 75 mg by mouth daily.       No facility-administered medications prior to visit.    PAST MEDICAL HISTORY: Past Medical History  Diagnosis Date  . Mitral valve prolapse   . High cholesterol   . Heart murmur   . Severe mitral regurgitation 03/03/2013    Acute on chronic with acute heart failure   . Paroxysmal supraventricular tachycardia   . Paroxysmal atrial fibrillation 03/11/2013  . Shortness of breath   . PONV (postoperative nausea and vomiting)     Dizziness and Nausea  . CHF (congestive heart failure)   . Hypertension   .        Marland Kitchen S/P Maze operation for atrial fibrillation 03/18/2013    Complete bilateral atrial lesion set using cryothermy and biopolar radiofrequency ablation with oversewing of LA appendage via right mini thoracotomy    PAST SURGICAL HISTORY: Past Surgical History  Procedure Laterality Date  . Cardiac catheterization    . Wisdom tooth extraction    . Vericose veins    . Abdominal hysterectomy      hernia repair  . Mitral valve repair Right 03/18/2013  Procedure: MINIMALLY INVASIVE MITRAL VALVE REPAIR (MVR);  Surgeon: Purcell Nails, MD;  Location: [facility] OR;  Service: Open Heart Surgery;  Laterality: Right;  . Intraoperative transesophageal echocardiogram N/A 03/18/2013    Procedure: INTRAOPERATIVE TRANSESOPHAGEAL ECHOCARDIOGRAM;  Surgeon: Purcell Nails, MD;  Location: [facility] OR;  Service: Open Heart Surgery;  Laterality: N/A;  . Maze N/A 03/18/2013    Procedure: MAZE;  Surgeon: Purcell Nails, MD;  Location: [facility] OR;  Service: Open Heart Surgery;  Laterality: N/A;    FAMILY HISTORY: Family  History  Problem Relation Age of Onset  . Cancer Mother   . Cancer Father     SOCIAL HISTORY:  History   Social History  . Marital Status: Married    Spouse Name: Peyton Najjar    Number of Children: 3  . Years of Education: BS   Occupational History  .     Social History Main Topics  . Smoking status: Never Smoker   . Smokeless tobacco: Never Used  . Alcohol Use: 0.6 oz/week    1 Glasses of wine per week     Comment: occasional  . Drug Use: No  . Sexually Active: Yes    Social History Narrative   Patient lives at home with her husband  Peyton Najjar). Patient has college education B.S. Patient is a  Production designer, theatre/television/film.   Right handed.   Caffeine- one cup daily.     PHYSICAL EXAM    Filed Vitals:   06/16/13 0822  BP: 136/92  Pulse: 96  Height: 5\' 7"  (1.702 m)  Weight: 164 lb (74.39 kg)    Body mass index is 26.15 kg/(m^2).   Generalized: In no acute distress  Neck: Supple, no carotid bruits   Cardiac: Regular rate rhythm  Pulmonary: Clear to auscultation bilaterally  Musculoskeletal: No deformity  Neurological examination  Mentation: Alert oriented to time, place, history taking, and causual conversation  Cranial nerve II-XII: Pupils were equal round reactive to light extraocular movements were full, visual field were full on confrontational test. facial sensation and strength were normal. hearing was intact to finger rubbing bilaterally. Uvula tongue midline.  head turning and shoulder shrug and were normal and symmetric.Tongue protrusion into cheek strength was normal.  Motor: normal tone, bulk and strength.  Sensory: Intact to fine touch, pinprick, preserved vibratory sensation, and proprioception at toes.  Coordination: Normal finger to nose, heel-to-shin bilaterally there was no truncal ataxia  Gait: Rising up from seated position without assistance, normal stance, without trunk ataxia, moderate stride, good arm swing, smooth turning, able to perform tiptoe, and heel  walking without difficulty.   Romberg signs: Negative  Deep tendon reflexes: Brachioradialis 2/2, biceps 2/2, triceps 2/2, patellar 2/2, Achilles 2/2, plantar responses were flexor bilaterally.   DIAGNOSTIC DATA (LABS, IMAGING, TESTING) - I reviewed patient records, labs, notes, testing and imaging myself where available.  Lab Results  Component Value Date   WBC 8.2 03/21/2013   HGB 8.4* 03/21/2013   HCT 24.2* 03/21/2013   MCV 89.6 03/21/2013   PLT 139* 03/21/2013      Component Value Date/Time   NA 134* 03/21/2013 0540   K 4.4 03/21/2013 0540   CL 98 03/21/2013 0540   CO2 27 03/21/2013 0540   GLUCOSE 101* 03/21/2013 0540   BUN 17 03/21/2013 0540   CREATININE 0.95 03/21/2013 0540   CALCIUM 8.8 03/21/2013 0540   PROT 7.0 03/12/2013 0404   ALBUMIN 3.9 03/12/2013 0404   AST 36 03/12/2013 0404   ALT 39* 03/12/2013  0404   ALKPHOS 96 03/12/2013 0404   BILITOT 0.7 03/12/2013 0404   GFRNONAA 64* 03/21/2013 0540   GFRAA 75* 03/21/2013 0540   No results found for this basename: CHOL,  HDL,  LDLCALC,  LDLDIRECT,  TRIG,  CHOLHDL   Lab Results  Component Value Date   HGBA1C 5.5 03/18/2013    Lab Results  Component Value Date   TSH 2.043 03/03/2013   ASSESSMENT AND PLAN   57 years old Caucasian female, with past medical history of mitral wall repair, now presenting with 8 episode of binocular double vision, only very mild small vessel disease on MRI, MRA showed no significant medium or large vessel disease.  1. It is ok to stop coumadin, and keep asa only as it is planned by her cardiologist Dr. Katrinka Blazing and Cardiothoracic surgeon Dr. Cornelius Moras. 2. Lab evaluation for other potential etiology of her double vision, this including acetylcholine receptor antibody, 3.  return to clinic in 6 months with Gerlene Fee, M.D. Ph.D.  [facility] [address], [address] [place], [place] [address] [phone]

## 2013-07-29 ENCOUNTER — Ambulatory Visit: Payer: Self-pay | Admitting: Rehabilitative and Restorative Service Providers"

## 2013-07-30 ENCOUNTER — Encounter (HOSPITAL_COMMUNITY)
Admission: RE | Admit: 2013-07-30 | Discharge: 2013-07-30 | Disposition: A | Discharge status: Home / Self Care | Payer: BC Managed Care – PPO | Source: Ambulatory Visit | Attending: Interventional Cardiology | Admitting: Interventional Cardiology

## 2013-07-31 ENCOUNTER — Ambulatory Visit: Payer: Self-pay

## 2013-07-31 DIAGNOSIS — M25373 Other instability, unspecified ankle: Secondary | ICD-10-CM

## 2013-07-31 NOTE — Progress Notes (Signed)
Patient has had a right foot injury recently and has had very little time to adapt to her new modified UCBL's. She has been able to wear them for about 3 hours max right now. Nothing requires adjustments right now. She will f/u in 3 weeks after she has had more time to wear her inserts.    Derrel Nip, CO, CPED

## 2013-08-01 ENCOUNTER — Telehealth: Payer: Self-pay | Admitting: Neurology

## 2013-08-01 ENCOUNTER — Encounter (HOSPITAL_COMMUNITY): Payer: BC Managed Care – PPO

## 2013-08-01 LAB — ACETYLCHOLINE RECEPTOR, MODULATING: Acetylcholine Rec Mod Ab: <12 % (ref 0–20)

## 2013-08-01 NOTE — Telephone Encounter (Signed)
I called and spoke with patient. I let her know her lab results were all normal. She was thankful.

## 2013-08-01 NOTE — Telephone Encounter (Signed)
Message copied by [facility] on Fri Aug 01, 2013  1:25 PM ------      Message from: Levert Feinstein      Created: Fri Aug 01, 2013 12:53 PM       Please call patient for normal laboratory result ------

## 2013-08-04 ENCOUNTER — Encounter (HOSPITAL_COMMUNITY): Payer: BC Managed Care – PPO

## 2013-08-05 ENCOUNTER — Ambulatory Visit: Payer: Self-pay | Admitting: Rehabilitative and Restorative Service Providers"

## 2013-08-05 DIAGNOSIS — S5290XA Unspecified fracture of unspecified forearm, initial encounter for closed fracture: Secondary | ICD-10-CM

## 2013-08-05 DIAGNOSIS — M84376A Stress fracture, unspecified foot, initial encounter for fracture: Secondary | ICD-10-CM

## 2013-08-05 NOTE — Progress Notes (Signed)
Orthopedic Sports/Spine  PT Note    [name]   [id number]     Diagnosis: B LE/UE weakness, B wrist fractures    Subjective:  Pain Score:  0  Pain: Unchanged, reports she continues to increase the time spent in the new orthotics      Objective:  ROM - Unchanged, Bilateral, Wrist, AROM  Strength - Ther Ex per flowsheet  Function: - Unchanged  Education:  Verbal cues for ther ex    Objective     see flowsheet    Treatment:  Ther Exercise per flowsheet    Assessment:  Patient responding favorably, continued inability to maintain SLS for greater than 2 sec.    Plan of Care:  Continue per Plan of care -  As written    Thank you for referring this patient to [facility]    Loney Hering, PT        Manual stretching wrist flex, ext, sup, pronation  5 min        Gait training  5 min    Agility ladder side steps         Hurdle step overs  5 min    Marching  20x   Balance training with perturbations         Rockerboard lateral weight shifts with airex     Leg Press  4.5p 3x10   HS curl    LAQ 1.5p 3x10        Rice bucket finger flex/ext     Rice bucket wrist sup/pro     Flexbar wrist flex/ext     Flexbar wrist pro/sup     Dumbbell wrist pro/sup  30x 5lbs        T-band    - B row    - ER        QS     SLR     Standing calf stretch with wedge     Calf raises

## 2013-08-06 ENCOUNTER — Encounter (HOSPITAL_COMMUNITY): Payer: BC Managed Care – PPO

## 2013-08-08 ENCOUNTER — Encounter (HOSPITAL_COMMUNITY): Payer: BC Managed Care – PPO

## 2013-08-11 ENCOUNTER — Ambulatory Visit
Admit: 2013-08-11 | Discharge: 2013-08-11 | Disposition: A | Discharge status: Home / Self Care | Payer: Self-pay | Source: Ambulatory Visit | Attending: Primary Care | Admitting: Primary Care

## 2013-08-11 ENCOUNTER — Telehealth: Payer: Self-pay | Admitting: Neurology

## 2013-08-11 ENCOUNTER — Encounter (HOSPITAL_COMMUNITY): Payer: BC Managed Care – PPO

## 2013-08-11 DIAGNOSIS — Z139 Encounter for screening, unspecified: Secondary | ICD-10-CM

## 2013-08-11 DIAGNOSIS — R569 Unspecified convulsions: Secondary | ICD-10-CM

## 2013-08-11 DIAGNOSIS — E039 Hypothyroidism, unspecified: Secondary | ICD-10-CM

## 2013-08-11 DIAGNOSIS — E559 Vitamin D deficiency, unspecified: Secondary | ICD-10-CM

## 2013-08-11 DIAGNOSIS — E785 Hyperlipidemia, unspecified: Secondary | ICD-10-CM

## 2013-08-11 LAB — CBC
Hematocrit: 41 % (ref 34–45)
Hemoglobin: 13.4 g/dL (ref 11.2–15.7)
MCV: 99 fL — ABNORMAL HIGH (ref 79–95)
Platelets: 279 10*3/uL (ref 160–370)
RBC: 4.2 MIL/uL (ref 3.9–5.2)
RDW: 14.2 % (ref 11.7–14.4)
WBC: 8.5 10*3/uL (ref 4.0–10.0)

## 2013-08-11 LAB — COMPREHENSIVE METABOLIC PANEL
ALT: 32 U/L (ref 0–35)
AST: 21 U/L (ref 0–35)
Albumin: 4.9 g/dL (ref 3.5–5.2)
Alk Phos: 77 U/L (ref 35–105)
Anion Gap: 10 (ref 7–16)
Bilirubin,Total: 0.3 mg/dL (ref 0.0–1.2)
CO2: 29 mmol/L — ABNORMAL HIGH (ref 20–28)
Calcium: 9.6 mg/dL (ref 8.6–10.2)
Chloride: 101 mmol/L (ref 96–108)
Creatinine: 0.64 mg/dL (ref 0.51–0.95)
GFR,Black: 112 *
GFR,Caucasian: 98 *
Glucose: 96 mg/dL (ref 60–99)
Lab: 18 mg/dL (ref 6–20)
Potassium: 3.9 mmol/L (ref 3.3–5.1)
Sodium: 140 mmol/L (ref 133–145)
Total Protein: 7.3 g/dL (ref 6.3–7.7)

## 2013-08-11 LAB — LIPID PANEL
Chol/HDL Ratio: 2.1
Cholesterol: 244 mg/dL — AB
HDL: 117 mg/dL
LDL Calculated: 110 mg/dL
Non HDL Cholesterol: 127 mg/dL
Triglycerides: 87 mg/dL

## 2013-08-11 LAB — TSH: TSH: 1.44 u[IU]/mL (ref 0.27–4.20)

## 2013-08-11 NOTE — Telephone Encounter (Signed)
Lab calling, patient called 2 weeks ago to have blood draw ordered and is now at lab.  There were no orders placed.  Please follow up with patient.

## 2013-08-11 NOTE — Telephone Encounter (Signed)
Please place lab order for carbamazepine level.  Patient can go to lab on Wednesday.  Thanks.

## 2013-08-11 NOTE — Telephone Encounter (Signed)
-----   Message from Renne Crigler, NP sent at 08/11/2013 1:11 PM ----- Please inform patient lab has been ordered and can be completed. Dewayne Hatch

## 2013-08-11 NOTE — Telephone Encounter (Signed)
I have ordered carbamazepine level to be drawn.  Yesenia Pflug Conception Oms, NP

## 2013-08-12 LAB — HEPATITIS C ANTIBODY: Hep C Ab: NEGATIVE

## 2013-08-13 ENCOUNTER — Ambulatory Visit
Admit: 2013-08-13 | Discharge: 2013-08-13 | Disposition: A | Discharge status: Home / Self Care | Payer: Self-pay | Source: Ambulatory Visit | Attending: Neurology | Admitting: Neurology

## 2013-08-13 ENCOUNTER — Encounter (HOSPITAL_COMMUNITY): Payer: BC Managed Care – PPO

## 2013-08-13 DIAGNOSIS — R569 Unspecified convulsions: Secondary | ICD-10-CM

## 2013-08-13 LAB — VITAMIN D
25-OH VIT D2: <4 ng/mL
25-OH VIT D3: 47 ng/mL
25-OH Vit Total: 47 ng/mL (ref 30–60)

## 2013-08-14 ENCOUNTER — Ambulatory Visit: Payer: Self-pay | Admitting: Rehabilitative and Restorative Service Providers"

## 2013-08-15 ENCOUNTER — Ambulatory Visit: Payer: Self-pay | Admitting: Primary Care

## 2013-08-15 ENCOUNTER — Encounter: Payer: Self-pay | Admitting: Primary Care

## 2013-08-15 ENCOUNTER — Encounter (HOSPITAL_COMMUNITY): Payer: BC Managed Care – PPO

## 2013-08-15 VITALS — BP 130/72 | HR 76 | Ht 63.0 in | Wt 121.6 lb

## 2013-08-15 DIAGNOSIS — Z23 Encounter for immunization: Secondary | ICD-10-CM

## 2013-08-15 LAB — CARBAMAZEPINE, FREE & TOTAL
Carbamazepine, Free: 24.1 % (ref 8.0–35.0)
Carbamazepine,Free: 2.8 ug/mL (ref 1.0–3.0)
Carbamazepine,Total: 11.6 ug/mL (ref 4.0–12.0)
Draw time, carbamazepine: 845

## 2013-08-15 LAB — HM HIV SCREENING OFFERED

## 2013-08-15 NOTE — Progress Notes (Signed)
Reason for Visit: Follow-up    Her son is living at home.  Her stomach is up and down, response to stress.    Hyperlipidemia-patient is tolerating the atorvastatin.  Denies myalgia.  We reviewed her lab results.  Total cholesterol 244, triglycerides 87, HDL 117, LDL 110.  Liver function tests are normal.    Hypothyroidism-denies unexplained changes in weight or energy level.  TSH is within normal limits.    Patient Active Problem List   Diagnosis Code   . Reported Trauma Neck    . Allergic Rhinitis 477.9   . Irritable bowel syndrome 564.1   . Hypertension 401.9   . Dysplastic Nevus 448.1   . Rosacea 695.3   . Osteoporosis 733.00   . Hypothyroidism 244.9   . Scoliosis 737.30   . Urge Incontinence Of Urine 788.31   . A-V Malformation Repair Supratentorial Complex    . Convulsive Disorder 780.39   . 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 Ringing In The Ears (Tinnitus) 388.30   . GERD (gastroesophageal reflux disease) 530.81   . Contusion of foot 924.20   . Pain in joint, ankle and foot 719.47     Current Outpatient Prescriptions   Medication   . levothyroxine (SYNTHROID, LEVOTHROID) 25 MCG tablet   . fluticasone (FLONASE) 50 MCG/ACT nasal spray   . TEGRETOL-XR 200 MG 12 hr tablet   . azelastine (ASTELIN) 137 MCG/SPRAY nasal spray   . albuterol-ipratropium (COMBIVENT RESPIMAT) 20-100 MCG/ACT inhaler   . Calcium Carb-Cholecalciferol 600-200 MG-UNIT TABS   . carBAMazepine (TEGRETOL XR) 100 MG 12 hr tablet   . amLODIPine (NORVASC) 5 MG tablet   . lamoTRIgine (LAMICTAL) 25 MG tablet   . darifenacin (ENABLEX) 15 MG 24 hr tablet   . atorvastatin (LIPITOR) 10 MG tablet   . GuaiFENesin (MUCINEX PO)   . Denosumab (PROLIA SC)   . loratadine (CLARITIN) 10 MG tablet   . Multiple Vitamin (MULTIVITAMIN) per tablet   . metronidazole (NORITATE) 1 % cream   . cholecalciferol (VITAMIN D) 1000 UNITS capsule   . Non-System Medication   . rabeprazole (ACIPHEX) 20 MG tablet   . NON-SYSTEM MEDICATION *A*   . ipratropium (ATROVENT  HFA) 17 MCG/ACT inhaler     No current facility-administered medications for this visit.       Medication list reviewed and updated, no changes were made today    Exam: BP 138/76  Pulse 76  Ht 1.6 m (5\' 3" )  Wt 55.157 kg (121 lb 9.6 oz)  BMI 21.55 kg/m2  Somewhat frail 57 year old lady in no acute distress.  Lungs are clear to auscultation bilaterally.  Cardiac exam reveals a normal S1, S2 with a regular rate and rhythm and no rubs murmurs or gallops.  Abdomen is soft and nontender.  Extremities are without edema.  Breast exam completed per patient request.  Fibercystic breast tissue noted, no appreciable mass or axillary adenopathy.    A/P:  1.  Hyperlipidemia-given history of stroke, will increase Lipitor to 20 mg for an LDL goal of 100 or below.  Continue to monitor liver function tests and CK enzyme.  2.  Hypothyroidism-well-controlled on current dose of levothyroxine, no changes were made today.  3.  Hypertension-blood pressure meets guideline goal on current regimen, no changes were made today.  Half dose flu shot administered today

## 2013-08-19 ENCOUNTER — Encounter: Payer: Self-pay | Admitting: Neurology

## 2013-08-19 ENCOUNTER — Ambulatory Visit: Payer: Self-pay | Admitting: Rehabilitative and Restorative Service Providers"

## 2013-08-19 ENCOUNTER — Ambulatory Visit: Payer: Self-pay | Admitting: Neurology

## 2013-08-19 VITALS — BP 156/74 | HR 79 | Ht 63.0 in | Wt 120.0 lb

## 2013-08-19 DIAGNOSIS — R569 Unspecified convulsions: Secondary | ICD-10-CM

## 2013-08-20 NOTE — Progress Notes (Signed)
She returns today in followup of her history of an AVM, status post resection, and residual seizure disorder.    She has had no recurrent seizures.  She has been tolerating her Tegretol reasonably well.  Her most recent lipid profile revealed an LDL of 110 and a total cholesterol of 244, reflecting a dose of atorvastatin 5 mg daily.  She is now increasing to 10 mg daily.  Her most recent carbamazepine level was 11.6.  She has not had any increase in her fatigue.    She continues to receive physical therapy and a training session weekly.  She is addressing general deconditioning.  He continues to work one day per week.  She states she has had some increased stress as her son is now returned home and is taking some courses at the [employer] [place].    A 16 point review of systems is notable for mild heartburn, occasional urinary incontinence, neck and back pain and pain in her feet.    Examination:  Filed Vitals:    08/19/13 0928   BP: 156/74   Pulse: 79   Height: 1.6 m (5\' 3" )   Weight: 54.432 kg (120 lb)     She is alert and oriented.  Her speech is clear.  Comprehension is intact.  She appeared upbeat today.  Cranial nerve examination reveals full versions without nystagmus.  Visual fields were full to confrontation.  She has full strength throughout.  She continues to have a bilateral suspension tremor which appears unchanged from our last visit.  I had her get him a sample of her drying today.  Her gait remains mildly antalgic and tentative.    Impression: History of seizures, currently stable on carbamazepine.  At our last visit we discussed switching to lamotrigine but due to a number of circumstances that never occurred.  For now she will continue on her carbamazepine until the issue of the Lipitor can be sorted out.  If and when she is switched to lamotrigine, I would plan on having her take both medications for a significant amount of time before ultimately stopping the carbamazepine.  I do not feel  strongly that she would need to stop driving during that transition, but rather use judgment in the event that she is having any side effects.  I plan seeing her again in 6 months.

## 2013-08-21 ENCOUNTER — Ambulatory Visit: Payer: Self-pay

## 2013-08-26 ENCOUNTER — Ambulatory Visit: Payer: Self-pay

## 2013-09-01 ENCOUNTER — Telehealth: Payer: Self-pay | Admitting: Interventional Cardiology

## 2013-09-01 DIAGNOSIS — I10 Essential (primary) hypertension: Secondary | ICD-10-CM

## 2013-09-01 MED ORDER — METOPROLOL SUCCINATE ER 25 MG PO TB24: 100.0000 mg | ORAL_TABLET | Freq: Every day | ORAL | Status: DC

## 2013-09-01 NOTE — Telephone Encounter (Signed)
New message   Concerned about bp---doubled betablocker Toprol to 100--no difference

## 2013-09-01 NOTE — Telephone Encounter (Signed)
pt sts that she has increased her toprol xl to 100mg  as discussed with Dr.Smith and her bp is still elevated averaging 145/100. she would like to know if she needs to change meds, decrease toprol because the higher dose is not effective, or schedule an appt.adv pt Dr.Smith is at the hospital today and I would f/u with him in the a.m.and call her in tomorrow with his instructions.

## 2013-09-02 ENCOUNTER — Ambulatory Visit: Payer: Self-pay | Admitting: Rehabilitative and Restorative Service Providers"

## 2013-09-02 MED ORDER — HYDROCHLOROTHIAZIDE 12.5 MG PO CAPS: 12.5000 mg | ORAL_CAPSULE | Freq: Every day | ORAL | Status: DC

## 2013-09-02 NOTE — Telephone Encounter (Signed)
pt adv to start hctz 12.5mg  daily.and to add potassium rich foods to her diet.pt rqst rx  [facility] [place]. pt adv labs bmet to be drwan 10/10/13. lab appt sche. pt verbalized understanding.

## 2013-09-02 NOTE — Telephone Encounter (Signed)
lmom pt home and cell to return call to give Dr.Smiths instructions

## 2013-09-02 NOTE — Telephone Encounter (Signed)
Have patient start Hctz 12.5 mg daily and suppliment diet with potassium rich foods(nanana's, raisins, citrus fruit, etc)

## 2013-09-04 ENCOUNTER — Ambulatory Visit: Payer: Self-pay

## 2013-09-05 ENCOUNTER — Ambulatory Visit: Payer: Self-pay

## 2013-09-09 ENCOUNTER — Ambulatory Visit: Payer: Self-pay | Admitting: Rehabilitative and Restorative Service Providers"

## 2013-09-09 ENCOUNTER — Ambulatory Visit: Payer: Self-pay

## 2013-09-09 DIAGNOSIS — M2140 Flat foot [pes planus] (acquired), unspecified foot: Secondary | ICD-10-CM

## 2013-09-09 DIAGNOSIS — S5290XA Unspecified fracture of unspecified forearm, initial encounter for closed fracture: Secondary | ICD-10-CM

## 2013-09-09 DIAGNOSIS — M25373 Other instability, unspecified ankle: Secondary | ICD-10-CM

## 2013-09-09 DIAGNOSIS — M84376A Stress fracture, unspecified foot, initial encounter for fracture: Secondary | ICD-10-CM

## 2013-09-09 NOTE — Progress Notes (Signed)
Orthopedic Sports/Spine  PT Note    [name]   [id number]     Diagnosis: B LE/UE weakness, B wrist fractures    Subjective:  Pain Score:  0  Pain: Unchanged, reports B UE soreness secondary to having to repetitively retie shoes.  Also, she reports that she is having difficulty with her orthotics.  She is being seen in O&P today for assessment.      Objective:  ROM - Unchanged, Bilateral, Wrist, AROM  Strength - Ther Ex per flowsheet  Function: - Unchanged  Education:  Verbal cues for ther ex    Objective     see flowsheet    Treatment:  Ther Exercise per flowsheet    Assessment:  Patient responding favorably    Plan of Care:  Continue per Plan of care -  As written    Thank you for referring this patient to [facility]    Loney Hering, PT        Manual stretching wrist flex, ext, sup, pronation  5 min        Gait training  5 min    Agility ladder side steps         Hurdle step overs  5 min    Marching     Balance training with perturbations         Rockerboard lateral weight shifts with airex     Leg Press  4p 3x10   HS curl    LAQ 1.5p 3x10        Rice bucket finger flex/ext     Rice bucket wrist sup/pro     Flexbar wrist flex/ext     Flexbar wrist pro/sup     Dumbbell wrist pro/sup  30x 5lbs        T-band    - B row    - ER        QS     SLR     Standing calf stretch with wedge     Calf raises

## 2013-09-11 ENCOUNTER — Ambulatory Visit: Payer: Self-pay

## 2013-09-11 NOTE — Progress Notes (Signed)
Patient seen for follow up to her bilateral modified UCBL delivery. She states that the arch area does not feel supported enough and that the support is not firm enough like her plastic UCBL's. This lack of firmness makes her feel unsupported and unbalanced. I will re-make her modified UCBL's out of carbon graphite with a thin top-cover with full length toe plates per patient's request. She will f/u for a 2 week fit/delivery. In the mean time, the existing modified UCBL's were adjusted by adding 1/4 inch additional arch cookies to them to provide increased support.    Derrel Nip, CO, CPED

## 2013-09-15 ENCOUNTER — Telehealth: Payer: Self-pay | Admitting: Interventional Cardiology

## 2013-09-15 ENCOUNTER — Other Ambulatory Visit: Payer: Self-pay

## 2013-09-15 MED ORDER — METOPROLOL SUCCINATE ER 100 MG PO TB24: 100.0000 mg | ORAL_TABLET | Freq: Every day | ORAL | Status: DC

## 2013-09-15 NOTE — Telephone Encounter (Signed)
Follow up      call for pt  after   1:30 # [phone]

## 2013-09-15 NOTE — Telephone Encounter (Signed)
New Problem  Pt states that she would like to consult w/ the nurse about BP Med's/// She needs a temporary renewal of meds and to discuss how they are ineffective.. Please call back to discuss.

## 2013-09-15 NOTE — Telephone Encounter (Signed)
returned pt call.pt sts that her diastolic bp has still been running 100 and over. pt wants to know if she can decrease metoprolol she feels it is not affective. adv pt I will speak with Dr.Smith tomorrow and call her back.pt rqst refill metoprolol. Done.pt verbalized understanding

## 2013-09-15 NOTE — Telephone Encounter (Signed)
returned pt call. lmom for pt to return call. 

## 2013-09-16 ENCOUNTER — Ambulatory Visit: Payer: Self-pay | Admitting: Rehabilitative and Restorative Service Providers"

## 2013-09-16 NOTE — Telephone Encounter (Signed)
pt has a nurse visit (bp check)scheduled for 09/17/13 @ 11 am pt to bring her bp machine.pt verbalized understanding.

## 2013-09-17 ENCOUNTER — Ambulatory Visit (INDEPENDENT_AMBULATORY_CARE_PROVIDER_SITE_OTHER): Payer: BC Managed Care – PPO | Admitting: *Deleted

## 2013-09-17 VITALS — BP 142/90 | HR 80 | Wt 167.1 lb

## 2013-09-17 DIAGNOSIS — I10 Essential (primary) hypertension: Secondary | ICD-10-CM

## 2013-09-17 MED ORDER — SPIRONOLACTONE-HCTZ 25-25 MG PO TABS: 1.0000 | ORAL_TABLET | Freq: Every day | ORAL | Status: DC

## 2013-09-17 NOTE — Progress Notes (Signed)
Came in for BP check.  BP in (R) 142/90; (L) 150/90 HR 80; w/ her cuff (R) 135/100; (L) 134/104.  She states at home diastolic BP has been consistently 100.  She feels good and is exercising every day. Spoke w/Dr. Katrinka Blazing and wants to stop HCTZ and add Aldactazide 25/25.  She will continue to monitor BP and will call with results.  She is scheduled for Bmet on 11/21. Will send Rx to [facility] at [place].

## 2013-09-25 ENCOUNTER — Telehealth: Payer: Self-pay | Admitting: Interventional Cardiology

## 2013-09-25 ENCOUNTER — Other Ambulatory Visit: Payer: Self-pay

## 2013-09-25 NOTE — Telephone Encounter (Signed)
New problem    Pt has a question about her Pre medication by the Dentist before her Cleaning.   Giver her a call back please.

## 2013-09-25 NOTE — Telephone Encounter (Signed)
Pt calling to make sure it was okay to take the Amoxicillin 2 gm 1 hour prior to dental cleaning procedure as prescribed by dentist next week. Reconfirmed with Kennon Rounds & guidelines okay.  Pt is aware. Mylo Red RN

## 2013-09-29 ENCOUNTER — Other Ambulatory Visit: Payer: Self-pay | Admitting: Primary Care

## 2013-09-30 ENCOUNTER — Ambulatory Visit: Payer: Self-pay | Admitting: Rehabilitative and Restorative Service Providers"

## 2013-10-05 ENCOUNTER — Other Ambulatory Visit: Payer: Self-pay | Admitting: Interventional Cardiology

## 2013-10-07 ENCOUNTER — Ambulatory Visit: Payer: Self-pay

## 2013-10-07 ENCOUNTER — Ambulatory Visit: Payer: Self-pay | Admitting: Rehabilitative and Restorative Service Providers"

## 2013-10-07 DIAGNOSIS — M25373 Other instability, unspecified ankle: Secondary | ICD-10-CM

## 2013-10-10 ENCOUNTER — Ambulatory Visit: Payer: BC Managed Care – PPO | Admitting: *Deleted

## 2013-10-10 DIAGNOSIS — I480 Paroxysmal atrial fibrillation: Secondary | ICD-10-CM

## 2013-10-10 DIAGNOSIS — Z8679 Personal history of other diseases of the circulatory system: Secondary | ICD-10-CM

## 2013-10-10 LAB — BASIC METABOLIC PANEL
BUN: 19 mg/dL (ref 6–23)
CO2: 28 mEq/L (ref 19–32)
Calcium: 9.7 mg/dL (ref 8.4–10.5)
Chloride: 100 mEq/L (ref 96–112)
Creat: 0.96 mg/dL (ref 0.50–1.10)
Glucose, Bld: 92 mg/dL (ref 70–99)

## 2013-10-13 ENCOUNTER — Telehealth: Payer: Self-pay

## 2013-10-13 DIAGNOSIS — I10 Essential (primary) hypertension: Secondary | ICD-10-CM

## 2013-10-13 NOTE — Telephone Encounter (Signed)
Message copied by Jarvis Newcomer on Mon Oct 13, 2013  8:24 AM ------      Message from: Verdis Prime      Created: Sun Oct 12, 2013  2:52 PM       Please update her diuretic regimen. I'm concerned about the amount of HCTZ. She is on. ------

## 2013-10-13 NOTE — Telephone Encounter (Signed)
called to give pt lab results and verify diuretic regimen.lmom for pt to return cal

## 2013-10-14 ENCOUNTER — Ambulatory Visit: Payer: Self-pay | Admitting: Rehabilitative and Restorative Service Providers"

## 2013-10-14 NOTE — Progress Notes (Signed)
SPORTS REHABILITATION PT LE STATUS      Subjective    [name] is a 57 y.o. female who is present today for continued treatment of her B UE/LE weakness and balance deficits.  Mechanism of injury/history of symptoms: No specific cause  Unable to transition to new orthotics secondary to continued foot pain.  Reports continued functional limitations secondary to balance and strength.    Attendance:  Good    Compliance:  Good   Relevant symptoms: Pain , Decreased ROM, Decreased strength, Instability, balance deficits    Symptom frequency:Intermittent  Symptom intensity (0 - 10 scale): Now 0 Best 0 Worst 2  Complains of L > R wrist tightness/soreness with ADL's and repetitive shoe tieing.       Patient's goals for therapy: Reduce pain, Increase ROM, Increase strength, Independent with home program, improve balance     Objective:    Observation: WNL   Palpation:  WNL     ROM/Strength:    bilateral knee, bilateral ankle          LE LEFT   RIGHT   Strength    PROM AROM PROM AROM Left Right   Knee ext     4/5 4/5   Knee flex     4/5 4/5   Ankle DF     4/5 4/5   Ankle PF     4/5 4/5            Wrist flex     4/5 4/5   Wrist ext     4/5 4/5   Wrist sup/pro     4/5 4/5     Continued inability to maintain SLS for greater than 5 sec on L or R LE's      Special Tests:   Hip: N/A     Knee: N/A     Ankle: N/A     Functional:  Perform self-care activities/basic ADLs - able to perform.  Walk more than 10 minutes - able to perform.  Ascend stairs with reciprocal gait - unable to perform.  Descend stairs with reciprocal gait - unable to perform.    Assessment  Findings consistent with 57 y.o., female with continueed ROM limitations, strength limitations, functional limitations, balance deficits    Prognosis:  Fair    Contraindications/Precautions/Limitation:  Per diagnosis  Long Term Goals:  Pain/Sx 0 - minimal, ROM/ flexibility WNL , Restoration of functional strength, Functional return to ADLs / activites without limitation         Treatment Plan:   Patient/family involved in developing goals and treatment plan: yes  Freq 1 times per week(s) for 2 month(s)     Exercise: AROM, AAROM, PROM, Progressive Resistive   Manual Techniques:  N/A   Modalities:  NA    Functional: Proprioception/Dynamic stability, Functional rehab       Thank you for referring this patient to [facility].    Loney Hering, PT        Manual stretching wrist flex, ext, sup, pronation  5 min        Gait training  5 min    Agility ladder side steps        Hurdle step overs     Marching     Balance training with perturbations        Rockerboard lateral weight shifts with airex     Leg Press  30lbs 3x10   HS curl 10lbs 10x   LAQ 10lbs  3x10        Rice bucket finger flex/ext     Rice bucket wrist sup/pro     Flexbar wrist flex/ext     Flexbar wrist pro/sup     Dumbbell wrist pro/sup  30x 5lbs        T-band    - B row    - ER        QS     SLR     Standing calf stretch with wedge     Calf raises

## 2013-10-16 ENCOUNTER — Other Ambulatory Visit: Payer: Self-pay | Admitting: Primary Care

## 2013-10-16 DIAGNOSIS — J309 Allergic rhinitis, unspecified: Secondary | ICD-10-CM

## 2013-10-20 ENCOUNTER — Other Ambulatory Visit: Payer: Self-pay | Admitting: Primary Care

## 2013-10-21 ENCOUNTER — Telehealth: Payer: Self-pay

## 2013-10-22 MED ORDER — SPIRONOLACTONE-HCTZ 25-25 MG PO TABS: 1.0000 | ORAL_TABLET | Freq: Every day | ORAL | Status: DC

## 2013-10-22 NOTE — Telephone Encounter (Signed)
Follow up    Returning Lisa's call to get lab results.  She has been out of town and is just now getting the message.

## 2013-10-22 NOTE — Telephone Encounter (Signed)
Pt given lab results.pt verbalized understanding.verified pt diuretic. Pt taking aldactazide 25/25.pt will keep upcoming appt 12/2013

## 2013-10-22 NOTE — Telephone Encounter (Signed)
Message copied by Jarvis Newcomer on Wed Oct 22, 2013 12:12 PM ------      Message from: Verdis Prime      Created: Sun Oct 12, 2013  2:52 PM       Please update her diuretic regimen. I'm concerned about the amount of HCTZ. She is on. ------

## 2013-10-24 ENCOUNTER — Ambulatory Visit: Payer: Self-pay

## 2013-10-24 DIAGNOSIS — M2140 Flat foot [pes planus] (acquired), unspecified foot: Secondary | ICD-10-CM

## 2013-10-24 DIAGNOSIS — M25373 Other instability, unspecified ankle: Secondary | ICD-10-CM

## 2013-10-28 ENCOUNTER — Ambulatory Visit: Payer: Self-pay | Admitting: Rehabilitative and Restorative Service Providers"

## 2013-10-31 ENCOUNTER — Ambulatory Visit: Payer: Self-pay

## 2013-10-31 ENCOUNTER — Ambulatory Visit: Payer: Self-pay | Admitting: Primary Care

## 2013-11-04 ENCOUNTER — Ambulatory Visit: Payer: Self-pay | Admitting: Rehabilitative and Restorative Service Providers"

## 2013-11-05 ENCOUNTER — Other Ambulatory Visit: Payer: Self-pay | Admitting: Neurology

## 2013-11-05 ENCOUNTER — Other Ambulatory Visit: Payer: Self-pay | Admitting: Primary Care

## 2013-11-05 DIAGNOSIS — E785 Hyperlipidemia, unspecified: Secondary | ICD-10-CM

## 2013-11-05 DIAGNOSIS — J309 Allergic rhinitis, unspecified: Secondary | ICD-10-CM

## 2013-11-05 DIAGNOSIS — E039 Hypothyroidism, unspecified: Secondary | ICD-10-CM

## 2013-11-05 DIAGNOSIS — N3941 Urge incontinence: Secondary | ICD-10-CM

## 2013-11-05 MED ORDER — FLUTICASONE PROPIONATE 50 MCG/ACT NA SUSP *I*
2.0000 | Freq: Every day | NASAL | Status: DC
Start: 2013-11-05 — End: 2019-06-16

## 2013-11-05 MED ORDER — LEVOTHYROXINE SODIUM 25 MCG PO TABS *I*
25.0000 ug | ORAL_TABLET | Freq: Every day | ORAL | Status: DC
Start: 2013-11-05 — End: 2013-11-14

## 2013-11-05 MED ORDER — ATORVASTATIN CALCIUM 10 MG PO TABS *I*
10.0000 mg | ORAL_TABLET | Freq: Every day | ORAL | Status: DC
Start: 2013-11-05 — End: 2013-11-14

## 2013-11-05 MED ORDER — IPRATROPIUM BROMIDE HFA 17 MCG/ACT IN AERS *I*
2.0000 | INHALATION_SPRAY | Freq: Four times a day (QID) | RESPIRATORY_TRACT | Status: DC
Start: 2013-11-05 — End: 2013-11-14

## 2013-11-05 MED ORDER — ATORVASTATIN CALCIUM 10 MG PO TABS *I*
10.0000 mg | ORAL_TABLET | Freq: Every day | ORAL | Status: DC
Start: 2013-11-05 — End: 2013-11-05

## 2013-11-05 MED ORDER — LEVOTHYROXINE SODIUM 25 MCG PO TABS *I*
25.0000 ug | ORAL_TABLET | Freq: Every day | ORAL | Status: DC
Start: 2013-11-05 — End: 2013-11-05

## 2013-11-05 MED ORDER — DARIFENACIN HYDROBROMIDE 15 MG PO TB24 *A*
15.0000 mg | ORAL_TABLET | Freq: Every day | ORAL | Status: DC
Start: 2013-11-05 — End: 2013-11-14

## 2013-11-05 MED ORDER — AZELASTINE HCL 137 MCG/SPRAY NA SOLN *I*
2.0000 | Freq: Four times a day (QID) | NASAL | Status: DC
Start: 2013-11-05 — End: 2013-11-10

## 2013-11-06 MED ORDER — TEGRETOL XR 200 MG PO TB12
ORAL_TABLET | ORAL | Status: DC
Start: 2013-11-05 — End: 2014-07-17

## 2013-11-06 MED ORDER — AMLODIPINE BESYLATE 5 MG PO TABS *I*
5.0000 mg | ORAL_TABLET | Freq: Every day | ORAL | Status: DC
Start: 2013-11-05 — End: 2013-11-14

## 2013-11-06 MED ORDER — CARBAMAZEPINE 100 MG PO TB12 *I*
100.0000 mg | ORAL_TABLET | Freq: Two times a day (BID) | ORAL | Status: DC
Start: 2013-11-05 — End: 2014-07-08

## 2013-11-10 ENCOUNTER — Other Ambulatory Visit: Payer: Self-pay | Admitting: Primary Care

## 2013-11-10 MED ORDER — AZELASTINE HCL 137 MCG/SPRAY NA SOLN *I*
2.0000 | Freq: Four times a day (QID) | NASAL | Status: DC
Start: 2013-11-10 — End: 2013-11-14

## 2013-11-11 ENCOUNTER — Ambulatory Visit: Payer: Self-pay | Admitting: Primary Care

## 2013-11-11 ENCOUNTER — Ambulatory Visit
Admit: 2013-11-11 | Discharge: 2013-11-11 | Disposition: A | Discharge status: Home / Self Care | Payer: Self-pay | Source: Ambulatory Visit | Attending: Primary Care | Admitting: Primary Care

## 2013-11-11 ENCOUNTER — Ambulatory Visit: Payer: Self-pay

## 2013-11-11 ENCOUNTER — Ambulatory Visit: Payer: Self-pay | Admitting: Rehabilitative and Restorative Service Providers"

## 2013-11-11 DIAGNOSIS — R569 Unspecified convulsions: Secondary | ICD-10-CM

## 2013-11-11 DIAGNOSIS — E559 Vitamin D deficiency, unspecified: Secondary | ICD-10-CM

## 2013-11-11 DIAGNOSIS — E785 Hyperlipidemia, unspecified: Secondary | ICD-10-CM

## 2013-11-11 DIAGNOSIS — I10 Essential (primary) hypertension: Secondary | ICD-10-CM

## 2013-11-11 DIAGNOSIS — E039 Hypothyroidism, unspecified: Secondary | ICD-10-CM

## 2013-11-11 LAB — CBC
Hematocrit: 39 % (ref 34–45)
Hemoglobin: 12.6 g/dL (ref 11.2–15.7)
MCH: 32 pg/cell (ref 26–32)
MCHC: 32 g/dL (ref 32–36)
MCV: 99 fL — ABNORMAL HIGH (ref 79–95)
Platelets: 272 10*3/uL (ref 160–370)
RBC: 4 MIL/uL (ref 3.9–5.2)
RDW: 13.8 % (ref 11.7–14.4)
WBC: 6.7 10*3/uL (ref 4.0–10.0)

## 2013-11-11 LAB — LIPID PANEL
Chol/HDL Ratio: 2
Cholesterol: 207 mg/dL — AB
HDL: 106 mg/dL
LDL Calculated: 84 mg/dL
Non HDL Cholesterol: 101 mg/dL
Triglycerides: 85 mg/dL

## 2013-11-11 LAB — COMPREHENSIVE METABOLIC PANEL
ALT: 22 U/L (ref 0–35)
AST: 20 U/L (ref 0–35)
Albumin: 4.5 g/dL (ref 3.5–5.2)
Alk Phos: 73 U/L (ref 35–105)
Anion Gap: 9 (ref 7–16)
Bilirubin,Total: 0.3 mg/dL (ref 0.0–1.2)
CO2: 30 mmol/L — ABNORMAL HIGH (ref 20–28)
Calcium: 8.6 mg/dL (ref 8.6–10.2)
Chloride: 102 mmol/L (ref 96–108)
Creatinine: 0.75 mg/dL (ref 0.51–0.95)
GFR,Black: 100 *
GFR,Caucasian: 87 *
Glucose: 90 mg/dL (ref 60–99)
Lab: 21 mg/dL — ABNORMAL HIGH (ref 6–20)
Potassium: 4.3 mmol/L (ref 3.3–5.1)
Sodium: 141 mmol/L (ref 133–145)
Total Protein: 7 g/dL (ref 6.3–7.7)

## 2013-11-11 LAB — MULTIPLE ORDERING DOCS

## 2013-11-11 LAB — TSH: TSH: 1.67 u[IU]/mL (ref 0.27–4.20)

## 2013-11-11 LAB — CK: CK: 74 U/L (ref 34–145)

## 2013-11-11 NOTE — Telephone Encounter (Signed)
[facility] calling again, provider sent script to [facility].  Confirmed script with pharmacist as noted above.

## 2013-11-12 ENCOUNTER — Other Ambulatory Visit: Payer: Self-pay | Admitting: Primary Care

## 2013-11-14 ENCOUNTER — Other Ambulatory Visit: Payer: Self-pay | Admitting: Primary Care

## 2013-11-14 DIAGNOSIS — J309 Allergic rhinitis, unspecified: Secondary | ICD-10-CM

## 2013-11-14 DIAGNOSIS — E785 Hyperlipidemia, unspecified: Secondary | ICD-10-CM

## 2013-11-14 DIAGNOSIS — E039 Hypothyroidism, unspecified: Secondary | ICD-10-CM

## 2013-11-14 LAB — VITAMIN D
25-OH VIT D2: <4 ng/mL
25-OH VIT D3: 58 ng/mL
25-OH Vit Total: 58 ng/mL (ref 30–60)

## 2013-11-14 MED ORDER — AZELASTINE HCL 137 MCG/SPRAY NA SOLN *I*
2.0000 | Freq: Four times a day (QID) | NASAL | Status: DC
Start: 2013-11-14 — End: 2015-04-08

## 2013-11-14 MED ORDER — ATORVASTATIN CALCIUM 10 MG PO TABS *I*
10.0000 mg | ORAL_TABLET | Freq: Every day | ORAL | Status: DC
Start: 2013-11-14 — End: 2015-02-03

## 2013-11-14 MED ORDER — IPRATROPIUM BROMIDE HFA 17 MCG/ACT IN AERS *I*
2.0000 | INHALATION_SPRAY | Freq: Four times a day (QID) | RESPIRATORY_TRACT | Status: DC
Start: 2013-11-14 — End: 2016-08-08

## 2013-11-14 MED ORDER — LEVOTHYROXINE SODIUM 25 MCG PO TABS *I*
25.0000 ug | ORAL_TABLET | Freq: Every day | ORAL | Status: DC
Start: 2013-11-14 — End: 2014-10-23

## 2013-11-14 MED ORDER — AMLODIPINE BESYLATE 5 MG PO TABS *I*
5.0000 mg | ORAL_TABLET | Freq: Every day | ORAL | Status: DC
Start: 2013-11-14 — End: 2014-08-04

## 2013-11-14 MED ORDER — DARIFENACIN HYDROBROMIDE 15 MG PO TB24 *A*
15.0000 mg | ORAL_TABLET | Freq: Every day | ORAL | Status: DC
Start: 2013-11-14 — End: 2015-11-01

## 2013-11-15 LAB — CARBAMAZEPINE, FREE & TOTAL
Carbamazepine, Free: 23.4 % (ref 8.0–35.0)
Carbamazepine,Free: 2.5 ug/mL (ref 1.0–3.0)
Carbamazepine,Total: 10.7 ug/mL (ref 4.0–12.0)
Draw time, carbamazepine: 850

## 2013-11-19 ENCOUNTER — Ambulatory Visit: Payer: Self-pay

## 2013-11-19 DIAGNOSIS — M25373 Other instability, unspecified ankle: Secondary | ICD-10-CM

## 2013-11-19 DIAGNOSIS — M2140 Flat foot [pes planus] (acquired), unspecified foot: Secondary | ICD-10-CM

## 2013-11-21 NOTE — Progress Notes (Signed)
FOOT ORTHOSIS                                                                           FIT AND DELIVERY    SYARA ARMY                                                                                                                  11/21/2013  [id number]                                                                                              Deirdre Priest                PURPOSE: The patient presents to today's appointment for delivery of: Bilateral foot orthoses, deep heel cup, modified UCBL type, fabricated out of thin pre-peg carbon graphite.    SUBJECTIVE:  Patient has diagnosis of M/L ankle and hindfoot instablility an pes planus.     OBJECTIVE:Contours appropriately match patient's foot. Foot orthoses were trimmed to fit inside shoes worn today. This is a re-make, as past attempts were either too soft or too rigid of materials.    Overall length of foot orthoses were trimmed AV:WUJW.      The patient was provided with:Custom foot orthoses.    Modifications performed:   n/a    Patient was able to ambulate comfortably in exam room/hallway after the following modifications: no change    ASSESSMENT:  Foot orthoses are appropriate at this time base upon patient feedback and visual assessment of fit and function. and The patient is to benefit from bilateral foot orthoses that  Provide medial longitudinal arch support. and Increase hindfoot control.    X Device was inspected for safety and security and found to be functioning properly    The patient given verbal and written instructions.  The following Instructions were reviewed:   XPurpose of device   XCleaning / care of device   XPotential Risks / Benefits   XFrequency / Duration of use   XHow to report potential failure / malfunctions    PLAN:   Follow up in 2 week(s)    Derrel Nip  11/21/2013

## 2013-11-25 ENCOUNTER — Other Ambulatory Visit: Payer: Self-pay

## 2013-11-25 MED ORDER — METOPROLOL SUCCINATE ER 100 MG PO TB24: ORAL_TABLET | ORAL | Status: DC

## 2013-11-25 NOTE — Progress Notes (Signed)
Patient seen as a follow-up from new bilateral carbon UCBL delivery (re-make) to replace modified UCBL's. She has been unsuccessful in wearing them because she finds them to be too rigid. I will re-make these again with the thinnest lay-up of carbon pre-peg. She will f/u in 2 weeks for fit/delivery.    Derrel Nip, [place], CPED

## 2013-11-28 ENCOUNTER — Encounter: Payer: Self-pay | Admitting: Primary Care

## 2013-11-28 ENCOUNTER — Ambulatory Visit: Payer: Self-pay | Admitting: Primary Care

## 2013-11-28 VITALS — BP 108/62 | HR 64 | Ht 63.0 in | Wt 119.2 lb

## 2013-11-28 DIAGNOSIS — G40909 Epilepsy, unspecified, not intractable, without status epilepticus: Secondary | ICD-10-CM

## 2013-11-28 DIAGNOSIS — E039 Hypothyroidism, unspecified: Secondary | ICD-10-CM

## 2013-11-28 DIAGNOSIS — E559 Vitamin D deficiency, unspecified: Secondary | ICD-10-CM

## 2013-11-28 DIAGNOSIS — E785 Hyperlipidemia, unspecified: Secondary | ICD-10-CM

## 2013-11-30 NOTE — Progress Notes (Signed)
Reason for Visit: Results and Follow-up    1.  Hyperlipidemia-patient is tolerating low-dose atorvastatin.  We reviewed her cholesterol panel.  Total cholesterol 207, triglycerides 85, HDL 106 LDL 84.  She denies myalgia.  CK enzyme normal.  Liver function tests normal.    Hypertension-blood pressure is a bit on the low side today.  She denies dizziness with orthostatic changes, but has noted some degree of fatigue which she wonders if this might be related to the low blood pressure.    History of seizure in the setting of intracranial hemorrhage-patient is planning to change from carbamazepine to Lamictal as was recommended by her neurologist.  He has already given her directions as to how to titrate the dose.  His plan is to overlap the 2 medications for some time prior to weaning her off the carbamazepine.  The hope is that getting her off the carbamazepine may help with some of her reduced energy.  She complains that she and her husband are not able to watch a movie as she typically falls asleep.    Hypothyroidism-reviewed labs, TSH is within normal limits on current dose of levothyroxine.  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    Ringing In The Ears (Tinnitus) 388.30    GERD (gastroesophageal reflux disease) 530.81    Contusion of foot 924.20    Pain in joint, ankle and foot 719.47     Current Outpatient Prescriptions   Medication     amLODIPine (NORVASC) 5 MG tablet    atorvastatin (LIPITOR) 10 MG tablet    ipratropium (ATROVENT HFA) 17 MCG/ACT inhaler    levothyroxine (SYNTHROID, LEVOTHROID) 25 MCG tablet    darifenacin (ENABLEX) 15 MG 24 hr tablet    azelastine (ASTELIN) 137 MCG/SPRAY nasal spray    TEGRETOL-XR 200 MG 12 hr tablet    carBAMazepine (TEGRETOL XR) 100 MG 12 hr tablet    fluticasone (FLONASE) 50 MCG/ACT nasal spray    Calcium Carb-Cholecalciferol 600-200 MG-UNIT TABS    lamoTRIgine (LAMICTAL) 25 MG tablet    GuaiFENesin (MUCINEX PO)    Denosumab (PROLIA SC)    loratadine (CLARITIN) 10 MG tablet    Multiple Vitamin (MULTIVITAMIN) per tablet    metronidazole (NORITATE) 1 % cream    cholecalciferol (VITAMIN D) 1000 UNITS capsule    Non-System Medication    rabeprazole (ACIPHEX) 20 MG tablet    NON-SYSTEM MEDICATION *A*     No current facility-administered medications for this visit.       Medication list reviewed and updated, no changes were made today    Exam: BP 108/62   Pulse 64   Ht 1.6 m (5\' 3" )   Wt 54.069 kg (119 lb 3.2 oz)   BMI 21.12 kg/m2  Somewhat frail 58 year old lady in no acute distress.  Tympanic membranes are clear bilaterally.  Nasal mucosa is  boggy but without erythema or exudate.  Sinuses transilluminate normally.  Oral mucosa is moist without exudate.  Neck is supple without lymphadenopathy.  No supraclavicular adenopathy.  Lungs are clear to auscultation bilaterally.  No wheezes rales or rhonchi.  Per patient request, breast exam was completed.  Patient has fibrocystic breast tissue throughout.  However, no mass or axillary adenopathy is appreciated.    A/P:  1.  Hyperlipidemia-we are both pleased to see that her LDL is now at goal under 100 low dose atorvastatin.  Continue current regimen.  2.  Hypertension-blood pressure is a bit on the low side today.  I've asked her to have her blood pressure checked outside of here to be sure that it is not chronically low and if so we will reduce her  amlodipine  3.  Seizure disorder-initiate Lamictal titration  as per Neurology recommendations   4.  Hypothyroidism-TSH is within normal limits on current regimen, no changes were made today.  Plan to see patient back in approximately 3 months with labs prior

## 2013-12-02 ENCOUNTER — Encounter: Payer: Self-pay | Admitting: Gastroenterology

## 2013-12-02 LAB — HM DEXA SCAN

## 2013-12-03 NOTE — Progress Notes (Signed)
FOOT ORTHOSIS                                                                           FIT AND DELIVERY    SHAUNELL SANGIOVANNI                                                                                                                  12/03/2013  [id number]                                                                                              Deirdre Priest                PURPOSE: The patient presents to today's appointment for delivery of: Bilateral foot orthoses, modified UCBL's    SUBJECTIVE: patient has diagnosis of pes planus and ankle M/L instability.     OBJECTIVE:Contours appropriately match patient's foot. Foot orthoses were trimmed to fit inside shoes worn today.    Overall length of foot orthoses were trimmed JW:JXBJ.      The patient was provided with:Custom foot orthoses.    Modifications performed:   n/a    Patient was able to ambulate comfortably in exam room/hallway after the following modifications: no change    ASSESSMENT:  Foot orthoses are appropriate at this time base upon patient feedback and visual assessment of fit and function. and The patient is to benefit from bilateral foot orthoses that  Provide medial longitudinal arch support. and Increase hindfoot control.  X Device was inspected for safety and security and found to be functioning properly    The patient given verbal and written instructions.  The following Instructions were reviewed:   XPurpose of device   XCleaning / care of device   XPotential Risks / Benefits   XFrequency / Duration of use   XHow to report potential failure / malfunctions        PLAN:   Follow up in prn.    Derrel Nip  12/03/2013

## 2013-12-03 NOTE — Progress Notes (Signed)
FOOT ORTHOSIS                                                                           FIT AND DELIVERY    WITTEN REGISTER                                                                                                                  10/07/13  [id number]                                                                                              [name]                PURPOSE: The patient presents to today's appointment for delivery of: Bilateral foot orthoses, modified UCBL's made with carbon graphite reinforcement, with full length foot plates per patient's request.    SUBJECTIVE: Patient has diagnosis of pes planus and M/L instability.     OBJECTIVE:Contours appropriately match patient's foot. Foot orthoses were trimmed to fit inside shoes worn today.    Overall length of foot orthoses were trimmed AV:WUJW.      The patient was provided with:Custom foot orthoses.    Modifications performed:   n/a    Patient was able to ambulate comfortably in exam room/hallway after the following modifications: no change    ASSESSMENT:  Foot orthoses are appropriate at this time base upon patient feedback and visual assessment of fit and function. and The patient is to benefit from bilateral foot orthoses that  Provide medial longitudinal arch support. and Increase hindfoot control. The increased hindfoot control will help stabilize ankle M/L stablility.  X Device was inspected for safety and security and found to be functioning properly    The patient given verbal and written instructions.  The following Instructions were reviewed:   XPurpose of device   XCleaning / care of device   XPotential Risks / Benefits   XFrequency / Duration of use   XHow to report potential failure / malfunctions    PLAN:   Follow up in 3 week(s).    Derrel Nip  10/07/13

## 2013-12-09 ENCOUNTER — Ambulatory Visit: Payer: Self-pay

## 2013-12-09 DIAGNOSIS — M84376A Stress fracture, unspecified foot, initial encounter for fracture: Secondary | ICD-10-CM

## 2013-12-09 DIAGNOSIS — M2140 Flat foot [pes planus] (acquired), unspecified foot: Secondary | ICD-10-CM

## 2013-12-09 DIAGNOSIS — S5290XA Unspecified fracture of unspecified forearm, initial encounter for closed fracture: Secondary | ICD-10-CM

## 2013-12-09 DIAGNOSIS — M25373 Other instability, unspecified ankle: Secondary | ICD-10-CM

## 2013-12-09 NOTE — Progress Notes (Signed)
SPORTS REHABILITATION PT LE STATUS      Subjective    [name] is a 58 y.o. female who is present today for continued treatment of her B UE/LE weakness and balance deficits.She reports she has had a variety of things that have kept her from coming to therapy. No complaints of pain today. Denies recent falls. She is still working with othotist to address foot orthotics, plans to see Dr. Criselda Peaches.   Mechanism of injury/history of symptoms: No specific cause    Attendance:  Poor, last seen October 14, 2013.   Compliance:  Good , She states she has a trainer who she exercises with 1x/week. She does other exercises as much as she can.   Relevant symptoms: Decreased ROM, Decreased strength, Instability, balance deficits    Symptom frequency:Intermittent  Symptom intensity (0 - 10 scale): Now 0 Best 0 Worst 2  Complains of L > R wrist tightness/soreness with ADL's and repetitive shoe tieing.       Patients goals for therapy: Reduce pain, Increase ROM, Increase strength, Independent with home program, improve balance     Objective:      ROM/Strength:    bilateral knee, bilateral ankle          LE LEFT   RIGHT   Strength    PROM AROM PROM AROM Left Right   Knee ext     4/5 4/5   Knee flex     4/5 4/5   Ankle DF     4/5 4/5   Ankle PF     4/5 4/5            Wrist flex     4/5 4/5   Wrist ext     4/5 4/5   Wrist sup/pro     4/5 4/5     Continued inability to maintain SLS for greater than 5 sec on L or R LE's      Special Tests:   Hip: N/A     Knee: N/A     Ankle: N/A     Functional:  Perform self-care activities/basic ADLs - able to perform.  Walk more than 10 minutes - unable to perform.  Ascend stairs with reciprocal gait - unable to perform.  Descend stairs with reciprocal gait - unable to perform.    Assessment  Findings consistent with 58 y.o., female with continueed ROM limitations, strength limitations, functional limitations, balance deficits    Prognosis:  Fair    Contraindications/Precautions/Limitation:   Per diagnosis  Long Term Goals:  Pain/Sx 0 - minimal, ROM/ flexibility WNL , Restoration of functional strength, Functional return to ADLs / activites without limitation        Treatment Plan:   Patient/family involved in developing goals and treatment plan: yes  Freq 1 times per week(s) for 2 month(s)     Exercise: AROM, AAROM, PROM, Progressive Resistive   Manual Techniques:  N/A   Modalities:  NA    Functional: Proprioception/Dynamic stability, Functional rehab       Thank you for referring this patient to [facility].    Arlis Porta, PT        Manual stretching wrist flex, ext, sup, pronation  5 min    Recumbent bike 2 min  on L2   Gait training  5 min    Agility ladder side steps         Hurdle step overs     Marching  Balance training with perturbations         Rockerboard lateral weight shifts with airex     Leg Press  30lbs 3x10   HS curl 5lbs 5x  *unable to perform 10   LAQ 10lbs 3x10        Rice bucket finger flex/ext     Rice bucket wrist sup/pro     Flexbar wrist flex/ext  Green 30x   Flexbar wrist pro/sup  Green 30x   Dumbbell wrist pro/sup  30x 5lbs        T-band    - B row    - ER    QS     SLR     Standing calf stretch with wedge     Calf raises  10x

## 2013-12-09 NOTE — Progress Notes (Signed)
Patient seen today for a follow-up on her modified UCBL delivery.     She states that she has been unable to progress wearing these more then 3 hours a day with out getting foot and back pain. There is not any particular thing that can be adjusted to make her more comfortable with them.     She would like to move forward with new UCBL's, even though her existing ones fit well and are comfortable to her. Her biggest complaint is that they are bulky in the shoe and that she has to re-tie her shoes several times throughout the day because they loosen. Her biggest fear is that something will happen to her "standby" UCBL's that she wears daily and she will not be able to find anything to replace them.     I have told her that i feel the best bet would be to copy-cast her existing UCBL's, so that she has an exact replica of them.     I also think it would be a good idea to see Dr Criselda Peaches for some direction/ input on her best course of treatment. She continues to go to physical therapy but without her having any motion in her feet I'm afraid she'll never move forward with her goals of better balance and enhanced proprioception.     She will schedule and appoinment to see Dr Criselda Peaches. She wants to see me again in 2 weeks to potentially move forward with copy-casting her inserts.    Kimber Relic, [place], [place]

## 2013-12-23 ENCOUNTER — Ambulatory Visit: Payer: Self-pay

## 2013-12-23 DIAGNOSIS — M25373 Other instability, unspecified ankle: Secondary | ICD-10-CM

## 2013-12-23 DIAGNOSIS — M84376A Stress fracture, unspecified foot, initial encounter for fracture: Secondary | ICD-10-CM

## 2013-12-23 DIAGNOSIS — M25579 Pain in unspecified ankle and joints of unspecified foot: Secondary | ICD-10-CM

## 2013-12-23 DIAGNOSIS — S5290XA Unspecified fracture of unspecified forearm, initial encounter for closed fracture: Secondary | ICD-10-CM

## 2013-12-23 NOTE — Progress Notes (Signed)
Orthopedic Sports/Spine  PT Note    [name]   [id number]     Diagnosis: B LE/UE weakness, B wrist fractures    Subjective:  Pain Score:  0  Pain: Unchanged, reports right>left foot pain today.   She was seen in O&P today for assessment. She reports feeling fine after last session. However, she has concerns about doing 'too much' in therapy.       Objective:  ROM - Unchanged, Bilateral, Wrist, AROM  Strength - Ther Ex per flowsheet  Function: - Unchanged  Education:  Verbal cues for ther ex    Objective        Manual stretching wrist flex, ext, sup, pronation  5 min    Recumbent bike L2   2.5 min   Gait training     Agility ladder side steps     Heel toe walking  Heel toe walking looking up/down 30 ft x2  30 ft x 2   Hurdle step overs     Marching     Balance training with perturbations         Rockerboard lateral weight shifts with airex     Leg Press     HS curl    LAQ  x10    Balance 1 foot 30''x2 each leg  *foot pain   Rice bucket finger flex/ext     Rice bucket wrist sup/pro     Flexbar wrist flex/ext  Green 30x   Flexbar wrist pro/sup  Green 30x   Dumbbell wrist pro/sup             QS     SLR         Calf raises  10x3           Treatment:  Ther Exercise per flowsheet    Assessment:  Patient responding favorably, fatigued by session today.     Plan of Care:  Continue per Plan of care -  As written , continue to progress LE strengthening and general endurance as tolerated.     Thank you for referring this patient to [place], [place]

## 2013-12-23 NOTE — Progress Notes (Signed)
Patient seen for evaluation to copy cast her existing UCBL's. We have made her several types of foot orthoses to try and replace her existing UCBL's, however she has been unable to use any of these successfully. Ultimately she would like second pair of UCBL's, but wants them to be exactly like her current ones. We have decided to use her existing ones to copy cast them to make a second set which will mimic her existing ones as close as possible. She will f/u in 2 weeks for fit/delivery.    Kimber Relic, [place], [place]

## 2013-12-24 ENCOUNTER — Other Ambulatory Visit: Payer: Self-pay | Admitting: Dermatology

## 2013-12-29 ENCOUNTER — Ambulatory Visit: Payer: Self-pay

## 2013-12-29 DIAGNOSIS — M84376A Stress fracture, unspecified foot, initial encounter for fracture: Secondary | ICD-10-CM

## 2013-12-29 NOTE — Progress Notes (Signed)
Orthopedic Sports/Spine  PT Note    [name]   [id number]     Diagnosis: B LE/UE weakness, B wrist fractures    Subjective:  Pain Score:  0, no pain today.   Pain: Unchanged, no problems with exercises at home. Her trainer is helping her with the exercises. She plans to add foot and ankle exercises done in clinic to her home program.     Objective:  ROM - Unchanged, Bilateral, Wrist, AROM  Strength - Ther Ex per flowsheet, added ankle AROM and toe scrunches.   Function: - Unchanged  Education:  Verbal cues for ther ex    Objective        Recumbent bike    Gait training     Agility ladder side steps  5  min   Heel toe walking  Heel toe walking looking up/down 30 ft x2   Hurdle step overs     Marching     Balance training with perturbations         Rockerboard lateral weight shifts with airex     Leg Press     HS curl 3x10   LAQ 3 x10    Balance 1 foot    Calf raise 30x   Rice bucket wrist sup/pro     Flexbar wrist flex/ext  Green 30x   Flexbar wrist pro/sup  Green 30x   Ankle ABC 1x   Towel scrunch 2 min       QS     SLR             Treatment:  Ther Exercise per flowsheet    Assessment:  Patient responding favorably, tolerated exercises, but fatigued by session today.     Plan of Care:  Continue per Plan of care -  As written , continue to progress LE strengthening and general endurance as tolerated.     Thank you for referring this patient to [place], [place]

## 2013-12-30 ENCOUNTER — Ambulatory Visit: Payer: Self-pay

## 2014-01-06 ENCOUNTER — Ambulatory Visit: Payer: Self-pay

## 2014-01-06 DIAGNOSIS — M84376A Stress fracture, unspecified foot, initial encounter for fracture: Secondary | ICD-10-CM

## 2014-01-06 NOTE — Progress Notes (Signed)
Orthopedic Sports/Spine  PT Note    [name]   [id number]     Diagnosis: B LE/UE weakness, B wrist fractures    Subjective:  Pain Score:  0, no pain right now.   Pain: Unchanged, she states she brought the exercises to her trainer, they dovetail well.  She reports she had muscle soreness after lase session, but it was okay. Reported ankle fatigue on bike and with ABC's.       Objective:  ROM - NA, Bilateral, Wrist  Strength - Ther Ex per flowsheet  Function: - Unchanged  Education:  Verbal cues for ther ex    Objective      Recumbent bike 2.5 min   Agility ladder side steps   5 min    Heel toe walking  Heel toe walking looking up/down    Hurdle step overs  10 feet x 8   Marching     Balance training with perturbations         Rockerboard lateral weight shifts with airex     Leg Press     HS curl 3x10   LAQ 3x10    Balance 1 foot    Calf raise 30x   Rice bucket wrist sup/pro     Flexbar wrist flex/ext     Flexbar wrist pro/sup     Ankle ABC 2-3x   Towel scrunch 2 min   Clams    QS     SLR             Treatment:  Neuromuscular Re-ed,  Activity dynamic balalnce activities per flowsheet, Ther Exercise per flowsheet    Assessment:  Patient responding favorably, tolerated exercises, but fatigued by session today.   Significant foot atrophy noted.     Plan of Care:  Continue per Plan of care -  As written , continue to progress balance, LE strengthening and general endurance as tolerated.     Thank you for referring this patient to [place], [place]

## 2014-01-07 ENCOUNTER — Ambulatory Visit: Payer: Self-pay

## 2014-01-07 DIAGNOSIS — M2140 Flat foot [pes planus] (acquired), unspecified foot: Secondary | ICD-10-CM

## 2014-01-07 DIAGNOSIS — M25373 Other instability, unspecified ankle: Secondary | ICD-10-CM

## 2014-01-07 NOTE — Progress Notes (Signed)
FOOT ORTHOSIS                                                                           FIT AND DELIVERY    [name]                                                                                                                  01/07/2014  [id number]                                                                                              [place], [place]                PURPOSE: The patient presents to today's appointment for delivery of: Bilateral foot orthoses, UCBL's, copy-casted from her originals, remake    SUBJECTIVE: patient has diagnosis of pes planus and ankle M/L instability, bilaterally .    OBJECTIVE:Contours appropriately match patient's foot. Foot orthoses were trimmed to fit inside shoes worn today.    Overall length of foot orthoses were trimmed XB:JYNW.      The patient was provided with:Custom foot orthoses.    Modifications performed:   n/a    Patient was able to ambulate comfortably in exam room/hallway after the following modifications: no change    ASSESSMENT:  Foot orthoses are appropriate at this time base upon patient feedback and visual assessment of fit and function. and The patient is to benefit from bilateral foot orthoses that  Provide medial longitudinal arch support. and Increase hindfoot control.     X Device was inspected for safety and security and found to be functioning properly    The patient given verbal and written instructions.  The following Instructions were reviewed:   XPurpose of device   XCleaning / care of device   XPotential Risks / Benefits   XFrequency / Duration of use   XHow to report potential failure / malfunctions    PLAN:   Follow up in 2 week(s).    Kimber Relic  01/07/2014

## 2014-01-09 ENCOUNTER — Telehealth: Payer: Self-pay | Admitting: Interventional Cardiology

## 2014-01-09 ENCOUNTER — Telehealth: Payer: Self-pay | Admitting: *Deleted

## 2014-01-09 ENCOUNTER — Other Ambulatory Visit: Payer: Self-pay | Admitting: *Deleted

## 2014-01-09 DIAGNOSIS — I10 Essential (primary) hypertension: Secondary | ICD-10-CM

## 2014-01-09 MED ORDER — SPIRONOLACTONE-HCTZ 25-25 MG PO TABS: 1.0000 | ORAL_TABLET | Freq: Every day | ORAL | Status: DC

## 2014-01-09 MED ORDER — METOPROLOL SUCCINATE ER 100 MG PO TB24: ORAL_TABLET | ORAL | Status: DC

## 2014-01-09 NOTE — Telephone Encounter (Signed)
error 

## 2014-01-09 NOTE — Telephone Encounter (Signed)
Patient is in [place], and she requests a 3 day supply of her aldactazide,  lipitor and metop to [facility]. I sent her metoprolol in the same way it was last sent in, and the aldactazide as documented on 10/22/13 phone call note, but I am not sure about the lipitor as she has not been seen here yet. Please send in for her if appropriate. Thanks, MI

## 2014-01-12 ENCOUNTER — Ambulatory Visit: Payer: Self-pay

## 2014-01-12 NOTE — Telephone Encounter (Signed)
called pt.pt sts that she is ok on her meds.needs nothing further.

## 2014-01-13 ENCOUNTER — Ambulatory Visit: Payer: Self-pay

## 2014-01-14 ENCOUNTER — Ambulatory Visit: Payer: BC Managed Care – PPO | Admitting: Interventional Cardiology

## 2014-01-19 ENCOUNTER — Ambulatory Visit: Payer: Self-pay

## 2014-01-19 DIAGNOSIS — M545 Low back pain, unspecified: Secondary | ICD-10-CM

## 2014-01-19 DIAGNOSIS — M25561 Pain in right knee: Secondary | ICD-10-CM

## 2014-01-19 NOTE — Progress Notes (Signed)
Orthopedic Sports/Spine  PT Note    GENETTE HUERTAS   [id number]     Diagnosis: B LE/UE weakness, B wrist fractures    Subjective:  Pain Score:  3-4  Pain: Worsened, she reports she torqued her right knee when moving from sit to stand. Patient stated today she was nervous about doing any exercise with the knee. She states she can not tolerate laying with the knee in heel prop, she feels it is too much stress on her achilles.       Objective:  ROM - Worsened, Right, Knee Flexion 145, Extension lacks 10 degrees, lacks 5 degrees with over pressure.   Patient did not tolerate passive ROM.   Strength - Ther Ex per flowsheet  Function: - Worsened, unable to do balance exercises due to fear avoidance with recent knee pain.   Education:  Verbal cues for ther ex, patient encouraged to move knee and perform exercises     Objective   Outcomes 01/19/14:  LEFS: 46/80   ESES: 27/36    Recumbent bike    Agility ladder side steps     Heel toe walking  Heel toe walking looking up/down    Hurdle step overs     Marching     Balance training with perturbations     Heel prop 1 min (not tolerated)   Rockerboard lateral weight shifts with airex     Leg Press     HS curl    LAQ 3x10    Balance 1 foot    Calf raise 30x   Rice bucket wrist sup/pro     Flexbar wrist flex/ext     Flexbar wrist pro/sup     Ankle ABC 1x   Towel scrunch 1 min each   Clams    QS  30x   SLR       Treatment:  Ther Exercise per flowsheet    Assessment:  Continue present therapy treatment, declined many exercises today due to knee flare up.     Plan of Care:  Continue per Plan of care -  As written continue to progress balance, LE strengthening and general endurance as tolerated.     Thank you for referring this patient to [place], [place]

## 2014-01-20 ENCOUNTER — Ambulatory Visit: Payer: Self-pay | Admitting: Neurology

## 2014-01-20 ENCOUNTER — Encounter: Payer: Self-pay | Admitting: Neurology

## 2014-01-20 VITALS — BP 134/68 | HR 77 | Ht 63.0 in | Wt 118.0 lb

## 2014-01-20 DIAGNOSIS — R569 Unspecified convulsions: Secondary | ICD-10-CM

## 2014-01-20 NOTE — Progress Notes (Signed)
She returns today in follow up of her AVM, s/p resection and residual seizure disorder. Overall, she is doing well. No seizures and no change in her chronic level of fatigue. She states she sleeps well, feels rested in the AM, but has no stamina.  She cannot always get through even a one hour TV program.    She is still working one day per week. No change in mild forgetfulness. She is seeing a physical therapist and her trainer once weekly.    Her 16 point ROS was otherwise negative except for heartburn.    Exam:  Filed Vitals:    01/20/14 1035   BP: 134/68   Pulse: 77   Height: 1.6 m (5\' 3" )   Weight: 53.524 kg (118 lb)     She is alert and oriented. Her speech is clear. Comprehension is intact. She appeared upbeat today.     Cranial nerve examination reveals full versions without nystagmus. Visual fields were full to confrontation. She has full strength throughout. She continues to have a bilateral sustension tremor which appears unchanged from our last visit. She takes small, tentative steps with gait testing, turns en bloc.    Impression: Stable seizure disorder. She would like to hold on switching to lamictal at this time. The transition could lead to some gait imbalance so it does make sense to wait until the snow clears. Her lipid panel is much improved.   Her most recent CMZ levels were 2.5 (free) and 10.6 (total).     Follow up in 6 months.

## 2014-01-22 ENCOUNTER — Ambulatory Visit: Payer: Self-pay

## 2014-01-26 ENCOUNTER — Ambulatory Visit: Payer: Self-pay

## 2014-01-26 ENCOUNTER — Ambulatory Visit: Payer: BC Managed Care – PPO | Admitting: Nurse Practitioner

## 2014-01-26 DIAGNOSIS — M84376A Stress fracture, unspecified foot, initial encounter for fracture: Secondary | ICD-10-CM

## 2014-01-26 NOTE — Progress Notes (Signed)
Orthopedic Sports/Spine  PT Note    [name]   [id number]     Diagnosis: B LE/UE weakness, B wrist fractures    Subjective:  Pain Score:  0  Pain: Improved, she reports she "can't find any pain today."  Her knee is back to normal. She reports performing HEP 2x last week, once with the trainer. She was sick over the weekend and rested a lot.       Objective:  ROM - Improved, Right, Knee  WFL.   Strength - Ther Ex per flowsheet  Function: - Improved, not limited by knee pain today.   Education:  Verbal cues for ther ex    Objective     Recumbent bike    Agility ladder side steps  5 min   Heel toe walking  Heel toe walking looking up/down    Hurdle step overs     Marching     Balance training with perturbations  30''x2   Leg Press     HS curl 30x   LAQ #2 3x10    Balance 1 foot 30''x3    Calf raise 30x   Rice bucket wrist sup/pro     Flexbar wrist flex/ext  Gr 30x   Flexbar wrist pro/sup  Gr 30x   Ankle ABC 3x   Towel scrunch 1 min x2 each   Clams 7x unable hip pain   QS  30x   SLR       Treatment:  Ther Exercise per flowsheet    Assessment:  Continue present therapy treatment, declined clam due to hip pain when lying on side.     Plan of Care:  Continue per Plan of care -  As written continue to progress balance, LE strengthening and general endurance as tolerated.     Thank you for referring this patient to [place], [place]

## 2014-01-27 ENCOUNTER — Ambulatory Visit
Admit: 2014-01-27 | Discharge: 2014-01-27 | Disposition: A | Discharge status: Home / Self Care | Payer: Self-pay | Source: Ambulatory Visit | Attending: Primary Care | Admitting: Primary Care

## 2014-01-27 DIAGNOSIS — E785 Hyperlipidemia, unspecified: Secondary | ICD-10-CM

## 2014-01-27 DIAGNOSIS — E039 Hypothyroidism, unspecified: Secondary | ICD-10-CM

## 2014-01-27 DIAGNOSIS — R569 Unspecified convulsions: Secondary | ICD-10-CM

## 2014-01-27 DIAGNOSIS — E559 Vitamin D deficiency, unspecified: Secondary | ICD-10-CM

## 2014-01-27 DIAGNOSIS — G40909 Epilepsy, unspecified, not intractable, without status epilepticus: Secondary | ICD-10-CM

## 2014-01-27 DIAGNOSIS — I10 Essential (primary) hypertension: Secondary | ICD-10-CM

## 2014-01-27 LAB — LIPID PANEL
Chol/HDL Ratio: 1.9
Cholesterol: 196 mg/dL
HDL: 101 mg/dL
LDL Calculated: 77 mg/dL
Non HDL Cholesterol: 95 mg/dL
Triglycerides: 88 mg/dL

## 2014-01-27 LAB — COMPREHENSIVE METABOLIC PANEL
ALT: 25 U/L (ref 0–35)
AST: 21 U/L (ref 0–35)
Albumin: 4.6 g/dL (ref 3.5–5.2)
Alk Phos: 65 U/L (ref 35–105)
Anion Gap: 13 (ref 7–16)
Bilirubin,Total: 0.3 mg/dL (ref 0.0–1.2)
CO2: 26 mmol/L (ref 20–28)
Calcium: 8.6 mg/dL (ref 8.6–10.2)
Chloride: 103 mmol/L (ref 96–108)
Creatinine: 0.64 mg/dL (ref 0.51–0.95)
GFR,Black: 112 *
GFR,Caucasian: 97 *
Glucose: 89 mg/dL (ref 60–99)
Lab: 15 mg/dL (ref 6–20)
Potassium: 4.2 mmol/L (ref 3.3–5.1)
Sodium: 142 mmol/L (ref 133–145)
Total Protein: 6.8 g/dL (ref 6.3–7.7)

## 2014-01-27 LAB — CBC
Hematocrit: 37 % (ref 34–45)
Hemoglobin: 11.9 g/dL (ref 11.2–15.7)
MCH: 32 pg/cell (ref 26–32)
MCHC: 32 g/dL (ref 32–36)
MCV: 99 fL — ABNORMAL HIGH (ref 79–95)
Platelets: 247 10*3/uL (ref 160–370)
RBC: 3.8 MIL/uL — ABNORMAL LOW (ref 3.9–5.2)
RDW: 13.7 % (ref 11.7–14.4)
WBC: 5.3 10*3/uL (ref 4.0–10.0)

## 2014-01-27 LAB — CK: CK: 72 U/L (ref 34–145)

## 2014-01-27 LAB — TSH: TSH: 1.76 u[IU]/mL (ref 0.27–4.20)

## 2014-01-30 ENCOUNTER — Encounter: Payer: Self-pay | Admitting: Primary Care

## 2014-01-30 ENCOUNTER — Ambulatory Visit: Payer: Self-pay | Admitting: Primary Care

## 2014-01-30 VITALS — BP 112/66 | HR 56 | Ht 63.5 in | Wt 119.8 lb

## 2014-01-30 DIAGNOSIS — Z23 Encounter for immunization: Secondary | ICD-10-CM

## 2014-01-30 DIAGNOSIS — E785 Hyperlipidemia, unspecified: Secondary | ICD-10-CM

## 2014-01-30 DIAGNOSIS — E559 Vitamin D deficiency, unspecified: Secondary | ICD-10-CM

## 2014-01-30 DIAGNOSIS — Z0000 Encounter for general adult medical examination without abnormal findings: Secondary | ICD-10-CM

## 2014-01-30 DIAGNOSIS — E039 Hypothyroidism, unspecified: Secondary | ICD-10-CM

## 2014-01-30 LAB — VITAMIN D
25-OH VIT D2: <4 ng/mL
25-OH VIT D3: 44 ng/mL
25-OH Vit Total: 44 ng/mL (ref 30–60)

## 2014-01-30 LAB — CARBAMAZEPINE, FREE & TOTAL
Carbamazepine, Free: 24.1 % (ref 8.0–35.0)
Carbamazepine,Free: 2.6 ug/mL (ref 1.0–3.0)
Carbamazepine,Total: 10.8 ug/mL (ref 4.0–12.0)

## 2014-01-30 NOTE — Progress Notes (Addendum)
 History and Physical    HISTORY:  Chief Complaint   Patient presents with   . Results   . Annual Exam         History of Present Illness:    HPI  She has started to exercise a little.  PT doing core exercise and working with a trainer as well, this equates to 2 hours per week.      Allergies are stable.denies concern for sinusitis.    Taking the aciphex, but having sx almost all nights. The last two to three nights she has had more discomfort, usually gas-x will help to improve the sx, but not always.  Pain in the epigastrium,  trial of zantac with aciphex and gas-x. She worries about taking more Aciphex given her osteoporosis.    Hyperlipidemia-has tolerated the Lipitor quite well.  Denies myalgia.  Liver function tests stable.  Total cholesterol 196, triglyceride 88, HDL 101 and LDL 77.  Her neurologist was also pleased to see these results.    Hypertension-has been well controlled on current regimen.  Patient denies chest pain, shortness of breath.  Denies dizziness.  Denies headache.    Osteoporosis-patient remains on prolia.  Takes calcium and vitamin D regularly.  As above, is getting more weight-bearing exercise.  Up-to-date on bone density scanning.    H/o Seizure--recently saw Dr. Soyla Dryer, he is recommending that she wait until Summer to make the switch to lamictal.  Problems:  Patient Active Problem List   Diagnosis Code   . Reported Trauma Neck    . Allergic Rhinitis 477.9   . Irritable bowel syndrome 564.1   . Hypertension 401.9   . Dysplastic Nevus 448.1   . Rosacea 695.3   . Osteoporosis 733.00   . Hypothyroidism 244.9   . Scoliosis 737.30   . Urge Incontinence Of Urine 788.31   . A-V Malformation Repair Supratentorial Complex    . Convulsive Disorder 780.39   . 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 Ringing In The Ears (Tinnitus) 388.30   . GERD (gastroesophageal reflux disease) 530.81   . Contusion of foot 924.20   . Pain in joint, ankle and foot 719.47        Past Medical/Surgical  History:   Past Medical History   Diagnosis Date   . Hypertension    . Hypothyroidism    . Osteoporosis    . Stroke    . Seizure      Past Surgical History   Procedure Laterality Date   . Converted procedure       Craniotomy A-V Malformation Repair Conversion Data    . Hx tonsillectomy/adenoidectomy       Tonsillectomy Conversion Data    . Tonsillectomy           Allergies:    Allergies   Allergen Reactions   . Bactrim Other (See Comments)     Serum sickness   . Dilantin Other (See Comments)      Edema   . Penicillins Hives   . Seasonal    . Sulfa Drugs Other (See Comments)     Serum sickness       Current medications:    Current Outpatient Prescriptions   Medication Sig   . metoprolol (TOPROL-XL) 100 MG 24 hr tablet Take 100 mg by mouth daily   . amLODIPine (NORVASC) 5 MG tablet Take 1 tablet (5 mg total) by mouth daily   . atorvastatin (LIPITOR) 10 MG tablet Take 1 tablet (10 mg  total) by mouth daily (with dinner)   . levothyroxine (SYNTHROID, LEVOTHROID) 25 MCG tablet Take 1 tablet (25 mcg total) by mouth daily (before breakfast)   . darifenacin (ENABLEX) 15 MG 24 hr tablet Take 1 tablet (15 mg total) by mouth daily   . azelastine (ASTELIN) 137 MCG/SPRAY nasal spray 2 sprays by Nasal route 4 times daily   AS DIRECTED   . TEGRETOL-XR 200 MG 12 hr tablet TAKE 2 TABLETS TWICE DAILY   . carBAMazepine (TEGRETOL XR) 100 MG 12 hr tablet Take 1 tablet (100 mg total) by mouth 2 times daily (with meals)   Along with Carbamazepine 200 mg tabs   . fluticasone (FLONASE) 50 MCG/ACT nasal spray 2 sprays by Nasal route daily   . Calcium Carb-Cholecalciferol 600-200 MG-UNIT TABS Take 1 tablet by mouth 3 times daily   . GuaiFENesin (MUCINEX PO) Take 1 tablet by mouth 3 times daily   . Denosumab (PROLIA SC) Inject 1 Device into the skin every 6 months   . loratadine (CLARITIN) 10 MG tablet Take 10 mg by mouth daily   . Multiple Vitamin (MULTIVITAMIN) per tablet Take 1 tablet by mouth daily   . metronidazole (NORITATE) 1 % cream  Apply topically daily   . cholecalciferol (VITAMIN D) 1000 UNITS capsule Take 1,000 Units by mouth 3 times daily   . rabeprazole (ACIPHEX) 20 MG tablet Take 20 mg by mouth daily      . ipratropium (ATROVENT HFA) 17 MCG/ACT inhaler Inhale 2 puffs into the lungs 4 times daily   Shake well before each use   . lamoTRIgine (LAMICTAL) 25 MG tablet Take 1 tablet (25 mg total) by mouth daily       Family History:    Family History   Problem Relation Age of Onset   . Conversion Other      20090924^Allergies^^Active^foods   . Conversion Other      20090924^Dementia^^Active^   . Conversion Other      16109604^VWUJWJ Health Status ___ Children Living^^Active^one son   . Conversion Other      20120201^Chronic Myelomonocytic Leukemia - In Remission^205.11^Active^cousin   . Conversion Other      20120201^Chronic Myelomonocytic Leukemia^205.10^Active^in his 90s   . Hypertension Mother    . Cancer Father        Social/Occupational History:   History     Social History   . Marital Status: Married     Spouse Name: N/A     Number of Children: N/A   . Years of Education: N/A     Social History Main Topics   . Smoking status: Never Smoker    . Smokeless tobacco: Never Used   . Alcohol Use: No   . Drug Use: No   . Sexual Activity: None     Other Topics Concern   . None     Social History Narrative    PT and trainer , each one hour weekly         Review of Systems:    Review of Systems   Constitutional: Negative for fever, chills and malaise/fatigue.   HENT: Positive for hearing loss. Negative for nosebleeds.         No progression   Eyes: Negative for blurred vision and double vision.   Respiratory: Negative for cough, shortness of breath and wheezing.    Cardiovascular: Negative for chest pain, palpitations and leg swelling.   Gastrointestinal: Positive for heartburn, nausea and abdominal pain. Negative for diarrhea, blood in stool  and melena.        Occasional nausea with the GERD; epigastric pain   Genitourinary: Positive for urgency  and frequency. Negative for dysuria.   Musculoskeletal: Negative for myalgias.        Come and go with exercise   Skin: Positive for itching. Negative for rash.   Neurological: Negative for dizziness, sensory change and headaches.        When her neck is bothering her   Numbness with the orthopedic problems, improving with the PT  Balance is much better with PT   Psychiatric/Behavioral: Negative for depression. The patient is not nervous/anxious and does not have insomnia.         Usual       Vital Signs:   BP 112/66   Pulse 56   Ht 1.613 m (5' 3.5")   Wt 54.341 kg (119 lb 12.8 oz)   BMI 20.89 kg/m2         PHYSICAL EXAM:  Physical Exam   Constitutional: She appears well-developed and well-nourished. No distress.   HENT:   Head: Normocephalic.   Right Ear: Tympanic membrane, external ear and ear canal normal.   Left Ear: Tympanic membrane, external ear and ear canal normal.   Nose: No mucosal edema. No epistaxis.   Mouth/Throat: Uvula is midline, oropharynx is clear and moist and mucous membranes are normal.   Eyes: Conjunctivae, EOM and lids are normal. Pupils are equal, round, and reactive to light.   Neck: Trachea normal. Neck supple. No JVD present. Carotid bruit is not present. No thyroid mass and no thyromegaly present.   Cardiovascular: Normal rate, regular rhythm, S1 normal and S2 normal.  Exam reveals no gallop.    No murmur heard.  Pulses:       Dorsalis pedis pulses are 2+ on the right side, and 2+ on the left side.        Posterior tibial pulses are 2+ on the right side, and 2+ on the left side.   Pulmonary/Chest: Effort normal and breath sounds normal. She has no wheezes. She has no rhonchi. She has no rales.   Patient has fibrocystic breasts, however no mass or axillary adenopathy appreciated.   Abdominal: Soft. Bowel sounds are normal. She exhibits no distension. There is no hepatosplenomegaly. There is no tenderness.   Musculoskeletal: She exhibits no edema or tenderness.   Lymphadenopathy:      She has no cervical adenopathy.     She has no axillary adenopathy.        Right: No inguinal adenopathy present.        Left: No inguinal adenopathy present.   Neurological: She is alert. She has normal strength an

## 2014-01-30 NOTE — Progress Notes (Signed)
Pt tolerated Zostavax injection as part of routine health maintenance.

## 2014-02-02 ENCOUNTER — Ambulatory Visit: Payer: Self-pay

## 2014-02-02 DIAGNOSIS — S5290XA Unspecified fracture of unspecified forearm, initial encounter for closed fracture: Secondary | ICD-10-CM

## 2014-02-02 DIAGNOSIS — M84376A Stress fracture, unspecified foot, initial encounter for fracture: Secondary | ICD-10-CM

## 2014-02-02 NOTE — Progress Notes (Signed)
Orthopedic Sports/Spine  PT Note    BREAH JOA   [id number]     Diagnosis: B LE/UE weakness, B wrist fractures    Subjective:  Pain Score:  0  Pain: Improved, she reports she is doing well, requests wrist stretching today. She feels she is more stiff and painful at elbow and shoulder when she compensates due to her limited wrists.     Objective:  ROM - Improved, Right, Knee  WFL.   Strength - Ther Ex per flowsheet  Function: - Improved  Education:  Verbal cues for ther ex    Objective     Recumbent bike    Agility ladder side steps     Heel toe walking  Heel toe walking looking up/down    Hurdle step overs     Marching with high  knees 30x 2   Passive wrist stretching  4 minutes   Leg Press     Ham String curl #2 30x   LAQ  3x10    Balance 1 foot 30''x2    Calf raise standing 30x   Seated Calf raise and toe raise 30x each   Flexbar wrist flex/ext  Gr 30x   Flexbar wrist pro/sup  Gr 30x   Ankle ABC 3x   Towel scrunch 1 min x2 each   Clams unable hip pain   QS  30x   SLR       Treatment:  Ther Exercise per flowsheet    Assessment:  Continue present therapy treatment,    Plan of Care:  Continue per Plan of care -  As written continue to progress balance, LE strengthening and general endurance as tolerated.     Thank you for referring this patient to [place], [place]

## 2014-02-06 ENCOUNTER — Ambulatory Visit: Payer: Self-pay

## 2014-02-07 ENCOUNTER — Encounter: Payer: Self-pay | Admitting: Interventional Cardiology

## 2014-02-09 ENCOUNTER — Ambulatory Visit: Payer: Self-pay

## 2014-02-09 DIAGNOSIS — S5290XA Unspecified fracture of unspecified forearm, initial encounter for closed fracture: Secondary | ICD-10-CM

## 2014-02-09 DIAGNOSIS — M84376A Stress fracture, unspecified foot, initial encounter for fracture: Secondary | ICD-10-CM

## 2014-02-09 NOTE — Progress Notes (Cosign Needed)
Orthopedic Sports/Spine  PT Note    [name]   [id number]     Diagnosis: B LE/UE weakness, B wrist fractures    Subjective:  Pain Score:  0  Pain: Improved, she reports she is doing well, she was so tired after last session she laid down for 45 minutes.  She was also experiencing muscular soreness. She states she does not want to try any standing exercises with her shoes off. She reports Dr. Criselda Peaches told her to never do anything with her shoes off. She has a follow-up scheduled and will ask her about standing with shoes off.     Objective:  ROM - Improved, Right, Knee  WFL.   Strength - Ther Ex per flowsheet  Function: - Improved  Education:  Verbal cues for ther ex    Objective     Recumbent bike    Agility ladder side steps     Heel toe walking  Heel toe walking looking up/down    Hurdle step overs     Marching with high  knees 30x 2   Passive wrist stretching  4 minutes   Leg Press     Ham String curl #1 30x   LAQ #1 3x10    Balance 1 foot airex 30''x 3 each foot    Calf raise standing 30x   Seated Calf raise and toe raise 30x each   Flexbar wrist flex/ext  Gr 30x   Flexbar wrist pro/sup  Gr 30x   Ankle ABC 3x   Towel scrunch 30x   Clams unable hip pain   QS  30x   Sit to stand 10x     Treatment:  Ther Exercise per flowsheet    Assessment:  Continue present therapy treatment,  Continues to have significant foot and ankle atrophy along with fear avoidance beliefs.       Plan of Care:  Continue per Plan of care -  As written continue to progress balance, LE strengthening and general endurance as tolerated.     Thank you for referring this patient to [place], [place]

## 2014-02-10 ENCOUNTER — Ambulatory Visit: Payer: Self-pay

## 2014-02-16 ENCOUNTER — Ambulatory Visit: Payer: Self-pay

## 2014-02-19 ENCOUNTER — Ambulatory Visit: Payer: Self-pay | Admitting: Orthopedic Surgery

## 2014-02-23 ENCOUNTER — Ambulatory Visit: Payer: Self-pay

## 2014-02-24 ENCOUNTER — Encounter: Payer: Self-pay | Admitting: Interventional Cardiology

## 2014-02-24 ENCOUNTER — Ambulatory Visit (INDEPENDENT_AMBULATORY_CARE_PROVIDER_SITE_OTHER): Payer: BC Managed Care – PPO | Admitting: Interventional Cardiology

## 2014-02-24 VITALS — BP 124/88 | HR 72 | Ht 66.0 in | Wt 164.8 lb

## 2014-02-24 DIAGNOSIS — Z9889 Other specified postprocedural states: Secondary | ICD-10-CM

## 2014-02-24 DIAGNOSIS — Z8679 Personal history of other diseases of the circulatory system: Secondary | ICD-10-CM

## 2014-02-24 DIAGNOSIS — I10 Essential (primary) hypertension: Secondary | ICD-10-CM | POA: Insufficient documentation

## 2014-02-24 DIAGNOSIS — I4891 Unspecified atrial fibrillation: Secondary | ICD-10-CM

## 2014-02-24 NOTE — Patient Instructions (Signed)
Your physician recommends that you continue on your current medications as directed. Please refer to the Current Medication list given to you today.  Your physician has requested that you have an echocardiogram. Echocardiography is a painless test that uses sound waves to create images of your heart. It provides your doctor with information about the size and shape of your heart and how well your heart's chambers and valves are working. This procedure takes approximately one hour. There are no restrictions for this procedure.   Your physician wants you to follow-up in: 1 year You will receive a reminder letter in the mail two months in advance. If you don't receive a letter, please call our office to schedule the follow-up appointment.   

## 2014-02-24 NOTE — Progress Notes (Signed)
Patient ID: Ariel Simpson, female   DOB: 1956/08/06, 58 y.o.   MRN: [id number]    [address], [address] [place], [place]  [address] Phone: [phone] Fax:  [phone]  Date:  02/24/2014   ID:  Ariel Simpson, DOB 07/03/1956, MRN [id number]  PCP:  Lynne Logan, MD   ASSESSMENT:  1. Mitral valve repair, stable without clinical evidence of regurgitation 2. Diastolic hypertension, stable 3. History of paroxysmal atrial fibrillation and tachycardia, asymptomatic. She is status post Maze procedure at the time of mitral valve repair in 2014  PLAN:  1. Continue current medical regimen. 2. 2-D echocardiogram 3. Continue aerobic exercise 3. Clinical followup in one year   SUBJECTIVE: Ariel Simpson is a 58 y.o. female who feels well. She denies chest pain. No prolonged palpitations, syncope, or exertional limitations. She denies edema. No transient neurological symptoms. No medication side effects.   Wt Readings from Last 3 Encounters:  02/24/14 164 lb 12.8 oz (74.753 kg)  09/17/13 167 lb 1.6 oz (75.796 kg)  07/28/13 167 lb (75.751 kg)     Past Medical History  Diagnosis Date  . Mitral valve prolapse   . High cholesterol   . Heart murmur   . Severe mitral regurgitation 03/03/2013    Acute on chronic with acute heart failure   . Mitral regurgitation   . Paroxysmal supraventricular tachycardia   . Paroxysmal atrial fibrillation 03/11/2013  . Shortness of breath   . PONV (postoperative nausea and vomiting)     Dizziness and Nausea  . CHF (congestive heart failure)   . Hypertension   . S/P mitral valve repair 03/18/2013    Complex valvuloplasty including triangular resection of posterior leaflet, artificial Gore-tex neocord placement x4 and 13mm Sorin Memo 3D ring annuloplasty via right mini thoracotomy  . S/P Maze operation for atrial fibrillation 03/18/2013    Complete bilateral atrial lesion set using cryothermy and biopolar radiofrequency ablation with oversewing of LA  appendage via right mini thoracotomy    Current Outpatient Prescriptions  Medication Sig Dispense Refill  . aspirin 325 MG tablet Take 325 mg by mouth daily.      Marland Kitchen atorvastatin (LIPITOR) 20 MG tablet Take 20 mg by mouth daily.      . Calcium Carbonate-Vitamin D (CALCIUM + D PO) Take 2 capsules by mouth daily.      Marland Kitchen GLUCOSA-CHONDR-NA CHONDR-MSM PO Take 2 capsules by mouth daily.      . metoprolol succinate (TOPROL-XL) 100 MG 24 hr tablet TAKE 1 TABLET BY MOUTH EVERY DAY  3 tablet  0  . Omega-3 Fatty Acids (FISH OIL PO) Take 2 capsules by mouth daily.      Marland Kitchen spironolactone-hydrochlorothiazide (ALDACTAZIDE) 25-25 MG per tablet Take 1 tablet by mouth daily.  3 tablet  0   No current facility-administered medications for this visit.    Allergies:   No Known Allergies  Social History:  The patient  reports that she has never smoked. She has never used smokeless tobacco. She reports that she drinks about 1.8 ounces of alcohol per week. She reports that she does not use illicit drugs.   ROS:  Please see the history of present illness.   Stable weight. No chills or fever.   All other systems reviewed and negative.   OBJECTIVE: VS:  BP 124/88  Pulse 72  Ht 5\' 6"  (1.676 m)  Wt 164 lb 12.8 oz (74.753 kg)  BMI 26.61 kg/m2 Well nourished, well developed, in no acute distress, appears  healthy HEENT: normal Neck: JVD flat. Carotid bruit absent  Cardiac:  normal S1, S2; RRR; no murmur Lungs:  clear to auscultation bilaterally, no wheezing, rhonchi or rales Abd: soft, nontender, no hepatomegaly Ext: Edema  absent. Pulses 2+ and symmetric Skin: warm and dry Neuro:  CNs 2-12 intact, no focal abnormalities noted  EKG:  Not repeated       Signed, Illene Labrador III, MD 02/24/2014 1:25 PM

## 2014-02-25 ENCOUNTER — Ambulatory Visit (HOSPITAL_COMMUNITY)
Admission: RE | Admit: 2014-02-25 | Discharge: 2014-02-25 | Disposition: A | Discharge status: Home / Self Care | Payer: BC Managed Care – PPO | Source: Ambulatory Visit | Attending: Internal Medicine | Admitting: Internal Medicine

## 2014-02-25 DIAGNOSIS — I059 Rheumatic mitral valve disease, unspecified: Secondary | ICD-10-CM | POA: Insufficient documentation

## 2014-02-25 DIAGNOSIS — Z9889 Other specified postprocedural states: Secondary | ICD-10-CM

## 2014-02-25 NOTE — Progress Notes (Signed)
2D Echo Performed 02/25/2014    Devon Kingdon, RCS  

## 2014-02-27 ENCOUNTER — Ambulatory Visit: Payer: Self-pay

## 2014-02-27 DIAGNOSIS — M2140 Flat foot [pes planus] (acquired), unspecified foot: Secondary | ICD-10-CM

## 2014-03-02 ENCOUNTER — Encounter: Payer: Self-pay | Admitting: Gastroenterology

## 2014-03-02 ENCOUNTER — Telehealth: Payer: Self-pay

## 2014-03-02 LAB — HM MAMMOGRAPHY

## 2014-03-03 ENCOUNTER — Ambulatory Visit: Payer: Self-pay

## 2014-03-03 DIAGNOSIS — S52209B Unspecified fracture of shaft of unspecified ulna, initial encounter for open fracture type I or II: Secondary | ICD-10-CM

## 2014-03-03 DIAGNOSIS — S52309B Unspecified fracture of shaft of unspecified radius, initial encounter for open fracture type I or II: Secondary | ICD-10-CM

## 2014-03-03 DIAGNOSIS — M84376A Stress fracture, unspecified foot, initial encounter for fracture: Secondary | ICD-10-CM

## 2014-03-03 NOTE — Progress Notes (Signed)
Orthopedic Sports/Spine  PT Note    [name]   [id number]     Diagnosis: B LE/UE weakness, B wrist fractures    Subjective:  Pain Score:  0  Pain: Improved, she reports she is doing well, she was so tired after last session she laid down for 45 minutes.  She was also experiencing muscular soreness. She states she does not want to try any standing exercises with her shoes off. She reports Dr. Criselda Peaches told her to never do anything with her shoes off. She has a follow-up scheduled and will ask her about standing with shoes off.     Objective:  ROM - Improved, Right, Knee  WFL.   Strength - Ther Ex per flowsheet  Function: - Improved  Education:  Verbal cues for ther ex    Objective     Recumbent bike 2.5 min   Agility ladder side steps     Heel toe walking  Heel toe walking looking up/down    Hurdle step overs     Marching with high  knees 30x 2   Passive wrist stretching  4 minutes   Standing Marching (high knees) #2 30x   Ham String curl #2 30x   LAQ #1    Balance 1 foot (with head turns) 10 rotations x2 each foot   Calf raise standing 30x   Seated Calf raise and toe raise 30x each   Flexbar wrist flex/ext  Gr 30x   Flexbar wrist pro/sup  Gr 30x   Ankle ABC 3x   Towel scrunch 30x   Clams unable hip pain   QS  30x   Sit to stand 10x2     Treatment:  Ther Exercise per flowsheet    Assessment:  Continue present therapy treatment, patient has significant apprehension and fear of vestibular rehabilitation.   Continues to have significant foot and ankle atrophy along with fear avoidance beliefs.  Poor endurance, only able to tolerate 2.5 minutes on the bike.       Plan of Care:  Continue per Plan of care -  As written continue to progress balance, LE strengthening and general endurance as tolerated.     Thank you for referring this patient to [place], [place]

## 2014-03-09 ENCOUNTER — Ambulatory Visit: Payer: Self-pay

## 2014-03-09 DIAGNOSIS — S5290XA Unspecified fracture of unspecified forearm, initial encounter for closed fracture: Secondary | ICD-10-CM

## 2014-03-09 DIAGNOSIS — M84376A Stress fracture, unspecified foot, initial encounter for fracture: Secondary | ICD-10-CM

## 2014-03-09 NOTE — Progress Notes (Signed)
Orthopedic Sports/Spine  PT Note    [name]   [id number]     Diagnosis: B LE/UE weakness, B wrist fractures    Subjective:  Pain Score:  0  Pain: Improved,   She feels she is better since last visit. She continues to work with the trainer. Reports fatigue with exercises. She reports going to the grocery store to buy one item, which she tolerated well without dizziness or severe fatigue.     Objective:  ROM - Improved, Right, Knee  WFL.   Strength - Ther Ex per flowsheet  Function: - Improved  Education:  Verbal cues for ther ex    Objective     Recumbent bike 4 min   Agility ladder side steps     Heel toe walking  Heel toe walking looking up/down    Hurdle step overs          Passive wrist stretching  4 minutes   Standing Marching (high knees) #2 30x   Ham String curl #2 30x   LAQ #2 39x   Balance 1 foot (with head turns) 2x10   Calf raise standing 30x   Seated Calf raise and toe raise 30x each   Flexbar wrist flex/ext  Gr 30x   Flexbar wrist pro/sup  Gr 30x   Ankle ABC 3x   Towel scrunch 30x   Clams unable hip pain   QS  30x   Sit to stand 10x2     Treatment:  Ther Exercise per flowsheet    Assessment:  Continue present therapy treatment, patient has significant apprehension and fear of vestibular rehabilitation.   Continues to have significant foot and ankle atrophy along with fear avoidance beliefs.  Poor endurance, only able to tolerate 2.5 minutes on the bike.       Plan of Care:  Continue per Plan of care -  As written continue to progress balance, LE strengthening and general endurance as tolerated.     Thank you for referring this patient to [place], [place]

## 2014-03-11 ENCOUNTER — Ambulatory Visit: Payer: Self-pay | Admitting: Orthopedic Surgery

## 2014-03-11 ENCOUNTER — Encounter: Payer: Self-pay | Admitting: Orthopedic Surgery

## 2014-03-11 VITALS — Ht 63.0 in | Wt 118.0 lb

## 2014-03-11 DIAGNOSIS — S9030XA Contusion of unspecified foot, initial encounter: Secondary | ICD-10-CM

## 2014-03-11 DIAGNOSIS — R29898 Other symptoms and signs involving the musculoskeletal system: Secondary | ICD-10-CM

## 2014-03-11 NOTE — Progress Notes (Signed)
PATIENTGESSELLE, [name] MR #:  [id number]   ACCOUNT #:  [id number] DOB:  Sep 30, 1956   DICTATED BY:  Berton Lan, MD DATE OF VISIT:  03/11/2014     PROBLEM:  Bilateral foot weakness.    HISTORY OF PRESENT ILLNESS:  The patient is a 58 year old woman, who has not been seen in this office for about 15 years, was seen in Dr. Orlene Erm office about 5 years ago and Dr. Deatra Canter office a couple of years ago, who was referred by Dr. Cloyde Reams due due to her ongoing weakness in her feet.  Apparently, since 2010 she has been going for physical therapy to some extent for her upper extremities but also for balance and foot strengthening. Fifteen years ago  I did recommend that the patient utilize her shoes at all times, and she took that to heart and does not walk anywhere even to the bathroom without putting her shoes on first. She has marked foot atrophy and plantar fat pad atrophy, she says it is hard to find shoes that fit into any type of additional orthotic that is less sturdy than the one that was made for her by an outside pedorthist without my prescription a full-length plastic foot bed and a UCBL.  She did have a stroke that led to some right ankle weakness in 2004 as well, which is been rolled into the plan to her rehab as well.  Her past medical history, medications, surgeries, allergies, social history, and family history are all reviewed with the patient including the patient's chart.    PHYSICAL EXAMINATION:  The patient has bilateral plantar fat pad atrophy, as well as atrophy of all the intrinsics of her toes.  Although she can wiggle her toes to some extent,  it is really quite minimal.  Her ankle motion is about 5 degrees of dorsiflexion, 20 degrees of plantar flexion.  She has a marked flatfoot deformity without any excessive pronation.  There are no open skin sores.  There is no tenderness to palpation in her foot that I could appreciate today.    IMAGING:  X-rays of her feet demonstrate that  she had an old stress fracture and still has some stress reaction to the bone around the 2nd metatarsal and some pale component to her appearance of her bones, which may be suggestive of some osteoporosis.    ASSESSMENT:  Bilateral foot weakness:  I suggested that the patient needs to try and do some exercises for her feet. She is reluctant to do anything without her shoes on, and she is reluctant to participate in activities with the physical therapist that she does not trust. I have suggested she get a physical therapist that she trusts since this is basic  maintenance that she is doing and has been doing for years. I have suggested she contact her primary care doctor, Dr. Guadalupe Maple to work with her on a rehab protocol for her lower extremities and her upper extremities.  She does not have any foot pain per se. I have made recommendations to try and wean out of her orthotics into a different type of orthotic, and she is trying to do that, although has some difficulty. It is going to be a slow process.  I have also made recommendations for water therapy, as I think that might be helpful to her but she seems to be somewhat more interested in land therapy. I will have the patient follow up with her primary care doctor  as there is nothing additional that I feel I have to offer for her.             ______________________________  Berton Lan, MD    JFB/MODL  DD:  03/11/2014 09:58:45  DT:  03/11/2014 11:08:44  Job #:  [id number]    cc: Berton Lan, MD

## 2014-03-16 ENCOUNTER — Ambulatory Visit: Payer: Self-pay | Admitting: Primary Care

## 2014-03-16 ENCOUNTER — Encounter: Payer: Self-pay | Admitting: Primary Care

## 2014-03-16 ENCOUNTER — Ambulatory Visit: Payer: Self-pay

## 2014-03-16 ENCOUNTER — Encounter: Payer: Self-pay | Admitting: Thoracic Surgery (Cardiothoracic Vascular Surgery)

## 2014-03-16 ENCOUNTER — Ambulatory Visit (INDEPENDENT_AMBULATORY_CARE_PROVIDER_SITE_OTHER): Payer: BC Managed Care – PPO | Admitting: Thoracic Surgery (Cardiothoracic Vascular Surgery)

## 2014-03-16 VITALS — BP 128/80 | HR 66 | Temp 97.6°F | Wt 120.2 lb

## 2014-03-16 VITALS — BP 122/78 | HR 68 | Resp 16 | Ht 67.0 in | Wt 164.0 lb

## 2014-03-16 DIAGNOSIS — Z9889 Other specified postprocedural states: Secondary | ICD-10-CM

## 2014-03-16 DIAGNOSIS — I059 Rheumatic mitral valve disease, unspecified: Secondary | ICD-10-CM

## 2014-03-16 DIAGNOSIS — Z8679 Personal history of other diseases of the circulatory system: Secondary | ICD-10-CM

## 2014-03-16 DIAGNOSIS — S5290XA Unspecified fracture of unspecified forearm, initial encounter for closed fracture: Secondary | ICD-10-CM

## 2014-03-16 DIAGNOSIS — M84376A Stress fracture, unspecified foot, initial encounter for fracture: Secondary | ICD-10-CM

## 2014-03-16 DIAGNOSIS — N61 Mastitis without abscess: Secondary | ICD-10-CM

## 2014-03-16 MED ORDER — DOXYCYCLINE MONOHYDRATE 100 MG PO CAPS *I*
100.0000 mg | ORAL_CAPSULE | Freq: Two times a day (BID) | ORAL | Status: AC
Start: 2014-03-16 — End: 2014-03-30

## 2014-03-16 NOTE — Progress Notes (Signed)
Orthopedic Sports/Spine  PT Note    [name]   [id number]     Diagnosis: B LE/UE weakness, B wrist fractures    Subjective:  Pain Score:  0  Pain: Improved,   She states "things are getting easier to do."      Objective:  ROM - Improved, Right, Knee  WFL.   Strength - Ther Ex per flowsheet  Function: - Improved  Education:  Verbal cues for ther ex    Objective     Recumbent bike 5.5 min   Agility ladder side steps     Heel toe walking  Heel toe walking looking up/down    Hurdle step overs     UBE try    Passive wrist stretching  4 minutes   Standing Marching (high knees) #2 30x   Ham String curl #2 30x   LAQ #2 39x   Balance 1 foot (with head turns) 2x10   Calf raise standing 30x   Seated Calf raise and toe raise 30x each   Flexbar wrist flex/ext  Gr 30x   Flexbar wrist pro/sup  Gr 30x   Ankle ABC 3x   Towel scrunch 30x   Clams unable hip pain   QS  30x   Sit to stand 10x2     Treatment:  Ther Exercise per flowsheet    Assessment:  Continue present therapy treatment, patient has significant apprehension and fear of vestibular rehabilitation.   Continues to have significant foot and ankle atrophy along with fear avoidance beliefs.  Poor endurance, only able to tolerate 2.5 minutes on the bike.       Plan of Care:  Continue per Plan of care -  As written continue to progress balance, LE strengthening and general endurance as tolerated.     Thank you for referring this patient to [place], [place]

## 2014-03-16 NOTE — Progress Notes (Signed)
[address]       [place] [address]             [phone]     CARDIOTHORACIC SURGERY OFFICE NOTE  Referring Provider is Sinclair Grooms, MD PCP is Lynne Logan, MD   HPI:  Patient returns for routine followup and rhythm check status post minimally invasive mitral valve repair and Maze procedure 03/18/2013.  She was last seen here in our office on 07/24/2013. Since then she has continued to do very well. She was seen in followup recently by Dr. Tamala Julian performed a routine followup echocardiogram which revealed normal left ventricular function and intact mitral valve repair. The patient continues to maintain sinus rhythm. She returns to the office today feeling very well. She enjoys normal physical activity without any limitations whatsoever. She specifically denies any symptoms of exertional shortness of breath or chest discomfort. She has not had any further tachypalpitations or dizzy spells. Overall she is delighted with how she is doing presently.  Her previous right groin lymphocele resolved completely.    Current Outpatient Prescriptions  Medication Sig Dispense Refill  . aspirin 325 MG tablet Take 325 mg by mouth daily.      Marland Kitchen atorvastatin (LIPITOR) 20 MG tablet Take 20 mg by mouth daily.      . Calcium Carbonate-Vitamin D (CALCIUM + D PO) Take 2 capsules by mouth daily.      Marland Kitchen GLUCOSA-CHONDR-NA CHONDR-MSM PO Take 2 capsules by mouth daily.      . metoprolol succinate (TOPROL-XL) 100 MG 24 hr tablet TAKE 1 TABLET BY MOUTH EVERY DAY  3 tablet  0  . Omega-3 Fatty Acids (FISH OIL PO) Take 2 capsules by mouth daily.      Marland Kitchen spironolactone-hydrochlorothiazide (ALDACTAZIDE) 25-25 MG per tablet Take 1 tablet by mouth daily.  3 tablet  0   No current facility-administered medications for this visit.      Physical Exam:   BP 122/78  Pulse 68  Resp 16  Ht 5\' 7"  (1.702 m)  Wt 164 lb (74.39 kg)  BMI 25.68 kg/m2  SpO2  98%  General:  Well-appearing  Chest:   Clear  CV:   Regular rate and rhythm without murmur  Incisions:  Completely healed  Abdomen:  Soft  Extremities:  Warm  Diagnostic Tests:  2 channel telemetry rhythm strip demonstrates normal sinus rhythm   Transthoracic Echocardiography  Patient:    Ariel, Simpson MR #:       [id number] Study Date: 02/25/2014 Gender:     F Age:        58 Height:     167.6cm Weight:     74.4kg BSA:        1.26m^2 Pt. Status: Room:    ORDERING     Olen Pel  REFERRING    Olen Pel  ATTENDING    Lyman Bishop  SONOGRAPHER  Marygrace Drought, RCS  PERFORMING   [facility], Outpatient cc:  ------------------------------------------------------------ LV EF: 60% -   65%  ------------------------------------------------------------ Indications:      424.0 Mitral valve disease.  ------------------------------------------------------------ History:   PMH:  s/p MVR  Atrial fibrillation.  Risk factors:  Hypertension.  ------------------------------------------------------------ Study Conclusions  - Left ventricle: The cavity size was mildly dilated. Wall   thickness was normal. Systolic function was normal. The   estimated ejection fraction was in the range of 60% to   65%. - Mitral valve: Prior procedures included surgical  repair.   Mild regurgitation. Valve area by pressure half-time:   2.18cm^2. - Left atrium: The atrium was mildly dilated. Transthoracic echocardiography.  M-mode, complete 2D, spectral Doppler, and color Doppler.  Height:  Height: 167.6cm. Height: 66in.  Weight:  Weight: 74.4kg. Weight: 163.7lb.  Body mass index:  BMI: 26.5kg/m^2.  Body surface area:    BSA: 1.27m^2.  Blood pressure:     124/88.  Patient status:  Outpatient.  Location:  Echo laboratory.  ------------------------------------------------------------  ------------------------------------------------------------ Left ventricle:  The cavity size was mildly  dilated. Wall thickness was normal. Systolic function was normal. The estimated ejection fraction was in the range of 60% to 65%.   ------------------------------------------------------------ Aortic valve:   Structurally normal valve.   Cusp separation was normal.  Doppler:  Transvalvular velocity was within the normal range. There was no stenosis.  No regurgitation. VTI ratio of LVOT to aortic valve: 0.94. Peak velocity ratio of LVOT to aortic valve: 0.89.    Mean gradient: 31mm Hg (S).   ------------------------------------------------------------ Aorta:  Aortic root: The aortic root was normal in size. Ascending aorta: The ascending aorta was normal in size.  ------------------------------------------------------------ Mitral valve:  Prior procedures included surgical repair. Doppler:   Mild regurgitation.    Valve area by pressure half-time: 2.18cm^2. Indexed valve area by pressure half-time: 1.18cm^2/m^2.    Mean gradient: 28mm Hg (D). Peak gradient: 4mm Hg (D).  ------------------------------------------------------------ Left atrium:  LA volume/ BSA = 25.5 ml/m2 The atrium was mildly dilated.  ------------------------------------------------------------ Right ventricle:  The cavity size was normal. Systolic function was normal.  ------------------------------------------------------------ Pulmonic valve:    Structurally normal valve.   Cusp separation was normal.  Doppler:  Transvalvular velocity was within the normal range.  No regurgitation.  ------------------------------------------------------------ Tricuspid valve:   Structurally normal valve.   Leaflet separation was normal.  Doppler:  Transvalvular velocity was within the normal range.  Mild regurgitation.  ------------------------------------------------------------ Pulmonary artery:   Systolic pressure was within the normal range.  ------------------------------------------------------------ Right atrium:   The atrium was normal in size.  ------------------------------------------------------------ Pericardium:  There was no pericardial effusion.  ------------------------------------------------------------  2D measurements        Normal  Doppler measurements   Normal Left ventricle                 Main pulmonary LVID ED,   50.8 mm     43-52   artery chord,                         Pressure,    20 mm Hg  =30 PLAX                           S LVID ES,   30.8 mm     23-38   Left ventricle chord,                         Ea, lat    10.9 cm/s   ------ PLAX                           ann, tiss FS, chord,   39 %      >29     DP PLAX                           E/Ea, lat  12.3        ------ LVPW, ED   8.85 mm     ------  ann, tiss     9 IVS/LVPW   0.88        <1.3    DP ratio, ED                      Ea, med     6.1 cm/s   ------ Ventricular septum             ann, tiss IVS, ED    7.82 mm     ------  DP Aorta                          E/Ea, med  22.1        ------ Root diam,   35 mm     ------  ann, tiss     3 ED                             DP Left atrium                    LVOT AP dim       43 mm     ------  Peak vel,   108 cm/s   ------ AP dim     2.34 cm/m^2 <2.2    S index                          VTI, S     25.9 cm     ------ Right ventricle                Aortic valve RVID ED,   33.7 mm     19-38   Peak vel,   121 cm/s   ------ PLAX                           S                                Mean vel,  85.4 cm/s   ------                                S                                VTI, S     27.6 cm     ------                                Mean          3 mm Hg  ------                                gradient,                                S  VTI ratio  0.94        ------                                LVOT/AV                                Peak vel   0.89        ------                                ratio,                                LVOT/AV                                 Mitral valve                                Peak E vel  135 cm/s   ------                                Peak A vel 43.4 cm/s   ------                                Mean vel,  97.8 cm/s   ------                                D                                Decelerati  335 ms     150-23                                on time                0                                Pressure    101 ms     ------                                half-time                                Mean          5 mm Hg  ------                                gradient,  D                                Peak         17 mm Hg  ------                                gradient,                                D                                Peak E/A    3.1        ------                                ratio                                Area (PHT) 2.18 cm^2   ------                                Area index 1.18 cm^2/m ------                                (PHT)           ^2                                Annulus    56.2 cm     ------                                VTI                                Tricuspid valve                                Regurg      207 cm/s   ------                                peak vel                                Peak RV-RA   17 mm Hg  ------                                gradient,                                S  Systemic veins                                Estimated     3 mm Hg  ------                                CVP                                Right ventricle                                Pressure,    20 mm Hg  <30                                S   ------------------------------------------------------------ Prepared and Electronically Authenticated by  Mertie Moores 2015-04-08T13:58:48.507    Impression:  Patient is doing very well 1 year status post minimally invasive  mitral valve repair and Maze procedure. She has normal exercise tolerance without any symptoms of congestive heart failure or chest discomfort. Her late followup echocardiogram demonstrates normal left ventricular function was intact mitral valve repair. She is maintaining sinus rhythm.  Plan:  The patient will return in one year for routine followup and rhythm check. She has been reminded regarding the need for antibiotic prophylaxis for all dental procedures in other procedures associated with potential for bacteremia.   I spent in excess of 15 minutes during the conduct of this office consultation and >50% of this time involved direct face-to-face encounter with the patient for counseling and/or coordination of their care.   Valentina Gu. Roxy Manns, MD 03/16/2014 11:01 AM

## 2014-03-16 NOTE — Patient Instructions (Signed)

## 2014-03-17 NOTE — Progress Notes (Signed)
SUBJECTIVE:  She was in her usual state of health when she developed redness and pain in the right breast 2 days ago. For the previous few weeks she had been struggling to find a proper fitting bra. She had been finding that the brows were digging into the side of her breast. This became somewhat painful. The pain got worse over the weekend with associated redness. She denies any fevers chills or malaise. No nausea or vomiting. No discharge from the breast. She has similar episode of mastitis a few years ago. She had her last mammogram 2 weeks ago which was unremarkable. She has multiple drug allergies.      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    Ringing In The Ears (Tinnitus) 388.30    GERD (gastroesophageal reflux disease) 530.81    Contusion of foot 924.20    Pain in joint, ankle and foot 719.47    Weakness of foot 734       Allergies:  Bactrim; Dilantin; Penicillins; Seasonal; and Sulfa drugs    Meds:  Current outpatient prescriptions:metoprolol (TOPROL-XL) 100 MG 24 hr tablet, Take 100 mg by mouth daily, Disp: , Rfl: 0, ;  amLODIPine (NORVASC) 5 MG tablet, Take 1 tablet (5 mg total) by mouth daily, Disp: 90 tablet, Rfl: 4, ;  atorvastatin (LIPITOR) 10 MG tablet, Take 1 tablet (10 mg total) by mouth daily (with dinner), Disp: 90 tablet, Rfl: 3,   levothyroxine (SYNTHROID, LEVOTHROID) 25 MCG tablet, Take 1 tablet (25 mcg total) by mouth daily (before breakfast), Disp: 90 tablet, Rfl: 3, ;  darifenacin (ENABLEX) 15 MG 24 hr tablet, Take 1 tablet (15 mg total) by mouth daily, Disp: 90 tablet, Rfl: 3, ;   azelastine (ASTELIN) 137 MCG/SPRAY nasal spray, 2 sprays by Nasal route 4 times daily   AS DIRECTED, Disp: 180 mL, Rfl: 3,   TEGRETOL-XR 200 MG 12 hr tablet, TAKE 2 TABLETS TWICE DAILY, Disp: 120 tablet, Rfl: 5, ;  carBAMazepine (TEGRETOL XR) 100 MG 12 hr tablet, Take 1 tablet (100 mg total) by mouth 2 times daily (with meals)   Along with Carbamazepine 200 mg tabs, Disp: 180 tablet, Rfl: 3, ;  fluticasone (FLONASE) 50 MCG/ACT nasal spray, 2 sprays by Nasal route daily, Disp: 48 g, Rfl: 3,   Calcium Carb-Cholecalciferol 600-200 MG-UNIT TABS, Take 1 tablet by mouth 3 times daily, Disp: 30 each, Rfl: 3, ;  GuaiFENesin (MUCINEX PO), Take 1 tablet by mouth 3 times daily, Disp: , Rfl: , ;  Denosumab (PROLIA SC), Inject 1 Device into the skin every 6 months, Disp: , Rfl: , ;  loratadine (CLARITIN) 10 MG tablet, Take 10 mg by mouth daily, Disp: , Rfl: ,   Multiple Vitamin (MULTIVITAMIN) per tablet, Take 1 tablet by mouth daily, Disp: , Rfl: , ;  metronidazole (NORITATE) 1 % cream, Apply topically daily, Disp: , Rfl: , ;  cholecalciferol (VITAMIN D) 1000 UNITS capsule, Take 1,000 Units by mouth 3 times daily, Disp: , Rfl: , ;  rabeprazole (  ACIPHEX) 20 MG tablet, Take 20 mg by mouth daily   , Disp: , Rfl: ,   ipratropium (ATROVENT HFA) 17 MCG/ACT inhaler, Inhale 2 puffs into the lungs 4 times daily   Shake well before each use, Disp: 116.1 g, Rfl: 3, ;  lamoTRIgine (LAMICTAL) 25 MG tablet, Take 1 tablet (25 mg total) by mouth daily, Disp: 60 tablet, Rfl: 5,   Medication list reviewed and updated today during the visit.    OBJECTIVE:      BP 128/80    Pulse 66    Temp(Src) 36.4 C (97.6 F) (Temporal)    Wt 54.522 kg (120 lb 3.2 oz)   Well-appearing elderly woman in no acute distress. Examination of the right breast revealed an 8.5 cm x 10.5 cm area of ill-defined erythema which is blanching warm and tender, medial lateral aspect of the breast encompassing the areola.  No nipple discharge. There is an underlying firmness to  the breast which is tender. No fluctuance. There is one subcentimeter lymph node in the right axilla.there is no streaking.    ASSESSMENT and PLAN:  1.  Mastitis: May have been related to local trauma from her new broad or recent mammogram. Recommended 2 weeks of antibiotics. Fortunately, her allergy profile limits her choices. Really the only antibiotic she tolerates his doxycycline. Clindamycin would be another option but she has never taken it. Apparently doxycycline worked well for a similar problem a few years ago. We will start with that. If no improvement in 2-3 days, add clindamycin for MRSA coverage.  Recommend elevation of the breast and warm compresses. Followup in 7-10 days in the office. We will reevaluate the underlying firmness of the breast at that time. She may warrant imaging at that time once the acute infection has improved to evaluate for underlying abscess, cyst, or less likely malignancy.          Signed: Sharmaine Base, MD on 03/17/2014 at 9:01 AM  [facility] Internal Medicine

## 2014-03-20 ENCOUNTER — Ambulatory Visit: Payer: Self-pay

## 2014-03-20 DIAGNOSIS — M2140 Flat foot [pes planus] (acquired), unspecified foot: Secondary | ICD-10-CM

## 2014-03-23 ENCOUNTER — Ambulatory Visit: Payer: Self-pay

## 2014-03-23 ENCOUNTER — Ambulatory Visit: Payer: Self-pay | Admitting: Primary Care

## 2014-03-23 VITALS — BP 96/68 | HR 60 | Ht 63.0 in | Wt 120.4 lb

## 2014-03-23 DIAGNOSIS — R29898 Other symptoms and signs involving the musculoskeletal system: Secondary | ICD-10-CM

## 2014-03-23 DIAGNOSIS — N61 Mastitis without abscess: Secondary | ICD-10-CM

## 2014-03-23 NOTE — Progress Notes (Signed)
SUBJECTIVE:  Followup mastitis of the right breast.  [name] is feeling much better.  [name] has no more pain.  No fevers or chills.  No nausea vomiting or diarrhea.  The redness has improved significantly.          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    Ringing In The Ears (Tinnitus) 388.30    GERD (gastroesophageal reflux disease) 530.81    Contusion of foot 924.20    Pain in joint, ankle and foot 719.47    Weakness of foot 734       Allergies:  Bactrim; Dilantin; Penicillins; Seasonal; and Sulfa drugs    Meds:  Current outpatient prescriptions:doxycycline (MONODOX) 100 MG capsule, Take 1 capsule (100 mg total) by mouth 2 times daily for 14 days, Disp: 28 capsule, Rfl: 0, ;  metoprolol (TOPROL-XL) 100 MG 24 hr tablet, Take 100 mg by mouth daily, Disp: , Rfl: 0, ;  amLODIPine (NORVASC) 5 MG tablet, Take 1 tablet (5 mg total) by mouth daily, Disp: 90 tablet, Rfl: 4,   atorvastatin (LIPITOR) 10 MG tablet, Take 1 tablet (10 mg total) by mouth daily (with dinner), Disp: 90 tablet, Rfl: 3, ;  levothyroxine (SYNTHROID, LEVOTHROID) 25 MCG tablet, Take 1 tablet (25 mcg total) by mouth daily (before breakfast), Disp: 90 tablet, Rfl: 3, ;  darifenacin (ENABLEX) 15 MG 24 hr tablet, Take 1 tablet (15 mg total) by mouth daily, Disp: 90 tablet, Rfl: 3,   azelastine (ASTELIN) 137 MCG/SPRAY nasal spray, 2 sprays by Nasal route 4 times daily   AS DIRECTED, Disp: 180 mL, Rfl: 3, ;  TEGRETOL-XR 200 MG 12 hr tablet, TAKE 2 TABLETS TWICE DAILY, Disp: 120 tablet, Rfl: 5, ;  carBAMazepine (TEGRETOL XR) 100 MG 12 hr tablet, Take 1 tablet  (100 mg total) by mouth 2 times daily (with meals)   Along with Carbamazepine 200 mg tabs, Disp: 180 tablet, Rfl: 3,   fluticasone (FLONASE) 50 MCG/ACT nasal spray, 2 sprays by Nasal route daily, Disp: 48 g, Rfl: 3, ;  Calcium Carb-Cholecalciferol 600-200 MG-UNIT TABS, Take 1 tablet by mouth 3 times daily, Disp: 30 each, Rfl: 3, ;  lamoTRIgine (LAMICTAL) 25 MG tablet, Take 1 tablet (25 mg total) by mouth daily, Disp: 60 tablet, Rfl: 5, ;  GuaiFENesin (MUCINEX PO), Take 1 tablet by mouth 3 times daily, Disp: , Rfl: ,   Denosumab (PROLIA SC), Inject 1 Device into the skin every 6 months, Disp: , Rfl: , ;  loratadine (CLARITIN) 10 MG tablet, Take 10 mg by mouth daily, Disp: , Rfl: , ;  Multiple Vitamin (MULTIVITAMIN) per tablet, Take 1 tablet by mouth daily, Disp: , Rfl: , ;  metronidazole (NORITATE) 1 % cream, Apply topically daily, Disp: , Rfl: , ;  cholecalciferol (VITAMIN D) 1000 UNITS capsule, Take 1,000 Units by mouth 3 times daily, Disp: , Rfl: ,   rabeprazole (ACIPHEX) 20 MG tablet, Take 20 mg by mouth daily   , Disp: , Rfl: , ;  ipratropium (ATROVENT  HFA) 17 MCG/ACT inhaler, Inhale 2 puffs into the lungs 4 times daily   Shake well before each use, Disp: 116.1 g, Rfl: 3,   Medication list reviewed and updated today during the visit.    OBJECTIVE:      BP 96/68    Pulse 60    Ht 1.6 m (5\' 3" )    Wt 54.613 kg (120 lb 6.4 oz)    BMI 21.33 kg/m2     Well-appearing elderly woman in no acute distress.  Examination of the right breast reveals some very faint resolving erythema, lateral aspect of the right breast.  Deep to the erythema, there is still some persistent firmness of the underlying breast tissue compared to the left breast, although I do not palpate a discrete mass.  There is no axillary lymphadenopathy.  There is no tenderness or warmth.    ASSESSMENT and PLAN:  1.  resolving mastitis: Responding to doxycycline.  [name] will need another week.  Referred for breast ultrasound rule out underlying mass or cyst.   Her mammogram 3 weeks ago was unremarkable.    2.  Bilateral foot weakness: [name] was seen by podiatry a few weeks ago.  They recommended that we direct her physical therapy referral.  Referral provided for physical therapy.      Signed: Sharmaine Base, MD on 03/23/2014 at 8:34 AM  [facility]

## 2014-03-25 ENCOUNTER — Encounter: Payer: Self-pay | Admitting: Gastroenterology

## 2014-03-30 ENCOUNTER — Ambulatory Visit: Payer: Self-pay

## 2014-03-30 DIAGNOSIS — S5290XA Unspecified fracture of unspecified forearm, initial encounter for closed fracture: Secondary | ICD-10-CM

## 2014-03-30 DIAGNOSIS — M84376A Stress fracture, unspecified foot, initial encounter for fracture: Secondary | ICD-10-CM

## 2014-03-30 NOTE — Progress Notes (Signed)
Orthopedic Sports/Spine  PT Note    [name]   [id number]     Diagnosis: B LE/UE weakness, B wrist fractures    Subjective:  Pain Score:  1-2  Pain: Worsened, pain at the back and feet.    She states since last visit she has not had much change. 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 Her wrists and thumbs have been more sore. She feels it is possibly due to tieing her shoes a lot.       Objective:  ROM - Improved, Right, Knee  WFL.   Strength - Ther Ex per flowsheet  Function: - Improved  Education:  Verbal cues for ther ex    Objective     Recumbent bike 7 min   Agility ladder side steps     Heel toe walking  Heel toe walking looking up/down    Hurdle step overs     UBE try    Passive wrist stretching  4 minutes   Standing Marching (high knees) #2 30x   Ham String curl #2 30x   LAQ #2 30x   Balance 1 foot (with head turns) 2x10   ?   Calf raise standing 30x   Seated Calf raise and toe raise 30x each   Flexbar wrist flex/ext  Gr 30x   Flexbar wrist pro/sup  Gr 30x   Ankle ABC 3x   Towel scrunch 30x       Quad Set  30x  ?   Sit to stand 10x2     Treatment:  Ther Exercise per flowsheet    Assessment:  Continue present therapy treatment, patient has significant apprehension and fear of vestibular rehabilitation.   Continues to have significant foot and ankle atrophy along with fear avoidance beliefs.  Poor endurance, only able to tolerate 2.5 minutes on the bike.       Plan of Care:  Continue per Plan of care -  As written continue to progress balance, LE strengthening and general endurance as tolerated.     Thank you for referring this patient to [place], [place]

## 2014-04-03 ENCOUNTER — Ambulatory Visit: Payer: Self-pay

## 2014-04-06 ENCOUNTER — Ambulatory Visit: Payer: Self-pay

## 2014-04-07 ENCOUNTER — Ambulatory Visit: Payer: Self-pay

## 2014-04-07 DIAGNOSIS — M25373 Other instability, unspecified ankle: Secondary | ICD-10-CM

## 2014-04-07 NOTE — Progress Notes (Signed)
Patient is here for a follow up on most recent re-make of bilateral UCBL's. She states that the left is not having issues at this time. The right one, however, feels as though she has too much heel slippage. I have heated and flared the heel cup in to get a better purchase of her foot. She also feels as though it rocks to the outside of her foot. I have sanded the underside of the insert to be more flat to reduce this problem. She feels comfortable while ambulating in the office. She will return in 2 weeks for adjustments or PRN.    Kimber Relic, [place], [place]

## 2014-04-23 ENCOUNTER — Ambulatory Visit: Payer: Self-pay

## 2014-04-27 ENCOUNTER — Ambulatory Visit: Payer: Self-pay

## 2014-05-01 ENCOUNTER — Ambulatory Visit: Payer: Self-pay

## 2014-05-04 ENCOUNTER — Ambulatory Visit: Payer: Self-pay

## 2014-05-04 DIAGNOSIS — S52209B Unspecified fracture of shaft of unspecified ulna, initial encounter for open fracture type I or II: Secondary | ICD-10-CM

## 2014-05-04 DIAGNOSIS — M84376A Stress fracture, unspecified foot, initial encounter for fracture: Secondary | ICD-10-CM

## 2014-05-04 DIAGNOSIS — S52309B Unspecified fracture of shaft of unspecified radius, initial encounter for open fracture type I or II: Secondary | ICD-10-CM

## 2014-05-04 NOTE — Progress Notes (Signed)
Orthopedic Sports/Spine  PT Note    DANYEL TOBEY   [id number]     Diagnosis: B LE/UE weakness, B wrist fractures    Subjective:  Pain Score:  3 right forefoot.   Pain: Worsened, "because I've lost my momentum."   She states she has been performing exercises, but it is not the same as coming to therapy. She continues to have vertigo episodes with turning her head.       Objective:  ROM - Improved, Right, Knee  WFL.   Strength - Ther Ex per flowsheet  Function: - Improved  Education:  Verbal cues for ther ex    Objective     Recumbent bike    Agility ladder side steps     Heel toe walking  Heel toe walking looking up/down    Hurdle step overs     UBE try    Passive wrist stretching     Standing Marching (high knees)    Ham String curl  20x   LAQ #1 30x   Balance 1 foot (with head turns)    Calf raise standing    Seated Calf raise and toe raise    Flexbar wrist flex/ext  Gr 30x   Flexbar wrist pro/sup  Gr 30x   Ankle ABC    Towel scrunch    Balance on airex pad standing with head turns 30x  10x2   Quad Set     Sit to stand 10x3     Treatment:  Ther Exercise per flowsheet    Assessment:  Continue present therapy treatment, patient has significant apprehension and fear of vestibular rehabilitation.   She was limited on time today, not able to complete entire program.       Plan of Care:  Continue per Plan of care -  As written continue to progress balance, LE strengthening and general endurance as tolerated.   Vestibular eval and rehab.     Thank you for referring this patient to [place], [place]

## 2014-05-11 ENCOUNTER — Ambulatory Visit
Admit: 2014-05-11 | Discharge: 2014-05-11 | Disposition: A | Discharge status: Home / Self Care | Payer: Self-pay | Source: Ambulatory Visit | Attending: Obstetrics | Admitting: Obstetrics

## 2014-05-11 ENCOUNTER — Other Ambulatory Visit: Payer: Self-pay | Admitting: Gastroenterology

## 2014-05-13 LAB — GYN CYTOLOGY

## 2014-05-15 ENCOUNTER — Other Ambulatory Visit: Payer: Self-pay | Admitting: Interventional Cardiology

## 2014-05-18 ENCOUNTER — Ambulatory Visit: Payer: Self-pay

## 2014-05-19 ENCOUNTER — Ambulatory Visit
Admit: 2014-05-19 | Discharge: 2014-05-19 | Disposition: A | Discharge status: Home / Self Care | Payer: Self-pay | Source: Ambulatory Visit | Attending: Primary Care | Admitting: Primary Care

## 2014-05-19 DIAGNOSIS — E785 Hyperlipidemia, unspecified: Secondary | ICD-10-CM

## 2014-05-19 DIAGNOSIS — G40909 Epilepsy, unspecified, not intractable, without status epilepticus: Secondary | ICD-10-CM

## 2014-05-19 DIAGNOSIS — E559 Vitamin D deficiency, unspecified: Secondary | ICD-10-CM

## 2014-05-19 DIAGNOSIS — E039 Hypothyroidism, unspecified: Secondary | ICD-10-CM

## 2014-05-19 LAB — COMPREHENSIVE METABOLIC PANEL
ALT: 22 U/L (ref 0–35)
AST: 15 U/L (ref 0–35)
Albumin: 4.8 g/dL (ref 3.5–5.2)
Alk Phos: 66 U/L (ref 35–105)
Anion Gap: 14 (ref 7–16)
Bilirubin,Total: 0.3 mg/dL (ref 0.0–1.2)
CO2: 26 mmol/L (ref 20–28)
Calcium: 8.8 mg/dL (ref 8.6–10.2)
Chloride: 100 mmol/L (ref 96–108)
Creatinine: 0.7 mg/dL (ref 0.51–0.95)
GFR,Black: 109 *
GFR,Caucasian: 94 *
Glucose: 85 mg/dL (ref 60–99)
Lab: 18 mg/dL (ref 6–20)
Potassium: 4.4 mmol/L (ref 3.3–5.1)
Sodium: 140 mmol/L (ref 133–145)
Total Protein: 7.2 g/dL (ref 6.3–7.7)

## 2014-05-19 LAB — CBC
Hematocrit: 42 % (ref 34–45)
Hemoglobin: 13 g/dL (ref 11.2–15.7)
MCH: 31 pg/cell (ref 26–32)
MCHC: 31 g/dL — ABNORMAL LOW (ref 32–36)
MCV: 100 fL — ABNORMAL HIGH (ref 79–95)
Platelets: 245 10*3/uL (ref 160–370)
RBC: 4.2 MIL/uL (ref 3.9–5.2)
RDW: 13.8 % (ref 11.7–14.4)
WBC: 5.6 10*3/uL (ref 4.0–10.0)

## 2014-05-19 LAB — LIPID PANEL
Chol/HDL Ratio: 1.8
Cholesterol: 217 mg/dL — AB
HDL: 118 mg/dL
LDL Calculated: 88 mg/dL
Non HDL Cholesterol: 99 mg/dL
Triglycerides: 55 mg/dL

## 2014-05-19 LAB — CK: CK: 87 U/L (ref 34–145)

## 2014-05-19 LAB — MULTIPLE ORDERING DOCS

## 2014-05-19 LAB — TSH: TSH: 2.19 u[IU]/mL (ref 0.27–4.20)

## 2014-05-21 DIAGNOSIS — S52309B Unspecified fracture of shaft of unspecified radius, initial encounter for open fracture type I or II: Secondary | ICD-10-CM

## 2014-05-21 DIAGNOSIS — S52209B Unspecified fracture of shaft of unspecified ulna, initial encounter for open fracture type I or II: Secondary | ICD-10-CM

## 2014-05-21 DIAGNOSIS — M549 Dorsalgia, unspecified: Secondary | ICD-10-CM

## 2014-05-21 DIAGNOSIS — M84376A Stress fracture, unspecified foot, initial encounter for fracture: Secondary | ICD-10-CM

## 2014-05-21 LAB — VITAMIN D
25-OH VIT D2: <4 ng/mL
25-OH VIT D3: 52 ng/mL
25-OH Vit Total: 52 ng/mL (ref 30–60)

## 2014-05-21 LAB — CARBAMAZEPINE, FREE & TOTAL
Carbamazepine, Free: 21.6 % (ref 8.0–35.0)
Carbamazepine,Free: 2.2 ug/mL (ref 1.0–3.0)
Carbamazepine,Total: 10.2 ug/mL (ref 4.0–12.0)

## 2014-05-21 NOTE — Progress Notes (Signed)
Sports and Spine Rehabilitation  Discharge Summary      [name]  [id number]  05/21/2014    Diagnosis: B LE/UE weakness, B wrist fractures     SUMMARY OF TREATMENTS:    Attendance:  Fair    Compliance:  Good     The treatment(s) included:  Home program instruction, Therapeutic exercise (ROM/flexibility/strength), Progressive Resistive Exercises, Functional progression, Therapeutic activities dynamic stabilization, Manual therapy    Treatment Goals:  Achieved  Range of Motion/Flexibility:  Achieved  Strength/Motor Performance:  Achieved  Functional Recovery:  Achieved  Pain Control:  Achieved  Home Program:  Achieved  Other:  Patient called on 05/21/14 to left a voicemail that said she was doing well and planned to continue exercises at home.     REASON FOR DISCHARGE:  Goals Achieved -  Primary Goals (return to function)    Prognosis at time of discharge:  good  Comments:  Returned patient's call, left message.    Discharge planning:  Discussion regarding the maintenance of appropriate exercise and activity as part of a healthy lifestyle.  Optional utilization of [facility]) for transition and/or additional exercise/activity support    Thank you for the referral of this patient to [facility].    Arlis Porta, PT

## 2014-05-25 ENCOUNTER — Ambulatory Visit: Payer: Self-pay

## 2014-06-01 ENCOUNTER — Ambulatory Visit: Payer: Self-pay

## 2014-06-08 ENCOUNTER — Ambulatory Visit: Payer: Self-pay

## 2014-06-10 ENCOUNTER — Encounter: Payer: Self-pay | Admitting: Interventional Cardiology

## 2014-06-15 ENCOUNTER — Ambulatory Visit: Payer: Self-pay

## 2014-06-23 ENCOUNTER — Encounter: Payer: Self-pay | Admitting: Gastroenterology

## 2014-07-07 ENCOUNTER — Other Ambulatory Visit: Payer: Self-pay | Admitting: Interventional Cardiology

## 2014-07-08 ENCOUNTER — Other Ambulatory Visit: Payer: Self-pay | Admitting: Neurology

## 2014-07-08 NOTE — Telephone Encounter (Signed)
Pharmacy needs clarification on script refill if you could please call them. Also pt is requesting a 90 day supply.

## 2014-07-12 MED ORDER — CARBAMAZEPINE 100 MG PO TB12 *I*
100.0000 mg | ORAL_TABLET | Freq: Two times a day (BID) | ORAL | Status: DC
Start: 2014-07-12 — End: 2016-03-14

## 2014-07-13 ENCOUNTER — Other Ambulatory Visit: Payer: Self-pay | Admitting: Neurology

## 2014-07-13 NOTE — Telephone Encounter (Signed)
Patient is out of the Carbamazepine 200 mg.  She takes 2 200 mg tablets 2 x per day. Please refill at [facility] listed.

## 2014-07-17 NOTE — Telephone Encounter (Signed)
[facility] now calling for refill, informed them that patient called and [facility].

## 2014-07-21 ENCOUNTER — Ambulatory Visit: Payer: Self-pay | Admitting: Neurology

## 2014-07-21 ENCOUNTER — Encounter: Payer: Self-pay | Admitting: Neurology

## 2014-07-21 VITALS — BP 128/62 | HR 74 | Ht 63.0 in | Wt 118.0 lb

## 2014-07-21 DIAGNOSIS — R569 Unspecified convulsions: Secondary | ICD-10-CM

## 2014-07-21 MED ORDER — TEGRETOL XR 200 MG PO TB12
ORAL_TABLET | ORAL | Status: DC
Start: 2014-07-21 — End: 2014-08-07

## 2014-07-21 NOTE — Progress Notes (Signed)
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 She has had no seizures and overall has been doing well. Still with fatigue at times. Poor stamina overall.    She describes 3 issues from her stroke that have been limiting in her lifestyle: 1) decreased motor planning with impaired writing (letters mostly); 2) decreased calculations by hand; 3) poor screen tolerance on computer or TV (too much movement causes her discomfort).    Would like to hold on switching to lamictal.    Her 16 point ROS notable for tremor, tinnitus, heartburn urinary leakage, neck pain.    Exam:  Filed Vitals:    07/21/14 0945   BP: 128/62   Pulse: 74   Height: 1.6 m (5\' 3" )   Weight: 53.524 kg (118 lb)     Alert, oriented, fluent. Comprehension intact, affect appropriate.    Full versions without nystagmus, VFF. Symmetric facies. Good power with mild sustension tremor.  Gait: wide based, slow; tandem deferred.    Impression: Stable overall. Continue CMZ, last levels in excellent range.  Follow up in 6 months.

## 2014-08-04 ENCOUNTER — Encounter: Payer: Self-pay | Admitting: Primary Care

## 2014-08-04 ENCOUNTER — Ambulatory Visit: Payer: Self-pay | Admitting: Primary Care

## 2014-08-04 VITALS — BP 110/62 | HR 72 | Resp 16 | Ht 63.0 in | Wt 120.0 lb

## 2014-08-04 DIAGNOSIS — G40909 Epilepsy, unspecified, not intractable, without status epilepticus: Secondary | ICD-10-CM

## 2014-08-04 DIAGNOSIS — Z23 Encounter for immunization: Secondary | ICD-10-CM

## 2014-08-04 DIAGNOSIS — E034 Atrophy of thyroid (acquired): Secondary | ICD-10-CM

## 2014-08-04 DIAGNOSIS — E559 Vitamin D deficiency, unspecified: Secondary | ICD-10-CM

## 2014-08-04 DIAGNOSIS — E785 Hyperlipidemia, unspecified: Secondary | ICD-10-CM

## 2014-08-04 MED ORDER — RABEPRAZOLE SODIUM 20 MG PO TBEC *I*
20.0000 mg | DELAYED_RELEASE_TABLET | Freq: Two times a day (BID) | ORAL | Status: DC
Start: 2014-08-04 — End: 2014-09-08

## 2014-08-04 MED ORDER — AMLODIPINE BESYLATE 2.5 MG PO TABS *I*
2.5000 mg | ORAL_TABLET | Freq: Every day | ORAL | Status: AC
Start: 2014-08-04 — End: 2015-01-31

## 2014-08-04 NOTE — Progress Notes (Signed)
Reason for Visit: Follow-up    History of seizure-Remains on carbamazepine, continues to feel a bit groggy,  But  is not sure that she wants to make the change to Lamictal as she certainly could be groggy on that medication as well and she will be unable to drive while the changes taking place    Flu shot-Would like to start the 1/2 dose, two week follow up for the second 1/2 dose     GERD--she tries to resist using the second aciphex.  Will trial adding this to see if that might improve her symptoms.  She does feel that stress is playing a role in her current flare of reflux.  She has been afraid to increase the AcipHex because of her osteoporosis.    Hyperlipidemia-remains on Lipitor.  Tolerating this well.  Denies myalgia.  Will be due for repeat on her lipid panel in one month.  Will trial fish oil, does not eat fish.    HTN--reduce amlodipine 2.5mg .  Blood pressure is a bit low today.  We mutually agreed that perhaps this could be playing into her grogginess as well and we should reduce her antihypertensive.    She denies chest pain, shortness of breath.  Does note an increase in her symptoms of reflux.  Denies difficulty swallowing..  Feels that she has been under a great deal of stress relating to her son.  Many of her friends feel that she and her husband are not setting Melida Quitter limits with her son and this is why he has not made more strides towards a career choice.  She feels that he does not do well with limits and just pushes them when they are set.  She feels discouraged as she does not feel that her decisions are being validated or supported by their friends or family.  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    Ringing In The Ears (Tinnitus) 388.30    GERD (gastroesophageal reflux disease) 530.81    Contusion of foot 924.20    Pain in joint, ankle and foot 719.47    Weakness of foot 734     Current Outpatient Prescriptions   Medication    TEGRETOL-XR 200 MG 12 hr tablet    carBAMazepine (CARBATROL) 100 MG 12 hr capsule    desonide (DESOWEN) 0.05 % cream    carBAMazepine (TEGRETOL XR) 100 MG 12 hr tablet    amLODIPine (NORVASC) 5 MG tablet    atorvastatin (LIPITOR) 10 MG tablet    ipratropium (ATROVENT HFA) 17 MCG/ACT inhaler    levothyroxine (SYNTHROID, LEVOTHROID) 25 MCG tablet    darifenacin (ENABLEX) 15 MG 24 hr tablet    azelastine (ASTELIN) 137 MCG/SPRAY nasal spray    fluticasone (FLONASE) 50 MCG/ACT nasal spray    Calcium Carb-Cholecalciferol 600-200 MG-UNIT TABS    lamoTRIgine (LAMICTAL) 25 MG tablet    GuaiFENesin (MUCINEX PO)    Denosumab (PROLIA SC)    loratadine (CLARITIN) 10 MG tablet    Multiple Vitamin (MULTIVITAMIN) per  tablet    metronidazole (NORITATE) 1 % cream    cholecalciferol (VITAMIN D) 1000 UNITS capsule    rabeprazole (ACIPHEX) 20 MG tablet     No current facility-administered medications for this visit.       Medication list reviewed and updated,  changes were made today    Exam: BP 110/66 mmHg   Pulse 72   Resp 16   Ht 1.6 m (5\' 3" )   Wt 54.432 kg (120 lb)   BMI 21.26 kg/m2   SpO2 100%  Well-nourished alert and conversant 58 year old lady in no acute distress.  Lungs are clear to auscultation bilaterally.  Cardiac exam reveals a normal S1, S2 with a regular rate and rhythm no rubs murmurs or gallops.  Abdomen is soft and nontender.  Extremities are without edema.  2+ DP and PT pulses are noted.    A/P:  1.  Hypertension-blood pressure is a bit on the low side and I do wonder if this is playing a role in her fatigue.  Will reduce amlodipine to 2.5 mg  daily.  2.  Hyperlipidemia-check fasting lipid profile, liver function tests and CK enzyme and adjust the atorvastatin accordingly.patient will add omega-3 fatty acids to her regimen as well.  3.  Gastroesophageal reflux disease-patient agrees to try taking the AcipHex twice daily for at least a short period of time to see if we can reduce her symptoms.  4.half dose flu shot was administered today.  Patient is to return for a blood pressure followup in one month and do fasting labs prior so weak and recheck her lipids.  We will also check her carbamazepine level at that time

## 2014-08-04 NOTE — Patient Instructions (Addendum)
Increase the aciphex to two daily  Reduce amlodipine to half tab daily  Trial of omega three fish oil 1000mg  daily  Go for fasting labs prior to one month follow up

## 2014-08-05 ENCOUNTER — Telehealth: Payer: Self-pay | Admitting: Interventional Cardiology

## 2014-08-05 NOTE — Telephone Encounter (Signed)
New Message  Pt called states that she is experiencing a change in her stamina and BP lower than normal.. She wants her med's re- evaluated. Pt also states that she is dizzy at times. Please call back to discuss. Made appt with Ariel Simpson on 9/23.

## 2014-08-05 NOTE — Telephone Encounter (Signed)
returned pt call. lmtcb 

## 2014-08-07 ENCOUNTER — Other Ambulatory Visit: Payer: Self-pay

## 2014-08-10 MED ORDER — TEGRETOL XR 200 MG PO TB12
ORAL_TABLET | ORAL | Status: DC
Start: 2014-08-10 — End: 2015-02-03

## 2014-08-12 ENCOUNTER — Encounter: Payer: Self-pay | Admitting: Nurse Practitioner

## 2014-08-12 ENCOUNTER — Ambulatory Visit (INDEPENDENT_AMBULATORY_CARE_PROVIDER_SITE_OTHER): Payer: BC Managed Care – PPO | Admitting: Nurse Practitioner

## 2014-08-12 VITALS — BP 110/78 | HR 58 | Ht 66.0 in | Wt 158.0 lb

## 2014-08-12 DIAGNOSIS — Z9889 Other specified postprocedural states: Secondary | ICD-10-CM

## 2014-08-12 DIAGNOSIS — I4891 Unspecified atrial fibrillation: Secondary | ICD-10-CM

## 2014-08-12 DIAGNOSIS — I10 Essential (primary) hypertension: Secondary | ICD-10-CM

## 2014-08-12 DIAGNOSIS — I480 Paroxysmal atrial fibrillation: Secondary | ICD-10-CM

## 2014-08-12 LAB — CBC
HCT: 33 % — ABNORMAL LOW (ref 36.0–46.0)
Hemoglobin: 11.4 g/dL — ABNORMAL LOW (ref 12.0–15.0)
MCHC: 34.5 g/dL (ref 30.0–36.0)
MCV: 93.1 fl (ref 78.0–100.0)
Platelets: 242 10*3/uL (ref 150.0–400.0)
RBC: 3.55 Mil/uL — ABNORMAL LOW (ref 3.87–5.11)
RDW: 15.1 % (ref 11.5–15.5)
WBC: 6.4 10*3/uL (ref 4.0–10.5)

## 2014-08-12 LAB — BASIC METABOLIC PANEL
BUN: 17 mg/dL (ref 6–23)
CO2: 31 mEq/L (ref 19–32)
Calcium: 10.2 mg/dL (ref 8.4–10.5)
Chloride: 99 mEq/L (ref 96–112)
Creatinine, Ser: 1.1 mg/dL (ref 0.4–1.2)
GFR: 52.62 mL/min — ABNORMAL LOW (ref >60.00)
Glucose, Bld: 87 mg/dL (ref 70–99)
Potassium: 4.2 mEq/L (ref 3.5–5.1)
Sodium: 136 mEq/L (ref 135–145)

## 2014-08-12 MED ORDER — METOPROLOL SUCCINATE ER 100 MG PO TB24: 50.0000 mg | ORAL_TABLET | Freq: Every day | ORAL | Status: DC

## 2014-08-12 MED ORDER — METOPROLOL SUCCINATE ER 25 MG PO TB24: 25.0000 mg | ORAL_TABLET | Freq: Every day | ORAL | Status: DC

## 2014-08-12 MED ORDER — SPIRONOLACTONE-HCTZ 25-25 MG PO TABS: ORAL_TABLET | ORAL | Status: DC

## 2014-08-12 NOTE — Progress Notes (Signed)
Ariel Simpson Date of Birth: 08-29-1956 Medical Record #[id number]  History of Present Illness: Ariel Simpson is seen back today for a work in visit. Seen for Dr. Tamala Julian. She is a 58 year old female with prior MV repair, diastolic HTN and pAF with prior MAZE at the time of her MV repair back in 2014.   Last seen here in April - felt to be doing ok.  Called last week with a "change in stamina and lower BP" - wanted medicines reevaluated. Thus added to my schedule.  Comes in today. Here alone. She was doing well up until the middle of the summer. Has then started noticing that she "just can't do" as she previously could. Starting checking her BP and HR - both are low. She has been a little dizzy. No real palpitations. Less endurance noted. She is not sleeping as well. Wonders if she could be on less Toprol - used to take 50 mg. She has successfully lost weight and continues to exercise.   Current Outpatient Prescriptions  Medication Sig Dispense Refill  . aspirin 325 MG tablet Take 325 mg by mouth daily.      Marland Kitchen atorvastatin (LIPITOR) 20 MG tablet Take 20 mg by mouth daily.      . Calcium Carbonate-Vitamin D (CALCIUM + D PO) Take 2 capsules by mouth daily.      Marland Kitchen GENTLE LAXATIVE 5 MG EC tablet       . GLUCOSA-CHONDR-NA CHONDR-MSM PO Take 2 capsules by mouth daily.      . metoprolol succinate (TOPROL-XL) 100 MG 24 hr tablet TAKE 1 TABLET DAILY  90 tablet  1  . Omega-3 Fatty Acids (FISH OIL PO) Take 2 capsules by mouth daily.      Marland Kitchen spironolactone-hydrochlorothiazide (ALDACTAZIDE) 25-25 MG per tablet TAKE 1 TABLET DAILY  90 tablet  1  . zolpidem (AMBIEN) 5 MG tablet        No current facility-administered medications for this visit.    No Known Allergies  Past Medical History  Diagnosis Date  . Mitral valve prolapse   . High cholesterol   . Heart murmur   . Severe mitral regurgitation 03/03/2013    Acute on chronic with acute heart failure   . Mitral regurgitation   . Paroxysmal  supraventricular tachycardia   . Paroxysmal atrial fibrillation 03/11/2013  . Shortness of breath   . PONV (postoperative nausea and vomiting)     Dizziness and Nausea  . CHF (congestive heart failure)   . Hypertension   . S/P mitral valve repair 03/18/2013    Complex valvuloplasty including triangular resection of posterior leaflet, artificial Gore-tex neocord placement x4 and 21mm Sorin Memo 3D ring annuloplasty via right mini thoracotomy  . S/P Maze operation for atrial fibrillation 03/18/2013    Complete bilateral atrial lesion set using cryothermy and biopolar radiofrequency ablation with oversewing of LA appendage via right mini thoracotomy    Past Surgical History  Procedure Laterality Date  . Cardiac catheterization    . Wisdom tooth extraction    . Vericose veins    . Abdominal hysterectomy      hernia repair  . Mitral valve repair Right 03/18/2013    Procedure: MINIMALLY INVASIVE MITRAL VALVE REPAIR (MVR);  Surgeon: Rexene Alberts, MD;  Location: [facility];  Service: Open Heart Surgery;  Laterality: Right;  . Intraoperative transesophageal echocardiogram N/A 03/18/2013    Procedure: INTRAOPERATIVE TRANSESOPHAGEAL ECHOCARDIOGRAM;  Surgeon: Rexene Alberts, MD;  Location: [facility];  Service:  Open Heart Surgery;  Laterality: N/A;  . Maze N/A 03/18/2013    Procedure: MAZE;  Surgeon: Rexene Alberts, MD;  Location: [facility];  Service: Open Heart Surgery;  Laterality: N/A;    History  Smoking status  . Never Smoker   Smokeless tobacco  . Never Used    History  Alcohol Use  . 1.8 oz/week  . 3 Glasses of wine per week    Comment: occasional    Family History  Problem Relation Age of Onset  . Cancer Mother   . Cancer Father     Review of Systems: The review of systems is per the HPI.  All other systems were reviewed and are negative.  Physical Exam: BP 110/78  Pulse 58  Ht 5\' 6"  (1.676 m)  Wt 158 lb (71.668 kg)  BMI 25.51 kg/m2  SpO2 99% Patient is very pleasant and in no  acute distress. Her weight is down. Skin is warm and dry. Color is normal.  HEENT is unremarkable. Normocephalic/atraumatic. PERRL. Sclera are nonicteric. Neck is supple. No masses. No JVD. Lungs are clear. Cardiac exam shows a regular rate and rhythm. Abdomen is soft. Extremities are without edema. Gait and ROM are intact. No gross neurologic deficits noted.  Wt Readings from Last 3 Encounters:  08/12/14 158 lb (71.668 kg)  03/16/14 164 lb (74.39 kg)  02/24/14 164 lb 12.8 oz (74.753 kg)    LABORATORY DATA/PROCEDURES:  Lab Results  Component Value Date   WBC 8.2 03/21/2013   HGB 8.4* 03/21/2013   HCT 24.2* 03/21/2013   PLT 139* 03/21/2013   GLUCOSE 92 10/10/2013   ALT 39* 03/12/2013   AST 36 03/12/2013   NA 136 10/10/2013   K 4.6 10/10/2013   CL 100 10/10/2013   CREATININE 0.96 10/10/2013   BUN 19 10/10/2013   CO2 28 10/10/2013   TSH 2.043 03/03/2013   INR 1.29 03/23/2013   HGBA1C 5.5 03/18/2013    BNP (last 3 results) No results found for this basename: PROBNP,  in the last 8760 hours  Echo Study Conclusions from April 2015  - Left ventricle: The cavity size was mildly dilated. Wall thickness was normal. Systolic function was normal. The estimated ejection fraction was in the range of 60% to 65%. - Mitral valve: Prior procedures included surgical repair. Mild regurgitation. Valve area by pressure half-time: 2.18cm^2. - Left atrium: The atrium was mildly dilated.   Assessment / Plan: 1. Prior MV repair - last echo from April of 2015 looks good  2. HTN -  BP at home is in the 90's/70's predominantly. HR in the 50's as well. She has lost weight. I have cut her aldactazide in half and cut her Toprol to 75 mg. She will continue to monitor her readings over the next one to 2 weeks and will be in touch if further adjustments need to be made. If she does not improve clinically - would consider event monitor.   3. PAF - with prior MAZE -  She does not feel like she has had recurrent AF.     Will check baseline labs today as well. Medicines cut back as noted.   Patient is agreeable to this plan and will call if any problems develop in the interim.   Burtis Junes, RN, [facility] [address] [place], [place]  [id number] [phone]

## 2014-08-12 NOTE — Patient Instructions (Addendum)
We are cutting your Metoprolol to 75 mg each day - I have sent in RX for 25 mg tablets to take with 1/2 of your 100 mg tablets  Cut your aldactazide in half  Continue to monitor your BP and HR  We will check baseline labs today  Congrats on the weight loss!  Call us if this does not work or we need to make further changes to your medicines.  Call the [place] office at [phone] if you have any questions, problems or concerns.

## 2014-08-13 ENCOUNTER — Other Ambulatory Visit: Payer: Self-pay | Admitting: *Deleted

## 2014-08-13 MED ORDER — METOPROLOL SUCCINATE ER 25 MG PO TB24: 25.0000 mg | ORAL_TABLET | Freq: Every day | ORAL | Status: DC

## 2014-08-17 ENCOUNTER — Ambulatory Visit: Payer: Self-pay

## 2014-08-26 ENCOUNTER — Ambulatory Visit: Payer: Self-pay

## 2014-08-26 DIAGNOSIS — Z23 Encounter for immunization: Secondary | ICD-10-CM

## 2014-09-01 ENCOUNTER — Ambulatory Visit
Admit: 2014-09-01 | Discharge: 2014-09-01 | Disposition: A | Discharge status: Home / Self Care | Payer: Self-pay | Source: Ambulatory Visit | Attending: Primary Care | Admitting: Primary Care

## 2014-09-01 DIAGNOSIS — E039 Hypothyroidism, unspecified: Secondary | ICD-10-CM

## 2014-09-01 DIAGNOSIS — G40909 Epilepsy, unspecified, not intractable, without status epilepticus: Secondary | ICD-10-CM

## 2014-09-01 DIAGNOSIS — E559 Vitamin D deficiency, unspecified: Secondary | ICD-10-CM

## 2014-09-01 DIAGNOSIS — E034 Atrophy of thyroid (acquired): Secondary | ICD-10-CM

## 2014-09-01 DIAGNOSIS — E785 Hyperlipidemia, unspecified: Secondary | ICD-10-CM

## 2014-09-01 LAB — CK: CK: 75 U/L (ref 34–145)

## 2014-09-01 LAB — CBC
Hematocrit: 37 % (ref 34–45)
Hemoglobin: 12.2 g/dL (ref 11.2–15.7)
MCH: 32 pg/cell (ref 26–32)
MCHC: 33 g/dL (ref 32–36)
MCV: 98 fL — ABNORMAL HIGH (ref 79–95)
Platelets: 245 10*3/uL (ref 160–370)
RBC: 3.8 MIL/uL — ABNORMAL LOW (ref 3.9–5.2)
RDW: 13.5 % (ref 11.7–14.4)
WBC: 6 10*3/uL (ref 4.0–10.0)

## 2014-09-01 LAB — COMPREHENSIVE METABOLIC PANEL
ALT: 22 U/L (ref 0–35)
AST: 19 U/L (ref 0–35)
Albumin: 4.6 g/dL (ref 3.5–5.2)
Alk Phos: 72 U/L (ref 35–105)
Anion Gap: 13 (ref 7–16)
Bilirubin,Total: 0.3 mg/dL (ref 0.0–1.2)
CO2: 28 mmol/L (ref 20–28)
Calcium: 8.9 mg/dL (ref 8.6–10.2)
Chloride: 99 mmol/L (ref 96–108)
Creatinine: 0.72 mg/dL (ref 0.51–0.95)
GFR,Black: 105 *
GFR,Caucasian: 91 *
Glucose: 88 mg/dL (ref 60–99)
Lab: 20 mg/dL (ref 6–20)
Potassium: 4.5 mmol/L (ref 3.3–5.1)
Sodium: 140 mmol/L (ref 133–145)
Total Protein: 7 g/dL (ref 6.3–7.7)

## 2014-09-01 LAB — LIPID PANEL
Chol/HDL Ratio: 2
Cholesterol: 220 mg/dL — AB
HDL: 111 mg/dL
LDL Calculated: 93 mg/dL
Non HDL Cholesterol: 109 mg/dL
Triglycerides: 82 mg/dL

## 2014-09-01 LAB — TSH: TSH: 1.84 u[IU]/mL (ref 0.27–4.20)

## 2014-09-03 LAB — CARBAMAZEPINE, FREE & TOTAL
Carbamazepine, Free: 23.3 % (ref 8.0–35.0)
Carbamazepine,Free: 2.1 ug/mL (ref 1.0–3.0)
Carbamazepine,Total: 9 ug/mL (ref 4.0–12.0)

## 2014-09-07 LAB — VITAMIN D
25-OH VIT D2: <4 ng/mL
25-OH VIT D3: 46 ng/mL
25-OH Vit Total: 46 ng/mL (ref 30–60)

## 2014-09-08 ENCOUNTER — Ambulatory Visit: Payer: Self-pay | Admitting: Primary Care

## 2014-09-08 ENCOUNTER — Encounter: Payer: Self-pay | Admitting: Primary Care

## 2014-09-08 VITALS — BP 112/68 | HR 72 | Ht 63.0 in | Wt 120.0 lb

## 2014-09-08 DIAGNOSIS — E785 Hyperlipidemia, unspecified: Secondary | ICD-10-CM

## 2014-09-08 DIAGNOSIS — I10 Essential (primary) hypertension: Secondary | ICD-10-CM

## 2014-09-08 DIAGNOSIS — Z5181 Encounter for therapeutic drug level monitoring: Secondary | ICD-10-CM

## 2014-09-08 DIAGNOSIS — E034 Atrophy of thyroid (acquired): Secondary | ICD-10-CM

## 2014-09-08 DIAGNOSIS — E559 Vitamin D deficiency, unspecified: Secondary | ICD-10-CM

## 2014-09-08 MED ORDER — RABEPRAZOLE SODIUM 20 MG PO TBEC *I*
20.0000 mg | DELAYED_RELEASE_TABLET | Freq: Every day | ORAL | Status: DC
Start: 2014-09-08 — End: 2015-11-01

## 2014-09-08 NOTE — Progress Notes (Signed)
Internal Medicine Outpatient Note       CC: follow up hypertension    SUBJECTIVE     [name] is a 23yoF who is being seen for follow up after antihypertensive medication change.  She had been taking amlodipine 5mg , recent BP measurements were WNL so her dose was decreased by half.  She has been taking half dose daily with no complaints of headache or blurred vision, no dizziness or sx of orthopnea upon standing.  She feels that she has had more energy recently after decreasing this dose.  She is otherwise well, without new complaint.       ROS is negative for any weight change, no URI symptoms, no change in bowel or bladder habits.    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    Ringing In The Ears (Tinnitus) H93.19    GERD (gastroesophageal reflux disease) K21.9    Contusion of foot S90.30XA    Pain in joint, ankle and foot M25.579    Weakness of foot M21.40         OBJECTIVE     Vitals:   Filed Vitals:    09/08/14 0905   BP: 110/68   Pulse: 72   Height: 1.6 m (5\' 3" )   Weight: 54.432 kg (120 lb)       General:  Frail, appears older than stated age, somewhat flat or blunted affect  HEENT: TM clear BL, OP non edematous, non erythematous, no cervical LAD, no thyromegaly  Cardiac: rrr, normal s1s2  Pulm: CTA BL  GI: nontender to palpation, normal borborygmi throughout  Ext: WWP, no edema        Lab results: 09/01/14  0835 05/19/14  0845 01/27/14  0900   SODIUM 140 140 142   POTASSIUM 4.5 4.4 4.2   CHLORIDE 99 100 103   CO2 28 26 26    UN 20 18 15    CREATININE 0.72 0.70 0.64   GFR,CAUCASIAN 91 94 97   GFR,BLACK 105 109 112    GLUCOSE 88 85 89   CALCIUM 8.9 8.8 8.6         Lab results: 09/01/14  0835 05/19/14  0845 01/27/14  0900   WBC 6.0 5.6 5.3   HEMOGLOBIN 12.2 13.0 11.9   HEMATOCRIT 37 42 37   RBC 3.8* 4.2 3.8*   PLATELETS 245 245 247             Lab results: 09/01/14  0835 05/19/14  0845 01/27/14  0900   CHOLESTEROL 220* 217* 196   HDL 111 118 101   LDL CALCULATED 93 88 77   TRIGLYCERIDES 82 55 88   NON HDL CHOLESTEROL 109 99 95   CHOL/HDL RATIO 2.0 1.8 1.9           ASSESSMENT     Natale's BP has been consistently been low but stable over the last few visits, even after decreasing her dose by half at last visit.  She is at high risk for falls given physical  decline and instability.  Will have to balance her hypertension management with the risk of fall should her BP continue to remain low.  WHile she does not report sx of orthostatis I feel it is best to be cautious in this case and further reduce her amlodipine dose by half.  I suspect that if BP remains within normal range, we can discontinue it at her next visit.    We also discussed her lipid panel, which shows an elevated total cholesterol but HDL that is above the normal range so no change to her statin therapy today.    She is up to date with influenza vaccine, but reports that unfortunately her husband has been ill after receiving his.    She is still stressed about her son and his decision not to apply to college.        PLAN     Plan to see again for physical in the spring, with the following blood  tests prior;          ICD-10-CM ICD-9-CM    1. Hypothyroidism due to acquired atrophy of thyroid E03.8 244.8 TSH    E03.4 246.8    2. Hyperlipidemia E78.5 272.4 Comprehensive metabolic panel      Lipid add Rfx to Drt LDL if Trig >400      CK   3. Medication monitoring encounter Z51.81 V58.83 CBC      Carbamazepine, free & total   4. Vitamin D deficiency E55.9 268.9 Vitamin D       Current Outpatient Prescriptions   Medication Sig Note    TEGRETOL-XR 200 MG 12 hr tablet TAKE  2 TABLETS TWICE DAILY     RABEprazole (ACIPHEX) 20 MG tablet Take 1 tablet (20 mg total) by mouth 2 times daily     amLODIPine (NORVASC) 2.5 MG tablet Take 1 tablet (2.5 mg total) by mouth daily     carBAMazepine (CARBATROL) 100 MG 12 hr capsule  07/21/2014: Received from: External Pharmacy    desonide (DESOWEN) 0.05 % cream  07/21/2014: Received from: External Pharmacy    carBAMazepine (TEGRETOL XR) 100 MG 12 hr tablet Take 1 tablet (100 mg total) by mouth 2 times daily (with meals)   Along with Carbamazepine 200 mg tabs     atorvastatin (LIPITOR) 10 MG tablet Take 1 tablet (10 mg total) by mouth daily (with dinner)     ipratropium (ATROVENT HFA) 17 MCG/ACT inhaler Inhale 2 puffs into the lungs 4 times daily   Shake well before each use     levothyroxine (SYNTHROID, LEVOTHROID) 25 MCG tablet Take 1 tablet (25 mcg total) by mouth daily (before breakfast)     darifenacin (ENABLEX) 15 MG 24 hr tablet Take 1 tablet (15 mg total) by mouth daily     azelastine (ASTELIN) 137 MCG/SPRAY nasal spray 2 sprays by Nasal route 4 times daily   AS DIRECTED     fluticasone (FLONASE) 50 MCG/ACT nasal spray 2 sprays by Nasal route daily     Calcium Carb-Cholecalciferol 600-200 MG-UNIT TABS Take 1 tablet by mouth 3 times daily     lamoTRIgine (LAMICTAL) 25 MG tablet Take 1 tablet (25 mg total) by mouth daily 01/30/2014: Has not started yet     GuaiFENesin (MUCINEX PO) Take 1 tablet by mouth 3 times daily     Denosumab (PROLIA SC) Inject 1 Device into the skin every 6 months     loratadine (CLARITIN) 10 MG tablet Take 10 mg by mouth daily  Multiple Vitamin (MULTIVITAMIN) per tablet Take 1 tablet by mouth daily     metronidazole (NORITATE) 1 % cream Apply topically daily     cholecalciferol (VITAMIN D) 1000 UNITS capsule Take 1,000 Units by mouth 3 times daily      No current facility-administered medications for this visit.       Signed by: Delia Chimes, MD 09/08/2014 9:14 AM       Patient was seen and examined with Dr.  Belva Crome.  I agree with the history, physical and assessment and plan as documented above.  Hyperlipidemia is well-controlled on the atorvastatin.  Patient tolerating this well.  No changes are made today.  Hypothyroidism-well-controlled on levothyroxin.  TSH 1.84.  Hypertension-blood pressure remains low and given that there was some marginal improvement in her fatigue with the last reduction in her amlodipine dose, will further reduce to 1.25 mg daily.  Plan for blood pressure check in another 4-6 weeks

## 2014-09-08 NOTE — Patient Instructions (Signed)
Split the 2.5mg  dose in half.

## 2014-10-02 ENCOUNTER — Telehealth: Payer: Self-pay | Admitting: Neurology

## 2014-10-02 NOTE — Telephone Encounter (Signed)
Patient calling for a letter excusing her from jury duty

## 2014-10-05 NOTE — Telephone Encounter (Signed)
Patient calling to thank you for letter for jury duty.

## 2014-10-21 ENCOUNTER — Telehealth: Payer: Self-pay

## 2014-10-23 ENCOUNTER — Other Ambulatory Visit: Payer: Self-pay | Admitting: Primary Care

## 2014-10-28 ENCOUNTER — Ambulatory Visit: Payer: Self-pay

## 2014-10-28 DIAGNOSIS — M25373 Other instability, unspecified ankle: Secondary | ICD-10-CM

## 2014-10-28 DIAGNOSIS — M2140 Flat foot [pes planus] (acquired), unspecified foot: Secondary | ICD-10-CM

## 2014-10-28 NOTE — Progress Notes (Signed)
Patient seen for adjustment to right UCBL. She has a lateral calcaneal prominence that was getting too much pressure, causing discomfort. I heated and flared this area to relieve the pressure. She was comfortable after this adjustment. She will f/u PRN.    Kimber Relic, [place], [place]

## 2014-10-29 ENCOUNTER — Encounter (HOSPITAL_COMMUNITY): Payer: Self-pay | Admitting: Interventional Cardiology

## 2014-11-30 ENCOUNTER — Ambulatory Visit
Admit: 2014-11-30 | Discharge: 2014-11-30 | Disposition: A | Discharge status: Home / Self Care | Payer: Self-pay | Source: Ambulatory Visit | Attending: Primary Care | Admitting: Primary Care

## 2014-11-30 DIAGNOSIS — E785 Hyperlipidemia, unspecified: Secondary | ICD-10-CM

## 2014-11-30 DIAGNOSIS — E559 Vitamin D deficiency, unspecified: Secondary | ICD-10-CM

## 2014-11-30 DIAGNOSIS — E034 Atrophy of thyroid (acquired): Secondary | ICD-10-CM

## 2014-11-30 DIAGNOSIS — G40909 Epilepsy, unspecified, not intractable, without status epilepticus: Secondary | ICD-10-CM

## 2014-11-30 DIAGNOSIS — Z5181 Encounter for therapeutic drug level monitoring: Secondary | ICD-10-CM

## 2014-11-30 LAB — COMPREHENSIVE METABOLIC PANEL
ALT: 22 U/L (ref 0–35)
AST: 21 U/L (ref 0–35)
Albumin: 4.5 g/dL (ref 3.5–5.2)
Alk Phos: 60 U/L (ref 35–105)
Anion Gap: 14 (ref 7–16)
Bilirubin,Total: 0.3 mg/dL (ref 0.0–1.2)
CO2: 28 mmol/L (ref 20–28)
Calcium: 9.1 mg/dL (ref 8.6–10.2)
Chloride: 102 mmol/L (ref 96–108)
Creatinine: 0.71 mg/dL (ref 0.51–0.95)
GFR,Black: 106 *
GFR,Caucasian: 92 *
Glucose: 105 mg/dL — ABNORMAL HIGH (ref 60–99)
Lab: 21 mg/dL — ABNORMAL HIGH (ref 6–20)
Potassium: 4.1 mmol/L (ref 3.3–5.1)
Sodium: 144 mmol/L (ref 133–145)
Total Protein: 6.8 g/dL (ref 6.3–7.7)

## 2014-11-30 LAB — LIPID PANEL
Chol/HDL Ratio: 1.9
Cholesterol: 208 mg/dL — AB
HDL: 109 mg/dL
LDL Calculated: 83 mg/dL
Non HDL Cholesterol: 99 mg/dL
Triglycerides: 80 mg/dL

## 2014-11-30 LAB — CBC
Hematocrit: 40 % (ref 34–45)
Hemoglobin: 12.9 g/dL (ref 11.2–15.7)
MCH: 33 pg/cell — ABNORMAL HIGH (ref 26–32)
MCHC: 32 g/dL (ref 32–36)
MCV: 100 fL — ABNORMAL HIGH (ref 79–95)
Platelets: 233 10*3/uL (ref 160–370)
RBC: 4 MIL/uL (ref 3.9–5.2)
RDW: 13.5 % (ref 11.7–14.4)
WBC: 5.3 10*3/uL (ref 4.0–10.0)

## 2014-11-30 LAB — MAGNESIUM: Magnesium: 1.7 mEq/L (ref 1.3–2.1)

## 2014-11-30 LAB — TSH: TSH: 2.15 u[IU]/mL (ref 0.27–4.20)

## 2014-11-30 LAB — CK: CK: 65 U/L (ref 34–145)

## 2014-11-30 LAB — MULTIPLE ORDERING DOCS

## 2014-12-02 LAB — VITAMIN D
25-OH VIT D2: <4 ng/mL
25-OH VIT D3: 44 ng/mL
25-OH Vit Total: 44 ng/mL (ref 30–60)

## 2014-12-03 ENCOUNTER — Encounter (HOSPITAL_COMMUNITY): Payer: Self-pay | Admitting: Cardiology

## 2014-12-03 LAB — CARBAMAZEPINE, FREE & TOTAL
Carbamazepine, Free: 18.6 % (ref 8.0–35.0)
Carbamazepine,Free: 1.6 ug/mL (ref 1.0–3.0)
Carbamazepine,Total: 8.6 ug/mL (ref 4.0–12.0)

## 2014-12-15 ENCOUNTER — Ambulatory Visit: Payer: Self-pay | Admitting: Primary Care

## 2014-12-15 ENCOUNTER — Encounter: Payer: Self-pay | Admitting: Primary Care

## 2014-12-15 VITALS — BP 128/78 | HR 80 | Resp 16 | Ht 63.0 in | Wt 120.0 lb

## 2014-12-15 DIAGNOSIS — E034 Atrophy of thyroid (acquired): Secondary | ICD-10-CM

## 2014-12-15 DIAGNOSIS — I10 Essential (primary) hypertension: Secondary | ICD-10-CM

## 2014-12-15 DIAGNOSIS — E785 Hyperlipidemia, unspecified: Secondary | ICD-10-CM

## 2014-12-15 DIAGNOSIS — M810 Age-related osteoporosis without current pathological fracture: Secondary | ICD-10-CM

## 2014-12-15 NOTE — Progress Notes (Addendum)
Reason for Visit: Hypertension    Reviewed labs  Hyperlipidemia--cholesterol remains well controlled on atorvastatin.  CK and LFTs normal. Total cholesterol was 208, triglycerides 80, HDL 109 LDL 83.  She is going to start fish oil.    She is thinking about whether she could see Dr. Cathrine Muster regarding Osteoporosis.   Just had her prolia injection.  Continues to take calcium and vitamin D and has continued to work with physical therapy to get regular weightbearing exercise.calcium, magnesium and vitamin D levels were normal.    History of intracranial AVM and resultant seizure-the patient remains on carbamazepine.  Tolerating this well.     Overactive bladder-patient feels that symptoms are reasonably well controlled on the knee flexed    Hypertension-we've reduced the patient's amlodipine at the last visit due to hypotension.  She has not noted any difference in symptoms of fatigue.  Blood pressure is more supple today      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    Ringing In The Ears (Tinnitus) H93.19    GERD (gastroesophageal reflux disease) K21.9    Contusion of foot S90.30XA    Pain in joint, ankle and foot M25.579    Weakness of foot M21.40     Current Outpatient Prescriptions   Medication    levothyroxine (SYNTHROID, LEVOTHROID) 25 MCG tablet    ENABLEX 15 MG 24 hr tablet    RABEprazole (ACIPHEX) 20 MG tablet    TEGRETOL-XR 200 MG 12 hr tablet    amLODIPine (NORVASC) 2.5 MG tablet    carBAMazepine (CARBATROL) 100 MG 12 hr capsule    desonide (DESOWEN) 0.05 % cream    carBAMazepine (TEGRETOL XR) 100 MG 12  hr tablet    atorvastatin (LIPITOR) 10 MG tablet    darifenacin (ENABLEX) 15 MG 24 hr tablet    azelastine (ASTELIN) 137 MCG/SPRAY nasal spray    fluticasone (FLONASE) 50 MCG/ACT nasal spray    Calcium Carb-Cholecalciferol 600-200 MG-UNIT TABS    lamoTRIgine (LAMICTAL) 25 MG tablet    GuaiFENesin (MUCINEX PO)    Denosumab (PROLIA SC)    loratadine (CLARITIN) 10 MG tablet    Multiple Vitamin (MULTIVITAMIN) per tablet    metronidazole (NORITATE) 1 % cream    cholecalciferol (VITAMIN D) 1000 UNITS capsule    ipratropium (ATROVENT HFA) 17 MCG/ACT inhaler     No current facility-administered medications for this visit.       Medication list reviewed and updated, no changes were made today    Exam: BP 130/82 mmHg   Pulse 80   Resp 16   Ht 1.6 m (5\' 3" )   Wt 54.432 kg (120 lb)   BMI 21.26 kg/m2  Well-nourished alert and conversant 59 year old lady in no acute distress.  Lungs are clear to auscultation bilaterally.  Cardiac exam reveals normal S1, S2 with regular rate and rhythm.  Abdomen is soft and nontender.  Extremities are without edema.  Per patient request, breast exam was completed.  Patient has dense breast tissue but  no masses appreciated.  No axillary the appreciated    A/P:  1.  Hypertension-well-controlled on lower dose of amlodipine, no changes are made today.  2.  Hyperlipidemia-well-controlled on the atorvastatin.  Patient is tolerating this well.  No changes were made today.  3.  Overactive bladder-continue N/A box.  4.  Osteoporosis-patient has recently received her prolila injection, she is to continue calcium, vitamin D and regular weightbearing exercise.she is not due for repeat bone density until next January    [facility] Hypertension Plan    Based on this patients clinical history and according to [place] guidelines target BP goal is: less than 130/80  Based on the patient's last BP of BP: 128/78 mmHg the patient is: at goal  The plan to reach goal   1.  Reviewed pt's understanding of medications  including any barriers to adherence.   2.  Recommended lifestyle modifications: exercise plan and medication compliance   3.  The patient understands their clinical goals and will undertake self-management recommendations including:all     4.  Medication Management: no changes made    5.  Referral to Care Management:: No   6.  Patient Ed/Self-management tools provided: No

## 2014-12-19 ENCOUNTER — Encounter: Payer: Self-pay | Admitting: Primary Care

## 2014-12-19 DIAGNOSIS — E785 Hyperlipidemia, unspecified: Secondary | ICD-10-CM | POA: Insufficient documentation

## 2015-01-18 ENCOUNTER — Other Ambulatory Visit: Payer: Self-pay | Admitting: *Deleted

## 2015-01-18 MED ORDER — SPIRONOLACTONE-HCTZ 25-25 MG PO TABS: ORAL_TABLET | ORAL | Status: DC

## 2015-01-18 NOTE — Telephone Encounter (Signed)
Patient requests a one month supply of aldactazide to her local pharmacy.

## 2015-02-03 ENCOUNTER — Other Ambulatory Visit: Payer: Self-pay | Admitting: Primary Care

## 2015-02-03 ENCOUNTER — Other Ambulatory Visit: Payer: Self-pay | Admitting: Neurology

## 2015-02-03 DIAGNOSIS — E785 Hyperlipidemia, unspecified: Secondary | ICD-10-CM

## 2015-02-03 MED ORDER — ATORVASTATIN CALCIUM 10 MG PO TABS *I*
10.0000 mg | ORAL_TABLET | Freq: Every day | ORAL | Status: DC
Start: 2015-02-03 — End: 2015-12-30

## 2015-02-03 NOTE — Telephone Encounter (Signed)
Patient only has 3 days left

## 2015-02-04 MED ORDER — CARBAMAZEPINE 100 MG PO CP12 *I*
100.0000 mg | ORAL_CAPSULE | Freq: Two times a day (BID) | ORAL | Status: DC
Start: 2015-02-04 — End: 2015-03-10

## 2015-02-04 MED ORDER — TEGRETOL XR 200 MG PO TB12
ORAL_TABLET | ORAL | Status: DC
Start: 2015-02-04 — End: 2015-02-08

## 2015-02-08 ENCOUNTER — Other Ambulatory Visit: Payer: Self-pay | Admitting: Neurology

## 2015-02-09 MED ORDER — TEGRETOL XR 200 MG PO TB12
ORAL_TABLET | ORAL | Status: DC
Start: 2015-02-09 — End: 2015-03-10

## 2015-02-23 ENCOUNTER — Ambulatory Visit
Admit: 2015-02-23 | Discharge: 2015-02-23 | Disposition: A | Discharge status: Home / Self Care | Payer: Self-pay | Source: Ambulatory Visit | Attending: Primary Care | Admitting: Primary Care

## 2015-02-23 DIAGNOSIS — G40909 Epilepsy, unspecified, not intractable, without status epilepticus: Secondary | ICD-10-CM

## 2015-02-23 DIAGNOSIS — E785 Hyperlipidemia, unspecified: Secondary | ICD-10-CM

## 2015-02-23 DIAGNOSIS — E559 Vitamin D deficiency, unspecified: Secondary | ICD-10-CM

## 2015-02-23 DIAGNOSIS — E034 Atrophy of thyroid (acquired): Secondary | ICD-10-CM

## 2015-02-23 LAB — COMPREHENSIVE METABOLIC PANEL
ALT: 25 U/L (ref 0–35)
AST: 19 U/L (ref 0–35)
Albumin: 4.7 g/dL (ref 3.5–5.2)
Alk Phos: 62 U/L (ref 35–105)
Anion Gap: 13 (ref 7–16)
Bilirubin,Total: 0.3 mg/dL (ref 0.0–1.2)
CO2: 27 mmol/L (ref 20–28)
Calcium: 9.3 mg/dL (ref 8.6–10.2)
Chloride: 99 mmol/L (ref 96–108)
Creatinine: 0.67 mg/dL (ref 0.51–0.95)
GFR,Black: 110 *
GFR,Caucasian: 95 *
Glucose: 86 mg/dL (ref 60–99)
Lab: 20 mg/dL (ref 6–20)
Potassium: 4.3 mmol/L (ref 3.3–5.1)
Sodium: 139 mmol/L (ref 133–145)
Total Protein: 7.1 g/dL (ref 6.3–7.7)

## 2015-02-23 LAB — CBC
Hematocrit: 39 % (ref 34–45)
Hemoglobin: 12.6 g/dL (ref 11.2–15.7)
MCH: 33 pg/cell — ABNORMAL HIGH (ref 26–32)
MCHC: 32 g/dL (ref 32–36)
MCV: 101 fL — ABNORMAL HIGH (ref 79–95)
Platelets: 236 10*3/uL (ref 160–370)
RBC: 3.9 MIL/uL (ref 3.9–5.2)
RDW: 13.3 % (ref 11.7–14.4)
WBC: 5.4 10*3/uL (ref 4.0–10.0)

## 2015-02-23 LAB — LIPID PANEL
Chol/HDL Ratio: 2.1
Cholesterol: 213 mg/dL — AB
HDL: 101 mg/dL
LDL Calculated: 92 mg/dL
Non HDL Cholesterol: 112 mg/dL
Triglycerides: 102 mg/dL

## 2015-02-23 LAB — TSH: TSH: 2.27 u[IU]/mL (ref 0.27–4.20)

## 2015-02-23 LAB — CK: CK: 65 U/L (ref 34–145)

## 2015-02-24 ENCOUNTER — Ambulatory Visit (INDEPENDENT_AMBULATORY_CARE_PROVIDER_SITE_OTHER): Payer: BLUE CROSS/BLUE SHIELD | Admitting: Interventional Cardiology

## 2015-02-24 ENCOUNTER — Encounter: Payer: Self-pay | Admitting: Interventional Cardiology

## 2015-02-24 VITALS — BP 122/82 | HR 60 | Ht 66.0 in | Wt 161.1 lb

## 2015-02-24 DIAGNOSIS — Z9889 Other specified postprocedural states: Secondary | ICD-10-CM | POA: Diagnosis not present

## 2015-02-24 DIAGNOSIS — I480 Paroxysmal atrial fibrillation: Secondary | ICD-10-CM | POA: Diagnosis not present

## 2015-02-24 DIAGNOSIS — Z8679 Personal history of other diseases of the circulatory system: Secondary | ICD-10-CM

## 2015-02-24 DIAGNOSIS — I10 Essential (primary) hypertension: Secondary | ICD-10-CM

## 2015-02-24 MED ORDER — METOPROLOL SUCCINATE ER 50 MG PO TB24: 50.0000 mg | ORAL_TABLET | Freq: Every day | ORAL | Status: DC

## 2015-02-24 MED ORDER — SPIRONOLACTONE-HCTZ 25-25 MG PO TABS: ORAL_TABLET | ORAL | Status: DC

## 2015-02-24 NOTE — Progress Notes (Signed)
Cardiology Office Note   Date:  02/24/2015   ID:  Ariel Simpson, DOB [birth year], MRN [id number]  PCP:  Lynne Logan, MD  Cardiologist:   Sinclair Grooms, MD   No chief complaint on file.     History of Present Illness: Ariel Simpson is a 59 y.o. female who presents for a low pulse mitral valve repair and paroxysmal atrial fibrillation and flutter. She is antegrade year without any hospitalizations, sustained arrhythmia, or physical limitations. She exercises regularly with cardio and yoga. She is able to maintain her endurance and notes that her heart rate has gradually decreased. No orthopnea, PND, or peripheral edema.    Past Medical History  Diagnosis Date  . Mitral valve prolapse   . High cholesterol   . Heart murmur   . Severe mitral regurgitation 03/03/2013    Acute on chronic with acute heart failure   . Mitral regurgitation   . Paroxysmal supraventricular tachycardia   . Paroxysmal atrial fibrillation 03/11/2013  . Shortness of breath   . PONV (postoperative nausea and vomiting)     Dizziness and Nausea  . CHF (congestive heart failure)   . Hypertension   . S/P mitral valve repair 03/18/2013    Complex valvuloplasty including triangular resection of posterior leaflet, artificial Gore-tex neocord placement x4 and 20mm Sorin Memo 3D ring annuloplasty via right mini thoracotomy  . S/P Maze operation for atrial fibrillation 03/18/2013    Complete bilateral atrial lesion set using cryothermy and biopolar radiofrequency ablation with oversewing of LA appendage via right mini thoracotomy    Past Surgical History  Procedure Laterality Date  . Cardiac catheterization    . Wisdom tooth extraction    . Vericose veins    . Abdominal hysterectomy      hernia repair  . Mitral valve repair Right 03/18/2013    Procedure: MINIMALLY INVASIVE MITRAL VALVE REPAIR (MVR);  Surgeon: Rexene Alberts, MD;  Location: [facility];  Service: Open Heart Surgery;  Laterality: Right;  .  Intraoperative transesophageal echocardiogram N/A 03/18/2013    Procedure: INTRAOPERATIVE TRANSESOPHAGEAL ECHOCARDIOGRAM;  Surgeon: Rexene Alberts, MD;  Location: [facility];  Service: Open Heart Surgery;  Laterality: N/A;  . Maze N/A 03/18/2013    Procedure: MAZE;  Surgeon: Rexene Alberts, MD;  Location: [facility];  Service: Open Heart Surgery;  Laterality: N/A;  . Left and right heart catheterization with coronary angiogram N/A 03/06/2013    Procedure: LEFT AND RIGHT HEART CATHETERIZATION WITH CORONARY ANGIOGRAM;  Surgeon: Sinclair Grooms, MD;  Location: [facility] CATH LAB;  Service: Cardiovascular;  Laterality: N/A;     Current Outpatient Prescriptions  Medication Sig Dispense Refill  . aspirin 325 MG tablet Take 325 mg by mouth daily.    Marland Kitchen atorvastatin (LIPITOR) 20 MG tablet Take 20 mg by mouth daily.    . Calcium Carbonate-Vitamin D (CALCIUM + D PO) Take 2 capsules by mouth daily.    Marland Kitchen GLUCOSA-CHONDR-NA CHONDR-MSM PO Take 2 capsules by mouth daily.    . metoprolol succinate (TOPROL-XL) 50 MG 24 hr tablet Take 1 tablet (50 mg total) by mouth daily. 90 tablet 3  . Omega-3 Fatty Acids (FISH OIL PO) Take 2 capsules by mouth daily.    . polyethylene glycol (MIRALAX / GLYCOLAX) packet Take 17 g by mouth daily.    Marland Kitchen spironolactone-hydrochlorothiazide (ALDACTAZIDE) 25-25 MG per tablet TAKE 1/2 TABLET DAILY 15 tablet 0  . zolpidem (AMBIEN) 5 MG tablet      No current  facility-administered medications for this visit.    Allergies:   Review of patient's allergies indicates no known allergies.    Social History:  The patient  reports that she has never smoked. She has never used smokeless tobacco. She reports that she drinks about 1.8 oz of alcohol per week. She reports that she does not use illicit drugs.   Family History:  The patient's family history includes Cancer in her father and mother.    ROS:  Please see the history of present illness.   Otherwise, review of systems are positive for an occasional  palpitation. Sustained arrhythmia..   All other systems are reviewed and negative.    PHYSICAL EXAM: VS:  BP 122/82 mmHg  Pulse 60  Ht 5\' 6"  (1.676 m)  Wt 161 lb 1.9 oz (73.084 kg)  BMI 26.02 kg/m2 , BMI Body mass index is 26.02 kg/(m^2). GEN: Well nourished, well developed, in no acute distress HEENT: normal Neck: no JVD, carotid bruits, or masses Cardiac: RRR; no murmurs, rubs, or gallops,no edema  Respiratory:  clear to auscultation bilaterally, normal work of breathing GI: soft, nontender, nondistended, + BS MS: no deformity or atrophy Skin: warm and dry, no rash Neuro:  Strength and sensation are intact Psych: euthymic mood, full affect   EKG:  EKG is ordered today. The ekg ordered today demonstrates sinus bradycardia, otherwise normal.   Recent Labs: 08/12/2014: BUN 17; Creatinine 1.1; Hemoglobin 11.4*; Platelets 242.0; Potassium 4.2; Sodium 136    Lipid Panel No results found for: CHOL, TRIG, HDL, CHOLHDL, VLDL, LDLCALC, LDLDIRECT    Wt Readings from Last 3 Encounters:  02/24/15 161 lb 1.9 oz (73.084 kg)  08/12/14 158 lb (71.668 kg)  03/16/14 164 lb (74.39 kg)      Other studies Reviewed: Additional studies/ records that were reviewed today include: None. Review of the above records demonstrates: None   ASSESSMENT AND PLAN:  S/P mitral valve repair: No auscultatory evidence of recurrent mitral regurgitation  S/P Maze operation for atrial fibrillation: Sustained arrhythmia  Essential hypertension: Stable  Paroxysmal atrial fibrillation: Absent  History of PSVT (paroxysmal supraventricular tachycardia)     Current medicines are reviewed at length with the patient today.  The patient has concerns regarding medicines.  The following changes have been made:  Wants to come off of diuretic therapy if possible. We will therefore decrease the Aldactazide to one half tablet Monday, Wednesday, and Friday. She will call with blood pressure data.  Labs/ tests  ordered today include:   Orders Placed This Encounter  Procedures  . EKG 12-Lead     Disposition:   FU with  Linard Millers in 1 year   Signed, Sinclair Grooms, MD  02/24/2015 11:46 AM    [facility] [place], [place], [place]  [address] Phone: [phone]; Fax: [phone]

## 2015-02-24 NOTE — Patient Instructions (Signed)
Your physician recommends that you continue on your current medications as directed. Please refer to the Current Medication list given to you today. Toprol sent in today to [facility] Aldactone sent in today to [facility], both discussed with patient.  Your physician wants you to follow-up in: 1 year with Dr. Tamala Julian. You will receive a reminder letter in the mail two months in advance. If you don't receive a letter, please call our office to schedule the follow-up appointment.

## 2015-02-25 LAB — VITAMIN D
25-OH VIT D2: <4 ng/mL
25-OH VIT D3: 42 ng/mL
25-OH Vit Total: 42 ng/mL (ref 30–60)

## 2015-02-26 LAB — CARBAMAZEPINE, FREE & TOTAL
Carbamazepine, Free: 19.6 % (ref 8.0–35.0)
Carbamazepine,Free: 2.5 ug/mL (ref 1.0–3.0)
Carbamazepine,Total: 12.7 ug/mL — ABNORMAL HIGH (ref 4.0–12.0)

## 2015-03-01 ENCOUNTER — Other Ambulatory Visit: Payer: Self-pay | Admitting: Primary Care

## 2015-03-01 ENCOUNTER — Encounter: Payer: Self-pay | Admitting: Gastroenterology

## 2015-03-02 ENCOUNTER — Telehealth: Payer: Self-pay | Admitting: Neurology

## 2015-03-02 DIAGNOSIS — Z79899 Other long term (current) drug therapy: Secondary | ICD-10-CM

## 2015-03-02 NOTE — Telephone Encounter (Signed)
Pt calling to discuss Tegretol results.  Thanks.

## 2015-03-02 NOTE — Telephone Encounter (Signed)
Spoke to patient. Total carbamazepine level 12.7, which is high for her. Has been tired and foggy, but has attributed this to stressors at home. Review of prior results shows total level to 13 in the past, lowered previously without dosage change. Free level within normal range. Will recheck in 2 weeks.  [place] M[place]uen Skipper Cliche, NP

## 2015-03-08 ENCOUNTER — Encounter: Payer: Self-pay | Admitting: Gastroenterology

## 2015-03-08 LAB — HM MAMMOGRAPHY

## 2015-03-10 ENCOUNTER — Encounter: Payer: Self-pay | Admitting: Primary Care

## 2015-03-10 ENCOUNTER — Ambulatory Visit: Payer: Self-pay | Admitting: Primary Care

## 2015-03-10 VITALS — BP 118/70 | HR 68 | Ht 63.5 in | Wt 119.0 lb

## 2015-03-10 DIAGNOSIS — G40909 Epilepsy, unspecified, not intractable, without status epilepticus: Secondary | ICD-10-CM

## 2015-03-10 DIAGNOSIS — E034 Atrophy of thyroid (acquired): Secondary | ICD-10-CM

## 2015-03-10 DIAGNOSIS — E559 Vitamin D deficiency, unspecified: Secondary | ICD-10-CM

## 2015-03-10 DIAGNOSIS — N3941 Urge incontinence: Secondary | ICD-10-CM

## 2015-03-10 DIAGNOSIS — D7589 Other specified diseases of blood and blood-forming organs: Secondary | ICD-10-CM

## 2015-03-10 DIAGNOSIS — M810 Age-related osteoporosis without current pathological fracture: Secondary | ICD-10-CM

## 2015-03-10 DIAGNOSIS — E785 Hyperlipidemia, unspecified: Secondary | ICD-10-CM

## 2015-03-10 DIAGNOSIS — I10 Essential (primary) hypertension: Secondary | ICD-10-CM

## 2015-03-10 DIAGNOSIS — K219 Gastro-esophageal reflux disease without esophagitis: Secondary | ICD-10-CM

## 2015-03-10 NOTE — H&P (Signed)
History and Physical    HISTORY:  Chief Complaint   Patient presents with    Results    Annual Exam         History of Present Illness:    HPI  Had her mammo on Monday, was fine    She started the fish oil, plans to now start on a probiotic    Urinary --bladder stable on enablex.  Still has sx of urgency and frequency    HTN--remains on amlodipine, denies CP SOB dizziness     Hyperlipidemia--remains on atorvastatin, denies myalgia.  TC 213, TG 102, HDL 101, LDL 92.  Total cholesterol has risen as pt was eating differently than usual since her mother passed away.      Osteoporosis--remains on prolia, calcium and vitin d.  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    Ringing In The Ears (Tinnitus) H93.19    GERD (gastroesophageal reflux disease) K21.9    Contusion of foot S90.30XA    Pain in joint, ankle and foot M25.579    Weakness of foot M21.40    Hyperlipidemia E78.5        Past Medical/Surgical History:   Past Medical History   Diagnosis Date    Hypertension     Hypothyroidism     Osteoporosis     Stroke     Seizure      Past Surgical History   Procedure Laterality Date    Converted procedure       Craniotomy A-V Malformation Repair Conversion Data     Hx tonsillectomy/adenoidectomy       Tonsillectomy Conversion Data     Tonsillectomy           Allergies:    Allergies   Allergen Reactions    Penicillins Hives    Sulfa Antibiotics Other (See Comments)     Serum sickness    Bactrim Other (See Comments)     Serum sickness    Phenytoin Sodium Extended Swelling  and Other (See Comments)      Edema    Seasonal Allergies Other (See Comments)     Trees,pollen,hay fever,mold          Current medications:    Current Outpatient Prescriptions   Medication Sig    carBAMazepine (TEGRETOL XR) 200 MG 12 hr tablet Take 400 mg by mouth 2 times daily (with meals)   Swallow whole. Do not crush, break, or chew. Along with 100 mg tablet    atorvastatin (LIPITOR) 10 MG tablet Take 1 tablet (10 mg total) by mouth daily (with dinner)    levothyroxine (SYNTHROID, LEVOTHROID) 25 MCG tablet TAKE 1 TABLET BY MOUTH EVERY DAY BEFORE BREAKFAST    ENABLEX 15 MG 24 hr tablet TAKE 1 TABLET BY MOUTH EVERY DAY    RABEprazole (ACIPHEX) 20 MG tablet Take 1 tablet (20 mg total) by mouth daily    carBAMazepine (TEGRETOL XR) 100 MG 12 hr tablet Take 1  tablet (100 mg total) by mouth 2 times daily (with meals)   Along with Carbamazepine 200 mg tabs    darifenacin (ENABLEX) 15 MG 24 hr tablet Take 1 tablet (15 mg total) by mouth daily    azelastine (ASTELIN) 137 MCG/SPRAY nasal spray 2 sprays by Nasal route 4 times daily   AS DIRECTED    fluticasone (FLONASE) 50 MCG/ACT nasal spray 2 sprays by Nasal route daily    Calcium Carb-Cholecalciferol 600-200 MG-UNIT TABS Take 1 tablet by mouth 3 times daily    GuaiFENesin (MUCINEX PO) Take 1 tablet by mouth 3 times daily    Denosumab (PROLIA SC) Inject 1 Device into the skin every 6 months    loratadine (CLARITIN) 10 MG tablet Take 10 mg by mouth daily    Multiple Vitamin (MULTIVITAMIN) per tablet Take 1 tablet by mouth daily    cholecalciferol (VITAMIN D) 1000 UNITS capsule Take 1,000 Units by mouth 3 times daily    amLODIPine (NORVASC) 2.5 MG tablet Take 1 tablet (2.5 mg total) by mouth daily    ipratropium (ATROVENT HFA) 17 MCG/ACT inhaler Inhale 2 puffs into the lungs 4 times daily   Shake well before each use       Family History:    Family History   Problem Relation Age of Onset    Aneurysm Other      died of a brain aneursym    Hypertension  Mother     Arthritis Mother     Cancer Father     Prostate cancer Father     Other Sister      benign brain lesion     Allergy (severe) Sister     Arthritis Sister        Social/Occupational History:   History     Social History    Marital Status: Married     Spouse Name: N/A     Number of Children: N/A    Years of Education: N/A     Occupational History    psychologist      Social History Main Topics    Smoking status: Never Smoker     Smokeless tobacco: Never Used    Alcohol Use: No    Drug Use: No    Sexual Activity: None     Other Topics Concern    None     Social History Narrative    PT and trainer , each one hour weekly--right foot         Review of Systems:    Review of Systems   Constitutional: Negative for fever and chills.        Some cold intolerance   HENT: Positive for congestion.         No new headaches, more tension and sinus  Mild congestion from allergies right now   Eyes: Negative for blurred vision, double vision and photophobia.        She has these examined every other year.    Respiratory: Negative for cough, shortness of breath and wheezing.    Cardiovascular: Negative for chest pain, palpitations and leg swelling.   Gastrointestinal: Positive for heartburn and constipation. Negative for nausea, vomiting, diarrhea, blood in stool and melena.        Heartburn about every other day.  She thinks she needs to eat less, gets painful after meals. She typically takes gas-x.    She had tried bid aciphex without any relief.    Genitourinary: Positive for urgency and frequency. Negative  for dysuria.   Musculoskeletal: Negative for back pain and neck pain.   Skin: Negative for itching and rash.   Neurological: Positive for headaches. Negative for dizziness, tingling, sensory change, speech change and focal weakness.        In a "haze" ? Grief related and also tegretol may be playing a role as well.   Psychiatric/Behavioral: Negative for depression. The patient is not nervous/anxious and  does not have insomnia.        Vital Signs:   BP 118/70 mmHg   Pulse 68   Ht 1.613 m (5' 3.5")   Wt 53.978 kg (119 lb)   BMI 20.75 kg/m2      PHYSICAL EXAM:  Physical Exam   Constitutional: She appears well-developed and well-nourished. No distress.   HENT:   Head: Normocephalic.   Right Ear: Tympanic membrane, external ear and ear canal normal.   Left Ear: Tympanic membrane, external ear and ear canal normal.   Nose: No mucosal edema. No epistaxis.   Mouth/Throat: Uvula is midline, oropharynx is clear and moist and mucous membranes are normal.   Eyes: Conjunctivae, EOM and lids are normal. Pupils are equal, round, and reactive to light.   Neck: Trachea normal. Neck supple. No JVD present. Carotid bruit is not present. No thyroid mass and no thyromegaly present.   Cardiovascular: Normal rate, regular rhythm, S1 normal and S2 normal.  Exam reveals no gallop.    No murmur heard.  Pulses:       Dorsalis pedis pulses are 2+ on the right side, and 2+ on the left side.        Posterior tibial pulses are 2+ on the right side, and 2+ on the left side.   Pulmonary/Chest: Effort normal and breath sounds normal. She has no wheezes. She has no rhonchi. She has no rales.   Breast exam without appreciable mass or axillary adenopathy   Abdominal: Soft. Bowel sounds are normal. She exhibits no distension. There is no hepatosplenomegaly. There is no tenderness.   Musculoskeletal: She exhibits no edema or tenderness.   Lymphadenopathy:     She has no cervical adenopathy.     She has no axillary adenopathy.        Right: No inguinal adenopathy present.        Left: No inguinal adenopathy present.   Neurological: She is alert. She has normal strength and normal reflexes. No cranial nerve deficit or sensory deficit. Coordination and gait normal.   Normal monofilament testing  No pronator drift  No dysmetria   Skin: Skin is warm, dry and intact. No rash noted.   Psychiatric: She has a normal mood and affect. Her behavior is normal.  Thought content normal.           Assessment:    Vance was seen today for results and annual exam.    Diagnoses and associated orders for this visit:    Macrocytosis without anemia  - Vitamin B12; Future  - Folate; Future    Hypothyroidism due to acquired atrophy of thyroid--well controlled on levothyroxine  - TSH; Future    Hyperlipidemia-pt to get back to her regular diet, continue atorvastatin on regular diet.  - Comprehensive metabolic panel; Future  - CBC; Future  - Lipid add Rfx to Drt LDL if Trig >400; Future    Vitamin D deficiency--continue vitamin d supplement and calcium  - Vitamin D; Future    Urge Incontinence Of Urine--continue enablex    Osteoporosis--continue prolia, calcium  and vitamin d    Gastroesophageal reflux disease without esophagitis--continue to manage dietary modification    Essential hypertension--meets JNC8 BP goal under 140/90, continue amlodipine     Seizure disorder--per Neurology, pt is to have her carbamazepine level rechecked in a couple weeks    HCM--up to date on HCM and immunizations     .      Plan:       see above

## 2015-03-22 ENCOUNTER — Ambulatory Visit: Payer: BC Managed Care – PPO | Admitting: Thoracic Surgery (Cardiothoracic Vascular Surgery)

## 2015-03-29 ENCOUNTER — Ambulatory Visit (INDEPENDENT_AMBULATORY_CARE_PROVIDER_SITE_OTHER): Payer: BLUE CROSS/BLUE SHIELD | Admitting: Thoracic Surgery (Cardiothoracic Vascular Surgery)

## 2015-03-29 ENCOUNTER — Encounter: Payer: Self-pay | Admitting: Thoracic Surgery (Cardiothoracic Vascular Surgery)

## 2015-03-29 VITALS — BP 138/87 | HR 59 | Resp 16 | Ht 67.0 in | Wt 160.0 lb

## 2015-03-29 DIAGNOSIS — I340 Nonrheumatic mitral (valve) insufficiency: Secondary | ICD-10-CM

## 2015-03-29 DIAGNOSIS — I480 Paroxysmal atrial fibrillation: Secondary | ICD-10-CM | POA: Diagnosis not present

## 2015-03-29 DIAGNOSIS — Z8679 Personal history of other diseases of the circulatory system: Secondary | ICD-10-CM

## 2015-03-29 DIAGNOSIS — Z9889 Other specified postprocedural states: Secondary | ICD-10-CM | POA: Diagnosis not present

## 2015-03-29 NOTE — Patient Instructions (Signed)

## 2015-03-29 NOTE — Progress Notes (Addendum)
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 She was last seen here in our office on 03/16/2014.  Since then she has continued to do very well. She reports normal exercise tolerance with no symptoms of exertional shortness of breath or fatigue whatsoever.  Last summer she noticed problems with peak exercise performance which she felt was related to beta blocker therapy. Her dose of Toprol-XL was cut back to 50 mg daily and she immediately felt much better. She states that now she can exercise without limitations. She has occasional palpitations but no documented episodes of recurrent atrial fibrillation. She states that she has been having occasional palpitations all of her life. She feels very well and reports no symptoms of exertional shortness of breath whatsoever. She continues to follow-up intermittently with Dr. Tamala Julian who saw her most recently on 02/24/2015.   Current Outpatient Prescriptions  Medication Sig Dispense Refill  . aspirin 325 MG tablet Take 325 mg by mouth daily.    Marland Kitchen atorvastatin (LIPITOR) 20 MG tablet Take 20 mg by mouth daily.    . Calcium Carbonate-Vitamin D (CALCIUM + D PO) Take 2 capsules by mouth daily.    Marland Kitchen GLUCOSA-CHONDR-NA CHONDR-MSM PO Take 2 capsules by mouth daily.    . metoprolol succinate (TOPROL-XL) 50 MG 24 hr tablet Take 1 tablet (50 mg total) by mouth daily. 90 tablet 3  . Omega-3 Fatty Acids (FISH OIL PO) Take 2 capsules by mouth daily.    . polyethylene glycol (MIRALAX / GLYCOLAX) packet Take 17 g by mouth daily.    Marland Kitchen spironolactone-hydrochlorothiazide (ALDACTAZIDE) 25-25 MG per tablet TAKE 1/2 TABLET DAILY (Patient taking differently: 0.5  tablets 3 (three) times a week. TAKE 1/2 TABLET DAILY) 45 tablet 6  . zolpidem (AMBIEN) 5 MG tablet      No current facility-administered medications for this visit.      Physical Exam:   BP 138/87 mmHg  Pulse 59  Resp 16  Ht 5\' 7"  (1.702 m)  Wt 160 lb (72.576 kg)  BMI 25.05 kg/m2  SpO2 98%  General:  Well-appearing  Chest:   clear  CV:   Regular rate and rhythm without murmur  Incisions:  n/a  Abdomen:  Soft and nontender  Extremities:  Warm and well-perfused  Diagnostic Tests:  2 channel telemetry rhythm strip demonstrates normal sinus rhythm   Impression:  Patient is doing very well approximately 2 years status post minimally invasive mitral valve repair and Maze procedure. She reports normal exercise tolerance and no symptoms other than occasional palpitations. She has been maintaining sinus rhythm.    Plan:  In the future the patient will call and return to see [place] as needed. She has been reminded regarding the lifelong need for antibiotic prophylaxis for all dental cleaning and related procedures. She will continue to follow-up with Dr. Tamala Julian. She will be referred to the atrial fibrillation clinic for long-term surveillance following maze procedure.  I spent in excess of 10 minutes during the conduct of this office consultation and >50% of this time involved direct face-to-face encounter with the patient for counseling and/or coordination  of their care.    Valentina Gu. Roxy Manns, MD 03/29/2015 4:41 PM

## 2015-03-30 ENCOUNTER — Ambulatory Visit
Admit: 2015-03-30 | Discharge: 2015-03-30 | Disposition: A | Discharge status: Home / Self Care | Payer: Self-pay | Source: Ambulatory Visit | Attending: Neurology | Admitting: Neurology

## 2015-03-30 DIAGNOSIS — E034 Atrophy of thyroid (acquired): Secondary | ICD-10-CM

## 2015-03-30 DIAGNOSIS — Z79899 Other long term (current) drug therapy: Secondary | ICD-10-CM

## 2015-03-30 DIAGNOSIS — E785 Hyperlipidemia, unspecified: Secondary | ICD-10-CM

## 2015-03-30 DIAGNOSIS — E559 Vitamin D deficiency, unspecified: Secondary | ICD-10-CM

## 2015-03-30 DIAGNOSIS — D7589 Other specified diseases of blood and blood-forming organs: Secondary | ICD-10-CM

## 2015-04-01 LAB — CARBAMAZEPINE, FREE & TOTAL
Carbamazepine, Free: 22.9 % (ref 8.0–35.0)
Carbamazepine,Free: 2.5 ug/mL (ref 1.0–3.0)
Carbamazepine,Total: 10.9 ug/mL (ref 4.0–12.0)

## 2015-04-08 ENCOUNTER — Ambulatory Visit: Payer: Self-pay | Admitting: Neurology

## 2015-04-08 ENCOUNTER — Encounter: Payer: Self-pay | Admitting: Neurology

## 2015-04-08 ENCOUNTER — Other Ambulatory Visit: Payer: Self-pay | Admitting: Primary Care

## 2015-04-08 VITALS — BP 145/73 | HR 75 | Ht 63.0 in | Wt 116.8 lb

## 2015-04-08 DIAGNOSIS — R569 Unspecified convulsions: Secondary | ICD-10-CM

## 2015-04-08 NOTE — Progress Notes (Signed)
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 She states that her mother died this last 13-Feb-2023. Over the last several months, she has had an increase in mild confusion with decreased concentration and decreased focus. She has been having slightly more difficulty with word-searching and word-substitutions.    Her ROS has otherwise been negative except for chronic tinnitus and chronic wrist and foot pain.     Exam:  Filed Vitals:    04/08/15 0958   BP: 145/73   Pulse: 75   Height: 1.6 m (5\' 3" )   Weight: 53 kg (116 lb 13.5 oz)     She is alert, oriented. Provides a clear history. No paraphasic errors. No comprehension deficits. Affect appropriate.    VFF. Version full with mild end-gaze nystagmus bilaterally. VFF. Symmetric facies.    Full strength throughout, no ataxia. Mild sustention tremor bilaterally unchanged. Mild widening of stance, with slight antalgia. Tandem not tested.    Impression:  Stable seizure disorder. Her most recent free CMZ level was 2.5. She has been that high before without side effects. I believe her recent cognitive symptoms may be related to the death of her mother.     I would favor continuing on her current dosing of Tegretol XR. Monitor for any additional changes. Check levels in 6 months, follow up at that time as well.

## 2015-04-12 ENCOUNTER — Telehealth: Payer: Self-pay | Admitting: Primary Care

## 2015-04-12 NOTE — Telephone Encounter (Signed)
Pt wants to know if it is ok for her to take coenzyme Q-10? Please advise

## 2015-04-13 NOTE — Telephone Encounter (Signed)
Yes, okay to take, Janett Billow has let her know

## 2015-04-15 NOTE — Telephone Encounter (Signed)
Pt notified 5/24

## 2015-05-11 ENCOUNTER — Ambulatory Visit: Payer: Self-pay | Admitting: Primary Care

## 2015-05-11 VITALS — BP 102/68 | HR 72 | Temp 99.4°F | Ht 62.99 in | Wt 118.8 lb

## 2015-05-11 DIAGNOSIS — J029 Acute pharyngitis, unspecified: Secondary | ICD-10-CM

## 2015-05-11 DIAGNOSIS — J0101 Acute recurrent maxillary sinusitis: Secondary | ICD-10-CM

## 2015-05-11 LAB — POCT AMBULATORY RAPID STREP
Lot #: 151464
Rapid Strep Group A Throat-POC: NEGATIVE

## 2015-05-11 MED ORDER — DOXYCYCLINE HYCLATE 100 MG PO TABS *I*
100.0000 mg | ORAL_TABLET | Freq: Two times a day (BID) | ORAL | 0 refills | Status: DC
Start: 2015-05-11 — End: 2015-07-06

## 2015-05-11 NOTE — Progress Notes (Signed)
Reason for Visit: Pharyngitis (with head congestion for 6 days)    Pt is on day 6 of head congestion, mild hot/chills sensation, sore throat, post nasal drip, fullness of head. No SOB, mild cough with post nasal or dryness.   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    Ringing In The Ears (Tinnitus) H93.19    GERD (gastroesophageal reflux disease) K21.9    Contusion of foot S90.30XA    Pain in joint, ankle and foot M25.579    Weakness of foot M21.40    Hyperlipidemia E78.5     Current Outpatient Prescriptions   Medication    azelastine (ASTELIN) 0.1 % nasal spray    amLODIPine (NORVASC) 2.5 MG tablet    carBAMazepine (CARBATROL) 100 MG 12 hr capsule    carBAMazepine (TEGRETOL XR) 200 MG 12 hr tablet    atorvastatin (LIPITOR) 10 MG tablet    levothyroxine (SYNTHROID, LEVOTHROID) 25 MCG tablet    RABEprazole (ACIPHEX) 20 MG tablet    carBAMazepine (TEGRETOL XR) 100 MG 12 hr tablet    darifenacin (ENABLEX) 15 MG 24 hr tablet    fluticasone (FLONASE) 50 MCG/ACT nasal spray    Calcium Carb-Cholecalciferol 600-200 MG-UNIT TABS    GuaiFENesin (MUCINEX PO)    Denosumab (PROLIA SC)    loratadine (CLARITIN) 10 MG tablet    Multiple Vitamin (MULTIVITAMIN) per tablet    cholecalciferol (VITAMIN D) 1000 UNITS capsule    ipratropium (ATROVENT HFA) 17 MCG/ACT inhaler     No current facility-administered medications for this visit.        Medication list reviewed and updated, no changes were made today    Exam:   Visit Vitals    BP 102/68    Pulse 72    Temp 37.4 C (99.4 F)    Ht 1.6 m (5' 2.99")    Wt  53.9 kg (118 lb 12.8 oz)    BMI 21.05 kg/m2   well-nourished alert and conversant 59 year old lady in no acute distress.  Tympanic membranes clear bilaterally.  Nasal mucosa erythematous with discolored drainage.  Maxillary sinus tenderness is noted.  Oral mucosa is moist with posterior pharyngeal cobblestoning.  Lungs are clear to auscultation bilaterally.  No wheezes rales or rhonchi    A/P:  1.  Acute maxillary sinusitis-we'll treat with doxycycline which patient has tolerated well in the past.  100 mg twice daily for 10 days.  She is to continue her prescription nasal sprays and nasal saline

## 2015-05-17 ENCOUNTER — Encounter: Payer: Self-pay | Admitting: Primary Care

## 2015-05-20 ENCOUNTER — Other Ambulatory Visit: Payer: Self-pay | Admitting: Neurology

## 2015-05-21 MED ORDER — CARBAMAZEPINE 200 MG PO TB12 *I*
400.0000 mg | ORAL_TABLET | Freq: Two times a day (BID) | ORAL | 3 refills | Status: DC
Start: 2015-05-21 — End: 2016-05-01

## 2015-05-31 ENCOUNTER — Ambulatory Visit
Admit: 2015-05-31 | Discharge: 2015-05-31 | Disposition: A | Discharge status: Home / Self Care | Payer: Self-pay | Source: Ambulatory Visit | Attending: Obstetrics | Admitting: Obstetrics

## 2015-06-01 ENCOUNTER — Ambulatory Visit
Admit: 2015-06-01 | Discharge: 2015-06-01 | Disposition: A | Discharge status: Home / Self Care | Payer: Self-pay | Source: Ambulatory Visit | Attending: Obstetrics | Admitting: Obstetrics

## 2015-06-01 LAB — BASIC METABOLIC PANEL
Anion Gap: 12 (ref 7–16)
CO2: 28 mmol/L (ref 20–28)
Calcium: 9.4 mg/dL (ref 8.6–10.2)
Chloride: 100 mmol/L (ref 96–108)
Creatinine: 0.75 mg/dL (ref 0.51–0.95)
GFR,Black: 99 *
GFR,Caucasian: 86 *
Glucose: 85 mg/dL (ref 60–99)
Lab: 18 mg/dL (ref 6–20)
Potassium: 4.4 mmol/L (ref 3.3–5.1)
Sodium: 140 mmol/L (ref 133–145)

## 2015-06-01 LAB — MAGNESIUM: Magnesium: 1.9 mEq/L (ref 1.3–2.1)

## 2015-06-02 LAB — GYN CYTOLOGY

## 2015-06-28 ENCOUNTER — Encounter: Payer: Self-pay | Admitting: Gastroenterology

## 2015-06-28 ENCOUNTER — Telehealth: Payer: Self-pay | Admitting: Interventional Cardiology

## 2015-06-28 NOTE — Telephone Encounter (Signed)
Patient lives in [place] and over the summer and had to go to ED for SVT, adenosine given  Patient was walking on th treadmill when it started, she does exercise regularly Last episode of SVT around 12 years ago Takes Metoprolol 50 mg daily, had forgotten to take day before episode.   1) When does she need follow up appointment?  2) Should she find a cardiologist near her [place] home?      If so, would like to know if Dr Tamala Julian can recommend one in the [place], [place], or [place] area.   3) Should she have something to take if it happens again? Patient has been taking her Aldactazide 25-25 mg 1/2 tablet 3 times a week. Her average blood pressure 118/84 HR 59.   1) What is should her blood pressure goal be?    Will forward to Dr Tamala Julian for review

## 2015-06-28 NOTE — Telephone Encounter (Signed)
I would not change anything if there were missed doses of metoprolol. I also don't think new medications should be added unless recurrence on therapy. If it happens, would be okay to take 1 extra metoprolol.  If she is going to be there for quite some time, it might be a god idea to check in with a cardiologist, but I don't personally have a recommendation.

## 2015-06-28 NOTE — Telephone Encounter (Signed)
New Message  Pt called states that she had SVT over the weekend and she needs advice on what she is supposed to do moving forward. Request a call back to determine if the records from the [facility] was recieved. She would also like to discuss her medications. Please call

## 2015-06-29 NOTE — Telephone Encounter (Signed)
Left message to call back  

## 2015-06-29 NOTE — Telephone Encounter (Signed)
Pt aware of Dr.Smith's response. would not change anything if there were missed doses of metoprolol. I also don't think new medications should be added unless recurrence on therapy. If it happens, would be okay to take 1 extra metoprolol.  If she is going to be there for quite some time, it might be a god idea to check in with a cardiologist, but I don't personally have a recommendation.  Pt will establish with a cardiologist in [place]. To be seen while she is there for the summers.  Pt aware we have received her records from the ED visit in [place]. They will be given to Dr.Smith to review. We will call back if he has any additional recommendations. Pt verbalized understanding.

## 2015-07-02 NOTE — Telephone Encounter (Signed)
Ariel Simpson spoke with patient so no call back necessary

## 2015-07-06 ENCOUNTER — Encounter: Payer: Self-pay | Admitting: Primary Care

## 2015-07-06 ENCOUNTER — Ambulatory Visit: Payer: Self-pay | Admitting: Primary Care

## 2015-07-06 VITALS — BP 124/72 | HR 80 | Ht 62.99 in | Wt 122.0 lb

## 2015-07-06 DIAGNOSIS — M810 Age-related osteoporosis without current pathological fracture: Secondary | ICD-10-CM

## 2015-07-06 DIAGNOSIS — I10 Essential (primary) hypertension: Secondary | ICD-10-CM

## 2015-07-06 DIAGNOSIS — E559 Vitamin D deficiency, unspecified: Secondary | ICD-10-CM

## 2015-07-06 DIAGNOSIS — E7800 Pure hypercholesterolemia, unspecified: Secondary | ICD-10-CM

## 2015-07-06 DIAGNOSIS — E034 Atrophy of thyroid (acquired): Secondary | ICD-10-CM

## 2015-07-06 DIAGNOSIS — K219 Gastro-esophageal reflux disease without esophagitis: Secondary | ICD-10-CM

## 2015-07-06 DIAGNOSIS — Z5181 Encounter for therapeutic drug level monitoring: Secondary | ICD-10-CM

## 2015-07-06 NOTE — Progress Notes (Signed)
Reason for Visit: Follow-up  gastroesophageal reflux disease and irritable bowel-  She has started taking her husband's digestive enzymes  Will try going down to every other day aciphex.  Would like to do this given her known history of osteoporosis in the potential association with chronic PPI use    Vitamin B for neuro issues  She is on coenzyme Q10.   ?lovaza-- this with a good idea given that she has not been able to tolerate fish oil.    Dr. Johna Sheriff said he saw a slight tremor in her neck. She denies any tremor elsewhere.  Denies rigidity.      She remains unable and will be due for a cholesterol check in October.    Hypertension has been well controlled on amlodipine.  Denies chest pain, shortness of breath, dizziness or headache.    Seizure disorder has been well controlled on carbamazepine.      Osteoporosis-continues to take calcium, vitamin D and does work on getting regular weightbearing exercise.  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    Ringing In The Ears (Tinnitus) H93.19    GERD (gastroesophageal reflux disease) K21.9    Contusion of foot S90.30XA    Pain in joint, ankle and foot M25.579    Weakness of foot M21.40    Hyperlipidemia E78.5     Current Outpatient Prescriptions   Medication    carBAMazepine (TEGRETOL XR) 200 MG 12 hr tablet    azelastine (ASTELIN) 0.1 % nasal spray    amLODIPine (NORVASC) 2.5 MG tablet    atorvastatin (LIPITOR) 10 MG tablet    levothyroxine (SYNTHROID,  LEVOTHROID) 25 MCG tablet    RABEprazole (ACIPHEX) 20 MG tablet    carBAMazepine (TEGRETOL XR) 100 MG 12 hr tablet    darifenacin (ENABLEX) 15 MG 24 hr tablet    fluticasone (FLONASE) 50 MCG/ACT nasal spray    Calcium Carb-Cholecalciferol 600-200 MG-UNIT TABS    GuaiFENesin (MUCINEX PO)    Denosumab (PROLIA SC)    loratadine (CLARITIN) 10 MG tablet    Multiple Vitamin (MULTIVITAMIN) per tablet    cholecalciferol (VITAMIN D) 1000 UNITS capsule    ipratropium (ATROVENT HFA) 17 MCG/ACT inhaler     No current facility-administered medications for this visit.        Medication list reviewed and updated, no changes were made today    Exam:   Visit Vitals    BP 102/68    Pulse 80    Ht 1.6 m (5' 2.99")    Wt 55.3 kg (122 lb)    BMI 21.62 kg/m2   well-nourished alert and conversant 60 year old lady in no acute distress.  Lungs are clear to auscultation bilaterally.  Cardiac exam reveals a normal S1 and S2 with a regular rate and rhythm.  Abdomen is soft and nontender.  No epigastric tenderness.  Extremities are without edema.  Today I'm not able to  appreciate any tremor in her neck.  There is no cogwheel rigidity noted.  Hands are without tremor.  Per patient request, breast exam was completed today.  Patient has small fibrocystic breasts but no evidence of mass or axillary adenopathy was appreciated.  No nipple inversion.  No skin changes.     A/P:  1.  Gastroesophageal reflux disease-trial of every other day AcipHex with daily use of digestive enzymes and strict antireflux measures.  2.hyperlipidemia-continue atorvastatin.  Check fasting lipid profile in October.  Depending on results, can discuss whether lovaza would be a good idea as she cannot tolerate fish oil.  3.  Hypothyroidism-check TSH and adjust regimen accordingly with next set of labs.  4.  Osteoporosis-plan for bone density scan in January, pending those results we'll decide if she needs referral back to the metabolic bone clinic.  Thus far,  Dr. Vella Raring has been very helpful in caring for her osteoporosis.  Continue calcium, vitamin D and regular weightbearing exercise was recommended.  Try to limit PPI use  5.  Hypertension-well controlled, meets JNC 8 blood pressure goal under 140/90, no changes were made today.continue amlodipine.

## 2015-07-12 ENCOUNTER — Encounter: Payer: Self-pay | Admitting: Primary Care

## 2015-07-16 ENCOUNTER — Ambulatory Visit: Payer: Self-pay | Admitting: Audiology

## 2015-07-19 NOTE — Progress Notes (Signed)
[facility] [address], [address]  [place],  [place] [address]  Phone: [phone], Fax: [phone]     Out Patient Visit  Patient: [name]   MR Number: [id number]   Date of Birth: 23-Mar-1956   Date of Visit: 07/16/2015     PURE-TONE TEST RESULTS  Type of Testing: conventional  Test Reliability: good  Transducer: headphone  ANSI S3.21.2004 (R2009)     Air Conduction Testing (dB HL and kHz)  Air and Bone conduction thresholds were masked when appropriate       LEFT EAR RIGHT EAR     0.125 0.25 0.50  0.75 1.0 1.5 2.0 3.0 4.0 6.0 8.0  0.125 0.25 0.50 0.75 1.0 1.5 2.0 3.0 4.0 6.0 8.0     5 5   15   25 25  45   40      5 0   25   35 40 25 40 60     Bone Conduction Testing (dB HL and kHz)  Bone conduction was not tested-known sensorineural hearing loss      0.25 0.50 0.75 1.0 1.5 2.0 3.0 4.0     0.25 0.50 0.75 1.0 1.5 2.0 3.0 4.0                                                  SPEECH AUDIOMETRY     SAT SRT Score dB HL EML  Test  SAT SRT Score dB HL EML  Test       96 % 60 30 W-22 CD      96 % 60 30 W-22 CD   EML   EML            EML   EML               Please refer to scanned [facility] [id number] MR for additional information     Notes  Threshold in dB HL  Frequency in kiloHertz (kHz) Legend   dB=decibels  HL=Hearing Level  NR=No Response  VT=Vibro-Tactile EML=Effective Masking Level  SAT=Speech Awareness Threshold  SRT=Speech Reception  Threshold  MLV=Monitored Live Voice  CD=Compact Disk       HISTORY: Patient seen for an audiological evaluation. Patient is also followed by Dr. Leeann Must. Patient reports a possible decrease in her right ear hearing at least over the past year. She denies any tinnitus, dizziness, otalgia or history of noise exposure. Her last hearing evaluation was completed in January 2011 at [facility]. She was fit with an Powell for her right ear through this facility on 08/02/10. On this date, patient reports that  she only wore that aid for a few months and discontinued use. She expressed an interest this date in wearing the aid again.    FINDINGS: Pure-tone test results for the left ear indicate normal hearing sensitivity at [phone] Hz with a mild to moderate  loss at 4000-8000 Hz. Right ear testing indicates normal hearing sensitivity at [phone] with mild to moderate loss at 2000-8000 Hz. The loss is known to be sensorineural in nature from previous testing.  Speech recognition ability in quiet was excellent bilaterally. When compared to the 2011 evaluation, results are stable except for a 20-25dB decrease this date in the left ear  at 4000-8000 Hz.     HEARING AID CHECK:   Current hearing aid information:   XIH:WTUUE      Make: Oticon      Model: Agil       Serial Number: [id number]            Patient was seen for a hearing aid check.  Hearing aid was cleaned and checked.  Hearing aid settings were verified using Simulated-Real Ear Measurement (S-REM).  Since patient has reportedly not worn the aid in approximately five years; she asked for instruction in the care, use, and proper placement/insertion of the aid. During this instruction, patient indicated that she had a stroke since her last evaluation and one of the residual side effects is sensitivity to louder sounds. She also reported that she is very sensitive to the feeling/sensation of aid in her ear canal. Sound quality of the aid was reported as comfortable and patient wanted no adjustments to the setting this date. If tolerated and wearing time of the aid can be increased, then a hearing aid check in six months was dicussed.    RECOMMENDATIONS: 1) Annual audiological re-evaluation and hearing aid check.       Earnest Bailey, Au.D.,CCC/A  [facility]

## 2015-07-30 ENCOUNTER — Ambulatory Visit
Admission: RE | Admit: 2015-07-30 | Discharge: 2015-07-30 | Disposition: A | Discharge status: Home / Self Care | Payer: Self-pay | Source: Ambulatory Visit | Attending: Family | Admitting: Family

## 2015-07-31 LAB — VAGINITIS SCREEN: DNA PROBE: Vaginitis Screen:DNA Probe: 0

## 2015-07-31 LAB — AEROBIC CULTURE: Aerobic Culture: 0

## 2015-08-12 ENCOUNTER — Other Ambulatory Visit: Payer: Self-pay | Admitting: Primary Care

## 2015-08-12 MED ORDER — AZELASTINE HCL 137 MCG/SPRAY NA SOLN *I*
2.0000 | Freq: Two times a day (BID) | NASAL | 3 refills | Status: DC
Start: 2015-08-12 — End: 2016-07-13

## 2015-08-12 MED ORDER — AMLODIPINE BESYLATE 2.5 MG PO TABS *I*
2.5000 mg | ORAL_TABLET | Freq: Every day | ORAL | 3 refills | Status: DC
Start: 2015-08-12 — End: 2016-11-09

## 2015-09-07 ENCOUNTER — Ambulatory Visit: Payer: Self-pay

## 2015-09-07 DIAGNOSIS — Z23 Encounter for immunization: Secondary | ICD-10-CM

## 2015-09-07 NOTE — Progress Notes (Signed)
Pt received 1/2 dose of quadrivalent influenza vaccine per her request.

## 2015-09-14 ENCOUNTER — Ambulatory Visit: Payer: Self-pay

## 2015-09-14 DIAGNOSIS — Z23 Encounter for immunization: Secondary | ICD-10-CM

## 2015-09-14 NOTE — Progress Notes (Signed)
Pt tolerated 1/2 dose (2nd dose) quadrivalent influenza vaccine as routine health maintenance

## 2015-09-20 ENCOUNTER — Encounter: Payer: Self-pay | Admitting: Gastroenterology

## 2015-10-12 ENCOUNTER — Ambulatory Visit: Payer: Self-pay | Admitting: Primary Care

## 2015-10-27 ENCOUNTER — Ambulatory Visit: Payer: Self-pay | Admitting: Primary Care

## 2015-10-27 ENCOUNTER — Encounter: Payer: Self-pay | Admitting: Primary Care

## 2015-10-27 VITALS — BP 128/78 | HR 64 | Temp 99.3°F | Ht 63.0 in | Wt 120.0 lb

## 2015-10-27 DIAGNOSIS — I10 Essential (primary) hypertension: Secondary | ICD-10-CM

## 2015-10-27 DIAGNOSIS — E034 Atrophy of thyroid (acquired): Secondary | ICD-10-CM

## 2015-10-27 DIAGNOSIS — E7800 Pure hypercholesterolemia, unspecified: Secondary | ICD-10-CM

## 2015-10-27 DIAGNOSIS — J0101 Acute recurrent maxillary sinusitis: Secondary | ICD-10-CM

## 2015-10-27 MED ORDER — DOXYCYCLINE HYCLATE 100 MG PO TABS *I*
100.0000 mg | ORAL_TABLET | Freq: Two times a day (BID) | ORAL | 0 refills | Status: DC
Start: 2015-10-27 — End: 2015-11-10

## 2015-10-27 NOTE — Progress Notes (Signed)
Reason for Visit: Sinusitis (sinus pain and pressure for 10 days.  )    SHe has had sinus pressure for ten days now.  Across her forehead.   Not a lot of sinus congestion.  Ears and head are full.    Clearing throat.   Nauseated, one episode of diarrhea.  No chills or sweats.    She has been fatigued, napped yesterday.  No cough, just the throat clearing.     Patient wonders if we could address her follow-up concerns which we were planning to see her for in January.  Hypertension-tolerating amlodipine.  Denies chest pain, shortness of breath or dizziness.    Hyperlipidemia-remains on atorvastatin.  Denies myalgia.  Hypothyroidism-continues on levothyroxine.  Denies undue fatigue or unexplained changes in weight or appetite.    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    Ringing In The Ears (Tinnitus) H93.19    GERD (gastroesophageal reflux disease) K21.9    Contusion of foot S90.30XA    Pain in joint, ankle and foot M25.579    Weakness of foot M21.40    Hyperlipidemia E78.5     Current Outpatient Prescriptions   Medication    amLODIPine (NORVASC) 2.5 MG tablet    azelastine (ASTELIN) 0.1 % nasal spray    carBAMazepine (TEGRETOL XR) 200 MG 12 hr tablet    atorvastatin (LIPITOR) 10 MG tablet    levothyroxine (SYNTHROID, LEVOTHROID) 25 MCG tablet    RABEprazole (ACIPHEX) 20 MG tablet    carBAMazepine (TEGRETOL XR) 100 MG 12 hr tablet    darifenacin (ENABLEX) 15 MG 24 hr tablet    fluticasone (FLONASE) 50 MCG/ACT nasal spray    Calcium Carb-Cholecalciferol 600-200 MG-UNIT TABS    GuaiFENesin (MUCINEX PO)     Denosumab (PROLIA SC)    loratadine (CLARITIN) 10 MG tablet    Multiple Vitamin (MULTIVITAMIN) per tablet    cholecalciferol (VITAMIN D) 1000 UNITS capsule    ipratropium (ATROVENT HFA) 17 MCG/ACT inhaler     No current facility-administered medications for this visit.        Medication list reviewed and updated, no changes were made today    Exam:   Visit Vitals    BP 118/80    Pulse 64    Temp 37.4 C (99.3 F)    Ht 1.6 m (5\' 3" )    Wt 54.4 kg (120 lb)    BMI 21.26 kg/m2    well-nourished alert conversant 59 year old lady in no acute distress.  Tympanic membranes with slight serous effusions.  Nasal mucosa is intensely erythematous with some dark exudate on the left.  Oral mucosa is moist with posterior pharyngeal cobblestoning.  Shotty left posterior cervical adenopathy.  Lungs are clear to auscultation bilaterally.  No wheezes rales or rhonchi.  Per patient request, completed a breast exam which revealed fibrocystic breasts but no evidence of concerning mass.  No nipple inversion or skin changes.  No axillary adenopathy appreciated.  Cardiac exam reveals a normal S1-S2 with a regular rate  and rhythm no rubs murmurs or gallops.  Abdomen is soft and nontender.  Extremities are without edema    A/P:  1.  Acute maxillary sinusitis-treated with 2 weeks of doxycycline.  Continue nasal saline, Claritin, Flonase  and Mucinex.  2.  Hypertension-well controlled, meets JNC 8 blood pressure goal under 140/90 on amlodipine, no changes are made today.  3.  Hyperlipidemia-continue atorvastatin, check lipid panel and adjust accordingly.  4.  Hypothyroidism-check TSH and adjust regimen accordingly   We will cancel patient's appointment for early next month and that out another 3 months

## 2015-10-28 ENCOUNTER — Encounter: Payer: Self-pay | Admitting: Primary Care

## 2015-11-01 ENCOUNTER — Other Ambulatory Visit: Payer: Self-pay | Admitting: Primary Care

## 2015-11-01 DIAGNOSIS — N3941 Urge incontinence: Secondary | ICD-10-CM

## 2015-11-01 MED ORDER — LEVOTHYROXINE SODIUM 25 MCG PO TABS *I*
25.0000 ug | ORAL_TABLET | Freq: Every day | ORAL | 3 refills | Status: DC
Start: 2015-11-01 — End: 2016-11-09

## 2015-11-01 MED ORDER — RABEPRAZOLE SODIUM 20 MG PO TBEC *I*
20.0000 mg | DELAYED_RELEASE_TABLET | Freq: Every day | ORAL | 3 refills | Status: DC
Start: 2015-11-01 — End: 2016-11-09

## 2015-11-01 MED ORDER — DARIFENACIN HYDROBROMIDE 15 MG PO TB24 *A*
15.0000 mg | ORAL_TABLET | Freq: Every day | ORAL | 3 refills | Status: DC
Start: 2015-11-01 — End: 2016-11-09

## 2015-11-10 ENCOUNTER — Encounter: Payer: Self-pay | Admitting: Primary Care

## 2015-11-10 ENCOUNTER — Ambulatory Visit: Payer: Self-pay | Admitting: Primary Care

## 2015-11-10 VITALS — BP 122/78 | HR 60 | Temp 98.4°F | Ht 63.0 in | Wt 118.2 lb

## 2015-11-10 DIAGNOSIS — J0101 Acute recurrent maxillary sinusitis: Secondary | ICD-10-CM

## 2015-11-10 DIAGNOSIS — H60542 Acute eczematoid otitis externa, left ear: Secondary | ICD-10-CM

## 2015-11-10 MED ORDER — TRIAMCINOLONE ACETONIDE 0.05 % EX OINT *I*
TOPICAL_OINTMENT | Freq: Two times a day (BID) | CUTANEOUS | 0 refills | Status: DC | PRN
Start: 2015-11-10 — End: 2016-08-08

## 2015-11-10 MED ORDER — DOXYCYCLINE HYCLATE 100 MG PO TABS *I*
100.0000 mg | ORAL_TABLET | Freq: Two times a day (BID) | ORAL | 0 refills | Status: DC
Start: 2015-11-10 — End: 2016-03-14

## 2015-11-10 NOTE — Progress Notes (Signed)
Reason for Visit: Sinusitis (left ear congestion, head congestion, fatigue)    Her left ear was very red, feeling full and plugged. The ear canal was red. Is very itchy  Taking sudafed once daily  Drainage is clear. Minimal cough. Ongoing post nasal drip. Feels like she is improving, but not better yet  No SOB   No f/c/night sweats  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    Ringing In The Ears (Tinnitus) H93.19    GERD (gastroesophageal reflux disease) K21.9    Pain in joint, ankle and foot M25.579    Weakness of foot M21.40    Hyperlipidemia E78.5     Current Outpatient Prescriptions   Medication    RABEprazole (ACIPHEX) 20 MG tablet    levothyroxine (SYNTHROID, LEVOTHROID) 25 MCG tablet    darifenacin (ENABLEX) 15 MG 24 hr tablet    doxycycline (VIBRA-TABS) 100 MG tablet    amLODIPine (NORVASC) 2.5 MG tablet    azelastine (ASTELIN) 0.1 % nasal spray    carBAMazepine (TEGRETOL XR) 200 MG 12 hr tablet    atorvastatin (LIPITOR) 10 MG tablet    carBAMazepine (TEGRETOL XR) 100 MG 12 hr tablet    fluticasone (FLONASE) 50 MCG/ACT nasal spray    Calcium Carb-Cholecalciferol 600-200 MG-UNIT TABS    GuaiFENesin (MUCINEX PO)    Denosumab (PROLIA SC)    loratadine (CLARITIN) 10 MG tablet    Multiple Vitamin (MULTIVITAMIN) per tablet    cholecalciferol (VITAMIN D) 1000 UNITS capsule    ipratropium (ATROVENT HFA) 17 MCG/ACT inhaler     No current facility-administered medications for this visit.        Medication list reviewed and updated, no changes were made today    Exam:   Visit Vitals    BP 122/78     Pulse 60    Temp 36.9 C (98.4 F)    Ht 1.6 m (5\' 3" )    Wt 53.6 kg (118 lb 3.2 oz)    BMI 20.94 kg/m2     Well nourished alert and conversant 59 yo in nad  TMs clear bilaterally; nasal mucosa erythematous and raw, sinuses tender to palpation  External canal with slight erythema and scaling at the outlet of the canal   Neck is supple with scattered tender adenopathy anteriorly  A/P:  1. Persistent sinusitis, maxillary--will extend doxy another seven days  Nasal saline, mucinex, okay to use sudafed sparingly   2.  Eczema left external ear canal--triamcinolone ointment sparingly bid

## 2015-11-19 ENCOUNTER — Ambulatory Visit: Payer: Self-pay | Admitting: Primary Care

## 2015-11-23 ENCOUNTER — Ambulatory Visit: Payer: Self-pay | Admitting: Primary Care

## 2015-12-03 ENCOUNTER — Ambulatory Visit
Admission: RE | Admit: 2015-12-03 | Discharge: 2015-12-03 | Disposition: A | Discharge status: Home / Self Care | Payer: Self-pay | Source: Ambulatory Visit

## 2015-12-03 DIAGNOSIS — E559 Vitamin D deficiency, unspecified: Secondary | ICD-10-CM

## 2015-12-03 DIAGNOSIS — E7800 Pure hypercholesterolemia, unspecified: Secondary | ICD-10-CM

## 2015-12-03 DIAGNOSIS — Z79899 Other long term (current) drug therapy: Secondary | ICD-10-CM

## 2015-12-03 DIAGNOSIS — Z5181 Encounter for therapeutic drug level monitoring: Secondary | ICD-10-CM

## 2015-12-03 DIAGNOSIS — I10 Essential (primary) hypertension: Secondary | ICD-10-CM

## 2015-12-03 LAB — COMPREHENSIVE METABOLIC PANEL
ALT: 27 U/L (ref 0–35)
AST: 22 U/L (ref 0–35)
Albumin: 4.5 g/dL (ref 3.5–5.2)
Alk Phos: 65 U/L (ref 35–105)
Anion Gap: 18 — ABNORMAL HIGH (ref 7–16)
Bilirubin,Total: 0.3 mg/dL (ref 0.0–1.2)
CO2: 24 mmol/L (ref 20–28)
Calcium: 9.2 mg/dL (ref 8.6–10.2)
Chloride: 99 mmol/L (ref 96–108)
Creatinine: 0.68 mg/dL (ref 0.51–0.95)
GFR,Black: 108 *
GFR,Caucasian: 94 *
Glucose: 97 mg/dL (ref 60–99)
Lab: 18 mg/dL (ref 6–20)
Potassium: 4.3 mmol/L (ref 3.3–5.1)
Sodium: 141 mmol/L (ref 133–145)
Total Protein: 6.9 g/dL (ref 6.3–7.7)

## 2015-12-03 LAB — LIPID PANEL
Chol/HDL Ratio: 1.7
Cholesterol: 225 mg/dL — AB
HDL: 132 mg/dL
LDL Calculated: 77 mg/dL
Non HDL Cholesterol: 93 mg/dL
Triglycerides: 82 mg/dL

## 2015-12-03 LAB — CBC
Hematocrit: 41 % (ref 34–45)
Hemoglobin: 13.5 g/dL (ref 11.2–15.7)
MCH: 33 pg/cell — ABNORMAL HIGH (ref 26–32)
MCHC: 33 g/dL (ref 32–36)
MCV: 101 fL — ABNORMAL HIGH (ref 79–95)
Platelets: 224 10*3/uL (ref 160–370)
RBC: 4.1 MIL/uL (ref 3.9–5.2)
RDW: 13.6 % (ref 11.7–14.4)
WBC: 5.1 10*3/uL (ref 4.0–10.0)

## 2015-12-03 LAB — FOLATE: Folate: >20 ng/mL (ref >=4.6)

## 2015-12-03 LAB — TSH: TSH: 1.77 u[IU]/mL (ref 0.27–4.20)

## 2015-12-03 LAB — VITAMIN B12: Vitamin B12: 869 pg/mL (ref 211–946)

## 2015-12-03 LAB — CK: CK: 76 U/L (ref 34–145)

## 2015-12-03 LAB — MAGNESIUM: Magnesium: 1.7 mEq/L (ref 1.3–2.1)

## 2015-12-03 LAB — MULTIPLE ORDERING DOCS

## 2015-12-06 LAB — CARBAMAZEPINE, FREE & TOTAL
Carbamazepine, Free: 21 % (ref 8.0–35.0)
Carbamazepine,Free: 2.5 ug/mL (ref 1.0–3.0)
Carbamazepine,Total: 11.9 ug/mL (ref 4.0–12.0)

## 2015-12-07 LAB — VITAMIN D
25-OH VIT D2: <4 ng/mL
25-OH VIT D3: 45 ng/mL
25-OH Vit Total: 45 ng/mL (ref 30–60)

## 2015-12-13 ENCOUNTER — Other Ambulatory Visit: Payer: Self-pay | Admitting: Gastroenterology

## 2015-12-13 LAB — HM DEXA SCAN

## 2015-12-30 ENCOUNTER — Other Ambulatory Visit: Payer: Self-pay | Admitting: Primary Care

## 2015-12-30 DIAGNOSIS — E785 Hyperlipidemia, unspecified: Secondary | ICD-10-CM

## 2015-12-30 MED ORDER — ATORVASTATIN CALCIUM 10 MG PO TABS *I*
10.0000 mg | ORAL_TABLET | Freq: Every day | ORAL | 3 refills | Status: DC
Start: 2015-12-30 — End: 2016-11-09

## 2016-01-03 ENCOUNTER — Other Ambulatory Visit: Payer: Self-pay | Admitting: Neurology

## 2016-01-04 ENCOUNTER — Other Ambulatory Visit: Payer: Self-pay | Admitting: Neurology

## 2016-01-04 ENCOUNTER — Telehealth: Payer: Self-pay | Admitting: *Deleted

## 2016-01-04 DIAGNOSIS — I10 Essential (primary) hypertension: Secondary | ICD-10-CM

## 2016-01-04 MED ORDER — CARBAMAZEPINE 100 MG PO CP12 *I*
100.0000 mg | ORAL_CAPSULE | Freq: Two times a day (BID) | ORAL | 3 refills | Status: DC
Start: 2016-01-04 — End: 2016-11-09

## 2016-01-04 MED ORDER — CARBAMAZEPINE 200 MG PO TB12 *I*
200.0000 mg | ORAL_TABLET | Freq: Two times a day (BID) | ORAL | 3 refills | Status: DC
Start: 2016-01-04 — End: 2016-03-14

## 2016-01-04 NOTE — Telephone Encounter (Signed)
Patient calling back she is worry stated she needs Rx ASAP

## 2016-01-04 NOTE — Telephone Encounter (Signed)
Patient left a voicemail on the refill line requesting a refill on [facility]. She stated that she takes 1/2 qd, but it looks like per the notes that she is to take 1/2 three times a week. She also stated that the rx needs to be written for the [employer]. Please advise. Thanks, MI

## 2016-01-05 MED ORDER — SPIRONOLACTONE-HCTZ 25-25 MG PO TABS: ORAL_TABLET | ORAL | Status: DC

## 2016-01-05 NOTE — Telephone Encounter (Signed)
Spoke with pt. Pt sts that she is taking Spironolactone HCTZ 1/2 tab daily. Pt sts that at her last o/v with Dr.Smith she discussed reducing her diuretic. She was to reduce to 3 times a wk at her choice, she should return to daily if her bp became consistently elevated. Pt sts that her bp was running high so she resumed taking it daily. Rx sent to pt pharmacy for 1/2 tab daily. Pt rqst tablets for [employer]. rqst placed in notes to pt pharmacy.

## 2016-01-05 NOTE — Telephone Encounter (Signed)
Called pt to verify how she is taking Spironolactone/hctz. lmtcb

## 2016-02-03 ENCOUNTER — Encounter: Payer: Self-pay | Admitting: Neurology

## 2016-02-03 ENCOUNTER — Ambulatory Visit: Payer: Self-pay | Admitting: Neurology

## 2016-02-03 VITALS — BP 142/67 | HR 73 | Ht 63.0 in | Wt 118.0 lb

## 2016-02-03 DIAGNOSIS — R569 Unspecified convulsions: Secondary | ICD-10-CM

## 2016-02-18 NOTE — Progress Notes (Signed)
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  Overall she's been doing very well.  She has had no recurrent seizures.  She has been tolerating the Tegretol well.    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  She is open to increase this in the fall.  She continues to receive physical therapy through a trainer at home.    Her review of systems is otherwise notable only for chronic hearing loss and tinnitus, occasional heartburn, occasional urinary leakage.  She has had no change in her long-standing tremor.    Examination:  Vitals:    02/03/16 1139   BP: 142/67   Pulse: 73   Weight: 53.5 kg (118 lb)   Height: 1.6 m (5\' 3" )           She is alert and oriented.  Her speech is clear.  Comprehension is intact.  Visual fields are full to confrontation.  She continues to have mild end gaze nystagmus bilaterally.  Facial strength is symmetric.  She has full strength throughout.  There is no significant ataxia.  She has a bilateral suspension tremor.    Impression:    Stable seizure disorder with no recent changes in her Tegretol.  She appears to be doing a bit better than compared to her last visit.  I plan seeing her again in 6 months.

## 2016-02-21 ENCOUNTER — Ambulatory Visit: Payer: Self-pay | Admitting: Neurology

## 2016-03-06 ENCOUNTER — Ambulatory Visit
Admission: RE | Admit: 2016-03-06 | Discharge: 2016-03-06 | Payer: Self-pay | Source: Ambulatory Visit | Attending: Neurology | Admitting: Neurology

## 2016-03-06 DIAGNOSIS — Z79899 Other long term (current) drug therapy: Secondary | ICD-10-CM

## 2016-03-14 ENCOUNTER — Encounter: Payer: Self-pay | Admitting: Primary Care

## 2016-03-14 ENCOUNTER — Ambulatory Visit: Payer: Self-pay | Admitting: Primary Care

## 2016-03-14 VITALS — BP 112/76 | HR 72 | Ht 62.99 in | Wt 118.4 lb

## 2016-03-14 DIAGNOSIS — Z5181 Encounter for therapeutic drug level monitoring: Secondary | ICD-10-CM

## 2016-03-14 DIAGNOSIS — E063 Autoimmune thyroiditis: Secondary | ICD-10-CM

## 2016-03-14 DIAGNOSIS — E7800 Pure hypercholesterolemia, unspecified: Secondary | ICD-10-CM

## 2016-03-14 DIAGNOSIS — I10 Essential (primary) hypertension: Secondary | ICD-10-CM

## 2016-03-14 DIAGNOSIS — K589 Irritable bowel syndrome without diarrhea: Secondary | ICD-10-CM

## 2016-03-14 DIAGNOSIS — E038 Other specified hypothyroidism: Secondary | ICD-10-CM

## 2016-03-14 DIAGNOSIS — M810 Age-related osteoporosis without current pathological fracture: Secondary | ICD-10-CM

## 2016-03-14 MED ORDER — DOXYCYCLINE HYCLATE 100 MG PO TABS *I*
100.0000 mg | ORAL_TABLET | Freq: Two times a day (BID) | ORAL | 0 refills | Status: DC
Start: 2016-03-14 — End: 2016-08-08

## 2016-03-14 MED ORDER — RANITIDINE HCL 150 MG PO TABS *I*
150.0000 mg | ORAL_TABLET | Freq: Two times a day (BID) | ORAL | 5 refills | Status: DC
Start: 2016-03-14 — End: 2016-08-27

## 2016-03-14 NOTE — Progress Notes (Signed)
Reason for Visit: Follow-up (bp )    Feels congested as of yesterday, head feels full.   Left is ringing and right is plugged.  Patient does have a history of recurrent sinus infections.  She is using her Flonase, antihistamine and nasal saline.    Has her DEXAs done at Dr. Timmie Foerster office.  Continues on calcium, vitamin D and Prolia.  She is up-to-date on her bone density screening.  She continues to get regular weightbearing exercise    Hyperlipidemia-lipids were checked prior to last visit, total cholesterol was 225, triglycerides 82, HDL 132 and LDL 77.  She remains on atorvastatin.  She does not need to get labs done this time.    Hypertension-remains on amlodipine.  Tolerates this well.  Denies chest pain or shortness of breath.  Denies headache or dizziness  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    Ringing In The Ears (Tinnitus) H93.19    GERD (gastroesophageal reflux disease) K21.9    Pain in joint, ankle and foot M25.579    Weakness of foot M21.40    Hyperlipidemia E78.5     Current Outpatient Prescriptions   Medication    carBAMazepine (CARBATROL) 100 MG 12 hr capsule    atorvastatin (LIPITOR) 10 MG tablet    triamcinolone (KENALOG) 0.05 % ointment    RABEprazole (ACIPHEX) 20 MG tablet    levothyroxine (SYNTHROID, LEVOTHROID) 25 MCG tablet    darifenacin (ENABLEX) 15 MG 24 hr tablet    amLODIPine (NORVASC) 2.5 MG tablet    carBAMazepine (TEGRETOL XR) 200 MG 12 hr tablet    carBAMazepine (TEGRETOL XR) 100 MG 12 hr tablet    fluticasone (FLONASE) 50 MCG/ACT nasal spray    Calcium  Carb-Cholecalciferol 600-200 MG-UNIT TABS    GuaiFENesin (MUCINEX PO)    Denosumab (PROLIA SC)    loratadine (CLARITIN) 10 MG tablet    Multiple Vitamin (MULTIVITAMIN) per tablet    cholecalciferol (VITAMIN D) 1000 UNITS capsule    carBAMazepine (TEGRETOL XR) 200 MG 12 hr tablet    doxycycline (VIBRA-TABS) 100 MG tablet    azelastine (ASTELIN) 0.1 % nasal spray    ipratropium (ATROVENT HFA) 17 MCG/ACT inhaler     No current facility-administered medications for this visit.        Medication list reviewed and updated, no changes were made today    Exam:   Visit Vitals    BP 112/76    Pulse 72    Ht 1.6 m (5' 2.99")    Wt 53.7 kg (118 lb 6.4 oz)    BMI 20.98 kg/m2       A/P:  1.  Hypertension-meets JNC 8 blood pressure goal under 140/90 on low-dose amlodipine.  No changes were made today.  2.  Hyperlipidemia-well controlled on low-dose atorvastatin.  No changes were made today.  3.  Viral sinusitis-discussed with patient, would not start antibiotics as yet given that she has not had symptoms long enough and she does have a history of chronic sinusitis  and allergic rhinitis.  Continue conservative measures.  Given that she Has had so many recurrent infections, I was comfortable giving her prescription for doxycycline to reserve and if symptoms of discolored drainage beyond 7 days occur she may take the antibiotic.  4.  Osteoporosis-continue calcium, vitamin D, regular weightbearing exercise and prolia

## 2016-03-19 ENCOUNTER — Encounter: Payer: Self-pay | Admitting: Primary Care

## 2016-03-27 ENCOUNTER — Ambulatory Visit (INDEPENDENT_AMBULATORY_CARE_PROVIDER_SITE_OTHER): Payer: BLUE CROSS/BLUE SHIELD | Admitting: Interventional Cardiology

## 2016-03-27 ENCOUNTER — Encounter: Payer: Self-pay | Admitting: Interventional Cardiology

## 2016-03-27 ENCOUNTER — Other Ambulatory Visit: Payer: Self-pay | Admitting: Interventional Cardiology

## 2016-03-27 VITALS — BP 104/70 | HR 65 | Ht 66.5 in | Wt 168.0 lb

## 2016-03-27 DIAGNOSIS — Z8679 Personal history of other diseases of the circulatory system: Secondary | ICD-10-CM | POA: Diagnosis not present

## 2016-03-27 DIAGNOSIS — I10 Essential (primary) hypertension: Secondary | ICD-10-CM | POA: Diagnosis not present

## 2016-03-27 DIAGNOSIS — Z9889 Other specified postprocedural states: Secondary | ICD-10-CM

## 2016-03-27 MED ORDER — ASPIRIN 81 MG PO TABS: 81.0000 mg | ORAL_TABLET | Freq: Every day | ORAL | Status: AC

## 2016-03-27 NOTE — Patient Instructions (Signed)
Medication Instructions:  Your physician recommends that you continue on your current medications as directed. Please refer to the Current Medication list given to you today.   Labwork: None ordered  Testing/Procedures: None orderewd  Follow-Up: Your physician wants you to follow-up in: 1 year with Dr.Smith. You will receive a reminder letter in the mail two months in advance. If you don't receive a letter, please call our office to schedule the follow-up appointment.   Any Other Special Instructions Will Be Listed Below (If Applicable). Ok to take an additional 1/2 tablet of Metoprolol for break through PSVT     If you need a refill on your cardiac medications before your next appointment, please call your pharmacy.

## 2016-03-27 NOTE — Progress Notes (Signed)
Cardiology Office Note   Date:  03/27/2016   ID:  Teegan Kieffer, DOB 01-29-1956, MRN [id number]  PCP:  Lynne Logan, MD  Cardiologist:  Sinclair Grooms, MD   Chief Complaint  Patient presents with  . Mitral Valve Prolapse  . Atrial Fibrillation      History of Present Illness: Ariel Simpson is a 60 y.o. female who presents for Mitral valve prolapse, significant mitral regurgitation resulting in mitral valve repair in 2013, paroxysmal atrial fibrillation preceding surgery, and remote history of PSVT antedating mitral valve regurgitation by 20 years.  Ariel Simpson is doing well. 10 months ago while, in [place], she developed PSVT. It required a visit to the emergency room for an adenosine injection. Further analysis of the situation reveals that she missed metoprolol succinate the night prior to the episode. The episode occurred while exercising. Otherwise there've been no significant clinical events. She denies neurological complaints. She has not had syncope or chest pain. There are occasional palpitations but no prolonged tachycardia over the past 10 months.    Past Medical History  Diagnosis Date  . Mitral valve prolapse   . High cholesterol   . Heart murmur   . Severe mitral regurgitation 03/03/2013    Acute on chronic with acute heart failure   . Mitral regurgitation   . Paroxysmal supraventricular tachycardia ([place])   . Paroxysmal atrial fibrillation ([place]) 03/11/2013  . Shortness of breath   . PONV (postoperative nausea and vomiting)     Dizziness and Nausea  . CHF (congestive heart failure) ([place])   . Hypertension   . S/P mitral valve repair 03/18/2013    Complex valvuloplasty including triangular resection of posterior leaflet, artificial Gore-tex neocord placement x4 and 7mm Sorin Memo 3D ring annuloplasty via right mini thoracotomy  . S/P Maze operation for atrial fibrillation 03/18/2013    Complete bilateral atrial lesion set using cryothermy and biopolar  radiofrequency ablation with oversewing of LA appendage via right mini thoracotomy    Past Surgical History  Procedure Laterality Date  . Cardiac catheterization    . Wisdom tooth extraction    . Vericose veins    . Abdominal hysterectomy      hernia repair  . Mitral valve repair Right 03/18/2013    Procedure: MINIMALLY INVASIVE MITRAL VALVE REPAIR (MVR);  Surgeon: Rexene Alberts, MD;  Location: [place];  Service: Open Heart Surgery;  Laterality: Right;  . Intraoperative transesophageal echocardiogram N/A 03/18/2013    Procedure: INTRAOPERATIVE TRANSESOPHAGEAL ECHOCARDIOGRAM;  Surgeon: Rexene Alberts, MD;  Location: [place];  Service: Open Heart Surgery;  Laterality: N/A;  . Maze N/A 03/18/2013    Procedure: MAZE;  Surgeon: Rexene Alberts, MD;  Location: [facility];  Service: Open Heart Surgery;  Laterality: N/A;  . Left and right heart catheterization with coronary angiogram N/A 03/06/2013    Procedure: LEFT AND RIGHT HEART CATHETERIZATION WITH CORONARY ANGIOGRAM;  Surgeon: Sinclair Grooms, MD;  Location[facility] C[facility];  Service: Cardiovascular;  Laterality: N/A;     Current Outpatient Prescriptions  Medication Sig Dispense Refill  . aspirin 325 MG tablet Take 325 mg by mouth daily.    . Calcium Carbonate-Vitamin D (CALCIUM + D PO) Take 2 capsules by mouth daily.    Marland Kitchen GLUCOSA-CHONDR-NA CHONDR-MSM PO Take 2 capsules by mouth daily.    . metoprolol succinate (TOPROL-XL) 50 MG 24 hr tablet Take 1 tablet (50 mg total) by mouth daily. 90 tablet 3  . Omega-3 Fatty Acids (  FISH OIL PO) Take 2 capsules by mouth daily.    . polyethylene glycol (MIRALAX / GLYCOLAX) packet Take 17 g by mouth daily.    Marland Kitchen spironolactone-hydrochlorothiazide (ALDACTAZIDE) 25-25 MG tablet Take half (1/2) tablet by mouth daily.    Marland Kitchen zolpidem (AMBIEN) 5 MG tablet Take 5 mg by mouth at bedtime as needed for sleep.     Marland Kitchen atorvastatin (LIPITOR) 20 MG tablet Take 20 mg by mouth daily. Reported on 03/27/2016     No current  facility-administered medications for this visit.    Allergies:   Review of patient's allergies indicates no known allergies.    Social History:  The patient  reports that she has never smoked. She has never used smokeless tobacco. She reports that she drinks about 1.8 oz of alcohol per week. She reports that she does not use illicit drugs.   Family History:  The patient's family history includes Cancer in her father and mother.    ROS:  Please see the history of present illness.   Otherwise, review of systems are positive for Tightness in her neck with numbness in her arms if she raises her arms above her head while supine..   All other systems are reviewed and negative.    PHYSICAL EXAM: VS:  BP 104/70 mmHg  Pulse 65  Ht 5' 6.5" (1.689 m)  Wt 168 lb (76.204 kg)  BMI 26.71 kg/m2 , BMI Body mass index is 26.71 kg/(m^2). GEN: Well nourished, well developed, in no acute distress HEENT: normal Neck: no JVD, carotid bruits, or masses Cardiac: RRR.  There is no murmur, rub, or gallop. There is no edema. Respiratory:  clear to auscultation bilaterally, normal work of breathing. GI: soft, nontender, nondistended, + BS MS: no deformity or atrophy Skin: warm and dry, no rash Neuro:  Strength and sensation are intact Psych: euthymic mood, full affect   EKG:  EKG is ordered today. The ekg performed in August 2016 during tachycardia revealed SVT at 160 bpm. No EKG since that time.   Recent Labs: No results found for requested labs within last 365 days.    Lipid Panel No results found for: CHOL, TRIG, HDL, CHOLHDL, VLDL, LDLCALC, LDLDIRECT    Wt Readings from Last 3 Encounters:  03/27/16 168 lb (76.204 kg)  03/29/15 160 lb (72.576 kg)  02/24/15 161 lb 1.9 oz (73.084 kg)      Other studies Reviewed: Additional studies/ records that were reviewed today include: PSVT commencing after a missed dose of metoprolol succinate. The findings include resolved with  adenosine.    ASSESSMENT AND PLAN:  1. S/P mitral valve repair No clinical evidence of mitral valve regurgitation.  2. S/P Maze operation for atrial fibrillation No recurrence of atrial fibrillation. Had maze procedure performed at the time of surgery.  3. Essential hypertension Excellent control  4. PSVT, resolved with IV adenosine in August 2016 This was the second such episode in the patient's lifetime. The previous episode was greater than 20 years ago.    Current medicines are reviewed at length with the patient today.  The patient has the following concerns regarding medicines: .  The following changes/actions have been instituted:    We discussed PSVT and treatment options. In this occasion it likely occurred because of a missed beta blocker dose greater than 24 hours prior to the episode.  We discussed having metoprolol titrate available for acute use if recurrent episodes. We will use this in the future if she has recurrent episodes on  her chronic metoprolol succinate regimen. A new prescription for metoprolol tartrate is not given at this time.  Encouraged physical activity  We discussed the possibility of ablation if PSVT continues to be a problem in the future  Labs/ tests ordered today include:  No orders of the defined types were placed in this encounter.     Disposition:   FU with HS in 1 year  Signed, Sinclair Grooms, MD  03/27/2016 8:57 AM    [facility] [address], [place], [place]  [address] Phone: [phone]; Fax: [phone]

## 2016-04-07 ENCOUNTER — Encounter: Payer: Self-pay | Admitting: Gastroenterology

## 2016-04-18 ENCOUNTER — Telehealth: Payer: Self-pay | Admitting: Neurology

## 2016-04-18 NOTE — Telephone Encounter (Signed)
Dr. Leary Roca is calling, the patient Pediatrist, and would like to speak with Dr. Johna Sheriff regarding the patient.  She can be reached back at [phone]  Paged Dr. Johna Sheriff

## 2016-04-21 ENCOUNTER — Ambulatory Visit: Payer: Self-pay | Admitting: Primary Care

## 2016-04-26 ENCOUNTER — Ambulatory Visit: Payer: Self-pay | Admitting: Primary Care

## 2016-04-26 ENCOUNTER — Encounter: Payer: Self-pay | Admitting: Primary Care

## 2016-04-26 VITALS — BP 120/70 | HR 60 | Ht 62.9 in | Wt 118.0 lb

## 2016-04-26 DIAGNOSIS — G629 Polyneuropathy, unspecified: Secondary | ICD-10-CM

## 2016-04-26 DIAGNOSIS — H9202 Otalgia, left ear: Secondary | ICD-10-CM

## 2016-04-26 NOTE — Progress Notes (Signed)
Reason for Visit: Otalgia (left) and neuropathy (Bilateral feet)    Pt went to see Dr. Leary Roca for orthotics  She told her that she wouldn't do orthotics for her and she had a neuropathy and needed to see her neurologist  Pt reports that she has no new sx in her feet and feels that this may be chronic changes.  She denies any neuropathic pain.  Denies any new weakness or numbness    She does have left ear pain and given history of sinusitis and otitis in the past, wants it evaluated.  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    Ringing In The Ears (Tinnitus) H93.19    GERD (gastroesophageal reflux disease) K21.9    Pain in joint, ankle and foot M25.579    Weakness of foot M21.40    Hyperlipidemia E78.5     Current Outpatient Prescriptions   Medication    ranitidine (ZANTAC) 150 MG tablet    doxycycline (VIBRA-TABS) 100 MG tablet    carBAMazepine (CARBATROL) 100 MG 12 hr capsule    atorvastatin (LIPITOR) 10 MG tablet    triamcinolone (KENALOG) 0.05 % ointment    RABEprazole (ACIPHEX) 20 MG tablet    levothyroxine (SYNTHROID, LEVOTHROID) 25 MCG tablet    darifenacin (ENABLEX) 15 MG 24 hr tablet    amLODIPine (NORVASC) 2.5 MG tablet    azelastine (ASTELIN) 0.1 % nasal spray    carBAMazepine (TEGRETOL XR) 200 MG 12 hr tablet    fluticasone (FLONASE) 50 MCG/ACT nasal spray    Calcium Carb-Cholecalciferol 600-200 MG-UNIT TABS    GuaiFENesin (MUCINEX PO)    Denosumab (PROLIA SC)     loratadine (CLARITIN) 10 MG tablet    Multiple Vitamin (MULTIVITAMIN) per tablet    cholecalciferol (VITAMIN D) 1000 UNITS capsule    ipratropium (ATROVENT HFA) 17 MCG/ACT inhaler     No current facility-administered medications for this visit.        Medication list reviewed and updated, no changes were made today    Exam:   Visit Vitals    BP 120/70    Pulse 60    Ht 1.598 m (5' 2.9")    Wt 53.5 kg (118 lb)    BMI 20.97 kg/m2   left ear with slight serous effusion, but no erythema or exudate  No cervical adenopathy  No mastoid tenderness    RIght foot:   Nl pulses  Normal monofilament dorsal and plantar  Reduced vibration just at the toes, normal to the distal metatarsal--sensation does not feel like vibration--  Normal pinprick    Left foot:  Nl pulses  Nomral monofilament dorsal and plantar aspect  Reduced vibration at the great toe, normal throughout the rest of the foot    A/P:  1.  Slightly abnormal sensation in feet--reduced vibration sense right worse than left. I will reach out  to Dr. Johna Sheriff.  Discussed EMG, but I don't think it would change our management.  We certainly can check UPEP/SPEP, B12, TSH and A1C as screening for causes of neuropathy.  Functionally she is improving with the PT, my inclination is to do nothing  2.  Pt will still  Need orthotics and we will need to explore where she can get these made  3. Left ear pain--does not appear infected.  No need for antibiotics  Continue chronic allergy meds

## 2016-05-01 ENCOUNTER — Other Ambulatory Visit: Payer: Self-pay | Admitting: Neurology

## 2016-05-01 ENCOUNTER — Telehealth: Payer: Self-pay | Admitting: Neurology

## 2016-05-01 MED ORDER — CARBAMAZEPINE 200 MG PO TB12 *I*
400.0000 mg | ORAL_TABLET | Freq: Two times a day (BID) | ORAL | 3 refills | Status: DC
Start: 2016-05-01 — End: 2016-11-09

## 2016-05-01 NOTE — Telephone Encounter (Signed)
Spoke to patient. She wanted to be sure that Dr. Johna Sheriff had spoken with Dr. Cloyde Reams and that he was in agreement that further follow up for her feet was not needed at this point. Discussed that he had spoken with Dr. Cloyde Reams.  Patient does not feel she needs to be seen.  Jaquilla Woodroof Skipper Cliche, NP

## 2016-05-01 NOTE — Telephone Encounter (Signed)
Patient is calling to discuss ongoing issues with her feet.  Please call.

## 2016-05-01 NOTE — Telephone Encounter (Signed)
The insurance company wont refill the prescription because its been being filled for "1 tablet 2 times daily" but she takes it as "2 tablets 2 times daily"  The way we have it written now is the correct way so the patient thinks it should be okay with insurance but wants to make sure it is sent over for 2 tablets instead of 1

## 2016-05-12 ENCOUNTER — Encounter (HOSPITAL_COMMUNITY): Payer: Self-pay | Admitting: *Deleted

## 2016-05-17 ENCOUNTER — Telehealth: Payer: Self-pay | Admitting: Primary Care

## 2016-05-17 NOTE — Telephone Encounter (Signed)
In my outbox.

## 2016-05-17 NOTE — Telephone Encounter (Signed)
Needs rx for orthotics, script should read bilat foot inserts for pain.  Please mail to her thanks

## 2016-05-29 ENCOUNTER — Encounter: Payer: Self-pay | Admitting: Gastroenterology

## 2016-06-02 ENCOUNTER — Ambulatory Visit: Payer: Self-pay | Admitting: Primary Care

## 2016-06-12 ENCOUNTER — Other Ambulatory Visit
Admission: RE | Admit: 2016-06-12 | Discharge: 2016-06-12 | Disposition: A | Discharge status: Home / Self Care | Payer: Self-pay | Source: Ambulatory Visit

## 2016-06-12 DIAGNOSIS — Z5181 Encounter for therapeutic drug level monitoring: Secondary | ICD-10-CM

## 2016-06-12 DIAGNOSIS — Z79899 Other long term (current) drug therapy: Secondary | ICD-10-CM

## 2016-06-12 DIAGNOSIS — E063 Autoimmune thyroiditis: Secondary | ICD-10-CM

## 2016-06-12 DIAGNOSIS — E7800 Pure hypercholesterolemia, unspecified: Secondary | ICD-10-CM

## 2016-06-12 DIAGNOSIS — E038 Other specified hypothyroidism: Secondary | ICD-10-CM

## 2016-06-12 LAB — COMPREHENSIVE METABOLIC PANEL
ALT: 29 U/L (ref 0–35)
AST: 20 U/L (ref 0–35)
Albumin: 4.6 g/dL (ref 3.5–5.2)
Alk Phos: 69 U/L (ref 35–105)
Anion Gap: 17 — ABNORMAL HIGH (ref 7–16)
Bilirubin,Total: <0.2 mg/dL (ref 0.0–1.2)
CO2: 25 mmol/L (ref 20–28)
Calcium: 9.5 mg/dL (ref 8.6–10.2)
Chloride: 100 mmol/L (ref 96–108)
Creatinine: 0.7 mg/dL (ref 0.51–0.95)
GFR,Black: 107 *
GFR,Caucasian: 93 *
Glucose: 95 mg/dL (ref 60–99)
Lab: 22 mg/dL — ABNORMAL HIGH (ref 6–20)
Potassium: 4.8 mmol/L (ref 3.3–5.1)
Sodium: 142 mmol/L (ref 133–145)
Total Protein: 7.1 g/dL (ref 6.3–7.7)

## 2016-06-12 LAB — TSH: TSH: 1.84 u[IU]/mL (ref 0.27–4.20)

## 2016-06-12 LAB — CBC
Hematocrit: 39 % (ref 34–45)
Hemoglobin: 12.5 g/dL (ref 11.2–15.7)
MCH: 32 pg/cell (ref 26–32)
MCHC: 32 g/dL (ref 32–36)
MCV: 101 fL — ABNORMAL HIGH (ref 79–95)
Platelets: 237 10*3/uL (ref 160–370)
RBC: 3.9 MIL/uL (ref 3.9–5.2)
RDW: 13.3 % (ref 11.7–14.4)
WBC: 6.5 10*3/uL (ref 4.0–10.0)

## 2016-06-12 LAB — LIPID PANEL
Chol/HDL Ratio: 2
Cholesterol: 229 mg/dL — AB
HDL: 112 mg/dL
LDL Calculated: 100 mg/dL
Non HDL Cholesterol: 117 mg/dL
Triglycerides: 84 mg/dL

## 2016-06-12 LAB — IRON: Iron: 52 ug/dL (ref 34–165)

## 2016-06-12 LAB — MULTIPLE ORDERING DOCS

## 2016-06-12 LAB — VITAMIN B12: Vitamin B12: 886 pg/mL (ref 211–946)

## 2016-06-12 LAB — MAGNESIUM: Magnesium: 1.8 mEq/L (ref 1.3–2.1)

## 2016-06-13 LAB — CARBAMAZEPINE, FREE & TOTAL
Carbamazepine, Free: 20.4 % (ref 8.0–35.0)
Carbamazepine,Free: 2.1 ug/mL (ref 1.0–3.0)
Carbamazepine,Total: 10.3 ug/mL (ref 4.0–12.0)

## 2016-06-15 LAB — VITAMIN D
25-OH VIT D2: <4 ng/mL
25-OH VIT D3: 57 ng/mL
25-OH Vit Total: 57 ng/mL (ref 30–60)

## 2016-06-16 ENCOUNTER — Telehealth: Payer: Self-pay | Admitting: Internal Medicine

## 2016-06-16 NOTE — Telephone Encounter (Signed)
Rec'd from [facility] @ [employer] forward 70 pages to Dr. Quay Burow

## 2016-06-25 ENCOUNTER — Other Ambulatory Visit: Payer: Self-pay | Admitting: Interventional Cardiology

## 2016-06-25 DIAGNOSIS — I10 Essential (primary) hypertension: Secondary | ICD-10-CM

## 2016-07-04 ENCOUNTER — Inpatient Hospital Stay (HOSPITAL_COMMUNITY)
Admission: RE | Admit: 2016-07-04 | Payer: BLUE CROSS/BLUE SHIELD | Source: Ambulatory Visit | Admitting: Nurse Practitioner

## 2016-07-05 ENCOUNTER — Ambulatory Visit (HOSPITAL_COMMUNITY)
Admission: RE | Admit: 2016-07-05 | Discharge: 2016-07-05 | Disposition: A | Discharge status: Home / Self Care | Payer: BLUE CROSS/BLUE SHIELD | Source: Ambulatory Visit | Attending: Nurse Practitioner | Admitting: Nurse Practitioner

## 2016-07-05 ENCOUNTER — Encounter (HOSPITAL_COMMUNITY): Payer: Self-pay | Admitting: Nurse Practitioner

## 2016-07-05 ENCOUNTER — Other Ambulatory Visit (HOSPITAL_COMMUNITY): Payer: Self-pay | Admitting: *Deleted

## 2016-07-05 VITALS — BP 112/80 | Ht 66.5 in | Wt 168.6 lb

## 2016-07-05 DIAGNOSIS — Z9889 Other specified postprocedural states: Secondary | ICD-10-CM | POA: Diagnosis not present

## 2016-07-05 DIAGNOSIS — Z8679 Personal history of other diseases of the circulatory system: Secondary | ICD-10-CM

## 2016-07-05 DIAGNOSIS — Z7982 Long term (current) use of aspirin: Secondary | ICD-10-CM | POA: Diagnosis not present

## 2016-07-05 DIAGNOSIS — I4891 Unspecified atrial fibrillation: Secondary | ICD-10-CM | POA: Insufficient documentation

## 2016-07-05 DIAGNOSIS — E7800 Pure hypercholesterolemia, unspecified: Secondary | ICD-10-CM | POA: Diagnosis not present

## 2016-07-05 DIAGNOSIS — Z79899 Other long term (current) drug therapy: Secondary | ICD-10-CM | POA: Insufficient documentation

## 2016-07-05 NOTE — Progress Notes (Signed)
Primary Care Physician: Lynne Logan, MD Referring Physician: Dr. Roxy Manns Cardiologist: Dr. Linard Millers   Ariel Simpson is a 60 y.o. female with a h/o MR repair and MAZE procedure that is here for long term maze surveillance. She reports that she has done very well since her surgery and has a rare palpitation but is not aware of any sustained tachyarrythmia. She is very active doing yoga and feels great.  Today, she denies symptoms of palpitations, chest pain, shortness of breath, orthopnea, PND, lower extremity edema, dizziness, presyncope, syncope, or neurologic sequela. The patient is tolerating medications without difficulties and is otherwise without complaint today.   Past Medical History:  Diagnosis Date  . CHF (congestive heart failure) ([place])   . Heart murmur   . High cholesterol   . Hypertension   . Mitral regurgitation   . Mitral valve prolapse   . Paroxysmal atrial fibrillation ([place]) 03/11/2013  . Paroxysmal supraventricular tachycardia ([place])   . PONV (postoperative nausea and vomiting)    Dizziness and Nausea  . S/P Maze operation for atrial fibrillation 03/18/2013   Complete bilateral atrial lesion set using cryothermy and biopolar radiofrequency ablation with oversewing of LA appendage via right mini thoracotomy  . S/P mitral valve repair 03/18/2013   Complex valvuloplasty including triangular resection of posterior leaflet, artificial Gore-tex neocord placement x4 and 70mm Sorin Memo 3D ring annuloplasty via right mini thoracotomy  . Severe mitral regurgitation 03/03/2013   Acute on chronic with acute heart failure   . Shortness of breath    Past Surgical History:  Procedure Laterality Date  . ABDOMINAL HYSTERECTOMY     hernia repair  . CARDIAC CATHETERIZATION    . INTRAOPERATIVE TRANSESOPHAGEAL ECHOCARDIOGRAM N/A 03/18/2013   Procedure: INTRAOPERATIVE TRANSESOPHAGEAL ECHOCARDIOGRAM;  Surgeon: Rexene Alberts, MD;  Location: [place];  Service: Open Heart Surgery;   Laterality: N/A;  . LEFT AND RIGHT HEART CATHETERIZATION WITH CORONARY ANGIOGRAM N/A 03/06/2013   Procedure: LEFT AND RIGHT HEART CATHETERIZATION WITH CORONARY ANGIOGRAM;  Surgeon: Sinclair Grooms, MD;  Location: [facility] CATH LAB;  Service: Cardiovascular;  Laterality: N/A;  . MAZE N/A 03/18/2013   Procedure: MAZE;  Surgeon: Rexene Alberts, MD;  Location: [place];  Service: Open Heart Surgery;  Laterality: N/A;  . MITRAL VALVE REPAIR Right 03/18/2013   Procedure: MINIMALLY INVASIVE MITRAL VALVE REPAIR (MVR);  Surgeon: Rexene Alberts, MD;  Location: [place];  Service: Open Heart Surgery;  Laterality: Right;  . vericose veins    . WISDOM TOOTH EXTRACTION      Current Outpatient Prescriptions  Medication Sig Dispense Refill  . aspirin 81 MG tablet Take 1 tablet (81 mg total) by mouth daily.    . Calcium Carbonate-Vitamin D (CALCIUM + D PO) Take 2 capsules by mouth daily.    Marland Kitchen GLUCOSA-CHONDR-NA CHONDR-MSM PO Take 2 capsules by mouth daily.    . metoprolol succinate (TOPROL-XL) 50 MG 24 hr tablet Take 1 tablet (50 mg total) by mouth daily. 90 tablet 3  . Omega-3 Fatty Acids (FISH OIL PO) Take 2 capsules by mouth daily.    . polyethylene glycol (MIRALAX / GLYCOLAX) packet Take 17 g by mouth daily.    Marland Kitchen spironolactone-hydrochlorothiazide (ALDACTAZIDE) 25-25 MG tablet TAKE ONE-HALF (1/2) TABLET DAILY 45 tablet 2  . zolpidem (AMBIEN) 5 MG tablet Take 5 mg by mouth at bedtime as needed for sleep.      No current facility-administered medications for this encounter.     No Known  Allergies  Social History   Social History  . Marital status: Married    Spouse name: Fritz Pickerel  . Number of children: 3  . Years of education: BS   Occupational History  .  [employer]   Social History Main Topics  . Smoking status: Never Smoker  . Smokeless tobacco: Never Used  . Alcohol use 1.8 oz/week    3 Glasses of wine per week     Comment: occasional  . Drug use: No  . Sexual activity: Yes   Other  Topics Concern  . Not on file   Social History Narrative   Patient lives at home with her husband  Fritz Pickerel). Patient has college education B.S. 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   Caffeine- one cup daily.    Family History  Problem Relation Age of Onset  . Cancer Mother   . Cancer Father     ROS- All systems are reviewed and negative except as per the HPI above  Physical Exam: Vitals:   07/05/16 0846  BP: 112/80  Weight: 168 lb 9.6 oz (76.5 kg)  Height: 5' 6.5" (1.689 m)    GEN- The patient is well appearing, alert and oriented x 3 today.   Head- normocephalic, atraumatic Eyes-  Sclera clear, conjunctiva pink Ears- hearing intact Oropharynx- clear Neck- supple, no JVP Lymph- no cervical lymphadenopathy Lungs- Clear to ausculation bilaterally, normal work of breathing Heart- Regular rate and rhythm, no murmurs, rubs or gallops, PMI not laterally displaced GI- soft, NT, ND, + BS Extremities- no clubbing, cyanosis, or edema MS- no significant deformity or atrophy Skin- no rash or lesion Psych- euthymic mood, full affect Neuro- strength and sensation are intact  EKG-NSR normal EKG Epic records reviewed  Assessment and Plan: 1. S/p mitral valve repair with afib/Maze procedure Pt is doing well s/p surgery and feels well No evidence of recurrent afib  Continue toprol and asa Prophylactic antibiotics prior to dental cleaning  F/u one year  Butch Penny C. Khriz Liddy, [facility] [address] [place], [place] [address] [phone]

## 2016-07-10 ENCOUNTER — Encounter: Payer: Self-pay | Admitting: Gastroenterology

## 2016-07-13 ENCOUNTER — Other Ambulatory Visit: Payer: Self-pay | Admitting: Primary Care

## 2016-07-13 MED ORDER — AZELASTINE HCL 137 MCG/SPRAY NA SOLN *I*
2.0000 | Freq: Two times a day (BID) | NASAL | 3 refills | Status: DC
Start: 2016-07-13 — End: 2016-08-07

## 2016-07-18 DIAGNOSIS — I471 Supraventricular tachycardia, unspecified: Secondary | ICD-10-CM | POA: Insufficient documentation

## 2016-07-18 DIAGNOSIS — Z9889 Other specified postprocedural states: Secondary | ICD-10-CM | POA: Insufficient documentation

## 2016-07-19 ENCOUNTER — Other Ambulatory Visit: Payer: Self-pay | Admitting: Primary Care

## 2016-07-19 MED ORDER — FLUTICASONE-SALMETEROL 250-50 MCG/ACT IN AEPB *I*
1.0000 | INHALATION_SPRAY | Freq: Two times a day (BID) | RESPIRATORY_TRACT | 5 refills | Status: AC
Start: 2016-07-19 — End: 2017-01-15

## 2016-07-25 ENCOUNTER — Encounter: Payer: Self-pay | Admitting: Primary Care

## 2016-08-04 ENCOUNTER — Telehealth: Payer: Self-pay | Admitting: Neurology

## 2016-08-04 NOTE — Telephone Encounter (Signed)
Neurology After Hours Call:    [name] reports that she is a patient of Dr. Johna Sheriff. 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 She states that for a period of time afterwards until she was placed on Tegretol form Depakote she had seizures which involved a short period of aphasia. She has not had these in over ten years. About one hour ago she began to get very shaky. Occasionally she has low blood sugar. She ate something and the shaking improved. Her blood glucose was 195. This is somewhat similar to the shaking that she had with her seizures but the main symptom is her aphasia. There are no large shaking movements but she does have a small tremor through her body. She has not had recent medications changes. She is on Tegretol 500 mg BID. She has never had symptoms like this before. She also notes that her anxiety spiked when it started making it worse.     I recommended to monitor the symptoms for now, as there are no clear focal deficits and she seems mildly tremulous. This could potentially be from a combination of medications (Tegretol) or related to blood sugar. I advised her if the symptoms persist or worsen, that she should call us back or come to the ED.    Magdalene River, MD  Neurology PGY3

## 2016-08-05 ENCOUNTER — Other Ambulatory Visit
Admission: RE | Admit: 2016-08-05 | Discharge: 2016-08-05 | Disposition: A | Discharge status: Home / Self Care | Payer: Self-pay | Source: Ambulatory Visit | Attending: Primary Care | Admitting: Primary Care

## 2016-08-05 ENCOUNTER — Telehealth: Payer: Self-pay | Admitting: Primary Care

## 2016-08-05 DIAGNOSIS — R251 Tremor, unspecified: Secondary | ICD-10-CM

## 2016-08-05 LAB — COMPREHENSIVE METABOLIC PANEL
ALT: 27 U/L (ref 0–35)
AST: 18 U/L (ref 0–35)
Albumin: 4.6 g/dL (ref 3.5–5.2)
Alk Phos: 62 U/L (ref 35–105)
Anion Gap: 14 (ref 7–16)
Bilirubin,Total: 0.3 mg/dL (ref 0.0–1.2)
CO2: 29 mmol/L — ABNORMAL HIGH (ref 20–28)
Calcium: 10.1 mg/dL (ref 8.6–10.2)
Chloride: 100 mmol/L (ref 96–108)
Creatinine: 0.76 mg/dL (ref 0.51–0.95)
GFR,Black: 97 *
GFR,Caucasian: 84 *
Glucose: 111 mg/dL — ABNORMAL HIGH (ref 60–99)
Lab: 19 mg/dL (ref 6–20)
Potassium: 4.8 mmol/L (ref 3.3–5.1)
Sodium: 143 mmol/L (ref 133–145)
Total Protein: 7.1 g/dL (ref 6.3–7.7)

## 2016-08-05 LAB — CBC AND DIFFERENTIAL
Baso # K/uL: 0 10*3/uL (ref 0.0–0.1)
Basophil %: 0.1 %
Eos # K/uL: 0 10*3/uL (ref 0.0–0.4)
Eosinophil %: 0.3 %
Hematocrit: 39 % (ref 34–45)
Hemoglobin: 12.6 g/dL (ref 11.2–15.7)
IMM Granulocytes #: 0 10*3/uL (ref 0.0–0.1)
IMM Granulocytes: 0.1 %
Lymph # K/uL: 1.5 10*3/uL (ref 1.2–3.7)
Lymphocyte %: 22.2 %
MCH: 32 pg/cell (ref 26–32)
MCHC: 32 g/dL (ref 32–36)
MCV: 99 fL — ABNORMAL HIGH (ref 79–95)
Mono # K/uL: 0.6 10*3/uL (ref 0.2–0.9)
Monocyte %: 8.7 %
Neut # K/uL: 4.6 10*3/uL (ref 1.6–6.1)
Nucl RBC # K/uL: 0 10*3/uL (ref 0.0–0.0)
Nucl RBC %: 0 /100 WBC (ref 0.0–0.2)
Platelets: 250 10*3/uL (ref 160–370)
RBC: 4 MIL/uL (ref 3.9–5.2)
RDW: 13.5 % (ref 11.7–14.4)
Seg Neut %: 68.6 %
WBC: 6.7 10*3/uL (ref 4.0–10.0)

## 2016-08-05 LAB — URINALYSIS WITH MICROSCOPIC
Blood,UA: NEGATIVE
Ketones, UA: NEGATIVE
Nitrite,UA: NEGATIVE
Protein,UA: NEGATIVE mg/dL
RBC,UA: <1 /hpf (ref 0–2)
Specific Gravity,UA: 1.011 (ref 1.002–1.030)
WBC,UA: <1 /hpf (ref 0–5)
pH,UA: 7 (ref 5.0–8.0)

## 2016-08-05 LAB — CK: CK: 60 U/L (ref 34–145)

## 2016-08-05 LAB — TSH: TSH: 1.67 u[IU]/mL (ref 0.27–4.20)

## 2016-08-05 NOTE — Telephone Encounter (Signed)
Pt feels shaky  Has been present since yesterday evenning  Did not note it when she got up in the middle of the night      133/85  80    Wonders about infection, UTI or carbamazepine level being high  Will check labs.

## 2016-08-06 ENCOUNTER — Other Ambulatory Visit: Payer: Self-pay | Admitting: Primary Care

## 2016-08-06 LAB — AEROBIC CULTURE: Aerobic Culture: 0

## 2016-08-08 ENCOUNTER — Ambulatory Visit: Payer: Self-pay | Admitting: Primary Care

## 2016-08-08 ENCOUNTER — Encounter: Payer: Self-pay | Admitting: Gastroenterology

## 2016-08-08 ENCOUNTER — Encounter: Payer: Self-pay | Admitting: Primary Care

## 2016-08-08 ENCOUNTER — Other Ambulatory Visit (INDEPENDENT_AMBULATORY_CARE_PROVIDER_SITE_OTHER): Payer: BLUE CROSS/BLUE SHIELD

## 2016-08-08 ENCOUNTER — Ambulatory Visit (INDEPENDENT_AMBULATORY_CARE_PROVIDER_SITE_OTHER): Payer: BLUE CROSS/BLUE SHIELD | Admitting: Internal Medicine

## 2016-08-08 ENCOUNTER — Encounter: Payer: Self-pay | Admitting: Internal Medicine

## 2016-08-08 VITALS — BP 130/86 | HR 66 | Ht 62.91 in | Wt 113.0 lb

## 2016-08-08 VITALS — BP 118/80 | HR 55 | Temp 98.0°F | Resp 16 | Ht 67.0 in | Wt 168.0 lb

## 2016-08-08 DIAGNOSIS — G4700 Insomnia, unspecified: Secondary | ICD-10-CM | POA: Diagnosis not present

## 2016-08-08 DIAGNOSIS — R251 Tremor, unspecified: Secondary | ICD-10-CM

## 2016-08-08 DIAGNOSIS — E785 Hyperlipidemia, unspecified: Secondary | ICD-10-CM | POA: Insufficient documentation

## 2016-08-08 DIAGNOSIS — I10 Essential (primary) hypertension: Secondary | ICD-10-CM

## 2016-08-08 DIAGNOSIS — Z0000 Encounter for general adult medical examination without abnormal findings: Secondary | ICD-10-CM

## 2016-08-08 DIAGNOSIS — E034 Atrophy of thyroid (acquired): Secondary | ICD-10-CM

## 2016-08-08 DIAGNOSIS — E7800 Pure hypercholesterolemia, unspecified: Secondary | ICD-10-CM

## 2016-08-08 DIAGNOSIS — J0100 Acute maxillary sinusitis, unspecified: Secondary | ICD-10-CM

## 2016-08-08 LAB — CBC WITH DIFFERENTIAL/PLATELET
BASOS PCT: 0.4 % (ref 0.0–3.0)
Basophils Absolute: 0 10*3/uL (ref 0.0–0.1)
EOS PCT: 1.5 % (ref 0.0–5.0)
Eosinophils Absolute: 0.1 10*3/uL (ref 0.0–0.7)
HEMATOCRIT: 40.4 % (ref 36.0–46.0)
HEMOGLOBIN: 13.9 g/dL (ref 12.0–15.0)
LYMPHS PCT: 34.5 % (ref 12.0–46.0)
Lymphs Abs: 1.8 10*3/uL (ref 0.7–4.0)
MCHC: 34.5 g/dL (ref 30.0–36.0)
MCV: 90.1 fl (ref 78.0–100.0)
MONO ABS: 0.4 10*3/uL (ref 0.1–1.0)
MONOS PCT: 7.9 % (ref 3.0–12.0)
Neutro Abs: 3 10*3/uL (ref 1.4–7.7)
Neutrophils Relative %: 55.7 % (ref 43.0–77.0)
Platelets: 215 10*3/uL (ref 150.0–400.0)
RBC: 4.48 Mil/uL (ref 3.87–5.11)
RDW: 13.1 % (ref 11.5–15.5)
WBC: 5.4 10*3/uL (ref 4.0–10.5)

## 2016-08-08 LAB — LIPID PANEL
Cholesterol: 280 mg/dL — ABNORMAL HIGH (ref 0–200)
HDL: 54.6 mg/dL
LDL Cholesterol: 195 mg/dL — ABNORMAL HIGH (ref 0–99)
NonHDL: 225.2
Total CHOL/HDL Ratio: 5
Triglycerides: 151 mg/dL — ABNORMAL HIGH (ref 0.0–149.0)
VLDL: 30.2 mg/dL (ref 0.0–40.0)

## 2016-08-08 LAB — COMPREHENSIVE METABOLIC PANEL WITH GFR
ALT: 17 U/L (ref 0–35)
AST: 19 U/L (ref 0–37)
Albumin: 4.4 g/dL (ref 3.5–5.2)
Alkaline Phosphatase: 68 U/L (ref 39–117)
BUN: 19 mg/dL (ref 6–23)
CO2: 32 meq/L (ref 19–32)
Calcium: 9.8 mg/dL (ref 8.4–10.5)
Chloride: 103 meq/L (ref 96–112)
Creatinine, Ser: 0.95 mg/dL (ref 0.40–1.20)
GFR: 63.21 mL/min
Glucose, Bld: 95 mg/dL (ref 70–99)
Potassium: 5 meq/L (ref 3.5–5.1)
Sodium: 140 meq/L (ref 135–145)
Total Bilirubin: 0.6 mg/dL (ref 0.2–1.2)
Total Protein: 7.2 g/dL (ref 6.0–8.3)

## 2016-08-08 LAB — TSH: TSH: 2.42 u[IU]/mL (ref 0.35–4.50)

## 2016-08-08 MED ORDER — DOXYCYCLINE HYCLATE 100 MG PO TABS *I*
100.0000 mg | ORAL_TABLET | Freq: Two times a day (BID) | ORAL | 0 refills | Status: DC
Start: 2016-08-08 — End: 2017-03-20

## 2016-08-08 MED ORDER — ZOLPIDEM TARTRATE 5 MG PO TABS: 2.5000 mg | ORAL_TABLET | Freq: Every evening | ORAL | 0 refills | Status: DC | PRN

## 2016-08-08 NOTE — Patient Instructions (Signed)
Test(s) ordered today. Your results will be released to [place] (or called to you) after review, usually within 72hours after test completion. If any changes need to be made, you will be notified at that same time.  All other Health Maintenance issues reviewed.   All recommended immunizations and age-appropriate screenings are up-to-date or discussed.  No immunizations administered today.   Medications reviewed and updated.  No changes recommended at this time.  Your prescription(s) have been submitted to your pharmacy. Please take as directed and contact our office if you believe you are having problem(s) with the medication(s).   Please followup in one year   Health Maintenance, Female Adopting a healthy lifestyle and getting preventive care can go a long way to promote health and wellness. Talk with your health care provider about what schedule of regular examinations is right for you. This is a good chance for you to check in with your provider about disease prevention and staying healthy. In between checkups, there are plenty of things you can do on your own. Experts have done a lot of research about which lifestyle changes and preventive measures are most likely to keep you healthy. Ask your health care provider for more information. WEIGHT AND DIET  Eat a healthy diet  Be sure to include plenty of vegetables, fruits, low-fat dairy products, and lean protein.  Do not eat a lot of foods high in solid fats, added sugars, or salt.  Get regular exercise. This is one of the most important things you can do for your health.  Most adults should exercise for at least 150 minutes each week. The exercise should increase your heart rate and make you sweat (moderate-intensity exercise).  Most adults should also do strengthening exercises at least twice a week. This is in addition to the moderate-intensity exercise.  Maintain a healthy weight  Body mass index (BMI) is a measurement that  can be used to identify possible weight problems. It estimates body fat based on height and weight. Your health care provider can help determine your BMI and help you achieve or maintain a healthy weight.  For females 31 years of age and older:   A BMI below 18.5 is considered underweight.  A BMI of 18.5 to 24.9 is normal.  A BMI of 25 to 29.9 is considered overweight.  A BMI of 30 and above is considered obese.  Watch levels of cholesterol and blood lipids  You should start having your blood tested for lipids and cholesterol at 62 years of age, then have this test every 5 years.  You may need to have your cholesterol levels checked more often if:  Your lipid or cholesterol levels are high.  You are older than 62 years of age.  You are at high risk for heart disease.  CANCER SCREENING   Lung Cancer  Lung cancer screening is recommended for adults 76-31 years old who are at high risk for lung cancer because of a history of smoking.  A yearly low-dose CT scan of the lungs is recommended for people who:  Currently smoke.  Have quit within the past 15 years.  Have at least a 30-pack-year history of smoking. A pack year is smoking an average of one pack of cigarettes a day for 1 year.  Yearly screening should continue until it has been 15 years since you quit.  Yearly screening should stop if you develop a health problem that would prevent you from having lung cancer treatment.  Breast Cancer  Practice breast self-awareness. This means understanding how your breasts normally appear and feel.  It also means doing regular breast self-exams. Let your health care provider know about any changes, no matter how small.  If you are in your 20s or 30s, you should have a clinical breast exam (CBE) by a health care provider every 1-3 years as part of a regular health exam.  If you are 78 or older, have a CBE every year. Also consider having a breast X-ray (mammogram) every  year.  If you have a family history of breast cancer, talk to your health care provider about genetic screening.  If you are at high risk for breast cancer, talk to your health care provider about having an MRI and a mammogram every year.  Breast cancer gene (BRCA) assessment is recommended for women who have family members with BRCA-related cancers. BRCA-related cancers include:  Breast.  Ovarian.  Tubal.  Peritoneal cancers.  Results of the assessment will determine the need for genetic counseling and BRCA1 and BRCA2 testing. Cervical Cancer Your health care provider may recommend that you be screened regularly for cancer of the pelvic organs (ovaries, uterus, and vagina). This screening involves a pelvic examination, including checking for microscopic changes to the surface of your cervix (Pap test). You may be encouraged to have this screening done every 3 years, beginning at age 20.  For women ages 3-65, health care providers may recommend pelvic exams and Pap testing every 3 years, or they may recommend the Pap and pelvic exam, combined with testing for human papilloma virus (HPV), every 5 years. Some types of HPV increase your risk of cervical cancer. Testing for HPV may also be done on women of any age with unclear Pap test results.  Other health care providers may not recommend any screening for nonpregnant women who are considered low risk for pelvic cancer and who do not have symptoms. Ask your health care provider if a screening pelvic exam is right for you.  If you have had past treatment for cervical cancer or a condition that could lead to cancer, you need Pap tests and screening for cancer for at least 20 years after your treatment. If Pap tests have been discontinued, your risk factors (such as having a new sexual partner) need to be reassessed to determine if screening should resume. Some women have medical problems that increase the chance of getting cervical cancer. In  these cases, your health care provider may recommend more frequent screening and Pap tests. Colorectal Cancer  This type of cancer can be detected and often prevented.  Routine colorectal cancer screening usually begins at 62 years of age and continues through 62 years of age.  Your health care provider may recommend screening at an earlier age if you have risk factors for colon cancer.  Your health care provider may also recommend using home test kits to check for hidden blood in the stool.  A small camera at the end of a tube can be used to examine your colon directly (sigmoidoscopy or colonoscopy). This is done to check for the earliest forms of colorectal cancer.  Routine screening usually begins at age 21.  Direct examination of the colon should be repeated every 5-10 years through 62 years of age. However, you may need to be screened more often if early forms of precancerous polyps or small growths are found. Skin Cancer  Check your skin from head to toe regularly.  Tell your health care provider about any  new moles or changes in moles, especially if there is a change in a mole's shape or color.  Also tell your health care provider if you have a mole that is larger than the size of a pencil eraser.  Always use sunscreen. Apply sunscreen liberally and repeatedly throughout the day.  Protect yourself by wearing long sleeves, pants, a wide-brimmed hat, and sunglasses whenever you are outside. HEART DISEASE, DIABETES, AND HIGH BLOOD PRESSURE   High blood pressure causes heart disease and increases the risk of stroke. High blood pressure is more likely to develop in:  People who have blood pressure in the high end of the normal range (130-139/85-89 mm Hg).  People who are overweight or obese.  People who are African American.  If you are 18-39 years of age, have your blood pressure checked every 3-5 years. If you are 40 years of age or older, have your blood pressure checked  every year. You should have your blood pressure measured twice--once when you are at a hospital or clinic, and once when you are not at a hospital or clinic. Record the average of the two measurements. To check your blood pressure when you are not at a hospital or clinic, you can use:  An automated blood pressure machine at a pharmacy.  A home blood pressure monitor.  If you are between 55 years and 79 years old, ask your health care provider if you should take aspirin to prevent strokes.  Have regular diabetes screenings. This involves taking a blood sample to check your fasting blood sugar level.  If you are at a normal weight and have a low risk for diabetes, have this test once every three years after 62 years of age.  If you are overweight and have a high risk for diabetes, consider being tested at a younger age or more often. PREVENTING INFECTION  Hepatitis B  If you have a higher risk for hepatitis B, you should be screened for this virus. You are considered at high risk for hepatitis B if:  You were born in a country where hepatitis B is common. Ask your health care provider which countries are considered high risk.  Your parents were born in a high-risk country, and you have not been immunized against hepatitis B (hepatitis B vaccine).  You have HIV or AIDS.  You use needles to inject street drugs.  You live with someone who has hepatitis B.  You have had sex with someone who has hepatitis B.  You get hemodialysis treatment.  You take certain medicines for conditions, including cancer, organ transplantation, and autoimmune conditions. Hepatitis C  Blood testing is recommended for:  Everyone born from 1945 through 1965.  Anyone with known risk factors for hepatitis C. Sexually transmitted infections (STIs)  You should be screened for sexually transmitted infections (STIs) including gonorrhea and chlamydia if:  You are sexually active and are younger than 62 years  of age.  You are older than 62 years of age and your health care provider tells you that you are at risk for this type of infection.  Your sexual activity has changed since you were last screened and you are at an increased risk for chlamydia or gonorrhea. Ask your health care provider if you are at risk.  If you do not have HIV, but are at risk, it may be recommended that you take a prescription medicine daily to prevent HIV infection. This is called pre-exposure prophylaxis (PrEP). You are considered at risk   if:  You are sexually active and do not regularly use condoms or know the HIV status of your partner(s).  You take drugs by injection.  You are sexually active with a partner who has HIV. Talk with your health care provider about whether you are at high risk of being infected with HIV. If you choose to begin PrEP, you should first be tested for HIV. You should then be tested every 3 months for as long as you are taking PrEP.  PREGNANCY   If you are premenopausal and you may become pregnant, ask your health care provider about preconception counseling.  If you may become pregnant, take 400 to 800 micrograms (mcg) of folic acid every day.  If you want to prevent pregnancy, talk to your health care provider about birth control (contraception). OSTEOPOROSIS AND MENOPAUSE   Osteoporosis is a disease in which the bones lose minerals and strength with aging. This can result in serious bone fractures. Your risk for osteoporosis can be identified using a bone density scan.  If you are 65 years of age or older, or if you are at risk for osteoporosis and fractures, ask your health care provider if you should be screened.  Ask your health care provider whether you should take a calcium or vitamin D supplement to lower your risk for osteoporosis.  Menopause may have certain physical symptoms and risks.  Hormone replacement therapy may reduce some of these symptoms and risks. Talk to your  health care provider about whether hormone replacement therapy is right for you.  HOME CARE INSTRUCTIONS   Schedule regular health, dental, and eye exams.  Stay current with your immunizations.   Do not use any tobacco products including cigarettes, chewing tobacco, or electronic cigarettes.  If you are pregnant, do not drink alcohol.  If you are breastfeeding, limit how much and how often you drink alcohol.  Limit alcohol intake to no more than 1 drink per day for nonpregnant women. One drink equals 12 ounces of beer, 5 ounces of wine, or 1 ounces of hard liquor.  Do not use street drugs.  Do not share needles.  Ask your health care provider for help if you need support or information about quitting drugs.  Tell your health care provider if you often feel depressed.  Tell your health care provider if you have ever been abused or do not feel safe at home.   This information is not intended to replace advice given to you by your health care provider. Make sure you discuss any questions you have with your health care provider.   Document Released: 05/22/2011 Document Revised: 11/27/2014 Document Reviewed: 10/08/2013 Elsevier Interactive Patient Education 2016 Elsevier Inc.  

## 2016-08-08 NOTE — Progress Notes (Signed)
Pre visit review using our clinic review tool, if applicable. No additional management support is needed unless otherwise documented below in the visit note. 

## 2016-08-08 NOTE — Assessment & Plan Note (Addendum)
Takes as needed Continue only as needed

## 2016-08-08 NOTE — Assessment & Plan Note (Signed)
BP well controlled Current regimen effective and well tolerated Continue current medications at current doses  

## 2016-08-08 NOTE — Assessment & Plan Note (Addendum)
Deferred neurology referral at this time She will monitor and let me know if she wants to be referred

## 2016-08-08 NOTE — Assessment & Plan Note (Signed)
Was on lipitor in the past - d/c'd after weight loss She has regained weight and wonders if she needs to go back on Check lipid panel Working on weight loss

## 2016-08-08 NOTE — Progress Notes (Signed)
Subjective:    Patient ID: Ariel Simpson, female    DOB: 17-Jul-1956, 60 y.o.   MRN: [id number]  HPI She is here to establish with a new pcp.   She is here for a physical exam.   H/o MVP and she would have palpitations.  She was on a BB for years.  Her mitral valve "popped" and she suddenly had SOB.  She ended up in the ED with CHF.  Her mitral valve was repaired.  She did have Afib prior to surgery and did have the MAZE procedure.   She has not had any recurrences.  She is following with cardiology. She denies chest pain, palpitations and shortness of breath.  Tremor on her right hand.  She can barely write.  It is worse after just working out. No resting tremor.  It occurs with activity - more with slight activity more than larger activities.  It feels like it occurs on her entire right side a time.  This is very concerning to her, but she is not sure if he wants to see neurology at this time.  Hypertension: She is taking her medication daily. She is compliant with a low sodium diet.  She is exercising regularly.      Hyperlipidemia:  She was on lipitor prior to her surgery.  Her weight decreased and her cholesterol went down.  She went off the medication.  She wonders if she needs to go back on.   Medications and allergies reviewed with patient and updated if appropriate.  Patient Active Problem List   Diagnosis Date Noted  . Essential hypertension 02/24/2014  . Diplopia 06/16/2013  . S/P mitral valve repair 03/18/2013  . S/P Maze operation for atrial fibrillation 03/18/2013  . Paroxysmal atrial fibrillation ([place]) 03/11/2013  . History of PSVT (paroxysmal supraventricular tachycardia) 03/03/2013    Class: Chronic    Current Outpatient Prescriptions on File Prior to Visit  Medication Sig Dispense Refill  . aspirin 81 MG tablet Take 1 tablet (81 mg total) by mouth daily.    . Calcium Carbonate-Vitamin D (CALCIUM + D PO) Take 2 capsules by mouth daily.    Marland Kitchen GLUCOSA-CHONDR-NA  CHONDR-MSM PO Take 2 capsules by mouth daily.    . metoprolol succinate (TOPROL-XL) 50 MG 24 hr tablet Take 1 tablet (50 mg total) by mouth daily. 90 tablet 3  . Omega-3 Fatty Acids (FISH OIL PO) Take 2 capsules by mouth daily.    . polyethylene glycol (MIRALAX / GLYCOLAX) packet Take 17 g by mouth daily.    Marland Kitchen spironolactone-hydrochlorothiazide (ALDACTAZIDE) 25-25 MG tablet TAKE ONE-HALF (1/2) TABLET DAILY 45 tablet 2  . zolpidem (AMBIEN) 5 MG tablet Take 2.5 mg by mouth at bedtime as needed for sleep.      No current facility-administered medications on file prior to visit.     Past Medical History:  Diagnosis Date  . CHF (congestive heart failure) ([place])   . Heart murmur   . High cholesterol   . Hypertension   . Mitral regurgitation   . Mitral valve prolapse   . Paroxysmal atrial fibrillation ([place]) 03/11/2013  . Paroxysmal supraventricular tachycardia ([place])   . PONV (postoperative nausea and vomiting)    Dizziness and Nausea  . S/P Maze operation for atrial fibrillation 03/18/2013   Complete bilateral atrial lesion set using cryothermy and biopolar radiofrequency ablation with oversewing of LA appendage via right mini thoracotomy  . S/P mitral valve repair 03/18/2013   Complex valvuloplasty including triangular resection  of posterior leaflet, artificial Gore-tex neocord placement x4 and 72mm Sorin Memo 3D ring annuloplasty via right mini thoracotomy  . Severe mitral regurgitation 03/03/2013   Acute on chronic with acute heart failure   . Shortness of breath     Past Surgical History:  Procedure Laterality Date  . ABDOMINAL HYSTERECTOMY     hernia repair  . CARDIAC CATHETERIZATION    . INTRAOPERATIVE TRANSESOPHAGEAL ECHOCARDIOGRAM N/A 03/18/2013   Procedure: INTRAOPERATIVE TRANSESOPHAGEAL ECHOCARDIOGRAM;  Surgeon: Rexene Alberts, MD;  Location: [facility];  Service: Open Heart Surgery;  Laterality: N/A;  . LEFT AND RIGHT HEART CATHETERIZATION WITH CORONARY ANGIOGRAM N/A 03/06/2013    Procedure: LEFT AND RIGHT HEART CATHETERIZATION WITH CORONARY ANGIOGRAM;  Surgeon: Sinclair Grooms, MD;  Location[facility] C[facility] LAB;  Service: Cardiovascular;  Laterality: N/A;  . MAZE N/A 03/18/2013   Procedure: MAZE;  Surgeon: Rexene Alberts, MD;  Location: [facility];  Service: Open Heart Surgery;  Laterality: N/A;  . MITRAL VALVE REPAIR Right 03/18/2013   Procedure: MINIMALLY INVASIVE MITRAL VALVE REPAIR (MVR);  Surgeon: Rexene Alberts, MD;  Location: [facility];  Service: Open Heart Surgery;  Laterality: Right;  . vericose veins    . WISDOM TOOTH EXTRACTION      Social History   Social History  . Marital status: Married    Spouse name: Fritz Pickerel  . Number of children: 3  . Years of education: BS   Occupational History  .  [employer]   Social History Main Topics  . Smoking status: Never Smoker  . Smokeless tobacco: Never Used  . Alcohol use 1.8 oz/week    3 Glasses of wine per week     Comment: occasional  . Drug use: No  . Sexual activity: Yes   Other Topics Concern  . None   Social History Narrative   Patient lives at home with her husband  Fritz Pickerel). Patient has college education B.S. 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   Caffeine- one cup daily.    Family History  Problem Relation Age of Onset  . Cancer Mother   . Cancer Father     Review of Systems  Constitutional: Negative for chills and fever.  HENT: Negative for hearing loss.   Eyes: Negative for visual disturbance.  Respiratory: Negative for cough, shortness of breath and wheezing.   Cardiovascular: Positive for palpitations. Negative for chest pain and leg swelling.  Gastrointestinal: Positive for constipation (miralax daily). Negative for abdominal pain, blood in stool, diarrhea and nausea.       No gerd  Genitourinary: Negative for dysuria and hematuria.  Musculoskeletal: Positive for arthralgias (mild).       Tendinitis right lower leg  Skin: Negative for color change and  rash.  Neurological: Positive for tremors (right hand ). Negative for light-headedness and headaches.  Psychiatric/Behavioral: Negative for dysphoric mood. The patient is not nervous/anxious.        Objective:   Vitals:   08/08/16 0904  BP: 118/80  Pulse: (!) 55  Resp: 16  Temp: 98 F (36.7 C)   Filed Weights   08/08/16 0904  Weight: 168 lb (76.2 kg)   Body mass index is 26.31 kg/m.   Physical Exam Constitutional: She appears well-developed and well-nourished. No distress.  HENT:  Head: Normocephalic and atraumatic.  Right Ear: External ear normal. Normal ear canal and TM Left Ear: External ear normal.  Normal ear canal and TM Mouth/Throat:  Oropharynx is clear and moist.  Eyes: Conjunctivae and EOM are normal.  Neck: Neck supple. No tracheal deviation present. No thyromegaly present.  No carotid bruit  Cardiovascular: Normal rate, regular rhythm and normal heart sounds.   No murmur heard.  No edema. Pulmonary/Chest: Effort normal and breath sounds normal. No respiratory distress. She has no wheezes. She has no rales.  Breast: deferred to Gyn Abdominal: Soft. She exhibits no distension. There is no tenderness.  Lymphadenopathy: She has no cervical adenopathy.  Skin: Skin is warm and dry. She is not diaphoretic.  Psychiatric: She has a normal mood and affect. Her behavior is normal.       Assessment & Plan:   Physical exam: Screening blood work  ordered Immunizations:    Flu vaccine at work Colonoscopy  Up to date  -- last at age 89 Mammogram  Up to date  64  - no longer sees gyn Dexa   Up to date  Eye exams  Due - will schedule Exercise - exercising regularly - yoga Weight  Overweight - working on weight loss Skin  No concerns Substance abuse  none  See Problem List for Assessment and Plan of chronic medical problems.   Follow up in one year

## 2016-08-10 ENCOUNTER — Encounter: Payer: Self-pay | Admitting: Internal Medicine

## 2016-08-10 LAB — CARBAMAZEPINE, FREE & TOTAL
Carbamazepine, Free: 20 % (ref 8.0–35.0)
Carbamazepine,Free: 1.9 ug/mL (ref 1.0–3.0)
Carbamazepine,Total: 9.5 ug/mL (ref 4.0–12.0)

## 2016-08-14 ENCOUNTER — Encounter: Payer: Self-pay | Admitting: Primary Care

## 2016-08-14 NOTE — Progress Notes (Signed)
Reason for Visit: Sinusitis    Patient had called over the weekend concerned about shaking sensation in her whole body.  She denied any Reiger type movements.  This was not at all similar to her previous history of seizures.  She never lost consciousness.  No incontinence.  No confusion.  Just felt somewhat shaky all over.  No gait unsteadiness.  No headache.  I had spoken with her on Saturday morning and we did lab work which was unremarkable.  On Sunday she began to note significant discolored nasal drainage.  Patient does have a history of recurrent sinusitis and symptoms seemed consistent with her previous history.    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    Ringing In The Ears (Tinnitus) H93.19    GERD (gastroesophageal reflux disease) K21.9    Pain in joint, ankle and foot M25.579    Weakness of foot M21.40    Hyperlipidemia E78.5     Current Outpatient Prescriptions   Medication    doxycycline (VIBRA-TABS) 100 MG tablet    azelastine (ASTELIN) 0.1 % nasal spray    fluticasone-salmeterol (ADVAIR DISKUS) 250-50 MCG/DOSE diskus inhaler    carBAMazepine (TEGRETOL XR) 200 MG 12 hr tablet    ranitidine (ZANTAC) 150 MG tablet    carBAMazepine (CARBATROL) 100 MG 12 hr capsule    atorvastatin (LIPITOR) 10 MG tablet    RABEprazole (ACIPHEX) 20 MG tablet    levothyroxine (SYNTHROID, LEVOTHROID) 25 MCG tablet    darifenacin (ENABLEX) 15 MG 24 hr tablet    amLODIPine (NORVASC) 2.5 MG tablet    fluticasone (FLONASE) 50 MCG/ACT nasal spray    Calcium Carb-Cholecalciferol 600-200 MG-UNIT TABS     GuaiFENesin (MUCINEX PO)    Denosumab (PROLIA SC)    loratadine (CLARITIN) 10 MG tablet    Multiple Vitamin (MULTIVITAMIN) per tablet    cholecalciferol (VITAMIN D) 1000 UNITS capsule     No current facility-administered medications for this visit.        Medication list reviewed and updated, changes were made today    Exam: BP 130/86   Pulse 66   Ht 1.598 m (5' 2.91")   Wt 51.3 kg (113 lb)   BMI 20.07 kg/m2  Well-nourished alert and conversant, no acute distress.  Neck is supple without lad, lungs are CTA bilaterally, resp easy.  Cardiac exam reveals nl S1S2 RRR.  Abdomen is soft, nontender, normoactive BS appreciated.  No organomegaly noted.  Extremities reveal 2+DP and PT pulses and no edema.  No tremor    EKG reveals normal sinus rhythm with a rate of 75 bpm.  Axis is 35.  No Q waves are noted.  Less than 1 mm of ST depression in inferior leads is unchanged from previous EKG.  Initially, I felt that there was a later R-wave progression on her EKG than on the previous.  However, when I look back to EKGs further back in the past, the EKG today is exactly the same.  A/P:  Shaking noted over  weekend-we checked labs to be sure that there were no signs of electrolyte abnormalities, elevated white count.  At time of visit, still waiting on carbamazepine level.  However in the meantime, patient has developed signs of a sinus infection and exam is consistent with recurrent sinusitis.  Will treat with doxycycline for 10 days and if symptoms do not resolve, will consider further workup and potentially the need for consultation with neurology    Blood pressure is within normal limits, TSH is normal on current dose of levothyroxine.  No other changes were made today

## 2016-08-15 ENCOUNTER — Encounter: Payer: Self-pay | Admitting: Internal Medicine

## 2016-08-16 ENCOUNTER — Telehealth: Payer: Self-pay | Admitting: Neurology

## 2016-08-16 MED ORDER — ATORVASTATIN CALCIUM 20 MG PO TABS: 20.0000 mg | ORAL_TABLET | Freq: Every day | ORAL | 3 refills | Status: DC

## 2016-08-16 NOTE — Telephone Encounter (Signed)
Patient is calling to schedule a regular 6 month FUV appointment with Dr.Benesch. Patient states that she is not having any worsening symptoms she was just calling to schedule a FUV appointment, Dr.Benesch doesn't have anything available as of now. Please advise.

## 2016-08-21 ENCOUNTER — Ambulatory Visit: Payer: Self-pay | Admitting: Neurology

## 2016-08-21 ENCOUNTER — Encounter: Payer: Self-pay | Admitting: Neurology

## 2016-08-21 VITALS — BP 100/70 | HR 66 | Ht 62.91 in | Wt 113.0 lb

## 2016-08-21 DIAGNOSIS — Z23 Encounter for immunization: Secondary | ICD-10-CM

## 2016-08-21 DIAGNOSIS — R21 Rash and other nonspecific skin eruption: Secondary | ICD-10-CM

## 2016-08-21 MED ORDER — TRIAMCINOLONE ACETONIDE 0.025 % EX CREA *I*
TOPICAL_CREAM | Freq: Two times a day (BID) | CUTANEOUS | 1 refills | Status: DC
Start: 2016-08-21 — End: 2016-11-08

## 2016-08-21 NOTE — Telephone Encounter (Signed)
Patient called to follow-up on message below. Please advise when patient may be added on. She does not want to go too far over her six month follow-up time frame (last seen in March)

## 2016-08-22 NOTE — Telephone Encounter (Signed)
Left message asking patient to return our call. Please transfer to writer she calls back

## 2016-08-22 NOTE — Telephone Encounter (Signed)
Patient called back and has been scheduled for 10/23 9AM with Dr. Johna Sheriff

## 2016-08-26 NOTE — Progress Notes (Addendum)
SUBJECTIVE:  Noted several red papules on right forearm over the weekend.  Pruritic.  Denies known contact irritant or allergy.  Denies change in skin care products.  Denies change in medication regime.  Denies fever, chills, night sweats, body aches.  Has not applied topical agents.    Medication regime reconciled.  Current Outpatient Prescriptions   Medication    doxycycline (VIBRA-TABS) 100 MG tablet    azelastine (ASTELIN) 0.1 % nasal spray    fluticasone-salmeterol (ADVAIR DISKUS) 250-50 MCG/DOSE diskus inhaler    carBAMazepine (TEGRETOL XR) 200 MG 12 hr tablet    carBAMazepine (CARBATROL) 100 MG 12 hr capsule    atorvastatin (LIPITOR) 10 MG tablet    RABEprazole (ACIPHEX) 20 MG tablet    levothyroxine (SYNTHROID, LEVOTHROID) 25 MCG tablet    darifenacin (ENABLEX) 15 MG 24 hr tablet    amLODIPine (NORVASC) 2.5 MG tablet    fluticasone (FLONASE) 50 MCG/ACT nasal spray    Calcium Carb-Cholecalciferol 600-200 MG-UNIT TABS    GuaiFENesin (MUCINEX PO)    Denosumab (PROLIA SC)    loratadine (CLARITIN) 10 MG tablet    Multiple Vitamin (MULTIVITAMIN) per tablet    cholecalciferol (VITAMIN D) 1000 UNITS capsule    ranitidine (ZANTAC) 150 MG tablet    triamcinolone (KENALOG) 0.025 % cream     No current facility-administered medications for this visit.      OBJECTIVE:  Blood pressure 100/70, pulse 66, height 1.598 m (5' 2.91"), weight 51.3 kg (113 lb).  Dr Edwina Barth  appears comfortable.  Several erythematous papules noted on right forearm.  No induration or fluctuance.  No linear distribution.      IMPRESSION/PLAN:  Papular rash.  Most consistent with contact irritant.  Triamcinolone cream twice daily.  Cetaphil or Dial antibacterial wash.  Dermatology consult if not improving.

## 2016-08-27 ENCOUNTER — Other Ambulatory Visit: Payer: Self-pay | Admitting: Primary Care

## 2016-09-05 ENCOUNTER — Ambulatory Visit: Payer: Self-pay

## 2016-09-05 DIAGNOSIS — Z23 Encounter for immunization: Secondary | ICD-10-CM

## 2016-09-05 NOTE — Progress Notes (Signed)
Pt tolerated 0.71ml dose of quadrivalent influenza vaccine as routine health maintenance today as pt received 0.16ml dose on October 12,2017. Dose split per pt request

## 2016-09-11 ENCOUNTER — Encounter: Payer: Self-pay | Admitting: Neurology

## 2016-09-11 ENCOUNTER — Ambulatory Visit: Payer: Self-pay | Admitting: Neurology

## 2016-09-11 VITALS — BP 139/79 | HR 69 | Ht 62.0 in | Wt 116.0 lb

## 2016-09-11 DIAGNOSIS — R569 Unspecified convulsions: Secondary | ICD-10-CM

## 2016-09-12 NOTE — Progress Notes (Signed)
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     Overall she's been doing very well.  She has had no recurrent seizures.  She has been tolerating the Tegretol well. We discussed the potential switch at some point to Lamictal due to long-standing issues with decreased concentration and slowed thinking.    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  She continues to receive physical therapy through a trainer at home.    Her review of systems is otherwise notable only for chronic hearing loss and tinnitus, occasional heartburn, occasional urinary leakage.  She has had no change in her long-standing tremor.    Her son has now started at [facility].    Exam:  Vitals:    09/11/16 1005   BP: 139/79   Pulse: 69   Weight: 52.6 kg (116 lb)   Height: 1.575 m (5\' 2" )         She is alert and oriented.  Her speech is clear.  Comprehension is intact.  Visual fields are full to confrontation.  She continues to have mild end gaze nystagmus bilaterally.  Facial strength is symmetric.  She has full strength throughout.  There is no significant ataxia.  She has a bilateral sustention tremor.      Impression:    Stable seizure disorder with no recent changes in her Tegretol.  She appears to be doing a bit better than compared to her last visit.  I plan seeing her again in 6 months.

## 2016-09-23 ENCOUNTER — Encounter: Payer: Self-pay | Admitting: Internal Medicine

## 2016-09-23 DIAGNOSIS — M8580 Other specified disorders of bone density and structure, unspecified site: Secondary | ICD-10-CM | POA: Insufficient documentation

## 2016-09-23 DIAGNOSIS — Z792 Long term (current) use of antibiotics: Secondary | ICD-10-CM | POA: Insufficient documentation

## 2016-10-26 ENCOUNTER — Telehealth: Payer: Self-pay | Admitting: Primary Care

## 2016-10-26 DIAGNOSIS — E559 Vitamin D deficiency, unspecified: Secondary | ICD-10-CM

## 2016-10-26 DIAGNOSIS — E7800 Pure hypercholesterolemia, unspecified: Secondary | ICD-10-CM

## 2016-10-26 DIAGNOSIS — E039 Hypothyroidism, unspecified: Secondary | ICD-10-CM

## 2016-10-26 NOTE — Telephone Encounter (Signed)
Wonders if she needs labs before next visit

## 2016-10-26 NOTE — Telephone Encounter (Signed)
Yes, fasting labs are in

## 2016-10-26 NOTE — Telephone Encounter (Signed)
Notified Pt via V/M

## 2016-11-06 ENCOUNTER — Other Ambulatory Visit
Admission: RE | Admit: 2016-11-06 | Discharge: 2016-11-06 | Disposition: A | Discharge status: Home / Self Care | Payer: Self-pay | Source: Ambulatory Visit | Attending: Primary Care | Admitting: Primary Care

## 2016-11-06 DIAGNOSIS — E063 Autoimmune thyroiditis: Secondary | ICD-10-CM

## 2016-11-06 DIAGNOSIS — E038 Other specified hypothyroidism: Secondary | ICD-10-CM

## 2016-11-06 DIAGNOSIS — Z5181 Encounter for therapeutic drug level monitoring: Secondary | ICD-10-CM

## 2016-11-06 DIAGNOSIS — E039 Hypothyroidism, unspecified: Secondary | ICD-10-CM

## 2016-11-06 DIAGNOSIS — E7800 Pure hypercholesterolemia, unspecified: Secondary | ICD-10-CM

## 2016-11-06 DIAGNOSIS — E559 Vitamin D deficiency, unspecified: Secondary | ICD-10-CM

## 2016-11-06 LAB — LIPID PANEL
Chol/HDL Ratio: 1.9
Cholesterol: 222 mg/dL — AB
HDL: 117 mg/dL
LDL Calculated: 87 mg/dL
Non HDL Cholesterol: 105 mg/dL
Triglycerides: 89 mg/dL

## 2016-11-06 LAB — COMPREHENSIVE METABOLIC PANEL
ALT: 27 U/L (ref 0–35)
AST: 23 U/L (ref 0–35)
Albumin: 4.5 g/dL (ref 3.5–5.2)
Alk Phos: 61 U/L (ref 35–105)
Anion Gap: 11 (ref 7–16)
Bilirubin,Total: 0.2 mg/dL (ref 0.0–1.2)
CO2: 29 mmol/L — ABNORMAL HIGH (ref 20–28)
Calcium: 9.6 mg/dL (ref 8.6–10.2)
Chloride: 98 mmol/L (ref 96–108)
Creatinine: 0.73 mg/dL (ref 0.51–0.95)
GFR,Black: 101 *
GFR,Caucasian: 88 *
Glucose: 98 mg/dL (ref 60–99)
Lab: 18 mg/dL (ref 6–20)
Potassium: 4.4 mmol/L (ref 3.3–5.1)
Sodium: 138 mmol/L (ref 133–145)
Total Protein: 7.1 g/dL (ref 6.3–7.7)

## 2016-11-06 LAB — VITAMIN B12: Vitamin B12: 965 pg/mL (ref 232–1245)

## 2016-11-06 LAB — MAGNESIUM: Magnesium: 1.9 mEq/L (ref 1.3–2.1)

## 2016-11-06 LAB — TSH: TSH: 2.57 u[IU]/mL (ref 0.27–4.20)

## 2016-11-06 LAB — CBC
Hematocrit: 41 % (ref 34–45)
Hemoglobin: 13.2 g/dL (ref 11.2–15.7)
MCH: 33 pg/cell — ABNORMAL HIGH (ref 26–32)
MCHC: 32 g/dL (ref 32–36)
MCV: 100 fL — ABNORMAL HIGH (ref 79–95)
Platelets: 229 10*3/uL (ref 160–370)
RBC: 4.1 MIL/uL (ref 3.9–5.2)
RDW: 13.6 % (ref 11.7–14.4)
WBC: 5.8 10*3/uL (ref 4.0–10.0)

## 2016-11-06 LAB — IRON: Iron: 107 ug/dL (ref 34–165)

## 2016-11-08 ENCOUNTER — Ambulatory Visit: Payer: Self-pay | Admitting: Primary Care

## 2016-11-08 ENCOUNTER — Encounter: Payer: Self-pay | Admitting: Primary Care

## 2016-11-08 VITALS — BP 130/78 | HR 68 | Ht 62.01 in | Wt 115.6 lb

## 2016-11-08 DIAGNOSIS — Z0000 Encounter for general adult medical examination without abnormal findings: Secondary | ICD-10-CM

## 2016-11-08 DIAGNOSIS — H903 Sensorineural hearing loss, bilateral: Secondary | ICD-10-CM

## 2016-11-08 DIAGNOSIS — J309 Allergic rhinitis, unspecified: Secondary | ICD-10-CM

## 2016-11-08 DIAGNOSIS — K589 Irritable bowel syndrome without diarrhea: Secondary | ICD-10-CM

## 2016-11-08 DIAGNOSIS — K219 Gastro-esophageal reflux disease without esophagitis: Secondary | ICD-10-CM

## 2016-11-08 DIAGNOSIS — I10 Essential (primary) hypertension: Secondary | ICD-10-CM

## 2016-11-08 DIAGNOSIS — E034 Atrophy of thyroid (acquired): Secondary | ICD-10-CM

## 2016-11-08 DIAGNOSIS — M810 Age-related osteoporosis without current pathological fracture: Secondary | ICD-10-CM

## 2016-11-08 DIAGNOSIS — E7800 Pure hypercholesterolemia, unspecified: Secondary | ICD-10-CM

## 2016-11-08 DIAGNOSIS — G40909 Epilepsy, unspecified, not intractable, without status epilepticus: Secondary | ICD-10-CM

## 2016-11-08 LAB — VITAMIN D
25-OH VIT D2: <4 ng/mL
25-OH VIT D3: 57 ng/mL
25-OH Vit Total: 57 ng/mL (ref 30–60)

## 2016-11-08 MED ORDER — TRIAMCINOLONE ACETONIDE 0.1 % EX OINT *I*
TOPICAL_OINTMENT | Freq: Two times a day (BID) | CUTANEOUS | 3 refills | Status: DC
Start: 2016-11-08 — End: 2017-11-02

## 2016-11-08 NOTE — H&P (Signed)
History and Physical    HISTORY:  Chief Complaint   Patient presents with    Annual Exam         History of Present Illness:    HPI  She is now getting over a strain of her neck. She is working with PT and massage and it is improving.  She had initially been going to physical therapy for left-sided neck pain and she does have significant trapezius spasm which persists.  However, this was then overshadowed by right-sided neck pain which she developed just this week.  She does not recall doing anything in particular but feels that she must jolted and irritated the strap muscles of her neck.  As above though it is slowly improving    Will be seeing Dr. Cathrine Muster about the osteoporosis  She is wondering what else she should do.  She has been on Prolene now for 7 injections.  We discussed the possibility of a bone holiday after 10 injections.  She continues on calcium, vitamin D and tries to get some weightbearing exercise with physical therapy and home exercises    Dr. Berkley Harvey , may want to see him because of pain with surgical  plates, sharp pains just occasionally in the left wrist.  She does not want a referral now but may want to go back to see him at some point for x-rays and to be sure the hardware is all fine    She has some improvement in strength  Continuing to work with the physical therapist particularly in strength in her feet    Seizure disorder-when she did her labs they did not do the carbamazepine level.  She would like to go back and have this done.  She denies any symptoms suggestive of elevated levels.  No confusion or drowsiness.  Denies any seizures.    We reviewed her labs.  She remains on atorvastatin.  Total cholesterol 222, triglycerides 89, HDL 117 and LDL 87.    Vitamin D is normal at 57.  Thyroid is normal at 2.57.  Problems:  Patient Active Problem List   Diagnosis Code    Reported Trauma Neck     Allergic Rhinitis J30.9    Irritable bowel syndrome K58.9    Essential hypertension I10     Dysplastic Nevus I78.1    Rosacea L71.9    Osteoporosis M81.0    Hypothyroidism E03.9    Scoliosis M41.20    Urge Incontinence Of Urine N39.41    A-V Malformation Repair Supratentorial Complex     Seizure disorder G40.909    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    Ringing In The Ears (Tinnitus) H93.19    GERD (gastroesophageal reflux disease) K21.9    Pain in joint, ankle and foot M25.579    Weakness of foot M21.40    Hyperlipidemia E78.5        Past Medical/Surgical History:   Past Medical History:   Diagnosis Date    Contusion of foot 06/02/2013    Hypertension     Hypothyroidism     Osteoporosis     Seizure     Stroke      Past Surgical History:   Procedure Laterality Date    CONVERTED PROCEDURE      Craniotomy A-V Malformation Repair Conversion Data     HX TONSILLECTOMY/ADENOIDECTOMY      Tonsillectomy Conversion Data     TONSILLECTOMY           Allergies:    Allergies  Allergen Reactions    Penicillins Hives    Sulfa Antibiotics Other (See Comments)     Serum sickness    Bactrim Other (See Comments)     Serum sickness    Phenytoin Sodium Extended Swelling and Other (See Comments)      Edema    Seasonal Allergies Other (See Comments)     Trees,pollen,hay fever,mold          Current medications:    Current Outpatient Prescriptions   Medication Sig    ranitidine (ZANTAC) 150 MG tablet TAKE 1 TABLET (150 MG TOTAL) BY MOUTH 2 TIMES DAILY    triamcinolone (KENALOG) 0.025 % cream Apply topically 2 times daily    doxycycline (VIBRA-TABS) 100 MG tablet Take 1 tablet (100 mg total) by mouth 2 times daily    azelastine (ASTELIN) 0.1 % nasal spray SPRAY 2 SPRAYS INTO EACH NOSTRIL TWO TIMES DAILY    fluticasone-salmeterol (ADVAIR DISKUS) 250-50 MCG/DOSE diskus inhaler Inhale 1 puff into the lungs 2 times daily    carBAMazepine (TEGRETOL XR) 200 MG 12 hr tablet Take 2 tablets (400 mg total) by mouth 2 times daily (with meals)   Swallow whole. Do not crush, break, or chew. Along with  100 mg tablet    carBAMazepine (CARBATROL) 100 MG 12 hr capsule Take 1 capsule (100 mg total) by mouth 2 times daily    atorvastatin (LIPITOR) 10 MG tablet Take 1 tablet (10 mg total) by mouth daily (with dinner)    RABEprazole (ACIPHEX) 20 MG tablet Take 1 tablet (20 mg total) by mouth daily    levothyroxine (SYNTHROID, LEVOTHROID) 25 MCG tablet Take 1 tablet (25 mcg total) by mouth daily (before breakfast)    darifenacin (ENABLEX) 15 MG 24 hr tablet Take 1 tablet (15 mg total) by mouth daily    amLODIPine (NORVASC) 2.5 MG tablet Take 1 tablet (2.5 mg total) by mouth daily    fluticasone (FLONASE) 50 MCG/ACT nasal spray 2 sprays by Nasal route daily    Calcium Carb-Cholecalciferol 600-200 MG-UNIT TABS Take 1 tablet by mouth 3 times daily    GuaiFENesin (MUCINEX PO) Take 1 tablet by mouth 3 times daily    Denosumab (PROLIA SC) Inject 1 Device into the skin every 6 months    loratadine (CLARITIN) 10 MG tablet Take 10 mg by mouth daily    Multiple Vitamin (MULTIVITAMIN) per tablet Take 1 tablet by mouth daily    cholecalciferol (VITAMIN D) 1000 UNITS capsule Take 1,000 Units by mouth 3 times daily       Family History:    Family History   Problem Relation Age of Onset    Aneurysm Other      died of a brain aneursym    Hypertension Mother     Arthritis Mother     Cancer Father     Prostate cancer Father     Other Sister      benign brain lesion     Allergy (severe) Sister     Arthritis Sister        Social/Occupational History:   Social History     Social History    Marital status: Married     Spouse name: N/A    Number of children: N/A    Years of education: N/A     Occupational History    psychologist      Social History Main Topics    Smoking status: Never Smoker    Smokeless tobacco: Never Used  Alcohol use No    Drug use: No    Sexual activity: Not Asked     Other Topics Concern    None     Social History Narrative    PT and trainer , each one hour weekly--right foot         Review  of Systems:    Review of Systems   Constitutional: Negative for chills, fever and weight loss.   HENT: Positive for congestion and nosebleeds. Negative for sinus pain and sore throat.         Nosebleeds  Chronic congestion  No sore throat or cough  Intermittent sinus headaches   Eyes: Negative for blurred vision, double vision and photophobia.        Needs to keep more light on at night to see, will go back to  Dr. Era Bumpers    Respiratory: Negative for cough, shortness of breath and wheezing.    Cardiovascular: Negative for chest pain, palpitations and leg swelling.   Gastrointestinal: Positive for heartburn and nausea. Negative for blood in stool, constipation, diarrhea and vomiting.        Neck pain can cause some nausea, no dizziness  Daily bran and miralax   Genitourinary: Positive for frequency and urgency. Negative for dysuria.   Musculoskeletal: Positive for neck pain.        Sometimes has som lower back pain  All joints can bother , feet are not doing well, working pedorthist about the plantar fasciitis  The left wrist still bothers   She has an intermittent sharp pain.      Skin: Positive for itching. Negative for rash.        Itchy dry skin  Full body skin check in January   Neurological:        From ortho standpoint, yes, but nothing new neurologically       Vital Signs:   BP 130/80   Pulse 68   Ht 1.575 m (5' 2.01")   Wt 52.4 kg (115 lb 9.6 oz)   BMI 21.14 kg/m2      PHYSICAL EXAM:  Physical Exam   Constitutional: She appears well-developed and well-nourished.   HENT:   Head: Normocephalic and atraumatic.   Right Ear: External ear normal.   Left Ear: External ear normal.   Mouth/Throat: Oropharynx is clear and moist.   Chronic boggy nasal mucosa   Eyes: Right eye exhibits no discharge. No scleral icterus.   Neck: No tracheal deviation present. No thyromegaly present.   Cardiovascular: Normal rate, regular rhythm and normal heart sounds.  Exam reveals no gallop and no friction rub.    No murmur  heard.  Pulmonary/Chest: Breath sounds normal. No respiratory distress. She has no wheezes. She has no rales.   Musculoskeletal: She exhibits no edema or tenderness.   Lymphadenopathy:     She has no cervical adenopathy.   Neurological: She displays normal reflexes. No cranial nerve deficit. She exhibits normal muscle tone. Coordination normal.   Patient has slight reduction in temperature sensation and vibration in the toes.  Monofilament testing is normal   Skin: No rash noted. No erythema. No pallor.   Psychiatric: She has a normal mood and affect. Her behavior is normal.    of note, patient's main neck pain right now is on the right.  She does have some muscle spasm and fullness of the strap muscles on the right and a significant muscular spasm of the left trapezius  Breast exam was deferred until  the next visit as patient was unable to lie down because of her neck pain.  In addition, we did not do a full abdominal exam for the same reason.  We are also deferring the EKG.      Assessment:    Zimora was seen today for annual exam.    Diagnoses and all orders for this visit:    Allergic rhinitis--reasonably well controlled on chronic antihistamines and nasal steroids    Essential hypertension-well controlled on just 1.25 mg daily of amlodipine    Gastroesophageal reflux disease, esophagitis presence not specified--patient is going to try going down on her proton pump inhibitor to 20 mg.  She will continue the ranitidine twice daily    Pure hypercholesterolemia well controlled on atorvastatin, no changes were made today    Hypothyroidism due to acquired atrophy of thyroid-continue levothyroxine    Irritable bowel syndrome, unspecified type-patient generally controls with conservative measures.  Overall symptoms have been less prominent    Osteoporosis, unspecified osteoporosis type, unspecified pathological fracture presence--pt will see DR. Cathrine Muster to get a second opinion on the treatment of her osteoporosis.   She is status post 7 doses of prolia.  Bone density has improved over this time    Seizure disorder will check levels and send results to her neurologist  -     Carbamazepine, free & total; Standing    Other orders patient uses a steroid ointment to help with dryness in the external ear canal.  This lately has not been overwhelmingly effective, will send a higher pulse steroid ointment  -     triamcinolone (KENALOG) 0.1 % ointment; Apply topically 2 times daily    Otherwise she is up-to-date on immunizations and health maintenance   .

## 2016-11-09 ENCOUNTER — Other Ambulatory Visit: Payer: Self-pay | Admitting: Primary Care

## 2016-11-09 DIAGNOSIS — E785 Hyperlipidemia, unspecified: Secondary | ICD-10-CM

## 2016-11-09 DIAGNOSIS — N3941 Urge incontinence: Secondary | ICD-10-CM

## 2016-11-10 MED ORDER — CARBAMAZEPINE 100 MG PO CP12 *I*
100.0000 mg | ORAL_CAPSULE | Freq: Two times a day (BID) | ORAL | 3 refills | Status: DC
Start: 2016-11-10 — End: 2017-11-02

## 2016-11-10 MED ORDER — AMLODIPINE BESYLATE 2.5 MG PO TABS *I*
2.5000 mg | ORAL_TABLET | Freq: Every day | ORAL | 3 refills | Status: DC
Start: 2016-11-10 — End: 2017-12-26

## 2016-11-10 MED ORDER — RANITIDINE HCL 150 MG PO TABS *I*
150.0000 mg | ORAL_TABLET | Freq: Two times a day (BID) | ORAL | 3 refills | Status: DC
Start: 2016-11-10 — End: 2018-09-06

## 2016-11-10 MED ORDER — CARBAMAZEPINE 200 MG PO TB12 *I*
400.0000 mg | ORAL_TABLET | Freq: Two times a day (BID) | ORAL | 3 refills | Status: DC
Start: 2016-11-10 — End: 2017-11-02

## 2016-11-10 MED ORDER — RABEPRAZOLE SODIUM 20 MG PO TBEC *I*
20.0000 mg | DELAYED_RELEASE_TABLET | Freq: Every day | ORAL | 3 refills | Status: DC
Start: 2016-11-10 — End: 2016-11-14

## 2016-11-10 MED ORDER — DARIFENACIN HYDROBROMIDE 15 MG PO TB24 *A*
15.0000 mg | ORAL_TABLET | Freq: Every day | ORAL | 3 refills | Status: DC
Start: 2016-11-10 — End: 2017-02-12

## 2016-11-10 MED ORDER — ATORVASTATIN CALCIUM 10 MG PO TABS *I*
10.0000 mg | ORAL_TABLET | Freq: Every day | ORAL | 3 refills | Status: DC
Start: 2016-11-10 — End: 2017-11-02

## 2016-11-10 MED ORDER — LEVOTHYROXINE SODIUM 25 MCG PO TABS *I*
25.0000 ug | ORAL_TABLET | Freq: Every day | ORAL | 3 refills | Status: DC
Start: 2016-11-10 — End: 2016-11-14

## 2016-11-11 ENCOUNTER — Other Ambulatory Visit: Payer: Self-pay | Admitting: Primary Care

## 2016-11-11 DIAGNOSIS — E785 Hyperlipidemia, unspecified: Secondary | ICD-10-CM

## 2016-11-14 ENCOUNTER — Other Ambulatory Visit: Payer: Self-pay | Admitting: Primary Care

## 2016-11-14 MED ORDER — LEVOTHYROXINE SODIUM 25 MCG PO TABS *I*
25.0000 ug | ORAL_TABLET | Freq: Every day | ORAL | 3 refills | Status: DC
Start: 2016-11-14 — End: 2017-11-02

## 2016-11-14 MED ORDER — RABEPRAZOLE SODIUM 20 MG PO TBEC *I*
20.0000 mg | DELAYED_RELEASE_TABLET | Freq: Every day | ORAL | 3 refills | Status: DC
Start: 2016-11-14 — End: 2017-11-02

## 2016-12-06 ENCOUNTER — Encounter: Payer: Self-pay | Admitting: Gastroenterology

## 2016-12-08 ENCOUNTER — Telehealth: Payer: Self-pay | Admitting: Neurology

## 2016-12-08 NOTE — Telephone Encounter (Signed)
Received note from [facility].  Sent to scanning

## 2016-12-11 ENCOUNTER — Other Ambulatory Visit
Admission: RE | Admit: 2016-12-11 | Discharge: 2016-12-11 | Disposition: A | Discharge status: Home / Self Care | Payer: Self-pay | Source: Ambulatory Visit

## 2016-12-11 DIAGNOSIS — G40909 Epilepsy, unspecified, not intractable, without status epilepticus: Secondary | ICD-10-CM

## 2016-12-25 ENCOUNTER — Other Ambulatory Visit
Admission: RE | Admit: 2016-12-25 | Discharge: 2016-12-25 | Disposition: A | Discharge status: Home / Self Care | Payer: Self-pay | Source: Ambulatory Visit | Attending: Rheumatology | Admitting: Rheumatology

## 2016-12-26 ENCOUNTER — Other Ambulatory Visit: Payer: Self-pay | Admitting: *Deleted

## 2016-12-26 LAB — TISSUE TRANSGLUT,IGA: tTG,IgA: <0.5 U/mL (ref 0.0–14.9)

## 2016-12-26 MED ORDER — METOPROLOL SUCCINATE ER 50 MG PO TB24: 50.0000 mg | ORAL_TABLET | Freq: Every day | ORAL | 0 refills | Status: DC

## 2016-12-27 LAB — GLIADIN ANTIBODIES, SERUM
Deamintd Gliadin IgA: 2 Units (ref 0–19)
Deamintd Gliadin IgG: 3 Units (ref 0–19)

## 2016-12-27 LAB — TISSUE TRANSGLUTAMINASE, IGG: tTG,IgG: 13 U/mL — ABNORMAL HIGH (ref 0–5)

## 2016-12-28 LAB — VITAMIN K: Vitamin K: 0.64 nmol/L (ref 0.22–4.88)

## 2016-12-29 LAB — VITAMIN C: Vitamin C: 49 umol/L (ref 23–114)

## 2016-12-30 LAB — IODINE: Iodine: 45.6 ug/L (ref 40.0–92.0)

## 2017-01-05 LAB — HM MAMMOGRAPHY

## 2017-01-09 ENCOUNTER — Other Ambulatory Visit: Payer: Self-pay | Admitting: Interventional Cardiology

## 2017-01-09 DIAGNOSIS — I10 Essential (primary) hypertension: Secondary | ICD-10-CM

## 2017-01-18 ENCOUNTER — Encounter: Payer: Self-pay | Admitting: Internal Medicine

## 2017-02-05 ENCOUNTER — Encounter: Payer: Self-pay | Admitting: Gastroenterology

## 2017-02-09 ENCOUNTER — Telehealth: Payer: Self-pay | Admitting: Primary Care

## 2017-02-09 ENCOUNTER — Ambulatory Visit: Payer: PRIVATE HEALTH INSURANCE | Admitting: Primary Care

## 2017-02-09 DIAGNOSIS — G40909 Epilepsy, unspecified, not intractable, without status epilepticus: Secondary | ICD-10-CM

## 2017-02-09 DIAGNOSIS — E039 Hypothyroidism, unspecified: Secondary | ICD-10-CM

## 2017-02-09 DIAGNOSIS — E559 Vitamin D deficiency, unspecified: Secondary | ICD-10-CM

## 2017-02-09 DIAGNOSIS — E785 Hyperlipidemia, unspecified: Secondary | ICD-10-CM

## 2017-02-09 NOTE — Telephone Encounter (Signed)
Labs are in, fsting

## 2017-02-09 NOTE — Telephone Encounter (Signed)
Pt scheduled for a follow up on May 1. She needs BW orders placed. Thanks

## 2017-02-09 NOTE — Telephone Encounter (Signed)
Pt.notified

## 2017-02-12 ENCOUNTER — Other Ambulatory Visit
Admission: RE | Admit: 2017-02-12 | Discharge: 2017-02-12 | Disposition: A | Discharge status: Home / Self Care | Payer: PRIVATE HEALTH INSURANCE | Source: Ambulatory Visit | Attending: Primary Care | Admitting: Primary Care

## 2017-02-12 ENCOUNTER — Other Ambulatory Visit: Payer: Self-pay | Admitting: Primary Care

## 2017-02-12 DIAGNOSIS — G40909 Epilepsy, unspecified, not intractable, without status epilepticus: Secondary | ICD-10-CM | POA: Insufficient documentation

## 2017-02-12 DIAGNOSIS — Z5181 Encounter for therapeutic drug level monitoring: Secondary | ICD-10-CM | POA: Insufficient documentation

## 2017-02-12 DIAGNOSIS — E063 Autoimmune thyroiditis: Secondary | ICD-10-CM

## 2017-02-12 DIAGNOSIS — N3941 Urge incontinence: Secondary | ICD-10-CM

## 2017-02-12 DIAGNOSIS — E785 Hyperlipidemia, unspecified: Secondary | ICD-10-CM | POA: Insufficient documentation

## 2017-02-12 DIAGNOSIS — E039 Hypothyroidism, unspecified: Secondary | ICD-10-CM | POA: Insufficient documentation

## 2017-02-12 DIAGNOSIS — E559 Vitamin D deficiency, unspecified: Secondary | ICD-10-CM | POA: Insufficient documentation

## 2017-02-12 DIAGNOSIS — E038 Other specified hypothyroidism: Secondary | ICD-10-CM | POA: Insufficient documentation

## 2017-02-12 DIAGNOSIS — E7800 Pure hypercholesterolemia, unspecified: Secondary | ICD-10-CM | POA: Insufficient documentation

## 2017-02-12 LAB — CBC
Hematocrit: 43 % (ref 34–45)
Hemoglobin: 14 g/dL (ref 11.2–15.7)
MCH: 32 pg/cell (ref 26–32)
MCHC: 32 g/dL (ref 32–36)
MCV: 99 fL — ABNORMAL HIGH (ref 79–95)
Platelets: 115 10*3/uL — ABNORMAL LOW (ref 160–370)
RBC: 4.4 MIL/uL (ref 3.9–5.2)
RDW: 12.9 % (ref 11.7–14.4)
WBC: 4.6 10*3/uL (ref 4.0–10.0)

## 2017-02-12 LAB — LIPID PANEL
Chol/HDL Ratio: 2.1
Cholesterol: 239 mg/dL — AB
HDL: 114 mg/dL
LDL Calculated: 105 mg/dL
Non HDL Cholesterol: 125 mg/dL
Triglycerides: 98 mg/dL

## 2017-02-12 LAB — CK: CK: 57 U/L (ref 34–145)

## 2017-02-12 LAB — MAGNESIUM: Magnesium: 1.7 mEq/L (ref 1.3–2.1)

## 2017-02-12 LAB — IRON: Iron: 123 ug/dL (ref 34–165)

## 2017-02-12 LAB — VITAMIN B12: Vitamin B12: 1054 pg/mL (ref 232–1245)

## 2017-02-12 LAB — TSH: TSH: 1.99 u[IU]/mL (ref 0.27–4.20)

## 2017-02-12 MED ORDER — DARIFENACIN HYDROBROMIDE 15 MG PO TB24 *A*
15.0000 mg | ORAL_TABLET | Freq: Every day | ORAL | 3 refills | Status: DC
Start: 2017-02-12 — End: 2017-09-01

## 2017-02-14 LAB — COMPREHENSIVE METABOLIC PANEL
ALT: 34 U/L (ref 0–35)
AST: 26 U/L (ref 0–35)
Albumin: 5 g/dL (ref 3.5–5.2)
Alk Phos: 67 U/L (ref 35–105)
Anion Gap: 18 — ABNORMAL HIGH (ref 7–16)
Bilirubin,Total: 0.2 mg/dL (ref 0.0–1.2)
CO2: 24 mmol/L (ref 20–28)
Calcium: 10 mg/dL (ref 8.6–10.2)
Chloride: 95 mmol/L — ABNORMAL LOW (ref 96–108)
Creatinine: 0.68 mg/dL (ref 0.51–0.95)
GFR,Black: 108 *
GFR,Caucasian: 93 *
Glucose: 93 mg/dL (ref 60–99)
Lab: 17 mg/dL (ref 6–20)
Potassium: 4.6 mmol/L (ref 3.3–5.1)
Sodium: 137 mmol/L (ref 133–145)
Total Protein: 7.7 g/dL (ref 6.3–7.7)

## 2017-02-15 LAB — CARBAMAZEPINE, FREE & TOTAL
Carbamazepine, Free: 22.6 % (ref 8.0–35.0)
Carbamazepine,Free: 2.8 ug/mL (ref 1.0–3.0)
Carbamazepine,Total: 12.4 ug/mL — ABNORMAL HIGH (ref 4.0–12.0)

## 2017-02-15 LAB — VITAMIN D
25-OH VIT D2: <4 ng/mL
25-OH VIT D3: 63 ng/mL
25-OH Vit Total: 63 ng/mL — ABNORMAL HIGH (ref 30–60)

## 2017-03-03 ENCOUNTER — Other Ambulatory Visit: Payer: Self-pay | Admitting: Interventional Cardiology

## 2017-03-09 ENCOUNTER — Other Ambulatory Visit: Payer: Self-pay | Admitting: Chiropractic Medicine

## 2017-03-09 ENCOUNTER — Ambulatory Visit
Admission: RE | Admit: 2017-03-09 | Discharge: 2017-03-09 | Disposition: A | Discharge status: Home / Self Care | Payer: PRIVATE HEALTH INSURANCE | Source: Ambulatory Visit

## 2017-03-09 DIAGNOSIS — M47816 Spondylosis without myelopathy or radiculopathy, lumbar region: Secondary | ICD-10-CM

## 2017-03-09 DIAGNOSIS — M1990 Unspecified osteoarthritis, unspecified site: Secondary | ICD-10-CM

## 2017-03-14 ENCOUNTER — Encounter: Payer: Self-pay | Admitting: Orthopedic Surgery

## 2017-03-14 ENCOUNTER — Ambulatory Visit: Payer: PRIVATE HEALTH INSURANCE | Admitting: Orthopedic Surgery

## 2017-03-14 ENCOUNTER — Ambulatory Visit
Admission: RE | Admit: 2017-03-14 | Discharge: 2017-03-14 | Disposition: A | Discharge status: Home / Self Care | Payer: PRIVATE HEALTH INSURANCE | Source: Ambulatory Visit | Attending: Orthopedic Surgery | Admitting: Orthopedic Surgery

## 2017-03-14 VITALS — BP 143/72 | HR 75 | Ht 63.0 in | Wt 114.0 lb

## 2017-03-14 DIAGNOSIS — S52572S Other intraarticular fracture of lower end of left radius, sequela: Secondary | ICD-10-CM

## 2017-03-14 DIAGNOSIS — M85832 Other specified disorders of bone density and structure, left forearm: Secondary | ICD-10-CM | POA: Insufficient documentation

## 2017-03-14 DIAGNOSIS — M25531 Pain in right wrist: Secondary | ICD-10-CM | POA: Insufficient documentation

## 2017-03-14 DIAGNOSIS — M8588 Other specified disorders of bone density and structure, other site: Secondary | ICD-10-CM | POA: Insufficient documentation

## 2017-03-14 NOTE — Progress Notes (Signed)
New Visit/former patient:  [name]     NEXT VISIT:  As needed    HISTORY: The patient is a 61 y.o. female  Left forearm pain   She will occasionally get a sharp pain that shoots down her forearm with certain activities   She is limited in her activities ever since her fracture and wonders if this is why   2011 she suffered a left distal radius fracture and distal ulna fracture which was treated operatively by [place]   In 2010 she suffered a right distal radius fracture - CRPP was performed    PAST MEDICAL, Social, Family HISTORY & Review of systems  Histories and ten systems are reviewed and negative except as documented in the record which I have reviewed and agree with and discussed the pertinent positives and negatives with the patient.    PHYSICAL EXAM  Vital Signs: BP 143/72   Pulse 75   Ht 1.6 m (5\' 3" )   Wt 51.7 kg (114 lb)   BMI 20.19 kg/m2   General Appearance:  Normal body habitus. Alert and oriented to person, place, and time, Affect:  Normal, Skin- Normal  Left upper extremity   Full digital range of motion   Oppose thumb to small finger   Thenar cone intact   Incision well healed   Wrist flexion to 80, extension to 50   Nontender over distal radius or distal ulna   Stable DRUJ   Supination to 60   pronation to 20   Full elbow flexion and extension   Nontender over radial head or olecranon       X-rays of both forearms taken today show no residual hardware in the right wrist.  Evidence of a prior distal radius fracture and ulnar styloid fracture.  Films of the left show ORIF of the distal radius and distal ulna.  Possible impingement of the ulnar plate on the DRUJ.  No evidence of joint space narrowing or arthritis.    IMPRESSION/PLAN   Status post left distal radius and distal ulna fractures with occasional pain   Since the pain is occasional and specific to certain movements, we do not feel that the risks of surgery outweigh the benefits of possible improved motion.   She is happy to live with  this and will continue to do so    Follow up as needed

## 2017-03-20 ENCOUNTER — Encounter: Payer: Self-pay | Admitting: Primary Care

## 2017-03-20 ENCOUNTER — Ambulatory Visit: Payer: PRIVATE HEALTH INSURANCE | Attending: Primary Care | Admitting: Primary Care

## 2017-03-20 ENCOUNTER — Ambulatory Visit: Payer: Self-pay | Admitting: Neurology

## 2017-03-20 ENCOUNTER — Ambulatory Visit: Payer: PRIVATE HEALTH INSURANCE | Admitting: Primary Care

## 2017-03-20 VITALS — BP 132/78 | HR 66 | Ht 63.0 in | Wt 117.0 lb

## 2017-03-20 DIAGNOSIS — E785 Hyperlipidemia, unspecified: Secondary | ICD-10-CM

## 2017-03-20 DIAGNOSIS — M25579 Pain in unspecified ankle and joints of unspecified foot: Secondary | ICD-10-CM

## 2017-03-20 DIAGNOSIS — G40909 Epilepsy, unspecified, not intractable, without status epilepticus: Secondary | ICD-10-CM

## 2017-03-20 DIAGNOSIS — I10 Essential (primary) hypertension: Secondary | ICD-10-CM

## 2017-03-20 DIAGNOSIS — M545 Low back pain, unspecified: Secondary | ICD-10-CM

## 2017-03-20 MED ORDER — NON-SYSTEM MEDICATION *A*
0 refills | Status: AC
Start: 2017-03-20 — End: ?

## 2017-03-20 NOTE — Progress Notes (Signed)
Reason for Visit: Follow-up; Hypertension; Hyperlipidemia; and Hyperthyroidism   osteoporosis-seeing Dr.  Cathrine Muster for help with this.  she is on more vitamin C, more coenzyme Q10  Otherwise, she remains on calcium and vitamin D.  She does her best to get weightbearing exercise.  She is also on Prolia     She strained her back her back and IT band when putting her shoes on at the last appt.  Now 85% back to normal.     Patient is due for breast exam and mammogram.  We had delayed her breast exam last time because her back was too tender to lie down on the examining table.  She has talked to her chiropractor and he feels that she is to do so today    The results of her labs.  Patient remains on atorvastatin.  Total cholesterol 239, triglycerides 98, HDL 114 and LDL 105.  Vitamin D level is slightly elevated at 60    Patient Active Problem List   Diagnosis Code    Reported Trauma Neck     Allergic rhinitis J30.9    Irritable bowel syndrome K58.9    Essential hypertension I10    Dysplastic Nevus I78.1    Rosacea L71.9    Osteoporosis M81.0    Hypothyroidism E03.9    Scoliosis M41.20    Urge Incontinence Of Urine N39.41    A-V Malformation Repair Supratentorial Complex     Seizure disorder G40.909    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    Ringing In The Ears (Tinnitus) H93.19    GERD (gastroesophageal reflux disease) K21.9    Pain in joint, ankle and foot M25.579    Weakness of foot M21.40    Hyperlipidemia E78.5     Current Outpatient Prescriptions   Medication    darifenacin (ENABLEX) 15 MG 24 hr tablet    RABEprazole (ACIPHEX) 20 MG tablet    levothyroxine (SYNTHROID, LEVOTHROID) 25 MCG tablet    ranitidine (ZANTAC) 150 MG tablet    amLODIPine (NORVASC) 2.5 MG tablet    atorvastatin (LIPITOR) 10 MG tablet    carBAMazepine (CARBATROL) 100 MG 12 hr capsule    carBAMazepine (TEGRETOL XR) 200 MG 12 hr tablet    triamcinolone (KENALOG) 0.1 % ointment    azelastine (ASTELIN) 0.1 %  nasal spray    fluticasone (FLONASE) 50 MCG/ACT nasal spray    Calcium Carb-Cholecalciferol 600-200 MG-UNIT TABS    Denosumab (PROLIA SC)    loratadine (CLARITIN) 10 MG tablet    Multiple Vitamin (MULTIVITAMIN) per tablet    cholecalciferol (VITAMIN D) 1000 UNITS capsule     No current facility-administered medications for this visit.        Medication list reviewed and updated, no changes were made today    Exam: BP 132/78   Pulse 66   Ht 1.6 m (5\' 3" )   Wt 53.1 kg (117 lb)   BMI 20.73 kg/m2  Breast exam was completed today as we were unable to do it at her physical.  Patient has fibrocystic breasts but no frank mass appreciated.  No axillary adenopathy appreciated.  Lungs are clear to auscultation bilaterally.  Cardiac exam reveals normal S1-S2 with a regular rate and rhythm.  Abdomen is soft and nontender and extremities are without edema.    EKG reveals normal sinus rhythm with a rate of 75 bpm.  Axis is 45.  Slightly delayed R-wave progression.  Unchanged from previous EKG.  No abnormal ST or T-wave changes.  No Q waves.    A/P:  1.  Strain of low back and IT band-patient continues with conservative measures to improve pain.  Continues to make progress slowly.    2.  Healthcare maintenance-breast exam was completed today.  Patient has mammogram scheduled.  3.  Hyperlipidemia-lipids have increased given reduced exercise through the winter.  LDL is 105, which remains very close to goal of 100 or below.  I did not make any changes to her low-dose atorvastatin today.  4.  Osteoporosis-patient continues on calcium, vitamin D and prolia every 6 months.  Vitamin D level is 63.  Given her severe osteoporosis, I did not make any recommendations for reducing her dose currently

## 2017-03-22 ENCOUNTER — Ambulatory Visit: Payer: Self-pay | Admitting: Neurology

## 2017-03-26 ENCOUNTER — Encounter: Payer: Self-pay | Admitting: Primary Care

## 2017-03-27 ENCOUNTER — Ambulatory Visit: Payer: Self-pay | Admitting: Neurology

## 2017-03-28 ENCOUNTER — Ambulatory Visit: Payer: PRIVATE HEALTH INSURANCE | Attending: Primary Care | Admitting: Neurology

## 2017-03-28 ENCOUNTER — Encounter: Payer: Self-pay | Admitting: Neurology

## 2017-03-28 VITALS — BP 131/69 | HR 72 | Ht 63.0 in | Wt 118.0 lb

## 2017-03-28 DIAGNOSIS — G40909 Epilepsy, unspecified, not intractable, without status epilepticus: Secondary | ICD-10-CM

## 2017-03-28 NOTE — Progress Notes (Signed)
She returns in follow up of her history of a hemorrhagic AVM and residual seizure disorder. She has had no further seizures and has been tolerating her carbamazepine well. Levels have been stable.    She has increased her workload but has noticed she has difficulty keeping up with the documentation. SHe and her husband have developed work arounds in the EMR. Nevertheless, she has enjoyed expanding her work effort and overall has not had any major downsides.     Her orthopedic issues (wrist, feet) are unchanged. No change in her mild sustension tremor, but it has affected her use of utensils.    Her ROS is otherwise notable for occasional headaches, heartburn and urinary leakage.    Exam:  Vitals:    03/28/17 0950   BP: 131/69   Pulse: 72   Weight: 53.5 kg (118 lb)   Height: 1.6 m (5\' 3" )     She is alert and oriented. Her speech is clear. Comprehension is intact. Visual fields are full to confrontation. She continues to have very mild end gaze nystagmus bilaterally. Facial strength is symmetric. She has full strength throughout. There is no significant ataxia. She has a bilateral sustention tremor.    Impression:  Stable seizure disorder, well controlled on Tegretol. Will hold on any changes.    We discussed the potential need for family screening (21y.o son) given her history of an AVM and a cousin with an intracerebral hemorrhage possibly from an aneurysm.  THere is no absolute need to screen her son at this time but she will inquire about her cousin to better understand the cause of his hemorrhage.    No other changes at this time. Follow up in 6 months.

## 2017-03-31 ENCOUNTER — Other Ambulatory Visit: Payer: Self-pay | Admitting: Interventional Cardiology

## 2017-04-01 NOTE — Progress Notes (Signed)
Cardiology Office Note    Date:  04/02/2017   ID:  JACOBI RYANT, DOB 1955/12/21, MRN [id number]  PCP:  Binnie Rail, MD  Cardiologist: Sinclair Grooms, MD   Chief Complaint  Patient presents with  . Atrial Fibrillation  . Cardiac Valve Problem    History of Present Illness:  Ariel Simpson is a 61 y.o. female who presents for Mitral valve prolapse, significant mitral regurgitation resulting in mitral valve repair in 2013, paroxysmal atrial fibrillation preceding surgery, operative Maze, and remote history of PSVT antedating mitral valve regurgitation by 20 years.  She is doing well. She denies dyspnea, chest pain, prolonged palpitations, syncope, and edema. She has been very active without limitations. She exercises 4 times per week. There are no medication side effects. She has no upcoming trip to [place]. She denies orthopnea and PND.  Past Medical History:  Diagnosis Date  . CHF (congestive heart failure) ([facility])   . Heart murmur   . High cholesterol   . Hypertension   . Mitral regurgitation   . Mitral valve prolapse   . Paroxysmal atrial fibrillation ([place]) 03/11/2013  . Paroxysmal supraventricular tachycardia ([place])   . PONV (postoperative nausea and vomiting)    Dizziness and Nausea  . S/P Maze operation for atrial fibrillation 03/18/2013   Complete bilateral atrial lesion set using cryothermy and biopolar radiofrequency ablation with oversewing of LA appendage via right mini thoracotomy  . S/P mitral valve repair 03/18/2013   Complex valvuloplasty including triangular resection of posterior leaflet, artificial Gore-tex neocord placement x4 and 102mm Sorin Memo 3D ring annuloplasty via right mini thoracotomy  . Severe mitral regurgitation 03/03/2013   Acute on chronic with acute heart failure   . Shortness of breath     Past Surgical History:  Procedure Laterality Date  . ABDOMINAL HYSTERECTOMY     hernia repair  . CARDIAC CATHETERIZATION    . INTRAOPERATIVE  TRANSESOPHAGEAL ECHOCARDIOGRAM N/A 03/18/2013   Procedure: INTRAOPERATIVE TRANSESOPHAGEAL ECHOCARDIOGRAM;  Surgeon: Rexene Alberts, MD;  Location: [place] [facility];  Service: Open Heart Surgery;  Laterality: N/A;  . LEFT AND RIGHT HEART CATHETERIZATION WITH CORONARY ANGIOGRAM N/A 03/06/2013   Procedure: LEFT AND RIGHT HEART CATHETERIZATION WITH CORONARY ANGIOGRAM;  Surgeon: Sinclair Grooms, MD;  Location: [facility] CATH [facility];  Service: Cardiovascular;  Laterality: N/A;  . MAZE N/A 03/18/2013   Procedure: MAZE;  Surgeon: Rexene Alberts, MD;  Location: [place];  Service: Open Heart Surgery;  Laterality: N/A;  . MITRAL VALVE REPAIR Right 03/18/2013   Procedure: MINIMALLY INVASIVE MITRAL VALVE REPAIR (MVR);  Surgeon: Rexene Alberts, MD;  Location: [place];  Service: Open Heart Surgery;  Laterality: Right;  . vericose veins    . WISDOM TOOTH EXTRACTION      Current Medications: Outpatient Medications Prior to Visit  Medication Sig Dispense Refill  . aspirin 81 MG tablet Take 1 tablet (81 mg total) by mouth daily.    Marland Kitchen atorvastatin (LIPITOR) 20 MG tablet Take 1 tablet (20 mg total) by mouth daily. 90 tablet 3  . Calcium Carbonate-Vitamin D (CALCIUM + D PO) Take 2 capsules by mouth daily.    Marland Kitchen GLUCOSA-CHONDR-NA CHONDR-MSM PO Take 2 capsules by mouth daily.    . metoprolol succinate (TOPROL-XL) 50 MG 24 hr tablet TAKE 1 TABLET (50 MG TOTAL) BY MOUTH DAILY. 30 tablet 0  . Omega-3 Fatty Acids (FISH OIL PO) Take 2 capsules by mouth daily.    . polyethylene glycol (MIRALAX /  GLYCOLAX) packet Take 17 g by mouth daily.    Marland Kitchen spironolactone-hydrochlorothiazide (ALDACTAZIDE) 25-25 MG tablet TAKE 1/2 TABLET DAILY 45 tablet 1  . zolpidem (AMBIEN) 5 MG tablet Take 0.5 tablets (2.5 mg total) by mouth at bedtime as needed for sleep. 30 tablet 0   No facility-administered medications prior to visit.      Allergies:   Patient has no known allergies.   Social History   Social History  . Marital status: Married    Spouse name:  Fritz Pickerel  . Number of children: 3  . Years of education: BS   Occupational History  .  [employer]   Social History Main Topics  . Smoking status: Never Smoker  . Smokeless tobacco: Never Used  . Alcohol use 1.8 oz/week    3 Glasses of wine per week     Comment: occasional  . Drug use: No  . Sexual activity: Yes   Other Topics Concern  . None   Social History Narrative   Patient lives at home with her husband  Fritz Pickerel)  - second husband. Patient has college education B.S. 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   Caffeine- one cup daily.     Family History:  The patient's family history includes Colon cancer in her paternal grandmother; Down syndrome in her child; Hypertension in her father; Pancreatic cancer in her mother; Seizures in her child; Stomach cancer in her father and paternal grandfather; Thyroid cancer in her sister.   ROS:   Please see the history of present illness.    Recent fall resulting in back. Difficulty sitting. Slowly improving.  All other systems reviewed and are negative.   PHYSICAL EXAM:   VS:  BP 128/86 (BP Location: Left Arm)   Pulse (!) 59   Ht 5\' 7"  (1.702 m)   Wt 165 lb 12.8 oz (75.2 kg)   BMI 25.97 kg/m    GEN: Well nourished, well developed, in no acute distress  HEENT: normal  Neck: no JVD, carotid bruits, or masses Cardiac: RRR; no murmurs, rubs, or gallops,no edema  Respiratory:  clear to auscultation bilaterally, normal work of breathing GI: soft, nontender, nondistended, + BS MS: no deformity or atrophy  Skin: warm and dry, no rash Neuro:  Alert and Oriented x 3, Strength and sensation are intact Psych: euthymic mood, full affect  Wt Readings from Last 3 Encounters:  04/02/17 165 lb 12.8 oz (75.2 kg)  08/08/16 168 lb (76.2 kg)  07/05/16 168 lb 9.6 oz (76.5 kg)      Studies/Labs Reviewed:   EKG:  EKG  In August was normal. No dysrhythmia. No change from prior.  Recent Labs: 08/08/2016:  ALT 17; BUN 19; Creatinine, Ser 0.95; Hemoglobin 13.9; Platelets 215.0; Potassium 5.0; Sodium 140; TSH 2.42   Lipid Panel    Component Value Date/Time   CHOL 280 (H) 08/08/2016 1015   TRIG 151.0 (H) 08/08/2016 1015   HDL 54.60 08/08/2016 1015   CHOLHDL 5 08/08/2016 1015   VLDL 30.2 08/08/2016 1015   LDLCALC 195 (H) 08/08/2016 1015    Additional studies/ records that were reviewed today include:  No new data    ASSESSMENT:    1. Paroxysmal atrial fibrillation (HCC)   2. History of PSVT (paroxysmal supraventricular tachycardia)   3. S/P mitral valve repair   4. S/P Maze operation for atrial fibrillation   5. Other hyperlipidemia   6. Essential hypertension  PLAN:  In order of problems listed above:  1. Quiesced and without prolonged palpitations to suggest atrial fibrillation. 2. Quiescent. 3. No evidence of valve dysfunction or murmur. 4. Atrial dysrhythmias are under control since maze 5. Last LDL cholesterol was greater than 180. She is on atorvastatin but does not recall if this lab available from September is before after atorvastatin was started. Would get a lipid panel today. 6. We will do a comprehensive metabolic panel to follow up on electrolytes.  Clinical follow-up with me in one year. Lipids need to be better controlled. May need to strengthen antilipid therapy depending upon blood work    Medication Adjustments/Labs and Tests Ordered: Current medicines are reviewed at length with the patient today.  Concerns regarding medicines are outlined above.  Medication changes, Labs and Tests ordered today are listed in the Patient Instructions below. There are no Patient Instructions on file for this visit.   Signed, Sinclair Grooms, MD  04/02/2017 8:24 AM    [facility] [address], [place], [place]  [address] Phone: [phone]; Fax: [phone]

## 2017-04-02 ENCOUNTER — Encounter: Payer: Self-pay | Admitting: Interventional Cardiology

## 2017-04-02 ENCOUNTER — Ambulatory Visit (INDEPENDENT_AMBULATORY_CARE_PROVIDER_SITE_OTHER): Payer: BLUE CROSS/BLUE SHIELD | Admitting: Interventional Cardiology

## 2017-04-02 VITALS — BP 128/86 | HR 59 | Ht 67.0 in | Wt 165.8 lb

## 2017-04-02 DIAGNOSIS — E784 Other hyperlipidemia: Secondary | ICD-10-CM | POA: Diagnosis not present

## 2017-04-02 DIAGNOSIS — Z8679 Personal history of other diseases of the circulatory system: Secondary | ICD-10-CM | POA: Diagnosis not present

## 2017-04-02 DIAGNOSIS — E7849 Other hyperlipidemia: Secondary | ICD-10-CM

## 2017-04-02 DIAGNOSIS — I10 Essential (primary) hypertension: Secondary | ICD-10-CM | POA: Diagnosis not present

## 2017-04-02 DIAGNOSIS — Z9889 Other specified postprocedural states: Secondary | ICD-10-CM

## 2017-04-02 DIAGNOSIS — I480 Paroxysmal atrial fibrillation: Secondary | ICD-10-CM | POA: Diagnosis not present

## 2017-04-02 LAB — COMPREHENSIVE METABOLIC PANEL
ALBUMIN: 4.4 g/dL (ref 3.6–4.8)
ALK PHOS: 72 IU/L (ref 39–117)
ALT: 17 IU/L (ref 0–32)
AST: 19 IU/L (ref 0–40)
Albumin/Globulin Ratio: 1.8 (ref 1.2–2.2)
BUN / CREAT RATIO: 17 (ref 12–28)
BUN: 18 mg/dL (ref 8–27)
Bilirubin Total: 0.5 mg/dL (ref 0.0–1.2)
CALCIUM: 10.1 mg/dL (ref 8.7–10.3)
CO2: 27 mmol/L (ref 18–29)
CREATININE: 1.03 mg/dL — AB (ref 0.57–1.00)
Chloride: 101 mmol/L (ref 96–106)
GFR calc Af Amer: 67 mL/min/{1.73_m2} (ref >59)
GFR calc non Af Amer: 58 mL/min/{1.73_m2} — ABNORMAL LOW (ref >59)
GLUCOSE: 92 mg/dL (ref 65–99)
Globulin, Total: 2.4 g/dL (ref 1.5–4.5)
Potassium: 4.7 mmol/L (ref 3.5–5.2)
Sodium: 144 mmol/L (ref 134–144)
Total Protein: 6.8 g/dL (ref 6.0–8.5)

## 2017-04-02 LAB — LIPID PANEL
CHOLESTEROL TOTAL: 156 mg/dL (ref 100–199)
Chol/HDL Ratio: 2.8 ratio (ref 0.0–4.4)
HDL: 56 mg/dL (ref >39)
LDL CALC: 86 mg/dL (ref 0–99)
TRIGLYCERIDES: 69 mg/dL (ref 0–149)
VLDL CHOLESTEROL CAL: 14 mg/dL (ref 5–40)

## 2017-04-02 MED ORDER — METOPROLOL SUCCINATE ER 50 MG PO TB24: 50.0000 mg | ORAL_TABLET | Freq: Every day | ORAL | 3 refills | Status: DC

## 2017-04-02 NOTE — Patient Instructions (Signed)
Medication Instructions:  Your physician recommends that you continue on your current medications as directed. Please refer to the Current Medication list given to you today.   Labwork: TODAY - cholesterol, complete metabolic panel   Testing/Procedures: None Ordered   Follow-Up: Your physician wants you to follow-up in: 1 year with Dr. Tamala Julian.  You will receive a reminder letter in the mail two months in advance. If you don't receive a letter, please call our office to schedule the follow-up appointment.   If you need a refill on your cardiac medications before your next appointment, please call your pharmacy.   Thank you for choosing [facility]! Christen Bame, RN [phone]

## 2017-04-02 NOTE — Telephone Encounter (Signed)
Medication Detail    Disp Refills Start End   metoprolol succinate (TOPROL-XL) 50 MG 24 hr tablet 90 tablet 3 04/02/2017    Sig - Route: Take 1 tablet (50 mg total) by mouth daily. - Oral   E-Prescribing Status: Receipt confirmed by pharmacy (04/02/2017 8:34 AM EDT)   Pharmacy   [id number] - [place], [address]

## 2017-04-03 ENCOUNTER — Telehealth: Payer: Self-pay | Admitting: *Deleted

## 2017-04-03 MED ORDER — ZOLPIDEM TARTRATE 5 MG PO TABS: 2.5000 mg | ORAL_TABLET | Freq: Every evening | ORAL | 0 refills | Status: DC | PRN

## 2017-04-03 NOTE — Telephone Encounter (Signed)
Rec'd call pt requesting refill on her ambien...Ariel Simpson

## 2017-04-03 NOTE — Telephone Encounter (Signed)
[place] controlled substance database checked.  Ok to fill medication.  

## 2017-04-03 NOTE — Telephone Encounter (Signed)
Called pt inform MD approved refill verified which pharmacy pt use. [facility] had to [facility] on [facility]...Johny Chess

## 2017-04-04 ENCOUNTER — Encounter: Payer: Self-pay | Admitting: Gastroenterology

## 2017-04-05 ENCOUNTER — Telehealth: Payer: Self-pay | Admitting: Neurology

## 2017-04-05 NOTE — Telephone Encounter (Signed)
Sent office note received at [place] to stroke division via interoffice mail.

## 2017-04-23 ENCOUNTER — Other Ambulatory Visit: Payer: Self-pay | Admitting: Gastroenterology

## 2017-05-16 ENCOUNTER — Telehealth: Payer: Self-pay | Admitting: Neurology

## 2017-05-16 NOTE — Telephone Encounter (Signed)
Returned call to the patient. She is not calling about herself: states she has a general question for Dr. Johna Sheriff.  She is calling about a family member who needs a referral to a neurologist for sensory changes.  INTERVENTION:  I will forward the message to Dr. Johna Sheriff

## 2017-05-16 NOTE — Telephone Encounter (Signed)
Patient is calling and states she has a concern about something with her son and would like to speak to see if he should become a patient.

## 2017-05-18 NOTE — Telephone Encounter (Signed)
Patient is calling again to speak with Dr.Benesch regarding message below.

## 2017-05-21 NOTE — Telephone Encounter (Signed)
Spoke with her today. Will arrange an appt for her son in General Neurology.  cb

## 2017-05-28 ENCOUNTER — Other Ambulatory Visit
Admission: RE | Admit: 2017-05-28 | Discharge: 2017-05-28 | Disposition: A | Discharge status: Home / Self Care | Payer: PRIVATE HEALTH INSURANCE | Source: Ambulatory Visit | Attending: Obstetrics | Admitting: Obstetrics

## 2017-05-28 DIAGNOSIS — Z1151 Encounter for screening for human papillomavirus (HPV): Secondary | ICD-10-CM | POA: Insufficient documentation

## 2017-05-28 DIAGNOSIS — Z124 Encounter for screening for malignant neoplasm of cervix: Secondary | ICD-10-CM | POA: Insufficient documentation

## 2017-05-30 LAB — GYN CYTOLOGY

## 2017-06-06 ENCOUNTER — Other Ambulatory Visit: Payer: Self-pay | Admitting: Interventional Cardiology

## 2017-06-06 DIAGNOSIS — I10 Essential (primary) hypertension: Secondary | ICD-10-CM

## 2017-06-06 MED ORDER — SPIRONOLACTONE-HCTZ 25-25 MG PO TABS: 0.5000 | ORAL_TABLET | Freq: Every day | ORAL | 2 refills | Status: DC

## 2017-06-25 ENCOUNTER — Other Ambulatory Visit
Admission: RE | Admit: 2017-06-25 | Discharge: 2017-06-25 | Disposition: A | Discharge status: Home / Self Care | Payer: PRIVATE HEALTH INSURANCE | Source: Ambulatory Visit | Attending: Primary Care | Admitting: Primary Care

## 2017-06-25 DIAGNOSIS — G40909 Epilepsy, unspecified, not intractable, without status epilepticus: Secondary | ICD-10-CM

## 2017-06-25 DIAGNOSIS — E039 Hypothyroidism, unspecified: Secondary | ICD-10-CM | POA: Insufficient documentation

## 2017-06-25 DIAGNOSIS — E785 Hyperlipidemia, unspecified: Secondary | ICD-10-CM | POA: Insufficient documentation

## 2017-06-25 DIAGNOSIS — E559 Vitamin D deficiency, unspecified: Secondary | ICD-10-CM | POA: Insufficient documentation

## 2017-06-25 LAB — CBC
Hematocrit: 39 % (ref 34–45)
Hemoglobin: 12.8 g/dL (ref 11.2–15.7)
MCH: 33 pg/cell — ABNORMAL HIGH (ref 26–32)
MCHC: 33 g/dL (ref 32–36)
MCV: 100 fL — ABNORMAL HIGH (ref 79–95)
Platelets: 227 10*3/uL (ref 160–370)
RBC: 3.9 MIL/uL (ref 3.9–5.2)
RDW: 13.5 % (ref 11.7–14.4)
WBC: 5.1 10*3/uL (ref 4.0–10.0)

## 2017-06-25 LAB — CK: CK: 62 U/L (ref 34–145)

## 2017-06-25 LAB — COMPREHENSIVE METABOLIC PANEL
ALT: 28 U/L (ref 0–35)
AST: 23 U/L (ref 0–35)
Albumin: 4.6 g/dL (ref 3.5–5.2)
Alk Phos: 59 U/L (ref 35–105)
Anion Gap: 13 (ref 7–16)
Bilirubin,Total: 0.2 mg/dL (ref 0.0–1.2)
CO2: 27 mmol/L (ref 20–28)
Calcium: 9.3 mg/dL (ref 8.6–10.2)
Chloride: 100 mmol/L (ref 96–108)
Creatinine: 0.64 mg/dL (ref 0.51–0.95)
GFR,Black: 109 *
GFR,Caucasian: 95 *
Glucose: 92 mg/dL (ref 60–99)
Lab: 19 mg/dL (ref 6–20)
Potassium: 4.2 mmol/L (ref 3.3–5.1)
Sodium: 140 mmol/L (ref 133–145)
Total Protein: 6.7 g/dL (ref 6.3–7.7)

## 2017-06-25 LAB — LIPID PANEL
Chol/HDL Ratio: 2
Cholesterol: 222 mg/dL — AB
HDL: 111 mg/dL
LDL Calculated: 97 mg/dL
Non HDL Cholesterol: 111 mg/dL
Triglycerides: 70 mg/dL

## 2017-06-25 LAB — TSH: TSH: 2.59 u[IU]/mL (ref 0.27–4.20)

## 2017-06-26 ENCOUNTER — Encounter: Payer: Self-pay | Admitting: Primary Care

## 2017-06-26 ENCOUNTER — Ambulatory Visit: Payer: No Typology Code available for payment source | Attending: Primary Care | Admitting: Primary Care

## 2017-06-26 VITALS — BP 112/64 | HR 72 | Ht 63.0 in | Wt 116.0 lb

## 2017-06-26 DIAGNOSIS — I10 Essential (primary) hypertension: Secondary | ICD-10-CM

## 2017-06-26 DIAGNOSIS — K589 Irritable bowel syndrome without diarrhea: Secondary | ICD-10-CM

## 2017-06-26 DIAGNOSIS — M810 Age-related osteoporosis without current pathological fracture: Secondary | ICD-10-CM

## 2017-06-26 DIAGNOSIS — E7800 Pure hypercholesterolemia, unspecified: Secondary | ICD-10-CM

## 2017-06-26 DIAGNOSIS — E034 Atrophy of thyroid (acquired): Secondary | ICD-10-CM

## 2017-06-26 LAB — PCMH FALL RISK ASSESSMENT

## 2017-06-26 LAB — PCMH FALL RISK PLAN

## 2017-06-26 LAB — PCMH DEPRESSION ASSESSMENT

## 2017-06-26 NOTE — Progress Notes (Signed)
Reason for Visit: Hypertension; Hyperlipidemia; Hyperthyroidism; and Osteoporosis    She has continued Prolia for osteoporosis, denies any new side effects.  No falls.  Is working with PT to improve strength and resistance     gerd--taking the aciphex, often painful stomach, is an annoyance more than anything else    Because of her scoliosis, she tilts to the left even when seated and developed a pressure ulcer on her left leg   The acute sore is gone, but she is always leaning more on her left leg.   Dr. Riesa Pope helped her treat the pressure ulcer, no resolved    She is limited in her walking because her feet. Working with PT and  chiropracter     Her day to day quality of life is poor as a result.     Reviewed labs  Hypothyroidism--TSH 2.59, taking levothyroxine  Hyperlipidemia--reviewed labs, Tchol 222, TG 70, HDL 111, LDL 97; denies myalgia. LFTs normal, CK normal.     Patient Active Problem List   Diagnosis Code    Reported Trauma Neck     Allergic rhinitis J30.9    Irritable bowel syndrome K58.9    Essential hypertension I10    Dysplastic Nevus I78.1    Rosacea L71.9    Osteoporosis M81.0    Hypothyroidism E03.9    Scoliosis M41.20    Urge Incontinence Of Urine N39.41    A-V Malformation Repair Supratentorial Complex     Seizure disorder G40.909    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    Ringing In The Ears (Tinnitus) H93.19    GERD (gastroesophageal reflux disease) K21.9    Pain in joint, ankle and foot M25.579    Weakness of foot M21.40    Hyperlipidemia E78.5     Current Outpatient Prescriptions   Medication    mupirocin (BACTROBAN) 2 % ointment    Non-System Medication    darifenacin (ENABLEX) 15 MG 24 hr tablet    RABEprazole (ACIPHEX) 20 MG tablet    levothyroxine (SYNTHROID, LEVOTHROID) 25 MCG tablet    ranitidine (ZANTAC) 150 MG tablet    amLODIPine (NORVASC) 2.5 MG tablet    atorvastatin (LIPITOR) 10 MG tablet    carBAMazepine (CARBATROL) 100 MG 12 hr capsule     carBAMazepine (TEGRETOL XR) 200 MG 12 hr tablet    triamcinolone (KENALOG) 0.1 % ointment    azelastine (ASTELIN) 0.1 % nasal spray    fluticasone (FLONASE) 50 MCG/ACT nasal spray    Calcium Carb-Cholecalciferol 600-200 MG-UNIT TABS    Denosumab (PROLIA SC)    loratadine (CLARITIN) 10 MG tablet    Multiple Vitamin (MULTIVITAMIN) per tablet    cholecalciferol (VITAMIN D) 1000 UNITS capsule     No current facility-administered medications for this visit.        Medication list reviewed and updated, no changes were made today    Exam: BP 116/62   Pulse 72   Ht 1.6 m (5\' 3" )   Wt 52.6 kg (116 lb)   BMI 20.55 kg/m2  Well-nourished alert and conversant, no acute distress.  Neck is supple without lad, lungs are CTA bilaterally, resp easy.  Cardiac exam reveals nl S1S2 RRR.  Abdomen is soft, nontender, normoactive BS appreciated.  No organomegaly noted.  Extremities reveal 2+DP and PT pulses and no edema.    A/P:  1.  Osteoporosis--will continue calcium, vitamin d, prolia and will refer to Ortho for her osteoporosis management. Continue weight bearing exercise  2.  HTN--well controlled on  amlodipine, no changes were made today  3.  Scoliosis--pt is already doing everything she can to conservatively manage, working on back, core, balance and posture  Advised sitting on blanket or cushion and changing positions to help prevent recurrence of the pressure ulcer on left leg  4. Hyperlipidemia--well controlled on atorvastatin, LFTs and CK normal, no changes were made  5. Generalized frailty and day to day limitations--discussed at length, still brainstorming on ways to improve quality of life. Might consider referring to a sex therapist to help them find ways to improve their intimate relationship in the context of their limitations.  6.  HCM--up to date on HCM, discussed shingrix, referred pt to insurance to check into coverage.  7.  Hypothyroidism--continue levothyroxine,   Follow up in three months

## 2017-06-26 NOTE — Patient Instructions (Signed)
Call insurance and see if they will cover shingrix

## 2017-06-27 LAB — VITAMIN D
25-OH VIT D2: <4 ng/mL
25-OH VIT D3: 53 ng/mL
25-OH Vit Total: 53 ng/mL (ref 30–60)

## 2017-06-28 LAB — CARBAMAZEPINE, FREE & TOTAL
Carbamazepine, Free: 22.8 % (ref 8.0–35.0)
Carbamazepine,Free: 2.6 ug/mL (ref 1.0–3.0)
Carbamazepine,Total: 11.4 ug/mL (ref 4.0–12.0)

## 2017-08-01 ENCOUNTER — Other Ambulatory Visit: Payer: Self-pay | Admitting: Internal Medicine

## 2017-08-15 ENCOUNTER — Other Ambulatory Visit: Payer: Self-pay | Admitting: Primary Care

## 2017-08-20 ENCOUNTER — Encounter: Payer: Self-pay | Admitting: Gastroenterology

## 2017-08-21 ENCOUNTER — Encounter: Payer: Self-pay | Admitting: Internal Medicine

## 2017-08-21 ENCOUNTER — Ambulatory Visit (INDEPENDENT_AMBULATORY_CARE_PROVIDER_SITE_OTHER): Payer: BLUE CROSS/BLUE SHIELD | Admitting: Internal Medicine

## 2017-08-21 VITALS — BP 128/82 | HR 53 | Temp 98.0°F | Resp 16 | Ht 67.0 in | Wt 175.0 lb

## 2017-08-21 DIAGNOSIS — I10 Essential (primary) hypertension: Secondary | ICD-10-CM | POA: Diagnosis not present

## 2017-08-21 DIAGNOSIS — G4700 Insomnia, unspecified: Secondary | ICD-10-CM

## 2017-08-21 DIAGNOSIS — Z1283 Encounter for screening for malignant neoplasm of skin: Secondary | ICD-10-CM | POA: Diagnosis not present

## 2017-08-21 DIAGNOSIS — E7849 Other hyperlipidemia: Secondary | ICD-10-CM

## 2017-08-21 DIAGNOSIS — Z1159 Encounter for screening for other viral diseases: Secondary | ICD-10-CM

## 2017-08-21 DIAGNOSIS — Z13820 Encounter for screening for osteoporosis: Secondary | ICD-10-CM | POA: Diagnosis not present

## 2017-08-21 DIAGNOSIS — R739 Hyperglycemia, unspecified: Secondary | ICD-10-CM | POA: Diagnosis not present

## 2017-08-21 DIAGNOSIS — Z0000 Encounter for general adult medical examination without abnormal findings: Secondary | ICD-10-CM | POA: Diagnosis not present

## 2017-08-21 DIAGNOSIS — M85851 Other specified disorders of bone density and structure, right thigh: Secondary | ICD-10-CM | POA: Diagnosis not present

## 2017-08-21 MED ORDER — ZOLPIDEM TARTRATE 5 MG PO TABS
2.5000 mg | ORAL_TABLET | Freq: Every evening | ORAL | 0 refills | Status: DC | PRN
Start: 2017-08-21 — End: 2018-04-27

## 2017-08-21 MED ORDER — ATORVASTATIN CALCIUM 20 MG PO TABS: 20.0000 mg | ORAL_TABLET | Freq: Every day | ORAL | 3 refills | Status: DC

## 2017-08-21 NOTE — Assessment & Plan Note (Signed)
dexa due this year - will order and she will have early next year Doing yoga regularly Taking calcium and vitamin d

## 2017-08-21 NOTE — Progress Notes (Signed)
Subjective:    Patient ID: Ariel Simpson, female    DOB: Jan 03, 1956, 61 y.o.   MRN: [id number]  HPI She is here for a physical exam.   No changes in health and no concerns.  Overall, she feels well.   Medications and allergies reviewed with patient and updated if appropriate.  Patient Active Problem List   Diagnosis Date Noted  . Osteopenia 09/23/2016  . Need for prophylactic antibiotic 09/23/2016  . Tremor, benign 08/08/2016  . Hyperlipidemia 08/08/2016  . Insomnia 08/08/2016  . Essential hypertension 02/24/2014  . S/P mitral valve repair 03/18/2013  . S/P Maze operation for atrial fibrillation 03/18/2013  . Paroxysmal atrial fibrillation ([place]) 03/11/2013  . History of PSVT (paroxysmal supraventricular tachycardia) 03/03/2013    Class: Chronic    Current Outpatient Prescriptions on File Prior to Visit  Medication Sig Dispense Refill  . aspirin 81 MG tablet Take 1 tablet (81 mg total) by mouth daily.    Marland Kitchen atorvastatin (LIPITOR) 20 MG tablet Take 1 tablet (20 mg total) by mouth daily. -- Office visit needed for further refills 30 tablet 0  . Calcium Carbonate-Vitamin D (CALCIUM + D PO) Take 2 capsules by mouth daily.    . metoprolol succinate (TOPROL-XL) 50 MG 24 hr tablet Take 1 tablet (50 mg total) by mouth daily. 90 tablet 3  . Omega-3 Fatty Acids (FISH OIL PO) Take 2 capsules by mouth daily.    . polyethylene glycol (MIRALAX / GLYCOLAX) packet Take 17 g by mouth daily.    Marland Kitchen spironolactone-hydrochlorothiazide (ALDACTAZIDE) 25-25 MG tablet Take 0.5 tablets by mouth daily. 45 tablet 2  . zolpidem (AMBIEN) 5 MG tablet Take 0.5 tablets (2.5 mg total) by mouth at bedtime as needed for sleep. 30 tablet 0   No current facility-administered medications on file prior to visit.     Past Medical History:  Diagnosis Date  . CHF (congestive heart failure) ([place])   . Heart murmur   . High cholesterol   . Hypertension   . Mitral regurgitation   . Mitral valve prolapse   .  Paroxysmal atrial fibrillation ([place]) 03/11/2013  . Paroxysmal supraventricular tachycardia ([place])   . PONV (postoperative nausea and vomiting)    Dizziness and Nausea  . S/P Maze operation for atrial fibrillation 03/18/2013   Complete bilateral atrial lesion set using cryothermy and biopolar radiofrequency ablation with oversewing of LA appendage via right mini thoracotomy  . S/P mitral valve repair 03/18/2013   Complex valvuloplasty including triangular resection of posterior leaflet, artificial Gore-tex neocord placement x4 and 38mm Sorin Memo 3D ring annuloplasty via right mini thoracotomy  . Severe mitral regurgitation 03/03/2013   Acute on chronic with acute heart failure   . Shortness of breath     Past Surgical History:  Procedure Laterality Date  . ABDOMINAL HYSTERECTOMY     hernia repair  . CARDIAC CATHETERIZATION    . INTRAOPERATIVE TRANSESOPHAGEAL ECHOCARDIOGRAM N/A 03/18/2013   Procedure: INTRAOPERATIVE TRANSESOPHAGEAL ECHOCARDIOGRAM;  Surgeon: Rexene Alberts, MD;  Location: [facility];  Service: Open Heart Surgery;  Laterality: N/A;  . LEFT AND RIGHT HEART CATHETERIZATION WITH CORONARY ANGIOGRAM N/A 03/06/2013   Procedure: LEFT AND RIGHT HEART CATHETERIZATION WITH CORONARY ANGIOGRAM;  Surgeon: Sinclair Grooms, MD;  Location: [facility] CATH LAB;  Service: Cardiovascular;  Laterality: N/A;  . MAZE N/A 03/18/2013   Procedure: MAZE;  Surgeon: Rexene Alberts, MD;  Location: [place];  Service: Open Heart Surgery;  Laterality: N/A;  . MITRAL  VALVE REPAIR Right 03/18/2013   Procedure: MINIMALLY INVASIVE MITRAL VALVE REPAIR (MVR);  Surgeon: Rexene Alberts, MD;  Location: [place];  Service: Open Heart Surgery;  Laterality: Right;  . vericose veins    . WISDOM TOOTH EXTRACTION      Social History   Social History  . Marital status: Married    Spouse name: Fritz Pickerel  . Number of children: 3  . Years of education: BS   Occupational History  .  [employer]   Social History Main Topics  .  Smoking status: Never Smoker  . Smokeless tobacco: Never Used  . Alcohol use 1.8 oz/week    3 Glasses of wine per week     Comment: occasional  . Drug use: No  . Sexual activity: Yes   Other Topics Concern  . None   Social History Narrative   Patient lives at home with her husband  Fritz Pickerel)  - second husband. Patient has college education B.S. 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   Caffeine- one cup daily.    Family History  Problem Relation Age of Onset  . Pancreatic cancer Mother   . Stomach cancer Father   . Hypertension Father   . Thyroid cancer Sister   . Colon cancer Paternal Grandmother   . Stomach cancer Paternal Grandfather   . Seizures Child   . Down syndrome Child     Review of Systems  Constitutional: Negative for chills and fever.  HENT: Positive for sore throat (few days).   Eyes: Negative for visual disturbance.  Respiratory: Negative for cough, shortness of breath and wheezing.   Cardiovascular: Positive for palpitations. Negative for chest pain and leg swelling.  Gastrointestinal: Positive for constipation (controlled with miralax). Negative for abdominal pain, blood in stool, diarrhea and nausea.       GERD occ  Genitourinary: Negative for dysuria and hematuria.  Musculoskeletal: Positive for arthralgias (mild achiness and stiffness).  Skin: Negative for color change and rash.  Neurological: Negative for light-headedness and headaches.  Psychiatric/Behavioral: Negative for dysphoric mood. The patient is not nervous/anxious.        Objective:   Vitals:   08/21/17 1420  BP: 128/82  Pulse: (!) 53  Resp: 16  Temp: 98 F (36.7 C)  SpO2: 98%   Filed Weights   08/21/17 1420  Weight: 175 lb (79.4 kg)   Body mass index is 27.41 kg/m.  Wt Readings from Last 3 Encounters:  08/21/17 175 lb (79.4 kg)  04/02/17 165 lb 12.8 oz (75.2 kg)  08/08/16 168 lb (76.2 kg)     Physical Exam Constitutional: She appears  well-developed and well-nourished. No distress.  HENT:  Head: Normocephalic and atraumatic.  Right Ear: External ear normal. Normal ear canal and TM Left Ear: External ear normal.  Normal ear canal and TM Mouth/Throat: Oropharynx is clear and moist.  Eyes: Conjunctivae and EOM are normal.  Neck: Neck supple. No tracheal deviation present. No thyromegaly present.  No carotid bruit  Cardiovascular: Normal rate, regular rhythm and normal heart sounds.   No murmur heard.  No edema. Pulmonary/Chest: Effort normal and breath sounds normal. No respiratory distress. She has no wheezes. She has no rales.  Breast: deferred Abdominal: Soft. She exhibits no distension. There is no tenderness.  Lymphadenopathy: She has no cervical adenopathy.  Skin: Skin is warm and dry. She is not diaphoretic.  Psychiatric: She has a normal mood and affect. Her behavior  is normal.        Assessment & Plan:   Physical exam: Screening blood work   ordered Immunizations   Flu vaccine today,  Discussed shingrix Colonoscopy   Up to date - done at [facility] - dr Evette Doffing schooler  Mammogram   Up to date  Gyn - no longer goes, s/p hystectomy Dexa    Up to date  - due later this year -- will order Eye exams    Up to date  EKG      Last done 06/2016 Exercise  Yoga, did some personal training this past summer Weight  BMI acceptable for age Skin       no concerns, wonders about having a skin cancer screening - will refer to derm Substance abuse    None   Health Maintenance  Topic Date Due  . INFLUENZA VACCINE  02/18/2018 (Originally 06/20/2017)  . DEXA SCAN  09/28/2017  . MAMMOGRAM  01/05/2019  . COLONOSCOPY  05/12/2019  . TETANUS/TDAP  04/08/2022  . Hepatitis C Screening  Completed  . HIV Screening  Completed     See Problem List for Assessment and Plan of chronic medical problems.

## 2017-08-21 NOTE — Assessment & Plan Note (Signed)
Check lipid panel  Continue daily statin Regular exercise and healthy diet encouraged  

## 2017-08-21 NOTE — Assessment & Plan Note (Signed)
Check a1c Exercising regularly, eats healthy

## 2017-08-21 NOTE — Assessment & Plan Note (Signed)
BP well controlled Current regimen effective and well tolerated Continue current medications at current doses cmp  

## 2017-08-21 NOTE — Patient Instructions (Addendum)
Test(s) ordered today. Your results will be released to MyChart (or called to you) after review, usually within 72hours after test completion. If any changes need to be made, you will be notified at that same time.  All other Health Maintenance issues reviewed.   All recommended immunizations and age-appropriate screenings are up-to-date or discussed.  No immunizations administered today.   Medications reviewed and updated.  No changes recommended at this time.   Please followup in 6 months   Health Maintenance, Female Adopting a healthy lifestyle and getting preventive care can go a long way to promote health and wellness. Talk with your health care provider about what schedule of regular examinations is right for you. This is a good chance for you to check in with your provider about disease prevention and staying healthy. In between checkups, there are plenty of things you can do on your own. Experts have done a lot of research about which lifestyle changes and preventive measures are most likely to keep you healthy. Ask your health care provider for more information. Weight and diet Eat a healthy diet  Be sure to include plenty of vegetables, fruits, low-fat dairy products, and lean protein.  Do not eat a lot of foods high in solid fats, added sugars, or salt.  Get regular exercise. This is one of the most important things you can do for your health. ? Most adults should exercise for at least 150 minutes each week. The exercise should increase your heart rate and make you sweat (moderate-intensity exercise). ? Most adults should also do strengthening exercises at least twice a week. This is in addition to the moderate-intensity exercise.  Maintain a healthy weight  Body mass index (BMI) is a measurement that can be used to identify possible weight problems. It estimates body fat based on height and weight. Your health care provider can help determine your BMI and help you achieve or  maintain a healthy weight.  For females 20 years of age and older: ? A BMI below 18.5 is considered underweight. ? A BMI of 18.5 to 24.9 is normal. ? A BMI of 25 to 29.9 is considered overweight. ? A BMI of 30 and above is considered obese.  Watch levels of cholesterol and blood lipids  You should start having your blood tested for lipids and cholesterol at 63 years of age, then have this test every 5 years.  You may need to have your cholesterol levels checked more often if: ? Your lipid or cholesterol levels are high. ? You are older than 63 years of age. ? You are at high risk for heart disease.  Cancer screening Lung Cancer  Lung cancer screening is recommended for adults 55-80 years old who are at high risk for lung cancer because of a history of smoking.  A yearly low-dose CT scan of the lungs is recommended for people who: ? Currently smoke. ? Have quit within the past 15 years. ? Have at least a 30-pack-year history of smoking. A pack year is smoking an average of one pack of cigarettes a day for 1 year.  Yearly screening should continue until it has been 15 years since you quit.  Yearly screening should stop if you develop a health problem that would prevent you from having lung cancer treatment.  Breast Cancer  Practice breast self-awareness. This means understanding how your breasts normally appear and feel.  It also means doing regular breast self-exams. Let your health care provider know about any changes,   changes, no matter how small.  If you are in your 20s or 30s, you should have a clinical breast exam (CBE) by a health care provider every 1-3 years as part of a regular health exam.  If you are 40 or older, have a CBE every year. Also consider having a breast X-ray (mammogram) every year.  If you have a family history of breast cancer, talk to your health care provider about genetic screening.  If you are at high risk for breast cancer, talk to your health care  provider about having an MRI and a mammogram every year.  Breast cancer gene (BRCA) assessment is recommended for women who have family members with BRCA-related cancers. BRCA-related cancers include: ? Breast. ? Ovarian. ? Tubal. ? Peritoneal cancers.  Results of the assessment will determine the need for genetic counseling and BRCA1 and BRCA2 testing.  Cervical Cancer Your health care provider may recommend that you be screened regularly for cancer of the pelvic organs (ovaries, uterus, and vagina). This screening involves a pelvic examination, including checking for microscopic changes to the surface of your cervix (Pap test). You may be encouraged to have this screening done every 3 years, beginning at age 21.  For women ages 30-65, health care providers may recommend pelvic exams and Pap testing every 3 years, or they may recommend the Pap and pelvic exam, combined with testing for human papilloma virus (HPV), every 5 years. Some types of HPV increase your risk of cervical cancer. Testing for HPV may also be done on women of any age with unclear Pap test results.  Other health care providers may not recommend any screening for nonpregnant women who are considered low risk for pelvic cancer and who do not have symptoms. Ask your health care provider if a screening pelvic exam is right for you.  If you have had past treatment for cervical cancer or a condition that could lead to cancer, you need Pap tests and screening for cancer for at least 20 years after your treatment. If Pap tests have been discontinued, your risk factors (such as having a new sexual partner) need to be reassessed to determine if screening should resume. Some women have medical problems that increase the chance of getting cervical cancer. In these cases, your health care provider may recommend more frequent screening and Pap tests.  Colorectal Cancer  This type of cancer can be detected and often prevented.  Routine  colorectal cancer screening usually begins at 63 years of age and continues through 63 years of age.  Your health care provider may recommend screening at an earlier age if you have risk factors for colon cancer.  Your health care provider may also recommend using home test kits to check for hidden blood in the stool.  A small camera at the end of a tube can be used to examine your colon directly (sigmoidoscopy or colonoscopy). This is done to check for the earliest forms of colorectal cancer.  Routine screening usually begins at age 50.  Direct examination of the colon should be repeated every 5-10 years through 63 years of age. However, you may need to be screened more often if early forms of precancerous polyps or small growths are found.  Skin Cancer  Check your skin from head to toe regularly.  Tell your health care provider about any new moles or changes in moles, especially if there is a change in a mole's shape or color.  Also tell your health care provider if   you have a mole that is larger than the size of a pencil eraser.  Always use sunscreen. Apply sunscreen liberally and repeatedly throughout the day.  Protect yourself by wearing long sleeves, pants, a wide-brimmed hat, and sunglasses whenever you are outside.  Heart disease, diabetes, and high blood pressure  High blood pressure causes heart disease and increases the risk of stroke. High blood pressure is more likely to develop in: ? People who have blood pressure in the high end of the normal range (130-139/85-89 mm Hg). ? People who are overweight or obese. ? People who are African American.  If you are 55-1 years of age, have your blood pressure checked every 3-5 years. If you are 86 years of age or older, have your blood pressure checked every year. You should have your blood pressure measured twice-once when you are at a hospital or clinic, and once when you are not at a hospital or clinic. Record the average of the  two measurements. To check your blood pressure when you are not at a hospital or clinic, you can use: ? An automated blood pressure machine at a pharmacy. ? A home blood pressure monitor.  If you are between 15 years and 81 years old, ask your health care provider if you should take aspirin to prevent strokes.  Have regular diabetes screenings. This involves taking a blood sample to check your fasting blood sugar level. ? If you are at a normal weight and have a low risk for diabetes, have this test once every three years after 63 years of age. ? If you are overweight and have a high risk for diabetes, consider being tested at a younger age or more often. Preventing infection Hepatitis B  If you have a higher risk for hepatitis B, you should be screened for this virus. You are considered at high risk for hepatitis B if: ? You were born in a country where hepatitis B is common. Ask your health care provider which countries are considered high risk. ? Your parents were born in a high-risk country, and you have not been immunized against hepatitis B (hepatitis B vaccine). ? You have HIV or AIDS. ? You use needles to inject street drugs. ? You live with someone who has hepatitis B. ? You have had sex with someone who has hepatitis B. ? You get hemodialysis treatment. ? You take certain medicines for conditions, including cancer, organ transplantation, and autoimmune conditions.  Hepatitis C  Blood testing is recommended for: ? Everyone born from [birth year] through 1967. ? Anyone with known risk factors for hepatitis C.  Sexually transmitted infections (STIs)  You should be screened for sexually transmitted infections (STIs) including gonorrhea and chlamydia if: ? You are sexually active and are younger than 63 years of age. ? You are older than 63 years of age and your health care provider tells you that you are at risk for this type of infection. ? Your sexual activity has changed since you  were last screened and you are at an increased risk for chlamydia or gonorrhea. Ask your health care provider if you are at risk.  If you do not have HIV, but are at risk, it may be recommended that you take a prescription medicine daily to prevent HIV infection. This is called pre-exposure prophylaxis (PrEP). You are considered at risk if: ? You are sexually active and do not regularly use condoms or know the HIV status of your partner(s). ? You take drugs by  injection. ? You are sexually active with a partner who has HIV.  Talk with your health care provider about whether you are at high risk of being infected with HIV. If you choose to begin PrEP, you should first be tested for HIV. You should then be tested every 3 months for as long as you are taking PrEP. Pregnancy  If you are premenopausal and you may become pregnant, ask your health care provider about preconception counseling.  If you may become pregnant, take 400 to 800 micrograms (mcg) of folic acid every day.  If you want to prevent pregnancy, talk to your health care provider about birth control (contraception). Osteoporosis and menopause  Osteoporosis is a disease in which the bones lose minerals and strength with aging. This can result in serious bone fractures. Your risk for osteoporosis can be identified using a bone density scan.  If you are 69 years of age or older, or if you are at risk for osteoporosis and fractures, ask your health care provider if you should be screened.  Ask your health care provider whether you should take a calcium or vitamin D supplement to lower your risk for osteoporosis.  Menopause may have certain physical symptoms and risks.  Hormone replacement therapy may reduce some of these symptoms and risks. Talk to your health care provider about whether hormone replacement therapy is right for you. Follow these instructions at home:  Schedule regular health, dental, and eye exams.  Stay current  with your immunizations.  Do not use any tobacco products including cigarettes, chewing tobacco, or electronic cigarettes.  If you are pregnant, do not drink alcohol.  If you are breastfeeding, limit how much and how often you drink alcohol.  Limit alcohol intake to no more than 1 drink per day for nonpregnant women. One drink equals 12 ounces of beer, 5 ounces of wine, or 1 ounces of hard liquor.  Do not use street drugs.  Do not share needles.  Ask your health care provider for help if you need support or information about quitting drugs.  Tell your health care provider if you often feel depressed.  Tell your health care provider if you have ever been abused or do not feel safe at home. This information is not intended to replace advice given to you by your health care provider. Make sure you discuss any questions you have with your health care provider. Document Released: 05/22/2011 Document Revised: 04/13/2016 Document Reviewed: 08/10/2015 Elsevier Interactive Patient Education  [employer].

## 2017-08-21 NOTE — Assessment & Plan Note (Signed)
Takes melatonin most nights Takes Lorrin Mais only as needed

## 2017-08-22 ENCOUNTER — Other Ambulatory Visit (INDEPENDENT_AMBULATORY_CARE_PROVIDER_SITE_OTHER): Payer: BLUE CROSS/BLUE SHIELD

## 2017-08-22 ENCOUNTER — Telehealth: Payer: Self-pay | Admitting: *Deleted

## 2017-08-22 DIAGNOSIS — E7849 Other hyperlipidemia: Secondary | ICD-10-CM | POA: Diagnosis not present

## 2017-08-22 DIAGNOSIS — R739 Hyperglycemia, unspecified: Secondary | ICD-10-CM

## 2017-08-22 DIAGNOSIS — M85851 Other specified disorders of bone density and structure, right thigh: Secondary | ICD-10-CM | POA: Diagnosis not present

## 2017-08-22 DIAGNOSIS — Z1159 Encounter for screening for other viral diseases: Secondary | ICD-10-CM

## 2017-08-22 DIAGNOSIS — I10 Essential (primary) hypertension: Secondary | ICD-10-CM | POA: Diagnosis not present

## 2017-08-22 DIAGNOSIS — Z0000 Encounter for general adult medical examination without abnormal findings: Secondary | ICD-10-CM | POA: Diagnosis not present

## 2017-08-22 LAB — COMPREHENSIVE METABOLIC PANEL
ALBUMIN: 4.3 g/dL (ref 3.5–5.2)
ALK PHOS: 63 U/L (ref 39–117)
ALT: 24 U/L (ref 0–35)
AST: 20 U/L (ref 0–37)
BILIRUBIN TOTAL: 0.6 mg/dL (ref 0.2–1.2)
BUN: 13 mg/dL (ref 6–23)
CALCIUM: 9.9 mg/dL (ref 8.4–10.5)
CO2: 31 mEq/L (ref 19–32)
CREATININE: 0.99 mg/dL (ref 0.40–1.20)
Chloride: 104 mEq/L (ref 96–112)
GFR: 60.07 mL/min (ref >60.00)
Glucose, Bld: 94 mg/dL (ref 70–99)
Potassium: 4.9 mEq/L (ref 3.5–5.1)
Sodium: 142 mEq/L (ref 135–145)
Total Protein: 6.9 g/dL (ref 6.0–8.3)

## 2017-08-22 LAB — LIPID PANEL
CHOLESTEROL: 164 mg/dL (ref 0–200)
HDL: 54.7 mg/dL (ref >39.00)
LDL Cholesterol: 84 mg/dL (ref 0–99)
NONHDL: 108.97
TRIGLYCERIDES: 126 mg/dL (ref 0.0–149.0)
Total CHOL/HDL Ratio: 3
VLDL: 25.2 mg/dL (ref 0.0–40.0)

## 2017-08-22 LAB — CBC WITH DIFFERENTIAL/PLATELET
BASOS PCT: 1.1 % (ref 0.0–3.0)
Basophils Absolute: 0 10*3/uL (ref 0.0–0.1)
EOS ABS: 0.1 10*3/uL (ref 0.0–0.7)
EOS PCT: 2.8 % (ref 0.0–5.0)
HEMATOCRIT: 40.3 % (ref 36.0–46.0)
HEMOGLOBIN: 13.6 g/dL (ref 12.0–15.0)
LYMPHS PCT: 34.1 % (ref 12.0–46.0)
Lymphs Abs: 1.3 10*3/uL (ref 0.7–4.0)
MCHC: 33.7 g/dL (ref 30.0–36.0)
MCV: 92.6 fl (ref 78.0–100.0)
Monocytes Absolute: 0.5 10*3/uL (ref 0.1–1.0)
Monocytes Relative: 13.7 % — ABNORMAL HIGH (ref 3.0–12.0)
Neutro Abs: 1.8 10*3/uL (ref 1.4–7.7)
Neutrophils Relative %: 48.3 % (ref 43.0–77.0)
Platelets: 182 10*3/uL (ref 150.0–400.0)
RBC: 4.35 Mil/uL (ref 3.87–5.11)
RDW: 13 % (ref 11.5–15.5)
WBC: 3.7 10*3/uL — AB (ref 4.0–10.5)

## 2017-08-22 LAB — HEMOGLOBIN A1C: HEMOGLOBIN A1C: 5.6 % (ref 4.6–6.5)

## 2017-08-22 LAB — TSH: TSH: 3.3 u[IU]/mL (ref 0.35–4.50)

## 2017-08-22 MED ORDER — ATORVASTATIN CALCIUM 20 MG PO TABS: 20.0000 mg | ORAL_TABLET | Freq: Every day | ORAL | 3 refills | Status: DC

## 2017-08-22 NOTE — Telephone Encounter (Signed)
Rec'd call pt states she saw MD yesterday, and ask for refills, but they was sent to wrong pharmacy. Pt states she no longer can use [facility] must get rx [facility]. [facility]. Inform pt will call [facility] first to cancel rx spoke w/Shelly and she cancel script, and then send to [facility]. Updated pharmacy...Johny [facility]ss

## 2017-08-23 ENCOUNTER — Encounter: Payer: Self-pay | Admitting: Internal Medicine

## 2017-08-23 LAB — HEPATITIS C ANTIBODY
HEP C AB: NONREACTIVE
SIGNAL TO CUT-OFF: 0 (ref <1.00)

## 2017-08-27 ENCOUNTER — Other Ambulatory Visit
Admission: RE | Admit: 2017-08-27 | Discharge: 2017-08-27 | Disposition: A | Discharge status: Home / Self Care | Payer: PRIVATE HEALTH INSURANCE | Source: Ambulatory Visit | Attending: Family | Admitting: Family

## 2017-08-27 DIAGNOSIS — M810 Age-related osteoporosis without current pathological fracture: Secondary | ICD-10-CM | POA: Insufficient documentation

## 2017-08-27 LAB — BASIC METABOLIC PANEL
Anion Gap: 11 (ref 7–16)
CO2: 27 mmol/L (ref 20–28)
Calcium: 9.3 mg/dL (ref 8.6–10.2)
Chloride: 99 mmol/L (ref 96–108)
Creatinine: 0.62 mg/dL (ref 0.51–0.95)
GFR,Black: 110 *
GFR,Caucasian: 96 *
Glucose: 103 mg/dL — ABNORMAL HIGH (ref 60–99)
Lab: 20 mg/dL (ref 6–20)
Potassium: 4.5 mmol/L (ref 3.3–5.1)
Sodium: 137 mmol/L (ref 133–145)

## 2017-08-27 LAB — MAGNESIUM: Magnesium: 1.8 mEq/L (ref 1.3–2.1)

## 2017-08-29 ENCOUNTER — Other Ambulatory Visit: Payer: Self-pay | Admitting: Internal Medicine

## 2017-09-01 ENCOUNTER — Other Ambulatory Visit: Payer: Self-pay | Admitting: Primary Care

## 2017-09-01 DIAGNOSIS — N3941 Urge incontinence: Secondary | ICD-10-CM

## 2017-09-01 MED ORDER — DARIFENACIN HYDROBROMIDE 15 MG PO TB24 *A*
15.0000 mg | ORAL_TABLET | Freq: Every day | ORAL | 0 refills | Status: DC
Start: 2017-09-01 — End: 2017-09-20

## 2017-09-04 ENCOUNTER — Ambulatory Visit: Payer: PRIVATE HEALTH INSURANCE | Attending: Primary Care

## 2017-09-04 ENCOUNTER — Other Ambulatory Visit
Admission: RE | Admit: 2017-09-04 | Discharge: 2017-09-04 | Disposition: A | Discharge status: Home / Self Care | Payer: PRIVATE HEALTH INSURANCE | Source: Ambulatory Visit | Attending: Primary Care | Admitting: Primary Care

## 2017-09-04 DIAGNOSIS — Z23 Encounter for immunization: Secondary | ICD-10-CM

## 2017-09-04 DIAGNOSIS — E785 Hyperlipidemia, unspecified: Secondary | ICD-10-CM

## 2017-09-04 DIAGNOSIS — G40909 Epilepsy, unspecified, not intractable, without status epilepticus: Secondary | ICD-10-CM | POA: Insufficient documentation

## 2017-09-04 DIAGNOSIS — E039 Hypothyroidism, unspecified: Secondary | ICD-10-CM

## 2017-09-04 DIAGNOSIS — E559 Vitamin D deficiency, unspecified: Secondary | ICD-10-CM

## 2017-09-04 LAB — CBC
Hematocrit: 41 % (ref 34–45)
Hemoglobin: 13.4 g/dL (ref 11.2–15.7)
MCH: 33 pg/cell — ABNORMAL HIGH (ref 26–32)
MCHC: 32 g/dL (ref 32–36)
MCV: 101 fL — ABNORMAL HIGH (ref 79–95)
Platelets: 251 10*3/uL (ref 160–370)
RBC: 4.1 MIL/uL (ref 3.9–5.2)
RDW: 13.1 % (ref 11.7–14.4)
WBC: 4.6 10*3/uL (ref 4.0–10.0)

## 2017-09-04 LAB — LIPID PANEL
Chol/HDL Ratio: 1.9
Cholesterol: 218 mg/dL — AB
HDL: 115 mg/dL
LDL Calculated: 90 mg/dL
Non HDL Cholesterol: 103 mg/dL
Triglycerides: 65 mg/dL

## 2017-09-04 LAB — COMPREHENSIVE METABOLIC PANEL
ALT: 25 U/L (ref 0–35)
AST: 22 U/L (ref 0–35)
Albumin: 4.7 g/dL (ref 3.5–5.2)
Alk Phos: 72 U/L (ref 35–105)
Anion Gap: 14 (ref 7–16)
Bilirubin,Total: <0.2 mg/dL (ref 0.0–1.2)
CO2: 28 mmol/L (ref 20–28)
Calcium: 10 mg/dL (ref 8.6–10.2)
Chloride: 97 mmol/L (ref 96–108)
Creatinine: 0.74 mg/dL (ref 0.51–0.95)
GFR,Black: 99 *
GFR,Caucasian: 86 *
Glucose: 82 mg/dL (ref 60–99)
Lab: 17 mg/dL (ref 6–20)
Potassium: 4.3 mmol/L (ref 3.3–5.1)
Sodium: 139 mmol/L (ref 133–145)
Total Protein: 7.1 g/dL (ref 6.3–7.7)

## 2017-09-04 LAB — TSH: TSH: 1.81 u[IU]/mL (ref 0.27–4.20)

## 2017-09-04 LAB — CK: CK: 58 U/L (ref 34–145)

## 2017-09-05 ENCOUNTER — Telehealth: Payer: Self-pay | Admitting: Primary Care

## 2017-09-05 NOTE — Telephone Encounter (Signed)
Would like recent lab results

## 2017-09-05 NOTE — Telephone Encounter (Signed)
Pt aware.

## 2017-09-05 NOTE — Telephone Encounter (Signed)
Also wanted to report that since last night she has had a "sense of shakiness", has persisted throughout the day.  Had her 1/2 flu shot yesterday and took tylenol last eve. That's the only things she has done differently.  She also feels congested.

## 2017-09-05 NOTE — Telephone Encounter (Signed)
Labs are entirely normal, cholesterol, muscle enzyme , liver tests  Blood counts all normal, electrolytes fine    Would hydrate, rest, tylenol is fine to take  Will let her know once we get the tegretol level

## 2017-09-06 LAB — VITAMIN D
25-OH VIT D2: <4 ng/mL
25-OH VIT D3: 51 ng/mL
25-OH Vit Total: 51 ng/mL (ref 30–60)

## 2017-09-06 LAB — CARBAMAZEPINE, FREE & TOTAL
Carbamazepine, Free: 22.6 % (ref 8.0–35.0)
Carbamazepine,Free: 2.1 ug/mL (ref 1.0–3.0)
Carbamazepine,Total: 9.3 ug/mL (ref 4.0–12.0)

## 2017-09-12 ENCOUNTER — Encounter: Payer: Self-pay | Admitting: Primary Care

## 2017-09-12 ENCOUNTER — Ambulatory Visit: Payer: PRIVATE HEALTH INSURANCE | Attending: Primary Care | Admitting: Primary Care

## 2017-09-12 VITALS — BP 102/78 | HR 68 | Temp 98.0°F | Wt 113.2 lb

## 2017-09-12 DIAGNOSIS — Z8679 Personal history of other diseases of the circulatory system: Secondary | ICD-10-CM

## 2017-09-12 DIAGNOSIS — J0101 Acute recurrent maxillary sinusitis: Secondary | ICD-10-CM

## 2017-09-12 DIAGNOSIS — J309 Allergic rhinitis, unspecified: Secondary | ICD-10-CM

## 2017-09-12 MED ORDER — DOXYCYCLINE HYCLATE 100 MG PO TABS *I*
100.0000 mg | ORAL_TABLET | Freq: Two times a day (BID) | ORAL | 0 refills | Status: DC
Start: 2017-09-12 — End: 2017-11-09

## 2017-09-12 NOTE — Progress Notes (Signed)
Reason for Visit: Sinusitis (with pain in left eye, congestion)    Patient has been fighting an increase in congestion since last week.  She thought it might be either a reaction to the flu shot or a virus.  However this morning she awoke with left eye pain, and got concerned.  She would not call it a frank headache and does not feel that the pain is neurologic to her.  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  Patient got up and took a warm shower and this did help with the pain.  She denies any crusting of the lid.  Initially when she awoke she felt that the eye was a bit scratchy as though something were in it but this cleared out.  Yesterday her eyes were itching and she did take an extra Claritin.    We did also discuss her tremulousness.  Patient has a known intention tremor and also has some degree of tremor secondary to her carbamazepine.  She does not feel that it is acutely worse with this illness although she and her husband agree that over the last year or so the tremor has gotten mildly worse   Patient Active Problem List   Diagnosis Code    Reported Trauma Neck     Allergic rhinitis J30.9    Irritable bowel syndrome K58.9    Essential hypertension I10    Dysplastic Nevus I78.1    Rosacea L71.9    Osteoporosis M81.0    Hypothyroidism E03.9    Scoliosis M41.20    Urge Incontinence Of Urine N39.41    A-V Malformation Repair Supratentorial Complex     Seizure disorder G40.909    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    Ringing In The Ears (Tinnitus) H93.19    GERD (gastroesophageal reflux disease) K21.9    Pain in joint, ankle and foot M25.579    Weakness of foot M21.40    Hyperlipidemia E78.5     Current Outpatient Prescriptions   Medication    darifenacin (ENABLEX) 15 MG 24 hr tablet    azelastine (ASTELIN) 0.1 % nasal spray    mupirocin (BACTROBAN) 2 % ointment    Non-System Medication    RABEprazole (ACIPHEX) 20 MG tablet     levothyroxine (SYNTHROID, LEVOTHROID) 25 MCG tablet    ranitidine (ZANTAC) 150 MG tablet    amLODIPine (NORVASC) 2.5 MG tablet    atorvastatin (LIPITOR) 10 MG tablet    carBAMazepine (CARBATROL) 100 MG 12 hr capsule    carBAMazepine (TEGRETOL XR) 200 MG 12 hr tablet    triamcinolone (KENALOG) 0.1 % ointment    fluticasone (FLONASE) 50 MCG/ACT nasal spray    Calcium Carb-Cholecalciferol 600-200 MG-UNIT TABS    Denosumab (PROLIA SC)    loratadine (CLARITIN) 10 MG tablet    Multiple Vitamin (MULTIVITAMIN) per tablet    cholecalciferol (VITAMIN D) 1000 UNITS capsule     No current facility-administered medications for this visit.        Medication list reviewed and updated, no changes were made today    Exam: BP 102/78   Pulse 68   Temp 36.7 C (98 F)   Wt 51.3 kg (113 lb 3.2 oz)   BMI 20.05 kg/m2  Well-nourished alert and conversant 61 year old lady, nontoxic.  Tympanic membranes with slight serous effusions but no significant erythema or exudate.  Nasal mucosa is intensely erythematous bilaterally, left worse than right with appreciable brown exudate and crusting.  Under the  left eye there is some mild erythema and puffiness.  No streaking.  No swelling or crusting of the lid.  No conjunctival injection.  Lungs are clear to auscultation bilaterally.  Cardiac exam reveals normal S1-S2 with regular rate and rhythm.  Cranial nerves II through XII are intact and symmetric.  No nystatin is with extraocular motions.  Patient does not have a pronator drift.  Finger to nose testing does reveal her tremor and initially she does miss her nose with her right finger but then repeatedly completes the test without difficulty aside from the tremor.      A/P:  1.  Acute maxillary sinusitis-patient was reassured that I believe her symptoms are consistent with a sinus infection.  I do not believe that her eye pain represents a stroke or underlying neurologic condition.  She does have puffiness under her eye which would  suggest infection rather than intracranial abnormality.  There are no concerning symptoms for an orbital cellulitis.  Patient was started on doxycycline.  She is to complete all of her typical medications for allergies.  She may use warm compresses on her eye.  We've just recently checked her carbamazepine level and this was not high.  There is no acute change in.  She can just follow-up as scheduled with her neurologist unless new sx develope

## 2017-09-18 ENCOUNTER — Ambulatory Visit: Payer: No Typology Code available for payment source

## 2017-09-19 ENCOUNTER — Ambulatory Visit: Payer: PRIVATE HEALTH INSURANCE | Attending: Primary Care

## 2017-09-19 DIAGNOSIS — Z23 Encounter for immunization: Secondary | ICD-10-CM

## 2017-09-19 NOTE — Progress Notes (Signed)
Pt tolerated 1/2 dose flu  injection as part of routine health maintenance.

## 2017-09-20 ENCOUNTER — Other Ambulatory Visit: Payer: Self-pay | Admitting: Neurology

## 2017-09-20 DIAGNOSIS — N3941 Urge incontinence: Secondary | ICD-10-CM

## 2017-09-21 ENCOUNTER — Ambulatory Visit: Payer: PRIVATE HEALTH INSURANCE | Admitting: Primary Care

## 2017-09-25 ENCOUNTER — Ambulatory Visit: Payer: PRIVATE HEALTH INSURANCE | Admitting: Neurology

## 2017-10-04 NOTE — Progress Notes (Signed)
Subjective:    Patient ID: Ariel Simpson, female    DOB: [birth year], 61 y.o.   MRN: [id number]  HPI The patient is here for [facility] for [facility]  UTI:  [facility] for urinary symptoms and was diagnosed with a UTI.  She was placed on macrobid and it helped instantly.  After the culture came back the macrobid was changed to bactrim.  She started to have twinges of discomfort in her urethra after starting the bactrim.  She took that for 3 days.  Her urine is clear, but she has occasional twinge of pain/zap in her urethra.  It does not occur daily.  She was concerned this was still going on - in the past she has had this with UTI's.    She did have some microscopic hematuria the original urinalysis. She did not see any blood.  She is drinking plenty of fluids.  She takes the occasional cranberry pill.      Medications and allergies reviewed with patient and updated if appropriate.  Patient Active Problem List   Diagnosis Date Noted  . Urinary tract infection without hematuria 10/05/2017  . Hyperglycemia 08/21/2017  . Osteopenia 09/23/2016  . Need for prophylactic antibiotic 09/23/2016  . Tremor, benign 08/08/2016  . Hyperlipidemia 08/08/2016  . Insomnia 08/08/2016  . S/P mitral valve repair 03/18/2013  . S/P Maze operation for atrial fibrillation 03/18/2013  . Paroxysmal atrial fibrillation ([place]) 03/11/2013  . History of PSVT (paroxysmal supraventricular tachycardia) 03/03/2013    Class: Chronic    Current Outpatient Medications on File Prior to Visit  Medication Sig Dispense Refill  . aspirin 81 MG tablet Take 1 tablet (81 mg total) by mouth daily.    Marland Kitchen atorvastatin (LIPITOR) 20 MG tablet Take 1 tablet (20 mg total) by mouth daily. 90 tablet 3  . Calcium Carbonate-Vitamin D (CALCIUM + D PO) Take 2 capsules by mouth daily.    . metoprolol succinate (TOPROL-XL) 50 MG 24 hr tablet Take 1 tablet (50 mg total) by mouth daily.  90 tablet 3  . Omega-3 Fatty Acids (FISH OIL PO) Take 2 capsules by mouth daily.    . polyethylene glycol (MIRALAX / GLYCOLAX) packet Take 17 g by mouth daily.    Marland Kitchen spironolactone-hydrochlorothiazide (ALDACTAZIDE) 25-25 MG tablet Take 0.5 tablets by mouth daily. 45 tablet 2  . TURMERIC PO Take by mouth daily.    Marland Kitchen zolpidem (AMBIEN) 5 MG tablet Take 0.5 tablets (2.5 mg total) by mouth at bedtime as needed for sleep. 30 tablet 0   No current facility-administered medications on file prior to visit.     Past Medical History:  Diagnosis Date  . CHF (congestive heart failure) ([place])   . Heart murmur   . High cholesterol   . Hypertension   . Mitral regurgitation   . Mitral valve prolapse   . Paroxysmal atrial fibrillation ([place]) 03/11/2013  . Paroxysmal supraventricular tachycardia ([place])   . PONV (postoperative nausea and vomiting)    Dizziness and Nausea  . S/P Maze operation for atrial fibrillation 03/18/2013   Complete bilateral atrial lesion set using cryothermy and biopolar radiofrequency ablation with oversewing of LA appendage via right mini thoracotomy  . S/P mitral valve repair 03/18/2013   Complex valvuloplasty including triangular resection of posterior leaflet, artificial Gore-tex neocord placement x4 and 26mm Sorin Memo 3D ring annuloplasty via right mini thoracotomy  . Severe mitral regurgitation 03/03/2013  Acute on chronic with acute heart failure   . Shortness of breath     Past Surgical History:  Procedure Laterality Date  . ABDOMINAL HYSTERECTOMY     hernia repair  . CANCELLED PROCEDURE  03/12/2013   Performed by Candee Furbish, MD at [facility]OSCOPY  . CARDIAC CATHETERIZATION    . INTRAOPERATIVE TRANSESOPHAGEAL ECHOCARDIOGRAM N/A 03/18/2013   Performed by Rexene Alberts, MD at [facility]  . LEFT AND RIGHT HEART CATHETERIZATION WITH CORONARY ANGIOGRAM N/A 03/06/2013   Performed by Sinclair Grooms, MD at [facility] CATH LAB  . MAZE N/A 03/18/2013   Performed by Rexene Alberts, MD  at [facility]  . MINIMALLY INVASIVE MITRAL VALVE REPAIR ([facility]) Right 03/18/2013   Performed by Rexene Alberts, MD at [facility] OR  . vericose veins    . WISDOM TOOTH EXTRACTION      Social History   Socioeconomic History  . Marital status: Married    Spouse name: Fritz Pickerel  . Number of children: 3  . Years of education: BS  . Highest education level: None  Social Needs  . Financial resource strain: None  . Food insecurity - worry: None  . Food insecurity - inability: None  . Transportation needs - medical: None  . Transportation needs - non-medical: None  Occupational History    Employer: [employer]  Tobacco Use  . Smoking status: Never Smoker  . Smokeless tobacco: Never Used  Substance and Sexual Activity  . Alcohol use: Yes    Alcohol/week: 3.0 - 3.6 oz    Types: 5 - 6 Glasses of wine per week    Comment: occasional  . Drug use: No  . Sexual activity: Yes  Other Topics Concern  . None  Social History Narrative   Patient lives at home with her husband  Fritz Pickerel)  - second husband. Patient has college education B.S. 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   Caffeine- one cup daily.    Family History  Problem Relation Age of Onset  . Pancreatic cancer Mother   . Stomach cancer Father   . Hypertension Father   . Thyroid cancer Sister   . Colon cancer Paternal Grandmother   . Stomach cancer Paternal Grandfather   . Seizures Child   . Down syndrome Child     Review of Systems  Constitutional: Negative for chills and fever.  Gastrointestinal: Negative for abdominal pain and nausea.  Genitourinary: Positive for urgency (once the other day). Negative for dysuria, frequency and hematuria.       Twinges of pain in urethra  Musculoskeletal: Negative for back pain.       Objective:   Vitals:   10/05/17 1046  BP: 118/80  Pulse: 64  Resp: 16  Temp: 97.9 F (36.6 C)  SpO2: 96%   Wt Readings from Last 3 Encounters:  10/05/17 175 lb (79.4  kg)  08/21/17 175 lb (79.4 kg)  04/02/17 165 lb 12.8 oz (75.2 kg)   Body mass index is 27.41 kg/m.   Physical Exam  Constitutional: She appears well-developed and well-nourished. No distress.  Abdominal: Soft. She exhibits no distension and no mass. There is no tenderness. There is no rebound and no guarding.  Genitourinary:  Genitourinary Comments: No flank or cva pain  Skin: She is not diaphoretic.          Assessment & Plan:    See Problem List for Assessment and Plan of  chronic medical problems.

## 2017-10-05 ENCOUNTER — Other Ambulatory Visit (INDEPENDENT_AMBULATORY_CARE_PROVIDER_SITE_OTHER): Payer: BLUE CROSS/BLUE SHIELD

## 2017-10-05 ENCOUNTER — Encounter: Payer: Self-pay | Admitting: Internal Medicine

## 2017-10-05 ENCOUNTER — Ambulatory Visit: Payer: BLUE CROSS/BLUE SHIELD | Admitting: Internal Medicine

## 2017-10-05 VITALS — BP 118/80 | HR 64 | Temp 97.9°F | Resp 16 | Wt 175.0 lb

## 2017-10-05 DIAGNOSIS — N390 Urinary tract infection, site not specified: Secondary | ICD-10-CM

## 2017-10-05 LAB — URINALYSIS, ROUTINE W REFLEX MICROSCOPIC
Bilirubin Urine: NEGATIVE
Hgb urine dipstick: NEGATIVE
KETONES UR: NEGATIVE
Nitrite: NEGATIVE
PH: 7 (ref 5.0–8.0)
RBC / HPF: NONE SEEN (ref 0–?)
SPECIFIC GRAVITY, URINE: 1.01 (ref 1.000–1.030)
Total Protein, Urine: NEGATIVE
Urine Glucose: NEGATIVE
Urobilinogen, UA: 0.2 (ref 0.0–1.0)

## 2017-10-05 NOTE — Patient Instructions (Signed)
Give a urine sample today.  Test(s) ordered today. Your results will be released to [place] (or called to you) after review, usually within 72hours after test completion. If any changes need to be made, you will be notified at that same time.   Medications reviewed and updated.   No changes recommended at this time.

## 2017-10-05 NOTE — Assessment & Plan Note (Signed)
Still having twinges of pain in her urethra, but no other urinary symptoms Check UA, UCx to confirm if infection has been completely cleared Will treat if needed -- if no infection and twinges persist will need to see urology Continue increased fluids

## 2017-10-06 ENCOUNTER — Encounter: Payer: Self-pay | Admitting: Internal Medicine

## 2017-10-06 LAB — URINE CULTURE
MICRO NUMBER:: 81295468
Result:: NO GROWTH
SPECIMEN QUALITY: ADEQUATE

## 2017-10-16 ENCOUNTER — Encounter: Payer: Self-pay | Admitting: Internal Medicine

## 2017-10-17 ENCOUNTER — Telehealth: Payer: Self-pay | Admitting: Primary Care

## 2017-10-17 NOTE — Telephone Encounter (Signed)
Pt said she had received hearing aids threw strong, a wile ago. She said they really arnt working out. She wants to know where else she could go.

## 2017-10-18 ENCOUNTER — Encounter: Payer: Self-pay | Admitting: Primary Care

## 2017-10-19 ENCOUNTER — Telehealth: Payer: Self-pay | Admitting: Primary Care

## 2017-10-19 NOTE — Telephone Encounter (Signed)
Pt read online that amlodipine is causing cancer and wonders if she should be concerned.  Advised she check with the pharmacy and find out if her brand is part of the recall.

## 2017-10-20 ENCOUNTER — Encounter: Payer: Self-pay | Admitting: Internal Medicine

## 2017-10-22 ENCOUNTER — Telehealth: Payer: Self-pay | Admitting: Primary Care

## 2017-10-22 NOTE — Telephone Encounter (Signed)
She should try [place] [facility] [place].  [place]

## 2017-10-22 NOTE — Telephone Encounter (Signed)
Would like a new referral for hearing aids.  [facility] doctor did not have any options for her.

## 2017-10-22 NOTE — Telephone Encounter (Signed)
Notified Pt via V/M

## 2017-10-22 NOTE — Telephone Encounter (Signed)
[facility], see other message.  Do we need to put some form of referral through? They are not in our system

## 2017-10-25 ENCOUNTER — Encounter: Payer: Self-pay | Admitting: Neurology

## 2017-10-25 ENCOUNTER — Ambulatory Visit: Payer: PRIVATE HEALTH INSURANCE | Attending: Neurology | Admitting: Neurology

## 2017-10-25 VITALS — BP 133/78 | HR 75 | Wt 115.0 lb

## 2017-10-25 DIAGNOSIS — R569 Unspecified convulsions: Secondary | ICD-10-CM

## 2017-10-26 NOTE — Progress Notes (Signed)
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  She was accompanied by her husband today.  Overall she is doing very well and has been slowly increasing her work load to now about 2-1/2 days per week.  She still has easy fatigability but is able to manage many aspects of her profession.    At times she becomes more fatigued and feels as if her cognition is slowed a bit.  Testing of her carbamazepine levels during the last 6 months of not revealed any elevation out of her usual range.  She has had no recurrent seizures.    Her review of systems is notable for reduced hearing bilaterally.  We discussed the possibility of obtaining a second opinion with one of the private cardiologist here in [place].    Examination:  Vitals:    10/25/17 1109   BP: 133/78   Pulse: 75   Weight: 52.2 kg (115 lb)     She is alert and oriented. Her speech is clear. Comprehension is intact. Visual fields are full to confrontation. She continues to have very mild end gaze nystagmus bilaterally. Facial strength is symmetric. She has full strength throughout. There is no significant ataxia. She has a bilateral sustention tremor.    Impression:  Stable seizure disorder, well controlled on Tegretol. Will hold on any changes.    I plan seeing her again in 6 months.  I will renew her standing order for carbamazepine levels.

## 2017-11-02 ENCOUNTER — Other Ambulatory Visit: Payer: Self-pay | Admitting: Primary Care

## 2017-11-02 DIAGNOSIS — E785 Hyperlipidemia, unspecified: Secondary | ICD-10-CM

## 2017-11-05 ENCOUNTER — Encounter: Payer: Self-pay | Admitting: Gastroenterology

## 2017-11-09 ENCOUNTER — Ambulatory Visit: Payer: PRIVATE HEALTH INSURANCE | Attending: Neurology | Admitting: Neurology

## 2017-11-09 ENCOUNTER — Encounter: Payer: Self-pay | Admitting: Neurology

## 2017-11-09 VITALS — BP 128/78 | HR 64 | Temp 98.4°F | Wt 116.4 lb

## 2017-11-09 DIAGNOSIS — J310 Chronic rhinitis: Secondary | ICD-10-CM

## 2017-11-09 DIAGNOSIS — J329 Chronic sinusitis, unspecified: Secondary | ICD-10-CM

## 2017-11-09 MED ORDER — DOXYCYCLINE HYCLATE 100 MG PO CAPS *I*
100.0000 mg | ORAL_CAPSULE | Freq: Two times a day (BID) | ORAL | 0 refills | Status: DC
Start: 2017-11-09 — End: 2017-12-28

## 2017-11-15 NOTE — Progress Notes (Signed)
SUBJECTIVE:  Presents with a four-day history of sinus congestion, increased postnasal drip, frontal headache.  Facial pressure.  Scratchy throat.  Expectorating scant amounts of clear nasal secretions.  Feels nauseous at times-attributes to PND.  Endorses fatigue.  Denies fever or chills.  Has been taking daily loratadine and Mucinex.  Has taken isolated doses of Sudafed.  Is using fluticasone nasal spray daily.    Medication regime reconciled.  Current Outpatient Prescriptions   Medication    RABEprazole (ACIPHEX) 20 MG tablet    atorvastatin (LIPITOR) 10 MG tablet    levothyroxine (SYNTHROID, LEVOTHROID) 25 MCG tablet    carBAMazepine (CARBATROL) 100 MG 12 hr capsule    triamcinolone (KENALOG) 0.1 % ointment    azelastine (ASTELIN) 0.1 % nasal spray    carBAMazepine (TEGRETOL XR) 200 MG 12 hr tablet    darifenacin (ENABLEX) 15 MG 24 hr tablet    mupirocin (BACTROBAN) 2 % ointment    Non-System Medication    ranitidine (ZANTAC) 150 MG tablet    amLODIPine (NORVASC) 2.5 MG tablet    fluticasone (FLONASE) 50 MCG/ACT nasal spray    Calcium Carb-Cholecalciferol 600-200 MG-UNIT TABS    Denosumab (PROLIA SC)    loratadine (CLARITIN) 10 MG tablet    Multiple Vitamin (MULTIVITAMIN) per tablet    cholecalciferol (VITAMIN D) 1000 UNITS capsule    doxycycline hyclate (VIBRAMYCIN) 100 MG capsule     No current facility-administered medications for this visit.        OBJECTIVE:  Blood pressure 128/78, pulse 64, temperature 36.9 C (98.4 F), weight 52.8 kg (116 lb 6.4 oz).  Pheobe Sandiford appears comfortable.  Accompanied by spouse.  Ear canals clear.  TMs pearly gray.  No erythema or effusion.  Maxillary sinus tenderness.  Slight decrease and transillumination bilaterally.  Posterior oropharynx 1+ erythematous.  No cobblestoning or patchy exudate.  Swallowing and gag reflexes intact.  Neck supple.  No cervical or supraclavicular lymphadenopathy.  Lungs fields clear.  RRR.    IMPRESSION/PLAN:  Acute  exacerbation of chronic rhinosinusitis.  Due to short symptom duration, have encouraged her to continue conservative management-  expectorant, fluticasone nasal spray, [place] saline nasal spray, humidified air, fluids.  Limited decongestant use.  Provided with a prescription for doxycycline to begin only if symptoms persist or worsen over the upcoming holiday.  FU with Dr Cloyde Reams as scheduled/pending symptom response.

## 2017-11-28 ENCOUNTER — Encounter: Payer: Self-pay | Admitting: Gastroenterology

## 2017-12-17 ENCOUNTER — Other Ambulatory Visit: Payer: Self-pay | Admitting: Gastroenterology

## 2017-12-19 ENCOUNTER — Telehealth: Payer: Self-pay | Admitting: Primary Care

## 2017-12-19 DIAGNOSIS — E039 Hypothyroidism, unspecified: Secondary | ICD-10-CM

## 2017-12-19 DIAGNOSIS — E785 Hyperlipidemia, unspecified: Secondary | ICD-10-CM

## 2017-12-19 NOTE — Telephone Encounter (Signed)
Pt wonders if she should have bloodwork before next appt.

## 2017-12-19 NOTE — Telephone Encounter (Signed)
Yes, I put the orders in

## 2017-12-24 ENCOUNTER — Other Ambulatory Visit
Admission: RE | Admit: 2017-12-24 | Discharge: 2017-12-24 | Disposition: A | Discharge status: Home / Self Care | Payer: PRIVATE HEALTH INSURANCE | Source: Ambulatory Visit | Attending: Primary Care | Admitting: Primary Care

## 2017-12-24 DIAGNOSIS — E785 Hyperlipidemia, unspecified: Secondary | ICD-10-CM | POA: Insufficient documentation

## 2017-12-24 DIAGNOSIS — R569 Unspecified convulsions: Secondary | ICD-10-CM

## 2017-12-24 DIAGNOSIS — E039 Hypothyroidism, unspecified: Secondary | ICD-10-CM | POA: Insufficient documentation

## 2017-12-24 LAB — LIPID PANEL
Chol/HDL Ratio: 2.1
Cholesterol: 212 mg/dL — AB
HDL: 102 mg/dL
LDL Calculated: 90 mg/dL
Non HDL Cholesterol: 110 mg/dL
Triglycerides: 102 mg/dL

## 2017-12-24 LAB — CBC
Hematocrit: 38 % (ref 34–45)
Hemoglobin: 12.1 g/dL (ref 11.2–15.7)
MCH: 31 pg/cell (ref 26–32)
MCHC: 32 g/dL (ref 32–36)
MCV: 100 fL — ABNORMAL HIGH (ref 79–95)
Platelets: 227 10*3/uL (ref 160–370)
RBC: 3.9 MIL/uL (ref 3.9–5.2)
RDW: 13.2 % (ref 11.7–14.4)
WBC: 5 10*3/uL (ref 4.0–10.0)

## 2017-12-24 LAB — AST: AST: 24 U/L (ref 0–35)

## 2017-12-24 LAB — MAGNESIUM: Magnesium: 2.2 mg/dL (ref 1.6–2.5)

## 2017-12-24 LAB — TSH: TSH: 2.28 u[IU]/mL (ref 0.27–4.20)

## 2017-12-24 LAB — ALT: ALT: 28 U/L (ref 0–35)

## 2017-12-24 LAB — MULTIPLE ORDERING DOCS

## 2017-12-25 LAB — TISSUE TRANSGLUT,IGA: tTG,IgA: <0.5 U/mL (ref 0.0–14.9)

## 2017-12-26 ENCOUNTER — Other Ambulatory Visit: Payer: Self-pay | Admitting: Primary Care

## 2017-12-27 LAB — CARBAMAZEPINE, FREE & TOTAL
Carbamazepine, Free: 22.6 % (ref 8.0–35.0)
Carbamazepine,Free: 2.3 ug/mL (ref 1.0–3.0)
Carbamazepine,Total: 10.2 ug/mL (ref 4.0–12.0)

## 2017-12-27 LAB — VITAMIN K: Vitamin K: 0.72 nmol/L (ref 0.22–4.88)

## 2017-12-28 ENCOUNTER — Ambulatory Visit: Payer: PRIVATE HEALTH INSURANCE | Attending: Primary Care | Admitting: Primary Care

## 2017-12-28 ENCOUNTER — Encounter: Payer: Self-pay | Admitting: Primary Care

## 2017-12-28 VITALS — BP 130/72 | HR 68 | Wt 115.0 lb

## 2017-12-28 DIAGNOSIS — I10 Essential (primary) hypertension: Secondary | ICD-10-CM

## 2017-12-28 DIAGNOSIS — K219 Gastro-esophageal reflux disease without esophagitis: Secondary | ICD-10-CM

## 2017-12-28 DIAGNOSIS — M810 Age-related osteoporosis without current pathological fracture: Secondary | ICD-10-CM

## 2017-12-28 DIAGNOSIS — E785 Hyperlipidemia, unspecified: Secondary | ICD-10-CM

## 2017-12-28 DIAGNOSIS — E039 Hypothyroidism, unspecified: Secondary | ICD-10-CM

## 2017-12-28 DIAGNOSIS — Z5181 Encounter for therapeutic drug level monitoring: Secondary | ICD-10-CM

## 2017-12-28 DIAGNOSIS — G40909 Epilepsy, unspecified, not intractable, without status epilepticus: Secondary | ICD-10-CM

## 2017-12-28 MED ORDER — GENERIC DME *A*
0 refills | Status: DC
Start: 2017-12-28 — End: 2022-01-25

## 2017-12-28 NOTE — NoShare Progress Note (Signed)
Reason for Visit: Follow-up    Pt comes in for routine follow up.    Hyperlipidemia--total cholesterol 212, TG 102, HDL 102, LDL 90.  Remains on atorvastatin and tolerating this well  LFTs normal  Hypothyroidism--TSH is 2.28.  Remains on levothyroxine.    HTN--BP well controlled on low dose amlodipine    Osteoporosis--she is concerned about using Prolia for an extended period.  She says she has been on it for six years now.  (started in 2013)  She just had a dexa and another injection   We discussed the possibility of checking another dexa in one year, to get a baseline, and then taking a drug holiday for a year. No frank indication to do so, but we are not sure if this is similar to the effects from the antiresorptives, so it is a risk benefit ratio.  Patient Active Problem List   Diagnosis Code    Reported Trauma Neck     Allergic rhinitis J30.9    Irritable bowel syndrome K58.9    Essential hypertension I10    Dysplastic Nevus I78.1    Rosacea L71.9    Osteoporosis M81.0    Hypothyroidism E03.9    Scoliosis M41.20    Urge Incontinence Of Urine N39.41    A-V Malformation Repair Supratentorial Complex     Seizure disorder G40.909    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    Ringing In The Ears (Tinnitus) H93.19    GERD (gastroesophageal reflux disease) K21.9    Pain in joint, ankle and foot M25.579    Weakness of foot M21.40    Hyperlipidemia E78.5     Current Outpatient Prescriptions   Medication    amLODIPine (NORVASC) 2.5 MG tablet    RABEprazole (ACIPHEX) 20 MG tablet    atorvastatin (LIPITOR) 10 MG tablet    levothyroxine (SYNTHROID, LEVOTHROID) 25 MCG tablet    carBAMazepine (CARBATROL) 100 MG 12 hr capsule    triamcinolone (KENALOG) 0.1 % ointment    azelastine (ASTELIN) 0.1 % nasal spray    carBAMazepine (TEGRETOL XR) 200 MG 12 hr tablet    darifenacin (ENABLEX) 15 MG 24 hr tablet    mupirocin (BACTROBAN) 2 % ointment    Non-System Medication    ranitidine (ZANTAC) 150  MG tablet    fluticasone (FLONASE) 50 MCG/ACT nasal spray    Calcium Carb-Cholecalciferol 600-200 MG-UNIT TABS    Denosumab (PROLIA SC)    loratadine (CLARITIN) 10 MG tablet    Multiple Vitamin (MULTIVITAMIN) per tablet    cholecalciferol (VITAMIN D) 1000 UNITS capsule     No current facility-administered medications for this visit.        Medication list reviewed and updated, no changes were made today    Exam: BP 130/72    Pulse 68    Wt 52.2 kg (115 lb)    BMI 20.37 kg/m   Frail 62 yo, no acute distress.  Neck is supple without lad, lungs are CTA bilaterally, resp easy.  Cardiac exam reveals nl S1S2 RRR.  Abdomen is soft, nontender, normoactive BS appreciated.  No organomegaly noted.  Extremities reveal 2+DP and PT pulses and no edema.    A/P:  1.  Osteoporosis--for now continue prolia  I will look into whether there is any info I can find as to whether a drug holiday is recommended  2. HTN--well controlled on amlodipine, no changes were made today  3.  Hyperlipidemia--continue atorvastatin  4.  Hypothyroidism--continue levothyroxine  5.  Allergic rhinitis--continue current  regimen  6.  Dr. Cathrine Muster would like pt tested for celiac disease, celiac TTG IgA was negative.  At this point, since she does not have GI sx nor evidence of significant vitamin deficiencies, I do not efeel that endoscopy with duodenal biopsy is necessary  F/u in three months    Pt due for Td, will discuss at next visit

## 2017-12-31 ENCOUNTER — Encounter: Payer: Self-pay | Admitting: Primary Care

## 2018-01-02 LAB — TISSUE TRANSGLUTAMINASE, IGG: tTG,IgG: 6 U/mL — ABNORMAL HIGH (ref 0–5)

## 2018-01-09 ENCOUNTER — Other Ambulatory Visit: Payer: Self-pay | Admitting: Primary Care

## 2018-01-13 ENCOUNTER — Other Ambulatory Visit: Payer: Self-pay | Admitting: Primary Care

## 2018-01-22 ENCOUNTER — Other Ambulatory Visit: Payer: Self-pay | Admitting: Primary Care

## 2018-02-18 ENCOUNTER — Other Ambulatory Visit
Admission: RE | Admit: 2018-02-18 | Discharge: 2018-02-18 | Disposition: A | Discharge status: Home / Self Care | Payer: PRIVATE HEALTH INSURANCE | Source: Ambulatory Visit | Attending: Primary Care | Admitting: Primary Care

## 2018-02-18 ENCOUNTER — Other Ambulatory Visit
Admission: RE | Admit: 2018-02-18 | Discharge: 2018-02-18 | Disposition: A | Discharge status: Home / Self Care | Payer: PRIVATE HEALTH INSURANCE | Source: Ambulatory Visit | Attending: Obstetrics | Admitting: Obstetrics

## 2018-02-18 DIAGNOSIS — E039 Hypothyroidism, unspecified: Secondary | ICD-10-CM | POA: Insufficient documentation

## 2018-02-18 DIAGNOSIS — E785 Hyperlipidemia, unspecified: Secondary | ICD-10-CM

## 2018-02-18 DIAGNOSIS — Z5181 Encounter for therapeutic drug level monitoring: Secondary | ICD-10-CM | POA: Insufficient documentation

## 2018-02-18 DIAGNOSIS — R569 Unspecified convulsions: Secondary | ICD-10-CM | POA: Insufficient documentation

## 2018-02-18 DIAGNOSIS — M810 Age-related osteoporosis without current pathological fracture: Secondary | ICD-10-CM | POA: Insufficient documentation

## 2018-02-18 DIAGNOSIS — K900 Celiac disease: Secondary | ICD-10-CM | POA: Insufficient documentation

## 2018-02-18 DIAGNOSIS — E561 Deficiency of vitamin K: Secondary | ICD-10-CM | POA: Insufficient documentation

## 2018-02-18 DIAGNOSIS — M818 Other osteoporosis without current pathological fracture: Secondary | ICD-10-CM | POA: Insufficient documentation

## 2018-02-18 DIAGNOSIS — E612 Magnesium deficiency: Secondary | ICD-10-CM | POA: Insufficient documentation

## 2018-02-18 DIAGNOSIS — I10 Essential (primary) hypertension: Secondary | ICD-10-CM | POA: Insufficient documentation

## 2018-02-18 LAB — COMPREHENSIVE METABOLIC PANEL
ALT: 24 U/L (ref 0–35)
AST: 23 U/L (ref 0–35)
Albumin: 4.7 g/dL (ref 3.5–5.2)
Alk Phos: 65 U/L (ref 35–105)
Anion Gap: 11 (ref 7–16)
Bilirubin,Total: 0.3 mg/dL (ref 0.0–1.2)
CO2: 29 mmol/L — ABNORMAL HIGH (ref 20–28)
Calcium: 9.6 mg/dL (ref 8.6–10.2)
Chloride: 98 mmol/L (ref 96–108)
Creatinine: 0.61 mg/dL (ref 0.51–0.95)
GFR,Black: 111 *
GFR,Caucasian: 96 *
Glucose: 91 mg/dL (ref 60–99)
Lab: 14 mg/dL (ref 6–20)
Potassium: 4.1 mmol/L (ref 3.3–5.1)
Sodium: 138 mmol/L (ref 133–145)
Total Protein: 7 g/dL (ref 6.3–7.7)

## 2018-02-18 LAB — CBC
Hematocrit: 41 % (ref 34–45)
Hemoglobin: 13.3 g/dL (ref 11.2–15.7)
MCH: 33 pg/cell — ABNORMAL HIGH (ref 26–32)
MCHC: 32 g/dL (ref 32–36)
MCV: 101 fL — ABNORMAL HIGH (ref 79–95)
Platelets: 220 10*3/uL (ref 160–370)
RBC: 4.1 MIL/uL (ref 3.9–5.2)
RDW: 13.1 % (ref 11.7–14.4)
WBC: 5.7 10*3/uL (ref 4.0–10.0)

## 2018-02-18 LAB — BASIC METABOLIC PANEL
Anion Gap: 11 (ref 7–16)
Anion Gap: 12 (ref 7–16)
CO2: 28 mmol/L (ref 20–28)
CO2: 29 mmol/L — ABNORMAL HIGH (ref 20–28)
Calcium: 9.8 mg/dL (ref 8.6–10.2)
Calcium: 9.8 mg/dL (ref 8.6–10.2)
Chloride: 98 mmol/L (ref 96–108)
Chloride: 99 mmol/L (ref 96–108)
Creatinine: 0.61 mg/dL (ref 0.51–0.95)
Creatinine: 0.61 mg/dL (ref 0.51–0.95)
GFR,Black: 111 *
GFR,Black: 111 *
GFR,Caucasian: 96 *
GFR,Caucasian: 96 *
Glucose: 90 mg/dL (ref 60–99)
Glucose: 90 mg/dL (ref 60–99)
Lab: 15 mg/dL (ref 6–20)
Lab: 15 mg/dL (ref 6–20)
Potassium: 4.1 mmol/L (ref 3.3–5.1)
Potassium: 4.2 mmol/L (ref 3.3–5.1)
Sodium: 138 mmol/L (ref 133–145)
Sodium: 139 mmol/L (ref 133–145)

## 2018-02-18 LAB — LIPID PANEL
Chol/HDL Ratio: 2
Cholesterol: 208 mg/dL — AB
HDL: 103 mg/dL
LDL Calculated: 87 mg/dL
Non HDL Cholesterol: 105 mg/dL
Triglycerides: 89 mg/dL

## 2018-02-18 LAB — TSH: TSH: 1.97 u[IU]/mL (ref 0.27–4.20)

## 2018-02-18 LAB — VITAMIN B12: Vitamin B12: 949 pg/mL (ref 232–1245)

## 2018-02-18 LAB — MAGNESIUM: Magnesium: 2.2 mg/dL (ref 1.6–2.5)

## 2018-02-18 LAB — IRON: Iron: 86 ug/dL (ref 34–165)

## 2018-02-18 LAB — MULTIPLE ORDERING DOCS

## 2018-02-19 ENCOUNTER — Ambulatory Visit (INDEPENDENT_AMBULATORY_CARE_PROVIDER_SITE_OTHER): Payer: BLUE CROSS/BLUE SHIELD | Admitting: Internal Medicine

## 2018-02-19 ENCOUNTER — Other Ambulatory Visit (INDEPENDENT_AMBULATORY_CARE_PROVIDER_SITE_OTHER): Payer: BLUE CROSS/BLUE SHIELD

## 2018-02-19 ENCOUNTER — Encounter: Payer: Self-pay | Admitting: Internal Medicine

## 2018-02-19 VITALS — BP 132/80 | HR 51 | Temp 97.6°F | Resp 16 | Wt 171.0 lb

## 2018-02-19 DIAGNOSIS — R251 Tremor, unspecified: Secondary | ICD-10-CM

## 2018-02-19 DIAGNOSIS — I480 Paroxysmal atrial fibrillation: Secondary | ICD-10-CM

## 2018-02-19 DIAGNOSIS — E7849 Other hyperlipidemia: Secondary | ICD-10-CM | POA: Diagnosis not present

## 2018-02-19 DIAGNOSIS — R739 Hyperglycemia, unspecified: Secondary | ICD-10-CM

## 2018-02-19 DIAGNOSIS — G4700 Insomnia, unspecified: Secondary | ICD-10-CM

## 2018-02-19 LAB — COMPREHENSIVE METABOLIC PANEL
ALT: 21 U/L (ref 0–35)
AST: 21 U/L (ref 0–37)
Albumin: 4.5 g/dL (ref 3.5–5.2)
Alkaline Phosphatase: 61 U/L (ref 39–117)
BUN: 16 mg/dL (ref 6–23)
CALCIUM: 10.2 mg/dL (ref 8.4–10.5)
CO2: 30 meq/L (ref 19–32)
Chloride: 102 mEq/L (ref 96–112)
Creatinine, Ser: 0.87 mg/dL (ref 0.40–1.20)
GFR: 69.63 mL/min (ref >60.00)
Glucose, Bld: 95 mg/dL (ref 70–99)
POTASSIUM: 4.6 meq/L (ref 3.5–5.1)
SODIUM: 139 meq/L (ref 135–145)
Total Bilirubin: 0.6 mg/dL (ref 0.2–1.2)
Total Protein: 7.2 g/dL (ref 6.0–8.3)

## 2018-02-19 LAB — CBC WITH DIFFERENTIAL/PLATELET
BASOS ABS: 0.1 10*3/uL (ref 0.0–0.1)
Basophils Relative: 0.9 % (ref 0.0–3.0)
EOS ABS: 0.2 10*3/uL (ref 0.0–0.7)
Eosinophils Relative: 2.4 % (ref 0.0–5.0)
HEMATOCRIT: 39.9 % (ref 36.0–46.0)
Hemoglobin: 13.8 g/dL (ref 12.0–15.0)
LYMPHS PCT: 31.7 % (ref 12.0–46.0)
Lymphs Abs: 2.1 10*3/uL (ref 0.7–4.0)
MCHC: 34.5 g/dL (ref 30.0–36.0)
MCV: 90.8 fl (ref 78.0–100.0)
Monocytes Absolute: 0.5 10*3/uL (ref 0.1–1.0)
Monocytes Relative: 7.9 % (ref 3.0–12.0)
NEUTROS PCT: 57.1 % (ref 43.0–77.0)
Neutro Abs: 3.7 10*3/uL (ref 1.4–7.7)
PLATELETS: 216 10*3/uL (ref 150.0–400.0)
RBC: 4.39 Mil/uL (ref 3.87–5.11)
RDW: 12.8 % (ref 11.5–15.5)
WBC: 6.5 10*3/uL (ref 4.0–10.5)

## 2018-02-19 LAB — HEMOGLOBIN A1C: Hgb A1c MFr Bld: 5.5 % (ref 4.6–6.5)

## 2018-02-19 LAB — LIPID PANEL
CHOLESTEROL: 162 mg/dL (ref 0–200)
HDL: 54.7 mg/dL (ref >39.00)
LDL CALC: 70 mg/dL (ref 0–99)
NonHDL: 107.74
TRIGLYCERIDES: 190 mg/dL — AB (ref 0.0–149.0)
Total CHOL/HDL Ratio: 3
VLDL: 38 mg/dL (ref 0.0–40.0)

## 2018-02-19 LAB — TSH: TSH: 2.32 u[IU]/mL (ref 0.35–4.50)

## 2018-02-19 NOTE — Assessment & Plan Note (Signed)
S/p maze Follows with cardiology Has occasional palpitations Tsh, cmp

## 2018-02-19 NOTE — Progress Notes (Signed)
Subjective:    Patient ID: Ariel Simpson, female    DOB: [birth year], 62 y.o.   MRN: [id number]  HPI The patient is here for follow up.  Hyperlipidemia: She is taking her medication daily. She is compliant with a low fat/cholesterol diet. She is exercising regularly. She denies myalgias.   Hyperglycemia:  She is compliant with a low sugar/carbohydrate diet.  She is exercising regularly.  Paroxysmal atrial fibrillation: She is following with cardiology.  She is taking her medication daily as prescribed.  She has occasional palpitations.  She denies any chest pain, shortness of breath or lightheadedness.  Insomnia: She takes melatonin most nights, but will take Ambien on occasion if needed.  She typically only takes half a pill or 2.5 mg.  Medication works well and she denies any side effects.  Tremor:  She has an intermittent tremor in her right hand and right leg.  She has been bothered by it more recently.  She has not seen any pattern with it or obvious cause.  It may have more to do with muscle stress.    Medications and allergies reviewed with patient and updated if appropriate.  Patient Active Problem List   Diagnosis Date Noted  . Hyperglycemia 08/21/2017  . Osteopenia 09/23/2016  . Need for prophylactic antibiotic 09/23/2016  . Tremor, benign 08/08/2016  . Hyperlipidemia 08/08/2016  . Insomnia 08/08/2016  . S/P mitral valve repair 03/18/2013  . S/P Maze operation for atrial fibrillation 03/18/2013  . Paroxysmal atrial fibrillation ([place]) 03/11/2013  . History of PSVT (paroxysmal supraventricular tachycardia) 03/03/2013    Class: Chronic    Current Outpatient Medications on File Prior to Visit  Medication Sig Dispense Refill  . aspirin 81 MG tablet Take 1 tablet (81 mg total) by mouth daily.    Marland Kitchen atorvastatin (LIPITOR) 20 MG tablet Take 1 tablet (20 mg total) by mouth daily. 90 tablet 3  . Calcium Carbonate-Vitamin D (CALCIUM + D PO) Take 2 capsules by mouth daily.      . metoprolol succinate (TOPROL-XL) 50 MG 24 hr tablet Take 1 tablet (50 mg total) by mouth daily. 90 tablet 3  . Omega-3 Fatty Acids (FISH OIL PO) Take 2 capsules by mouth daily.    . polyethylene glycol (MIRALAX / GLYCOLAX) packet Take 17 g by mouth daily.    Marland Kitchen spironolactone-hydrochlorothiazide (ALDACTAZIDE) 25-25 MG tablet Take 0.5 tablets by mouth daily. 45 tablet 2  . TURMERIC PO Take by mouth daily.    Marland Kitchen zolpidem (AMBIEN) 5 MG tablet Take 0.5 tablets (2.5 mg total) by mouth at bedtime as needed for sleep. 30 tablet 0   No current facility-administered medications on file prior to visit.     Past Medical History:  Diagnosis Date  . CHF (congestive heart failure) ([place])   . Heart murmur   . High cholesterol   . Hypertension   . Mitral regurgitation   . Mitral valve prolapse   . Paroxysmal atrial fibrillation ([place]) 03/11/2013  . Paroxysmal supraventricular tachycardia ([place])   . PONV (postoperative nausea and vomiting)    Dizziness and Nausea  . S/P Maze operation for atrial fibrillation 03/18/2013   Complete bilateral atrial lesion set using cryothermy and biopolar radiofrequency ablation with oversewing of LA appendage via right mini thoracotomy  . S/P mitral valve repair 03/18/2013   Complex valvuloplasty including triangular resection of posterior leaflet, artificial Gore-tex neocord placement x4 and 47mm Sorin Memo 3D ring annuloplasty via right mini thoracotomy  . Severe mitral  regurgitation 03/03/2013   Acute on chronic with acute heart failure   . Shortness of breath     Past Surgical History:  Procedure Laterality Date  . ABDOMINAL HYSTERECTOMY     hernia repair  . CARDIAC CATHETERIZATION    . INTRAOPERATIVE TRANSESOPHAGEAL ECHOCARDIOGRAM N/A 03/18/2013   Procedure: INTRAOPERATIVE TRANSESOPHAGEAL ECHOCARDIOGRAM;  Surgeon: Rexene Alberts, MD;  Location: [place];  Service: Open Heart Surgery;  Laterality: N/A;  . LEFT AND RIGHT HEART CATHETERIZATION WITH CORONARY ANGIOGRAM  N/A 03/06/2013   Procedure: LEFT AND RIGHT HEART CATHETERIZATION WITH CORONARY ANGIOGRAM;  Surgeon: Sinclair Grooms, MD;  Location: [facility] CATH LAB;  Service: Cardiovascular;  Laterality: N/A;  . MAZE N/A 03/18/2013   Procedure: MAZE;  Surgeon: Rexene Alberts, MD;  Location: [place];  Service: Open Heart Surgery;  Laterality: N/A;  . MITRAL VALVE REPAIR Right 03/18/2013   Procedure: MINIMALLY INVASIVE MITRAL VALVE REPAIR (MVR);  Surgeon: Rexene Alberts, MD;  Location: [place];  Service: Open Heart Surgery;  Laterality: Right;  . vericose veins    . WISDOM TOOTH EXTRACTION      Social History   Socioeconomic History  . Marital status: Married    Spouse name: Fritz Pickerel  . Number of children: 3  . Years of education: BS  . Highest education level: Not on file  Occupational History    Employer: [employer]  Social Needs  . Financial resource strain: Not on file  . Food insecurity:    Worry: Not on file    Inability: Not on file  . Transportation needs:    Medical: Not on file    Non-medical: Not on file  Tobacco Use  . Smoking status: Never Smoker  . Smokeless tobacco: Never Used  Substance and Sexual Activity  . Alcohol use: Yes    Alcohol/week: 3.0 - 3.6 oz    Types: 5 - 6 Glasses of wine per week    Comment: occasional  . Drug use: No  . Sexual activity: Yes  Lifestyle  . Physical activity:    Days per week: Not on file    Minutes per session: Not on file  . Stress: Not on file  Relationships  . Social connections:    Talks on phone: Not on file    Gets together: Not on file    Attends religious service: Not on file    Active member of club or organization: Not on file    Attends meetings of clubs or organizations: Not on file    Relationship status: Not on file  Other Topics Concern  . Not on file  Social History Narrative   Patient lives at home with her husband  Fritz Pickerel)  - second husband. Patient has college education B.S. Patient is a  Freight forwarder.  Clinical cytogeneticist for IT.   Right handed.   Caffeine- one cup daily.    Family History  Problem Relation Age of Onset  . Pancreatic cancer Mother   . Stomach cancer Father   . Hypertension Father   . Thyroid cancer Sister   . Colon cancer Paternal Grandmother   . Stomach cancer Paternal Grandfather   . Seizures Child   . Down syndrome Child     Review of Systems  Constitutional: Negative for chills and fever.  Respiratory: Negative for cough, shortness of breath and wheezing.   Cardiovascular: Positive for palpitations and leg swelling (mild at times). Negative for chest pain.  Endocrine: Positive for cold intolerance.  Neurological: Positive for tremors, numbness (occ hands go numb) and headaches (occasional). Negative for dizziness, weakness and light-headedness.       Objective:   Vitals:   02/19/18 1334 02/19/18 1356  BP: 134/88 132/80  Pulse: (!) 51   Resp: 16   Temp: 97.6 F (36.4 C)   SpO2: 98%    BP Readings from Last 3 Encounters:  02/19/18 132/80  10/05/17 118/80  08/21/17 128/82   Wt Readings from Last 3 Encounters:  02/19/18 171 lb (77.6 kg)  10/05/17 175 lb (79.4 kg)  08/21/17 175 lb (79.4 kg)   Body mass index is 26.78 kg/m.   Physical Exam    Constitutional: Appears well-developed and well-nourished. No distress.  HENT:  Head: Normocephalic and atraumatic.  Neck: Neck supple. No tracheal deviation present. No thyromegaly present.  No cervical lymphadenopathy Cardiovascular: Normal rate, regular rhythm and normal heart sounds.   No murmur heard. No carotid bruit .  No edema Pulmonary/Chest: Effort normal and breath sounds normal. No respiratory distress. No has no wheezes. No rales.  Skin: Skin is warm and dry. Not diaphoretic.  Psychiatric: Normal mood and affect. Behavior is normal.      Assessment & Plan:    See Problem List for Assessment and Plan of chronic medical problems.

## 2018-02-19 NOTE — Assessment & Plan Note (Signed)
Check lipid panel  Continue daily statin Regular exercise and healthy diet encouraged  

## 2018-02-19 NOTE — Assessment & Plan Note (Signed)
Taking ambien as needed only - not often - takes 2.5 mg prn Will continue

## 2018-02-19 NOTE — Progress Notes (Signed)
Ariel Simpson was seen today in the movement disorders clinic for neurologic consultation at the request of Burns, Claudina Lick, MD.  The consultation is for the evaluation of tremor.  The records that were made available to me were reviewed.  Pt previously saw Dr. Krista Blue but that was in 2014.  At that point she was dx with ET for 2 years duration.  She was also seen for transient diplopia  Tremor: Yes.     How long has it been going on? 3-4 years per patient but notes indicate longer  At rest or with activation?  Activation usually but when in yoga and in "easy" position will feel right leg becoming tremulous; notices it much worse after heavy lifting/heavy activity.  Seems to come and go  When is it noted the most?  Writing - "this is not impacting my quality of life"; read about differences between PD and ET and read about ET and told bilateral  Fam hx of tremor?  Yes, daughter and son (but she thinks that they got it from their dad)  Located where?  R arm and leg  Affected by caffeine:  "maybe" (1 large coffee/day)  Affected by alcohol:  No.  Affected by stress:  No.  Affected by fatigue:  Yes.    Spills soup if on spoon:  No.  Spills glass of liquid if full:  No.  Affects ADL's (tying shoes, brushing teeth, etc):  Yes.  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  Acting out dreams:  No. Wet Pillows: No. Postural symptoms:  Yes.   - does yoga and trouble balancing on right leg but walking is okay.  Does think that it is getting better with exercise.  "I do walk like a drunk person"  Falls?  Yes.  , tripped over dishwasher door but that was it Loss of smell:  No. - "I smell everything" Loss of taste:  No. Urinary Incontinence:  No. Difficulty Swallowing:  No. Depression:  No. Memory changes:  Some  trouble getting out facts/words but will then come to her N/V:  No. Lightheaded:  No.  Syncope: No. Diplopia:  Yes.  , but not since 2014 and saw neuro then Dyskinesia:  No.  Neuroimaging of the brain has previously been performed.  It is not available for my review today.  Report is available but not films.  2014 MRI reported to show small vessel disease.  PREVIOUS MEDICATIONS: none to date  ALLERGIES:  No Known Allergies  CURRENT MEDICATIONS:  Outpatient Encounter Medications as of 02/21/2018  Medication Sig  . aspirin 81 MG tablet Take 1 tablet (81 mg total) by mouth daily.  Marland Kitchen atorvastatin (LIPITOR) 20 MG tablet Take 1 tablet (20 mg total) by mouth daily.  . Calcium Carbonate-Vitamin D (CALCIUM + D PO) Take 2 capsules by mouth daily.  . metoprolol succinate (TOPROL-XL) 50 MG 24 hr tablet Take 1 tablet (50 mg total) by mouth daily.  . Omega-3 Fatty Acids (FISH OIL PO) Take 2 capsules by mouth daily.  . polyethylene glycol (MIRALAX / GLYCOLAX) packet Take 17 g by mouth daily.  Marland Kitchen spironolactone-hydrochlorothiazide (ALDACTAZIDE) 25-25 MG tablet Take 0.5 tablets by mouth daily.  . TURMERIC PO Take by mouth daily.  Marland Kitchen zolpidem (AMBIEN) 5 MG tablet Take 0.5 tablets (  2.5 mg total) by mouth at bedtime as needed for sleep.   No facility-administered encounter medications on file as of 02/21/2018.     PAST MEDICAL HISTORY:   Past Medical History:  Diagnosis Date  . CHF (congestive heart failure) ([place])   . Heart murmur   . High cholesterol   . Hypertension   . Mitral regurgitation   . Mitral valve prolapse   . Paroxysmal atrial fibrillation ([place]) 03/11/2013  . Paroxysmal supraventricular tachycardia ([place])   . PONV (postoperative nausea and vomiting)    Dizziness and Nausea  . S/P Maze operation for atrial fibrillation 03/18/2013   Complete bilateral atrial lesion set using cryothermy and biopolar radiofrequency ablation with oversewing of LA appendage via right mini thoracotomy  . S/P  mitral valve repair 03/18/2013   Complex valvuloplasty including triangular resection of posterior leaflet, artificial Gore-tex neocord placement x4 and 55mm Sorin Memo 3D ring annuloplasty via right mini thoracotomy  . Severe mitral regurgitation 03/03/2013   Acute on chronic with acute heart failure   . Shortness of breath     PAST SURGICAL HISTORY:   Past Surgical History:  Procedure Laterality Date  . ABDOMINAL HYSTERECTOMY     hernia repair  . CARDIAC CATHETERIZATION    . INTRAOPERATIVE TRANSESOPHAGEAL ECHOCARDIOGRAM N/A 03/18/2013   Procedure: INTRAOPERATIVE TRANSESOPHAGEAL ECHOCARDIOGRAM;  Surgeon: Rexene Alberts, MD;  Location: [place];  Service: Open Heart Surgery;  Laterality: N/A;  . LEFT AND RIGHT HEART CATHETERIZATION WITH CORONARY ANGIOGRAM N/A 03/06/2013   Procedure: LEFT AND RIGHT HEART CATHETERIZATION WITH CORONARY ANGIOGRAM;  Surgeon: Sinclair Grooms, MD;  Location: [facility] CATH LAB;  Service: Cardiovascular;  Laterality: N/A;  . MAZE N/A 03/18/2013   Procedure: MAZE;  Surgeon: Rexene Alberts, MD;  Location: [place];  Service: Open Heart Surgery;  Laterality: N/A;  . MITRAL VALVE REPAIR Right 03/18/2013   Procedure: MINIMALLY INVASIVE MITRAL VALVE REPAIR (MVR);  Surgeon: Rexene Alberts, MD;  Location: [place];  Service: Open Heart Surgery;  Laterality: Right;  . vericose veins    . WISDOM TOOTH EXTRACTION      SOCIAL HISTORY:   Social History   Socioeconomic History  . Marital status: Married    Spouse name: Fritz Pickerel  . Number of children: 3  . Years of education: BS  . Highest education level: Not on file  Occupational History    Employer: [employer]  Social Needs  . Financial resource strain: Not on file  . Food insecurity:    Worry: Not on file    Inability: Not on file  . Transportation needs:    Medical: Not on file    Non-medical: Not on file  Tobacco Use  . Smoking status: Never Smoker  . Smokeless tobacco: Never Used  Substance and Sexual  Activity  . Alcohol use: Yes    Alcohol/week: 3.0 - 3.6 oz    Types: 5 - 6 Glasses of wine per week    Comment: 1 drink/day  . Drug use: No  . Sexual activity: Yes  Lifestyle  . Physical activity:    Days per week: Not on file    Minutes per session: Not on file  . Stress: Not on file  Relationships  . Social connections:    Talks on phone: Not on file    Gets together: Not on file    Attends religious service: Not on file    Active member of club or organization: Not on file  Attends meetings of clubs or organizations: Not on file    Relationship status: Not on file  . Intimate partner violence:    Fear of current or ex partner: Not on file    Emotionally abused: Not on file    Physically abused: Not on file    Forced sexual activity: Not on file  Other Topics Concern  . Not on file  Social History Narrative   Patient lives at home with her husband  Fritz Pickerel)  - second husband. Patient has college education B.S. 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   Caffeine- one cup daily.    FAMILY HISTORY:   Family Status  Relation Name Status  . Mother  Deceased at age 46  . Father  Deceased at age 67  . Sister 2 Alive  . MGM  Deceased  . MGF  Deceased  . PGM  Deceased  . PGF  Deceased  . Child  Alive  . Child  Alive  . Child  Alive    ROS:  Some palpitations, chronic.  "i'm getting weaker but I can plank for 3 min."  A complete 10 system review of systems was obtained and was unremarkable apart from what is mentioned above.  PHYSICAL EXAMINATION:    VITALS:   Vitals:   02/21/18 0955  BP: 112/76  Pulse: 62  SpO2: 97%  Weight: 169 lb (76.7 kg)  Height: 5\' 7"  (1.702 m)    GEN:  The patient appears stated age and is in NAD. HEENT:  Normocephalic, atraumatic.  The mucous membranes are moist. The superficial temporal arteries are without ropiness or tenderness. CV:  RRR Lungs:  CTAB Neck/HEME:  There are no carotid bruits  bilaterally.  Neurological examination:  Orientation: The patient is alert and oriented x3. Fund of knowledge is appropriate.  Recent and remote memory are intact.  Attention and concentration are normal.    Able to name objects and repeat phrases. Cranial nerves: There is good facial symmetry. Pupils are equal round and reactive to light bilaterally. Fundoscopic exam reveals clear margins bilaterally. Extraocular muscles are intact. The visual fields are full to confrontational testing. The speech is fluent and clear. Soft palate rises symmetrically and there is no tongue deviation. Hearing is intact to conversational tone. Sensation: Sensation is intact to light and pinprick throughout (facial, trunk, extremities). Vibration is intact at the bilateral big toe. There is no extinction with double simultaneous stimulation. There is no sensory dermatomal level identified. Motor: Strength is 5/5 in the bilateral upper and lower extremities.   Shoulder shrug is equal and symmetric.  There is no pronator drift. Deep tendon reflexes: Deep tendon reflexes are 2-/4 at the bilateral biceps, triceps, brachioradialis, patella and achilles. Plantar responses are downgoing bilaterally.  Movement examination: Tone: There is normal tone in the bilateral upper extremities.  The tone in the lower extremities is normal.  Abnormal movements: There is mild tremor of both outstretched arms, R more than L.  With distraction a rest tremor can be felt and slightly seen in the R thumb.   Coordination:  There is no decremation with RAM's, with any form of RAMS, including alternating supination and pronation of the forearm, hand opening and closing, finger taps, heel taps and toe taps Gait and Station: The patient has no difficulty arising out of a deep-seated chair without the use of the hands. The patient's stride length is normal.  The patient has a negative pull  test.      Labs:  Lab Results  Component Value Date   TSH  2.32 02/19/2018     Chemistry      Component Value Date/Time   NA 139 02/19/2018 1403   NA 144 04/02/2017 0850   K 4.6 02/19/2018 1403   CL 102 02/19/2018 1403   CO2 30 02/19/2018 1403   BUN 16 02/19/2018 1403   BUN 18 04/02/2017 0850   CREATININE 0.87 02/19/2018 1403   CREATININE 0.96 10/10/2013 1606      Component Value Date/Time   CALCIUM 10.2 02/19/2018 1403   ALKPHOS 61 02/19/2018 1403   AST 21 02/19/2018 1403   ALT 21 02/19/2018 1403   BILITOT 0.6 02/19/2018 1403   BILITOT 0.5 04/02/2017 0850       ASSESSMENT/PLAN:  1.  Tremor  -is likely asymmetric essential tremor, which can happen in 30% of cases.  However, given occasional rest tremor I do think that we should monitor her over time.  I reassured her today that I saw no evidence of PD (no bradykinesia, no rigidity).  She isn't interested in medication and I agree with her.  I will plan on seeing her back in a year, sooner should new neuro issues arise.   Cc:  Binnie Rail, MD

## 2018-02-19 NOTE — Patient Instructions (Addendum)
  Test(s) ordered today. Your results will be released to [facility] (or called to you) after review, usually within 72hours after test completion. If any changes need to be made, you will be notified at that same time.  Medications reviewed and updated.  No changes recommended at this time.  Your prescription(s) have been submitted to your pharmacy. Please take as directed and contact our office if you believe you are having problem(s) with the medication(s).  A referral was ordered for neurology.  Please followup in 6 months

## 2018-02-19 NOTE — Assessment & Plan Note (Signed)
Tremor has been bothering her more Right hand and right leg involved Will refer to neurology

## 2018-02-19 NOTE — Assessment & Plan Note (Signed)
a1c Working on weight loss exercising

## 2018-02-20 LAB — VITAMIN D
25-OH VIT D2: <4 ng/mL
25-OH VIT D3: 53 ng/mL
25-OH Vit Total: 53 ng/mL (ref 30–60)

## 2018-02-21 ENCOUNTER — Ambulatory Visit: Payer: BLUE CROSS/BLUE SHIELD | Admitting: Neurology

## 2018-02-21 ENCOUNTER — Encounter: Payer: Self-pay | Admitting: Neurology

## 2018-02-21 ENCOUNTER — Encounter: Payer: Self-pay | Admitting: Internal Medicine

## 2018-02-21 VITALS — BP 112/76 | HR 62 | Ht 67.0 in | Wt 169.0 lb

## 2018-02-21 DIAGNOSIS — G250 Essential tremor: Secondary | ICD-10-CM

## 2018-02-21 LAB — CARBAMAZEPINE, FREE & TOTAL
Carbamazepine, Free: 22.6 % (ref 8.0–35.0)
Carbamazepine,Free: 2.4 ug/mL (ref 1.0–3.0)
Carbamazepine,Total: 10.6 ug/mL (ref 4.0–12.0)

## 2018-02-21 LAB — VITAMIN K: Vitamin K: 4.61 nmol/L (ref 0.22–4.88)

## 2018-02-25 ENCOUNTER — Encounter: Payer: Self-pay | Admitting: Gastroenterology

## 2018-02-26 ENCOUNTER — Telehealth: Payer: Self-pay | Admitting: Neurology

## 2018-02-26 NOTE — Telephone Encounter (Signed)
Received progress note from [facility]. Placed in scanning.

## 2018-03-04 ENCOUNTER — Encounter: Payer: Self-pay | Admitting: Gastroenterology

## 2018-03-14 ENCOUNTER — Encounter: Payer: Self-pay | Admitting: Interventional Cardiology

## 2018-03-19 ENCOUNTER — Ambulatory Visit: Payer: PRIVATE HEALTH INSURANCE | Attending: Neurology | Admitting: Neurology

## 2018-03-19 ENCOUNTER — Encounter: Payer: Self-pay | Admitting: Neurology

## 2018-03-19 VITALS — BP 124/80 | HR 88 | Temp 98.6°F | Wt 115.0 lb

## 2018-03-19 DIAGNOSIS — R059 Cough, unspecified: Secondary | ICD-10-CM

## 2018-03-19 DIAGNOSIS — R05 Cough: Secondary | ICD-10-CM

## 2018-03-19 DIAGNOSIS — J45909 Unspecified asthma, uncomplicated: Secondary | ICD-10-CM

## 2018-03-19 DIAGNOSIS — J019 Acute sinusitis, unspecified: Secondary | ICD-10-CM

## 2018-03-19 MED ORDER — FLUTICASONE-SALMETEROL 250-50 MCG/ACT IN AEPB *I*
1.0000 | INHALATION_SPRAY | Freq: Two times a day (BID) | RESPIRATORY_TRACT | 2 refills | Status: DC
Start: 2018-03-19 — End: 2018-04-19

## 2018-03-19 MED ORDER — DOXYCYCLINE HYCLATE 100 MG PO CAPS *I*
100.0000 mg | ORAL_CAPSULE | Freq: Two times a day (BID) | ORAL | 0 refills | Status: DC
Start: 2018-03-19 — End: 2018-03-27

## 2018-03-22 NOTE — NoShare Progress Note (Signed)
SUBJECTIVE:  Developed sinus congestion, postnasal drainage, bilateral ear fullness, "heavy eyes" 9 days ago.  Nonproductive cough.  No shortness of breath.  No wheeze.  Fatigue.  Chills.  No fever.  Has taken isolated doses of Sudafed.  Has been using sinus rinse, fluticasone nasal spray, Astelin nasal spray and taking loratadine daily.    RAD.  Denies known environmental irritant or allergen.    Uses Advair 250/50 prn.    Husband has been ill with URI/sinusitis.     Medication regime reconciled.  Current Outpatient Prescriptions   Medication    RABEprazole (ACIPHEX) 20 MG tablet    RABEprazole (ACIPHEX) 20 MG tablet    azelastine (ASTELIN) 0.1 % nasal spray    generic DME    amLODIPine (NORVASC) 2.5 MG tablet    atorvastatin (LIPITOR) 10 MG tablet    levothyroxine (SYNTHROID, LEVOTHROID) 25 MCG tablet    carBAMazepine (CARBATROL) 100 MG 12 hr capsule    triamcinolone (KENALOG) 0.1 % ointment    carBAMazepine (TEGRETOL XR) 200 MG 12 hr tablet    darifenacin (ENABLEX) 15 MG 24 hr tablet    mupirocin (BACTROBAN) 2 % ointment    Non-System Medication    ranitidine (ZANTAC) 150 MG tablet    fluticasone (FLONASE) 50 MCG/ACT nasal spray    Calcium Carb-Cholecalciferol 600-200 MG-UNIT TABS    Denosumab (PROLIA SC)    loratadine (CLARITIN) 10 MG tablet    Multiple Vitamin (MULTIVITAMIN) per tablet    cholecalciferol (VITAMIN D) 1000 UNITS capsule    doxycycline hyclate (VIBRAMYCIN) 100 MG capsule    fluticasone-salmeterol (ADVAIR/WIXELA) 250-50 MCG/DOSE diskus inhaler     No current facility-administered medications for this visit.        OBJECTIVE:  Blood pressure 124/80, pulse 88, temperature 37 C (98.6 F), temperature source Temporal, weight 52.2 kg (115 lb).  Dr. Edwina Barth appears comfortable.  Sounds congested.  No pallor.  TMs pearly gray.  Bilateral middle ear effusions.  Frontal, periorbital and maxillary sinus tenderness.  Transillumination diminished bilaterally.  Posterior oropharynx  slightly erythematous.  No exudate.  Neck supple.  Lungs fields clear.  Normal excursion.  No rhonchi, crackles, wheeze.  RRR.    IMPRESSION/PLAN:    1.  Acute sinusitis.  Doxycycline 10 days.  Continue expectorant, isolated doses of decongestant, fluticasone nasal spray, Astelin nasal spray, loratadine, humidified air, fluids.    2.  RAD - no evidence of acute exacerbation on exam.  Resume Advair 250/50 twice daily - may reduce intensity of cough.  Avoid known environmental irritants and allergens.    Further intervention pending symptom response.

## 2018-03-24 ENCOUNTER — Other Ambulatory Visit: Payer: Self-pay | Admitting: Interventional Cardiology

## 2018-03-24 DIAGNOSIS — I10 Essential (primary) hypertension: Secondary | ICD-10-CM

## 2018-03-26 ENCOUNTER — Ambulatory Visit: Payer: PRIVATE HEALTH INSURANCE | Attending: Neurology | Admitting: Neurology

## 2018-03-26 ENCOUNTER — Encounter: Payer: Self-pay | Admitting: Neurology

## 2018-03-26 VITALS — BP 106/80 | HR 72 | Temp 97.9°F | Wt 115.0 lb

## 2018-03-26 DIAGNOSIS — J019 Acute sinusitis, unspecified: Secondary | ICD-10-CM

## 2018-03-26 DIAGNOSIS — H5713 Ocular pain, bilateral: Secondary | ICD-10-CM

## 2018-03-26 MED ORDER — PREDNISONE 10 MG PO TABS *I*
ORAL_TABLET | ORAL | 0 refills | Status: DC
Start: 2018-03-26 — End: 2018-04-19

## 2018-03-27 ENCOUNTER — Telehealth: Payer: Self-pay | Admitting: Primary Care

## 2018-03-27 MED ORDER — DOXYCYCLINE HYCLATE 100 MG PO CAPS *I*
100.0000 mg | ORAL_CAPSULE | Freq: Two times a day (BID) | ORAL | 0 refills | Status: DC
Start: 2018-03-27 — End: 2018-04-19

## 2018-03-27 NOTE — Telephone Encounter (Signed)
I wrote for another ten day course.

## 2018-03-27 NOTE — Telephone Encounter (Signed)
Pt seen [facility] yesterday for sinusitis. Was prescribed doxy on 4/30 for 10 days. She was prescribed prednisone yesterday. She said she forgot to ask [facility] yesterday if she would extend the duration of the doxy. She said she still feels really congested and has throat discomfort.

## 2018-03-27 NOTE — Telephone Encounter (Signed)
Pt aware.

## 2018-03-31 NOTE — Progress Notes (Signed)
Cardiology Office Note    Date:  04/01/2018   ID:  Ariel Simpson, DOB [birth year], MRN [id number]  PCP:  Binnie Rail, MD  Cardiologist: Sinclair Grooms, MD   Chief Complaint  Patient presents with  . Cardiac Valve Problem  . Atrial Fibrillation    History of Present Illness:  Ariel Simpson is a 62 y.o. female who presents for Mitral valve prolapse, significant mitral regurgitation resulting in mitral valve repair in 2013, paroxysmal atrial fibrillation preceding surgery, operative Maze, and remote history of PSVT antedating mitral valve regurgitation by 20 years.   Ariel Simpson has no complaints.  She has not had atrial fibrillation, sustained palpitation, lower extremity swelling, chest pain, orthopnea, PND, or edema.  No medication side effects.  She continues to exercise regularly.  She inquires about whether to do an echocardiogram.  Surgery was 2013.   Past Medical History:  Diagnosis Date  . CHF (congestive heart failure) ([place])   . Heart murmur   . High cholesterol   . Hypertension   . Mitral regurgitation   . Mitral valve prolapse   . Paroxysmal atrial fibrillation ([place]) 03/11/2013  . Paroxysmal supraventricular tachycardia ([place])   . PONV (postoperative nausea and vomiting)    Dizziness and Nausea  . S/P Maze operation for atrial fibrillation 03/18/2013   Complete bilateral atrial lesion set using cryothermy and biopolar radiofrequency ablation with oversewing of LA appendage via right mini thoracotomy  . S/P mitral valve repair 03/18/2013   Complex valvuloplasty including triangular resection of posterior leaflet, artificial Gore-tex neocord placement x4 and 91mm Sorin Memo 3D ring annuloplasty via right mini thoracotomy  . Severe mitral regurgitation 03/03/2013   Acute on chronic with acute heart failure   . Shortness of breath     Past Surgical History:  Procedure Laterality Date  . ABDOMINAL HYSTERECTOMY     hernia repair  . CARDIAC CATHETERIZATION    .  INTRAOPERATIVE TRANSESOPHAGEAL ECHOCARDIOGRAM N/A 03/18/2013   Procedure: INTRAOPERATIVE TRANSESOPHAGEAL ECHOCARDIOGRAM;  Surgeon: Rexene Alberts, MD;  Location: [facility];  Service: Open Heart Surgery;  Laterality: N/A;  . LEFT AND RIGHT HEART CATHETERIZATION WITH CORONARY ANGIOGRAM N/A 03/06/2013   Procedure: LEFT AND RIGHT HEART CATHETERIZATION WITH CORONARY ANGIOGRAM;  Surgeon: Sinclair Grooms, MD;  Location: [facility] CATH LAB;  Service: Cardiovascular;  Laterality: N/A;  . MAZE N/A 03/18/2013   Procedure: MAZE;  Surgeon: Rexene Alberts, MD;  Location: [place];  Service: Open Heart Surgery;  Laterality: N/A;  . MITRAL VALVE REPAIR Right 03/18/2013   Procedure: MINIMALLY INVASIVE MITRAL VALVE REPAIR (MVR);  Surgeon: Rexene Alberts, MD;  Location: [place];  Service: Open Heart Surgery;  Laterality: Right;  . vericose veins    . WISDOM TOOTH EXTRACTION      Current Medications: Outpatient Medications Prior to Visit  Medication Sig Dispense Refill  . aspirin 81 MG tablet Take 1 tablet (81 mg total) by mouth daily.    Marland Kitchen atorvastatin (LIPITOR) 20 MG tablet Take 1 tablet (20 mg total) by mouth daily. 90 tablet 3  . Calcium Carbonate-Vitamin D (CALCIUM + D PO) Take 2 capsules by mouth daily.    . metoprolol succinate (TOPROL-XL) 50 MG 24 hr tablet Take 1 tablet (50 mg total) by mouth daily. 90 tablet 3  . Omega-3 Fatty Acids (FISH OIL PO) Take 2 capsules by mouth daily.    . polyethylene glycol (MIRALAX / GLYCOLAX) packet Take 17 g by mouth daily.    Marland Kitchen  spironolactone-hydrochlorothiazide (ALDACTAZIDE) 25-25 MG tablet TAKE ONE-HALF TABLET BY MOUTH DAILY 45 tablet 0  . TURMERIC PO Take 1 tablet by mouth daily.     Marland Kitchen zolpidem (AMBIEN) 5 MG tablet Take 0.5 tablets (2.5 mg total) by mouth at bedtime as needed for sleep. 30 tablet 0   No facility-administered medications prior to visit.      Allergies:   Patient has no known allergies.   Social History   Socioeconomic History  . Marital status: Married     Spouse name: Ariel Simpson  . Number of children: 3  . Years of education: BS  . Highest education level: Not on file  Occupational History    Employer: [employer]  Social Needs  . Financial resource strain: Not on file  . Food insecurity:    Worry: Not on file    Inability: Not on file  . Transportation needs:    Medical: Not on file    Non-medical: Not on file  Tobacco Use  . Smoking status: Never Smoker  . Smokeless tobacco: Never Used  Substance and Sexual Activity  . Alcohol use: Yes    Alcohol/week: 3.0 - 3.6 oz    Types: 5 - 6 Glasses of wine per week    Comment: 1 drink/day  . Drug use: No  . Sexual activity: Yes  Lifestyle  . Physical activity:    Days per week: Not on file    Minutes per session: Not on file  . Stress: Not on file  Relationships  . Social connections:    Talks on phone: Not on file    Gets together: Not on file    Attends religious service: Not on file    Active member of club or organization: Not on file    Attends meetings of clubs or organizations: Not on file    Relationship status: Not on file  Other Topics Concern  . Not on file  Social History Narrative   Patient lives at home with her husband  Ariel Simpson)  - second husband. Patient has college education B.S. 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   Caffeine- one cup daily.     Family History:  The patient's family history includes Colon cancer in her paternal grandmother; Down syndrome in her child; Hypertension in her father; Pancreatic cancer in her mother; Seizures in her child; Stomach cancer in her father and paternal grandfather; Thyroid cancer in her sister.   ROS:   Please see the history of present illness.    Left hip and right ankle discomfort occasionally limit aerobic activity.  Rare isolated palpitation lasting seconds. All other systems reviewed and are negative.   PHYSICAL EXAM:   VS:  BP 126/84   Pulse (!) 53   Ht 5\' 7"  (1.702 m)    Wt 168 lb 9.6 oz (76.5 kg)   BMI 26.41 kg/m    GEN: Well nourished, well developed, in no acute distress  HEENT: normal  Neck: no JVD, carotid bruits, or masses Cardiac: RRR; 1/6 systolic murmur, with no rubs, or gallops,no edema.  Respiratory:  clear to auscultation bilaterally, normal work of breathing GI: soft, nontender, nondistended, + BS MS: no deformity or atrophy  Skin: warm and dry, no rash Neuro:  Alert and Oriented x 3, Strength and sensation are intact Psych: euthymic mood, full affect  Wt Readings from Last 3 Encounters:  04/01/18 168 lb 9.6 oz (76.5 kg)  02/21/18 169 lb (  76.7 kg)  02/19/18 171 lb (77.6 kg)      Studies/Labs Reviewed:   EKG:  EKG sinus bradycardia and otherwise unremarkable.  Recent Labs: 02/19/2018: ALT 21; BUN 16; Creatinine, Ser 0.87; Hemoglobin 13.8; Platelets 216.0; Potassium 4.6; Sodium 139; TSH 2.32   Lipid Panel    Component Value Date/Time   CHOL 162 02/19/2018 1403   CHOL 156 04/02/2017 0850   TRIG 190.0 (H) 02/19/2018 1403   HDL 54.70 02/19/2018 1403   HDL 56 04/02/2017 0850   CHOLHDL 3 02/19/2018 1403   VLDL 38.0 02/19/2018 1403   LDLCALC 70 02/19/2018 1403   LDLCALC 86 04/02/2017 0850    Additional studies/ records that were reviewed today include:  No recent cardiac imaging.    ASSESSMENT:    1. S/P mitral valve repair   2. S/P Maze operation for atrial fibrillation   3. Other hyperlipidemia   4. Essential hypertension      PLAN:  In order of problems listed above:  1. Faint 1/6 systolic murmur heard at the apex.  No clinical evidence of volume overload.  2D Doppler echocardiogram to follow-up on mitral valve repair performed in 2013. 2. No recurrence of sustained atrial fibrillation. 3. LDL cholesterol less than 70 is a target and is being achieved with statin therapy. 4. Excellent BP control.  Target 130/80 mmHg.  Clinical follow-up in 1 year.  Encouraged to continue aerobic activity.  LDL cholesterol less  than 70, target blood pressure 130/80 mmHg, 2D Doppler echocardiogram will be done to follow-up on structural integrity of the heart.    Medication Adjustments/Labs and Tests Ordered: Current medicines are reviewed at length with the patient today.  Concerns regarding medicines are outlined above.  Medication changes, Labs and Tests ordered today are listed in the Patient Instructions below. There are no Patient Instructions on file for this visit.   Signed, Sinclair Grooms, MD  04/01/2018 8:26 AM    [address], [place], [place]  [address] Phone: [phone]; Fax: [phone]

## 2018-04-01 ENCOUNTER — Encounter: Payer: Self-pay | Admitting: Interventional Cardiology

## 2018-04-01 ENCOUNTER — Ambulatory Visit: Payer: BLUE CROSS/BLUE SHIELD | Admitting: Interventional Cardiology

## 2018-04-01 VITALS — BP 126/84 | HR 53 | Ht 67.0 in | Wt 168.6 lb

## 2018-04-01 DIAGNOSIS — E7849 Other hyperlipidemia: Secondary | ICD-10-CM

## 2018-04-01 DIAGNOSIS — Z9889 Other specified postprocedural states: Secondary | ICD-10-CM

## 2018-04-01 DIAGNOSIS — Z8679 Personal history of other diseases of the circulatory system: Secondary | ICD-10-CM | POA: Diagnosis not present

## 2018-04-01 DIAGNOSIS — I10 Essential (primary) hypertension: Secondary | ICD-10-CM | POA: Diagnosis not present

## 2018-04-01 NOTE — Patient Instructions (Signed)
Medication Instructions:  Your physician recommends that you continue on your current medications as directed. Please refer to the Current Medication list given to you today.  Labwork: None  Testing/Procedures: Your physician has requested that you have an echocardiogram between now and follow up next year. Echocardiography is a painless test that uses sound waves to create images of your heart. It provides your doctor with information about the size and shape of your heart and how well your heart's chambers and valves are working. This procedure takes approximately one hour. There are no restrictions for this procedure.    Follow-Up: Your physician wants you to follow-up in: 1 year with Dr. Tamala Julian.  You will receive a reminder letter in the mail two months in advance. If you don't receive a letter, please call our office to schedule the follow-up appointment.   Any Other Special Instructions Will Be Listed Below (If Applicable).     If you need a refill on your cardiac medications before your next appointment, please call your pharmacy.

## 2018-04-02 ENCOUNTER — Other Ambulatory Visit: Payer: Self-pay

## 2018-04-02 ENCOUNTER — Ambulatory Visit (HOSPITAL_COMMUNITY): Payer: BLUE CROSS/BLUE SHIELD | Attending: Internal Medicine

## 2018-04-02 DIAGNOSIS — Z9889 Other specified postprocedural states: Secondary | ICD-10-CM

## 2018-04-02 DIAGNOSIS — I071 Rheumatic tricuspid insufficiency: Secondary | ICD-10-CM | POA: Diagnosis not present

## 2018-04-02 DIAGNOSIS — I700 Atherosclerosis of aorta: Secondary | ICD-10-CM | POA: Insufficient documentation

## 2018-04-02 DIAGNOSIS — E785 Hyperlipidemia, unspecified: Secondary | ICD-10-CM | POA: Diagnosis not present

## 2018-04-02 DIAGNOSIS — I272 Pulmonary hypertension, unspecified: Secondary | ICD-10-CM | POA: Diagnosis not present

## 2018-04-02 DIAGNOSIS — I119 Hypertensive heart disease without heart failure: Secondary | ICD-10-CM | POA: Insufficient documentation

## 2018-04-04 NOTE — NoShare Progress Note (Signed)
SUBJECTIVE:  Was seen 03/19/18-diagnosed with acute sinusitis.  Is completing course of doxycycline.  Is continuing expectorant, Sudafed when necessary, fluticasone nasal spray, Astelin nasal spray, loratadine and humidified air.  Endorses symptom improvement.  Has noted a decrease in sinus congestion and postnasal drainage.  Is continuing to note periorbital heaviness. No vision change, photophobia, phonophobia, N/V, vertigo.  No temporal tenderness. Improves with Sudafed.      Is requesting short oral prednisone taper to reduce sinus mucosal inflammation.     Medication regime reconciled.    Current Outpatient Prescriptions   Medication    doxycycline hyclate (VIBRAMYCIN) 100 MG capsule    predniSONE (DELTASONE) 10 MG tablet    fluticasone-salmeterol (ADVAIR/WIXELA) 250-50 MCG/DOSE diskus inhaler    RABEprazole (ACIPHEX) 20 MG tablet    RABEprazole (ACIPHEX) 20 MG tablet    azelastine (ASTELIN) 0.1 % nasal spray    generic DME    amLODIPine (NORVASC) 2.5 MG tablet    atorvastatin (LIPITOR) 10 MG tablet    levothyroxine (SYNTHROID, LEVOTHROID) 25 MCG tablet    carBAMazepine (CARBATROL) 100 MG 12 hr capsule    triamcinolone (KENALOG) 0.1 % ointment    carBAMazepine (TEGRETOL XR) 200 MG 12 hr tablet    darifenacin (ENABLEX) 15 MG 24 hr tablet    mupirocin (BACTROBAN) 2 % ointment    Non-System Medication    ranitidine (ZANTAC) 150 MG tablet    fluticasone (FLONASE) 50 MCG/ACT nasal spray    Calcium Carb-Cholecalciferol 600-200 MG-UNIT TABS    Denosumab (PROLIA SC)    loratadine (CLARITIN) 10 MG tablet    Multiple Vitamin (MULTIVITAMIN) per tablet    cholecalciferol (VITAMIN D) 1000 UNITS capsule     No current facility-administered medications for this visit.        OBJECTIVE:  Blood pressure 106/80, pulse 72, temperature 36.6 C (97.9 F), temperature source Temporal, weight 52.2 kg (115 lb), SpO2 100 %.  Appears comfortable.  TMs pearly gray.  Serous effusions bilaterally.  Frontal and  maxillary sinus tenderness, but decrease in tenderness since last examination.  No temporal tenderness.  Posterior oropharynx noninjected.  Neck supple.  Lungs fields clear.    IMPRESSION/PLAN:    Acute sinusitis.  Will complete course of doxycycline as prescribed.  Prednisone 20 mg daily 5 days, 10 mg 5 days.  Take with food.  Continue expectorant, decongestant, steroid & saline nasal spray, humidified air and fluids.  Consider CT sinus imaging pending symptom response.

## 2018-04-19 ENCOUNTER — Ambulatory Visit: Payer: PRIVATE HEALTH INSURANCE | Attending: Primary Care | Admitting: Primary Care

## 2018-04-19 ENCOUNTER — Encounter: Payer: Self-pay | Admitting: Primary Care

## 2018-04-19 VITALS — BP 108/78 | HR 68 | Ht 63.0 in | Wt 114.8 lb

## 2018-04-19 DIAGNOSIS — M810 Age-related osteoporosis without current pathological fracture: Secondary | ICD-10-CM

## 2018-04-19 DIAGNOSIS — Z23 Encounter for immunization: Secondary | ICD-10-CM

## 2018-04-19 DIAGNOSIS — I10 Essential (primary) hypertension: Secondary | ICD-10-CM

## 2018-04-19 DIAGNOSIS — E785 Hyperlipidemia, unspecified: Secondary | ICD-10-CM

## 2018-04-19 DIAGNOSIS — E561 Deficiency of vitamin K: Secondary | ICD-10-CM

## 2018-04-19 DIAGNOSIS — E034 Atrophy of thyroid (acquired): Secondary | ICD-10-CM

## 2018-04-19 NOTE — Patient Instructions (Signed)
Would call [facility] regarding the mammogram    We will put you on the list for the shingrix

## 2018-04-19 NOTE — NoShare Progress Note (Signed)
Reason for Visit: Follow-up    She is finally getting over the prolonged sinus infection.  She did stop the antibiotic for a few days because of the GI effects   She did take the prednisone that was prescribed by Juliann Pulse.    Bladder control is not great, but unchanged.    Cut off for PT by insurance  Her quality of life is very limited  We may send her for her back to help with strength and balance.    Her last set of labs was completed in April.  Hyperlipidemia-remains on 8 atorvastatin, low dose.  Total cholesterol 208, triglycerides 89, HDL 103 and LDL 87  Denies myalgia or function tests normal.    Hypothyroidism-remains on levothyroxine, TSH is 1.97    Patient Active Problem List   Diagnosis Code    Reported Trauma Neck     Allergic rhinitis J30.9    Irritable bowel syndrome K58.9    Essential hypertension I10    Dysplastic Nevus I78.1    Rosacea L71.9    Osteoporosis M81.0    Hypothyroidism E03.9    Scoliosis M41.20    Urge Incontinence Of Urine N39.41    A-V Malformation Repair Supratentorial Complex     Seizure disorder G40.909    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    Ringing In The Ears (Tinnitus) H93.19    GERD (gastroesophageal reflux disease) K21.9    Pain in joint, ankle and foot M25.579    Weakness of foot M21.40    Hyperlipidemia E78.5     Current Outpatient Prescriptions   Medication    RABEprazole (ACIPHEX) 20 MG tablet    azelastine (ASTELIN) 0.1 % nasal spray    generic DME    amLODIPine (NORVASC) 2.5 MG tablet    atorvastatin (LIPITOR) 10 MG tablet    levothyroxine (SYNTHROID, LEVOTHROID) 25 MCG tablet    carBAMazepine (CARBATROL) 100 MG 12 hr capsule    triamcinolone (KENALOG) 0.1 % ointment    carBAMazepine (TEGRETOL XR) 200 MG 12 hr tablet    darifenacin (ENABLEX) 15 MG 24 hr tablet    Non-System Medication    ranitidine (ZANTAC) 150 MG tablet    fluticasone (FLONASE) 50 MCG/ACT nasal spray    Calcium Carb-Cholecalciferol 600-200 MG-UNIT TABS     Denosumab (PROLIA SC)    loratadine (CLARITIN) 10 MG tablet    Multiple Vitamin (MULTIVITAMIN) per tablet    cholecalciferol (VITAMIN D) 1000 UNITS capsule     No current facility-administered medications for this visit.        Medication list reviewed and updated, no changes were made today    Exam: BP 108/78    Pulse 68    Ht 1.6 m (5\' 3" )    Wt 52.1 kg (114 lb 12.8 oz)    BMI 20.34 kg/m   Frail 62 year old lady in no acute distress.  Still show signs of some mucosal irritation and slight exudate.  Oral mucosa is moist without exudate.  Neck is supple without lymphadenopathy.  Lungs are clear to auscultation bilaterally.  Cardiac exam reveals a normal S1-S2 with a regular rate and rhythm.  Per patient request, breast exam was completed and shows no evidence of masses or axillary adenopathy.  No nipple inversion or skin changes.  Abdomen is soft and nontender.  These are without edema    A/P:  1.  Hyperlipidemia--well controlled on Atorvastatin, no changes were made today.  2.  Hypothyroidism-well controlled on levothyroxine.  3.  Hypertension-blood pressure  well controlled on amlodipine.  No changes were made today.  Meets blood pressure goals.  4.  Osteoporosis-the plan has been to continue prolia until her bone density scan next year.  While we don't have any firm recommendations, she does worry about the long-term side effects and would consider a one-year bone holiday she asks about seeing a doctor other than her gynecologist for this.  This certainly is an option but I reassured her that I really do not think that any other physician is going to give her a significantly different opinion  Patient agrees to schedule her mammogram  Td was due and administered today

## 2018-04-23 ENCOUNTER — Other Ambulatory Visit: Payer: Self-pay | Admitting: Interventional Cardiology

## 2018-04-27 ENCOUNTER — Other Ambulatory Visit: Payer: Self-pay | Admitting: Internal Medicine

## 2018-04-28 ENCOUNTER — Encounter: Payer: Self-pay | Admitting: Primary Care

## 2018-04-29 NOTE — Telephone Encounter (Signed)
[place] Controlled Substance Database checked. Last filled on 01/22/18

## 2018-05-09 ENCOUNTER — Other Ambulatory Visit: Payer: Self-pay | Admitting: Neurology

## 2018-05-13 ENCOUNTER — Other Ambulatory Visit: Payer: Self-pay | Admitting: Gastroenterology

## 2018-05-29 ENCOUNTER — Encounter: Payer: Self-pay | Admitting: Primary Care

## 2018-05-29 ENCOUNTER — Ambulatory Visit: Payer: PRIVATE HEALTH INSURANCE | Attending: Primary Care | Admitting: Primary Care

## 2018-05-29 VITALS — BP 118/76 | HR 66 | Temp 98.5°F | Ht 62.99 in | Wt 115.4 lb

## 2018-05-29 DIAGNOSIS — K1370 Unspecified lesions of oral mucosa: Secondary | ICD-10-CM

## 2018-05-29 DIAGNOSIS — J0111 Acute recurrent frontal sinusitis: Secondary | ICD-10-CM

## 2018-05-29 MED ORDER — DOXYCYCLINE HYCLATE 100 MG PO CAPS *I*
100.0000 mg | ORAL_CAPSULE | Freq: Two times a day (BID) | ORAL | 0 refills | Status: DC
Start: 2018-05-29 — End: 2018-07-17

## 2018-05-29 NOTE — NoShare Progress Note (Signed)
Reason for Visit: Sinusitis    Has needed to take sudafed every day since April  Feels dull fullness in the frontal and temporal region.sinuses are very congested  Still taking all her other meds as directed and doing sinus rinses    Also comments on recent development of an oral lesion.  She thought she scratched her burned the roof of her mouth  Did a salt water gargle this morning    What is not going well for her is that PT is denied by [facility]  She is having to pay out of pocket for PT and her physical trainer  She feels that she has setbacks in terms of strength and mobility without it.     She has a female first cousin with breast cancer, tested negative genetically  female second cousin  28 yo first cousin with breast cancer  Will do the genetic testing with her cousin in [place] mole check on Monday  Dr. Vella Raring appt is in two weeks    Son at [facility] for the SUmmer  Patient Active Problem List   Diagnosis Code    Reported Trauma Neck     Allergic rhinitis J30.9    Irritable bowel syndrome K58.9    Essential hypertension I10    Dysplastic Nevus I78.1    Rosacea L71.9    Osteoporosis M81.0    Hypothyroidism E03.9    Scoliosis M41.20    Urge Incontinence Of Urine N39.41    A-V Malformation Repair Supratentorial Complex     Seizure disorder G40.909    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    Ringing In The Ears (Tinnitus) H93.19    GERD (gastroesophageal reflux disease) K21.9    Pain in joint, ankle and foot M25.579    Weakness of foot M21.40    Hyperlipidemia E78.5     Current Outpatient Prescriptions   Medication    RABEprazole (ACIPHEX) 20 MG tablet    azelastine (ASTELIN) 0.1 % nasal spray    generic DME    amLODIPine (NORVASC) 2.5 MG tablet    atorvastatin (LIPITOR) 10 MG tablet    levothyroxine (SYNTHROID, LEVOTHROID) 25 MCG tablet    carBAMazepine (CARBATROL) 100 MG 12 hr capsule    triamcinolone (KENALOG) 0.1 % ointment    carBAMazepine (TEGRETOL XR) 200 MG 12  hr tablet    darifenacin (ENABLEX) 15 MG 24 hr tablet    Non-System Medication    ranitidine (ZANTAC) 150 MG tablet    fluticasone (FLONASE) 50 MCG/ACT nasal spray    Calcium Carb-Cholecalciferol 600-200 MG-UNIT TABS    Denosumab (PROLIA SC)    loratadine (CLARITIN) 10 MG tablet    Multiple Vitamin (MULTIVITAMIN) per tablet    cholecalciferol (VITAMIN D) 1000 UNITS capsule     No current facility-administered medications for this visit.      Family History   Problem Relation Age of Onset    Aneurysm Other         died of a brain aneursym    64 / Stillbirths Other     Hypertension Mother     Arthritis Mother     Cancer Father     Prostate cancer Father     Other Sister         benign brain lesion     Allergy (severe) Sister     Arthritis Sister     Cancer Other         breast cancer    Cancer  Other 81        breast cancer    Cancer Other         breast cancer         Medication list reviewed and updated, no changes were made today    Exam: BP 118/76    Pulse 66    Temp 36.9 C (98.5 F) (Temporal)    Ht 1.6 m (5' 2.99")    Wt 52.3 kg (115 lb 6.4 oz)    SpO2 100%    BMI 20.45 kg/m   Well-nourished alert and conversant 62 year old lady in no acute distress.  Congested.  Tympanic membranes with dulled light reflex bite.  Nasal mucosa erythematous with exudate.  Frontal sinuses tender oral mucosa moist with posterior pharyngeal cobblestoning.  Patient also comments on the lesion in her mouth, near, there is a bony protrusion with slight overlying mucosal irritation/erythema  No mass.  No fluctuance.  Neck is supple without lymphadenopathy.  Lungs are clear bilaterally.  Cardiac exam reveals a normal S1-S2 with a regular rate and rhythm    A/P:  1.  Acute frontal sinusitis--will treat with doxycycline.  Continue nasal rinses and aggressive treatment for seasonal allergies  I do not as yet see the need to image the sinuses  If symptoms do not resolve with treatment, then this could be  considered  2.  Oral lesion-my inclination is to believe that the bony protuberance is chronic and that she just irritated the mucosal surface.  Salt water gargles for the next few days and if no improvement, see her dentist

## 2018-05-30 ENCOUNTER — Ambulatory Visit: Payer: PRIVATE HEALTH INSURANCE | Admitting: Neurology

## 2018-06-05 ENCOUNTER — Encounter: Payer: Self-pay | Admitting: Neurology

## 2018-06-05 ENCOUNTER — Ambulatory Visit: Payer: PRIVATE HEALTH INSURANCE | Attending: Neurology | Admitting: Neurology

## 2018-06-05 VITALS — BP 135/62 | HR 78 | Wt 115.0 lb

## 2018-06-05 DIAGNOSIS — R569 Unspecified convulsions: Secondary | ICD-10-CM

## 2018-06-07 NOTE — Progress Notes (Signed)
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  She has had no recurrent seizures and has been doing well on her usual anticonvulsant regimen.  Her most recent levels were well within the therapeutic range and unchanged from her norm.    She has been dealing with significant sinus congestion over the summer and has been at taking a combination of Sudafed and doxycycline.  When more severe, she does have some photosensitivity.    She continues to work 2 days a week which she finds to be a good balance.  She is attending physical therapy every other week.  Her coverage has been denied by her carrier.  She also continues to work with a Physiological scientist.    Review of systems is notable for occasional headache, continued tremor of her outstretched hands, occasional ringing in her ears, urinary leakage and some neck and back pain.    Examination:  Vitals:    06/05/18 1502   BP: 135/62   Pulse: 78   Weight: 52.2 kg (115 lb)     She is alert and oriented. Her speech is clear. Comprehension is intact. Visual fields are full to confrontation. She continues to have very mild end gaze nystagmus bilaterally. Facial strength is symmetric. She has full strength throughout. There is no significant ataxia. She has a bilateral sustention tremor.    Impression:  Stable seizure disorder, well controlled on Tegretol. Will hold on any changes.    I plan seeing her again in 6 months.  I will renew her standing order for carbamazepine levels.

## 2018-06-10 ENCOUNTER — Other Ambulatory Visit
Admission: RE | Admit: 2018-06-10 | Discharge: 2018-06-10 | Disposition: A | Discharge status: Home / Self Care | Payer: PRIVATE HEALTH INSURANCE | Source: Ambulatory Visit | Attending: Obstetrics | Admitting: Obstetrics

## 2018-06-10 DIAGNOSIS — Z124 Encounter for screening for malignant neoplasm of cervix: Secondary | ICD-10-CM | POA: Insufficient documentation

## 2018-06-10 DIAGNOSIS — Z1151 Encounter for screening for human papillomavirus (HPV): Secondary | ICD-10-CM | POA: Insufficient documentation

## 2018-06-14 LAB — GYN CYTOLOGY

## 2018-06-19 ENCOUNTER — Other Ambulatory Visit: Payer: Self-pay | Admitting: Interventional Cardiology

## 2018-06-19 DIAGNOSIS — I10 Essential (primary) hypertension: Secondary | ICD-10-CM

## 2018-06-19 MED ORDER — SPIRONOLACTONE-HCTZ 25-25 MG PO TABS: 0.5000 | ORAL_TABLET | Freq: Every day | ORAL | 3 refills | Status: DC

## 2018-06-19 NOTE — Telephone Encounter (Signed)
Pt's medication was sent to pt's pharmacy as requested. Confirmation received.  °

## 2018-07-12 ENCOUNTER — Ambulatory Visit: Payer: PRIVATE HEALTH INSURANCE | Admitting: Primary Care

## 2018-07-17 ENCOUNTER — Encounter: Payer: Self-pay | Admitting: Primary Care

## 2018-07-17 ENCOUNTER — Ambulatory Visit
Admission: RE | Admit: 2018-07-17 | Discharge: 2018-07-17 | Disposition: A | Discharge status: Home / Self Care | Payer: PRIVATE HEALTH INSURANCE | Source: Ambulatory Visit

## 2018-07-17 ENCOUNTER — Ambulatory Visit: Payer: PRIVATE HEALTH INSURANCE | Attending: Primary Care | Admitting: Primary Care

## 2018-07-17 VITALS — BP 118/68 | HR 76 | Ht 62.99 in | Wt 115.6 lb

## 2018-07-17 DIAGNOSIS — G44319 Acute post-traumatic headache, not intractable: Secondary | ICD-10-CM

## 2018-07-17 DIAGNOSIS — M62838 Other muscle spasm: Secondary | ICD-10-CM

## 2018-07-17 DIAGNOSIS — S0990XA Unspecified injury of head, initial encounter: Secondary | ICD-10-CM

## 2018-07-17 NOTE — NoShare Progress Note (Signed)
Reason for Visit: Head Injury (hit left forehead on car door last week, just wants it checked)    Last week, she was in [place] and was stepping down off the curb.  She was also opening up the car door and hit her head.  She did not lose consciousness.  Did not bleed.  Developed a large hematoma on her left forehead.  Over the last couple days, she has had more pain in the cranial area, locally, superficially  She has been experiencing discomfort in her neck  Left sided stiffness and achiness  The way her head feels is a familiear feeling when she hurts her neck    No dizziness  Nausea is similar to what she has had in the past with the neck probem  No vertigo  It hurt to touch, but she remained quite active     Patient Active Problem List   Diagnosis Code    Reported Trauma Neck     Allergic rhinitis J30.9    Irritable bowel syndrome K58.9    Essential hypertension I10    Dysplastic Nevus I78.1    Rosacea L71.9    Osteoporosis M81.0    Hypothyroidism E03.9    Scoliosis M41.20    Urge Incontinence Of Urine N39.41    A-V Malformation Repair Supratentorial Complex     Seizure disorder G40.909    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    Ringing In The Ears (Tinnitus) H93.19    GERD (gastroesophageal reflux disease) K21.9    Pain in joint, ankle and foot M25.579    Weakness of foot M21.40    Hyperlipidemia E78.5     Current Outpatient Prescriptions   Medication    RABEprazole (ACIPHEX) 20 MG tablet    azelastine (ASTELIN) 0.1 % nasal spray    generic DME    amLODIPine (NORVASC) 2.5 MG tablet    atorvastatin (LIPITOR) 10 MG tablet    levothyroxine (SYNTHROID, LEVOTHROID) 25 MCG tablet    carBAMazepine (CARBATROL) 100 MG 12 hr capsule    triamcinolone (KENALOG) 0.1 % ointment    carBAMazepine (TEGRETOL XR) 200 MG 12 hr tablet    darifenacin (ENABLEX) 15 MG 24 hr tablet    Non-System Medication    ranitidine (ZANTAC) 150 MG tablet    fluticasone (FLONASE) 50 MCG/ACT nasal spray     Calcium Carb-Cholecalciferol 600-200 MG-UNIT TABS    Denosumab (PROLIA SC)    loratadine (CLARITIN) 10 MG tablet    Multiple Vitamin (MULTIVITAMIN) per tablet    cholecalciferol (VITAMIN D) 1000 UNITS capsule     No current facility-administered medications for this visit.        Medication list reviewed and updated, no changes were made today    Exam: BP 118/68    Pulse 76    Ht 1.6 m (5' 2.99")    Wt 52.4 kg (115 lb 9.6 oz)    BMI 20.48 kg/m   Left cranial tenderness over the left forehead, hematoma has progressed to a yellow appearance above the eyebrow laterally  CN II-XII are intact; no pronator drift  Finger to nose testing was off on the right, although it improved with repeat testing  Strength is intact  She does have significant tension in the left trapezius  A/P:  1.  Head trauma--likely just superficial hematoma.  However, given the advancing pain and the abnormal finger to nose testing, I think it is prudent to get a CT scan to rule out intracranial hemorrhage  This was  negative and pt was notified  Continue planned chiropractic care for her trapezius tension and spasm

## 2018-07-27 ENCOUNTER — Other Ambulatory Visit: Payer: Self-pay | Admitting: Primary Care

## 2018-08-05 ENCOUNTER — Telehealth: Payer: Self-pay | Admitting: Primary Care

## 2018-08-05 ENCOUNTER — Other Ambulatory Visit: Payer: Self-pay | Admitting: Gastroenterology

## 2018-08-05 ENCOUNTER — Other Ambulatory Visit
Admission: RE | Admit: 2018-08-05 | Discharge: 2018-08-05 | Disposition: A | Discharge status: Home / Self Care | Payer: PRIVATE HEALTH INSURANCE | Source: Ambulatory Visit | Attending: Primary Care | Admitting: Primary Care

## 2018-08-05 DIAGNOSIS — E785 Hyperlipidemia, unspecified: Secondary | ICD-10-CM

## 2018-08-05 DIAGNOSIS — E561 Deficiency of vitamin K: Secondary | ICD-10-CM

## 2018-08-05 LAB — COMPREHENSIVE METABOLIC PANEL
ALT: 26 U/L (ref 0–35)
Albumin: 4.6 g/dL (ref 3.5–5.2)
Alk Phos: 80 U/L (ref 35–105)
Anion Gap: 12 (ref 7–16)
Bilirubin,Total: 0.3 mg/dL (ref 0.0–1.2)
CO2: 26 mmol/L (ref 20–28)
Calcium: 9.4 mg/dL (ref 8.6–10.2)
Chloride: 99 mmol/L (ref 96–108)
Creatinine: 0.66 mg/dL (ref 0.51–0.95)
GFR,Black: 107 *
GFR,Caucasian: 93 *
Glucose: 90 mg/dL (ref 60–99)
Lab: 18 mg/dL (ref 6–20)
Sodium: 137 mmol/L (ref 133–145)
Total Protein: 7.1 g/dL (ref 6.3–7.7)

## 2018-08-05 LAB — LIPID PANEL
Chol/HDL Ratio: 2.5
Cholesterol: 200 mg/dL — AB
HDL: 80 mg/dL
LDL Calculated: 98 mg/dL
Non HDL Cholesterol: 120 mg/dL
Triglycerides: 111 mg/dL

## 2018-08-05 LAB — TSH: TSH: 1.02 u[IU]/mL (ref 0.27–4.20)

## 2018-08-05 LAB — RESOLUTION

## 2018-08-05 NOTE — Telephone Encounter (Signed)
Recent sample sent in for potassium and ast cannot be processed due to them being hemolyzed.  Let them know if you want them to call pt to repeat test.

## 2018-08-05 NOTE — Telephone Encounter (Signed)
That is not necessary

## 2018-08-06 ENCOUNTER — Ambulatory Visit: Payer: PRIVATE HEALTH INSURANCE | Attending: Primary Care

## 2018-08-06 DIAGNOSIS — Z23 Encounter for immunization: Secondary | ICD-10-CM

## 2018-08-06 NOTE — Progress Notes (Signed)
Pt received half dose of flu shot today

## 2018-08-08 ENCOUNTER — Telehealth: Payer: Self-pay | Admitting: Primary Care

## 2018-08-08 NOTE — Telephone Encounter (Signed)
Got half dose flu shot on 08/06/18. Has follow up appt in one month .  Is that too long to wait for second half?

## 2018-08-09 ENCOUNTER — Ambulatory Visit: Payer: PRIVATE HEALTH INSURANCE | Admitting: Primary Care

## 2018-08-12 NOTE — Telephone Encounter (Signed)
It should be fine, it'll take two weeks for her to reach full response from the first one, so she could come anytime after that, but waiting the extra couple weeks should be fine.

## 2018-08-12 NOTE — Telephone Encounter (Signed)
Pt aware.

## 2018-08-22 ENCOUNTER — Other Ambulatory Visit: Payer: Self-pay | Admitting: Primary Care

## 2018-08-26 ENCOUNTER — Other Ambulatory Visit
Admission: RE | Admit: 2018-08-26 | Discharge: 2018-08-26 | Disposition: A | Discharge status: Home / Self Care | Payer: PRIVATE HEALTH INSURANCE | Source: Ambulatory Visit | Attending: Primary Care | Admitting: Primary Care

## 2018-08-26 DIAGNOSIS — E561 Deficiency of vitamin K: Secondary | ICD-10-CM | POA: Insufficient documentation

## 2018-08-26 LAB — CBC
Hematocrit: 41 % (ref 34–45)
Hemoglobin: 13.1 g/dL (ref 11.2–15.7)
MCH: 33 pg/cell — ABNORMAL HIGH (ref 26–32)
MCHC: 32 g/dL (ref 32–36)
MCV: 103 fL — ABNORMAL HIGH (ref 79–95)
Platelets: 205 10*3/uL (ref 160–370)
RBC: 4 MIL/uL (ref 3.9–5.2)
RDW: 12.7 % (ref 11.7–14.4)
WBC: 4.9 10*3/uL (ref 4.0–10.0)

## 2018-08-26 NOTE — Telephone Encounter (Signed)
error 

## 2018-08-28 ENCOUNTER — Ambulatory Visit: Payer: PRIVATE HEALTH INSURANCE | Admitting: Primary Care

## 2018-08-29 LAB — VITAMIN K: Vitamin K: 1.14 nmol/L (ref 0.22–4.88)

## 2018-08-30 ENCOUNTER — Telehealth: Payer: Self-pay | Admitting: Primary Care

## 2018-08-30 ENCOUNTER — Other Ambulatory Visit: Payer: Self-pay | Admitting: Gastroenterology

## 2018-08-30 LAB — RESOLUTION

## 2018-08-30 NOTE — Telephone Encounter (Signed)
Unable to add BMP and Magnesium levels to her blood, not enough blood in sample.

## 2018-08-30 NOTE — Telephone Encounter (Signed)
Okay, no need to send pt back

## 2018-09-01 NOTE — Progress Notes (Signed)
Subjective:    Patient ID: Ariel Simpson, female    DOB: 1956-04-05, 62 y.o.   MRN: [id number]  HPI She is here for a physical exam.   She was having sciatica like symptoms in the left buttock region but revising her yoga routine has made it resolve.    She has a lot of arthritis but doing regular exercise and keeping moving helps a lot.    Medications and allergies reviewed with patient and updated if appropriate.  Patient Active Problem List   Diagnosis Date Noted  . Hyperglycemia 08/21/2017  . Osteopenia 09/23/2016  . Need for prophylactic antibiotic 09/23/2016  . Tremor, benign 08/08/2016  . Hyperlipidemia 08/08/2016  . Insomnia 08/08/2016  . S/P mitral valve repair 03/18/2013  . S/P Maze operation for atrial fibrillation 03/18/2013  . Paroxysmal atrial fibrillation ([place]) 03/11/2013  . History of PSVT (paroxysmal supraventricular tachycardia) 03/03/2013    Class: Chronic    Current Outpatient Medications on File Prior to Visit  Medication Sig Dispense Refill  . aspirin 81 MG tablet Take 1 tablet (81 mg total) by mouth daily.    Marland Kitchen atorvastatin (LIPITOR) 20 MG tablet Take 1 tablet (20 mg total) by mouth daily. Annual appt is due must see provider for future refills 30 tablet 0  . Calcium Carbonate-Vitamin D (CALCIUM + D PO) Take 2 capsules by mouth daily.    . metoprolol succinate (TOPROL-XL) 50 MG 24 hr tablet TAKE 1 TABLET BY MOUTH EVERY DAY 90 tablet 3  . Omega-3 Fatty Acids (FISH OIL PO) Take 2 capsules by mouth daily.    . polyethylene glycol (MIRALAX / GLYCOLAX) packet Take 17 g by mouth daily.    Marland Kitchen spironolactone-hydrochlorothiazide (ALDACTAZIDE) 25-25 MG tablet Take 0.5 tablets by mouth daily. 45 tablet 3  . TURMERIC PO Take 1 tablet by mouth daily.     Marland Kitchen zolpidem (AMBIEN) 5 MG tablet TAKE ONE-HALF TABLET AT BEDTIME AS NEEDED FOR SLEEP 30 tablet 0   No current facility-administered medications on file prior to visit.     Past Medical History:  Diagnosis  Date  . CHF (congestive heart failure) ([place])   . Heart murmur   . High cholesterol   . Hypertension   . Mitral regurgitation   . Mitral valve prolapse   . Paroxysmal atrial fibrillation ([place]) 03/11/2013  . Paroxysmal supraventricular tachycardia ([place])   . PONV (postoperative nausea and vomiting)    Dizziness and Nausea  . S/P Maze operation for atrial fibrillation 03/18/2013   Complete bilateral atrial lesion set using cryothermy and biopolar radiofrequency ablation with oversewing of LA appendage via right mini thoracotomy  . S/P mitral valve repair 03/18/2013   Complex valvuloplasty including triangular resection of posterior leaflet, artificial Gore-tex neocord placement x4 and 37mm Sorin Memo 3D ring annuloplasty via right mini thoracotomy  . Severe mitral regurgitation 03/03/2013   Acute on chronic with acute heart failure   . Shortness of breath     Past Surgical History:  Procedure Laterality Date  . ABDOMINAL HYSTERECTOMY     hernia repair  . CARDIAC CATHETERIZATION    . INTRAOPERATIVE TRANSESOPHAGEAL ECHOCARDIOGRAM N/A 03/18/2013   Procedure: INTRAOPERATIVE TRANSESOPHAGEAL ECHOCARDIOGRAM;  Surgeon: Rexene Alberts, MD;  Location: [facility];  Service: Open Heart Surgery;  Laterality: N/A;  . LEFT AND RIGHT HEART CATHETERIZATION WITH CORONARY ANGIOGRAM N/A 03/06/2013   Procedure: LEFT AND RIGHT HEART CATHETERIZATION WITH CORONARY ANGIOGRAM;  Surgeon: Sinclair Grooms, MD;  Location: [facility] CATH LAB;  Service: Cardiovascular;  Laterality: N/A;  . MAZE N/A 03/18/2013   Procedure: MAZE;  Surgeon: Rexene Alberts, MD;  Location: [facility];  Service: Open Heart Surgery;  Laterality: N/A;  . MITRAL VALVE REPAIR Right 03/18/2013   Procedure: MINIMALLY INVASIVE MITRAL VALVE REPAIR (MVR);  Surgeon: Rexene Alberts, MD;  Location: [facility];  Service: Open Heart Surgery;  Laterality: Right;  . vericose veins    . WISDOM TOOTH EXTRACTION      Social History   Socioeconomic History  . Marital status:  Married    Spouse name: Fritz Pickerel  . Number of children: 3  . Years of education: BS  . Highest education level: Not on file  Occupational History    Employer: [employer]  Social Needs  . Financial resource strain: Not on file  . Food insecurity:    Worry: Not on file    Inability: Not on file  . Transportation needs:    Medical: Not on file    Non-medical: Not on file  Tobacco Use  . Smoking status: Never Smoker  . Smokeless tobacco: Never Used  Substance and Sexual Activity  . Alcohol use: Yes    Alcohol/week: 5.0 - 6.0 standard drinks    Types: 5 - 6 Glasses of wine per week    Comment: 1 drink/day  . Drug use: No  . Sexual activity: Yes  Lifestyle  . Physical activity:    Days per week: Not on file    Minutes per session: Not on file  . Stress: Not on file  Relationships  . Social connections:    Talks on phone: Not on file    Gets together: Not on file    Attends religious service: Not on file    Active member of club or organization: Not on file    Attends meetings of clubs or organizations: Not on file    Relationship status: Not on file  Other Topics Concern  . Not on file  Social History Narrative   Patient lives at home with her husband  Fritz Pickerel)  - second husband. Patient has college education B.S. 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   Caffeine- one cup daily.    Family History  Problem Relation Age of Onset  . Pancreatic cancer Mother   . Stomach cancer Father   . Hypertension Father   . Thyroid cancer Sister   . Colon cancer Paternal Grandmother   . Stomach cancer Paternal Grandfather   . Seizures Child   . Down syndrome Child     Review of Systems  Constitutional: Negative for chills and fever.  Eyes: Negative for visual disturbance.  Respiratory: Negative for cough, shortness of breath and wheezing.   Cardiovascular: Positive for palpitations (occ, not daily, no significant change). Negative for chest  pain and leg swelling.  Gastrointestinal: Positive for constipation. Negative for abdominal pain, blood in stool, diarrhea and nausea.       Rare gerd  Genitourinary: Negative for dysuria and hematuria.  Musculoskeletal: Positive for arthralgias.  Skin: Negative for color change and rash.  Neurological: Negative for dizziness, light-headedness, numbness and headaches.  Psychiatric/Behavioral: Negative for dysphoric mood. The patient is not nervous/anxious.        Objective:   Vitals:   09/03/18 0800  BP: 124/80  Resp: 16  Temp: 98.2 F (36.8 C)   Filed Weights   09/03/18 0800  Weight: 165 lb 1.9 oz (74.9 kg)  Body mass index is 25.86 kg/m.  Wt Readings from Last 3 Encounters:  09/03/18 165 lb 1.9 oz (74.9 kg)  04/01/18 168 lb 9.6 oz (76.5 kg)  02/21/18 169 lb (76.7 kg)     Physical Exam Constitutional: She appears well-developed and well-nourished. No distress.  HENT:  Head: Normocephalic and atraumatic.  Right Ear: External ear normal. Normal ear canal and TM Left Ear: External ear normal.  Normal ear canal and TM Mouth/Throat: Oropharynx is clear and moist.  Eyes: Conjunctivae and EOM are normal.  Neck: Neck supple. No tracheal deviation present. No thyromegaly present.  No carotid bruit  Cardiovascular: Normal rate, regular rhythm and normal heart sounds.   No murmur heard.  No edema.  Varicose veins b/l LE Pulmonary/Chest: Effort normal and breath sounds normal. No respiratory distress. She has no wheezes. She has no rales.  Breast: deferred to Gyn Abdominal: Soft. She exhibits no distension. There is no tenderness.  Lymphadenopathy: She has no cervical adenopathy.  Skin: Skin is warm and dry. She is not diaphoretic.  Psychiatric: She has a normal mood and affect. Her behavior is normal.        Assessment & Plan:   Physical exam: Screening blood work  ordered Immunizations  Flu vaccine done , discussed shingrix - had zostavax in 2016 Colonoscopy Up to  date  Mammogram    Up to date  Gyn   No longer seeing Dexa   Up to date  Eye exams   Due - will schedule EKG   Done 03/2018 Exercise  Does yoga Weight  Weight good for age Skin  - sees Dr Ubaldo Glassing as needed, no concerns Substance abuse   none  See Problem List for Assessment and Plan of chronic medical problems.   FU in one year

## 2018-09-01 NOTE — Patient Instructions (Addendum)
Tests ordered today. Your results will be released to MyChart (or called to you) after review, usually within 72hours after test completion. If any changes need to be made, you will be notified at that same time.  All other Health Maintenance issues reviewed.   All recommended immunizations and age-appropriate screenings are up-to-date or discussed.  No immunizations administered today.   Medications reviewed and updated.  Changes include :   none   Please followup in one year    Health Maintenance, Female Adopting a healthy lifestyle and getting preventive care can go a long way to promote health and wellness. Talk with your health care provider about what schedule of regular examinations is right for you. This is a good chance for you to check in with your provider about disease prevention and staying healthy. In between checkups, there are plenty of things you can do on your own. Experts have done a lot of research about which lifestyle changes and preventive measures are most likely to keep you healthy. Ask your health care provider for more information. Weight and diet Eat a healthy diet  Be sure to include plenty of vegetables, fruits, low-fat dairy products, and lean protein.  Do not eat a lot of foods high in solid fats, added sugars, or salt.  Get regular exercise. This is one of the most important things you can do for your health. ? Most adults should exercise for at least 150 minutes each week. The exercise should increase your heart rate and make you sweat (moderate-intensity exercise). ? Most adults should also do strengthening exercises at least twice a week. This is in addition to the moderate-intensity exercise.  Maintain a healthy weight  Body mass index (BMI) is a measurement that can be used to identify possible weight problems. It estimates body fat based on height and weight. Your health care provider can help determine your BMI and help you achieve or maintain a  healthy weight.  For females 20 years of age and older: ? A BMI below 18.5 is considered underweight. ? A BMI of 18.5 to 24.9 is normal. ? A BMI of 25 to 29.9 is considered overweight. ? A BMI of 30 and above is considered obese.  Watch levels of cholesterol and blood lipids  You should start having your blood tested for lipids and cholesterol at 64 years of age, then have this test every 5 years.  You may need to have your cholesterol levels checked more often if: ? Your lipid or cholesterol levels are high. ? You are older than 64 years of age. ? You are at high risk for heart disease.  Cancer screening Lung Cancer  Lung cancer screening is recommended for adults 55-80 years old who are at high risk for lung cancer because of a history of smoking.  A yearly low-dose CT scan of the lungs is recommended for people who: ? Currently smoke. ? Have quit within the past 15 years. ? Have at least a 30-pack-year history of smoking. A pack year is smoking an average of one pack of cigarettes a day for 1 year.  Yearly screening should continue until it has been 15 years since you quit.  Yearly screening should stop if you develop a health problem that would prevent you from having lung cancer treatment.  Breast Cancer  Practice breast self-awareness. This means understanding how your breasts normally appear and feel.  It also means doing regular breast self-exams. Let your health care provider know about any   changes, no matter how small.  If you are in your 20s or 30s, you should have a clinical breast exam (CBE) by a health care provider every 1-3 years as part of a regular health exam.  If you are 40 or older, have a CBE every year. Also consider having a breast X-ray (mammogram) every year.  If you have a family history of breast cancer, talk to your health care provider about genetic screening.  If you are at high risk for breast cancer, talk to your health care provider about  having an MRI and a mammogram every year.  Breast cancer gene (BRCA) assessment is recommended for women who have family members with BRCA-related cancers. BRCA-related cancers include: ? Breast. ? Ovarian. ? Tubal. ? Peritoneal cancers.  Results of the assessment will determine the need for genetic counseling and BRCA1 and BRCA2 testing.  Cervical Cancer Your health care provider may recommend that you be screened regularly for cancer of the pelvic organs (ovaries, uterus, and vagina). This screening involves a pelvic examination, including checking for microscopic changes to the surface of your cervix (Pap test). You may be encouraged to have this screening done every 3 years, beginning at age 21.  For women ages 30-65, health care providers may recommend pelvic exams and Pap testing every 3 years, or they may recommend the Pap and pelvic exam, combined with testing for human papilloma virus (HPV), every 5 years. Some types of HPV increase your risk of cervical cancer. Testing for HPV may also be done on women of any age with unclear Pap test results.  Other health care providers may not recommend any screening for nonpregnant women who are considered low risk for pelvic cancer and who do not have symptoms. Ask your health care provider if a screening pelvic exam is right for you.  If you have had past treatment for cervical cancer or a condition that could lead to cancer, you need Pap tests and screening for cancer for at least 20 years after your treatment. If Pap tests have been discontinued, your risk factors (such as having a new sexual partner) need to be reassessed to determine if screening should resume. Some women have medical problems that increase the chance of getting cervical cancer. In these cases, your health care provider may recommend more frequent screening and Pap tests.  Colorectal Cancer  This type of cancer can be detected and often prevented.  Routine colorectal cancer  screening usually begins at 64 years of age and continues through 64 years of age.  Your health care provider may recommend screening at an earlier age if you have risk factors for colon cancer.  Your health care provider may also recommend using home test kits to check for hidden blood in the stool.  A small camera at the end of a tube can be used to examine your colon directly (sigmoidoscopy or colonoscopy). This is done to check for the earliest forms of colorectal cancer.  Routine screening usually begins at age 50.  Direct examination of the colon should be repeated every 5-10 years through 64 years of age. However, you may need to be screened more often if early forms of precancerous polyps or small growths are found.  Skin Cancer  Check your skin from head to toe regularly.  Tell your health care provider about any new moles or changes in moles, especially if there is a change in a mole's shape or color.  Also tell your health care provider if   you have a mole that is larger than the size of a pencil eraser.  Always use sunscreen. Apply sunscreen liberally and repeatedly throughout the day.  Protect yourself by wearing long sleeves, pants, a wide-brimmed hat, and sunglasses whenever you are outside.  Heart disease, diabetes, and high blood pressure  High blood pressure causes heart disease and increases the risk of stroke. High blood pressure is more likely to develop in: ? People who have blood pressure in the high end of the normal range (130-139/85-89 mm Hg). ? People who are overweight or obese. ? People who are African American.  If you are 18-39 years of age, have your blood pressure checked every 3-5 years. If you are 40 years of age or older, have your blood pressure checked every year. You should have your blood pressure measured twice-once when you are at a hospital or clinic, and once when you are not at a hospital or clinic. Record the average of the two measurements.  To check your blood pressure when you are not at a hospital or clinic, you can use: ? An automated blood pressure machine at a pharmacy. ? A home blood pressure monitor.  If you are between 55 years and 79 years old, ask your health care provider if you should take aspirin to prevent strokes.  Have regular diabetes screenings. This involves taking a blood sample to check your fasting blood sugar level. ? If you are at a normal weight and have a low risk for diabetes, have this test once every three years after 64 years of age. ? If you are overweight and have a high risk for diabetes, consider being tested at a younger age or more often. Preventing infection Hepatitis B  If you have a higher risk for hepatitis B, you should be screened for this virus. You are considered at high risk for hepatitis B if: ? You were born in a country where hepatitis B is common. Ask your health care provider which countries are considered high risk. ? Your parents were born in a high-risk country, and you have not been immunized against hepatitis B (hepatitis B vaccine). ? You have HIV or AIDS. ? You use needles to inject street drugs. ? You live with someone who has hepatitis B. ? You have had sex with someone who has hepatitis B. ? You get hemodialysis treatment. ? You take certain medicines for conditions, including cancer, organ transplantation, and autoimmune conditions.  Hepatitis C  Blood testing is recommended for: ? Everyone born from 1945 through 1965. ? Anyone with known risk factors for hepatitis C.  Sexually transmitted infections (STIs)  You should be screened for sexually transmitted infections (STIs) including gonorrhea and chlamydia if: ? You are sexually active and are younger than 64 years of age. ? You are older than 64 years of age and your health care provider tells you that you are at risk for this type of infection. ? Your sexual activity has changed since you were last screened  and you are at an increased risk for chlamydia or gonorrhea. Ask your health care provider if you are at risk.  If you do not have HIV, but are at risk, it may be recommended that you take a prescription medicine daily to prevent HIV infection. This is called pre-exposure prophylaxis (PrEP). You are considered at risk if: ? You are sexually active and do not regularly use condoms or know the HIV status of your partner(s). ? You take drugs by   You are sexually active with a partner who has HIV.  Talk with your health care provider about whether you are at high risk of being infected with HIV. If you choose to begin PrEP, you should first be tested for HIV. You should then be tested every 3 months for as long as you are taking PrEP. Pregnancy  If you are premenopausal and you may become pregnant, ask your health care provider about preconception counseling.  If you may become pregnant, take 400 to 800 micrograms (mcg) of folic acid every day.  If you want to prevent pregnancy, talk to your health care provider about birth control (contraception). Osteoporosis and menopause  Osteoporosis is a disease in which the bones lose minerals and strength with aging. This can result in serious bone fractures. Your risk for osteoporosis can be identified using a bone density scan.  If you are 18 years of age or older, or if you are at risk for osteoporosis and fractures, ask your health care provider if you should be screened.  Ask your health care provider whether you should take a calcium or vitamin D supplement to lower your risk for osteoporosis.  Menopause may have certain physical symptoms and risks.  Hormone replacement therapy may reduce some of these symptoms and risks. Talk to your health care provider about whether hormone replacement therapy is right for you. Follow these instructions at home:  Schedule regular health, dental, and eye exams.  Stay current with your  immunizations.  Do not use any tobacco products including cigarettes, chewing tobacco, or electronic cigarettes.  If you are pregnant, do not drink alcohol.  If you are breastfeeding, limit how much and how often you drink alcohol.  Limit alcohol intake to no more than 1 drink per day for nonpregnant women. One drink equals 12 ounces of beer, 5 ounces of wine, or 1 ounces of hard liquor.  Do not use street drugs.  Do not share needles.  Ask your health care provider for help if you need support or information about quitting drugs.  Tell your health care provider if you often feel depressed.  Tell your health care provider if you have ever been abused or do not feel safe at home. This information is not intended to replace advice given to you by your health care provider. Make sure you discuss any questions you have with your health care provider. Document Released: 05/22/2011 Document Revised: 04/13/2016 Document Reviewed: 08/10/2015 Elsevier Interactive Patient Education  [employer].

## 2018-09-02 ENCOUNTER — Other Ambulatory Visit
Admission: RE | Admit: 2018-09-02 | Discharge: 2018-09-02 | Disposition: A | Discharge status: Home / Self Care | Payer: PRIVATE HEALTH INSURANCE | Source: Ambulatory Visit | Attending: Obstetrics | Admitting: Obstetrics

## 2018-09-02 ENCOUNTER — Other Ambulatory Visit: Payer: Self-pay | Admitting: Internal Medicine

## 2018-09-02 DIAGNOSIS — M810 Age-related osteoporosis without current pathological fracture: Secondary | ICD-10-CM | POA: Insufficient documentation

## 2018-09-02 LAB — MAGNESIUM: Magnesium: 2.1 mg/dL (ref 1.6–2.5)

## 2018-09-02 LAB — BASIC METABOLIC PANEL
Anion Gap: 10 (ref 7–16)
CO2: 32 mmol/L — ABNORMAL HIGH (ref 20–28)
Calcium: 10 mg/dL (ref 8.6–10.2)
Chloride: 96 mmol/L (ref 96–108)
Creatinine: 0.68 mg/dL (ref 0.51–0.95)
GFR,Black: 106 *
GFR,Caucasian: 92 *
Glucose: 97 mg/dL (ref 60–99)
Lab: 18 mg/dL (ref 6–20)
Potassium: 5 mmol/L (ref 3.3–5.1)
Sodium: 138 mmol/L (ref 133–145)

## 2018-09-03 ENCOUNTER — Encounter: Payer: Self-pay | Admitting: Primary Care

## 2018-09-03 ENCOUNTER — Ambulatory Visit: Payer: PRIVATE HEALTH INSURANCE | Admitting: Primary Care

## 2018-09-03 ENCOUNTER — Encounter: Payer: Self-pay | Admitting: Internal Medicine

## 2018-09-03 ENCOUNTER — Ambulatory Visit (INDEPENDENT_AMBULATORY_CARE_PROVIDER_SITE_OTHER): Payer: BLUE CROSS/BLUE SHIELD | Admitting: Internal Medicine

## 2018-09-03 ENCOUNTER — Other Ambulatory Visit (INDEPENDENT_AMBULATORY_CARE_PROVIDER_SITE_OTHER): Payer: BLUE CROSS/BLUE SHIELD

## 2018-09-03 VITALS — BP 124/80 | HR 58 | Temp 98.2°F | Resp 16 | Ht 67.0 in | Wt 165.1 lb

## 2018-09-03 DIAGNOSIS — R251 Tremor, unspecified: Secondary | ICD-10-CM

## 2018-09-03 DIAGNOSIS — E7849 Other hyperlipidemia: Secondary | ICD-10-CM

## 2018-09-03 DIAGNOSIS — M85851 Other specified disorders of bone density and structure, right thigh: Secondary | ICD-10-CM

## 2018-09-03 DIAGNOSIS — I480 Paroxysmal atrial fibrillation: Secondary | ICD-10-CM

## 2018-09-03 DIAGNOSIS — G4700 Insomnia, unspecified: Secondary | ICD-10-CM

## 2018-09-03 DIAGNOSIS — Z0000 Encounter for general adult medical examination without abnormal findings: Secondary | ICD-10-CM | POA: Diagnosis not present

## 2018-09-03 LAB — PCMH DEPRESSION ASSESSMENT

## 2018-09-03 LAB — LIPID PANEL
CHOL/HDL RATIO: 3
Cholesterol: 153 mg/dL (ref 0–200)
HDL: 56 mg/dL (ref >39.00)
LDL Cholesterol: 75 mg/dL (ref 0–99)
NONHDL: 97.21
Triglycerides: 110 mg/dL (ref 0.0–149.0)
VLDL: 22 mg/dL (ref 0.0–40.0)

## 2018-09-03 LAB — COMPREHENSIVE METABOLIC PANEL
ALT: 25 U/L (ref 0–35)
AST: 21 U/L (ref 0–37)
Albumin: 4.5 g/dL (ref 3.5–5.2)
Alkaline Phosphatase: 61 U/L (ref 39–117)
BILIRUBIN TOTAL: 0.6 mg/dL (ref 0.2–1.2)
BUN: 12 mg/dL (ref 6–23)
CO2: 30 meq/L (ref 19–32)
Calcium: 10.1 mg/dL (ref 8.4–10.5)
Chloride: 103 mEq/L (ref 96–112)
Creatinine, Ser: 0.94 mg/dL (ref 0.40–1.20)
GFR: 63.57 mL/min (ref >60.00)
GLUCOSE: 93 mg/dL (ref 70–99)
POTASSIUM: 4.6 meq/L (ref 3.5–5.1)
SODIUM: 140 meq/L (ref 135–145)
TOTAL PROTEIN: 6.9 g/dL (ref 6.0–8.3)

## 2018-09-03 LAB — CBC WITH DIFFERENTIAL/PLATELET
Basophils Absolute: 0 10*3/uL (ref 0.0–0.1)
Basophils Relative: 0.9 % (ref 0.0–3.0)
EOS PCT: 2.2 % (ref 0.0–5.0)
Eosinophils Absolute: 0.1 10*3/uL (ref 0.0–0.7)
HCT: 40.8 % (ref 36.0–46.0)
Hemoglobin: 13.9 g/dL (ref 12.0–15.0)
LYMPHS ABS: 2.1 10*3/uL (ref 0.7–4.0)
Lymphocytes Relative: 41.6 % (ref 12.0–46.0)
MCHC: 34.1 g/dL (ref 30.0–36.0)
MCV: 90.6 fl (ref 78.0–100.0)
MONO ABS: 0.5 10*3/uL (ref 0.1–1.0)
Monocytes Relative: 10.4 % (ref 3.0–12.0)
NEUTROS ABS: 2.3 10*3/uL (ref 1.4–7.7)
NEUTROS PCT: 44.9 % (ref 43.0–77.0)
PLATELETS: 207 10*3/uL (ref 150.0–400.0)
RBC: 4.5 Mil/uL (ref 3.87–5.11)
RDW: 12.6 % (ref 11.5–15.5)
WBC: 5.1 10*3/uL (ref 4.0–10.5)

## 2018-09-03 LAB — TSH: TSH: 2.87 u[IU]/mL (ref 0.35–4.50)

## 2018-09-03 NOTE — Assessment & Plan Note (Signed)
Takes amiben as needed only Will continue

## 2018-09-03 NOTE — Assessment & Plan Note (Signed)
S/p maze Following with cardio occ palps On metoprolol

## 2018-09-03 NOTE — Assessment & Plan Note (Signed)
Check lipid panel  Continue daily statin Regular exercise and healthy diet encouraged  

## 2018-09-03 NOTE — Assessment & Plan Note (Signed)
Slightly worse Will follow up with Dr Tat yearly

## 2018-09-03 NOTE — Assessment & Plan Note (Signed)
Exercising regularly dexa up to date Taking cal/vitamin d

## 2018-09-06 ENCOUNTER — Ambulatory Visit: Payer: PRIVATE HEALTH INSURANCE | Attending: Primary Care | Admitting: Primary Care

## 2018-09-06 ENCOUNTER — Encounter: Payer: Self-pay | Admitting: Primary Care

## 2018-09-06 VITALS — BP 132/90 | HR 76 | Ht 62.99 in | Wt 113.2 lb

## 2018-09-06 DIAGNOSIS — E785 Hyperlipidemia, unspecified: Secondary | ICD-10-CM

## 2018-09-06 DIAGNOSIS — E034 Atrophy of thyroid (acquired): Secondary | ICD-10-CM

## 2018-09-06 DIAGNOSIS — I10 Essential (primary) hypertension: Secondary | ICD-10-CM

## 2018-09-06 DIAGNOSIS — E538 Deficiency of other specified B group vitamins: Secondary | ICD-10-CM

## 2018-09-06 DIAGNOSIS — K219 Gastro-esophageal reflux disease without esophagitis: Secondary | ICD-10-CM

## 2018-09-06 DIAGNOSIS — G40909 Epilepsy, unspecified, not intractable, without status epilepticus: Secondary | ICD-10-CM

## 2018-09-06 DIAGNOSIS — E559 Vitamin D deficiency, unspecified: Secondary | ICD-10-CM

## 2018-09-06 DIAGNOSIS — K589 Irritable bowel syndrome without diarrhea: Secondary | ICD-10-CM

## 2018-09-06 DIAGNOSIS — E561 Deficiency of vitamin K: Secondary | ICD-10-CM

## 2018-09-06 DIAGNOSIS — Z23 Encounter for immunization: Secondary | ICD-10-CM

## 2018-09-06 MED ORDER — DOXYCYCLINE HYCLATE 100 MG PO TABS *I*
100.0000 mg | ORAL_TABLET | Freq: Two times a day (BID) | ORAL | 0 refills | Status: DC
Start: 2018-09-06 — End: 2018-10-16

## 2018-09-06 MED ORDER — FAMOTIDINE 20 MG PO TABS *I*
20.0000 mg | ORAL_TABLET | Freq: Two times a day (BID) | ORAL | 5 refills | Status: DC
Start: 2018-09-06 — End: 2018-12-16

## 2018-09-06 NOTE — NoShare Progress Note (Signed)
Reason for Visit: Hypertension    Has been under a great deal of stress.  Her husband has not been well.   Her brother in law had a vasovagal while driving and has sustained a concussion    She is not eating normally, breakfast and lunch she can eat fiber, [place], grapes, yogurt  Dinner has been soup, chicken and [place]  If she tries to ramp up the fiber, she gets diarrhea  Otherwise, in general, she is constipated  Did have pain when she was more emotional.     htn--remains on amlodipine, tolerating this well.  Denies CP SOB dizziness  GERD--every other day was not enough on the aciphex, so she is back to taking it daily.  Does also take ranitidine, and is aware that this has been implicated in a recall.     Reviewed her labs.  Vitamin K is normal (her rheumatologist had been testing this)    Hyperlipidemia--remains on low dose atorvastatin.  Total chol 200, TG 111, HDL 80, LDL 98    Sinuses have been full, would like me to look at her left ear, which has been feeling a bit pressured.  Denies discolored drainage.    Patient Active Problem List   Diagnosis Code    Reported Trauma Neck     Allergic rhinitis J30.9    Irritable bowel syndrome K58.9    Essential hypertension I10    Dysplastic Nevus I78.1    Rosacea L71.9    Osteoporosis M81.0    Hypothyroidism E03.9    Scoliosis M41.20    Urge Incontinence Of Urine N39.41    A-V Malformation Repair Supratentorial Complex     Seizure disorder G40.909    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    Ringing In The Ears (Tinnitus) H93.19    GERD (gastroesophageal reflux disease) K21.9    Pain in joint, ankle and foot M25.579    Weakness of foot R29.898    Hyperlipidemia E78.5     Current Outpatient Medications   Medication    azelastine (ASTELIN) 0.1 % nasal spray    levothyroxine (SYNTHROID, LEVOTHROID) 25 MCG tablet    RABEprazole (ACIPHEX) 20 MG tablet    generic DME    amLODIPine (NORVASC) 2.5 MG tablet    atorvastatin (LIPITOR) 10 MG tablet     carBAMazepine (CARBATROL) 100 MG 12 hr capsule    triamcinolone (KENALOG) 0.1 % ointment    carBAMazepine (TEGRETOL XR) 200 MG 12 hr tablet    darifenacin (ENABLEX) 15 MG 24 hr tablet    Non-System Medication    fluticasone (FLONASE) 50 MCG/ACT nasal spray    Calcium Carb-Cholecalciferol 600-200 MG-UNIT TABS    Denosumab (PROLIA SC)    loratadine (CLARITIN) 10 MG tablet    Multiple Vitamin (MULTIVITAMIN) per tablet    cholecalciferol (VITAMIN D) 1000 UNITS capsule    famotidine (PEPCID) 20 MG tablet    doxycycline hyclate (VIBRA-TABS) 100 MG tablet     No current facility-administered medications for this visit.        Medication list reviewed and updated, no changes were made today    Exam: BP 132/90    Pulse 76    Ht 1.6 m (5' 2.99")    Wt 51.3 kg (113 lb 3.2 oz)    BMI 20.06 kg/m   Frail 62 yo in nad. TM with serous effusion and slight dulling of light reflex on left, milder on right.  No erythema or exudate.  Nasal mucosa is erythematous on  the left , but with white, not green or yellow exudate  Lungs are CTA bilaterally, resp easy  Cardiac exam reveals nl S1 S2 RRR  Abdomen is soft, nontender.  Slightly hypoactive BS  Extremities are without edema    A/P:  1. HTN--reasonably well controlled on amlodipine, no changes were made today  2.  Hyperlipidemia--well controlled on atorvastatin  3.  GERD--continue aciphex.  Change prn ranitidine to famotidine  4.  Chronic sinusitis--does look like she might be headed toward another infection.  Sent script in for doxycycline to be used if nasal drainage becomes discolored  5.  Osteoporosis--continue prolia  Vitamin d, calcium and regular weight bearing exercise  6.  IBS--continue on bland diet d/t recent gastroenteritis. Soluble fiber to be added back first, then insoluble fiber  Pt got her second half of the flu shot today  Will administer Td at next visit

## 2018-09-16 ENCOUNTER — Other Ambulatory Visit
Admission: RE | Admit: 2018-09-16 | Discharge: 2018-09-16 | Disposition: A | Discharge status: Home / Self Care | Payer: PRIVATE HEALTH INSURANCE | Source: Ambulatory Visit | Attending: Primary Care | Admitting: Primary Care

## 2018-09-16 DIAGNOSIS — G40909 Epilepsy, unspecified, not intractable, without status epilepticus: Secondary | ICD-10-CM | POA: Insufficient documentation

## 2018-09-17 ENCOUNTER — Other Ambulatory Visit: Payer: Self-pay | Admitting: Primary Care

## 2018-09-18 ENCOUNTER — Ambulatory Visit: Payer: BLUE CROSS/BLUE SHIELD | Admitting: Podiatry

## 2018-09-18 ENCOUNTER — Encounter: Payer: Self-pay | Admitting: Podiatry

## 2018-09-18 VITALS — BP 119/78

## 2018-09-18 DIAGNOSIS — B351 Tinea unguium: Secondary | ICD-10-CM | POA: Diagnosis not present

## 2018-09-18 DIAGNOSIS — S91202A Unspecified open wound of left great toe with damage to nail, initial encounter: Secondary | ICD-10-CM

## 2018-09-19 LAB — CARBAMAZEPINE, FREE & TOTAL
Carbamazepine, Free: 21.8 % (ref 8.0–35.0)
Carbamazepine,Free: 3.1 ug/mL — ABNORMAL HIGH (ref 1.0–3.0)
Carbamazepine,Total: 14.2 ug/mL — ABNORMAL HIGH (ref 4.0–12.0)

## 2018-09-22 ENCOUNTER — Other Ambulatory Visit: Payer: Self-pay | Admitting: Internal Medicine

## 2018-09-23 ENCOUNTER — Encounter: Payer: Self-pay | Admitting: Neurology

## 2018-09-23 ENCOUNTER — Other Ambulatory Visit: Payer: BLUE CROSS/BLUE SHIELD

## 2018-09-23 NOTE — Progress Notes (Signed)
   Subjective: 62 year old female presenting today as a new patient with a chief complaint of possible toenail fungus that has been present for the past 10 years. She reports associated thickening and discoloration of the nails. She has used antifungal nail lacquer and OTC medications for treatment with no significant relief. She denies modifying factors. Patient is here for further evaluation and treatment.   Past Medical History:  Diagnosis Date  . CHF (congestive heart failure) ([place])   . Heart murmur   . High cholesterol   . Hypertension   . Mitral regurgitation   . Mitral valve prolapse   . Paroxysmal atrial fibrillation ([place]) 03/11/2013  . Paroxysmal supraventricular tachycardia ([place])   . PONV (postoperative nausea and vomiting)    Dizziness and Nausea  . S/P Maze operation for atrial fibrillation 03/18/2013   Complete bilateral atrial lesion set using cryothermy and biopolar radiofrequency ablation with oversewing of LA appendage via right mini thoracotomy  . S/P mitral valve repair 03/18/2013   Complex valvuloplasty including triangular resection of posterior leaflet, artificial Gore-tex neocord placement x4 and 68mm Sorin Memo 3D ring annuloplasty via right mini thoracotomy  . Severe mitral regurgitation 03/03/2013   Acute on chronic with acute heart failure   . Shortness of breath     Objective:  General: Well developed, nourished, in no acute distress, alert and oriented x3   Dermatology: Hyperkeratotic, discolored, thickened, onychodystrophy of nails 1-5 bilaterally. Partially detached nail plate of the left hallux. Skin is warm, dry and supple bilateral lower extremities. Negative for open lesions or macerations.  Vascular: Dorsalis Pedis artery and Posterior Tibial artery pedal pulses palpable. No lower extremity edema noted.   Neruologic: Grossly intact via light touch bilateral.  Musculoskeletal: Muscular strength within normal limits in all groups bilateral. Normal range  of motion noted to all pedal and ankle joints.   Assessment:  #1 Partially detached nail plate left hallux #2 onychomycosis nails 1-5 bilateral   Plan of Care:  1. Patient evaluated.  2. Discussed treatment alternatives and plan of care. Explained nail avulsion procedure and post procedure course to patient. 3. Patient opted for total temporary nail avulsion.  4. Prior to procedure, local anesthesia infiltration utilized using 3 ml of a 50:50 mixture of 2% plain lidocaine and 0.5% plain marcaine in a normal hallux block fashion and a betadine prep performed.  5. Light dressing applied. 6. Appointment with Janett Billow for laser treatment for fungal nails.  7. Return to clinic as needed.   Edrick Kins, DPM [facility]  Dr. Edrick Kins, [address]                                        [place], [place] [address]                Office [phone]  Fax [phone]

## 2018-09-24 ENCOUNTER — Other Ambulatory Visit: Payer: Self-pay | Admitting: Primary Care

## 2018-09-24 DIAGNOSIS — N3941 Urge incontinence: Secondary | ICD-10-CM

## 2018-10-02 ENCOUNTER — Telehealth: Payer: Self-pay | Admitting: Primary Care

## 2018-10-02 NOTE — Telephone Encounter (Signed)
Wonders who you would recommend for podiatry.  Dr Onnie Graham is booked way out.

## 2018-10-03 NOTE — Telephone Encounter (Signed)
You could give her the other group

## 2018-10-03 NOTE — Telephone Encounter (Signed)
Notified Pt via V/M

## 2018-10-09 ENCOUNTER — Ambulatory Visit (INDEPENDENT_AMBULATORY_CARE_PROVIDER_SITE_OTHER): Payer: BLUE CROSS/BLUE SHIELD

## 2018-10-09 DIAGNOSIS — B351 Tinea unguium: Secondary | ICD-10-CM

## 2018-10-10 NOTE — Progress Notes (Signed)
Pt presents with mycotic infection of nails 1-5 bilateral  All other systems are negative  Laser therapy administered to affected nails and tolerated well. All safety precautions were in place.  2nd treatment. Dispensed tolcyclen  Follow up in 4 weeks

## 2018-10-16 ENCOUNTER — Other Ambulatory Visit: Payer: Self-pay | Admitting: Primary Care

## 2018-10-16 MED ORDER — FLUTICASONE PROPIONATE (INHAL) 250 MCG/BLIST IN AEPB *I*
1.0000 | INHALATION_SPRAY | Freq: Two times a day (BID) | RESPIRATORY_TRACT | 1 refills | Status: DC
Start: 2018-10-16 — End: 2022-04-26

## 2018-10-16 MED ORDER — DOXYCYCLINE HYCLATE 100 MG PO TABS *I*
100.0000 mg | ORAL_TABLET | Freq: Two times a day (BID) | ORAL | 0 refills | Status: DC
Start: 2018-10-16 — End: 2018-12-16

## 2018-10-16 NOTE — Progress Notes (Signed)
New generic for advair (wixela) causing tremulousness  This was the case with proventil in past    Trial to change to flovent alone  Also renewed doxy d/t sinusitis

## 2018-10-24 ENCOUNTER — Encounter: Payer: Self-pay | Admitting: Neurology

## 2018-10-25 ENCOUNTER — Telehealth: Payer: Self-pay | Admitting: Neurology

## 2018-10-25 NOTE — Telephone Encounter (Signed)
PT called and states she would like a return call to discuss her mychart message she sent regarding answering questions on DMV form. Pt can be reached at [phone]

## 2018-10-26 ENCOUNTER — Other Ambulatory Visit: Payer: Self-pay | Admitting: Primary Care

## 2018-10-28 ENCOUNTER — Other Ambulatory Visit: Payer: Self-pay | Admitting: Primary Care

## 2018-10-28 DIAGNOSIS — E785 Hyperlipidemia, unspecified: Secondary | ICD-10-CM

## 2018-11-07 ENCOUNTER — Encounter: Payer: Self-pay | Admitting: Gastroenterology

## 2018-11-07 ENCOUNTER — Ambulatory Visit: Payer: PRIVATE HEALTH INSURANCE | Attending: Neurology | Admitting: Neurology

## 2018-11-07 ENCOUNTER — Encounter: Payer: Self-pay | Admitting: Neurology

## 2018-11-07 ENCOUNTER — Ambulatory Visit: Payer: BLUE CROSS/BLUE SHIELD

## 2018-11-07 VITALS — BP 142/82 | HR 79 | Wt 115.0 lb

## 2018-11-07 DIAGNOSIS — B351 Tinea unguium: Secondary | ICD-10-CM

## 2018-11-07 DIAGNOSIS — R569 Unspecified convulsions: Secondary | ICD-10-CM

## 2018-11-08 NOTE — Progress Notes (Signed)
Pt presents with mycotic infection of nails 1-5 bilateral.  All other systems are negative  Laser therapy administered to affected nails and tolerated well. All safety precautions were in place.  3rd treatment.  Follow up in 4 weeks     

## 2018-11-12 NOTE — Progress Notes (Signed)
I had the pleasure of seeing her today in follow-up of her history of a cerebral vascular malformation residual seizure disorder.  He needs to have occasional word finding pauses but overall has been doing quite well.  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  He continues to participate in physical therapy with her personal trainer.    Her 16 point review of systems is notable for her ongoing tremor, tinnitus, occasional heartburn and neck and back pain.    Examination:  Vitals:    11/07/18 0837   BP: 142/82   Pulse: 79   Weight: 52.2 kg (115 lb)     She is alert and oriented.  Her speech is clear.comprehension is intact.    Visual fields are full to confrontation.  She has mild end gaze nystagmus bilaterally she is unchanged.  Facial strength is symmetric.  She has no significant ataxia but continues to have a stable tremor.    Impression: Stable seizure disorder currently well controlled on Tegretol.    I completed her paperwork for the DMV.  We discussed essential treatment for essential tremor but she would like to hold off now.  Plan seeing her again in 6 months.

## 2018-11-18 ENCOUNTER — Other Ambulatory Visit: Payer: Self-pay | Admitting: Primary Care

## 2018-11-18 MED ORDER — FAMOTIDINE 40 MG PO TABS *I*
40.0000 mg | ORAL_TABLET | Freq: Every evening | ORAL | 3 refills | Status: DC
Start: 2018-11-18 — End: 2018-12-16

## 2018-12-12 ENCOUNTER — Ambulatory Visit: Payer: Self-pay

## 2018-12-12 DIAGNOSIS — B351 Tinea unguium: Secondary | ICD-10-CM

## 2018-12-16 ENCOUNTER — Ambulatory Visit: Payer: Medicare Other | Attending: Primary Care | Admitting: Primary Care

## 2018-12-16 ENCOUNTER — Encounter: Payer: Self-pay | Admitting: Gastroenterology

## 2018-12-16 VITALS — BP 114/80 | HR 66 | Ht 62.99 in | Wt 116.4 lb

## 2018-12-16 DIAGNOSIS — I10 Essential (primary) hypertension: Secondary | ICD-10-CM | POA: Insufficient documentation

## 2018-12-16 DIAGNOSIS — E034 Atrophy of thyroid (acquired): Secondary | ICD-10-CM | POA: Insufficient documentation

## 2018-12-16 DIAGNOSIS — M810 Age-related osteoporosis without current pathological fracture: Secondary | ICD-10-CM | POA: Insufficient documentation

## 2018-12-16 DIAGNOSIS — E7800 Pure hypercholesterolemia, unspecified: Secondary | ICD-10-CM | POA: Insufficient documentation

## 2018-12-16 DIAGNOSIS — K219 Gastro-esophageal reflux disease without esophagitis: Secondary | ICD-10-CM

## 2018-12-16 DIAGNOSIS — Z23 Encounter for immunization: Secondary | ICD-10-CM | POA: Insufficient documentation

## 2018-12-16 LAB — PCMH FALL RISK ASSESSMENT

## 2018-12-16 LAB — PCMH DEPRESSION ASSESSMENT

## 2018-12-16 LAB — PCMH FALL RISK PLAN

## 2018-12-16 MED ORDER — FAMOTIDINE 20 MG PO TABS *I*
20.0000 mg | ORAL_TABLET | Freq: Two times a day (BID) | ORAL | 3 refills | Status: DC
Start: 2018-12-16 — End: 2019-05-28

## 2018-12-16 NOTE — Patient Instructions (Addendum)
pls ask them to do the standing order for carbamazepine PLUS the comprehensive, lipid, vitamin B12, D, K , CBC, TSH

## 2018-12-16 NOTE — NoShare Progress Note (Signed)
Reason for Visit: Hypertension    GERD--Stomach seems to be worse, on the famotidine 40mg  once daily, will trial 20mg  bid.      Osteoporosis--remains on prolia, with insurance change, she is hoping to continue to get this at her GYN office.    Dr. Johna Sheriff has noted a tremor essential tremor, she has had it for quite some time in her hand, someone mentioned that they saw it in her neck as well. Wondering if there is anything that can be done.    Also notes some additional excess skin over the lids and wonders what is involved in getting that removed.    PT is going to resume today; every other week.  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, as well as gait unsteadiness from carbamazepine    Urinary frequency is a chronic issue, unchanged  Denies CP SOB dizziness  Taking BP meds, tolerating fine along with her atorvastatin for the hyperlipidemia  Patient Active Problem List   Diagnosis Code    Reported Trauma Neck     Allergic rhinitis J30.9    Irritable bowel syndrome K58.9    Essential hypertension I10    Dysplastic Nevus I78.1    Rosacea L71.9    Osteoporosis M81.0    Hypothyroidism E03.9    Scoliosis M41.20    Urge Incontinence Of Urine N39.41    A-V Malformation Repair Supratentorial Complex     Seizure disorder G40.909    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    Ringing In The Ears (Tinnitus) H93.19    GERD (gastroesophageal reflux disease) K21.9    Pain in joint, ankle and foot M25.579    Weakness of foot R29.898    Hyperlipidemia E78.5     Current Outpatient Medications   Medication    famotidine (PEPCID) 40 MG tablet    triamcinolone (KENALOG) 0.1 % ointment    carBAMazepine (CARBATROL) 100 MG 12 hr capsule    atorvastatin (LIPITOR) 10 MG tablet    fluticasone (FLOVENT DISKUS) 250 MCG/BLIST diskus inhaler    darifenacin (ENABLEX) 15 MG 24  hr tablet    carBAMazepine (TEGRETOL XR) 200 MG 12 hr tablet    azelastine (ASTELIN) 0.1 % nasal spray    levothyroxine (SYNTHROID, LEVOTHROID) 25 MCG tablet    RABEprazole (ACIPHEX) 20 MG tablet    generic DME    amLODIPine (NORVASC) 2.5 MG tablet    Non-System Medication    fluticasone (FLONASE) 50 MCG/ACT nasal spray    Calcium Carb-Cholecalciferol 600-200 MG-UNIT TABS    Denosumab (PROLIA SC)    loratadine (CLARITIN) 10 MG tablet    Multiple Vitamin (MULTIVITAMIN) per tablet    cholecalciferol (VITAMIN D) 1000 UNITS capsule     No current facility-administered medications for this visit.        Medication list reviewed and updated, changes were made today    Exam: BP 114/80    Pulse 66    Ht 1.6 m (5' 2.99")    Wt 52.8 kg (116 lb 6.4 oz)    BMI 20.62 kg/m   Well-nourished alert and conversant, no acute distress.  Neck is supple without lad, lungs are CTA bilaterally, resp easy.  Cardiac exam reveals nl S1S2 RRR.  Abdomen is soft, nontender, normoactive BS appreciated.  No organomegaly noted.  Extremities reveal 2+DP and PT pulses and no edema.  Per pt request,  breast exam was completed which revealed no evidence of mass or axillary adenopathy.  Fibrocystic changes noted, unchanged    A/P:  1. GErD--continue antireflux measures, change famotidine to 20mg  bid  PPI at low dose, aciphex 20mg  (trying to limit d/t osteoporosis)  2. Osteoporosis--continue prolia  3. Hypothyroidism--continue levothyroxine, check tsh and adjust accordingly  4. Hyperlipidemia--check fasting lipid profile and adjust atorvastatin accordingly  5. HTN--well controlled on amlodipine  16months CPE  Td administered today  Plan for prevnar at next visit

## 2018-12-16 NOTE — Progress Notes (Signed)
Pt presents with mycotic infection of nails 1-5 bilateral  All other systems are negative  Laser therapy administered to affected nails and tolerated well. All safety precautions were in place. 4th treatment.  Follow up in 4 weeks     

## 2018-12-23 DIAGNOSIS — H5213 Myopia, bilateral: Secondary | ICD-10-CM | POA: Diagnosis not present

## 2018-12-23 DIAGNOSIS — H43812 Vitreous degeneration, left eye: Secondary | ICD-10-CM | POA: Diagnosis not present

## 2018-12-23 DIAGNOSIS — H52203 Unspecified astigmatism, bilateral: Secondary | ICD-10-CM | POA: Diagnosis not present

## 2018-12-24 ENCOUNTER — Ambulatory Visit: Payer: Medicare Other | Attending: Neurology | Admitting: Neurology

## 2018-12-24 ENCOUNTER — Encounter: Payer: Self-pay | Admitting: Neurology

## 2018-12-24 VITALS — BP 128/76 | HR 72 | Temp 98.6°F | Ht 62.99 in | Wt 116.8 lb

## 2018-12-24 DIAGNOSIS — J019 Acute sinusitis, unspecified: Secondary | ICD-10-CM | POA: Insufficient documentation

## 2018-12-24 MED ORDER — DOXYCYCLINE HYCLATE 100 MG PO TABS *I*
100.0000 mg | ORAL_TABLET | Freq: Two times a day (BID) | ORAL | 0 refills | Status: DC
Start: 2018-12-24 — End: 2019-01-30

## 2018-12-29 ENCOUNTER — Other Ambulatory Visit: Payer: Self-pay | Admitting: Interventional Cardiology

## 2018-12-29 DIAGNOSIS — I10 Essential (primary) hypertension: Secondary | ICD-10-CM

## 2018-12-30 NOTE — Progress Notes (Signed)
SUBJECTIVE:  Developed sinus congestion, increased postnasal drip and cough over the weekend.  Periorbital facial pressure.  Expectorating clear nasal secretions.  Denies fever, chills, body aches.  Historically develops sinusitis with change in atmospheric pressure.   Has been taking loratadine and Mucinex.  Is using fluticasone and azelastine nasal sprays daily.    Medication regime reconciled.    Current Outpatient Medications   Medication    famotidine (PEPCID) 20 MG tablet    triamcinolone (KENALOG) 0.1 % ointment    carBAMazepine (CARBATROL) 100 MG 12 hr capsule    atorvastatin (LIPITOR) 10 MG tablet    fluticasone (FLOVENT DISKUS) 250 MCG/BLIST diskus inhaler    darifenacin (ENABLEX) 15 MG 24 hr tablet    carBAMazepine (TEGRETOL XR) 200 MG 12 hr tablet    azelastine (ASTELIN) 0.1 % nasal spray    levothyroxine (SYNTHROID, LEVOTHROID) 25 MCG tablet    RABEprazole (ACIPHEX) 20 MG tablet    generic DME    amLODIPine (NORVASC) 2.5 MG tablet    Non-System Medication    fluticasone (FLONASE) 50 MCG/ACT nasal spray    Calcium Carb-Cholecalciferol 600-200 MG-UNIT TABS    Denosumab (PROLIA SC)    loratadine (CLARITIN) 10 MG tablet    Multiple Vitamin (MULTIVITAMIN) per tablet    cholecalciferol (VITAMIN D) 1000 UNITS capsule    doxycycline hyclate (VIBRA-TABS) 100 MG tablet     No current facility-administered medications for this visit.        OBJECTIVE:    Blood pressure 128/76, pulse 72, temperature 37 C (98.6 F), height 1.6 m (5' 2.99"), weight 53 kg (116 lb 12.8 oz), SpO2 100 %.  Appears comfortable.  Ear canals clear.  TMs dull.  Scant amount of serous fluid noted bilaterally.  No erythema.  Mild frontal and maxillary sinus tenderness.  Posterior oropharynx noninjected.  Neck supple.  Lungs fields clear.    IMPRESSION/PLAN:  Acute rhinosinusitis.  Reassured that there is no evidence of acute bacterial sinus infection on exam.  Due to short symptom duration, have encouraged her to continue  conservative management-expectorant, antihistamine, steroid based nasal sprays, humidified air, fluids.  Provided with a prescription for doxycycline to begin only if symptoms persist or worsen over the next 72 hours.  FU with Dr. Cloyde Reams as scheduled.

## 2018-12-31 ENCOUNTER — Telehealth: Payer: Self-pay | Admitting: Internal Medicine

## 2018-12-31 MED ORDER — ATORVASTATIN CALCIUM 20 MG PO TABS: ORAL_TABLET | ORAL | 1 refills | Status: DC

## 2018-12-31 NOTE — Telephone Encounter (Signed)
Reviewed chart pt is up-to-date sent refills to new pharmacy.Marland KitchenJohny Chess

## 2018-12-31 NOTE — Telephone Encounter (Signed)
Copied from [place] [id number]. Topic: Quick Communication - Rx Refill/Question >> Dec 31, 2018 11:11 AM [name] wrote: Medication: atorvastatin (LIPITOR) 20 MG tablet  Has the patient contacted their pharmacy? No ( Pt changed pharmacy due to change in insurance)   Preferred Pharmacy (with phone number or street name): [facility] [place], [place] - [address] [phone] (Phone) [phone] (Fax)    Agent: Please be advised that RX refills may take up to 3 business days. We ask that you follow-up with your pharmacy.

## 2019-01-09 ENCOUNTER — Ambulatory Visit: Payer: Self-pay

## 2019-01-09 DIAGNOSIS — B351 Tinea unguium: Secondary | ICD-10-CM

## 2019-01-09 NOTE — Progress Notes (Signed)
Pt presents with mycotic infection of nails 1-5 bilateral All other systems are negative  Laser therapy administered to affected nails and tolerated well. All safety precautions were in place. 5th treatment.  Follow up in 4 weeks     

## 2019-01-14 MED ORDER — SPIRONOLACTONE-HCTZ 25-25 MG PO TABS: 0.5000 | ORAL_TABLET | Freq: Every day | ORAL | 0 refills | Status: DC

## 2019-01-14 NOTE — Addendum Note (Signed)
Addended by: Derl Barrow on: 01/14/2019 04:46 PM   Modules accepted: Orders

## 2019-01-14 NOTE — Telephone Encounter (Signed)
Pt's medication was sent to pt's pharmacy as requested. Confirmation received.  °

## 2019-01-20 ENCOUNTER — Other Ambulatory Visit
Admission: RE | Admit: 2019-01-20 | Discharge: 2019-01-20 | Disposition: A | Discharge status: Home / Self Care | Payer: Medicare Other | Source: Ambulatory Visit | Attending: Primary Care | Admitting: Primary Care

## 2019-01-20 ENCOUNTER — Telehealth: Payer: Self-pay | Admitting: Primary Care

## 2019-01-20 DIAGNOSIS — I10 Essential (primary) hypertension: Secondary | ICD-10-CM

## 2019-01-20 DIAGNOSIS — E559 Vitamin D deficiency, unspecified: Secondary | ICD-10-CM | POA: Insufficient documentation

## 2019-01-20 DIAGNOSIS — E538 Deficiency of other specified B group vitamins: Secondary | ICD-10-CM

## 2019-01-20 DIAGNOSIS — E561 Deficiency of vitamin K: Secondary | ICD-10-CM | POA: Insufficient documentation

## 2019-01-20 DIAGNOSIS — E034 Atrophy of thyroid (acquired): Secondary | ICD-10-CM

## 2019-01-20 DIAGNOSIS — M810 Age-related osteoporosis without current pathological fracture: Secondary | ICD-10-CM

## 2019-01-20 DIAGNOSIS — G40909 Epilepsy, unspecified, not intractable, without status epilepticus: Secondary | ICD-10-CM

## 2019-01-20 DIAGNOSIS — E785 Hyperlipidemia, unspecified: Secondary | ICD-10-CM | POA: Insufficient documentation

## 2019-01-20 LAB — COMPREHENSIVE METABOLIC PANEL
ALT: 25 U/L (ref 0–35)
AST: 21 U/L (ref 0–35)
Albumin: 4.5 g/dL (ref 3.5–5.2)
Alk Phos: 76 U/L (ref 35–105)
Anion Gap: 8 (ref 7–16)
Bilirubin,Total: 0.2 mg/dL (ref 0.0–1.2)
CO2: 30 mmol/L — ABNORMAL HIGH (ref 20–28)
Calcium: 9.5 mg/dL (ref 8.6–10.2)
Chloride: 100 mmol/L (ref 96–108)
Creatinine: 0.58 mg/dL (ref 0.51–0.95)
GFR,Black: 112 *
GFR,Caucasian: 97 *
Glucose: 87 mg/dL (ref 60–99)
Lab: 20 mg/dL (ref 6–20)
Potassium: 4.6 mmol/L (ref 3.3–5.1)
Sodium: 138 mmol/L (ref 133–145)
Total Protein: 6.9 g/dL (ref 6.3–7.7)

## 2019-01-20 LAB — LIPID PANEL
Chol/HDL Ratio: 1.9
Cholesterol: 210 mg/dL — AB
HDL: 110 mg/dL — ABNORMAL HIGH (ref 40–60)
LDL Calculated: 87 mg/dL
Non HDL Cholesterol: 100 mg/dL
Triglycerides: 65 mg/dL

## 2019-01-20 LAB — CBC
Hematocrit: 39 % (ref 34–45)
Hemoglobin: 12.1 g/dL (ref 11.2–15.7)
MCH: 32 pg/cell (ref 26–32)
MCHC: 31 g/dL — ABNORMAL LOW (ref 32–36)
MCV: 102 fL — ABNORMAL HIGH (ref 79–95)
Platelets: 259 10*3/uL (ref 160–370)
RBC: 3.8 MIL/uL — ABNORMAL LOW (ref 3.9–5.2)
RDW: 13.4 % (ref 11.7–14.4)
WBC: 4.7 10*3/uL (ref 4.0–10.0)

## 2019-01-20 LAB — VITAMIN D: 25-OH Vit Total: 65 ng/mL — ABNORMAL HIGH (ref 30–60)

## 2019-01-20 LAB — RESOLUTION

## 2019-01-20 LAB — TSH: TSH: 3.12 u[IU]/mL (ref 0.27–4.20)

## 2019-01-20 LAB — VITAMIN B12: Vitamin B12: 1375 pg/mL — ABNORMAL HIGH (ref 232–1245)

## 2019-01-20 NOTE — Telephone Encounter (Signed)
See above

## 2019-01-20 NOTE — Telephone Encounter (Signed)
Yes, I put it in so she can go back

## 2019-01-20 NOTE — Telephone Encounter (Signed)
Can you call her tomorrow and tell her to repeat blood work.

## 2019-01-20 NOTE — Telephone Encounter (Signed)
Her vitamin K level was not received in a timely manor so it was not processed.  Let them know if you want it re done.

## 2019-01-21 NOTE — Telephone Encounter (Signed)
LVM for patient

## 2019-01-23 ENCOUNTER — Telehealth: Payer: Self-pay | Admitting: Primary Care

## 2019-01-23 LAB — CARBAMAZEPINE, FREE & TOTAL
Carbamazepine, Free: 26.6 % (ref 8.0–35.0)
Carbamazepine,Free: 2.9 ug/mL (ref 1.0–3.0)
Carbamazepine,Total: 10.9 ug/mL (ref 4.0–12.0)

## 2019-01-23 NOTE — Telephone Encounter (Signed)
Would reduce from 3000 international unit vitamin d daily to 2000iu daily  Also she can even wait until next month if she wants, that would be 6 months from the last

## 2019-01-23 NOTE — Telephone Encounter (Signed)
Perfect, pls change that on her med list

## 2019-01-23 NOTE — Telephone Encounter (Signed)
Pt calling to say that she will go redo blood work, but in the next couple of weeks.  Is all bruised from last draw.  Wants to wait ,  Ok?      Also she saw her vit d is high.  Wonders what to do

## 2019-01-23 NOTE — Telephone Encounter (Signed)
Pt aware to change vit D , but she has been taking 2000iu  Daily, will change to 1000iu daily.

## 2019-01-24 ENCOUNTER — Telehealth: Payer: Self-pay | Admitting: Primary Care

## 2019-01-24 NOTE — Telephone Encounter (Signed)
1.  The montelukast warning is about suicidal thoughts and actions.  Not a new concern, but just a stronger warning for physicians.  She has not had these effects from the medication, so it is still considered safe  2. I would need to know formulary alternatives for enablex

## 2019-01-24 NOTE — Telephone Encounter (Signed)
They saw an ad that montelukast is not safe.  Please advise.  Also, her enablex is no longer on her formulary.  Should she change?

## 2019-01-24 NOTE — Telephone Encounter (Signed)
01/24/19    Writer left patient detailed message, advised to call office with any questions or concerns.

## 2019-01-29 ENCOUNTER — Other Ambulatory Visit: Payer: Self-pay | Admitting: Primary Care

## 2019-01-30 ENCOUNTER — Telehealth: Payer: Self-pay | Admitting: Primary Care

## 2019-01-30 MED ORDER — DOXYCYCLINE HYCLATE 100 MG PO TABS *I*
100.0000 mg | ORAL_TABLET | Freq: Two times a day (BID) | ORAL | 1 refills | Status: DC
Start: 2019-01-30 — End: 2019-02-20

## 2019-01-30 NOTE — Telephone Encounter (Signed)
I did the same thing for Collie Siad.   Sent the script in, with one refill just in case (for both)  No need to come in.  Call if sx change

## 2019-01-30 NOTE — Telephone Encounter (Signed)
Complains of bilat ear fullness, HA off and on, PND for two days, no fever, no cough, no SOB, no recent travel.  Would like antibx to keep on hand

## 2019-01-31 ENCOUNTER — Telehealth: Payer: Self-pay | Admitting: Primary Care

## 2019-01-31 MED ORDER — OSELTAMIVIR PHOSPHATE 75 MG PO CAPS *I*
75.0000 mg | ORAL_CAPSULE | Freq: Every day | ORAL | 0 refills | Status: DC
Start: 2019-01-31 — End: 2019-02-20

## 2019-01-31 NOTE — Telephone Encounter (Signed)
Prophylaxis with tamiflu d/t husband's illness

## 2019-02-03 DIAGNOSIS — Z803 Family history of malignant neoplasm of breast: Secondary | ICD-10-CM | POA: Diagnosis not present

## 2019-02-03 DIAGNOSIS — Z1231 Encounter for screening mammogram for malignant neoplasm of breast: Secondary | ICD-10-CM | POA: Diagnosis not present

## 2019-02-03 LAB — HM MAMMOGRAPHY

## 2019-02-06 ENCOUNTER — Telehealth: Payer: Self-pay | Admitting: Primary Care

## 2019-02-06 NOTE — Telephone Encounter (Signed)
Pt aware and agreeable with plan.  

## 2019-02-06 NOTE — Telephone Encounter (Signed)
Again, would recommend she stay home, keep monitoring temp and symptoms.  Stay hydrated.  She is doing all the right things; if nasal congestion gets worse, can add the netti pot

## 2019-02-06 NOTE — Telephone Encounter (Signed)
Is on Tamiflu and antibx.  Temp 99.4 today, mainly her symptoms are head congestion, clear phlegm.  Was getting better, then two nights ago, she got worse again.  Using astelin nasal spray, flonase, mucinex , claritin.  Hasn't used netti pot.  No cough, no SOB.  She just wanted to check in again

## 2019-02-07 ENCOUNTER — Encounter: Payer: Self-pay | Admitting: Internal Medicine

## 2019-02-11 ENCOUNTER — Telehealth: Payer: Self-pay | Admitting: Primary Care

## 2019-02-11 ENCOUNTER — Other Ambulatory Visit: Payer: Self-pay | Admitting: Internal Medicine

## 2019-02-11 MED ORDER — MIRABEGRON ER 25 MG PO TB24 *I*
25.0000 mg | ORAL_TABLET | Freq: Every day | ORAL | 5 refills | Status: DC
Start: 2019-02-11 — End: 2019-02-12

## 2019-02-11 NOTE — Telephone Encounter (Signed)
Bladder meds covered by insurance would be Myrbetriq, and Oxybutynin

## 2019-02-11 NOTE — Telephone Encounter (Signed)
pls let her know that I have sent myrbetriq to her pharmacy as insurance will no longer cover enablex.  Must try this and/or oxybutynin.  The oxybutynin is more likely to cause dry mouth, constipation than the myrbetriq.  I have already looked up to make sure it will not interfere with her carbamazepine, and these do not interact.  I have not seen it do so, but there is reported risk of elevated BPs.  I would just have her monitor her BPs while starting.  I have started her on the lowest dose

## 2019-02-11 NOTE — Telephone Encounter (Signed)
Patient aware.

## 2019-02-12 ENCOUNTER — Other Ambulatory Visit: Payer: Self-pay | Admitting: Primary Care

## 2019-02-12 DIAGNOSIS — J309 Allergic rhinitis, unspecified: Secondary | ICD-10-CM

## 2019-02-12 MED ORDER — SOLIFENACIN SUCCINATE 10 MG PO TABS *I*
10.0000 mg | ORAL_TABLET | Freq: Every day | ORAL | 5 refills | Status: DC
Start: 2019-02-12 — End: 2019-04-28

## 2019-02-12 MED ORDER — ZOLPIDEM TARTRATE 5 MG PO TABS: ORAL_TABLET | ORAL | 0 refills | Status: DC

## 2019-02-12 NOTE — Addendum Note (Signed)
Addended by: Su Monks on: 02/12/2019 03:11 PM     Modules accepted: Orders

## 2019-02-12 NOTE — Telephone Encounter (Signed)
pls call pt.  It turns out that her insurance just sent me a list of alternative medications that is broader than what was initially reported by the prior auth team.  They said either oxybutynin or myrbetriq.  This also has solifenacin and tolterodine EF.  The Solifenacin would actually be in the same class as the Darifenacin that she was on.  That might be a smarter choice as the side effect profile and tolerability should be more similar.  I apologize for the change in the message between yesterday and today.  Would she like me to send that one instead?

## 2019-02-12 NOTE — Telephone Encounter (Signed)
Pt aware would like solifenacin    Also~ the allergist that they used has moved from [facility] to [facility] system.  Needs referral for appt.  Dr. Darcus Austin.  For both Pilar Plate and Mahayla.  For Frank~ allergies   For Collie Siad allergies and sinus problems.

## 2019-02-12 NOTE — Telephone Encounter (Signed)
Last refill was 09/18/18 Last OV 09/03/18 Next OV 09/05/19

## 2019-02-13 ENCOUNTER — Other Ambulatory Visit: Payer: Self-pay

## 2019-02-18 ENCOUNTER — Telehealth: Payer: Self-pay | Admitting: Allergy and Immunology

## 2019-02-18 NOTE — Telephone Encounter (Signed)
Spoke with patient and scheduled her with Dr. Darcus Austin on 06/25/2019

## 2019-02-18 NOTE — Telephone Encounter (Signed)
Copied from [place] [id number]. Topic: Appointments - Schedule Appointment  >> Feb 18, 2019  2:03 PM Bing Matter wrote:  Ms.Kittelson is requesting a call back to schedule a NPV, per referral documentation.     Please contact Ms.Nembhard at [phone], to schedule.

## 2019-02-20 ENCOUNTER — Ambulatory Visit: Payer: Medicare Other | Attending: Primary Care | Admitting: Primary Care

## 2019-02-20 DIAGNOSIS — J309 Allergic rhinitis, unspecified: Secondary | ICD-10-CM | POA: Insufficient documentation

## 2019-02-20 DIAGNOSIS — J329 Chronic sinusitis, unspecified: Secondary | ICD-10-CM | POA: Insufficient documentation

## 2019-02-20 MED ORDER — PSEUDOEPHEDRINE HCL 120 MG PO TB12 *I*
120.0000 mg | ORAL_TABLET | Freq: Two times a day (BID) | ORAL | 1 refills | Status: DC
Start: 2019-02-20 — End: 2019-07-08

## 2019-02-20 NOTE — NoShare Progress Note (Signed)
Telephone Visit     This is an established patient visit.    Reason for visit: No chief complaint on file.      HPI:  During the the flu thing, we had done   She stopped the antibiotic on Sunday  Monday/Tuesday, feels congested, sinus congestion, post nasal drip   Had low grade temp and body aches, fatigue  Postnasal drip    Taking flovent, did take it throughout the URI  Post nasal drip  No trouble breathing/SOB  Coughs sometimes, like a throat clearing, but no deeper cough    No diarrhea.     Patient's problem list, allergies, and medications were reviewed and updated as appropriate. Please see the EHR for full details.    Assessment Plan:  Resume doxycycline, as she seemed to decline as soon as she stopped the doxycycline.   Taking sudafed, but running out, will send script for this so she can have it delivered and avoid the pharmacy d/t her compromised health as well as her husband's  Continue flovent    She had many questions regarding whether this could have been COVID, I think it is less likely to be the case, her symptoms have all been much more focused on the sinuses than the chest    The plan was discussed with the patient and the patient/patient rep demonstrated understanding to the provider's satisfaction.    Consent was previously obtained from the patient to complete this telephone consult; including the potential for financial liability.    21+ minutes was spent on the phone with the patient, patient representatives, and/or other attendees.

## 2019-02-24 ENCOUNTER — Other Ambulatory Visit: Payer: Self-pay | Admitting: Primary Care

## 2019-02-24 ENCOUNTER — Ambulatory Visit: Payer: BLUE CROSS/BLUE SHIELD | Admitting: Neurology

## 2019-02-24 MED ORDER — AZELASTINE HCL 137 MCG/SPRAY NA SOLN *I*
2.0000 | Freq: Two times a day (BID) | NASAL | 2 refills | Status: DC
Start: 2019-02-24 — End: 2020-03-31

## 2019-02-25 ENCOUNTER — Encounter: Payer: Self-pay | Admitting: Gastroenterology

## 2019-03-17 ENCOUNTER — Ambulatory Visit: Payer: Medicare Other | Attending: Primary Care | Admitting: Primary Care

## 2019-03-17 DIAGNOSIS — K219 Gastro-esophageal reflux disease without esophagitis: Secondary | ICD-10-CM | POA: Insufficient documentation

## 2019-03-17 DIAGNOSIS — E7800 Pure hypercholesterolemia, unspecified: Secondary | ICD-10-CM | POA: Insufficient documentation

## 2019-03-17 DIAGNOSIS — K589 Irritable bowel syndrome without diarrhea: Secondary | ICD-10-CM | POA: Insufficient documentation

## 2019-03-17 DIAGNOSIS — E559 Vitamin D deficiency, unspecified: Secondary | ICD-10-CM | POA: Insufficient documentation

## 2019-03-17 DIAGNOSIS — E034 Atrophy of thyroid (acquired): Secondary | ICD-10-CM

## 2019-03-17 DIAGNOSIS — M810 Age-related osteoporosis without current pathological fracture: Secondary | ICD-10-CM | POA: Insufficient documentation

## 2019-03-17 DIAGNOSIS — G40909 Epilepsy, unspecified, not intractable, without status epilepticus: Secondary | ICD-10-CM

## 2019-03-17 DIAGNOSIS — N3941 Urge incontinence: Secondary | ICD-10-CM | POA: Insufficient documentation

## 2019-03-17 DIAGNOSIS — I10 Essential (primary) hypertension: Secondary | ICD-10-CM

## 2019-03-17 NOTE — Progress Notes (Signed)
Due to pandemic event, visit performed via:        Video   Location of Patient: home  Location of Telemedicine Provider: clinical office   Other participants in telemedicine encounter and roles: n/a    Consent was obtained from the patient to complete this video visit; including the potential for financial liability.    This visit was performed during a pandemic event and the vital signs including BMI and physical exam are limited or missing due to the patient's location.    Reason for Visit: Hypertension      Patient Active Problem List   Diagnosis Code    Reported Trauma Neck     Allergic rhinitis J30.9    Irritable bowel syndrome K58.9    Essential hypertension I10    Dysplastic Nevus I78.1    Rosacea L71.9    Osteoporosis M81.0    Hypothyroidism E03.9    Scoliosis M41.20    Urge Incontinence Of Urine N39.41    A-V Malformation Repair Supratentorial Complex     Seizure disorder G40.909    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    Ringing In The Ears (Tinnitus) H93.19    GERD (gastroesophageal reflux disease) K21.9    Pain in joint, ankle and foot M25.579    Weakness of foot R29.898    Hyperlipidemia E78.5     Current Outpatient Medications   Medication    azelastine (ASTELIN) 0.1 % nasal spray    pseudoephedrine (SUDAFED 12 HOUR) 120 MG 12 hr tablet    solifenacin (VESICARE) 10 MG tablet    famotidine (PEPCID) 20 MG tablet    triamcinolone (KENALOG) 0.1 % ointment    carBAMazepine (CARBATROL) 100 MG 12 hr capsule    atorvastatin (LIPITOR) 10 MG tablet    fluticasone (FLOVENT DISKUS) 250 MCG/BLIST diskus inhaler    carBAMazepine (TEGRETOL XR) 200 MG 12 hr tablet    levothyroxine (SYNTHROID, LEVOTHROID) 25 MCG tablet    RABEprazole (ACIPHEX) 20 MG tablet    amLODIPine (NORVASC) 2.5 MG tablet    Non-System Medication    fluticasone (FLONASE) 50 MCG/ACT nasal spray    Calcium Carb-Cholecalciferol 600-200 MG-UNIT TABS    Denosumab (PROLIA SC)    loratadine (CLARITIN) 10 MG  tablet    Multiple Vitamin (MULTIVITAMIN) per tablet    cholecalciferol (VITAMIN D) 1000 UNITS capsule    generic DME     No current facility-administered medications for this visit.        Medication list reviewed and updated, no changes were made today    Exam: There were no vitals taken for this visit.    A/P:

## 2019-03-17 NOTE — NoShare Progress Note (Signed)
Due to pandemic event, visit performed via:        Telephone     This is an established patient visit.    Reason for visit: Hypertension      HPI:  Osteoporosis-She is due for her prolia in about 2-3 weeks  Would like her to pursue, even if it is a delay perhaps 1-2 months at most    Okay to delay:  Dr. Vella Raring regular GYN visit  Mammogram  Chiropractic     Stomach issues go up and down; would like referral to go back to see gastroenterology when the pandemic settles down, to discuss her upper GI symptoms    Overactive bladder--will be changing from darifenacin to solifenacin due to formulary change given her new insurance     Remains on amlodipine for her blood pressure.  Denies chest pain shortness breath dizziness or blurred vision.  Remains on atorvastatin for hyperlipidemia and denies myalgia.  She had lab work done in early March which revealed a total cholesterol of 210, triglycerides 65, HDL 110 and LDL 87.  Liver function tests normal.    Carbamazepine level was within normal limits.  She remains seizure-free.    Patient's problem list, allergies, and medications were reviewed and updated as appropriate. Please see the EHR for full details.    Physical Exam:    This visit was performed during a pandemic event, thus the physical exam was not performed.    Assessment Plan:  1.  Osteoporosis-continue to pursue prolia, okay to delay for a month or so but no longer  2.  Hyperlipidemia-well controlled on low-dose atorvastatin, no changes were made today.  3.  Hypertension-continue amlodipine.  4.  Overactive bladder-reassured patient that both medications are's in the same class so hopefully she will tolerate the solifenacin just fine  5.  Referral to gastroenterology placed for GERD  6.  Hypothyroidism-TSH within normal limits on current dose of levothyroxine    The plan was discussed with the patient and the patient/patient rep demonstrated understanding to the provider's satisfaction.    Consent was previously  obtained from the patient to complete this telephone consult; including the potential for financial liability.    21+ minutes were spent on the phone with the patient, patient representatives, and/or other attendees.               Leeann Must, MD

## 2019-03-18 ENCOUNTER — Encounter: Payer: Self-pay | Admitting: Primary Care

## 2019-03-19 ENCOUNTER — Encounter: Payer: Self-pay | Admitting: Internal Medicine

## 2019-03-24 ENCOUNTER — Encounter: Payer: Self-pay | Admitting: Gastroenterology

## 2019-03-25 ENCOUNTER — Other Ambulatory Visit: Payer: Self-pay

## 2019-04-11 ENCOUNTER — Other Ambulatory Visit: Payer: Self-pay | Admitting: Internal Medicine

## 2019-04-11 ENCOUNTER — Other Ambulatory Visit: Payer: Self-pay | Admitting: Interventional Cardiology

## 2019-04-11 DIAGNOSIS — I10 Essential (primary) hypertension: Secondary | ICD-10-CM

## 2019-04-11 MED ORDER — METOPROLOL SUCCINATE ER 50 MG PO TB24: 50.0000 mg | ORAL_TABLET | Freq: Every day | ORAL | 0 refills | Status: DC

## 2019-04-15 ENCOUNTER — Ambulatory Visit: Payer: BLUE CROSS/BLUE SHIELD | Admitting: Interventional Cardiology

## 2019-04-17 ENCOUNTER — Other Ambulatory Visit: Payer: Self-pay | Admitting: Primary Care

## 2019-04-17 MED ORDER — FAMOTIDINE 40 MG PO TABS *I*
40.0000 mg | ORAL_TABLET | Freq: Every evening | ORAL | 3 refills | Status: DC
Start: 2019-04-17 — End: 2019-09-24

## 2019-04-17 NOTE — Progress Notes (Signed)
Famotidine supply issue on the 20mg    Will prescribe 40mg 

## 2019-04-23 ENCOUNTER — Other Ambulatory Visit: Payer: Self-pay | Admitting: Primary Care

## 2019-04-23 ENCOUNTER — Telehealth: Payer: Self-pay | Admitting: Interventional Cardiology

## 2019-04-23 NOTE — Telephone Encounter (Signed)
New Message              Patient is calling to get advise about the prep for her colonoscopy. Pls advise

## 2019-04-23 NOTE — Telephone Encounter (Signed)
Left message to call back.  If pt calling about holding ASA for colonoscopy, she will need to have office performing procedure reach out to our office with information needed for surgical clearance.

## 2019-04-25 ENCOUNTER — Other Ambulatory Visit: Payer: Self-pay | Admitting: Internal Medicine

## 2019-04-25 NOTE — Telephone Encounter (Signed)
Last refill was 02/12/19 Last OV 09/03/18 Next OV 09/05/19

## 2019-04-28 ENCOUNTER — Other Ambulatory Visit: Payer: Self-pay | Admitting: Primary Care

## 2019-04-28 MED ORDER — SOLIFENACIN SUCCINATE 10 MG PO TABS *I*
10.0000 mg | ORAL_TABLET | Freq: Every day | ORAL | 3 refills | Status: DC
Start: 2019-04-28 — End: 2019-12-01

## 2019-04-28 NOTE — Telephone Encounter (Signed)
Also wonders what they can replace famotidine with .  It's not been available for a while.

## 2019-04-28 NOTE — Telephone Encounter (Signed)
Can you sign refill for solifenacin

## 2019-05-07 ENCOUNTER — Other Ambulatory Visit: Payer: Self-pay | Admitting: Primary Care

## 2019-05-07 DIAGNOSIS — Z0184 Encounter for antibody response examination: Secondary | ICD-10-CM

## 2019-05-09 ENCOUNTER — Ambulatory Visit: Payer: BLUE CROSS/BLUE SHIELD | Admitting: Neurology

## 2019-05-15 ENCOUNTER — Telehealth: Payer: Self-pay | Admitting: Primary Care

## 2019-05-15 MED ORDER — DOXYCYCLINE HYCLATE 100 MG PO TABS *I*
100.0000 mg | ORAL_TABLET | Freq: Two times a day (BID) | ORAL | 1 refills | Status: DC
Start: 2019-05-15 — End: 2019-07-21

## 2019-05-15 NOTE — Telephone Encounter (Signed)
Head and ears are full, eyes are light sensitive.  PND.  No fever, no phlegm production.  sxs started three days ago.  Thinks it's a sinus infection and wonders if she should have an antibx.

## 2019-05-15 NOTE — Telephone Encounter (Signed)
05/15/19    Writer left patient detailed message, advised to call office with any questions or concerns.

## 2019-05-15 NOTE — Telephone Encounter (Signed)
I sent in the script for doxy.  I know she gets these often and trust her judgment to start antibiotic if it feels like her typical sinus infection

## 2019-05-28 ENCOUNTER — Other Ambulatory Visit: Payer: Self-pay | Admitting: Primary Care

## 2019-05-28 MED ORDER — FAMOTIDINE 20 MG PO TABS *I*
20.0000 mg | ORAL_TABLET | Freq: Two times a day (BID) | ORAL | 3 refills | Status: DC
Start: 2019-05-28 — End: 2021-06-09

## 2019-05-28 MED ORDER — PSEUDOEPHEDRINE HCL 30 MG PO TABS *I*
30.0000 mg | ORAL_TABLET | ORAL | 5 refills | Status: DC | PRN
Start: 2019-05-28 — End: 2019-10-22

## 2019-05-30 ENCOUNTER — Telehealth: Payer: Self-pay | Admitting: Allergy and Immunology

## 2019-05-30 ENCOUNTER — Other Ambulatory Visit
Admission: RE | Admit: 2019-05-30 | Discharge: 2019-05-30 | Disposition: A | Discharge status: Home / Self Care | Payer: Medicare Other | Source: Ambulatory Visit | Attending: Primary Care | Admitting: Primary Care

## 2019-05-30 ENCOUNTER — Other Ambulatory Visit
Admission: RE | Admit: 2019-05-30 | Discharge: 2019-05-30 | Disposition: A | Discharge status: Home / Self Care | Payer: Medicare Other | Source: Ambulatory Visit

## 2019-05-30 DIAGNOSIS — G40909 Epilepsy, unspecified, not intractable, without status epilepticus: Secondary | ICD-10-CM

## 2019-05-30 DIAGNOSIS — E034 Atrophy of thyroid (acquired): Secondary | ICD-10-CM | POA: Insufficient documentation

## 2019-05-30 DIAGNOSIS — M810 Age-related osteoporosis without current pathological fracture: Secondary | ICD-10-CM | POA: Insufficient documentation

## 2019-05-30 DIAGNOSIS — I10 Essential (primary) hypertension: Secondary | ICD-10-CM | POA: Insufficient documentation

## 2019-05-30 DIAGNOSIS — E7800 Pure hypercholesterolemia, unspecified: Secondary | ICD-10-CM | POA: Insufficient documentation

## 2019-05-30 DIAGNOSIS — E559 Vitamin D deficiency, unspecified: Secondary | ICD-10-CM | POA: Insufficient documentation

## 2019-05-30 DIAGNOSIS — Z0184 Encounter for antibody response examination: Secondary | ICD-10-CM | POA: Insufficient documentation

## 2019-05-30 DIAGNOSIS — K219 Gastro-esophageal reflux disease without esophagitis: Secondary | ICD-10-CM | POA: Insufficient documentation

## 2019-05-30 LAB — BASIC METABOLIC PANEL
Anion Gap: 12 (ref 7–16)
CO2: 29 mmol/L — ABNORMAL HIGH (ref 20–28)
Calcium: 10.2 mg/dL (ref 8.6–10.2)
Chloride: 96 mmol/L (ref 96–108)
Creatinine: 0.65 mg/dL (ref 0.51–0.95)
GFR,Black: 107 *
GFR,Caucasian: 93 *
Glucose: 98 mg/dL (ref 60–99)
Lab: 16 mg/dL (ref 6–20)
Potassium: 4.5 mmol/L (ref 3.3–5.1)
Sodium: 137 mmol/L (ref 133–145)

## 2019-05-30 LAB — COMPREHENSIVE METABOLIC PANEL
ALT: 28 U/L (ref 0–35)
AST: 23 U/L (ref 0–35)
Albumin: 4.7 g/dL (ref 3.5–5.2)
Alk Phos: 90 U/L (ref 35–105)
Anion Gap: 12 (ref 7–16)
Bilirubin,Total: 0.4 mg/dL (ref 0.0–1.2)
CO2: 29 mmol/L — ABNORMAL HIGH (ref 20–28)
Calcium: 10.2 mg/dL (ref 8.6–10.2)
Chloride: 97 mmol/L (ref 96–108)
Creatinine: 0.64 mg/dL (ref 0.51–0.95)
GFR,Black: 108 *
GFR,Caucasian: 94 *
Glucose: 98 mg/dL (ref 60–99)
Lab: 16 mg/dL (ref 6–20)
Potassium: 4.5 mmol/L (ref 3.3–5.1)
Sodium: 138 mmol/L (ref 133–145)
Total Protein: 7 g/dL (ref 6.3–7.7)

## 2019-05-30 LAB — LIPID PANEL
Chol/HDL Ratio: 1.8
Cholesterol: 209 mg/dL — AB
HDL: 113 mg/dL — ABNORMAL HIGH (ref 40–60)
LDL Calculated: 82 mg/dL
Non HDL Cholesterol: 96 mg/dL
Triglycerides: 70 mg/dL

## 2019-05-30 LAB — VITAMIN D: 25-OH Vit Total: 57 ng/mL (ref 30–60)

## 2019-05-30 LAB — CBC
Hematocrit: 41 % (ref 34–45)
Hemoglobin: 13.2 g/dL (ref 11.2–15.7)
MCH: 32 pg/cell (ref 26–32)
MCHC: 33 g/dL (ref 32–36)
MCV: 99 fL — ABNORMAL HIGH (ref 79–95)
Platelets: 275 10*3/uL (ref 160–370)
RBC: 4.1 MIL/uL (ref 3.9–5.2)
RDW: 13.2 % (ref 11.7–14.4)
WBC: 4.6 10*3/uL (ref 4.0–10.0)

## 2019-05-30 LAB — TSH: TSH: 2.44 u[IU]/mL (ref 0.27–4.20)

## 2019-05-30 LAB — SARS-COV-2 IGG AB, NUCLEOCAPSID PROTEIN: SARS-CoV-2 IgG AB, Nucleocapsid Protein: NEGATIVE

## 2019-05-30 LAB — MAGNESIUM: Magnesium: 2.1 mg/dL (ref 1.6–2.5)

## 2019-05-30 NOTE — Telephone Encounter (Unsigned)
Copied from [place] [phone]. Topic: Return Call - Speak to Provider/Office Staff  >> May 30, 2019  3:55 PM Doren Custard wrote:  [name] is returning a call from [place]. She states this is regarding scheduling. Patient confirmed the 7/27 appointment.

## 2019-06-01 LAB — CARBAMAZEPINE, FREE & TOTAL
Carbamazepine, Free: 19.6 % (ref 8.0–35.0)
Carbamazepine,Free: 1.9 ug/mL (ref 1.0–3.0)
Carbamazepine,Total: 9.7 ug/mL (ref 4.0–12.0)

## 2019-06-02 LAB — VITAMIN K: Vitamin K: 4.34 nmol/L (ref 0.22–4.88)

## 2019-06-04 ENCOUNTER — Telehealth: Payer: Self-pay | Admitting: Primary Care

## 2019-06-04 NOTE — Telephone Encounter (Signed)
Let them know I am not overly concerned  Remind them that our local incidence is very low right now and they were outdoors   Monitor for sx

## 2019-06-04 NOTE — Telephone Encounter (Signed)
Was in the company of a delivery person yesterday for about 5-6 minutes .  No physical contact.  Encounter was outside.  Very anxious, looking for direction.  Asked that you call her.  I explained you are seeing patients all day .

## 2019-06-04 NOTE — Telephone Encounter (Signed)
Pt aware.

## 2019-06-05 ENCOUNTER — Telehealth: Payer: Self-pay | Admitting: Allergy and Immunology

## 2019-06-05 NOTE — Telephone Encounter (Signed)
Copied from [place] [phone]. Topic: Appointments - Reschedule Appointment  >> Jun 05, 2019  2:26 PM Leslie Dales wrote:  [name] will like to change appt to Video Appt. Patient stated she still does not feel comfortable coming into office. Patient can be reached at [phone]. Patient appt is scheduled on 06/16/2019 at 1:00pm.

## 2019-06-06 NOTE — Telephone Encounter (Signed)
Spoke with patient and changed appointment on 7/27 to a video consult.

## 2019-06-16 ENCOUNTER — Ambulatory Visit: Payer: Medicare Other | Attending: Allergy and Immunology | Admitting: Allergy and Immunology

## 2019-06-16 ENCOUNTER — Ambulatory Visit: Payer: Medicare Other | Admitting: Allergy and Immunology

## 2019-06-16 DIAGNOSIS — R519 Headache, unspecified: Secondary | ICD-10-CM

## 2019-06-16 DIAGNOSIS — Z880 Allergy status to penicillin: Secondary | ICD-10-CM

## 2019-06-16 DIAGNOSIS — R51 Headache: Secondary | ICD-10-CM

## 2019-06-16 DIAGNOSIS — J309 Allergic rhinitis, unspecified: Secondary | ICD-10-CM | POA: Insufficient documentation

## 2019-06-16 MED ORDER — BECLOMETHASONE DIPROPIONATE 80 MCG/ACT NA AERS *I*
2.0000 | INHALATION_SPRAY | Freq: Every day | NASAL | 3 refills | Status: DC
Start: 2019-06-16 — End: 2023-07-26

## 2019-06-16 NOTE — Progress Notes (Signed)
PRIMARY CARE PHYSICIAN: Yesenia Must, MD   REFERRING PHYSICIAN: Leeann Must, MD    Chief complaint:establish care    Thank you for referring your patient, [name], for evaluation at the [facility]. [name] is a 63 y.o. female who presents for evaluation of allergies.   She would like to review her current medications  In general she thinks her allergies are ok. She gets 2-3 sinus infections per year and these are treated with doxycycline  She is currently taking mucinex 1 at each meal, claritin 10mg , flonase 2 sprays each nostril daily and astelin 3 times per day. She is taking sudafed in the morning and midday. She has fullness in her head and cheeks. She feels like the light is too much and her eyes feel full when she does not take this. She has reduced the dose of sudafed. She is doing 120mg  in the am and 30 mg in the middle of the day.   She is on BP meds and her blood pressure is typically good.    She takes flovent 220 when needed. She uses this 3-4 days per year.    She had hives with penicillin as a baby.  She denies a history of asthma, allergic rhinitis, food allergy, eczema, urticaria, GERD, recurrent infections, and stinging insect allergy.     ROS : positives in bold  Constitutional: Negative for fever, night sweats, fatigue, changes in weight, or recent illness  Eyes: negative for redness and swelling  Ears/Nose: Negative for ear pain, rhinorrhea, nasal congestion  Mouth/Throat: Negative for throat pain or oral lesions  Cardiovascular: Negative for chest pain, palpitations, peripheral edema  Respiratory: Negative for shortness of breath, wheezing or cough  Gastrointestinal: Negative for abdominal pain, dysphagia, vomiting or diarrhea  Skin: Negative for rashes or hives  Allergy/Immunologic: Negative for recent allergic reactions  Hematologic/Lymphatic: Negative for bleeding, bruising, swollen glands  Neurologic: Negative  for headache, dizziness  Psychiatric: Negative for mood changes      Past Medical History:  Past Medical History:   Diagnosis Date    Contusion of foot 06/02/2013    Hypertension     Hypothyroidism     Osteoporosis     Seizure     Stroke           Past Surgical History:  Past Surgical History:   Procedure Laterality Date    CONVERTED PROCEDURE      Craniotomy A-V Malformation Repair Conversion Data     HX TONSILLECTOMY/ADENOIDECTOMY      Tonsillectomy Conversion Data     TONSILLECTOMY          Medications:   Current Outpatient Medications   Medication    beclomethasone (QNASL) 80 MCG/ACT nasal spray    pseudoephedrine (SUDAFED) 30 MG tablet    famotidine (PEPCID) 20 MG tablet    doxycycline hyclate (VIBRA-TABS) 100 MG tablet    solifenacin (VESICARE) 10 MG tablet    RABEprazole (ACIPHEX) 20 MG tablet    famotidine (PEPCID) 40 MG tablet    azelastine (ASTELIN) 0.1 % nasal spray    pseudoephedrine (SUDAFED 12 HOUR) 120 MG 12 hr tablet    triamcinolone (KENALOG) 0.1 % ointment    carBAMazepine (CARBATROL) 100 MG 12 hr capsule    atorvastatin (LIPITOR) 10 MG tablet    fluticasone (FLOVENT DISKUS) 250 MCG/BLIST diskus inhaler    carBAMazepine (TEGRETOL XR) 200 MG 12 hr tablet    levothyroxine (SYNTHROID, LEVOTHROID) 25 MCG  tablet    generic DME    amLODIPine (NORVASC) 2.5 MG tablet    Non-System Medication    Calcium Carb-Cholecalciferol 600-200 MG-UNIT TABS    Denosumab (PROLIA SC)    loratadine (CLARITIN) 10 MG tablet    Multiple Vitamin (MULTIVITAMIN) per tablet    cholecalciferol (VITAMIN D) 1000 UNITS capsule     No current facility-administered medications for this visit.         Adverse drug reactions:   Allergies   Allergen Reactions    Penicillins Hives    Sulfa Antibiotics Other (See Comments)     Serum sickness    Bactrim Other (See Comments)     Serum sickness    Phenytoin Sodium Extended Swelling and Other (See Comments)      Edema    Seasonal Allergies Other (See  Comments)     Trees,pollen,hay fever,mold           Social History:   [name] lives with her husband. No smokers, No pets.     Family History:  Family History   Problem Relation Age of Onset    Aneurysm Other         died of a brain aneursym    81 / Stillbirths Other     Hypertension Mother     Arthritis Mother     Cancer Father     Prostate cancer Father     Other Sister         benign brain lesion     Allergy (severe) Sister     Arthritis Sister     Cancer Other         breast cancer    Cancer Other 11        breast cancer    Cancer Other         breast cancer          Physical Exam:    Gen:The patient looks well. Appropriate affect  HEENT: Normocephalic, atraumatic, extraocular movements intact, No eye swelling or allergic shiners, no nasal drainage, no lip or tongue swelling  Neck: appears supple  Resp: breathing comfortably, Able to take a deep breath, no coughing or sneezing  Skin: No obvious rashes.   Extremities: warm, well-perfused        ASSESSMENT:This is a 63 y.o. woman with allergic rhinitis, intermittent facial pain and penicillin allergy    RECOMMENDATIONS:   We will do a trial of qnasl 1 puff each nostril bid instead of the flonase. We will see if this helps wieth her allergy symptoms and facial pain. If this is helpful, she can try stopping the sudafed. Script was sent    Penicillin allergy as a baby(hives) - penicillin challenge when she feels comfortable coming for an in person visit.    Wheeze- flovent 220 as needed, albuterol as needed  We will reassess as above. . They will let us know if there any problems or concerns meanwhile.   Total time 30 minutes

## 2019-06-25 ENCOUNTER — Ambulatory Visit: Payer: Medicare Other | Admitting: Allergy and Immunology

## 2019-07-01 ENCOUNTER — Ambulatory Visit: Payer: Medicare Other | Attending: Neurology | Admitting: Neurology

## 2019-07-01 DIAGNOSIS — R569 Unspecified convulsions: Secondary | ICD-10-CM | POA: Insufficient documentation

## 2019-07-01 NOTE — Progress Notes (Signed)
Video Visit     Location of Patient: home    Location of Telemedicine Provider: home office    Other participants in telemedicine encounter and roles:  husband    This is an established patient visit.    Reason for visit: seizure disorder    HPI  I the pleasure of seeing her today along with her husband for follow-up of her history of her arteriovenous malformation status post resection and residual seizure disorder.  Overall she has been doing very well despite the ongoing pandemic.  She continues to have significant nasal congestion which has responded reasonably well to Sudafed taken as a high dose in the morning with a second dose midday.  She is apparently about to start a new nasal spray.    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    Unfortunately she has not been able to undergo any home physical therapy due to [place].  She has been doing her own conditioning with walking and weights.  She has had no recurrent seizures.  No new medical concerns.    ROS  Not otherwise contributory    Patient's problem list, allergies, and medications were reviewed and updated as appropriate.  Please see the EHR for full details.    Exam and data reviewed:  She is alert and oriented.  She appears comfortable.  Her speech is clear.  Comprehension is intact.  Her versions appear full with minimal end gaze nystagmus.  Facial strength is symmetric.  She has no significant ataxia.  Her gait is stable with some mild decrease in right arm swing.        Assessment & Plan:  1 impression stable seizure disorder, currently well controlled on Tegretol.  We will hold on any treatments for her essential tremor at this time.  I agreed with the ultimate plan to reduce her Sudafed dose.  I advised her not to stop it abruptly.  I plan on seeing her again in 6 months    Consent was obtained from the patient to complete this video visit; including the potential for financial  liability.          Evalee Mutton, MD

## 2019-07-07 ENCOUNTER — Telehealth: Payer: Self-pay | Admitting: Allergy and Immunology

## 2019-07-07 ENCOUNTER — Ambulatory Visit: Payer: BLUE CROSS/BLUE SHIELD | Admitting: Interventional Cardiology

## 2019-07-07 NOTE — Telephone Encounter (Signed)
Copied from [place] [id number]. Topic: Medications/Prescriptions - Prior Authorization  >> Jul 07, 2019 10:18 AM Doren Custard wrote:  Ms. Benninger called stating that a prior authorization is needed for her beclomethasone (QNASL) 80 MCG/ACT nasal spray. The patient can be reached if necessary at [phone] (home).

## 2019-07-08 ENCOUNTER — Other Ambulatory Visit: Payer: Self-pay | Admitting: Primary Care

## 2019-07-09 ENCOUNTER — Other Ambulatory Visit: Payer: Self-pay | Admitting: Gastroenterology

## 2019-07-09 DIAGNOSIS — R609 Edema, unspecified: Secondary | ICD-10-CM | POA: Diagnosis not present

## 2019-07-09 DIAGNOSIS — M7121 Synovial cyst of popliteal space [Baker], right knee: Secondary | ICD-10-CM | POA: Diagnosis not present

## 2019-07-09 NOTE — Telephone Encounter (Signed)
PA submitted and approved flow sheet updated

## 2019-07-10 ENCOUNTER — Other Ambulatory Visit: Payer: Self-pay | Admitting: Interventional Cardiology

## 2019-07-10 DIAGNOSIS — I10 Essential (primary) hypertension: Secondary | ICD-10-CM

## 2019-07-10 MED ORDER — METOPROLOL SUCCINATE ER 50 MG PO TB24: 50.0000 mg | ORAL_TABLET | Freq: Every day | ORAL | 1 refills | Status: DC

## 2019-07-11 ENCOUNTER — Telehealth: Payer: Self-pay | Admitting: *Deleted

## 2019-07-11 MED ORDER — SPIRONOLACTONE-HCTZ 25-25 MG PO TABS: 0.5000 | ORAL_TABLET | Freq: Every day | ORAL | 0 refills | Status: DC

## 2019-07-11 MED ORDER — METOPROLOL SUCCINATE ER 50 MG PO TB24: 50.0000 mg | ORAL_TABLET | Freq: Every day | ORAL | 0 refills | Status: DC

## 2019-07-11 NOTE — Addendum Note (Signed)
Addended by: Derl Barrow on: 07/11/2019 11:36 AM   Modules accepted: Orders

## 2019-07-11 NOTE — Telephone Encounter (Signed)
Please call pt to schedule appt with Dr. Tamala Julian or APP for cardiac clearance per Roby Lofts, PA. Thanks!

## 2019-07-11 NOTE — Telephone Encounter (Signed)
   Primary Cardiologist:Henry Nicholes Stairs III, MD  Chart reviewed as part of pre-operative protocol coverage. Because of Ariel Simpson's past medical history and time since last visit, she will require a follow-up visit in order to better assess preoperative cardiovascular risk.  Pre-op covering staff: - Please schedule appointment and call patient to inform them. - Please contact requesting surgeon's office via preferred method (i.e, phone, fax) to inform them of need for appointment prior to surgery.   Abigail Butts, PA-C  07/11/2019, 12:28 PM

## 2019-07-11 NOTE — Telephone Encounter (Signed)
   [facility] Pre-operative Risk Assessment    Request for surgical clearance:  1. What type of surgery is being performed? COLONOSCOPY   2. When is this surgery scheduled? 08/26/19   3. What type of clearance is required (medical clearance vs. Pharmacy clearance to hold med vs. Both)? MEDICAL  4. Are there any medications that need to be held prior to surgery and how long? ASA; SURGEON IS ASKING IF PT NEEDS SBE FOR PROCEDURE   5. Practice name and name of physician performing surgery? [facility]; DR. Evette Doffing SCHOOLER   6. What is your office phone number [phone]    7.   What is your office fax number [phone]  8.   Anesthesia type (None, local, MAC, general) ? NOT LISTED. PROPOFOL?    Julaine Hua 07/11/2019, 11:10 AM  _________________________________________________________________   (provider comments below)

## 2019-07-14 NOTE — Telephone Encounter (Signed)
Pt has an appt 10/7, but needs an appt prior to 10/6 for clearance.

## 2019-07-14 NOTE — Telephone Encounter (Signed)
Pt called and rescheduled to 08-22-2019

## 2019-07-14 NOTE — Telephone Encounter (Signed)
Faxed to requesting providers office via Epic fax function 

## 2019-07-21 ENCOUNTER — Telehealth: Payer: Self-pay | Admitting: Primary Care

## 2019-07-21 MED ORDER — DOXYCYCLINE HYCLATE 100 MG PO TABS *I*
100.0000 mg | ORAL_TABLET | Freq: Two times a day (BID) | ORAL | 1 refills | Status: DC
Start: 2019-07-21 — End: 2019-09-24

## 2019-07-21 NOTE — Telephone Encounter (Signed)
Patient calling stating she is having one day of ear fullness and head congestion. and pnd patient is requesting antibiotics. Please advise.

## 2019-07-21 NOTE — Telephone Encounter (Signed)
I sent the script.

## 2019-07-21 NOTE — Telephone Encounter (Signed)
Patient is aware 

## 2019-07-24 ENCOUNTER — Other Ambulatory Visit: Payer: Self-pay | Admitting: Primary Care

## 2019-08-05 ENCOUNTER — Other Ambulatory Visit: Payer: Self-pay | Admitting: Gastroenterology

## 2019-08-05 LAB — HM MAMMOGRAPHY

## 2019-08-06 ENCOUNTER — Other Ambulatory Visit: Payer: Self-pay | Admitting: Primary Care

## 2019-08-06 DIAGNOSIS — I10 Essential (primary) hypertension: Secondary | ICD-10-CM | POA: Diagnosis not present

## 2019-08-06 DIAGNOSIS — I480 Paroxysmal atrial fibrillation: Secondary | ICD-10-CM | POA: Diagnosis not present

## 2019-08-06 DIAGNOSIS — E785 Hyperlipidemia, unspecified: Secondary | ICD-10-CM | POA: Diagnosis not present

## 2019-08-06 DIAGNOSIS — I471 Supraventricular tachycardia: Secondary | ICD-10-CM | POA: Diagnosis not present

## 2019-08-14 ENCOUNTER — Telehealth: Payer: Self-pay | Admitting: Internal Medicine

## 2019-08-14 MED ORDER — AMOXICILLIN 500 MG PO CAPS: ORAL_CAPSULE | ORAL | 0 refills | Status: DC

## 2019-08-14 NOTE — Telephone Encounter (Signed)
LVM letting pt know.  

## 2019-08-14 NOTE — Telephone Encounter (Addendum)
Pt is calling and needs a new rx amoxicillin 500 mg she take 4 capsules before her dental appt. Pt has new dentist and he will not prescribed amoxicillin. Pt has an appt with new dentist 08-18-2019 . Pt has prothesis device in her heart valve. [facility]

## 2019-08-14 NOTE — Telephone Encounter (Signed)
Sent to [facility]

## 2019-08-18 ENCOUNTER — Ambulatory Visit: Payer: Medicare Other

## 2019-08-20 ENCOUNTER — Other Ambulatory Visit: Payer: Self-pay

## 2019-08-20 ENCOUNTER — Encounter (HOSPITAL_COMMUNITY): Payer: Self-pay

## 2019-08-20 NOTE — Progress Notes (Signed)
SPOKE W/  Khara     SCREENING SYMPTOMS OF COVID 19:   COUGH--NO  RUNNY NOSE--- NO  SORE THROAT---NO  NASAL CONGESTION----NO  SNEEZING----NO  SHORTNESS OF BREATH---NO  DIFFICULTY BREATHING---NO  TEMP >100.0 -----NO  UNEXPLAINED BODY ACHES------NO  CHILLS -------- NO  HEADACHES ---------NO  LOSS OF SMELL/ TASTE --------NO    HAVE YOU OR ANY FAMILY MEMBER TRAVELLED PAST 14 DAYS OUT OF THE   COUNTY---NO STATE----NO COUNTRY----NO  HAVE YOU OR ANY FAMILY MEMBER BEEN EXPOSED TO ANYONE WITH COVID 19? NO    

## 2019-08-22 ENCOUNTER — Ambulatory Visit: Payer: BLUE CROSS/BLUE SHIELD | Admitting: Interventional Cardiology

## 2019-08-22 ENCOUNTER — Other Ambulatory Visit (HOSPITAL_COMMUNITY)
Admission: RE | Admit: 2019-08-22 | Discharge: 2019-08-22 | Disposition: A | Discharge status: Home / Self Care | Payer: Medicare Other | Source: Ambulatory Visit | Attending: Gastroenterology | Admitting: Gastroenterology

## 2019-08-22 DIAGNOSIS — Z20828 Contact with and (suspected) exposure to other viral communicable diseases: Secondary | ICD-10-CM | POA: Diagnosis not present

## 2019-08-22 DIAGNOSIS — Z01812 Encounter for preprocedural laboratory examination: Secondary | ICD-10-CM | POA: Insufficient documentation

## 2019-08-22 NOTE — Progress Notes (Signed)
CARDIOLOGY OFFICE NOTE  Date:  08/25/2019    Ariel Simpson Date of Birth: 23-Dec-1955 Medical Record #[id number]  PCP:  Binnie Rail, MD  Cardiologist:  Dr. Daneen Schick  Chief Complaint: follow-up of mitral valve repair and pre-operative evaluation  History of Present Illness: Ariel Simpson is a 63 y.o. female with a history of normal coronary arteries on cardiac catheterization in 2014, mitral valve prolapse with severe mitral valve regurgitation s/p mitral valve repair in 2014, paroxysmal atrial fibrillation s/p Maze procedure at time of MVR, paroxsymal SVT, hypertension, and hyperlipidemia who is followed by Dr. Tamala Julian and presents today for annual follow-up and pre-operative evaluation.   Patient had complex valvuloplasty of mitral valve in 2014 involving triangular resection of posterior leaflet and ring annuloplasty. She was last seen by Dr. Tamala Julian in 03/2018 at which time she was doing well and denied any cardiac symptoms. Echocardiogram was ordered to follow-up on MVR and showed LVEF of 55-60% with normal wall motion. MVR was functioning well with only trivial regurgitation. RV was noted to be severely dilated with moderate tricuspid regurgitation and elevated PASP of 43 mmHg consistent with moderate pulmonary hypertension. Plan was to follow-up in 1 year. She spends a lot of time up [place] and also follows with Cardiologist (Dr. Charisse Klinefelter) in [place]. She last saw Dr. Charisse Klinefelter on 08/06/2019 at which time she reported some lightheadedness after bending over while weeding as well as some palpitations at night but was otherwise doing well. No medication changes were made at that time and plan was to follow-up in 1 year.   Patient presents today for routine follow-up and for pre-operative evaluation for a colonoscopy. Patient doing well overall since last visit. She is staying very active with yoga and yard work. She has noticed some dizziness with standing after being bent over for a  while in her garden. She denies any palpitations, falls, or syncope with this. Dizziness does not last for long. She has never noticed this before this year. However, she does state she has a history of orthostatic hypotension. She does not think it is heat related as she as had a couple of episodes since it has gotten cooler. She tries to make sure she stays very hydrated on days that she is working outside. She also notes that she is very aware of her heart beating at night but denies any racing or skipping beats. She states this has been going on for years but she thinks it may be getting a little worse. She states she has had trouble going to sleep for a while now for which she has PRN Ambien. Otherwise, patient is doing well. No chest pain, shortness of breath, orthopnea, PND, or edema.   Past Medical History:  Diagnosis Date   Arthritis    Benign essential tremor    CHF (congestive heart failure) (HCC)    Heart murmur    High cholesterol    Hypertension    Insomnia    Mitral regurgitation    Mitral valve prolapse    Osteopenia    Paroxysmal atrial fibrillation (HCC) 03/11/2013   Paroxysmal supraventricular tachycardia (HCC)    PONV (postoperative nausea and vomiting)    Dizziness and Nausea   S/P Maze operation for atrial fibrillation 03/18/2013   Complete bilateral atrial lesion set using cryothermy and biopolar radiofrequency ablation with oversewing of LA appendage via right mini thoracotomy   S/P mitral valve repair 03/18/2013   Complex valvuloplasty including triangular  resection of posterior leaflet, artificial Gore-tex neocord placement x4 and 70m Sorin Memo 3D ring annuloplasty via right mini thoracotomy   Severe mitral regurgitation 03/03/2013   Acute on chronic with acute heart failure    Shortness of breath    Wears glasses     Past Surgical History:  Procedure Laterality Date   ABDOMINAL HYSTERECTOMY     hernia repair   CARDIAC CATHETERIZATION      COLONOSCOPY     INTRAOPERATIVE TRANSESOPHAGEAL ECHOCARDIOGRAM N/A 03/18/2013   Procedure: INTRAOPERATIVE TRANSESOPHAGEAL ECHOCARDIOGRAM;  Surgeon: CRexene Alberts MD;  [place] [facility]  Service: Open [place] Surgery;  Laterality: N/A;   LEFT AND RIGHT HEART CATHETERIZATION WITH CORONARY ANGIOGRAM N/A 03/06/2013   Procedure: LEFT AND RIGHT HEART CATHETERIZATION WITH CORONARY ANGIOGRAM;  Surgeon: HSinclair Grooms MD;  [facility];  Service: Cardiovascular;  Laterality: N/A;   MAZE N/A 03/18/2013   Procedure: MAZE;  Surgeon: CRexene Alberts MD;  [facility] [facility]  Service: Open Heart Surgery;  Laterality: N/A;   MITRAL VALVE REPAIR Right 03/18/2013   Procedure: MINIMALLY INVASIVE MITRAL VALVE REPAIR (MVR);  Surgeon: CRexene Alberts MD;  [place] [facility]  Service: Open Heart Surgery;  Laterality: Right;   VARICOSE VEIN SURGERY     WISDOM TOOTH EXTRACTION       Medications: Current Meds  Medication Sig   amoxicillin (AMOXIL) 500 MG capsule TAKE 4 CAPSULES BY MOUTH 1 HOUR PRIOR TO DENTAL APPOINTMENT   aspirin 81 MG tablet Take 1 tablet (81 mg total) by mouth daily.   atorvastatin (LIPITOR) 20 MG tablet TAKE 1 TABLET BY MOUTH DAILY   Calcium Carbonate-Vitamin D (CALCIUM + D PO) Take 2 capsules by mouth daily.   metoprolol succinate (TOPROL-XL) 50 MG 24 hr tablet Take 1 tablet (50 mg total) by mouth daily. Take with or immediately following a meal. Please keep upcoming appt in October. Final Attempt Thank you   Misc Natural Products (GLUCOSAMINE CHOND COMPLEX/MSM PO) Take 1 tablet by mouth daily.   Omega-3 Fatty Acids (FISH OIL PO) Take 2 capsules by mouth daily.   polyethylene glycol (MIRALAX / GLYCOLAX) packet Take 17 g by mouth daily.   spironolactone-hydrochlorothiazide (ALDACTAZIDE) 25-25 MG tablet Take 0.5 tablets by mouth daily. Please keep upcoming appt in October. Final Attempt Thank you   TURMERIC PO Take 1 tablet by mouth daily.    zolpidem (AMBIEN) 5 MG tablet  TAKE 1/2 TABLET BY MOUTH AT NIGHT AS NEEDED FOR SLEEP   [DISCONTINUED] amoxicillin (AMOXIL) 500 MG capsule TAKE 4 CAPSULES BY MOUTH 1 HOUR PRIOR TO DENTAL APPOINTMENT     Allergies: No Known Allergies  Social History: The patient  reports that she has never smoked. She has never used smokeless tobacco. She reports current alcohol use of about 5.0 - 6.0 standard drinks of alcohol per week. She reports that she does not use drugs.   Family History: The patient's family history includes Colon cancer in her paternal grandmother; Down syndrome in her child; Hypertension in her father; Pancreatic cancer in her mother; Seizures in her child; Stomach cancer in her father and paternal grandfather; Thyroid cancer in her sister.   Review of Systems: Please see the history of present illness.   All other systems are reviewed and negative.   Physical Exam: VS:  BP 118/78    Pulse (!) 53    Ht _0  (1.702 m)    Wt 166 lb 12.8 oz (75.7 kg)  SpO2 98%    BMI 26.12 kg/m  .  BMI Body mass index is 26.12 kg/m.  Wt Readings from Last 3 Encounters:  08/25/19 166 lb 12.8 oz (75.7 kg)  09/03/18 165 lb 1.9 oz (74.9 kg)  04/01/18 168 lb 9.6 oz (76.5 kg)   General: 63 y.o. Caucasian female in no acute distress. Pleasant and cooperative. HEENT: Normocephalic and atraumatic. Sclera clear. EOMs intact. Neck: Supple. No carotid bruits. No JVD. Heart: Bradycardic with regular rhythm. Distinct S1 and S2. No significant murmurs, gallops, or rubs. Radial pulses 2+ and equal bilaterally. Lungs: No increased work of breathing. Clear to ausculation bilaterally. No wheezes, rhonchi, or rales.  Abdomen: Soft, non-distended, and non-tender to palpation. Bowel sounds present. MSK: Normal strength and tone for age. Extremities: No clubbing, cyanosis, or edema.    Skin: Warm and dry. Neuro: Alert and oriented x3. No focal deficits. Psych: Normal affect. Responds appropriately.  LABORATORY DATA:  EKG:  EKG ordered  today. This demonstrates Sinus bradycardia, rate 53 bpm, with some T wave flattening in lead aVL (seen on prior tracings) and V2 (new). No other significant changes.   Lab Results  Component Value Date   WBC 5.1 09/03/2018   HGB 13.9 09/03/2018   HCT 40.8 09/03/2018   PLT 207.0 09/03/2018   GLUCOSE 93 09/03/2018   CHOL 153 09/03/2018   TRIG 110.0 09/03/2018   HDL 56.00 09/03/2018   LDLCALC 75 09/03/2018   ALT 25 09/03/2018   AST 21 09/03/2018   NA 140 09/03/2018   K 4.6 09/03/2018   CL 103 09/03/2018   CREATININE 0.94 09/03/2018   BUN 12 09/03/2018   CO2 30 09/03/2018   TSH 2.87 09/03/2018   INR 1.29 03/23/2013   HGBA1C 5.5 02/19/2018     BNP (last 3 results) No results for input(s): BNP in the last 8760 hours.  ProBNP (last 3 results) No results for input(s): PROBNP in the last 8760 hours.   Other Studies Reviewed Today:  Echocardiogram 04/02/2018: Study Conclusions: - Left ventricle: The cavity size was normal. There was mild focal   basal hypertrophy of the septum. Systolic function was normal.   The estimated ejection fraction was in the range of 55% to 60%.   Wall motion was normal; there were no regional wall motion   abnormalities. The study is not technically sufficient to allow   evaluation of LV diastolic function. Doppler parameters are   consistent with high ventricular filling pressure. - Aortic valve: Trileaflet; normal thickness, mildly calcified   leaflets. There was trivial regurgitation. - Mitral valve: S/P MV repair Moderate calcification of the   posterior leaflet. Mean gradient (D): 4 mm Hg. Valve area by   pressure half-time: 3.67 cm^2. - Left atrium: The atrium was mildly dilated. - Right ventricle: The cavity size was severely dilated. Wall   thickness was normal. - Right atrium: The atrium was moderately dilated. - Tricuspid valve: There was moderate regurgitation. - Pulmonic valve: There was mild regurgitation. - Pulmonary arteries: PA  peak pressure: 43 mm Hg (S).  Impressions: - The right ventricular systolic pressure was increased consistent   with moderate pulmonary hypertension.   Assessment/Plan:  Pre-Operative Evaluation - Patient scheduled for colonoscopy tomorrow on 08/26/2019.  - Patient doing well from a cardiac standpoint with no active problems. - Per Revised Cardiac Risk Index, consider low risk procedure with 0.4% chance of MACE. Based on ACC/AHA guidelines, patient at acceptable risk for the planned procedure without further cardiovascular testing. Per  AHA guidelines, no evidence to support SBE prophylaxis for colonoscopy. Discussed this with DOD (Dr. Burt Knack) who also agrees that no SBE prophylaxis is needed. Regarding ASA therapy, we recommend continuation of ASA throughout the perioperative period.  However, if cessation of ASA is required in the perioperative period, it may be stopped 5-7 days prior to surgery with a plan to resume it as soon as able in the post-operative period. Will fax this note to requesting MD. (Dr. Michail Sermon with [facility]).  Dizziness - Patient reports dizziness with dizziness with standing after being bent over for a while in her garden. Dizziness resolves quickly. No chest pain, shortness of breath, palpitations, falls, or syncope with this. She does state she has a history of orthostatic hypotension. - Sounds orthostatic in nature.  - Discussed staying well hydrated and changing positions slowly. - Patient to notify us if symptoms worsens or she has any syncope.  History of Mitral Valve Prolapse with Severe Mitral Regurgitation s/p MVR in 2014 - Most recent Echo from 03/2018 showed LVEF of 55-60% with normal wall motion. MVR was functioning well with only trivial regurgitation. RV was noted to be severely dilated with moderate tricuspid regurgitation and elevated PASP of 43 mmHg consistent with moderate pulmonary hypertension. - Continue SBE prophylaxis prior to dental appointments.  Not needed prior to coloscopy.  Paroxysmal Atrial Fibrillation s/p Maze in 2014 / History of Paroxsymal SVT - EKG today shows sinus bradycardia, rate 53 bpm, with some T wave flattening in V2 but no other changes from prior tracings. - Patient very aware of heart beat at night but no palpitations.  - Continue Toprol-XL 657m daily. - No recurrence of atrial fibrillation since MAZE procedure so not on anticoagulation.  Hypertension - BP well controlled at 118/78.  - Continue home medications: Toprol-XL 5257mdaily and Spirolactone half tablet (12.5-12.57m357mdaily. - Will check renal function and potassium with CMET today.  Hyperlipidemia - Last LDL 75 in 08/2018. - Continue Lipitor 61m23mily and Omega-3 daily.  - Will recheck lipid panel and CMET today.   Patient going to see PCP in the next couple of weeks. It looks like she usually has CBC checked at annual visit so will go ahead and check today with other labs.  Current medicines are reviewed with the patient today.  The patient does not have concerns regarding medicines other than what has been noted above.  The following changes have been made:  See above.  Labs/ tests ordered today include:    Orders Placed This Encounter  Procedures   Comp Met (CMET)   CBC w/Diff   Lipid Profile     Disposition:  Follow-up with Dr. SmitTamala Julian1 year.  Patient is agreeable to this plan and will call if any problems develop in the interim.   Signed: CallDarreld Mclean-C  08/25/2019 12:23 PM  [facility]  [phone]: [phone]

## 2019-08-24 LAB — NOVEL CORONAVIRUS, NAA (HOSP ORDER, SEND-OUT TO REF LAB; TAT 18-24 HRS): SARS-CoV-2, NAA: NOT DETECTED

## 2019-08-25 ENCOUNTER — Other Ambulatory Visit: Payer: Self-pay | Admitting: Gastroenterology

## 2019-08-25 ENCOUNTER — Encounter: Payer: Self-pay | Admitting: Student

## 2019-08-25 ENCOUNTER — Ambulatory Visit: Payer: Medicare Other | Admitting: Student

## 2019-08-25 ENCOUNTER — Other Ambulatory Visit: Payer: Self-pay

## 2019-08-25 VITALS — BP 118/78 | HR 53 | Ht 67.0 in | Wt 166.8 lb

## 2019-08-25 DIAGNOSIS — Z9889 Other specified postprocedural states: Secondary | ICD-10-CM

## 2019-08-25 DIAGNOSIS — I10 Essential (primary) hypertension: Secondary | ICD-10-CM | POA: Diagnosis not present

## 2019-08-25 DIAGNOSIS — R42 Dizziness and giddiness: Secondary | ICD-10-CM | POA: Diagnosis not present

## 2019-08-25 DIAGNOSIS — I480 Paroxysmal atrial fibrillation: Secondary | ICD-10-CM | POA: Diagnosis not present

## 2019-08-25 DIAGNOSIS — E7849 Other hyperlipidemia: Secondary | ICD-10-CM | POA: Diagnosis not present

## 2019-08-25 DIAGNOSIS — Z01818 Encounter for other preprocedural examination: Secondary | ICD-10-CM

## 2019-08-25 DIAGNOSIS — Z8679 Personal history of other diseases of the circulatory system: Secondary | ICD-10-CM

## 2019-08-25 LAB — CBC WITH DIFFERENTIAL/PLATELET
Basophils Absolute: 0 10*3/uL (ref 0.0–0.2)
Basos: 1 %
EOS (ABSOLUTE): 0.1 10*3/uL (ref 0.0–0.4)
Eos: 2 %
Hematocrit: 39.1 % (ref 34.0–46.6)
Hemoglobin: 13.4 g/dL (ref 11.1–15.9)
Immature Grans (Abs): 0 10*3/uL (ref 0.0–0.1)
Immature Granulocytes: 0 %
Lymphocytes Absolute: 1.9 10*3/uL (ref 0.7–3.1)
Lymphs: 42 %
MCH: 31.5 pg (ref 26.6–33.0)
MCHC: 34.3 g/dL (ref 31.5–35.7)
MCV: 92 fL (ref 79–97)
Monocytes Absolute: 0.4 10*3/uL (ref 0.1–0.9)
Monocytes: 10 %
Neutrophils Absolute: 2 10*3/uL (ref 1.4–7.0)
Neutrophils: 45 %
Platelets: 216 10*3/uL (ref 150–450)
RBC: 4.26 x10E6/uL (ref 3.77–5.28)
RDW: 11.9 % (ref 11.7–15.4)
WBC: 4.5 10*3/uL (ref 3.4–10.8)

## 2019-08-25 LAB — COMPREHENSIVE METABOLIC PANEL
ALT: 17 IU/L (ref 0–32)
AST: 21 IU/L (ref 0–40)
Albumin/Globulin Ratio: 2 (ref 1.2–2.2)
Albumin: 4.5 g/dL (ref 3.8–4.8)
Alkaline Phosphatase: 74 IU/L (ref 39–117)
BUN/Creatinine Ratio: 15 (ref 12–28)
BUN: 14 mg/dL (ref 8–27)
Bilirubin Total: 0.6 mg/dL (ref 0.0–1.2)
CO2: 26 mmol/L (ref 20–29)
Calcium: 10.7 mg/dL — ABNORMAL HIGH (ref 8.7–10.3)
Chloride: 101 mmol/L (ref 96–106)
Creatinine, Ser: 0.94 mg/dL (ref 0.57–1.00)
GFR calc Af Amer: 74 mL/min/{1.73_m2} (ref >59)
GFR calc non Af Amer: 64 mL/min/{1.73_m2} (ref >59)
Globulin, Total: 2.2 g/dL (ref 1.5–4.5)
Glucose: 84 mg/dL (ref 65–99)
Potassium: 4.6 mmol/L (ref 3.5–5.2)
Sodium: 141 mmol/L (ref 134–144)
Total Protein: 6.7 g/dL (ref 6.0–8.5)

## 2019-08-25 LAB — LIPID PANEL
Chol/HDL Ratio: 2.7 ratio (ref 0.0–4.4)
Cholesterol, Total: 176 mg/dL (ref 100–199)
HDL: 66 mg/dL (ref >39)
LDL Chol Calc (NIH): 95 mg/dL (ref 0–99)
Triglycerides: 78 mg/dL (ref 0–149)
VLDL Cholesterol Cal: 15 mg/dL (ref 5–40)

## 2019-08-25 MED ORDER — AMOXICILLIN 500 MG PO CAPS: ORAL_CAPSULE | ORAL | 3 refills | Status: DC

## 2019-08-25 NOTE — Patient Instructions (Addendum)
Medication Instructions:  Your physician recommends that you continue on your current medications as directed. Please refer to the Current Medication list given to you today. 1. Amoxicillin has been sent in today to take 4 tablets, 1 hour prior to dental procedures.    Labwork: Your physician recommends that you have lab work today/ Cmet/Lipid/Cbc.   Testing/Procedures: Patient has been cleared for surgery with [facility] for colonoscopy. Sande Rives, PA will send message to Dr. Michail Sermon  Follow-Up: Your physician wants you to follow-up in: 1 year with Dr. Tamala Julian.  You will receive a reminder letter in the mail two months in advance. If you don't receive a letter, please call our office to schedule the follow-up appointment.   Any Other Special Instructions Will Be Listed Below (If Applicable).     If you need a refill on your cardiac medications before your next appointment, please call your pharmacy.

## 2019-08-25 NOTE — Progress Notes (Signed)
Pre call done . Pt confirmed that she was able to quarantine and has not had any fever or flu like symptoms. No questions from patient

## 2019-08-25 NOTE — Anesthesia Preprocedure Evaluation (Addendum)
Anesthesia Evaluation  Patient identified by MRN, date of birth, ID band Patient awake    Reviewed: Allergy & Precautions, NPO status , Patient's Chart, lab work & pertinent test results  History of Anesthesia Complications (+) PONVNegative for: history of anesthetic complications  Airway Mallampati: II  TM Distance: >3 FB Neck ROM: Full    Dental  (+) Teeth Intact   Pulmonary neg pulmonary ROS,    Pulmonary exam normal        Cardiovascular hypertension, +CHF  Normal cardiovascular exam+ dysrhythmias Atrial Fibrillation and Supra Ventricular Tachycardia + Valvular Problems/Murmurs (s/p MVR) MR      Neuro/Psych negative neurological ROS  negative psych ROS   GI/Hepatic negative GI ROS, Neg liver ROS,   Endo/Other  negative endocrine ROS  Renal/GU negative Renal ROS  negative genitourinary   Musculoskeletal negative musculoskeletal ROS (+)   Abdominal   Peds  Hematology negative hematology ROS (+)   Anesthesia Other Findings Echo 2019: EF 55-60%, s/p MVR, no stenosis or regurgitation, PASP 43  Reproductive/Obstetrics                           Anesthesia Physical Anesthesia Plan  ASA: III  Anesthesia Plan: MAC   Post-op Pain Management:    Induction: Intravenous  PONV Risk Score and Plan: 3 and Propofol infusion, TIVA and Treatment may vary due to age or medical condition  Airway Management Planned: Natural Airway, Nasal Cannula and Simple Face Mask  Additional Equipment: None  Intra-op Plan:   Post-operative Plan:   Informed Consent: I have reviewed the patients History and Physical, chart, labs and discussed the procedure including the risks, benefits and alternatives for the proposed anesthesia with the patient or authorized representative who has indicated his/her understanding and acceptance.       Plan Discussed with:   Anesthesia Plan Comments:         Anesthesia Quick Evaluation

## 2019-08-26 ENCOUNTER — Ambulatory Visit (HOSPITAL_COMMUNITY)
Admission: RE | Admit: 2019-08-26 | Discharge: 2019-08-26 | Disposition: A | Discharge status: Home / Self Care | Payer: Medicare Other | Attending: Gastroenterology | Admitting: Gastroenterology

## 2019-08-26 ENCOUNTER — Ambulatory Visit (HOSPITAL_COMMUNITY): Payer: Medicare Other | Admitting: Anesthesiology

## 2019-08-26 ENCOUNTER — Telehealth: Payer: Self-pay

## 2019-08-26 ENCOUNTER — Encounter (HOSPITAL_COMMUNITY)
Admission: RE | Disposition: A | Discharge status: Home / Self Care | Payer: Self-pay | Source: Home / Self Care | Attending: Gastroenterology

## 2019-08-26 ENCOUNTER — Encounter (HOSPITAL_COMMUNITY): Payer: Self-pay | Admitting: *Deleted

## 2019-08-26 DIAGNOSIS — I480 Paroxysmal atrial fibrillation: Secondary | ICD-10-CM | POA: Diagnosis not present

## 2019-08-26 DIAGNOSIS — K635 Polyp of colon: Secondary | ICD-10-CM | POA: Insufficient documentation

## 2019-08-26 DIAGNOSIS — E785 Hyperlipidemia, unspecified: Secondary | ICD-10-CM | POA: Insufficient documentation

## 2019-08-26 DIAGNOSIS — Z1211 Encounter for screening for malignant neoplasm of colon: Secondary | ICD-10-CM | POA: Insufficient documentation

## 2019-08-26 DIAGNOSIS — Z860100 Personal history of colon polyps, unspecified: Secondary | ICD-10-CM

## 2019-08-26 DIAGNOSIS — I110 Hypertensive heart disease with heart failure: Secondary | ICD-10-CM | POA: Diagnosis not present

## 2019-08-26 DIAGNOSIS — K640 First degree hemorrhoids: Secondary | ICD-10-CM | POA: Diagnosis not present

## 2019-08-26 DIAGNOSIS — Z79899 Other long term (current) drug therapy: Secondary | ICD-10-CM | POA: Insufficient documentation

## 2019-08-26 DIAGNOSIS — Z7982 Long term (current) use of aspirin: Secondary | ICD-10-CM | POA: Diagnosis not present

## 2019-08-26 DIAGNOSIS — D125 Benign neoplasm of sigmoid colon: Secondary | ICD-10-CM | POA: Diagnosis not present

## 2019-08-26 HISTORY — PX: BIOPSY: SHX5522

## 2019-08-26 HISTORY — DX: Presence of spectacles and contact lenses: Z97.3

## 2019-08-26 HISTORY — DX: Insomnia, unspecified: G47.00

## 2019-08-26 HISTORY — DX: Essential tremor: G25.0

## 2019-08-26 HISTORY — PX: COLONOSCOPY WITH PROPOFOL: SHX5780

## 2019-08-26 HISTORY — DX: Other specified disorders of bone density and structure, unspecified site: M85.80

## 2019-08-26 HISTORY — DX: Unspecified osteoarthritis, unspecified site: M19.90

## 2019-08-26 SURGERY — COLONOSCOPY WITH PROPOFOL
Anesthesia: Monitor Anesthesia Care

## 2019-08-26 MED ORDER — LACTATED RINGERS IV SOLN
INTRAVENOUS | Status: AC | PRN
  Administered 2019-08-26: 1000 mL via INTRAVENOUS

## 2019-08-26 MED ORDER — SODIUM CHLORIDE 0.9 % IV SOLN: INTRAVENOUS | Status: DC

## 2019-08-26 MED ORDER — PROPOFOL 10 MG/ML IV BOLUS
INTRAVENOUS | Status: AC
  Filled 2019-08-26: qty 40

## 2019-08-26 MED ORDER — PROPOFOL 500 MG/50ML IV EMUL
INTRAVENOUS | Status: DC | PRN
  Administered 2019-08-26: 40 mg via INTRAVENOUS

## 2019-08-26 MED ORDER — PROPOFOL 500 MG/50ML IV EMUL
INTRAVENOUS | Status: DC | PRN
  Administered 2019-08-26: 125 ug/kg/min via INTRAVENOUS

## 2019-08-26 SURGICAL SUPPLY — 22 items

## 2019-08-26 NOTE — Discharge Instructions (Signed)
YOU HAD AN ENDOSCOPIC PROCEDURE TODAY: Refer to the procedure report and other information in the discharge instructions given to you for any specific questions about what was found during the examination. If this information does not answer your questions, please call [facility] office at [phone] to clarify.  ? ?YOU SHOULD EXPECT: Some feelings of bloating in the abdomen. Passage of more gas than usual. Walking can help get rid of the air that was put into your GI tract during the procedure and reduce the bloating. If you had a lower endoscopy (such as a colonoscopy or flexible sigmoidoscopy) you may notice spotting of blood in your stool or on the toilet paper. Some abdominal soreness may be present for a day or two, also. ? ?DIET: Your first meal following the procedure should be a light meal and then it is ok to progress to your normal diet. A half-sandwich or bowl of soup is an example of a good first meal. Heavy or fried foods are harder to digest and may make you feel nauseous or bloated. Drink plenty of fluids but you should avoid alcoholic beverages for 24 hours.  ? ?ACTIVITY: Your care partner should take you home directly after the procedure. You should plan to take it easy, moving slowly for the rest of the day. You can resume normal activity the day after the procedure however YOU SHOULD NOT DRIVE, use power tools, machinery or perform tasks that involve climbing or major physical exertion for 24 hours (because of the sedation medicines used during the test).  ? ?SYMPTOMS TO REPORT IMMEDIATELY: ?A gastroenterologist can be reached at any hour. Please call [phone]  for any of the following symptoms:  ?Following lower endoscopy (colonoscopy, flexible sigmoidoscopy) ?Excessive amounts of blood in the stool  ?Significant tenderness, worsening of abdominal pains  ?Swelling of the abdomen that is new, acute  ?Fever of 100? or higher  ? ?FOLLOW UP:  ?If any biopsies were taken you will be contacted by  phone or by letter within the next 1-3 weeks. Call [phone]  if you have not heard about the biopsies in 3 weeks.  ?Please also call with any specific questions about appointments or follow up tests.  ?

## 2019-08-26 NOTE — Anesthesia Procedure Notes (Signed)
Date/Time: 08/26/2019 8:08 AM Performed by: Glory Buff, CRNA Oxygen Delivery Method: Simple face mask

## 2019-08-26 NOTE — Telephone Encounter (Signed)
Pt advised lab results and she reports that she would like to discuss further with Dr. Quay Burow her PCP at her appt next week and will call us to let us know what she decides to do in regards to her meds.

## 2019-08-26 NOTE — Telephone Encounter (Signed)
Noted, will discuss at her appt.

## 2019-08-26 NOTE — Op Note (Signed)
[facility] Patient Name: Ariel Simpson Procedure Date: 08/26/2019 MRN: [id number] Attending MD: [name] , MD Date of Birth: 08/17/1956 CSN: [id number] Age: 63 Admit Type: Outpatient Procedure:                Colonoscopy Indications:              High risk colon cancer surveillance: Personal                            history of colonic polyps, Last colonoscopy: June                            2015 Providers:                [name], MD, Cleda Daub, RN, Marguerita Merles, Technician, Lazaro Arms, Technician Referring MD:             Sammuel Bailiff. Nancy Fetter, MD Medicines:                Propofol per Anesthesia, Monitored Anesthesia Care Complications:            No immediate complications. Estimated Blood Loss:     Estimated blood loss was minimal. Procedure:                Pre-Anesthesia Assessment:                           - Prior to the procedure, a History and Physical                            was performed, and patient medications and                            allergies were reviewed. The patient's tolerance of                            previous anesthesia was also reviewed. The risks                            and benefits of the procedure and the sedation                            options and risks were discussed with the patient.                            All questions were answered, and informed consent                            was obtained. Prior Anticoagulants: The patient has                            taken no previous anticoagulant or antiplatelet  agents. ASA Grade Assessment: III - A patient with                            severe systemic disease. After reviewing the risks                            and benefits, the patient was deemed in                            satisfactory condition to undergo the procedure.                           After obtaining informed consent, the  colonoscope                            was passed under direct vision. Throughout the                            procedure, the patient's blood pressure, pulse, and                            oxygen saturations were monitored continuously. The                            [id number]) Olympus pediatric colonscope                            was introduced through the anus and advanced to the                            the cecum, identified by appendiceal orifice and                            ileocecal valve. The colonoscopy was somewhat                            difficult due to significant looping and a tortuous                            colon. Successful completion of the procedure was                            aided by straightening and shortening the scope to                            obtain bowel loop reduction, using scope torsion                            and applying abdominal pressure. The patient                            tolerated the procedure well. The quality of the  bowel preparation was fair and fair but repeated                            irrigation led to a good and adequate prep. The                            terminal ileum, ileocecal valve, appendiceal                            orifice, and rectum were photographed. Scope In: 8:19:09 AM Scope Out: 8:36:45 AM Scope Withdrawal Time: 0 hours 8 minutes 56 seconds  Total Procedure Duration: 0 hours 17 minutes 36 seconds  Findings:      The perianal and digital rectal examinations were normal.      Three flat polyps were found in the sigmoid colon. The polyps were 1 to       2 mm in size. These polyps were removed with a cold biopsy forceps.       Resection and retrieval were complete. Estimated blood loss was minimal.      Internal hemorrhoids were found during retroflexion. The hemorrhoids       were small and Grade I (internal hemorrhoids that do not prolapse).      The terminal  ileum appeared normal. Impression:               - Preparation of the colon was fair.                           - Three 1 to 2 mm polyps in the sigmoid colon,                            removed with a cold biopsy forceps. Resected and                            retrieved.                           - Internal hemorrhoids.                           - The examined portion of the ileum was normal. Moderate Sedation:      Not Applicable - Patient had care per Anesthesia. Recommendation:           - Await pathology results.                           - Patient has a contact number available for                            emergencies. The signs and symptoms of potential                            delayed complications were discussed with the                            patient. Return to normal activities tomorrow.  Written discharge instructions were provided to the                            patient.                           - High fiber diet.                           - Repeat colonoscopy for surveillance based on                            pathology results.                           - Continue present medications. Procedure Code(s):        --- Professional ---                           [phone], Colonoscopy, flexible; with biopsy, single                            or multiple Diagnosis Code(s):        --- Professional ---                           Z86.010, Personal history of colonic polyps                           K63.5, Polyp of colon                           K64.0, First degree hemorrhoids CPT copyright 2019 American [employer]. All rights reserved. The codes documented in this report are preliminary and upon coder review may  be revised to meet current compliance requirements. [name], MD 08/26/2019 8:44:54 AM This report has been signed electronically. Number of Addenda: 0

## 2019-08-26 NOTE — Anesthesia Postprocedure Evaluation (Signed)
Anesthesia Post Note  Patient: Ariel Simpson  Procedure(s) Performed: COLONOSCOPY WITH PROPOFOL (N/A ) BIOPSY     Patient location during evaluation: Endoscopy Anesthesia Type: MAC Level of consciousness: awake and alert Pain management: pain level controlled Vital Signs Assessment: post-procedure vital signs reviewed and stable Respiratory status: spontaneous breathing, nonlabored ventilation and respiratory function stable Cardiovascular status: blood pressure returned to baseline and stable Postop Assessment: no apparent nausea or vomiting Anesthetic complications: no    Last Vitals:  Vitals:   08/26/19 0846 08/26/19 0850  BP: 116/69 125/74  Pulse: (!) 48 (!) 49  Resp: 13 13  Temp: (!) 36.4 C   SpO2: 100% 100%    Last Pain:  Vitals:   08/26/19 0846  TempSrc: Oral  PainSc: 0-No pain                 Lidia Collum

## 2019-08-26 NOTE — H&P (Addendum)
Date of Initial H&P: 08/19/19  History reviewed, patient examined, no change in status, stable for surgery.  

## 2019-08-26 NOTE — Telephone Encounter (Signed)
-----   Message from Darreld Mclean, [place] sent at 08/26/2019  7:32 AM EDT ----- Please notify patient of results: LDL (bad cholesterol) is slightly higher than it was last year. Per Dr. Thompson Caul last note, LDL goal is <70. Therefore, can increase Lipitor to 40mg  daily if patient is okay with that. Will then need repeat fasting lipid panel and LFTs in 6-8 weeks. Calcium level is also a little high. She should follow-up with her PCP for this. I think she has a visit with her PCP in the next couple of weeks but let's send a copy of the labs to her PCP so they are aware. Otherwise, labs look good.  Thank you!

## 2019-08-26 NOTE — Interval H&P Note (Signed)
History and Physical Interval Note:  08/26/2019 8:07 AM  Ariel Simpson  has presented today for surgery, with the diagnosis of History of colon polyps.  The various methods of treatment have been discussed with the patient and family. After consideration of risks, benefits and other options for treatment, the patient has consented to  Procedure(s): COLONOSCOPY WITH PROPOFOL (N/A) as a surgical intervention.  The patient's history has been reviewed, patient examined, no change in status, stable for surgery.  I have reviewed the patient's chart and labs.  Questions were answered to the patient's satisfaction.     [name]

## 2019-08-26 NOTE — Transfer of Care (Signed)
Immediate Anesthesia Transfer of Care Note  Patient: Ariel Simpson  Procedure(s) Performed: COLONOSCOPY WITH PROPOFOL (N/A ) BIOPSY  Patient Location: PACU  Anesthesia Type:MAC  Level of Consciousness: drowsy, patient cooperative and responds to stimulation  Airway & Oxygen Therapy: Patient Spontanous Breathing and Patient connected to face mask oxygen  Post-op Assessment: Report given to RN and Post -op Vital signs reviewed and stable  Post vital signs: Reviewed and stable  Last Vitals:  Vitals Value Taken Time  BP    Temp    Pulse 47 08/26/19 0846  Resp 13 08/26/19 0846  SpO2 100 % 08/26/19 0846  Vitals shown include unvalidated device data.  Last Pain:  Vitals:   08/26/19 0709  TempSrc: Oral  PainSc: 0-No pain         Complications: No apparent anesthesia complications

## 2019-08-27 ENCOUNTER — Ambulatory Visit: Payer: BLUE CROSS/BLUE SHIELD | Admitting: Interventional Cardiology

## 2019-08-27 ENCOUNTER — Encounter (HOSPITAL_COMMUNITY): Payer: Self-pay | Admitting: Gastroenterology

## 2019-08-27 LAB — SURGICAL PATHOLOGY

## 2019-08-28 ENCOUNTER — Ambulatory Visit: Payer: Medicare Other | Attending: Primary Care

## 2019-08-28 DIAGNOSIS — Z23 Encounter for immunization: Secondary | ICD-10-CM

## 2019-08-29 ENCOUNTER — Telehealth: Payer: Self-pay | Admitting: Primary Care

## 2019-08-29 NOTE — Telephone Encounter (Signed)
Pt aware.

## 2019-08-29 NOTE — Telephone Encounter (Signed)
Got 1/2 dose of flu shot last night.  Now with body aches and fatigue.  Has not happened to her in past.  First time for high dose vaccine though.  Normal?

## 2019-08-29 NOTE — Telephone Encounter (Signed)
Yes, this is a normal response.  She can take Tylenol.  Should resolve in a couple of days at the most.  If temperature goes up above 100, she should let us know.

## 2019-09-01 ENCOUNTER — Ambulatory Visit: Payer: Medicare Other

## 2019-09-03 ENCOUNTER — Telehealth: Payer: Self-pay | Admitting: Primary Care

## 2019-09-03 NOTE — Telephone Encounter (Signed)
Had 1/2 dose high dose flu shot last week and had severe muscle aches .  Wonders if she can do anything to prevent that next week when she gets the second 1/2

## 2019-09-04 NOTE — Telephone Encounter (Signed)
Told her husband that she could take tylenol

## 2019-09-04 NOTE — Patient Instructions (Addendum)
Ariel Simpson , Thank you for taking time to come for your Medicare Wellness Visit. I appreciate your ongoing commitment to your health goals. Please review the following plan we discussed and let me know if I can assist you in the future.   These are the goals we discussed: Goals   None     This is a list of the screening recommended for you and due dates:  Health Maintenance  Topic Date Due  . Pneumonia vaccines (1 of 2 - PCV13) 09/04/2020*  . DEXA scan (bone density measurement)  10/17/2019  . Mammogram  02/02/2021  . Tetanus Vaccine  04/08/2022  . Colon Cancer Screening  08/25/2024  . Flu Shot  Completed  .  Hepatitis C: One time screening is recommended by Center for Disease Control  (CDC) for  adults born from [birth year] through 1967.   Completed  . HIV Screening  Completed  *Topic was postponed. The date shown is not the original due date.    Tests ordered today. Your results will be released to [place] (or called to you) after review.  If any changes need to be made, you will be notified at that same time.  All other Health Maintenance issues reviewed.   All recommended immunizations and age-appropriate screenings are up-to-date or discussed.  Flu immunization administered today.    Medications reviewed and updated.  Changes include :   none   Please followup in 1 year    Health Maintenance, Female Adopting a healthy lifestyle and getting preventive care are important in promoting health and wellness. Ask your health care provider about:  The right schedule for you to have regular tests and exams.  Things you can do on your own to prevent diseases and keep yourself healthy. What should I know about diet, weight, and exercise? Eat a healthy diet   Eat a diet that includes plenty of vegetables, fruits, low-fat dairy products, and lean protein.  Do not eat a lot of foods that are high in solid fats, added sugars, or sodium. Maintain a healthy weight Body mass index  (BMI) is used to identify weight problems. It estimates body fat based on height and weight. Your health care provider can help determine your BMI and help you achieve or maintain a healthy weight. Get regular exercise Get regular exercise. This is one of the most important things you can do for your health. Most adults should:  Exercise for at least 150 minutes each week. The exercise should increase your heart rate and make you sweat (moderate-intensity exercise).  Do strengthening exercises at least twice a week. This is in addition to the moderate-intensity exercise.  Spend less time sitting. Even light physical activity can be beneficial. Watch cholesterol and blood lipids Have your blood tested for lipids and cholesterol at 65 years of age, then have this test every 5 years. Have your cholesterol levels checked more often if:  Your lipid or cholesterol levels are high.  You are older than 65 years of age.  You are at high risk for heart disease. What should I know about cancer screening? Depending on your health history and family history, you may need to have cancer screening at various ages. This may include screening for:  Breast cancer.  Cervical cancer.  Colorectal cancer.  Skin cancer.  Lung cancer. What should I know about heart disease, diabetes, and high blood pressure? Blood pressure and heart disease  High blood pressure causes heart disease and increases the risk of  stroke. This is more likely to develop in people who have high blood pressure readings, are of African descent, or are overweight.  Have your blood pressure checked: ? Every 3-5 years if you are 69-7 years of age. ? Every year if you are 70 years old or older. Diabetes Have regular diabetes screenings. This checks your fasting blood sugar level. Have the screening done:  Once every three years after age 1 if you are at a normal weight and have a low risk for diabetes.  More often and at a  younger age if you are overweight or have a high risk for diabetes. What should I know about preventing infection? Hepatitis B If you have a higher risk for hepatitis B, you should be screened for this virus. Talk with your health care provider to find out if you are at risk for hepatitis B infection. Hepatitis C Testing is recommended for:  Everyone born from [birth year] through 1967.  Anyone with known risk factors for hepatitis C. Sexually transmitted infections (STIs)  Get screened for STIs, including gonorrhea and chlamydia, if: ? You are sexually active and are younger than 65 years of age. ? You are older than 65 years of age and your health care provider tells you that you are at risk for this type of infection. ? Your sexual activity has changed since you were last screened, and you are at increased risk for chlamydia or gonorrhea. Ask your health care provider if you are at risk.  Ask your health care provider about whether you are at high risk for HIV. Your health care provider may recommend a prescription medicine to help prevent HIV infection. If you choose to take medicine to prevent HIV, you should first get tested for HIV. You should then be tested every 3 months for as long as you are taking the medicine. Pregnancy  If you are about to stop having your period (premenopausal) and you may become pregnant, seek counseling before you get pregnant.  Take 400 to 800 micrograms (mcg) of folic acid every day if you become pregnant.  Ask for birth control (contraception) if you want to prevent pregnancy. Osteoporosis and menopause Osteoporosis is a disease in which the bones lose minerals and strength with aging. This can result in bone fractures. If you are 2 years old or older, or if you are at risk for osteoporosis and fractures, ask your health care provider if you should:  Be screened for bone loss.  Take a calcium or vitamin D supplement to lower your risk of fractures.  Be  given hormone replacement therapy (HRT) to treat symptoms of menopause. Follow these instructions at home: Lifestyle  Do not use any products that contain nicotine or tobacco, such as cigarettes, e-cigarettes, and chewing tobacco. If you need help quitting, ask your health care provider.  Do not use street drugs.  Do not share needles.  Ask your health care provider for help if you need support or information about quitting drugs. Alcohol use  Do not drink alcohol if: ? Your health care provider tells you not to drink. ? You are pregnant, may be pregnant, or are planning to become pregnant.  If you drink alcohol: ? Limit how much you use to 0-1 drink a day. ? Limit intake if you are breastfeeding.  Be aware of how much alcohol is in your drink. In the [place] one drink equals one 12 oz bottle of beer (355 mL), one 5 oz glass of wine (148  mL), or one 1 oz glass of hard liquor (44 mL). General instructions  Schedule regular health, dental, and eye exams.  Stay current with your vaccines.  Tell your health care provider if: ? You often feel depressed. ? You have ever been abused or do not feel safe at home. Summary  Adopting a healthy lifestyle and getting preventive care are important in promoting health and wellness.  Follow your health care provider's instructions about healthy diet, exercising, and getting tested or screened for diseases.  Follow your health care provider's instructions on monitoring your cholesterol and blood pressure. This information is not intended to replace advice given to you by your health care provider. Make sure you discuss any questions you have with your health care provider. Document Released: 05/22/2011 Document Revised: 10/30/2018 Document Reviewed: 10/30/2018 Elsevier Patient Education  2020 [employer].

## 2019-09-04 NOTE — Progress Notes (Signed)
Subjective:    Patient ID: Ariel Simpson, female    DOB: [birth year], 63 y.o.   MRN: [id number]  HPI Here for welcome to medicare wellness exam and an annual physical exam.   I have personally reviewed and have noted 1.         The patient's medical and social history 2.         Their use of alcohol, tobacco or illicit drugs 3.         Their current medications and supplements 4.         The patient's functional ability including ADL's, fall risks, home safety risk and hearing or visual impairment. 5.         Diet and physical activities 6.         Evidence for depression or mood disorders 7.         Care team reviewed  - Cariology - Dr Daneen Schick, Neuro - Dr Carles Collet, Eye doctor    Are there smokers in your home (other than you)? No  Risk Factors Exercise: zoom yoga Dietary issues discussed: overall balanced, lots of veges, low fat  Vitamin and supplement use:  Calcium and vitamin, fish oil, tumeric  Opiod use:   N/A Side effects from medication: Does medications benefits outweigh risks/side effects:   Cardiac risk factors: advanced age, hyperlipidemia  Depression Screen  Have you felt down, depressed or hopeless? No  Have you felt little interest or pleasure in doing things?  No  Activities of Daily Living In your present state of health, do you have any difficulty performing the following activities?:  Driving? No Managing money?  No Feeding yourself? No Getting from bed to chair? No Climbing a flight of stairs? No Preparing food and eating?: No Bathing or showering? No Getting dressed: No Getting to/using the toilet? No Moving around from place to place: No In the past year have you fallen or had a near fall?: No   Do you have more than one partner?  No  Hearing Difficulties: No Do you often ask people to speak up or repeat themselves? No Do you experience ringing or noises in your ears? Yes  Do you have difficulty understanding soft or whispered voices?  maybe  Vision:              Any change in vision:  no             Up to date with eye exam:  yes   Memory:  Do you feel that you have a problem with memory? No  Do you often misplace items? No  Do you feel safe at home?  Yes  Cognitive Testing  Alert, Orientated? Yes  Normal Appearance? Yes  Recall of three objects?  Yes  Can perform simple calculations? Yes  Displays appropriate judgment? Yes  Can read the correct time from a watch face? Yes   Advanced Directives have been discussed with the patient? Yes, in place     Medications and allergies reviewed with patient and updated if appropriate.  Patient Active Problem List   Diagnosis Date Noted  . Personal history of colonic polyps 08/26/2019  . Osteopenia 09/23/2016  . Need for prophylactic antibiotic 09/23/2016  . Tremor, benign 08/08/2016  . Hyperlipidemia 08/08/2016  . Insomnia 08/08/2016  . S/P mitral valve repair 03/18/2013  . S/P Maze operation for atrial fibrillation 03/18/2013  . Paroxysmal atrial fibrillation ([place]) 03/11/2013  . History of PSVT (paroxysmal supraventricular tachycardia)  03/03/2013    Class: Chronic    Current Outpatient Medications on File Prior to Visit  Medication Sig Dispense Refill  . amoxicillin (AMOXIL) 500 MG capsule TAKE 4 CAPSULES BY MOUTH 1 HOUR PRIOR TO DENTAL APPOINTMENT 4 capsule 3  . aspirin 81 MG tablet Take 1 tablet (81 mg total) by mouth daily.    Marland Kitchen atorvastatin (LIPITOR) 20 MG tablet TAKE 1 TABLET BY MOUTH DAILY 90 tablet 1  . Calcium Carbonate-Vitamin D (CALCIUM + D PO) Take 2 capsules by mouth daily.    . metoprolol succinate (TOPROL-XL) 50 MG 24 hr tablet Take 1 tablet (50 mg total) by mouth daily. Take with or immediately following a meal. Please keep upcoming appt in October. Final Attempt Thank you 90 tablet 0  . Misc Natural Products (GLUCOSAMINE CHOND COMPLEX/MSM PO) Take 1 tablet by mouth daily.    . Omega-3 Fatty Acids (FISH OIL PO) Take 2 capsules by mouth daily.     . polyethylene glycol (MIRALAX / GLYCOLAX) packet Take 17 g by mouth daily.    Marland Kitchen spironolactone-hydrochlorothiazide (ALDACTAZIDE) 25-25 MG tablet Take 0.5 tablets by mouth daily. Please keep upcoming appt in October. Final Attempt Thank you 45 tablet 0  . TURMERIC PO Take 1 tablet by mouth daily.     Marland Kitchen zolpidem (AMBIEN) 5 MG tablet TAKE 1/2 TABLET BY MOUTH AT NIGHT AS NEEDED FOR SLEEP 30 tablet 0   No current facility-administered medications on file prior to visit.     Past Medical History:  Diagnosis Date  . Arthritis   . Benign essential tremor   . CHF (congestive heart failure) ([place])   . Heart murmur   . High cholesterol   . Hypertension   . Insomnia   . Mitral regurgitation   . Mitral valve prolapse   . Osteopenia   . Paroxysmal atrial fibrillation ([place]) 03/11/2013  . Paroxysmal supraventricular tachycardia ([place])   . PONV (postoperative nausea and vomiting)    Dizziness and Nausea  . S/P Maze operation for atrial fibrillation 03/18/2013   Complete bilateral atrial lesion set using cryothermy and biopolar radiofrequency ablation with oversewing of LA appendage via right mini thoracotomy  . S/P mitral valve repair 03/18/2013   Complex valvuloplasty including triangular resection of posterior leaflet, artificial Gore-tex neocord placement x4 and 31mm Sorin Memo 3D ring annuloplasty via right mini thoracotomy  . Severe mitral regurgitation 03/03/2013   Acute on chronic with acute heart failure   . Shortness of breath   . Wears glasses     Past Surgical History:  Procedure Laterality Date  . ABDOMINAL HYSTERECTOMY     hernia repair  . BIOPSY  08/26/2019   Procedure: BIOPSY;  Surgeon: Wilford Corner, MD;  Location: [facility];  Service: Endoscopy;;  . CARDIAC CATHETERIZATION    . COLONOSCOPY    . COLONOSCOPY WITH PROPOFOL N/A 08/26/2019   Procedure: COLONOSCOPY WITH PROPOFOL;  Surgeon: Wilford Corner, MD;  Location: [facility];  Service: Endoscopy;  Laterality: N/A;   . INTRAOPERATIVE TRANSESOPHAGEAL ECHOCARDIOGRAM N/A 03/18/2013   Procedure: INTRAOPERATIVE TRANSESOPHAGEAL ECHOCARDIOGRAM;  Surgeon: Rexene Alberts, MD;  Location: [facility];  Service: Open Heart Surgery;  Laterality: N/A;  . LEFT AND RIGHT HEART CATHETERIZATION WITH CORONARY ANGIOGRAM N/A 03/06/2013   Procedure: LEFT AND RIGHT HEART CATHETERIZATION WITH CORONARY ANGIOGRAM;  Surgeon: Sinclair Grooms, MD;  [facility] CATH LAB;  Service: Cardiovascular;  Laterality: N/A;  . MAZE N/A 03/18/2013   Procedure: MAZE;  Surgeon: Rexene Alberts, MD;  Location: [facility] OR;  Service: Open Heart Surgery;  Laterality: N/A;  . MITRAL VALVE REPAIR Right 03/18/2013   Procedure: MINIMALLY INVASIVE MITRAL VALVE REPAIR (MVR);  Surgeon: Rexene Alberts, MD;  Location: [place];  Service: Open Heart Surgery;  Laterality: Right;  Marland Kitchen VARICOSE VEIN SURGERY    . WISDOM TOOTH EXTRACTION      Social History   Socioeconomic History  . Marital status: Married    Spouse name: Fritz Pickerel  . Number of children: 3  . Years of education: BS  . Highest education level: Not on file  Occupational History    Employer: [employer]  Social Needs  . Financial resource strain: Not on file  . Food insecurity    Worry: Not on file    Inability: Not on file  . Transportation needs    Medical: Not on file    Non-medical: Not on file  Tobacco Use  . Smoking status: Never Smoker  . Smokeless tobacco: Never Used  Substance and Sexual Activity  . Alcohol use: Yes    Alcohol/week: 5.0 - 6.0 standard drinks    Types: 5 - 6 Glasses of wine per week    Comment: 1 drink/day  . Drug use: No  . Sexual activity: Yes    Birth control/protection: Surgical  Lifestyle  . Physical activity    Days per week: Not on file    Minutes per session: Not on file  . Stress: Not on file  Relationships  . Social Herbalist on phone: Not on file    Gets together: Not on file    Attends religious service: Not on file    Active member  of club or organization: Not on file    Attends meetings of clubs or organizations: Not on file    Relationship status: Not on file  Other Topics Concern  . Not on file  Social History Narrative   Patient lives at home with her husband  Fritz Pickerel)  - second husband. Patient has college education B.S. 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   Caffeine- one cup daily.    Family History  Problem Relation Age of Onset  . Pancreatic cancer Mother   . Stomach cancer Father   . Hypertension Father   . Thyroid cancer Sister   . Colon cancer Paternal Grandmother   . Stomach cancer Paternal Grandfather   . Seizures Child   . Down syndrome Child     Review of Systems  Constitutional: Negative for chills and fever.  Eyes: Negative for visual disturbance.  Respiratory: Negative for cough, shortness of breath and wheezing.   Cardiovascular: Positive for palpitations (occ). Negative for chest pain and leg swelling.  Gastrointestinal: Negative for abdominal pain, blood in stool, constipation, diarrhea and nausea.       No gerd  Genitourinary: Negative for dysuria and hematuria.  Musculoskeletal: Positive for arthralgias and back pain (left back pain/sciatica).  Skin: Positive for rash (patch of redness on left hand - itchy and burning).  Neurological: Positive for dizziness (with changes in position). Negative for headaches.  Psychiatric/Behavioral: Negative for dysphoric mood. The patient is not nervous/anxious.        Objective:   Vitals:   09/05/19 0803  BP: 122/78  Pulse: (!) 50  Resp: 16  Temp: 98.4 F (36.9 C)  SpO2: 97%   Filed Weights   09/05/19 0803  Weight: 166 lb 12.8 oz (75.7  kg)   Body mass index is 26.12 kg/m.  BP Readings from Last 3 Encounters:  09/05/19 122/78  08/26/19 135/77  08/25/19 118/78    Wt Readings from Last 3 Encounters:  09/05/19 166 lb 12.8 oz (75.7 kg)  08/26/19 165 lb (74.8 kg)  08/25/19 166 lb 12.8 oz (75.7 kg)      Hearing Screening   125Hz  250Hz  500Hz  1000Hz  2000Hz  3000Hz  4000Hz  6000Hz  8000Hz   Right ear:           Left ear:             Visual Acuity Screening   Right eye Left eye Both eyes  Without correction:     With correction: 20 25 20 25 20 20     Depression screen [facility] 2/9 09/05/2019 02/19/2018 07/28/2013 04/16/2013  Decreased Interest 0 0 0 0  Down, Depressed, Hopeless 0 0 0 0  PHQ - 2 Score 0 0 0 0      Physical Exam Constitutional: She appears well-developed and well-nourished. No distress.  HENT:  Head: Normocephalic and atraumatic.  Right Ear: External ear normal. Normal ear canal and TM Left Ear: External ear normal.  Normal ear canal and TM Mouth/Throat: Oropharynx is clear and moist.  Eyes: Conjunctivae and EOM are normal.  Neck: Neck supple. No tracheal deviation present. No thyromegaly present.  No carotid bruit  Cardiovascular: Normal rate, regular rhythm and normal heart sounds.   No murmur heard.  No edema. Pulmonary/Chest: Effort normal and breath sounds normal. No respiratory distress. She has no wheezes. She has no rales.  Breast: deferred   Abdominal: Soft. She exhibits no distension. There is no tenderness.  Lymphadenopathy: She has no cervical adenopathy.  Skin: Skin is warm and dry. She is not diaphoretic. Red patch of dry skin on posterior left 4-5th MCP areas Psychiatric: She has a normal mood and affect. Her behavior is normal.        Assessment & Plan:   Health Maintenance  Topic Date Due  . PNA vac Low Risk Adult (1 of 2 - PCV13) 09/04/2020 (Originally 12/04/2018)  . DEXA SCAN  10/17/2019  . MAMMOGRAM  02/02/2021  . TETANUS/TDAP  04/08/2022  . COLONOSCOPY  08/25/2024  . INFLUENZA VACCINE  Completed  . Hepatitis C Screening  Completed  . HIV Screening  Completed     Wellness Exam: Immunizations   prevnar deferred, flu vaccine today, discussed shingrix Colonoscopy   Up to date  Mammogram   Up to date  Dexa   Up to date  Gyn  N/a  - no longer  seeing one Eye exam  Up to date  Hearing loss    Maybe slight-she is not concerned.  Discussed causes of tinnitus Memory concerns/difficulties     none Independent of ADLs  - fully independent Stressed the importance of regular exercise Reviewed diet.  Her cholesterol is slightly higher this last time and we discussed certain foods that increase her LDL cholesterol and she will make some revise meds. Discussed skin cancer screening-monitoring skin to see if there is any changes.  Use of sunscreen.  Seen dermatology as needed.  EKG done by cardiology this month, no need to repeat  Patient received copy of preventative screening tests/immunizations recommended for the next 5-10 years.    Physical exam: Screening blood work    reviewed Immunizations   prevnar deferred, flu vaccine today, discussed shingrix Colonoscopy   Up to date  Mammogram   Up to date  Dexa  Up to date  Gyn  N/a  - no longer seeing one Eye exam  Up to date  Exercise  Zoom yoga Weight    Good for age Substance abuse  none  For red area on posterior hand advised for her to try cortisone.  If no improvement she can let me know.  See Problem List for Assessment and Plan of chronic medical problems.    FU in one year - CPE

## 2019-09-05 ENCOUNTER — Other Ambulatory Visit: Payer: Self-pay | Admitting: Primary Care

## 2019-09-05 ENCOUNTER — Other Ambulatory Visit (INDEPENDENT_AMBULATORY_CARE_PROVIDER_SITE_OTHER): Payer: Medicare Other

## 2019-09-05 ENCOUNTER — Other Ambulatory Visit: Payer: Self-pay

## 2019-09-05 ENCOUNTER — Ambulatory Visit (INDEPENDENT_AMBULATORY_CARE_PROVIDER_SITE_OTHER): Payer: Medicare Other | Admitting: Internal Medicine

## 2019-09-05 ENCOUNTER — Encounter: Payer: Self-pay | Admitting: Internal Medicine

## 2019-09-05 VITALS — BP 122/78 | HR 50 | Temp 98.4°F | Resp 16 | Ht 67.0 in | Wt 166.8 lb

## 2019-09-05 DIAGNOSIS — E7849 Other hyperlipidemia: Secondary | ICD-10-CM

## 2019-09-05 DIAGNOSIS — Z0000 Encounter for general adult medical examination without abnormal findings: Secondary | ICD-10-CM | POA: Diagnosis not present

## 2019-09-05 DIAGNOSIS — I480 Paroxysmal atrial fibrillation: Secondary | ICD-10-CM

## 2019-09-05 DIAGNOSIS — G4700 Insomnia, unspecified: Secondary | ICD-10-CM

## 2019-09-05 DIAGNOSIS — M85851 Other specified disorders of bone density and structure, right thigh: Secondary | ICD-10-CM | POA: Diagnosis not present

## 2019-09-05 DIAGNOSIS — R251 Tremor, unspecified: Secondary | ICD-10-CM

## 2019-09-05 DIAGNOSIS — Z23 Encounter for immunization: Secondary | ICD-10-CM

## 2019-09-05 LAB — CBC WITH DIFFERENTIAL/PLATELET
Basophils Absolute: 0 10*3/uL (ref 0.0–0.1)
Basophils Relative: 0.8 % (ref 0.0–3.0)
Eosinophils Absolute: 0.1 10*3/uL (ref 0.0–0.7)
Eosinophils Relative: 2.1 % (ref 0.0–5.0)
HCT: 38.4 % (ref 36.0–46.0)
Hemoglobin: 13.4 g/dL (ref 12.0–15.0)
Lymphocytes Relative: 38.1 % (ref 12.0–46.0)
Lymphs Abs: 1.8 10*3/uL (ref 0.7–4.0)
MCHC: 35 g/dL (ref 30.0–36.0)
MCV: 91.1 fl (ref 78.0–100.0)
Monocytes Absolute: 0.4 10*3/uL (ref 0.1–1.0)
Monocytes Relative: 8.9 % (ref 3.0–12.0)
Neutro Abs: 2.4 10*3/uL (ref 1.4–7.7)
Neutrophils Relative %: 50.1 % (ref 43.0–77.0)
Platelets: 189 10*3/uL (ref 150.0–400.0)
RBC: 4.21 Mil/uL (ref 3.87–5.11)
RDW: 13 % (ref 11.5–15.5)
WBC: 4.8 10*3/uL (ref 4.0–10.5)

## 2019-09-05 LAB — COMPREHENSIVE METABOLIC PANEL
ALT: 17 U/L (ref 0–35)
AST: 21 U/L (ref 0–37)
Albumin: 4.6 g/dL (ref 3.5–5.2)
Alkaline Phosphatase: 61 U/L (ref 39–117)
BUN: 17 mg/dL (ref 6–23)
CO2: 29 mEq/L (ref 19–32)
Calcium: 9.9 mg/dL (ref 8.4–10.5)
Chloride: 103 mEq/L (ref 96–112)
Creatinine, Ser: 0.99 mg/dL (ref 0.40–1.20)
GFR: 56.16 mL/min — ABNORMAL LOW (ref >60.00)
Glucose, Bld: 91 mg/dL (ref 70–99)
Potassium: 4.4 mEq/L (ref 3.5–5.1)
Sodium: 140 mEq/L (ref 135–145)
Total Bilirubin: 0.7 mg/dL (ref 0.2–1.2)
Total Protein: 7 g/dL (ref 6.0–8.3)

## 2019-09-05 LAB — LIPID PANEL
Cholesterol: 176 mg/dL (ref 0–200)
HDL: 54.6 mg/dL (ref >39.00)
LDL Cholesterol: 95 mg/dL (ref 0–99)
NonHDL: 121.45
Total CHOL/HDL Ratio: 3
Triglycerides: 130 mg/dL (ref 0.0–149.0)
VLDL: 26 mg/dL (ref 0.0–40.0)

## 2019-09-05 LAB — TSH: TSH: 2.32 u[IU]/mL (ref 0.35–4.50)

## 2019-09-05 NOTE — Assessment & Plan Note (Signed)
Takes ambien as need only ( < 50 %) Prefers not to take it, but has difficulty sleeping Ok to continue

## 2019-09-05 NOTE — Assessment & Plan Note (Addendum)
S/p maze Follows with cardio On metoprolol Feels occasional palpitations-no change

## 2019-09-05 NOTE — Assessment & Plan Note (Signed)
Cardio would like LDL < 70 Check lipid panel, cmp, tsh Continue daily statin Regular exercise and healthy diet encouraged

## 2019-09-05 NOTE — Assessment & Plan Note (Signed)
dexa up to date Exercising Taking cal/vit d daily

## 2019-09-05 NOTE — Assessment & Plan Note (Signed)
Following with neurology, Dr. Carles Collet

## 2019-09-07 ENCOUNTER — Encounter: Payer: Self-pay | Admitting: Internal Medicine

## 2019-09-11 ENCOUNTER — Ambulatory Visit: Payer: Medicare Other

## 2019-09-19 ENCOUNTER — Ambulatory Visit: Payer: BLUE CROSS/BLUE SHIELD | Admitting: Neurology

## 2019-09-23 ENCOUNTER — Encounter: Payer: Self-pay | Admitting: Primary Care

## 2019-09-24 ENCOUNTER — Encounter: Payer: Self-pay | Admitting: Primary Care

## 2019-09-24 ENCOUNTER — Ambulatory Visit: Payer: Medicare Other | Attending: Primary Care | Admitting: Primary Care

## 2019-09-24 VITALS — BP 118/72 | HR 76 | Ht 63.0 in | Wt 115.0 lb

## 2019-09-24 DIAGNOSIS — N3941 Urge incontinence: Secondary | ICD-10-CM | POA: Insufficient documentation

## 2019-09-24 DIAGNOSIS — M810 Age-related osteoporosis without current pathological fracture: Secondary | ICD-10-CM | POA: Insufficient documentation

## 2019-09-24 DIAGNOSIS — E034 Atrophy of thyroid (acquired): Secondary | ICD-10-CM | POA: Insufficient documentation

## 2019-09-24 DIAGNOSIS — Z23 Encounter for immunization: Secondary | ICD-10-CM | POA: Insufficient documentation

## 2019-09-24 DIAGNOSIS — I10 Essential (primary) hypertension: Secondary | ICD-10-CM | POA: Insufficient documentation

## 2019-09-24 DIAGNOSIS — E7800 Pure hypercholesterolemia, unspecified: Secondary | ICD-10-CM | POA: Insufficient documentation

## 2019-09-24 DIAGNOSIS — Z0000 Encounter for general adult medical examination without abnormal findings: Secondary | ICD-10-CM | POA: Insufficient documentation

## 2019-09-24 LAB — PCMH DEPRESSION ASSESSMENT

## 2019-09-24 LAB — PCMH FALL RISK ASSESSMENT

## 2019-09-24 LAB — PCMH FALL RISK PLAN
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
## 2019-09-26 ENCOUNTER — Ambulatory Visit: Payer: BLUE CROSS/BLUE SHIELD | Admitting: Interventional Cardiology

## 2019-09-28 ENCOUNTER — Encounter: Payer: Self-pay | Admitting: Primary Care

## 2019-09-28 NOTE — Patient Instructions (Signed)
Thank you for completing your Welcome to Medicare visit   with [place] today.     The purpose of this visits was to:     Screen for disease   Assess risk of future medical problems   Help develop a healthy lifestyle   Update vaccines   Get to know your doctor in case of an illness    Patient Care Team:  [name] (Inactive) as PCP - CCP-AUDIOLOGY  Yesenia Must, MD as PCP - General  [name], AUD as PCP - CCP-AUDIOLOGY     Medicare 5 Year Plan    The following items were identified as areas of concern during your screening today:  High Blood Pressure (Hypertension) - This is a risk factor for Heart Attack, Stroke, Kidney Problems and Eye Problems.       The Health Maintenance table below identifies screening tests and immunizations recommended by your health care team:  Health Maintenance   Topic Date Due    HIV Screening  12[birth year]04/1968    Shingles Vaccine (2 of 3) 03/27/2014    Adult Prevnar Vaccination (1) 12/04/2018    Adult Pneumovax Vaccine (1) 12/04/2018    Osteoporosis Screening  12/18/2019    Breast Cancer Screening  08/04/2020    DEPRESSION SCREEN YEARLY  09/23/2020    Fall Risk Screening  09/23/2020    Cervical Cancer Screening   06/10/2021    Colon Cancer Screening  07/12/2022    Flu Shot  Completed    Hepatitis C Screening  Completed     In addition, goals and orders placed to address these recommendations are listed in the "Today's Visit" section.    We wish you the best of health and look forward to seeing you again next year for your Annual Medicare Wellness Visit.     If you have any health care concerns before then, please do not hesitate to contact us.

## 2019-10-01 ENCOUNTER — Other Ambulatory Visit: Payer: Self-pay | Admitting: Primary Care

## 2019-10-08 ENCOUNTER — Other Ambulatory Visit: Payer: Self-pay | Admitting: Internal Medicine

## 2019-10-08 ENCOUNTER — Other Ambulatory Visit: Payer: Self-pay | Admitting: Interventional Cardiology

## 2019-10-08 DIAGNOSIS — I10 Essential (primary) hypertension: Secondary | ICD-10-CM

## 2019-10-08 MED ORDER — SPIRONOLACTONE-HCTZ 25-25 MG PO TABS: 0.5000 | ORAL_TABLET | Freq: Every day | ORAL | 3 refills | Status: DC

## 2019-10-08 MED ORDER — METOPROLOL SUCCINATE ER 50 MG PO TB24: 50.0000 mg | ORAL_TABLET | Freq: Every day | ORAL | 3 refills | Status: DC

## 2019-10-14 ENCOUNTER — Ambulatory Visit: Payer: Self-pay

## 2019-10-14 ENCOUNTER — Other Ambulatory Visit: Payer: Self-pay

## 2019-10-14 ENCOUNTER — Encounter: Payer: Self-pay | Admitting: Sports Medicine

## 2019-10-14 ENCOUNTER — Ambulatory Visit: Payer: Medicare Other | Admitting: Sports Medicine

## 2019-10-14 VITALS — BP 122/82 | Ht 67.0 in | Wt 163.0 lb

## 2019-10-14 DIAGNOSIS — M25562 Pain in left knee: Secondary | ICD-10-CM

## 2019-10-14 NOTE — Patient Instructions (Signed)
Your pain is caused by an injury to the meniscus.  This is ring of cartilage in the knee that helps provide cushion -The ultrasound of your knee showed some bone spurs as well as damage to the meniscus.  You also have fluid in your knee as well as a small Baker's cyst -We have fitted you for a body helix compression sleeve.  Wear the sleeve with activity.  This will help improve stability in the swelling of your knee -Work on the quad strengthening exercises shown to you at today's visit  We will see you back in 3 weeks as a virtual visit.  If your symptoms worsen we can always change this to an in person visit

## 2019-10-14 NOTE — Progress Notes (Signed)
PCP: Binnie Rail, MD  Subjective:   HPI: Patient is a 63 y.o. female here for evaluation of left knee instability.  Patient notes her knee has felt unstable for the last several months.  It acutely worsened over the last week.  She denies any pain but feels like her knee is going to give out.  She also gets frequent catching and popping of her left knee.  She feels like this is coming from the lateral aspect of her knee.  She denies any bruising or swelling of her knee.  In the past she was told she had a Baker's cyst of her knee but she did not do anything for this.  She recently bought a hinged knee brace which has not helped her symptoms.  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  She denies any injury or trauma to her knee.  Patient is currently not experiencing any pain in her knee.   Review of Systems: See HPI above.  Past Medical History:  Diagnosis Date  . Arthritis   . Benign essential tremor   . CHF (congestive heart failure) ([place])   . Heart murmur   . High cholesterol   . Hypertension   . Insomnia   . Mitral regurgitation   . Mitral valve prolapse   . Osteopenia   . Paroxysmal atrial fibrillation ([place]) 03/11/2013  . Paroxysmal supraventricular tachycardia ([place])   . PONV (postoperative nausea and vomiting)    Dizziness and Nausea  . S/P Maze operation for atrial fibrillation 03/18/2013   Complete bilateral atrial lesion set using cryothermy and biopolar radiofrequency ablation with oversewing of LA appendage via right mini thoracotomy  . S/P mitral valve repair 03/18/2013   Complex valvuloplasty including triangular resection of posterior leaflet, artificial Gore-tex neocord placement x4 and 4mm Sorin Memo 3D ring annuloplasty via right mini thoracotomy  . Severe mitral regurgitation 03/03/2013   Acute on chronic with acute heart failure   . Shortness of breath   . Wears glasses     Current Outpatient Medications on File  Prior to Visit  Medication Sig Dispense Refill  . amoxicillin (AMOXIL) 500 MG capsule TAKE 4 CAPSULES BY MOUTH 1 HOUR PRIOR TO DENTAL APPOINTMENT 4 capsule 3  . aspirin 81 MG tablet Take 1 tablet (81 mg total) by mouth daily.    Marland Kitchen atorvastatin (LIPITOR) 20 MG tablet TAKE 1 TABLET BY MOUTH DAILY 90 tablet 1  . Calcium Carbonate-Vitamin D (CALCIUM + D PO) Take 2 capsules by mouth daily.    . metoprolol succinate (TOPROL-XL) 50 MG 24 hr tablet Take 1 tablet (50 mg total) by mouth daily. Take with or immediately following a meal. 90 tablet 3  . Misc Natural Products (GLUCOSAMINE CHOND COMPLEX/MSM PO) Take 1 tablet by mouth daily.    . Omega-3 Fatty Acids (FISH OIL PO) Take 2 capsules by mouth daily.    . polyethylene glycol (MIRALAX / GLYCOLAX) packet Take 17 g by mouth daily.    Marland Kitchen spironolactone-hydrochlorothiazide (ALDACTAZIDE) 25-25 MG tablet Take 0.5 tablets by mouth daily. 45 tablet 3  . TURMERIC PO Take 1 tablet by mouth daily.     Marland Kitchen zolpidem (AMBIEN) 5 MG tablet TAKE 1/2 TABLET BY MOUTH AT NIGHT AS NEEDED FOR SLEEP 30 tablet 0   No current facility-administered medications on file prior to visit.     Past Surgical History:  Procedure Laterality Date  . ABDOMINAL HYSTERECTOMY     hernia  repair  . BIOPSY  08/26/2019   Procedure: BIOPSY;  Surgeon: Wilford Corner, MD;  Location: [facility];  Service: Endoscopy;;  . CARDIAC CATHETERIZATION    . COLONOSCOPY    . COLONOSCOPY WITH PROPOFOL N/A 08/26/2019   Procedure: COLONOSCOPY WITH PROPOFOL;  Surgeon: Wilford Corner, MD;  Location: [facility];  Service: Endoscopy;  Laterality: N/A;  . INTRAOPERATIVE TRANSESOPHAGEAL ECHOCARDIOGRAM N/A 03/18/2013   Procedure: INTRAOPERATIVE TRANSESOPHAGEAL ECHOCARDIOGRAM;  Surgeon: Rexene Alberts, MD;  Location: [place];  Service: Open Heart Surgery;  Laterality: N/A;  . LEFT AND RIGHT HEART CATHETERIZATION WITH CORONARY ANGIOGRAM N/A 03/06/2013   Procedure: LEFT AND RIGHT HEART CATHETERIZATION WITH  CORONARY ANGIOGRAM;  Surgeon: Sinclair Grooms, MD;  Location: [facility] CATH LAB;  Service: Cardiovascular;  Laterality: N/A;  . MAZE N/A 03/18/2013   Procedure: MAZE;  Surgeon: Rexene Alberts, MD;  Location: [place];  Service: Open Heart Surgery;  Laterality: N/A;  . MITRAL VALVE REPAIR Right 03/18/2013   Procedure: MINIMALLY INVASIVE MITRAL VALVE REPAIR (MVR);  Surgeon: Rexene Alberts, MD;  Location: [place];  Service: Open Heart Surgery;  Laterality: Right;  Marland Kitchen VARICOSE VEIN SURGERY    . WISDOM TOOTH EXTRACTION      No Known Allergies  Social History   Socioeconomic History  . Marital status: Married    Spouse name: Fritz Pickerel  . Number of children: 3  . Years of education: BS  . Highest education level: Not on file  Occupational History    Employer: [employer]  Social Needs  . Financial resource strain: Not on file  . Food insecurity    Worry: Not on file    Inability: Not on file  . Transportation needs    Medical: Not on file    Non-medical: Not on file  Tobacco Use  . Smoking status: Never Smoker  . Smokeless tobacco: Never Used  Substance and Sexual Activity  . Alcohol use: Yes    Alcohol/week: 5.0 - 6.0 standard drinks    Types: 5 - 6 Glasses of wine per week    Comment: 1 drink/day  . Drug use: No  . Sexual activity: Yes    Birth control/protection: Surgical  Lifestyle  . Physical activity    Days per week: Not on file    Minutes per session: Not on file  . Stress: Not on file  Relationships  . Social Herbalist on phone: Not on file    Gets together: Not on file    Attends religious service: Not on file    Active member of club or organization: Not on file    Attends meetings of clubs or organizations: Not on file    Relationship status: Not on file  . Intimate partner violence    Fear of current or ex partner: Not on file    Emotionally abused: Not on file    Physically abused: Not on file    Forced sexual activity: Not on file  Other  Topics Concern  . Not on file  Social History Narrative   Patient lives at home with her husband  Fritz Pickerel)  - second husband. Patient has college education B.S. 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   Caffeine- one cup daily.    Family History  Problem Relation Age of Onset  . Pancreatic cancer Mother   . Stomach cancer Father   . Hypertension Father   . Thyroid cancer  Sister   . Colon cancer Paternal Grandmother   . Stomach cancer Paternal Grandfather   . Seizures Child   . Down syndrome Child         Objective:  Physical Exam: BP 122/82   Ht 5\' 7"  (1.702 m)   Wt 163 lb (73.9 kg)   BMI 25.53 kg/m  Gen: NAD, comfortable in exam room Lungs: Breathing comfortably on room air Knee Exam Left -Inspection: no deformity, no discoloration -Palpation: No tenderness palpation. -ROM: Extension: 0 degrees; Flexion: 150 degrees -Strength: Extension: 5/5; Flexion: 5/5 -Special Tests: Varus Stress: Negative; Valgus Stress: Negative; Lachman: Negative; Posterior drawer: Negative; McMurray: Negative; Thessaly: Positive -Limb neurovascularly intact, no instability noted  Contralateral Knee -Inspection: no deformity, no discoloration -Palpation: No tenderness to palpation -ROM: Extension: 0 degrees; Flexion: 150 degrees -Strength: Extension: 5/5; Flexion: 5/5 -Limb neurovascularly intact, no instability noted  Diagnostic ultrasound examination of the left knee Findings: -Small to moderate joint effusion noted in the suprapatellar pouch. -Normal appearance of the quadriceps and patellar tendons -Bone spurs noted along the lateral joint line -Fraying and irregularity is noted along the lateral meniscus.  No large tear visualized -Fraying and irregularity is noted along the medial meniscus.  No large tears seen. -Small to moderate sized Baker's cyst noted in the popliteal fossa. Impression: -Mild arthritic changes of the left knee along with possible  degenerative changes of the medial lateral meniscus.  Moderate joint effusion as well as a Baker's cyst seen in the popliteal fossa   Assessment & Plan:  Patient is a 63 y.o. female here for evaluation of left knee instability  1.  Left knee instability -Likely caused by degenerative changes of her meniscus. -Ultrasound findings described above.  Patient with a joint effusion/Baker's cyst as well as degenerative changes of the meniscus -Patient was fitted for a body helix compression sleeve -Patient given isometric quad exercises -Patient will follow-up in 3 weeks as a virtual visit.  If her symptoms worsen significantly we may changes to an in person visit.  If her symptoms do not improve will consider corticosteroid injections and/or MRI of her knee  Patient seen and evaluated with the sports medicine fellow.  I agree with the above plan of care.  Treatment as above.  Follow-up in 3 weeks for reevaluation.  Call with questions or concerns in the interim.

## 2019-10-22 ENCOUNTER — Other Ambulatory Visit: Payer: Self-pay | Admitting: Neurology

## 2019-10-22 ENCOUNTER — Other Ambulatory Visit: Payer: Self-pay | Admitting: Primary Care

## 2019-10-24 ENCOUNTER — Other Ambulatory Visit: Payer: Self-pay | Admitting: Neurology

## 2019-11-04 ENCOUNTER — Telehealth: Payer: Medicare Other | Admitting: Sports Medicine

## 2019-11-09 ENCOUNTER — Other Ambulatory Visit: Payer: Self-pay | Admitting: Primary Care

## 2019-11-24 ENCOUNTER — Other Ambulatory Visit: Payer: Self-pay | Admitting: Primary Care

## 2019-11-26 ENCOUNTER — Other Ambulatory Visit: Payer: Self-pay | Admitting: Primary Care

## 2019-11-26 DIAGNOSIS — R197 Diarrhea, unspecified: Secondary | ICD-10-CM

## 2019-11-26 DIAGNOSIS — Z20822 Contact with and (suspected) exposure to covid-19: Secondary | ICD-10-CM

## 2019-11-26 NOTE — Progress Notes (Signed)
She and her husband both developed diarrhea  She also has some sinus sx  No SOB, cough, will send for covid testing

## 2019-11-27 ENCOUNTER — Ambulatory Visit
Admission: AD | Admit: 2019-11-27 | Discharge: 2019-11-27 | Disposition: A | Discharge status: Home / Self Care | Payer: Medicare Other | Source: Ambulatory Visit | Attending: Primary Care | Admitting: Primary Care

## 2019-11-27 ENCOUNTER — Other Ambulatory Visit: Payer: Self-pay | Admitting: Primary Care

## 2019-11-27 ENCOUNTER — Encounter: Payer: Self-pay | Admitting: Primary Care

## 2019-11-27 DIAGNOSIS — L821 Other seborrheic keratosis: Secondary | ICD-10-CM | POA: Diagnosis not present

## 2019-11-27 DIAGNOSIS — D485 Neoplasm of uncertain behavior of skin: Secondary | ICD-10-CM | POA: Diagnosis not present

## 2019-11-27 DIAGNOSIS — D2239 Melanocytic nevi of other parts of face: Secondary | ICD-10-CM | POA: Diagnosis not present

## 2019-11-27 DIAGNOSIS — L723 Sebaceous cyst: Secondary | ICD-10-CM | POA: Diagnosis not present

## 2019-11-27 DIAGNOSIS — L819 Disorder of pigmentation, unspecified: Secondary | ICD-10-CM | POA: Diagnosis not present

## 2019-11-27 DIAGNOSIS — D225 Melanocytic nevi of trunk: Secondary | ICD-10-CM | POA: Diagnosis not present

## 2019-11-27 DIAGNOSIS — Z20828 Contact with and (suspected) exposure to other viral communicable diseases: Secondary | ICD-10-CM | POA: Insufficient documentation

## 2019-11-27 DIAGNOSIS — R197 Diarrhea, unspecified: Secondary | ICD-10-CM | POA: Insufficient documentation

## 2019-11-27 DIAGNOSIS — Z20822 Contact with and (suspected) exposure to covid-19: Secondary | ICD-10-CM | POA: Insufficient documentation

## 2019-11-27 MED ORDER — EPINEPHRINE 0.3 MG/0.3ML IJ SOAJ *I*
0.3000 mg | INTRAMUSCULAR | 0 refills | Status: DC | PRN
Start: 2019-11-27 — End: 2022-01-25

## 2019-11-27 NOTE — Telephone Encounter (Signed)
Pt says you suggested she have an Epi pen with her when she gets the covid vaccine . Can she have rx sent to [facility] shipping?  Order entered

## 2019-11-27 NOTE — ED Notes (Signed)
Patient presenting to Urgent Care for testing only. COVID-19 test ordered by outside Provider.     NP swab obtained and sent for analysis.  Patient was offered a medical evaluation with an Urgent Care provider and has declined.

## 2019-11-28 LAB — COVID-19 NAAT (PCR): COVID-19 NAAT (PCR): NEGATIVE

## 2019-11-28 LAB — COVID-19 PCR

## 2019-12-01 ENCOUNTER — Other Ambulatory Visit: Payer: Self-pay | Admitting: Primary Care

## 2019-12-01 DIAGNOSIS — N3941 Urge incontinence: Secondary | ICD-10-CM

## 2019-12-01 MED ORDER — DARIFENACIN HYDROBROMIDE 15 MG PO TB24 *A*
15.0000 mg | ORAL_TABLET | Freq: Every day | ORAL | 11 refills | Status: DC
Start: 2019-12-01 — End: 2020-05-30

## 2019-12-04 ENCOUNTER — Other Ambulatory Visit: Payer: Self-pay | Admitting: Pulmonary Disease

## 2019-12-04 ENCOUNTER — Other Ambulatory Visit: Payer: Self-pay | Admitting: Internal Medicine

## 2019-12-04 DIAGNOSIS — Z23 Encounter for immunization: Secondary | ICD-10-CM

## 2019-12-09 DIAGNOSIS — Z23 Encounter for immunization: Secondary | ICD-10-CM | POA: Diagnosis not present

## 2019-12-12 ENCOUNTER — Ambulatory Visit: Payer: Medicare Other

## 2019-12-12 ENCOUNTER — Ambulatory Visit: Payer: Medicare Other | Attending: Internal Medicine

## 2019-12-12 DIAGNOSIS — Z23 Encounter for immunization: Secondary | ICD-10-CM | POA: Insufficient documentation

## 2019-12-12 NOTE — Progress Notes (Signed)
   Covid-19 Vaccination Clinic  Name:  Ariel Simpson    MRN: [id number] DOB: [birth year]  12/12/2019  Ariel Simpson was observed post Covid-19 immunization for 15 minutes without incidence. She was provided with Vaccine Information Sheet and instruction to access the V-Safe system.   Ariel Simpson was instructed to call 911 with any severe reactions post vaccine: Marland Kitchen Difficulty breathing  . Swelling of your face and throat  . A fast heartbeat  . A bad rash all over your body  . Dizziness and weakness    Immunizations Administered    Name Date Dose VIS Date Route   Pfizer COVID-19 Vaccine 12/12/2019  2:20 PM 0.3 mL 10/31/2019 Intramuscular   Manufacturer: [place]   Lot: [id number]   [place]: [id number]

## 2019-12-16 ENCOUNTER — Other Ambulatory Visit: Payer: Self-pay

## 2019-12-16 ENCOUNTER — Encounter: Payer: Self-pay | Admitting: Internal Medicine

## 2019-12-16 MED ORDER — ZOLPIDEM TARTRATE 5 MG PO TABS: ORAL_TABLET | ORAL | 0 refills | Status: DC

## 2019-12-16 NOTE — Telephone Encounter (Signed)
Last OV 09/05/19 Next OV 09/06/20 Last RF 04/25/19

## 2019-12-24 ENCOUNTER — Other Ambulatory Visit: Payer: Self-pay | Admitting: Primary Care

## 2019-12-25 ENCOUNTER — Other Ambulatory Visit: Payer: Self-pay | Admitting: Primary Care

## 2019-12-25 MED ORDER — PSEUDOEPHEDRINE HCL 120 MG PO TB12 *I*
120.0000 mg | ORAL_TABLET | Freq: Two times a day (BID) | ORAL | 1 refills | Status: DC
Start: 2019-12-25 — End: 2020-06-23

## 2019-12-28 ENCOUNTER — Other Ambulatory Visit: Payer: Self-pay | Admitting: Primary Care

## 2019-12-28 DIAGNOSIS — E785 Hyperlipidemia, unspecified: Secondary | ICD-10-CM

## 2019-12-31 ENCOUNTER — Ambulatory Visit: Payer: Medicare Other | Attending: Internal Medicine

## 2019-12-31 DIAGNOSIS — Z23 Encounter for immunization: Secondary | ICD-10-CM | POA: Insufficient documentation

## 2019-12-31 NOTE — Progress Notes (Signed)
   Covid-19 Vaccination Clinic  Name:  Ariel Simpson    MRN: [id number] DOB: 1956/09/18  12/31/2019  [name] was observed post Covid-19 immunization for 15 minutes without incidence. She was provided with Vaccine Information Sheet and instruction to access the V-Safe system.   [name] was instructed to call 911 with any severe reactions post vaccine: Marland Kitchen Difficulty breathing  . Swelling of your face and throat  . A fast heartbeat  . A bad rash all over your body  . Dizziness and weakness    Immunizations Administered    Name Date Dose VIS Date Route   Pfizer COVID-19 Vaccine 12/31/2019  9:47 AM 0.3 mL 10/31/2019 Intramuscular   Manufacturer: [place]   Lot: [id number]   [place]: [id number]

## 2020-01-06 NOTE — Progress Notes (Deleted)
    Ariel Simpson was seen today in follow up for tremor.  My previous records as well as any outside records made available were reviewed prior to todays visit.  I have not seen the patient in almost 2 years.  At that time, she was not on any medication for tremor but I recommended yearly f/u because she did have asymmetric tremor with a rest component.   Pt denies falls.  Pt denies lightheadedness, near syncope.  No hallucinations.  Mood has been good.  Current prescribed movement disorder medications: ***   PREVIOUS MEDICATIONS: {Parkinson's RX:18200}  ALLERGIES:  No Known Allergies  CURRENT MEDICATIONS:  Outpatient Encounter Medications as of 01/08/2020  Medication Sig  . amoxicillin (AMOXIL) 500 MG capsule TAKE 4 CAPSULES BY MOUTH 1 HOUR PRIOR TO DENTAL APPOINTMENT  . aspirin 81 MG tablet Take 1 tablet (81 mg total) by mouth daily.  Marland Kitchen atorvastatin (LIPITOR) 20 MG tablet TAKE 1 TABLET BY MOUTH DAILY  . Calcium Carbonate-Vitamin D (CALCIUM + D PO) Take 2 capsules by mouth daily.  . metoprolol succinate (TOPROL-XL) 50 MG 24 hr tablet Take 1 tablet (50 mg total) by mouth daily. Take with or immediately following a meal.  . Misc Natural Products (GLUCOSAMINE CHOND COMPLEX/MSM PO) Take 1 tablet by mouth daily.  . Omega-3 Fatty Acids (FISH OIL PO) Take 2 capsules by mouth daily.  . polyethylene glycol (MIRALAX / GLYCOLAX) packet Take 17 g by mouth daily.  Marland Kitchen spironolactone-hydrochlorothiazide (ALDACTAZIDE) 25-25 MG tablet Take 0.5 tablets by mouth daily.  . TURMERIC PO Take 1 tablet by mouth daily.   Marland Kitchen zolpidem (AMBIEN) 5 MG tablet TAKE 1/2 TABLET BY MOUTH AT NIGHT AS NEEDED FOR SLEEP   No facility-administered encounter medications on file as of 01/08/2020.    PHYSICAL EXAMINATION:    VITALS:  There were no vitals filed for this visit.  GEN:  The patient appears stated age and is in NAD. HEENT:  Normocephalic, atraumatic.  The mucous membranes are moist. The superficial temporal  arteries are without ropiness or tenderness. CV:  RRR Lungs:  CTAB Neck/HEME:  There are no carotid bruits bilaterally.  Neurological examination:  Orientation: The patient is alert and oriented x3. Cranial nerves: There is good facial symmetry. The speech is fluent and clear. Soft palate rises symmetrically and there is no tongue deviation. Hearing is intact to conversational tone. Sensation: Sensation is intact to light touch throughout Motor: Strength is at least antigravity x4.  Movement examination: Tone: There is normal tone in the UE/LE Abnormal movements: *** Coordination:  There is *** decremation with RAM's, *** Gait and Station: The patient has *** difficulty arising out of a deep-seated chair without the use of the hands. The patient's stride length is good   ASSESSMENT/PLAN:  1.  Essential Tremor  ***  Total time spent on today's visit was ***greater than 30 minutes, including both face-to-face time and nonface-to-face time.  Time included that spent on review of records (prior notes available to me/labs/imaging if pertinent), discussing treatment and goals, answering patient's questions and coordinating care.  Cc:  Binnie Rail, MD

## 2020-01-07 ENCOUNTER — Ambulatory Visit: Payer: Medicare Other | Admitting: Neurology

## 2020-01-08 ENCOUNTER — Ambulatory Visit: Payer: Medicare Other | Admitting: Neurology

## 2020-01-09 DIAGNOSIS — Z23 Encounter for immunization: Secondary | ICD-10-CM | POA: Diagnosis not present

## 2020-01-15 NOTE — Progress Notes (Signed)
Ariel Simpson was seen today in follow up for essential tremor.  My previous records were reviewed prior to todays visit.  I have not seen the patient in about 2 years.  At that point, I thought that the patient likely had asymmetric essential tremor.  However, given the fact that she had a rest component and it was asymmetric, I thought we should monitor it and follow-up.  She was scheduled for a 1 year follow-up, but the pandemic been hit and the patient did not want a video visit, so it ended up being a 2-year follow-up.  I have reviewed others records.  She last saw her primary care physician on October 16.  She is on metoprolol for rate control for paroxysmal atrial fibrillation.  Pt reports that tremor is progressing but slowly - "nothing dramatic has happened."  She can feel the right leg tremor at rest but others can see it.  She notes trouble writing and trouble with needle work due to the R hand tremor.  She does yoga and notes some trouble with standing on the R foot but she has a bad ankle so she attributes it to that.  States that she walks "wobbly."   States that she forces her stride length to be smaller because of bad knees and ankles.  She has to hold a wine glass differently because of the tremor but she is able to eat.  She is able to cook.      ALLERGIES:  No Known Allergies  CURRENT MEDICATIONS:  Outpatient Encounter Medications as of 01/16/2020  Medication Sig  . aspirin 81 MG tablet Take 1 tablet (81 mg total) by mouth daily.  Marland Kitchen atorvastatin (LIPITOR) 20 MG tablet TAKE 1 TABLET BY MOUTH DAILY  . Calcium Carbonate-Vitamin D (CALCIUM + D PO) Take 2 capsules by mouth daily.  . metoprolol succinate (TOPROL-XL) 50 MG 24 hr tablet Take 1 tablet (50 mg total) by mouth daily. Take with or immediately following a meal.  . Misc Natural Products (GLUCOSAMINE CHOND COMPLEX/MSM PO) Take 1 tablet by mouth daily.  . Omega-3 Fatty Acids (FISH OIL PO) Take 2 capsules by mouth daily.  .  polyethylene glycol (MIRALAX / GLYCOLAX) packet Take 17 g by mouth daily.  Marland Kitchen spironolactone-hydrochlorothiazide (ALDACTAZIDE) 25-25 MG tablet Take 0.5 tablets by mouth daily.  . TURMERIC PO Take 1 tablet by mouth daily.   Marland Kitchen zolpidem (AMBIEN) 5 MG tablet TAKE 1/2 TABLET BY MOUTH AT NIGHT AS NEEDED FOR SLEEP  . amoxicillin (AMOXIL) 500 MG capsule TAKE 4 CAPSULES BY MOUTH 1 HOUR PRIOR TO DENTAL APPOINTMENT (Patient not taking: Reported on 01/16/2020)   No facility-administered encounter medications on file as of 01/16/2020.    PHYSICAL EXAMINATION:    VITALS:   Vitals:   01/16/20 0817  BP: 102/68  Pulse: 72  SpO2: 98%  Weight: 172 lb 9.6 oz (78.3 kg)  Height: 5\' 7"  (1.702 m)   Wt Readings from Last 3 Encounters:  01/16/20 172 lb 9.6 oz (78.3 kg)  10/14/19 163 lb (73.9 kg)  09/05/19 166 lb 12.8 oz (75.7 kg)     GEN:  The patient appears stated age and is in NAD. HEENT:  Normocephalic, atraumatic.  The mucous membranes are moist. The superficial temporal arteries are without ropiness or tenderness. CV:  RRR Lungs:  CTAB Neck/HEME:  There are no carotid bruits bilaterally.  Neurological examination:  Orientation: The patient is alert and oriented x3. Cranial nerves: There is good facial  symmetry. The speech is fluent and clear. Soft palate rises symmetrically and there is no tongue deviation. Hearing is intact to conversational tone. Sensation: Sensation is intact to light touch throughout Motor: Strength is at least antigravity x4.  Movement examination: Tone: There is normal tone in the UE/LE Abnormal movements: there is no rest tremor even with distraction.  She has very mild postural tremor, worse with half way between pronation and supination, worse on the right than L but there is some on the L.  It is evident with a weight but better in the wing beating position.  Mild tremor with archimedes spirals, not much different than in 2018. Coordination:  There is no decremation  with RAM's, with any form of RAMS, including alternating supination and pronation of the forearm, hand opening and closing, finger taps Gait and Station: The patient has no difficulty arising out of a deep-seated chair without the use of the hands. The patient's stride length is good  I have reviewed and interpreted the following labs independently   Chemistry      Component Value Date/Time   NA 140 09/05/2019 0910   NA 141 08/25/2019 1134   K 4.4 09/05/2019 0910   CL 103 09/05/2019 0910   CO2 29 09/05/2019 0910   BUN 17 09/05/2019 0910   BUN 14 08/25/2019 1134   CREATININE 0.99 09/05/2019 0910   CREATININE 0.96 10/10/2013 1606      Component Value Date/Time   CALCIUM 9.9 09/05/2019 0910   ALKPHOS 61 09/05/2019 0910   AST 21 09/05/2019 0910   ALT 17 09/05/2019 0910   BILITOT 0.7 09/05/2019 0910   BILITOT 0.6 08/25/2019 1134     Lab Results  Component Value Date   TSH 2.32 09/05/2019      ASSESSMENT/PLAN:  1.  Essential Tremor  -Patient's examination had not changed significantly from 2 years ago.  Reassured her that I do not see any evidence of Parkinson's disease today, although that certainly does not mean that she could not get this in the future.    -We discussed medications.  Ultimately, the patient decided that she did not want any medication.    Total time spent on today's visit was 35 minutes, including both face-to-face time and nonface-to-face time.  Time included that spent on review of records (prior notes available to me/labs/imaging if pertinent), discussing treatment and goals, answering patient's questions and coordinating care.  Cc:  Binnie Rail, MD

## 2020-01-16 ENCOUNTER — Encounter: Payer: Self-pay | Admitting: Neurology

## 2020-01-16 ENCOUNTER — Other Ambulatory Visit: Payer: Self-pay

## 2020-01-16 ENCOUNTER — Ambulatory Visit: Payer: Medicare Other | Admitting: Neurology

## 2020-01-16 VITALS — BP 102/68 | HR 72 | Ht 67.0 in | Wt 172.6 lb

## 2020-01-16 DIAGNOSIS — G250 Essential tremor: Secondary | ICD-10-CM

## 2020-01-16 NOTE — Patient Instructions (Addendum)
It was good to see you today!  The physicians and staff at [facility] are committed to providing excellent care. You may receive a survey requesting feedback about your experience at our office. We strive to receive "very good" responses to the survey questions. If you feel that your experience would prevent you from giving the office a "very good " response, please contact our office to try to remedy the situation. We may be reached at [phone]. Thank you for taking the time out of your busy day to complete the survey.  

## 2020-01-20 ENCOUNTER — Other Ambulatory Visit
Admission: RE | Admit: 2020-01-20 | Discharge: 2020-01-20 | Disposition: A | Discharge status: Home / Self Care | Payer: Medicare Other | Source: Ambulatory Visit

## 2020-01-20 ENCOUNTER — Other Ambulatory Visit
Admission: RE | Admit: 2020-01-20 | Discharge: 2020-01-20 | Disposition: A | Discharge status: Home / Self Care | Payer: Medicare Other | Source: Ambulatory Visit | Attending: Primary Care | Admitting: Primary Care

## 2020-01-20 DIAGNOSIS — M810 Age-related osteoporosis without current pathological fracture: Secondary | ICD-10-CM | POA: Insufficient documentation

## 2020-01-20 DIAGNOSIS — G40909 Epilepsy, unspecified, not intractable, without status epilepticus: Secondary | ICD-10-CM | POA: Insufficient documentation

## 2020-01-20 DIAGNOSIS — E7800 Pure hypercholesterolemia, unspecified: Secondary | ICD-10-CM | POA: Insufficient documentation

## 2020-01-20 DIAGNOSIS — E034 Atrophy of thyroid (acquired): Secondary | ICD-10-CM | POA: Insufficient documentation

## 2020-01-20 LAB — COMPREHENSIVE METABOLIC PANEL
ALT: 33 U/L (ref 0–35)
AST: 22 U/L (ref 0–35)
Albumin: 4.9 g/dL (ref 3.5–5.2)
Alk Phos: 93 U/L (ref 35–105)
Anion Gap: 8 (ref 7–16)
Bilirubin,Total: 0.3 mg/dL (ref 0.0–1.2)
CO2: 32 mmol/L — ABNORMAL HIGH (ref 20–28)
Calcium: 10.2 mg/dL (ref 8.6–10.2)
Chloride: 97 mmol/L (ref 96–108)
Creatinine: 0.6 mg/dL (ref 0.51–0.95)
GFR,Black: 110 *
GFR,Caucasian: 95 *
Glucose: 96 mg/dL (ref 60–99)
Lab: 20 mg/dL (ref 6–20)
Potassium: 4.3 mmol/L (ref 3.3–5.1)
Sodium: 137 mmol/L (ref 133–145)
Total Protein: 7.5 g/dL (ref 6.3–7.7)

## 2020-01-20 LAB — BASIC METABOLIC PANEL
Anion Gap: 10 (ref 7–16)
CO2: 31 mmol/L — ABNORMAL HIGH (ref 20–28)
Calcium: 10.3 mg/dL — ABNORMAL HIGH (ref 8.6–10.2)
Chloride: 97 mmol/L (ref 96–108)
Creatinine: 0.61 mg/dL (ref 0.51–0.95)
GFR,Black: 109 *
GFR,Caucasian: 95 *
Glucose: 95 mg/dL (ref 60–99)
Lab: 20 mg/dL (ref 6–20)
Potassium: 4.3 mmol/L (ref 3.3–5.1)
Sodium: 138 mmol/L (ref 133–145)

## 2020-01-20 LAB — CBC
Hematocrit: 42 % (ref 34–45)
Hemoglobin: 13.1 g/dL (ref 11.2–15.7)
MCH: 32 pg/cell (ref 26–32)
MCHC: 31 g/dL — ABNORMAL LOW (ref 32–36)
MCV: 101 fL — ABNORMAL HIGH (ref 79–95)
Platelets: 297 10*3/uL (ref 160–370)
RBC: 4.1 MIL/uL (ref 3.9–5.2)
RDW: 12.7 % (ref 11.7–14.4)
WBC: 5 10*3/uL (ref 4.0–10.0)

## 2020-01-20 LAB — LIPID PANEL
Chol/HDL Ratio: 2
Cholesterol: 204 mg/dL — AB
HDL: 100 mg/dL — ABNORMAL HIGH (ref 40–60)
LDL Calculated: 85 mg/dL
Non HDL Cholesterol: 104 mg/dL
Triglycerides: 97 mg/dL

## 2020-01-20 LAB — VITAMIN D: 25-OH Vit Total: 53 ng/mL (ref 30–60)

## 2020-01-20 LAB — MAGNESIUM: Magnesium: 2.2 mg/dL (ref 1.6–2.5)

## 2020-01-20 LAB — TSH: TSH: 1.94 u[IU]/mL (ref 0.27–4.20)

## 2020-01-22 LAB — CARBAMAZEPINE, FREE & TOTAL
Carbamazepine, Free: 17.6 % (ref 8.0–35.0)
Carbamazepine,Free: 1.6 ug/mL (ref 1.0–3.0)
Carbamazepine,Total: 9.1 ug/mL (ref 4.0–12.0)

## 2020-02-02 ENCOUNTER — Ambulatory Visit: Payer: Medicare Other | Admitting: Primary Care

## 2020-02-02 ENCOUNTER — Ambulatory Visit: Payer: Medicare Other | Attending: Primary Care | Admitting: Primary Care

## 2020-02-02 ENCOUNTER — Encounter: Payer: Self-pay | Admitting: Primary Care

## 2020-02-02 DIAGNOSIS — I10 Essential (primary) hypertension: Secondary | ICD-10-CM | POA: Insufficient documentation

## 2020-02-02 DIAGNOSIS — K219 Gastro-esophageal reflux disease without esophagitis: Secondary | ICD-10-CM | POA: Insufficient documentation

## 2020-02-02 DIAGNOSIS — N3941 Urge incontinence: Secondary | ICD-10-CM | POA: Insufficient documentation

## 2020-02-02 DIAGNOSIS — E034 Atrophy of thyroid (acquired): Secondary | ICD-10-CM

## 2020-02-02 DIAGNOSIS — E7800 Pure hypercholesterolemia, unspecified: Secondary | ICD-10-CM | POA: Insufficient documentation

## 2020-02-02 MED ORDER — PSEUDOEPHEDRINE HCL 30 MG PO TABS *I*
30.0000 mg | ORAL_TABLET | ORAL | 1 refills | Status: DC | PRN
Start: 2020-02-02 — End: 2020-07-23

## 2020-02-02 MED ORDER — DARIFENACIN HYDROBROMIDE 15 MG PO TB24 *A*
15.0000 mg | ORAL_TABLET | Freq: Every day | ORAL | 0 refills | Status: DC
Start: 2020-02-02 — End: 2020-05-05

## 2020-02-02 NOTE — Progress Notes (Signed)
Video Visit     Location of Patient: home    Location of Telemedicine Provider: hospital / clinical location    Other participants in telemedicine encounter and roles:  husband, Pilar Plate    This is an established patient visit.    Reason for visit: Gastrophageal Reflux and Results      HPI     Her biggest concern is the urinary frequency  particurly worse at night, interrupts sleep significantly   Also has to go frequently throughout the day as well and is a significant interruption to her daily activities  darifenacin has worked better for her but she had to change to solifenacin d/t formulary issues  Will trial the darifenacin again to see if insurance would cover since she has tried the solifenacin and it has not been effective  Could consider urology or urogyn referral to discuss other options in the future if insurance denies    Hyperlipidemia--LDL at goal 100 on atorvastatin, she continues to tolerate this well  HTN--remains on amlodipine low dose 2.53m daily  Denies CP or SOB  Hypothyroidism--TSH is wnl on current dose of levothyroxine    GERD--continues to be an issue despite pepcid bid and low dose aciphex and careful antireflux measures    ROS  Denies f/c/cough    Patient's problem list, allergies, and medications were reviewed and updated as appropriate.  Please see the EHR for full details.    Exam and data reviewed:  Results for JJORETTA, EADS(MRN [id number] as of 02/15/2020 21:24   Ref. Range 01/20/2020 09:40   Sodium Latest Ref Range: 133 - 145 mmol/L 137   Potassium Latest Ref Range: 3.3 - 5.1 mmol/L 4.3   Chloride Latest Ref Range: 96 - 108 mmol/L 97   CO2 Latest Ref Range: 20 - 28 mmol/L 32 (H)   Anion Gap Latest Ref Range: 7 - 16  8   UN Latest Ref Range: 6 - 20 mg/dL 20   Creatinine Latest Ref Range: 0.51 - 0.95 mg/dL 0.60   GFR,Black Latest Units: * 110   GFR,Caucasian Latest Units: * 95   Glucose Latest Ref Range: 60 - 99 mg/dL 96   Calcium Latest Ref Range: 8.6 - 10.2 mg/dL 10.2   Magnesium Latest  Ref Range: 1.6 - 2.5 mg/dL 2.2   Total Protein Latest Ref Range: 6.3 - 7.7 g/dL 7.5   Albumin Latest Ref Range: 3.5 - 5.2 g/dL 4.9   ALT Latest Ref Range: 0 - 35 U/L 33   AST Latest Ref Range: 0 - 35 U/L 22   Alk Phos Latest Ref Range: 35 - 105 U/L 93   Bilirubin,Total Latest Ref Range: 0.0 - 1.2 mg/dL 0.3   Cholesterol Latest Units: mg/dL 204 (!)   Triglycerides Latest Units: mg/dL 97   HDL Cholesterol Latest Ref Range: 40 - 60 mg/dL 100 (H)   LDL Calculated Latest Units: mg/dL 85   Non HDL Cholesterol Latest Units: mg/dL 104   Chol/HDL Ratio Unknown 2.0   25-OH Vit Total Latest Ref Range: 30 - 60 ng/mL 53   TSH Latest Ref Range: 0.27 - 4.20 uIU/mL 1.94   WBC Latest Ref Range: 4.0 - 10.0 THOU/uL 5.0   RBC Latest Ref Range: 3.90 - 5.20 MIL/uL 4.1   Hemoglobin Latest Ref Range: 11.2 - 15.7 g/dL 13.1   Hematocrit Latest Ref Range: 34.00 - 45.00 % 42   MCV Latest Ref Range: 79.0 - 95.0 fL 101 (H)   MCH Latest Ref Range:  26 - 32 pg/cell 32   MCHC Latest Ref Range: 32 - 36 g/dL 31 (L)   RDW Latest Ref Range: 11.7 - 14.4 % 12.7   Platelets Latest Ref Range: 160 - 370 THOU/uL 297   Carbamazipine,Free Latest Ref Range: 1.0 - 3.0 ug/mL 1.6   Carbamazepine, Free Latest Ref Range: 8.0 - 35.0 % 17.6   Carbamazepine,Total Latest Ref Range: 4.0 - 12.0 ug/mL 9.1       Assessment & Plan:  1.  Urinary urgency and frequency--will try again to get coverage for darifenacin which was more effective for her in the past  She is already following bladder training techniques  Would consider urology or urogyn referral in the future to explore other options  2. GERD--continues to have breakthrough sx on aciphex and famotidine  Consider referral back to GI for EGD and consideration of carafate , although timing that with other medications would be difficult  3. HTN--continue amlodipine, intermittent home readings to monitor   4. Osteoporosis--continue calcium, vitamin d , prolia and regular weight bearing exercise   Due this year for  dexa  Questions regarding covid answered  40 minutes personally spent on the date of the encounter including face to face pre and post visit work    Consent was obtained from the patient to complete this video visit; including the potential for financial liability.      Patient Active Problem List   Diagnosis Code    Reported Trauma Neck     Allergic rhinitis J30.9    Irritable bowel syndrome K58.9    Essential hypertension I10    Dysplastic Nevus I78.1    Rosacea L71.9    Osteoporosis M81.0    Hypothyroidism E03.9    Scoliosis M41.20    Urge Incontinence Of Urine N39.41    A-V Malformation Repair Supratentorial Complex     Seizure disorder G40.909    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    Ringing In The Ears (Tinnitus) H93.19    GERD (gastroesophageal reflux disease) K21.9    Pain in joint, ankle and foot M25.579    Weakness of foot R29.898    Hyperlipidemia E78.5     Current Outpatient Medications   Medication    atorvastatin (LIPITOR) 10 MG tablet    pseudoephedrine (CVS 12 HOUR NASAL DECONGESTANT) 120 MG 12 hr tablet    darifenacin (ENABLEX) 15 MG 24 hr tablet    EPINEPHrine (EPIPEN) 0.3 mg/0.3 mL auto-injector    carBAMazepine (TEGRETOL XR) 200 MG 12 hr tablet    CVS NASAL DECONGESTANT 30 MG tablet    carBAMazepine (CARBATROL) 100 MG 12 hr capsule    amLODIPine (NORVASC) 2.5 MG tablet    levothyroxine (SYNTHROID, LEVOTHROID) 25 MCG tablet    beclomethasone (QNASL) 80 MCG/ACT nasal spray    famotidine (PEPCID) 20 MG tablet    azelastine (ASTELIN) 0.1 % nasal spray    triamcinolone (KENALOG) 0.1 % ointment    fluticasone (FLOVENT DISKUS) 250 MCG/BLIST diskus inhaler    generic DME    Non-System Medication    Calcium Carb-Cholecalciferol 600-200 MG-UNIT TABS    Denosumab (PROLIA SC)    loratadine (CLARITIN) 10 MG tablet    Multiple Vitamin (MULTIVITAMIN) per tablet    cholecalciferol (VITAMIN D) 1000 UNITS capsule     No current facility-administered medications for  this visit.

## 2020-02-02 NOTE — Progress Notes (Signed)
See other note

## 2020-02-04 ENCOUNTER — Encounter: Payer: Self-pay | Admitting: Primary Care

## 2020-02-04 MED ORDER — RABEPRAZOLE SODIUM 20 MG PO TBEC *I*
20.0000 mg | DELAYED_RELEASE_TABLET | Freq: Every day | ORAL | 3 refills | Status: DC
Start: 2020-02-04 — End: 2020-04-21

## 2020-02-13 DIAGNOSIS — M85852 Other specified disorders of bone density and structure, left thigh: Secondary | ICD-10-CM | POA: Diagnosis not present

## 2020-02-13 DIAGNOSIS — Z1231 Encounter for screening mammogram for malignant neoplasm of breast: Secondary | ICD-10-CM | POA: Diagnosis not present

## 2020-02-13 DIAGNOSIS — M85851 Other specified disorders of bone density and structure, right thigh: Secondary | ICD-10-CM | POA: Diagnosis not present

## 2020-02-13 LAB — HM DEXA SCAN

## 2020-02-18 ENCOUNTER — Encounter: Payer: Self-pay | Admitting: Internal Medicine

## 2020-03-10 ENCOUNTER — Other Ambulatory Visit: Payer: Self-pay | Admitting: Gastroenterology

## 2020-03-10 LAB — HM DEXA SCAN

## 2020-03-16 ENCOUNTER — Encounter: Payer: Self-pay | Admitting: Primary Care

## 2020-03-31 ENCOUNTER — Other Ambulatory Visit: Payer: Self-pay | Admitting: Primary Care

## 2020-04-05 ENCOUNTER — Other Ambulatory Visit: Payer: Self-pay | Admitting: Primary Care

## 2020-04-05 ENCOUNTER — Other Ambulatory Visit: Payer: Self-pay | Admitting: Internal Medicine

## 2020-04-06 MED ORDER — AZELASTINE HCL 137 MCG/SPRAY NA SOLN *I*
1.0000 | Freq: Two times a day (BID) | NASAL | 1 refills | Status: DC
Start: 2020-04-06 — End: 2020-10-08

## 2020-04-07 ENCOUNTER — Ambulatory Visit: Payer: Medicare Other | Attending: Primary Care | Admitting: Primary Care

## 2020-04-07 VITALS — Temp 98.2°F

## 2020-04-07 DIAGNOSIS — Z20828 Contact with and (suspected) exposure to other viral communicable diseases: Secondary | ICD-10-CM | POA: Insufficient documentation

## 2020-04-07 DIAGNOSIS — B9689 Other specified bacterial agents as the cause of diseases classified elsewhere: Secondary | ICD-10-CM | POA: Insufficient documentation

## 2020-04-07 DIAGNOSIS — Z7189 Other specified counseling: Secondary | ICD-10-CM | POA: Insufficient documentation

## 2020-04-07 DIAGNOSIS — R0981 Nasal congestion: Secondary | ICD-10-CM | POA: Insufficient documentation

## 2020-04-07 DIAGNOSIS — J019 Acute sinusitis, unspecified: Secondary | ICD-10-CM | POA: Insufficient documentation

## 2020-04-07 DIAGNOSIS — Z20822 Contact with and (suspected) exposure to covid-19: Secondary | ICD-10-CM | POA: Insufficient documentation

## 2020-04-07 LAB — COVID-19 NAAT (PCR): COVID-19 NAAT (PCR): NEGATIVE

## 2020-04-07 LAB — COVID-19 PCR

## 2020-04-07 MED ORDER — DOXYCYCLINE HYCLATE 100 MG PO TABS *I*
100.0000 mg | ORAL_TABLET | Freq: Two times a day (BID) | ORAL | 1 refills | Status: DC
Start: 2020-04-07 — End: 2020-05-05

## 2020-04-07 NOTE — Progress Notes (Signed)
Reason for Visit: Nasal Congestion    Two days ago, she started with sinus congestion and head fullness  Post nasal drip  Throat clearing, but not sore throat  Feels like laying down  No body aches, f/c/sweats  No loss of taste or smell  No apparent changes in bowel habits  No SOB with exertion  No wheezing or coughing    Patient Active Problem List   Diagnosis Code    Reported Trauma Neck     Allergic rhinitis J30.9    Irritable bowel syndrome K58.9    Essential hypertension I10    Dysplastic Nevus I78.1    Rosacea L71.9    Osteoporosis M81.0    Hypothyroidism E03.9    Scoliosis M41.20    Urge Incontinence Of Urine N39.41    A-V Malformation Repair Supratentorial Complex     Seizure disorder G40.909    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    Ringing In The Ears (Tinnitus) H93.19    GERD (gastroesophageal reflux disease) K21.9    Pain in joint, ankle and foot M25.579    Weakness of foot R29.898    Hyperlipidemia E78.5     Current Outpatient Medications   Medication    azelastine (ASTELIN) 0.1 % nasal spray    RABEprazole (ACIPHEX) 20 MG tablet    solifenacin (VESICARE) 10 MG tablet    darifenacin (ENABLEX) 15 MG 24 hr tablet    pseudoephedrine (SUDAFED) 30 MG tablet    atorvastatin (LIPITOR) 10 MG tablet    pseudoephedrine (CVS 12 HOUR NASAL DECONGESTANT) 120 MG 12 hr tablet    darifenacin (ENABLEX) 15 MG 24 hr tablet    EPINEPHrine (EPIPEN) 0.3 mg/0.3 mL auto-injector    carBAMazepine (TEGRETOL XR) 200 MG 12 hr tablet    CVS NASAL DECONGESTANT 30 MG tablet    carBAMazepine (CARBATROL) 100 MG 12 hr capsule    amLODIPine (NORVASC) 2.5 MG tablet    levothyroxine (SYNTHROID, LEVOTHROID) 25 MCG tablet    beclomethasone (QNASL) 80 MCG/ACT nasal spray    famotidine (PEPCID) 20 MG tablet    triamcinolone (KENALOG) 0.1 % ointment    fluticasone (FLOVENT DISKUS) 250 MCG/BLIST diskus inhaler    generic DME    Non-System Medication    Calcium Carb-Cholecalciferol 600-200 MG-UNIT  TABS    Denosumab (PROLIA SC)    loratadine (CLARITIN) 10 MG tablet    Multiple Vitamin (MULTIVITAMIN) per tablet    cholecalciferol (VITAMIN D) 1000 UNITS capsule     No current facility-administered medications for this visit.       Medication list reviewed and updated, changes were made today    Exam: Temp 36.8 C (98.2 F)   Non toxic appearing 64 yo   TMs without erythema or exudate, there is evidence of serous effusion, but no loss of light reflex  Neck is supple wtihout lad, no supraclavicular adenopathy  Nasal mucosa is erythematous and raw appearing, no frank exudate  Oral mucosa moist without posterior pharyngeal cobblestoning  Lungs are CTA bilaterally, resp easy, no wheezes rales or rhonchi    A/P:  1. Sinusitis--appears more consistent with her typical sinusitis, but a covid swab was obtained   Doxycycline was initiated   Would advise nasal saline along with her qnasl, astelin and prn sudafed  2.  We did also spend time discussing her upcoming visit with her son and protocols for his visit  Based on updated CDC guidelines, it is ultimately okay if they choose to remain unmasked in the  house given that they are all vaccinated.  However, it sounds as though they would all be more comfortable remaining masked indoors, taking them off at bedtime as they are sleeping in different rooms, and sitting distanced for meals  If he is attending large sporting events prior to coming home I may recommend a test for safety, otherwise, I do not feel testing is necessary  I also advised that it is okay to have house cleaners in the home (vaccinated, masked)  Okay to begin socializing outdoors with friends and family  One hour was spent on the date of encounter including face to face pre and post visit work, the majority of which was counseling with regard to CDC guideline changes and how this will affect what she and her husband can do in light of their risk factors and vaccination status.

## 2020-04-08 ENCOUNTER — Encounter: Payer: Self-pay | Admitting: Primary Care

## 2020-04-18 ENCOUNTER — Other Ambulatory Visit: Payer: Self-pay | Admitting: Primary Care

## 2020-04-21 ENCOUNTER — Other Ambulatory Visit: Payer: Self-pay | Admitting: Primary Care

## 2020-04-21 MED ORDER — RABEPRAZOLE SODIUM 20 MG PO TBEC *I*
20.0000 mg | DELAYED_RELEASE_TABLET | Freq: Every day | ORAL | 3 refills | Status: DC
Start: 2020-04-21 — End: 2021-04-21

## 2020-05-05 ENCOUNTER — Encounter: Payer: Self-pay | Admitting: Primary Care

## 2020-05-05 ENCOUNTER — Ambulatory Visit: Payer: Medicare Other | Attending: Primary Care | Admitting: Primary Care

## 2020-05-05 VITALS — BP 128/82 | HR 68 | Wt 109.4 lb

## 2020-05-05 DIAGNOSIS — E034 Atrophy of thyroid (acquired): Secondary | ICD-10-CM | POA: Insufficient documentation

## 2020-05-05 DIAGNOSIS — I10 Essential (primary) hypertension: Secondary | ICD-10-CM | POA: Insufficient documentation

## 2020-05-05 DIAGNOSIS — K219 Gastro-esophageal reflux disease without esophagitis: Secondary | ICD-10-CM

## 2020-05-05 DIAGNOSIS — N3941 Urge incontinence: Secondary | ICD-10-CM | POA: Insufficient documentation

## 2020-05-05 DIAGNOSIS — E7800 Pure hypercholesterolemia, unspecified: Secondary | ICD-10-CM | POA: Insufficient documentation

## 2020-05-05 DIAGNOSIS — G40909 Epilepsy, unspecified, not intractable, without status epilepticus: Secondary | ICD-10-CM | POA: Insufficient documentation

## 2020-05-05 DIAGNOSIS — E559 Vitamin D deficiency, unspecified: Secondary | ICD-10-CM

## 2020-05-05 NOTE — Progress Notes (Signed)
Reason for Visit: Hypertension    The last time she took took the doxycycline  Day 7-8 started with acute abdominal pain, cramping  She was able to complete it and her sinus symptoms improved  In the future will take probiotics and hopefully this will allow her to continue to use that for sinus infections, given that she has so many other antibiotic allergies and sensitivities    She hurt her right index finger   Has had cold laser therapy on her finger for the last ten days, with the chiropracter  It has been limiting her activities quite significantly  Had already taken oral nsaids early on    There is a standing desk on ball bearings in their bedroom   She tripped over it last night   Her husband is now going to make sure that it is pushed to the side as she gets up often through the night.  They do already have a light on for her to watch where she is going when she gets up   She also slipped in the bathroom; there was an area on the floor that is slippery.  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    Ringing In The Ears (Tinnitus) H93.19    GERD (gastroesophageal reflux disease) K21.9    Pain in joint, ankle and foot M25.579    Weakness of foot R29.898    Hyperlipidemia E78.5     Current Outpatient Medications   Medication    RABEprazole (ACIPHEX) 20 MG tablet    azelastine (ASTELIN) 0.1 % nasal spray    solifenacin  (VESICARE) 10 MG tablet    pseudoephedrine (SUDAFED) 30 MG tablet    atorvastatin (LIPITOR) 10 MG tablet    pseudoephedrine ([employer]S 12 HOUR NASAL DECONGESTANT) 120 MG 12 hr tablet    darifenacin (ENABLEX) 15 MG 24 hr tablet    EPINEPHrine (EPIPEN) 0.3 mg/0.3 mL auto-injector    carBAMazepine (TEGRETOL XR) 200 MG 12 hr tablet    CVS NASAL DECONGESTANT 30 MG tablet    carBAMazepine (CARBATROL) 100 MG 12 hr capsule    amLODIPine (NORVASC) 2.5 MG tablet    levothyroxine (SYNTHROID, LEVOTHROID) 25 MCG tablet    beclomethasone (QNASL) 80 MCG/ACT nasal spray    famotidine (PEPCID) 20 MG tablet    triamcinolone (KENALOG) 0.1 % ointment    fluticasone (FLOVENT DISKUS) 250 MCG/BLIST diskus inhaler    generic DME    Non-System Medication    Calcium Carb-Cholecalciferol 600-200 MG-UNIT TABS    Denosumab (PROLIA SC)    loratadine (CLARITIN) 10 MG tablet    Multiple Vitamin (MULTIVITAMIN) per tablet    cholecalciferol (VITAMIN D) 1000 UNITS capsule     No current facility-administered medications for this visit.       Medication list reviewed  and updated, no changes were made today    Exam: BP 128/82    Pulse 68    Wt 49.6 kg (109 lb 6.4 oz)    BMI 19.38 kg/m   Well-nourished alert and conversant, no acute distress.  Neck is supple without lad, lungs are CTA bilaterally, resp easy.  Cardiac exam reveals nl S1S2 RRR.  Abdomen is soft, nontender, normoactive BS appreciated.  No organomegaly noted.  Extremities reveal 2+DP and PT pulses and no edema. Right index finger with bony enlargement at the PIP and also welling between PIP and DIP, no locking or clicking    A/P:  Vaneta was seen today for hypertension.    Diagnoses and all orders for this visit:  Right index finger injury with inflammatory reaction --temporarily used NSAIDS orally, but okay to use topical nsaids in the longer term as needed   OTC voltaren gel 2gm up to qid prn   Continue to work with the chiropractor    Essential hypertension--well  controlled on amlodipine     Hypothyroidism due to acquired atrophy of thyroid--will check TSH prior to next visit and adjust levothyroxine accordingly  -     TSH; Future    Gastroesophageal reflux disease, unspecified whether esophagitis present--    Pure hypercholesterolemia--check lipid panel prior to next visit, continue low dose atorvastatin  -     Comprehensive metabolic panel; Future  -     Lipid Panel (Reflex to Direct  LDL if Triglycerides more than 400); Future    Seizure disorder-continue to monitor carbamazepine levels, plan to check prior to next visit  -     CBC; Future  -     Carbamazepine, free & total; Future    Fall risk and osteoporosis--remains on prolia, continues calcium, vitamin d and weight bearing exercise  Continue mindful exercise and home modifications to reduce likelihood of falls.   -     Vitamin D; Future    Urinary urgency and frequency--she is already doing conservative measures , avoiding bladder irritants and is taking vesicare; with consideration in future of urogyn referral if sx become unmanageable.    32 min personally spent on the date of the encounter including face to face, pre and post visit work

## 2020-05-08 ENCOUNTER — Encounter: Payer: Self-pay | Admitting: Primary Care

## 2020-05-19 DIAGNOSIS — I480 Paroxysmal atrial fibrillation: Secondary | ICD-10-CM | POA: Diagnosis not present

## 2020-05-19 DIAGNOSIS — R002 Palpitations: Secondary | ICD-10-CM | POA: Diagnosis not present

## 2020-05-19 DIAGNOSIS — I10 Essential (primary) hypertension: Secondary | ICD-10-CM | POA: Diagnosis not present

## 2020-05-19 DIAGNOSIS — R0600 Dyspnea, unspecified: Secondary | ICD-10-CM | POA: Diagnosis not present

## 2020-05-19 DIAGNOSIS — E785 Hyperlipidemia, unspecified: Secondary | ICD-10-CM | POA: Diagnosis not present

## 2020-05-20 DIAGNOSIS — R002 Palpitations: Secondary | ICD-10-CM | POA: Diagnosis not present

## 2020-05-26 DIAGNOSIS — Z9889 Other specified postprocedural states: Secondary | ICD-10-CM | POA: Diagnosis not present

## 2020-05-26 DIAGNOSIS — R0689 Other abnormalities of breathing: Secondary | ICD-10-CM | POA: Diagnosis not present

## 2020-05-26 DIAGNOSIS — I081 Rheumatic disorders of both mitral and tricuspid valves: Secondary | ICD-10-CM | POA: Diagnosis not present

## 2020-05-28 ENCOUNTER — Other Ambulatory Visit: Payer: Self-pay | Admitting: Primary Care

## 2020-05-30 DIAGNOSIS — R002 Palpitations: Secondary | ICD-10-CM | POA: Diagnosis not present

## 2020-06-01 DIAGNOSIS — I480 Paroxysmal atrial fibrillation: Secondary | ICD-10-CM | POA: Diagnosis not present

## 2020-06-01 DIAGNOSIS — I341 Nonrheumatic mitral (valve) prolapse: Secondary | ICD-10-CM | POA: Diagnosis not present

## 2020-06-01 DIAGNOSIS — E785 Hyperlipidemia, unspecified: Secondary | ICD-10-CM | POA: Diagnosis not present

## 2020-06-01 DIAGNOSIS — I10 Essential (primary) hypertension: Secondary | ICD-10-CM | POA: Diagnosis not present

## 2020-06-01 DIAGNOSIS — R002 Palpitations: Secondary | ICD-10-CM | POA: Diagnosis not present

## 2020-06-02 NOTE — Telephone Encounter (Signed)
Spoke with pt, she agreed to 8/24 1:20p

## 2020-06-12 DIAGNOSIS — N3001 Acute cystitis with hematuria: Secondary | ICD-10-CM | POA: Diagnosis not present

## 2020-06-17 ENCOUNTER — Other Ambulatory Visit: Payer: Self-pay | Admitting: Primary Care

## 2020-06-23 ENCOUNTER — Other Ambulatory Visit: Payer: Self-pay | Admitting: Primary Care

## 2020-06-23 MED ORDER — PSEUDOEPHEDRINE HCL 120 MG PO TB12 *I*
120.0000 mg | ORAL_TABLET | Freq: Two times a day (BID) | ORAL | 4 refills | Status: DC
Start: 2020-06-23 — End: 2021-03-22

## 2020-06-28 ENCOUNTER — Other Ambulatory Visit: Payer: Self-pay | Admitting: Primary Care

## 2020-06-28 MED ORDER — PSEUDOEPHEDRINE HCL 30 MG PO TABS *I*
30.0000 mg | ORAL_TABLET | ORAL | 5 refills | Status: DC | PRN
Start: 2020-06-28 — End: 2020-07-23

## 2020-07-02 ENCOUNTER — Other Ambulatory Visit: Payer: Self-pay | Admitting: Primary Care

## 2020-07-02 MED ORDER — TRIAMCINOLONE ACETONIDE 0.1 % EX OINT *I*
TOPICAL_OINTMENT | Freq: Two times a day (BID) | CUTANEOUS | 0 refills | Status: DC
Start: 2020-07-02 — End: 2023-10-05

## 2020-07-06 ENCOUNTER — Ambulatory Visit: Payer: Medicare Other | Attending: Neurology | Admitting: Neurology

## 2020-07-06 ENCOUNTER — Encounter: Payer: Self-pay | Admitting: Neurology

## 2020-07-06 VITALS — BP 118/82 | HR 77 | Temp 98.4°F | Wt 108.2 lb

## 2020-07-06 DIAGNOSIS — J329 Chronic sinusitis, unspecified: Secondary | ICD-10-CM

## 2020-07-06 DIAGNOSIS — R05 Cough: Secondary | ICD-10-CM

## 2020-07-06 DIAGNOSIS — R059 Cough, unspecified: Secondary | ICD-10-CM

## 2020-07-06 DIAGNOSIS — Z20828 Contact with and (suspected) exposure to other viral communicable diseases: Secondary | ICD-10-CM | POA: Insufficient documentation

## 2020-07-06 DIAGNOSIS — J019 Acute sinusitis, unspecified: Secondary | ICD-10-CM

## 2020-07-06 DIAGNOSIS — B9689 Other specified bacterial agents as the cause of diseases classified elsewhere: Secondary | ICD-10-CM

## 2020-07-06 DIAGNOSIS — J310 Chronic rhinitis: Secondary | ICD-10-CM

## 2020-07-06 DIAGNOSIS — Z20822 Contact with and (suspected) exposure to covid-19: Secondary | ICD-10-CM | POA: Insufficient documentation

## 2020-07-06 LAB — COVID-19 NAAT (PCR): COVID-19 NAAT (PCR): NEGATIVE

## 2020-07-06 LAB — COVID-19 PCR

## 2020-07-06 MED ORDER — DOXYCYCLINE HYCLATE 100 MG PO TABS *I*
100.0000 mg | ORAL_TABLET | Freq: Two times a day (BID) | ORAL | 0 refills | Status: DC
Start: 2020-07-06 — End: 2021-03-18

## 2020-07-07 NOTE — Result QuickNote (Signed)
Pt aware.

## 2020-07-13 ENCOUNTER — Ambulatory Visit: Payer: Medicare Other | Admitting: Neurology

## 2020-07-13 ENCOUNTER — Encounter: Payer: Self-pay | Admitting: Internal Medicine

## 2020-07-13 MED ORDER — ZOLPIDEM TARTRATE 5 MG PO TABS: ORAL_TABLET | ORAL | 0 refills | Status: DC

## 2020-07-14 NOTE — Progress Notes (Signed)
REASON FOR VISIT:   sinusitis    SUBJECTIVE:   Presents with a 10-day history of sinus congestion, increased postnasal drip, bilateral ear fullness.  Scratchy throat, which she attributes to PND.  Headache.  Facial pressure.  Dry cough.  Increase in baseline fatigue.  Denies fever or chills.    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    Denies recent travel.  Denies known ill contacts.  COVID-19 vaccinated 12/09/2019, 01/09/2020.    OBJECTIVE:   Blood pressure 118/82, pulse 77, temperature 36.9 C (98.4 F), temperature source Temporal, weight 49.1 kg (108 lb 3.2 oz), SpO2 100 %.  Appears comfortable.  Sounds congested.  Ear canals clear.  TMs dull.  No erythema or effusion.  Frontal, periorbital and maxillary sinus tenderness.  Maxillary sinus transillumination diminished.  Posterior oropharynx slightly erythematous.  No cobblestoning or exudate.  Neck supple.  No lymphadenopathy.  Lung fields clear.  RRR.    ASSESSMENT & PLAN:      Acute exacerbation of chronic rhinosinusitis  Cough  -Covid 19 PCR NP culture obtained  -Doxycycline x10 days  -Continue chronic rhinosinusitis regime --> antihistamine, decongestant, steroid nasal spray, humidified air    ORDERS:     Orders Placed This Encounter   Procedures    COVID-19 PCR       FOLLOW-UP:   with Dr Cloyde Reams as scheduled    Medications at end of visit:   Current Outpatient Medications   Medication    doxycycline hyclate (VIBRA-TABS) 100 MG tablet    triamcinolone (KENALOG) 0.1 % ointment    pseudoephedrine (CVS NASAL DECONGESTANT) 30 MG tablet    pseudoephedrine (CVS 12 HOUR NASAL DECONGESTANT) 120 MG 12 hr tablet    solifenacin (VESICARE) 10 MG tablet    RABEprazole (ACIPHEX) 20 MG tablet    azelastine (ASTELIN) 0.1 % nasal spray    pseudoephedrine (SUDAFED) 30 MG tablet    atorvastatin (LIPITOR) 10 MG tablet    EPINEPHrine (EPIPEN) 0.3 mg/0.3 mL auto-injector     carBAMazepine (TEGRETOL XR) 200 MG 12 hr tablet    carBAMazepine (CARBATROL) 100 MG 12 hr capsule    amLODIPine (NORVASC) 2.5 MG tablet    levothyroxine (SYNTHROID, LEVOTHROID) 25 MCG tablet    beclomethasone (QNASL) 80 MCG/ACT nasal spray    famotidine (PEPCID) 20 MG tablet    fluticasone (FLOVENT DISKUS) 250 MCG/BLIST diskus inhaler    generic DME    Non-System Medication    Calcium Carb-Cholecalciferol 600-200 MG-UNIT TABS    Denosumab (PROLIA SC)    loratadine (CLARITIN) 10 MG tablet    Multiple Vitamin (MULTIVITAMIN) per tablet    cholecalciferol (VITAMIN D) 1000 UNITS capsule     No current facility-administered medications for this visit.

## 2020-07-15 ENCOUNTER — Ambulatory Visit: Payer: Medicare Other | Admitting: Neurology

## 2020-07-20 ENCOUNTER — Telehealth: Payer: Self-pay | Admitting: Neurology

## 2020-07-20 ENCOUNTER — Ambulatory Visit: Payer: Medicare Other | Admitting: Neurology

## 2020-07-20 NOTE — Telephone Encounter (Signed)
Patient calling in asking can today video @1pm  be tele health visit she states she can not make it to the office.      Please advise [phone]

## 2020-07-20 NOTE — Telephone Encounter (Signed)
Pt is calling waiting for you for video visit. Appt accidentally  got reschedule for in person for 1 pm. She wants to know can she still do video right now or should she reschedule.

## 2020-07-20 NOTE — Telephone Encounter (Signed)
Patient calling in again asking for the appt today at 1pm to be a video visit.    Please call back at [phone]

## 2020-07-21 ENCOUNTER — Ambulatory Visit: Payer: Medicare Other | Attending: Neurology | Admitting: Neurology

## 2020-07-21 DIAGNOSIS — G40109 Localization-related (focal) (partial) symptomatic epilepsy and epileptic syndromes with simple partial seizures, not intractable, without status epilepticus: Secondary | ICD-10-CM

## 2020-07-21 NOTE — Progress Notes (Signed)
Video Visit     Location of Patient: home    Location of Telemedicine Provider: hospital / clinical location    Other participants in telemedicine encounter and roles:  none    This is an established patient visit.    Reason for visit:seizure      I had the pleasure of seeing her today for follow-up of her history of arteriovenous malformation status post resection and residual seizure disorder.  Overall she has been doing well and continues to work primarily from home.  Both she and her husband are fairly strict about limiting exposures.  She is hoping to receive a third vaccination as well, along with her husband who is likely to be receiving his third for an immunocompromised state.    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  She is no longer seeing her regular physical therapist her personal trainer but does see her chiropractor every 2 weeks who has been helpful in keeping her more ambulatory.    She reports that an occasional word finding issue and some decline in her short-term memory.  None of these issues have interfered with her capacity to work.  Her mood is generally been good.    Review of systems is notable for recent sinus infection.  She is completing a course of doxycycline.    Examination:  She is alert and oriented.  Her speech is clear.  Comprehension is intact.  Affect is bright.    Facial strength appeared intact.  She has no focal upper extremity weakness.  She continues to have a mild sustention tremor which is perhaps a bit more evident on finger-nose-finger testing.  She reports occasional tremor of her chin but I did not visualize that today.    Impression:  Stable seizure disorder currently well controlled on Tegretol.  Her last level was 1.6 free.  She is scheduled for repeat testing soon.  We again talked about treatments for essential tremor but we will hold on those for now given the  potential side effects.  I plan on seeing her again in 6 months.      Consent was obtained from the patient to complete this video visit; including the potential for financial liability.          Evalee Mutton, MD

## 2020-07-23 ENCOUNTER — Other Ambulatory Visit: Payer: Self-pay | Admitting: Primary Care

## 2020-07-23 ENCOUNTER — Telehealth: Payer: Self-pay | Admitting: Internal Medicine

## 2020-07-23 MED ORDER — PSEUDOEPHEDRINE HCL 30 MG PO TABS *I*
30.0000 mg | ORAL_TABLET | ORAL | 1 refills | Status: DC | PRN
Start: 2020-07-23 — End: 2020-08-11

## 2020-07-23 NOTE — Progress Notes (Signed)
°  Chronic Care Management   Note  07/23/2020 Name: SHARECE FLEISCHHACKER MRN: [id number] DOB: [birth year]  ORIE CUTTINO is a 64 y.o. year old female who is a primary care patient of Quay Burow, Claudina Lick, MD. I reached out to [name] by phone today in response to a referral sent by Ms. Margarite Gouge Schissler's PCP, Binnie Rail, MD.   Ms. Pandolfi was given information about Chronic Care Management services today including:  1. CCM service includes personalized support from designated clinical staff supervised by her physician, including individualized plan of care and coordination with other care providers 2. 24/7 contact phone numbers for assistance for urgent and routine care needs. 3. Service will only be billed when office clinical staff spend 20 minutes or more in a month to coordinate care. 4. Only one practitioner may furnish and bill the service in a calendar month. 5. The patient may stop CCM services at any time (effective at the end of the month) by phone call to the office staff.   Patient agreed to services and verbal consent obtained.   Follow up plan:   Earney [facility] [employer] Scheduler

## 2020-07-29 ENCOUNTER — Telehealth: Payer: Self-pay | Admitting: Primary Care

## 2020-07-29 NOTE — Telephone Encounter (Signed)
Patient called and said she had her covid booster but has a mammogram coming up, per  Dr recommended patient push out 4-6 weeks after vaccine

## 2020-07-30 ENCOUNTER — Other Ambulatory Visit: Payer: Self-pay | Admitting: Primary Care

## 2020-08-09 ENCOUNTER — Telehealth: Payer: Self-pay | Admitting: Primary Care

## 2020-08-09 NOTE — Telephone Encounter (Signed)
When is she actually due? I think as long as it is two weeks out it should be fine

## 2020-08-09 NOTE — Telephone Encounter (Signed)
Wonders how far from her 3rd injection for covid can she get her Prolia shot?

## 2020-08-10 NOTE — Telephone Encounter (Signed)
Patient is due by the end of the month, and shes almost hit 3 weeks from her booster injection

## 2020-08-11 ENCOUNTER — Other Ambulatory Visit: Payer: Self-pay | Admitting: Primary Care

## 2020-08-12 ENCOUNTER — Other Ambulatory Visit
Admission: RE | Admit: 2020-08-12 | Discharge: 2020-08-12 | Disposition: A | Discharge status: Home / Self Care | Payer: Medicare Other | Source: Ambulatory Visit

## 2020-08-12 ENCOUNTER — Other Ambulatory Visit
Admission: RE | Admit: 2020-08-12 | Discharge: 2020-08-12 | Disposition: A | Discharge status: Home / Self Care | Payer: Medicare Other | Source: Ambulatory Visit | Attending: Primary Care | Admitting: Primary Care

## 2020-08-12 DIAGNOSIS — E7800 Pure hypercholesterolemia, unspecified: Secondary | ICD-10-CM | POA: Insufficient documentation

## 2020-08-12 DIAGNOSIS — G40909 Epilepsy, unspecified, not intractable, without status epilepticus: Secondary | ICD-10-CM | POA: Insufficient documentation

## 2020-08-12 DIAGNOSIS — M810 Age-related osteoporosis without current pathological fracture: Secondary | ICD-10-CM | POA: Insufficient documentation

## 2020-08-12 DIAGNOSIS — E034 Atrophy of thyroid (acquired): Secondary | ICD-10-CM | POA: Insufficient documentation

## 2020-08-12 DIAGNOSIS — E559 Vitamin D deficiency, unspecified: Secondary | ICD-10-CM | POA: Insufficient documentation

## 2020-08-12 LAB — COMPREHENSIVE METABOLIC PANEL
ALT: 27 U/L (ref 0–35)
AST: 20 U/L (ref 0–35)
Albumin: 4.4 g/dL (ref 3.5–5.2)
Alk Phos: 82 U/L (ref 35–105)
Anion Gap: 11 (ref 7–16)
Bilirubin,Total: 0.3 mg/dL (ref 0.0–1.2)
CO2: 26 mmol/L (ref 20–28)
Calcium: 9.6 mg/dL (ref 8.6–10.2)
Chloride: 101 mmol/L (ref 96–108)
Creatinine: 0.57 mg/dL (ref 0.51–0.95)
GFR,Black: 111 *
GFR,Caucasian: 96 *
Glucose: 93 mg/dL (ref 60–99)
Lab: 18 mg/dL (ref 6–20)
Potassium: 4.4 mmol/L (ref 3.3–5.1)
Sodium: 138 mmol/L (ref 133–145)
Total Protein: 6.8 g/dL (ref 6.3–7.7)

## 2020-08-12 LAB — CBC
Hematocrit: 39 % (ref 34–45)
Hemoglobin: 13 g/dL (ref 11.2–15.7)
MCH: 33 pg — ABNORMAL HIGH (ref 26–32)
MCHC: 34 g/dL (ref 32–36)
MCV: 99 fL — ABNORMAL HIGH (ref 79–95)
Platelets: 233 10*3/uL (ref 160–370)
RBC: 3.9 MIL/uL (ref 3.9–5.2)
RDW: 13.2 % (ref 11.7–14.4)
WBC: 4.7 10*3/uL (ref 4.0–10.0)

## 2020-08-12 LAB — BASIC METABOLIC PANEL
Anion Gap: 13 (ref 7–16)
CO2: 24 mmol/L (ref 20–28)
Calcium: 9.5 mg/dL (ref 8.6–10.2)
Chloride: 97 mmol/L (ref 96–108)
Creatinine: 0.57 mg/dL (ref 0.51–0.95)
GFR,Black: 111 *
GFR,Caucasian: 96 *
Glucose: 94 mg/dL (ref 60–99)
Lab: 18 mg/dL (ref 6–20)
Potassium: 4.3 mmol/L (ref 3.3–5.1)
Sodium: 134 mmol/L (ref 133–145)

## 2020-08-12 LAB — TSH: TSH: 2.71 u[IU]/mL (ref 0.27–4.20)

## 2020-08-12 LAB — LIPID PANEL
Chol/HDL Ratio: 2.1
Cholesterol: 188 mg/dL
HDL: 91 mg/dL — ABNORMAL HIGH (ref 40–60)
LDL Calculated: 80 mg/dL
Non HDL Cholesterol: 97 mg/dL
Triglycerides: 84 mg/dL

## 2020-08-12 LAB — MAGNESIUM: Magnesium: 2.1 mg/dL (ref 1.6–2.5)

## 2020-08-12 LAB — VITAMIN D: 25-OH Vit Total: 44 ng/mL (ref 30–60)

## 2020-08-14 LAB — CARBAMAZEPINE, FREE & TOTAL
Carbamazepine, Free: 22.2 % (ref 8.0–35.0)
Carbamazepine,Free: 2.4 ug/mL (ref 1.0–3.0)
Carbamazepine,Total: 10.8 ug/mL (ref 4.0–12.0)

## 2020-08-16 ENCOUNTER — Ambulatory Visit: Payer: Medicare Other | Admitting: Primary Care

## 2020-08-16 ENCOUNTER — Ambulatory Visit: Payer: Medicare Other | Attending: Primary Care | Admitting: Primary Care

## 2020-08-16 ENCOUNTER — Encounter: Payer: Self-pay | Admitting: Primary Care

## 2020-08-16 VITALS — BP 124/82 | HR 68 | Ht 62.99 in | Wt 110.0 lb

## 2020-08-16 DIAGNOSIS — Z0000 Encounter for general adult medical examination without abnormal findings: Secondary | ICD-10-CM | POA: Insufficient documentation

## 2020-08-16 DIAGNOSIS — J309 Allergic rhinitis, unspecified: Secondary | ICD-10-CM

## 2020-08-16 DIAGNOSIS — E7800 Pure hypercholesterolemia, unspecified: Secondary | ICD-10-CM | POA: Insufficient documentation

## 2020-08-16 DIAGNOSIS — M810 Age-related osteoporosis without current pathological fracture: Secondary | ICD-10-CM | POA: Insufficient documentation

## 2020-08-16 DIAGNOSIS — I10 Essential (primary) hypertension: Secondary | ICD-10-CM | POA: Insufficient documentation

## 2020-08-16 LAB — PCMH FALL RISK PLAN

## 2020-08-16 LAB — PCMH FALL RISK ASSESSMENT

## 2020-08-16 LAB — PCMH DEPRESSION ASSESSMENT

## 2020-08-16 NOTE — Patient Instructions (Signed)
Thank you for completing your Subsequent Annual Medicare Visit   with [place] today.     The purpose of this visits was to:     Screen for disease   Assess risk of future medical problems   Help develop a healthy lifestyle   Update vaccines   Get to know your doctor in case of an illness    Patient Care Team:  [employer] (Inactive) as PCP - CCP-AUDIOLOGY  Leeann Must, MD as PCP - General  Tyrell Antonio Darrel Reach, AUD as PCP - CCP-AUDIOLOGY     Medicare 5 Year Plan    The following items were identified as areas of concern during your screening today:  High Blood Pressure (Hypertension) - This is a risk factor for Heart Attack, Stroke, Kidney Problems and Eye Problems.       The Health Maintenance table below identifies screening tests and immunizations recommended by your health care team:  Health Maintenance: These screening recommendations are based on USPSTF, [employer], and [place] state guidelines   Topic Date Due    HIV Screening  Never done    Shingles Vaccine (2 of 3) 03/27/2014    Adult Prevnar Vaccination (1) Never done    Adult Pneumovax Vaccine (1) Never done    Flu Shot (1) 07/21/2020    Breast Cancer Screening  08/04/2020    DEPRESSION SCREEN YEARLY  08/16/2021    Fall Risk Screening  08/16/2021    Osteoporosis Screening  03/10/2022    Colon Cancer Screening  07/12/2022    Hepatitis C Screening  Completed    COVID-19 Vaccine  Completed     In addition, goals and orders placed to address these recommendations are listed in the "Today's Visit" section.    We wish you the best of health and look forward to seeing you again next year for your Annual Medicare Wellness Visit.     If you have any health care concerns before then, please do not hesitate to contact us.
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
## 2020-08-17 ENCOUNTER — Encounter: Payer: Self-pay | Admitting: Primary Care

## 2020-08-18 ENCOUNTER — Telehealth: Payer: Self-pay | Admitting: Interventional Cardiology

## 2020-08-18 ENCOUNTER — Telehealth: Payer: Self-pay | Admitting: Internal Medicine

## 2020-08-18 NOTE — Telephone Encounter (Signed)
Patient called and said that she has been feeling dizzy and has to lay down, she believes her bowels are impacted and when she last checked her BP it was 65/45. She said that this has been going on for 3 days. Sent to team health.

## 2020-08-18 NOTE — Telephone Encounter (Signed)
Patient called and really needs to speak with Dr. Tamala Julian or nurse regarding the medication Dr. Tamala Julian prescribed her. Had went to doctor due to having episodes and the doctor said that she should be seen within 4 hrs. Please call back

## 2020-08-18 NOTE — Telephone Encounter (Signed)
Spoke with pt and she states she has had a few "events" over the last 10 or so days.  States 30 minutes after taking medications in the morning she develops nausea, dizziness, SOB, and unsteady gates.  Sits or lies down and symptoms resolve after about 5 minutes.  Checked BP with recent episode and it was  65/45 and she was unable to hold her head up.  After resting for a bit, BP came up to 89/63 and she was able to sit up.  First time this event occurred she was trying to get to another room and was staggering and vomited.  Denies CP or sweating during episodes.  Scheduled pt to go to [facility] tomorrow and see Almyra Deforest, PA-C for evaluation.  Pt agreeable to plan.

## 2020-08-19 ENCOUNTER — Encounter: Payer: Self-pay | Admitting: Physician Assistant

## 2020-08-19 ENCOUNTER — Other Ambulatory Visit: Payer: Self-pay

## 2020-08-19 ENCOUNTER — Ambulatory Visit (INDEPENDENT_AMBULATORY_CARE_PROVIDER_SITE_OTHER): Payer: Medicare Other | Admitting: Physician Assistant

## 2020-08-19 VITALS — BP 132/74 | HR 73 | Ht 67.0 in | Wt 166.0 lb

## 2020-08-19 DIAGNOSIS — E7849 Other hyperlipidemia: Secondary | ICD-10-CM

## 2020-08-19 DIAGNOSIS — I480 Paroxysmal atrial fibrillation: Secondary | ICD-10-CM | POA: Diagnosis not present

## 2020-08-19 DIAGNOSIS — I10 Essential (primary) hypertension: Secondary | ICD-10-CM

## 2020-08-19 DIAGNOSIS — Z9889 Other specified postprocedural states: Secondary | ICD-10-CM | POA: Diagnosis not present

## 2020-08-19 DIAGNOSIS — R42 Dizziness and giddiness: Secondary | ICD-10-CM | POA: Diagnosis not present

## 2020-08-19 LAB — BASIC METABOLIC PANEL
BUN/Creatinine Ratio: 12 (ref 12–28)
BUN: 12 mg/dL (ref 8–27)
CO2: 26 mmol/L (ref 20–29)
Calcium: 10.3 mg/dL (ref 8.7–10.3)
Chloride: 100 mmol/L (ref 96–106)
Creatinine, Ser: 1 mg/dL (ref 0.57–1.00)
GFR calc Af Amer: 68 mL/min/{1.73_m2} (ref >59)
GFR calc non Af Amer: 59 mL/min/{1.73_m2} — ABNORMAL LOW (ref >59)
Glucose: 80 mg/dL (ref 65–99)
Potassium: 4.9 mmol/L (ref 3.5–5.2)
Sodium: 140 mmol/L (ref 134–144)

## 2020-08-19 LAB — CBC
Hematocrit: 38.9 % (ref 34.0–46.6)
Hemoglobin: 13.9 g/dL (ref 11.1–15.9)
MCH: 32.9 pg (ref 26.6–33.0)
MCHC: 35.7 g/dL (ref 31.5–35.7)
MCV: 92 fL (ref 79–97)
Platelets: 203 10*3/uL (ref 150–450)
RBC: 4.23 x10E6/uL (ref 3.77–5.28)
RDW: 12.2 % (ref 11.7–15.4)
WBC: 5.1 10*3/uL (ref 3.4–10.8)

## 2020-08-19 NOTE — Progress Notes (Signed)
Cardiology Office Note:    Date:  08/21/2020   ID:  Ariel Simpson, DOB 1956/06/05, MRN [id number]  PCP:  Binnie Rail, MD  [facility] Cardiologist:  Sinclair Grooms, MD/Dr. [facility] Electrophysiologist:  None   Referring MD: Binnie Rail, MD   Chief Complaint  Patient presents with  . Follow-up    seen for Dr. Tamala Julian    History of Present Illness:    Ariel Simpson is a 64 y.o. female with a hx of mitral valve prolapse and severe MR s/p mitral valve repair/annuloplasty in 2014, PAF s/p Maze procedure at the time of MVR, paroxysmal SVT, hypertension, and hyperlipidemia.  Cardiac catheterization prior to mitral valve repair in 2014 showed normal coronary arteries.  Echocardiogram performed in 2019 showed EF 55 to 60%, normal wall motion, well-functioning mitral valve repair with only trivial regurgitation, severely dilated RV with moderate TR and elevated PASP consistent with moderate pulmonary hypertension.  She split her time between [place] and [place].  He follows Dr. Charisse Klinefelter in [place] for her cardiac care while she is up [place].  Patient was last seen by Sande Rives for preoperative clearance in October 2020 at which time she was doing well.  She does seem to have some degree of orthostatic dizziness at the time.  It was recommended she stay well-hydrated.  More recently, she was seen by Dr. Charisse Klinefelter in [place] for dyspnea on exertion.  I echocardiogram and a 48-hour Holter monitor was recommended. Echocardiogram performed on 05/26/2020 showed EF 66%, stable mitral valve with mean gradient 4 mmHg, mild MR, trace AI, mild to moderate TR, estimated RVSP 37 mmHg.  When compared to the previous echocardiogram, there was no significant change. 48-hour Holter monitor demonstrated predominantly sinus bradycardia to sinus rhythm, maximum heart rate 86 bpm, minimal heart rate 43 bpm, average heart rate 53 bpm, there were 2648 PVCs which accounted for 1.67% of  total heartbeat, there were also 1201 PSVT's with a burden of 0.76%.  There were 2 patient triggered event correlating with ventricular bigeminy.  She was seen by Dr. Charisse Klinefelter on 06/01/2020 who recommended continue observation.  Patient called cardiology service yesterday complaining of intermittent nausea, dizziness, shortness of breath and unsteady gait.  Blood pressure at home was as low as 65/45.  She presents today for further assessment.  Patient presents today for evaluation of recurrent dizziness and hypotension.  Blood pressure in the office today was actually normal.  Symptom occurred on September 10, 26 and again on the 29th.  Blood pressure dropped down to the 40J systolic on the 81XB before returning back to normal.  She denies any recent chest pain however noticed slightly more short of breath than usual.  On exam, her heart rate is regular, and her lung is clear.  I recommended CBC and basic metabolic panel to rule out secondary causes.  I also recommended a longer heart monitor, 30-day event monitor with live monitoring to rule out significant pauses or your regular rhythm to explain her symptoms.  She denies any flushing sensation during the episode, therefore I am not confident this may be vasovagal episode.  I did a full neuro exam on her which was normal, suspicion for stroke is very low.  Her systolic blood pressure has been in the 110s at home, at this point, I recommend she hold hydrochlorothiazide-spironolactone for the next few weeks.  We plan to see her back in 6 weeks for follow-up on  the heart monitor results.  Past Medical History:  Diagnosis Date  . Arthritis   . Benign essential tremor   . CHF (congestive heart failure) ([place])   . Heart murmur   . High cholesterol   . Hypertension   . Insomnia   . Mitral regurgitation   . Mitral valve prolapse   . Osteopenia   . Paroxysmal atrial fibrillation ([place]) 03/11/2013  . Paroxysmal supraventricular tachycardia ([place])   . PONV  (postoperative nausea and vomiting)    Dizziness and Nausea  . S/P Maze operation for atrial fibrillation 03/18/2013   Complete bilateral atrial lesion set using cryothermy and biopolar radiofrequency ablation with oversewing of LA appendage via right mini thoracotomy  . S/P mitral valve repair 03/18/2013   Complex valvuloplasty including triangular resection of posterior leaflet, artificial Gore-tex neocord placement x4 and 88mm Sorin Memo 3D ring annuloplasty via right mini thoracotomy  . Severe mitral regurgitation 03/03/2013   Acute on chronic with acute heart failure   . Shortness of breath   . Wears glasses     Past Surgical History:  Procedure Laterality Date  . ABDOMINAL HYSTERECTOMY     hernia repair  . BIOPSY  08/26/2019   Procedure: BIOPSY;  Surgeon: Wilford Corner, MD;  Location: [facility];  Service: Endoscopy;;  . CARDIAC CATHETERIZATION    . COLONOSCOPY    . COLONOSCOPY WITH PROPOFOL N/A 08/26/2019   Procedure: COLONOSCOPY WITH PROPOFOL;  Surgeon: Wilford Corner, MD;  Location: [facility];  Service: Endoscopy;  Laterality: N/A;  . INTRAOPERATIVE TRANSESOPHAGEAL ECHOCARDIOGRAM N/A 03/18/2013   Procedure: INTRAOPERATIVE TRANSESOPHAGEAL ECHOCARDIOGRAM;  Surgeon: Rexene Alberts, MD;  Location: [facility];  Service: Open Heart Surgery;  Laterality: N/A;  . LEFT AND RIGHT HEART CATHETERIZATION WITH CORONARY ANGIOGRAM N/A 03/06/2013   Procedure: LEFT AND RIGHT HEART CATHETERIZATION WITH CORONARY ANGIOGRAM;  Surgeon: Sinclair Grooms, MD;  Location[facility] C[facility] LAB;  Service: Cardiovascular;  Laterality: N/A;  . MAZE N/A 03/18/2013   Procedure: MAZE;  Surgeon: Rexene Alberts, MD;  Location: [facility];  Service: Open Heart Surgery;  Laterality: N/A;  . MITRAL VALVE REPAIR Right 03/18/2013   Procedure: MINIMALLY INVASIVE MITRAL VALVE REPAIR (MVR);  Surgeon: Rexene Alberts, MD;  Location: [place];  Service: Open Heart Surgery;  Laterality: Right;  Marland Kitchen VARICOSE VEIN SURGERY    . WISDOM TOOTH  EXTRACTION      Current Medications: Current Meds  Medication Sig  . amoxicillin (AMOXIL) 500 MG capsule TAKE 4 CAPSULES BY MOUTH 1 HOUR PRIOR TO DENTAL APPOINTMENT  . aspirin 81 MG tablet Take 1 tablet (81 mg total) by mouth daily.  Marland Kitchen atorvastatin (LIPITOR) 20 MG tablet TAKE 1 TABLET BY MOUTH DAILY  . Calcium Carbonate-Vitamin D (CALCIUM + D PO) Take 1 capsule by mouth daily.   . Melatonin 10 MG TABS Take by mouth.  . metoprolol succinate (TOPROL-XL) 50 MG 24 hr tablet Take 1 tablet (50 mg total) by mouth daily. Take with or immediately following a meal.  . Misc Natural Products (GLUCOSAMINE CHOND COMPLEX/MSM PO) Take 1 tablet by mouth daily.  . Omega-3 Fatty Acids (FISH OIL PO) Take 2 capsules by mouth daily.  . polyethylene glycol (MIRALAX / GLYCOLAX) packet Take 17 g by mouth daily.  Marland Kitchen spironolactone-hydrochlorothiazide (ALDACTAZIDE) 25-25 MG tablet Take 0.5 tablets by mouth daily.  . TURMERIC PO Take 1 tablet by mouth daily.   Marland Kitchen zolpidem (AMBIEN) 5 MG tablet TAKE 1/2 TABLET BY MOUTH AT NIGHT AS NEEDED FOR  SLEEP     Allergies:   Patient has no known allergies.   Social History   Socioeconomic History  . Marital status: Married    Spouse name: Fritz Pickerel  . Number of children: 3  . Years of education: BS  . Highest education level: Not on file  Occupational History    Employer: [employer]  Tobacco Use  . Smoking status: Never Smoker  . Smokeless tobacco: Never Used  Vaping Use  . Vaping Use: Never used  Substance and Sexual Activity  . Alcohol use: Yes    Alcohol/week: 5.0 - 6.0 standard drinks    Types: 5 - 6 Glasses of wine per week    Comment: 1 drink/day  . Drug use: No  . Sexual activity: Yes    Birth control/protection: Surgical  Other Topics Concern  . Not on file  Social History Narrative   Patient lives at home with her husband  Fritz Pickerel)  - second husband. Patient has college education B.S. 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   Right  handed.   Caffeine- one cup daily.   Social Determinants of Health   Financial Resource Strain:   . Difficulty of Paying Living Expenses: Not on file  Food Insecurity:   . Worried About Charity fundraiser in the Last Year: Not on file  . Ran Out of Food in the Last Year: Not on file  Transportation Needs:   . Lack of Transportation (Medical): Not on file  . Lack of Transportation (Non-Medical): Not on file  Physical Activity:   . Days of Exercise per Week: Not on file  . Minutes of Exercise per Session: Not on file  Stress:   . Feeling of Stress : Not on file  Social Connections:   . Frequency of Communication with Friends and Family: Not on file  . Frequency of Social Gatherings with Friends and Family: Not on file  . Attends Religious Services: Not on file  . Active Member of Clubs or Organizations: Not on file  . Attends Archivist Meetings: Not on file  . Marital Status: Not on file     Family History: The patient's family history includes Colon cancer in her paternal grandmother; Down syndrome in her child; Hypertension in her father; Pancreatic cancer in her mother; Seizures in her child; Stomach cancer in her father and paternal grandfather; Thyroid cancer in her sister.  ROS:   Please see the history of present illness.     All other systems reviewed and are negative.  EKGs/Labs/Other Studies Reviewed:    The following studies were reviewed today:  Echo 05/26/2020   EKG:  EKG is not ordered today.    Recent Labs: 09/05/2019: ALT 17; TSH 2.32 08/19/2020: BUN 12; Creatinine, Ser 1.00; Hemoglobin 13.9; Platelets 203; Potassium 4.9; Sodium 140  Recent Lipid Panel    Component Value Date/Time   CHOL 176 09/05/2019 0910   CHOL 176 08/25/2019 1134   TRIG 130.0 09/05/2019 0910   HDL 54.60 09/05/2019 0910   HDL 66 08/25/2019 1134   CHOLHDL 3 09/05/2019 0910   VLDL 26.0 09/05/2019 0910   LDLCALC 95 09/05/2019 0910   LDLCALC 95 08/25/2019 1134     Physical Exam:    VS:  BP 132/74   Pulse 73   Ht 5\' 7"  (1.702 m)   Wt 166 lb (75.3 kg)   BMI 26.00 kg/m     Wt Readings from Last 3 Encounters:  08/19/20 166 lb (75.3 kg)  01/16/20 172 lb 9.6 oz (78.3 kg)  10/14/19 163 lb (73.9 kg)     GEN:  Well nourished, well developed in no acute distress HEENT: Normal NECK: No JVD; No carotid bruits LYMPHATICS: No lymphadenopathy CARDIAC: RRR, no murmurs, rubs, gallops RESPIRATORY:  Clear to auscultation without rales, wheezing or rhonchi  ABDOMEN: Soft, non-tender, non-distended MUSCULOSKELETAL:  No edema; No deformity  SKIN: Warm and dry NEUROLOGIC:  Alert and oriented x 3 PSYCHIATRIC:  Normal affect   ASSESSMENT:    1. Dizziness   2. S/P mitral valve repair   3. PAF (paroxysmal atrial fibrillation) ([place])   4. Essential hypertension   5. Other hyperlipidemia    PLAN:    In order of problems listed above:  1. Dizziness: Recent echocardiogram and Holter monitor were reassuring.  Since July, she has had 3 episodes of severe dizziness.  Symptom does not seems to be orthostatic in nature.  During one the episode, she had a significant drop in the blood pressure.  Although possibility of vasovagal event cannot be ruled out, I recommended a longer heart monitor to rule out significant arrhythmia as a potential cause of her symptoms.  2. History of mitral valve repair: Stable on recent echocardiogram  3. PAF: Not on any anticoagulation therapy given lack of recurrence.  History of Maze procedure  4. Hypertension: Blood pressure 130s in the office, however systolic blood pressure has been in the 110s at home.  Recent sudden drop in the blood pressure with related severe dizziness.  I recommended temporary hold of spironolactone-hydrochlorothiazide for the next few weeks.  5. Hyperlipidemia: On Lipitor   Medication Adjustments/Labs and Tests Ordered: Current medicines are reviewed at length with the patient today.  Concerns  regarding medicines are outlined above.  Orders Placed This Encounter  Procedures  . Basic metabolic panel  . CBC  . CARDIAC EVENT MONITOR   No orders of the defined types were placed in this encounter.   Patient Instructions  Medication Instructions:   HOLD Spironolactone-Hydrochlorothiazide until instructed to start again  *If you need a refill on your cardiac medications before your next appointment, please call your pharmacy*  Lab Work: Your physician recommends that you return for lab work in:  BMET  CBC If you have labs (blood work) drawn today and your tests are completely normal, you will receive your results only by: Marland Kitchen MyChart Message (if you have MyChart) OR . A paper copy in the mail If you have any lab test that is abnormal or we need to change your treatment, we will call you to review the results.  Testing/Procedures: [employer] Cardiac Event Monitor Instructions Your physician has requested you wear your cardiac event monitor for 30 days, (1-30). [employer] may call or text to confirm a shipping address. The monitor will be sent to a land address via UPS. [employer] will not ship a monitor to a PO BOX. It typically takes 3-5 days to receive your monitor after it has been enrolled. [employer] will assist with USPS tracking if your package is delayed. The telephone number for [employer] is [phone]. Once you have received your monitor, please review the enclosed instructions. Instruction tutorials can also be viewed under help and settings on the enclosed cell phone. Your monitor has already been registered assigning a specific monitor serial # to you.  Applying the monitor Remove cell phone from case and turn it on. The cell phone works as Dealer and needs to be  within 10 feet of you at all times. The cell phone will need to be charged on a daily basis. We recommend you plug the cell phone into the enclosed charger at your bedside table every  night.  Monitor batteries: You will receive two monitor batteries labelled #1 and #2. These are your recorders. Plug battery #2 onto the second connection on the enclosed charger. Keep one battery on the charger at all times. This will keep the monitor battery deactivated. It will also keep it fully charged for when you need to switch your monitor batteries. A small light will be blinking on the battery emblem when it is charging. The light on the battery emblem will remain on when the battery is fully charged.  Open package of a Monitor strip. Insert battery #1 into black hood on strip and gently squeeze monitor battery onto connection as indicated in instruction booklet. Set aside while preparing skin.  Choose location for your strip, vertical or horizontal, as indicated in the instruction booklet. Shave to remove all hair from location. There cannot be any lotions, oils, powders, or colognes on skin where monitor is to be applied. Wipe skin clean with enclosed Saline wipe. Dry skin completely.  Peel paper labeled #1 off the back of the Monitor strip exposing the adhesive. Place the monitor on the chest in the vertical or horizontal position shown in the instruction booklet. One arrow on the monitor strip must be pointing upward. Carefully remove paper labeled #2, attaching remainder of strip to your skin. Try not to create any folds or wrinkles in the strip as you apply it.  Firmly press and release the circle in the center of the monitor battery. You will hear a small beep. This is turning the monitor battery on. The heart emblem on the monitor battery will light up every 5 seconds if the monitor battery in turned on and connected to the patient securely. Do not push and hold the circle down as this turns the monitor battery off. The cell phone will locate the monitor battery. A screen will appear on the cell phone checking the connection of your monitor strip. This may read poor  connection initially but change to good connection within the next minute. Once your monitor accepts the connection you will hear a series of 3 beeps followed by a climbing crescendo of beeps. A screen will appear on the cell phone showing the two monitor strip placement options. Touch the picture that demonstrates where you applied the monitor strip.  Your monitor strip and battery are waterproof. You are able to shower, bathe, or swim with the monitor on. They just ask you do not submerge deeper than 3 feet underwater. We recommend removing the monitor if you are swimming in a lake, river, or ocean.  Your monitor battery will need to be switched to a fully charged monitor battery approximately once a week. The cell phone will alert you of an action which needs to be made.  On the cell phone, tap for details to reveal connection status, monitor battery status, and cell phone battery status. The green dots indicates your monitor is in good status. A red dot indicates there is something that needs your attention.  To record a symptom, click the circle on the monitor battery. In 30-60 seconds a list of symptoms will appear on the cell phone. Select your symptom and tap save. Your monitor will record a sustained or significant arrhythmia regardless of you clicking the button. Some patients  do not feel the heart rhythm irregularities. [employer] will notify us of any serious or critical events.  Refer to instruction booklet for instructions on switching batteries, changing strips, the Do not disturb or Pause features, or any additional questions.  Call [employer] at [phone], to confirm your monitor is transmitting and record your baseline. They will answer any questions you may have regarding the monitor instructions at that time.  Returning the monitor to [place] all equipment back into blue box. Peel off strip of paper to expose adhesive and close box securely. There is a  prepaid UPS shipping label on this box. Drop in a UPS drop box, or at a UPS facility like [employer]. You may also contact [employer] to arrange UPS to pick up monitor package at your home.    Follow-Up: At [facility], you and your health needs are our priority.  As part of our continuing mission to provide you with exceptional heart care, we have created designated Provider Care Teams.  These Care Teams include your primary Cardiologist (physician) and Advanced Practice Providers (APPs -  Physician Assistants and Nurse Practitioners) who all work together to provide you with the care you need, when you need it.  Your next appointment:   6 week(s)  The format for your next appointment:   In Person  Provider:   Daneen Schick, MD or Almyra Deforest, PA-C  Other Instructions      Signed, Almyra Deforest, [place]  08/21/2020 8:52 PM    [place]

## 2020-08-19 NOTE — Patient Instructions (Addendum)
Medication Instructions:   HOLD Spironolactone-Hydrochlorothiazide until instructed to start again  *If you need a refill on your cardiac medications before your next appointment, please call your pharmacy*  Lab Work: Your physician recommends that you return for lab work in:  BMET  CBC If you have labs (blood work) drawn today and your tests are completely normal, you will receive your results only by: Marland Kitchen MyChart Message (if you have MyChart) OR . A paper copy in the mail If you have any lab test that is abnormal or we need to change your treatment, we will call you to review the results.  Testing/Procedures: [employer] Cardiac Event Monitor Instructions Your physician has requested you wear your cardiac event monitor for 30 days, (1-30). [employer] may call or text to confirm a shipping address. The monitor will be sent to a land address via UPS. [employer] will not ship a monitor to a PO BOX. It typically takes 3-5 days to receive your monitor after it has been enrolled. [employer] will assist with USPS tracking if your package is delayed. The telephone number for [employer] is [phone]. Once you have received your monitor, please review the enclosed instructions. Instruction tutorials can also be viewed under help and settings on the enclosed cell phone. Your monitor has already been registered assigning a specific monitor serial # to you.  Applying the monitor Remove cell phone from case and turn it on. The cell phone works as Dealer and needs to be within [employer] of you at all times. The cell phone will need to be charged on a daily basis. We recommend you plug the cell phone into the enclosed charger at your bedside table every night.  Monitor batteries: You will receive two monitor batteries labelled #1 and #2. These are your recorders. Plug battery #2 onto the second connection on the enclosed charger. Keep one battery on the charger at all times. This will  keep the monitor battery deactivated. It will also keep it fully charged for when you need to switch your monitor batteries. A small light will be blinking on the battery emblem when it is charging. The light on the battery emblem will remain on when the battery is fully charged.  Open package of a Monitor strip. Insert battery #1 into black hood on strip and gently squeeze monitor battery onto connection as indicated in instruction booklet. Set aside while preparing skin.  Choose location for your strip, vertical or horizontal, as indicated in the instruction booklet. Shave to remove all hair from location. There cannot be any lotions, oils, powders, or colognes on skin where monitor is to be applied. Wipe skin clean with enclosed Saline wipe. Dry skin completely.  Peel paper labeled #1 off the back of the Monitor strip exposing the adhesive. Place the monitor on the chest in the vertical or horizontal position shown in the instruction booklet. One arrow on the monitor strip must be pointing upward. Carefully remove paper labeled #2, attaching remainder of strip to your skin. Try not to create any folds or wrinkles in the strip as you apply it.  Firmly press and release the circle in the center of the monitor battery. You will hear a small beep. This is turning the monitor battery on. The heart emblem on the monitor battery will light up every 5 seconds if the monitor battery in turned on and connected to the patient securely. Do not push and hold the circle down as this turns the monitor battery off. The  cell phone will locate the monitor battery. A screen will appear on the cell phone checking the connection of your monitor strip. This may read poor connection initially but change to good connection within the next minute. Once your monitor accepts the connection you will hear a series of 3 beeps followed by a climbing crescendo of beeps. A screen will appear on the cell phone showing the  two monitor strip placement options. Touch the picture that demonstrates where you applied the monitor strip.  Your monitor strip and battery are waterproof. You are able to shower, bathe, or swim with the monitor on. They just ask you do not submerge deeper than 3 feet underwater. We recommend removing the monitor if you are swimming in a lake, river, or ocean.  Your monitor battery will need to be switched to a fully charged monitor battery approximately once a week. The cell phone will alert you of an action which needs to be made.  On the cell phone, tap for details to reveal connection status, monitor battery status, and cell phone battery status. The green dots indicates your monitor is in good status. A red dot indicates there is something that needs your attention.  To record a symptom, click the circle on the monitor battery. In 30-60 seconds a list of symptoms will appear on the cell phone. Select your symptom and tap save. Your monitor will record a sustained or significant arrhythmia regardless of you clicking the button. Some patients do not feel the heart rhythm irregularities. [employer] will notify us of any serious or critical events.  Refer to instruction booklet for instructions on switching batteries, changing strips, the Do not disturb or Pause features, or any additional questions.  Call [employer] at [phone], to confirm your monitor is transmitting and record your baseline. They will answer any questions you may have regarding the monitor instructions at that time.  Returning the monitor to [place] all equipment back into blue box. Peel off strip of paper to expose adhesive and close box securely. There is a prepaid UPS shipping label on this box. Drop in a UPS drop box, or at a UPS facility like [employer]. You may also contact [employer] to arrange UPS to pick up monitor package at your home.    Follow-Up: At [facility], you and your health  needs are our priority.  As part of our continuing mission to provide you with exceptional heart care, we have created designated Provider Care Teams.  These Care Teams include your primary Cardiologist (physician) and Advanced Practice Providers (APPs -  Physician Assistants and Nurse Practitioners) who all work together to provide you with the care you need, when you need it.  Your next appointment:   6 week(s)  The format for your next appointment:   In Person  Provider:   Daneen Schick, MD or Almyra Deforest, PA-C  Other Instructions

## 2020-08-19 NOTE — Telephone Encounter (Signed)
She should discontinue Aldactazide until further notice. Has she had significant weight loss? When was the Aldactazide started?

## 2020-08-20 NOTE — Telephone Encounter (Signed)
Pt seen by Almyra Deforest, PA-C yesterday and was told to place medication on hold.  Labs were drawn.

## 2020-08-20 NOTE — Progress Notes (Signed)
Normal red blood cell count, normal renal function and electrolyte

## 2020-08-21 ENCOUNTER — Encounter: Payer: Self-pay | Admitting: Physician Assistant

## 2020-08-21 NOTE — Addendum Note (Signed)
Addended byEulas Post, Tonjua Rossetti on: 08/21/2020 09:01 PM   Modules accepted: Level of Service

## 2020-08-24 ENCOUNTER — Ambulatory Visit: Payer: Medicare Other | Attending: Internal Medicine

## 2020-08-24 DIAGNOSIS — Z23 Encounter for immunization: Secondary | ICD-10-CM

## 2020-08-24 NOTE — Progress Notes (Signed)
   Covid-19 Vaccination Clinic  Name:  SHARRIE SELF    MRN: [id number] DOB: [birth year]  08/24/2020  Ms. Alfred was observed post Covid-19 immunization for 15 minutes without incident. She was provided with Vaccine Information Sheet and instruction to access the V-Safe system.   Ms. Venneman was instructed to call 911 with any severe reactions post vaccine: Marland Kitchen Difficulty breathing  . Swelling of face and throat  . A fast heartbeat  . A bad rash all over body  . Dizziness and weakness

## 2020-08-26 ENCOUNTER — Telehealth: Payer: Self-pay | Admitting: Radiology

## 2020-08-26 NOTE — Telephone Encounter (Signed)
Enrolled patient for a 30 day [employer] Event Monitor to be mailed to patients home  

## 2020-08-31 ENCOUNTER — Encounter (INDEPENDENT_AMBULATORY_CARE_PROVIDER_SITE_OTHER): Payer: Medicare Other

## 2020-08-31 DIAGNOSIS — R42 Dizziness and giddiness: Secondary | ICD-10-CM

## 2020-09-02 ENCOUNTER — Ambulatory Visit: Payer: Medicare Other | Attending: Primary Care

## 2020-09-02 DIAGNOSIS — Z23 Encounter for immunization: Secondary | ICD-10-CM

## 2020-09-02 NOTE — Progress Notes (Signed)
1/2 dose flu shot given .

## 2020-09-05 NOTE — Patient Instructions (Addendum)
Blood work was ordered.    All other Health Maintenance issues reviewed.   All recommended immunizations and age-appropriate screenings are up-to-date or discussed.  Flu immunization administered today.    Medications reviewed and updated.  Changes include :   none    Please followup in 1 year    Health Maintenance, Female Adopting a healthy lifestyle and getting preventive care are important in promoting health and wellness. Ask your health care provider about:  The right schedule for you to have regular tests and exams.  Things you can do on your own to prevent diseases and keep yourself healthy. What should I know about diet, weight, and exercise? Eat a healthy diet   Eat a diet that includes plenty of vegetables, fruits, low-fat dairy products, and lean protein.  Do not eat a lot of foods that are high in solid fats, added sugars, or sodium. Maintain a healthy weight Body mass index (BMI) is used to identify weight problems. It estimates body fat based on height and weight. Your health care provider can help determine your BMI and help you achieve or maintain a healthy weight. Get regular exercise Get regular exercise. This is one of the most important things you can do for your health. Most adults should:  Exercise for at least 150 minutes each week. The exercise should increase your heart rate and make you sweat (moderate-intensity exercise).  Do strengthening exercises at least twice a week. This is in addition to the moderate-intensity exercise.  Spend less time sitting. Even light physical activity can be beneficial. Watch cholesterol and blood lipids Have your blood tested for lipids and cholesterol at 66 years of age, then have this test every 5 years. Have your cholesterol levels checked more often if:  Your lipid or cholesterol levels are high.  You are older than 66 years of age.  You are at high risk for heart disease. What should I know about cancer  screening? Depending on your health history and family history, you may need to have cancer screening at various ages. This may include screening for:  Breast cancer.  Cervical cancer.  Colorectal cancer.  Skin cancer.  Lung cancer. What should I know about heart disease, diabetes, and high blood pressure? Blood pressure and heart disease  High blood pressure causes heart disease and increases the risk of stroke. This is more likely to develop in people who have high blood pressure readings, are of African descent, or are overweight.  Have your blood pressure checked: ? Every 3-5 years if you are 25-36 years of age. ? Every year if you are 65 years old or older. Diabetes Have regular diabetes screenings. This checks your fasting blood sugar level. Have the screening done:  Once every three years after age 66 if you are at a normal weight and have a low risk for diabetes.  More often and at a younger age if you are overweight or have a high risk for diabetes. What should I know about preventing infection? Hepatitis B If you have a higher risk for hepatitis B, you should be screened for this virus. Talk with your health care provider to find out if you are at risk for hepatitis B infection. Hepatitis C Testing is recommended for:  Everyone born from [birth year] through 1967.  Anyone with known risk factors for hepatitis C. Sexually transmitted infections (STIs)  Get screened for STIs, including gonorrhea and chlamydia, if: ? You are sexually active and are younger than 66 years  of age. ? You are older than 66 years of age and your health care provider tells you that you are at risk for this type of infection. ? Your sexual activity has changed since you were last screened, and you are at increased risk for chlamydia or gonorrhea. Ask your health care provider if you are at risk.  Ask your health care provider about whether you are at high risk for HIV. Your health care provider may  recommend a prescription medicine to help prevent HIV infection. If you choose to take medicine to prevent HIV, you should first get tested for HIV. You should then be tested every 3 months for as long as you are taking the medicine. Pregnancy  If you are about to stop having your period (premenopausal) and you may become pregnant, seek counseling before you get pregnant.  Take 400 to 800 micrograms (mcg) of folic acid every day if you become pregnant.  Ask for birth control (contraception) if you want to prevent pregnancy. Osteoporosis and menopause Osteoporosis is a disease in which the bones lose minerals and strength with aging. This can result in bone fractures. If you are 65 years old or older, or if you are at risk for osteoporosis and fractures, ask your health care provider if you should:  Be screened for bone loss.  Take a calcium or vitamin D supplement to lower your risk of fractures.  Be given hormone replacement therapy (HRT) to treat symptoms of menopause. Follow these instructions at home: Lifestyle  Do not use any products that contain nicotine or tobacco, such as cigarettes, e-cigarettes, and chewing tobacco. If you need help quitting, ask your health care provider.  Do not use street drugs.  Do not share needles.  Ask your health care provider for help if you need support or information about quitting drugs. Alcohol use  Do not drink alcohol if: ? Your health care provider tells you not to drink. ? You are pregnant, may be pregnant, or are planning to become pregnant.  If you drink alcohol: ? Limit how much you use to 0-1 drink a day. ? Limit intake if you are breastfeeding.  Be aware of how much alcohol is in your drink. In the [place] one drink equals one 12 oz bottle of beer (355 mL), one 5 oz glass of wine (148 mL), or one 1 oz glass of hard liquor (44 mL). General instructions  Schedule regular health, dental, and eye exams.  Stay current with your  vaccines.  Tell your health care provider if: ? You often feel depressed. ? You have ever been abused or do not feel safe at home. Summary  Adopting a healthy lifestyle and getting preventive care are important in promoting health and wellness.  Follow your health care provider's instructions about healthy diet, exercising, and getting tested or screened for diseases.  Follow your health care provider's instructions on monitoring your cholesterol and blood pressure. This information is not intended to replace advice given to you by your health care provider. Make sure you discuss any questions you have with your health care provider. Document Revised: 10/30/2018 Document Reviewed: 10/30/2018 Elsevier Patient Education  2020 [employer]  

## 2020-09-05 NOTE — Progress Notes (Signed)
Subjective:    Patient ID: Ariel Simpson, female    DOB: 03/20/1956, 64 y.o.   MRN: [id number]   This visit occurred during the SARS-CoV-2 public health emergency.  Safety protocols were in place, including screening questions prior to the visit, additional usage of staff PPE, and extensive cleaning of exam room while observing appropriate contact time as indicated for disinfecting solutions.  HPI She is here for a physical exam.   Has had 3 episodes of severe hypotension.  She saw cardiology and her BP med was held and she has a holter monitor on.  She has a history of paroxysmal atrial fibrillation.  Her BP has been higher.  Since then two days she did not feel well-1 time her blood pressure was on the low side and that was 3 days after she stopped her blood pressure medication.  The other day it felt like her legs were not working properly - she felt like she was not able to lift her legs properly.  Since then she has not had those symptoms.  She is a little anxious about the symptoms she had.  She was concerned that maybe something neurological was going on.  She drinks water throughout the day.     Medications and allergies reviewed with patient and updated if appropriate.  Patient Active Problem List   Diagnosis Date Noted   Personal history of colonic polyps 08/26/2019   Osteopenia 09/23/2016   Need for prophylactic antibiotic 09/23/2016   Tremor, benign 08/08/2016   Hyperlipidemia 08/08/2016   Insomnia 08/08/2016   S/P mitral valve repair 03/18/2013   S/P Maze operation for atrial fibrillation 03/18/2013   Paroxysmal atrial fibrillation ([place]) 03/11/2013   History of PSVT (paroxysmal supraventricular tachycardia) 03/03/2013    Current Outpatient Medications on File Prior to Visit  Medication Sig Dispense Refill   amoxicillin (AMOXIL) 500 MG capsule TAKE 4 CAPSULES BY MOUTH 1 HOUR PRIOR TO DENTAL APPOINTMENT 4 capsule 3   aspirin 81 MG tablet Take 1 tablet  (81 mg total) by mouth daily.     atorvastatin (LIPITOR) 20 MG tablet TAKE 1 TABLET BY MOUTH DAILY 90 tablet 1   Calcium Carbonate-Vitamin D (CALCIUM + D PO) Take 1 capsule by mouth daily.      Melatonin 10 MG TABS Take by mouth.     metoprolol succinate (TOPROL-XL) 50 MG 24 hr tablet Take 1 tablet (50 mg total) by mouth daily. Take with or immediately following a meal. 90 tablet 3   Misc Natural Products (GLUCOSAMINE CHOND COMPLEX/MSM PO) Take 1 tablet by mouth daily.     Omega-3 Fatty Acids (FISH OIL PO) Take 2 capsules by mouth daily.     polyethylene glycol (MIRALAX / GLYCOLAX) packet Take 17 g by mouth daily.     TURMERIC PO Take 1 tablet by mouth daily.      zolpidem (AMBIEN) 5 MG tablet TAKE 1/2 TABLET BY MOUTH AT NIGHT AS NEEDED FOR SLEEP 30 tablet 0   spironolactone-hydrochlorothiazide (ALDACTAZIDE) 25-25 MG tablet Take 0.5 tablets by mouth daily. (Patient not taking: Reported on 09/06/2020) 45 tablet 3   No current facility-administered medications on file prior to visit.    Past Medical History:  Diagnosis Date   Arthritis    Benign essential tremor    CHF (congestive heart failure) (HCC)    Heart murmur    High cholesterol    Hypertension    Insomnia    Mitral regurgitation    Mitral valve  prolapse    Osteopenia    Paroxysmal atrial fibrillation (HCC) 03/11/2013   Paroxysmal supraventricular tachycardia (HCC)    PONV (postoperative nausea and vomiting)    Dizziness and Nausea   S/P Maze operation for atrial fibrillation 03/18/2013   Complete bilateral atrial lesion set using cryothermy and biopolar radiofrequency ablation with oversewing of LA appendage via right mini thoracotomy   S/P mitral valve repair 03/18/2013   Complex valvuloplasty including triangular resection of posterior leaflet, artificial Gore-tex neocord placement x4 and 25mm Sorin Memo 3D ring annuloplasty via right mini thoracotomy   Severe mitral regurgitation 03/03/2013   Acute  on chronic with acute heart failure    Shortness of breath    Wears glasses     Past Surgical History:  Procedure Laterality Date   ABDOMINAL HYSTERECTOMY     hernia repair   BIOPSY  08/26/2019   Procedure: BIOPSY;  Surgeon: Wilford Corner, MD;  Location: [facility];  Service: Endoscopy;;   CARDIAC CATHETERIZATION     COLONOSCOPY     COLONOSCOPY WITH PROPOFOL N/A 08/26/2019   Procedure: COLONOSCOPY WITH PROPOFOL;  Surgeon: Wilford Corner, MD;  Location: [facility];  Service: Endoscopy;  Laterality: N/A;   INTRAOPERATIVE TRANSESOPHAGEAL ECHOCARDIOGRAM N/A 03/18/2013   Procedure: INTRAOPERATIVE TRANSESOPHAGEAL ECHOCARDIOGRAM;  Surgeon: Rexene Alberts, MD;  Location: [place];  Service: Open Heart Surgery;  Laterality: N/A;   LEFT AND RIGHT HEART CATHETERIZATION WITH CORONARY ANGIOGRAM N/A 03/06/2013   Procedure: LEFT AND RIGHT HEART CATHETERIZATION WITH CORONARY ANGIOGRAM;  Surgeon: Sinclair Grooms, MD;  Location: [facility] CATH LAB;  Service: Cardiovascular;  Laterality: N/A;   MAZE N/A 03/18/2013   Procedure: MAZE;  Surgeon: Rexene Alberts, MD;  Location: [place];  Service: Open Heart Surgery;  Laterality: N/A;   MITRAL VALVE REPAIR Right 03/18/2013   Procedure: MINIMALLY INVASIVE MITRAL VALVE REPAIR (MVR);  Surgeon: Rexene Alberts, MD;  Location: [place];  Service: Open Heart Surgery;  Laterality: Right;   VARICOSE VEIN SURGERY     WISDOM TOOTH EXTRACTION      Social History   Socioeconomic History   Marital status: Married    Spouse name: Fritz Pickerel   Number of children: 3   Years of education: BS   Highest education level: Not on file  Occupational History    Employer: [employer]  Tobacco Use   Smoking status: Never Smoker   Smokeless tobacco: Never Used  Vaping Use   Vaping Use: Never used  Substance and Sexual Activity   Alcohol use: Yes    Alcohol/week: 5.0 - 6.0 standard drinks    Types: 5 - 6 Glasses of wine per week    Comment: 1  drink/day   Drug use: No   Sexual activity: Yes    Birth control/protection: Surgical  Other Topics Concern   Not on file  Social History Narrative   Patient lives at home with her husband  Fritz Pickerel)  - second husband. Patient has college education B.S. 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   Caffeine- one cup daily.   Social Determinants of Health   Financial Resource Strain:    Difficulty of Paying Living Expenses: Not on file  Food Insecurity:    Worried About Charity fundraiser in the Last Year: Not on file   [employer] of Food in the Last Year: Not on file  Transportation Needs:    Lack of Transportation (Medical): Not on file  Lack of Transportation (Non-Medical): Not on file  Physical Activity:    Days of Exercise per Week: Not on file   Minutes of Exercise per Session: Not on file  Stress:    Feeling of Stress : Not on file  Social Connections:    Frequency of Communication with Friends and Family: Not on file   Frequency of Social Gatherings with Friends and Family: Not on file   Attends Religious Services: Not on file   Active Member of Clubs or Organizations: Not on file   Attends Archivist Meetings: Not on file   Marital Status: Not on file    Family History  Problem Relation Age of Onset   Pancreatic cancer Mother    Stomach cancer Father    Hypertension Father    Thyroid cancer Sister    Colon cancer Paternal Grandmother    Stomach cancer Paternal Grandfather    Seizures Child    Down syndrome Child     Review of Systems  Constitutional: Negative for chills and fever.  Eyes: Negative for visual disturbance.  Respiratory: Negative for cough, shortness of breath and wheezing.   Cardiovascular: Positive for palpitations. Negative for chest pain and leg swelling.  Gastrointestinal: Positive for constipation. Negative for abdominal pain, blood in stool, diarrhea and nausea.       No gerd    Endocrine: Positive for cold intolerance.  Genitourinary: Negative for dysuria and hematuria.  Musculoskeletal: Positive for arthralgias (diffuse arthritis).  Skin: Negative for color change and rash.  Neurological: Positive for light-headedness and headaches (rare headaches). Negative for weakness and numbness.  Psychiatric/Behavioral: Negative for dysphoric mood. The patient is not nervous/anxious.        Objective:   Vitals:   09/06/20 0835  BP: 130/82  Pulse: (!) 56  Temp: 98.2 F (36.8 C)  SpO2: 97%   Filed Weights   09/06/20 0835  Weight: 166 lb (75.3 kg)   Body mass index is 26 kg/m.  BP Readings from Last 3 Encounters:  09/06/20 130/82  08/19/20 132/74  01/16/20 102/68    Wt Readings from Last 3 Encounters:  09/06/20 166 lb (75.3 kg)  08/19/20 166 lb (75.3 kg)  01/16/20 172 lb 9.6 oz (78.3 kg)     Physical Exam Constitutional: She appears well-developed and well-nourished. No distress.  HENT:  Head: Normocephalic and atraumatic.  Right Ear: External ear normal. Normal ear canal and TM Left Ear: External ear normal.  Normal ear canal and TM Mouth/Throat: Oropharynx is clear and moist.  Eyes: Conjunctivae and EOM are normal.  Neck: Neck supple. No tracheal deviation present. No thyromegaly present.  No carotid bruit  Cardiovascular: Normal rate, regular rhythm and normal heart sounds.   No murmur heard.  No edema. Pulmonary/Chest: Effort normal and breath sounds normal. No respiratory distress. She has no wheezes. She has no rales.  Breast: deferred   Abdominal: Soft. She exhibits no distension. There is no tenderness.  Lymphadenopathy: She has no cervical adenopathy.  Skin: Skin is warm and dry. She is not diaphoretic.  Psychiatric: She has a normal mood and affect. Her behavior is normal.        Assessment & Plan:   Physical exam: Screening blood work    ordered Immunizations  Flu vac today, prevnar deferred-she may schedule this for a  different date, discussed shingrix Colonoscopy    Up to date  Mammogram  Up to date  Gyn  Up to date  Dexa  Up to  date  Eye exams  Up to date  Exercise  Regular - yoga, some walking Weight  Working on weight loss Substance abuse  none       See Problem List for Assessment and Plan of chronic medical problems.

## 2020-09-06 ENCOUNTER — Other Ambulatory Visit: Payer: Self-pay

## 2020-09-06 ENCOUNTER — Encounter: Payer: Self-pay | Admitting: Internal Medicine

## 2020-09-06 ENCOUNTER — Ambulatory Visit (INDEPENDENT_AMBULATORY_CARE_PROVIDER_SITE_OTHER): Payer: Medicare Other | Admitting: Internal Medicine

## 2020-09-06 VITALS — BP 130/82 | HR 56 | Temp 98.2°F | Ht 67.0 in | Wt 166.0 lb

## 2020-09-06 DIAGNOSIS — G4700 Insomnia, unspecified: Secondary | ICD-10-CM

## 2020-09-06 DIAGNOSIS — I480 Paroxysmal atrial fibrillation: Secondary | ICD-10-CM | POA: Diagnosis not present

## 2020-09-06 DIAGNOSIS — E7849 Other hyperlipidemia: Secondary | ICD-10-CM

## 2020-09-06 DIAGNOSIS — I952 Hypotension due to drugs: Secondary | ICD-10-CM

## 2020-09-06 DIAGNOSIS — Z0000 Encounter for general adult medical examination without abnormal findings: Secondary | ICD-10-CM

## 2020-09-06 DIAGNOSIS — I959 Hypotension, unspecified: Secondary | ICD-10-CM | POA: Insufficient documentation

## 2020-09-06 DIAGNOSIS — M85851 Other specified disorders of bone density and structure, right thigh: Secondary | ICD-10-CM | POA: Diagnosis not present

## 2020-09-06 DIAGNOSIS — Z23 Encounter for immunization: Secondary | ICD-10-CM

## 2020-09-06 LAB — COMPREHENSIVE METABOLIC PANEL
ALT: 16 U/L (ref 0–35)
AST: 18 U/L (ref 0–37)
Albumin: 4.4 g/dL (ref 3.5–5.2)
Alkaline Phosphatase: 60 U/L (ref 39–117)
BUN: 10 mg/dL (ref 6–23)
CO2: 28 mEq/L (ref 19–32)
Calcium: 9.6 mg/dL (ref 8.4–10.5)
Chloride: 106 mEq/L (ref 96–112)
Creatinine, Ser: 0.83 mg/dL (ref 0.40–1.20)
GFR: 73.09 mL/min (ref >60.00)
Glucose, Bld: 90 mg/dL (ref 70–99)
Potassium: 4.9 mEq/L (ref 3.5–5.1)
Sodium: 141 mEq/L (ref 135–145)
Total Bilirubin: 0.6 mg/dL (ref 0.2–1.2)
Total Protein: 6.7 g/dL (ref 6.0–8.3)

## 2020-09-06 LAB — CBC WITH DIFFERENTIAL/PLATELET
Basophils Absolute: 0 10*3/uL (ref 0.0–0.1)
Basophils Relative: 1 % (ref 0.0–3.0)
Eosinophils Absolute: 0.1 10*3/uL (ref 0.0–0.7)
Eosinophils Relative: 1.7 % (ref 0.0–5.0)
HCT: 38.6 % (ref 36.0–46.0)
Hemoglobin: 13.1 g/dL (ref 12.0–15.0)
Lymphocytes Relative: 35.9 % (ref 12.0–46.0)
Lymphs Abs: 1.6 10*3/uL (ref 0.7–4.0)
MCHC: 34.1 g/dL (ref 30.0–36.0)
MCV: 90.9 fl (ref 78.0–100.0)
Monocytes Absolute: 0.4 10*3/uL (ref 0.1–1.0)
Monocytes Relative: 8.2 % (ref 3.0–12.0)
Neutro Abs: 2.3 10*3/uL (ref 1.4–7.7)
Neutrophils Relative %: 53.2 % (ref 43.0–77.0)
Platelets: 236 10*3/uL (ref 150.0–400.0)
RBC: 4.24 Mil/uL (ref 3.87–5.11)
RDW: 12.6 % (ref 11.5–15.5)
WBC: 4.3 10*3/uL (ref 4.0–10.5)

## 2020-09-06 LAB — LIPID PANEL
Cholesterol: 144 mg/dL (ref 0–200)
HDL: 51.5 mg/dL (ref >39.00)
LDL Cholesterol: 73 mg/dL (ref 0–99)
NonHDL: 92.29
Total CHOL/HDL Ratio: 3
Triglycerides: 95 mg/dL (ref 0.0–149.0)
VLDL: 19 mg/dL (ref 0.0–40.0)

## 2020-09-06 LAB — VITAMIN D 25 HYDROXY (VIT D DEFICIENCY, FRACTURES): VITD: 33.29 ng/mL (ref 30.00–100.00)

## 2020-09-06 LAB — TSH: TSH: 2.06 u[IU]/mL (ref 0.35–4.50)

## 2020-09-06 NOTE — Addendum Note (Signed)
Addended by: Marcina Millard on: 09/06/2020 01:41 PM   Modules accepted: Orders

## 2020-09-06 NOTE — Assessment & Plan Note (Signed)
Chronic Check lipid panel, CMP Continue atorvastatin 20 mg daily Regular exercise and healthy diet encouraged

## 2020-09-06 NOTE — Assessment & Plan Note (Addendum)
Status post Maze procedure Following with cardiology On metoprolol Occasionally feels palpitations, which has not changed Currently wearing Holter monitor and has follow-up with cardiology Check CBC, TSH, CMP

## 2020-09-06 NOTE — Assessment & Plan Note (Signed)
Chronic dexa up to date Taking calcium and vitamin d  Yoga and walking some

## 2020-09-06 NOTE — Assessment & Plan Note (Signed)
Chronic Takes melatonin 10 mg nightly and she will continue this Takes Ambien 5 mg as needed.  She would prefer not to take this, but she cannot sleep some nights without it

## 2020-09-06 NOTE — Assessment & Plan Note (Signed)
Subacute Was having severe hypotension and did see cardiology-Spironolactone-hydrochlorothiazide held and her blood pressure has improved and she has not had those episodes Has further follow-up with cardiology and is currently wearing a Holter monitor to rule out other potential causes

## 2020-09-11 ENCOUNTER — Telehealth: Payer: Self-pay | Admitting: Primary Care

## 2020-09-11 MED ORDER — CYCLOBENZAPRINE HCL 5 MG PO TABS *I*
5.0000 mg | ORAL_TABLET | Freq: Three times a day (TID) | ORAL | 0 refills | Status: DC | PRN
Start: 2020-09-11 — End: 2021-03-18

## 2020-09-11 NOTE — Telephone Encounter (Signed)
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

## 2020-09-12 NOTE — Progress Notes (Signed)
Cardiology Office Note:    Date:  09/15/2020   ID:  GULIANNA Simpson, DOB 11-Jan-1956, MRN [id number]  PCP:  Binnie Rail, MD  Cardiologist:  Sinclair Grooms, MD   Referring MD: Binnie Rail, MD   Chief Complaint  Patient presents with  . Advice Only    Neurally   . Cardiac Valve Problem    Mitral valve regurgitation    History of Present Illness:    Ariel GUINTHER is a 64 y.o. female with a hx of Mitral valve prolapse, significant mitral regurgitation resulting in mitral valve repair in 2013, paroxysmal atrial fibrillation preceding surgery,operative Maze,and remote history of PSVT antedating mitral valve regurgitation by 20 years.   Starting September 10 and ending August 18, 2020, Adlyn had 3 episodes of nausea, diaphoresis, lightheadedness, dizziness, and near syncope.  These are early morning.  1 after eating breakfast.  Others not associated with eating.  Prior history is that she was diagnosed with "orthostatic hypotension" in her early 44s.  She has never had frank syncope.  During these episodes she has not had palpitations.  She saw Ariel Deforest PA-C at [facility] in early October.  She is now wearing a continuous monitor.  Her diuretic therapy was discontinued.  She has had no recurrent episodes of near syncope.  Past Medical History:  Diagnosis Date  . Arthritis   . Benign essential tremor   . CHF (congestive heart failure) ([place])   . Heart murmur   . High cholesterol   . Hypertension   . Insomnia   . Mitral regurgitation   . Mitral valve prolapse   . Osteopenia   . Paroxysmal atrial fibrillation ([place]) 03/11/2013  . Paroxysmal supraventricular tachycardia ([place])   . PONV (postoperative nausea and vomiting)    Dizziness and Nausea  . S/P Maze operation for atrial fibrillation 03/18/2013   Complete bilateral atrial lesion set using cryothermy and biopolar radiofrequency ablation with oversewing of LA appendage via right mini thoracotomy  . S/P mitral valve  repair 03/18/2013   Complex valvuloplasty including triangular resection of posterior leaflet, artificial Gore-tex neocord placement x4 and 6mm Sorin Memo 3D ring annuloplasty via right mini thoracotomy  . Severe mitral regurgitation 03/03/2013   Acute on chronic with acute heart failure   . Shortness of breath   . Wears glasses     Past Surgical History:  Procedure Laterality Date  . ABDOMINAL HYSTERECTOMY     hernia repair  . BIOPSY  08/26/2019   Procedure: BIOPSY;  Surgeon: Wilford Corner, MD;  Location: [facility];  Service: Endoscopy;;  . CARDIAC CATHETERIZATION    . COLONOSCOPY    . COLONOSCOPY WITH PROPOFOL N/A 08/26/2019   Procedure: COLONOSCOPY WITH PROPOFOL;  Surgeon: Wilford Corner, MD;  [facility];  Service: Endoscopy;  Laterality: N/A;  . INTRAOPERATIVE TRANSESOPHAGEAL ECHOCARDIOGRAM N/A 03/18/2013   Procedure: INTRAOPERATIVE TRANSESOPHAGEAL ECHOCARDIOGRAM;  Surgeon: Rexene Alberts, MD;  Location: [place];  Service: Open Heart Surgery;  Laterality: N/A;  . LEFT AND RIGHT HEART CATHETERIZATION WITH CORONARY ANGIOGRAM N/A 03/06/2013   Procedure: LEFT AND RIGHT HEART CATHETERIZATION WITH CORONARY ANGIOGRAM;  Surgeon: Sinclair Grooms, MD;  Location: [facility] CATH LAB;  Service: Cardiovascular;  Laterality: N/A;  . MAZE N/A 03/18/2013   Procedure: MAZE;  Surgeon: Rexene Alberts, MD;  Location: [place];  Service: Open Heart Surgery;  Laterality: N/A;  . MITRAL VALVE REPAIR Right 03/18/2013   Procedure: MINIMALLY INVASIVE MITRAL VALVE REPAIR (MVR);  Surgeon: Rexene Alberts, MD;  Location: [place];  Service: Open Heart Surgery;  Laterality: Right;  Marland Kitchen VARICOSE VEIN SURGERY    . WISDOM TOOTH EXTRACTION      Current Medications: Current Meds  Medication Sig  . amoxicillin (AMOXIL) 500 MG capsule TAKE 4 CAPSULES BY MOUTH 1 HOUR PRIOR TO DENTAL APPOINTMENT  . aspirin 81 MG tablet Take 1 tablet (81 mg total) by mouth daily.  Marland Kitchen atorvastatin (LIPITOR) 20 MG tablet TAKE 1 TABLET  BY MOUTH DAILY  . Calcium Carbonate-Vitamin D (CALCIUM + D PO) Take 1 capsule by mouth daily.   . Melatonin 10 MG TABS Take by mouth.  . metoprolol succinate (TOPROL-XL) 50 MG 24 hr tablet Take 1 tablet (50 mg total) by mouth daily. Take with or immediately following a meal.  . Misc Natural Products (GLUCOSAMINE CHOND COMPLEX/MSM PO) Take 1 tablet by mouth daily.  . Omega-3 Fatty Acids (FISH OIL PO) Take 2 capsules by mouth daily.  . polyethylene glycol (MIRALAX / GLYCOLAX) packet Take 17 g by mouth daily.  . TURMERIC PO Take 1 tablet by mouth daily.   Marland Kitchen zolpidem (AMBIEN) 5 MG tablet TAKE 1/2 TABLET BY MOUTH AT NIGHT AS NEEDED FOR SLEEP     Allergies:   Patient has no known allergies.   Social History   Socioeconomic History  . Marital status: Married    Spouse name: Ariel Simpson  . Number of children: 3  . Years of education: BS  . Highest education level: Not on file  Occupational History    Employer: [employer]  Tobacco Use  . Smoking status: Never Smoker  . Smokeless tobacco: Never Used  Vaping Use  . Vaping Use: Never used  Substance and Sexual Activity  . Alcohol use: Yes    Alcohol/week: 5.0 - 6.0 standard drinks    Types: 5 - 6 Glasses of wine per week    Comment: 1 drink/day  . Drug use: No  . Sexual activity: Yes    Birth control/protection: Surgical  Other Topics Concern  . Not on file  Social History Narrative   Patient lives at home with her husband  Ariel Simpson)  - second husband. Patient has college education B.S. 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   Caffeine- one cup daily.   Social Determinants of Health   Financial Resource Strain: Low Risk   . Difficulty of Paying Living Expenses: Not hard at all  Food Insecurity: No Food Insecurity  . Worried About Charity fundraiser in the Last Year: Never true  . Ran Out of Food in the Last Year: Never true  Transportation Needs: No Transportation Needs  . Lack of Transportation  (Medical): No  . Lack of Transportation (Non-Medical): No  Physical Activity: Sufficiently Active  . Days of Exercise per Week: 7 days  . Minutes of Exercise per Session: 60 min  Stress: Stress Concern Present  . Feeling of Stress : Very much  Social Connections: Unknown  . Frequency of Communication with Friends and Family: More than three times a week  . Frequency of Social Gatherings with Friends and Family: Once a week  . Attends Religious Services: Patient refused  . Active Member of Clubs or Organizations: Patient refused  . Attends Archivist Meetings: Patient refused  . Marital Status: Married     Family History: The patient's family history includes Colon cancer in her paternal grandmother; Down syndrome in her  child; Hypertension in her father; Pancreatic cancer in her mother; Seizures in her child; Stomach cancer in her father and paternal grandfather; Thyroid cancer in her sister.  ROS:   Please see the history of present illness.    Anxiety episodes.  All other systems reviewed and are negative.  EKGs/Labs/Other Studies Reviewed:    The following studies were reviewed today:  ECHOCARDIOGRAM 2021 [facility])  3 D EF 64%. Intact MV repair. Mild TR with RVSP 39 mmHg.   EKG:  EKG sinus rhythm, normal PR interval, normal EKG.  Recent Labs: 09/06/2020: ALT 16; BUN 10; Creatinine, Ser 0.83; Hemoglobin 13.1; Platelets 236.0; Potassium 4.9; Sodium 141; TSH 2.06  Recent Lipid Panel    Component Value Date/Time   CHOL 144 09/06/2020 0933   CHOL 176 08/25/2019 1134   TRIG 95.0 09/06/2020 0933   HDL 51.50 09/06/2020 0933   HDL 66 08/25/2019 1134   CHOLHDL 3 09/06/2020 0933   VLDL 19.0 09/06/2020 0933   LDLCALC 73 09/06/2020 0933   LDLCALC 95 08/25/2019 1134    Physical Exam:    VS:  BP (!) 146/90   Pulse 60   Ht 5\' 7"  (1.702 m)   Wt 164 lb 9.6 oz (74.7 kg)   SpO2 99%   BMI 25.78 kg/m     Wt Readings from Last 3 Encounters:  09/15/20 164  lb 9.6 oz (74.7 kg)  09/06/20 166 lb (75.3 kg)  08/19/20 166 lb (75.3 kg)     GEN: Compatible with age. No acute distress HEENT: Normal NECK: No JVD. LYMPHATICS: No lymphadenopathy CARDIAC: 1-2 over 6 left mid sternal and apical systolic murmur of mitral regurgitation. RRR without diastolic murmur, gallop, or edema. VASCULAR: Normal Pulses. No bruits. RESPIRATORY:  Clear to auscultation without rales, wheezing or rhonchi  ABDOMEN: Soft, non-tender, non-distended, No pulsatile mass, MUSCULOSKELETAL: No deformity  SKIN: Warm and dry NEUROLOGIC:  Alert and oriented x 3 PSYCHIATRIC:  Normal affect   ASSESSMENT:    1. Vasovagal near syncope   2. S/P mitral valve repair   3. PAF (paroxysmal atrial fibrillation) ([place])   4. Essential hypertension   5. Other hyperlipidemia   6. S/P Maze operation for atrial fibrillation   7. Educated about COVID-19 virus infection    PLAN:    In order of problems listed above:  1. Neurally mediated near syncope.  Diuretic therapy has been discontinued.  Patient was taught about avoidance of syncope by honoring the prodrome.  She knows to lie down, get her legs up, and also use cold compresses to help avert episodes. 2. See echo report above in [place].  Stable with grade 2/6 systolic murmur. 3. Wearing a continuous monitor to rule out sinus node dysfunction/tachybradycardia syndrome/A. fib burden in the setting of recurrent near syncopal episodes. 4. Off diuretic therapy, blood pressure is no longer dipped down into the low 353 systolic millimeter mercury range.  She has had no recurrent episodes of near syncope. 5. Continue statin therapy.  Liver and lipid panel will be obtained today. 6. Not discussed 7. Vaccinated and has not suffered COVID-19 disease.  Prolonged office visit concerning explanation of neurally mediated near syncope.   Medication Adjustments/Labs and Tests Ordered: Current medicines are reviewed at length with the patient  today.  Concerns regarding medicines are outlined above.  Orders Placed This Encounter  Procedures  . EKG 12-Lead   No orders of the defined types were placed in this encounter.   Patient Instructions  Medication Instructions:  Your physician recommends that you continue on your current medications as directed. Please refer to the Current Medication list given to you today.  *If you need a refill on your cardiac medications before your next appointment, please call your pharmacy*   Lab Work: None If you have labs (blood work) drawn today and your tests are completely normal, you will receive your results only by: Marland Kitchen MyChart Message (if you have MyChart) OR . A paper copy in the mail If you have any lab test that is abnormal or we need to change your treatment, we will call you to review the results.   Testing/Procedures: None   Follow-Up: At [facility], you and your health needs are our priority.  As part of our continuing mission to provide you with exceptional heart care, we have created designated Provider Care Teams.  These Care Teams include your primary Cardiologist (physician) and Advanced Practice Providers (APPs -  Physician Assistants and Nurse Practitioners) who all work together to provide you with the care you need, when you need it.  We recommend signing up for the patient portal called "MyChart".  Sign up information is provided on this After Visit Summary.  MyChart is used to connect with patients for Virtual Visits (Telemedicine).  Patients are able to view lab/test results, encounter notes, upcoming appointments, etc.  Non-urgent messages can be sent to your provider as well.   To learn more about what you can do with MyChart, go to [email or web address].    Your next appointment:   12 month(s)  The format for your next appointment:   In Person  Provider:   You may see Sinclair Grooms, MD or one of the following Advanced Practice Providers on your  designated Care Team:    Truitt Merle, NP  Cecilie Kicks, NP  Kathyrn Drown, NP    Other Instructions      Signed, Sinclair Grooms, MD  09/15/2020 9:24 AM    [place]

## 2020-09-14 ENCOUNTER — Ambulatory Visit (INDEPENDENT_AMBULATORY_CARE_PROVIDER_SITE_OTHER): Payer: Medicare Other

## 2020-09-14 DIAGNOSIS — Z0000 Encounter for general adult medical examination without abnormal findings: Secondary | ICD-10-CM | POA: Diagnosis not present

## 2020-09-14 NOTE — Progress Notes (Signed)
I connected with Ariel Simpson today by telephone and verified that I am speaking with the correct person using two identifiers. Location patient: home Location provider: work Persons participating in the virtual visit: Ariel Simpson and Ariel Abu, LPN.   I discussed the limitations, risks, security and privacy concerns of performing an evaluation and management service by telephone and the availability of in person appointments. I also discussed with the patient that there may be a patient responsible charge related to this service. The patient expressed understanding and verbally consented to this telephonic visit.    Interactive audio and video telecommunications were attempted between this provider and patient, however failed, due to patient having technical difficulties OR patient did not have access to video capability.  We continued and completed visit with audio only.  Some vital signs may be absent or patient reported.   Time Spent with patient on telephone encounter: 20 minutes  Subjective:   Ariel Simpson is a 64 y.o. female who presents for Medicare Annual (Subsequent) preventive examination.  Review of Systems    No ROS. Medicare Wellness Visit. Additional risk factors are reflected in social history. Cardiac Risk Factors include: advanced age (>49men, >5 women);dyslipidemia;family history of premature cardiovascular disease Sleep Patterns: Issues with insomnia, sleeps between 5-6 hours nightly. Home Safety/Smoke Alarms: Feels safe in home; uses home alarm. Smoke alarms in place. Living environment: 2-story home; Lives with spouse; no needs for DME; good support system. Seat Belt Safety/Bike Helmet: Wears seat belt.    Objective:    There were no vitals filed for this visit. There is no height or weight on file to calculate BMI.  Advanced Directives 09/14/2020 01/16/2020 08/26/2019 03/18/2013 03/12/2013 03/03/2013  Does Patient Have a Medical Advance Directive?  Yes Yes Yes Patient has advance directive, copy in chart Patient has advance directive, copy not in chart Patient has advance directive, copy not in chart  Type of Advance Directive Living will;Healthcare Power of [place];Living will [place];Living will [place];Living will [place];Living will [place];Living will  Does patient want to make changes to medical advance directive? No - Patient declined - - - - -  Copy of [place] in Chart? No - copy requested - Yes - validated most recent copy scanned in chart (See row information) - Copy requested from family Copy requested from family  Pre-existing out of facility DNR order (yellow form or pink MOST form) - - - No - -    Current Medications (verified) Outpatient Encounter Medications as of 09/14/2020  Medication Sig  . amoxicillin (AMOXIL) 500 MG capsule TAKE 4 CAPSULES BY MOUTH 1 HOUR PRIOR TO DENTAL APPOINTMENT  . aspirin 81 MG tablet Take 1 tablet (81 mg total) by mouth daily.  Marland Kitchen atorvastatin (LIPITOR) 20 MG tablet TAKE 1 TABLET BY MOUTH DAILY  . Calcium Carbonate-Vitamin D (CALCIUM + D PO) Take 1 capsule by mouth daily.   . Melatonin 10 MG TABS Take by mouth.  . metoprolol succinate (TOPROL-XL) 50 MG 24 hr tablet Take 1 tablet (50 mg total) by mouth daily. Take with or immediately following a meal.  . Misc Natural Products (GLUCOSAMINE CHOND COMPLEX/MSM PO) Take 1 tablet by mouth daily.  . Omega-3 Fatty Acids (FISH OIL PO) Take 2 capsules by mouth daily.  . polyethylene glycol (MIRALAX / GLYCOLAX) packet Take 17 g by mouth daily.  Marland Kitchen spironolactone-hydrochlorothiazide (ALDACTAZIDE) 25-25 MG tablet Take 0.5  tablets by mouth daily. (Patient not taking: Reported on 09/06/2020)  . TURMERIC PO Take 1 tablet by mouth daily.   Marland Kitchen zolpidem (AMBIEN) 5 MG tablet TAKE 1/2 TABLET BY MOUTH AT NIGHT AS NEEDED FOR SLEEP   No  facility-administered encounter medications on file as of 09/14/2020.    Allergies (verified) Patient has no known allergies.   History: Past Medical History:  Diagnosis Date  . Arthritis   . Benign essential tremor   . CHF (congestive heart failure) ([place])   . Heart murmur   . High cholesterol   . Hypertension   . Insomnia   . Mitral regurgitation   . Mitral valve prolapse   . Osteopenia   . Paroxysmal atrial fibrillation ([place]) 03/11/2013  . Paroxysmal supraventricular tachycardia ([place])   . PONV (postoperative nausea and vomiting)    Dizziness and Nausea  . S/P Maze operation for atrial fibrillation 03/18/2013   Complete bilateral atrial lesion set using cryothermy and biopolar radiofrequency ablation with oversewing of LA appendage via right mini thoracotomy  . S/P mitral valve repair 03/18/2013   Complex valvuloplasty including triangular resection of posterior leaflet, artificial Gore-tex neocord placement x4 and 71mm Sorin Memo 3D ring annuloplasty via right mini thoracotomy  . Severe mitral regurgitation 03/03/2013   Acute on chronic with acute heart failure   . Shortness of breath   . Wears glasses    Past Surgical History:  Procedure Laterality Date  . ABDOMINAL HYSTERECTOMY     hernia repair  . BIOPSY  08/26/2019   Procedure: BIOPSY;  Surgeon: Wilford Corner, MD;  Location: [facility];  Service: Endoscopy;;  . CARDIAC CATHETERIZATION    . COLONOSCOPY    . COLONOSCOPY WITH PROPOFOL N/A 08/26/2019   Procedure: COLONOSCOPY WITH PROPOFOL;  Surgeon: Wilford Corner, MD;  Location: [facility];  Service: Endoscopy;  Laterality: N/A;  . INTRAOPERATIVE TRANSESOPHAGEAL ECHOCARDIOGRAM N/A 03/18/2013   Procedure: INTRAOPERATIVE TRANSESOPHAGEAL ECHOCARDIOGRAM;  Surgeon: Rexene Alberts, MD;  Location: [place];  Service: Open Heart Surgery;  Laterality: N/A;  . LEFT AND RIGHT HEART CATHETERIZATION WITH CORONARY ANGIOGRAM N/A 03/06/2013   Procedure: LEFT AND RIGHT HEART  CATHETERIZATION WITH CORONARY ANGIOGRAM;  Surgeon: Sinclair Grooms, MD;  Location[facility];  Service: Cardiovascular;  Laterality: N/A;  . MAZE N/A 03/18/2013   Procedure: MAZE;  Surgeon: Rexene Alberts, MD;  Location: [place];  Service: Open Heart Surgery;  Laterality: N/A;  . MITRAL VALVE REPAIR Right 03/18/2013   Procedure: MINIMALLY INVASIVE MITRAL VALVE REPAIR (MVR);  Surgeon: Rexene Alberts, MD;  Location: [place];  Service: Open Heart Surgery;  Laterality: Right;  Marland Kitchen VARICOSE VEIN SURGERY    . WISDOM TOOTH EXTRACTION     Family History  Problem Relation Age of Onset  . Pancreatic cancer Mother   . Stomach cancer Father   . Hypertension Father   . Thyroid cancer Sister   . Colon cancer Paternal Grandmother   . Stomach cancer Paternal Grandfather   . Seizures Child   . Down syndrome Child    Social History   Socioeconomic History  . Marital status: Married    Spouse name: Fritz Pickerel  . Number of children: 3  . Years of education: BS  . Highest education level: Not on file  Occupational History    Employer: [employer]  Tobacco Use  . Smoking status: Never Smoker  . Smokeless tobacco: Never Used  Vaping Use  . Vaping Use: Never used  Substance and  Sexual Activity  . Alcohol use: Yes    Alcohol/week: 5.0 - 6.0 standard drinks    Types: 5 - 6 Glasses of wine per week    Comment: 1 drink/day  . Drug use: No  . Sexual activity: Yes    Birth control/protection: Surgical  Other Topics Concern  . Not on file  Social History Narrative   Patient lives at home with her husband  Fritz Pickerel)  - second husband. Patient has college education B.S. 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   Caffeine- one cup daily.   Social Determinants of Health   Financial Resource Strain: Low Risk   . Difficulty of Paying Living Expenses: Not hard at all  Food Insecurity: No Food Insecurity  . Worried About Charity fundraiser in the Last Year: Never true  .  Ran Out of Food in the Last Year: Never true  Transportation Needs: No Transportation Needs  . Lack of Transportation (Medical): No  . Lack of Transportation (Non-Medical): No  Physical Activity: Sufficiently Active  . Days of Exercise per Week: 7 days  . Minutes of Exercise per Session: 60 min  Stress: Stress Concern Present  . Feeling of Stress : Very much  Social Connections: Unknown  . Frequency of Communication with Friends and Family: More than three times a week  . Frequency of Social Gatherings with Friends and Family: Once a week  . Attends Religious Services: Patient refused  . Active Member of Clubs or Organizations: Patient refused  . Attends Archivist Meetings: Patient refused  . Marital Status: Married    Tobacco Counseling Counseling given: Not Answered   Clinical Intake:  Pre-visit preparation completed: Yes  Pain : No/denies pain     Nutritional Risks: None Diabetes: No  How often do you need to have someone help you when you read instructions, pamphlets, or other written materials from your doctor or pharmacy?: 1 - Never What is the last grade level you completed in school?: Bachelor's Degree  Diabetic? no  Interpreter Needed?: No  Information entered by :: Ariel Abu, LPN   Activities of Daily Living In your present state of health, do you have any difficulty performing the following activities: 09/14/2020 09/06/2020  Hearing? N N  Vision? N N  Difficulty concentrating or making decisions? N N  Walking or climbing stairs? N N  Dressing or bathing? N N  Doing errands, shopping? N N  Preparing Food and eating ? N -  Using the Toilet? N -  In the past six months, have you accidently leaked urine? N -  Do you have problems with loss of bowel control? N -  Managing your Medications? N -  Managing your Finances? N -  Housekeeping or managing your Housekeeping? N -  Some recent data might be hidden    Patient Care Team: Binnie Rail, MD as PCP - General (Internal Medicine) Belva Crome, MD as PCP - Cardiology (Cardiology) Rexene Alberts, MD as Consulting Physician (Cardiothoracic Surgery) Belva Crome, MD as Consulting Physician (Cardiology) Tat, Eustace Quail, DO as Consulting Physician (Neurology) Charlton Haws, [facility] as Pharmacist (Pharmacist)  Indicate any recent Medical Services you may have received from other than [facility] providers in the past year (date may be approximate).     Assessment:   This is a routine wellness examination for [place].  Hearing/Vision screen No exam data present  Dietary issues and exercise activities  discussed: Current Exercise Habits: Structured exercise class, Type of exercise: yoga, Time (Minutes): 60, Frequency (Times/Week): 7, Weekly Exercise (Minutes/Week): 420, Intensity: Moderate, Exercise limited by: None identified  Goals    . Patient Stated     To maintain my current health status by continuing to eat healthy, stay physically active and socially active.      Depression Screen PHQ 2/9 Scores 09/14/2020 09/06/2020 09/05/2019 02/19/2018 07/28/2013 04/16/2013  PHQ - 2 Score 1 0 0 0 0 0    Fall Risk Fall Risk  09/14/2020 01/16/2020 09/05/2019 02/21/2018 02/19/2018  Falls in the past year? 0 0 0 Yes No  Number falls in past yr: 0 0 0 1 -  Injury with Fall? 0 0 - No -  Risk for fall due to : No Fall Risks - - - -  Follow up Falls evaluation completed - - Falls evaluation completed -    Any stairs in or around the home? Yes  If so, are there any without handrails? No  Home free of loose throw rugs in walkways, pet beds, electrical cords, etc? Yes  Adequate lighting in your home to reduce risk of falls? Yes   ASSISTIVE DEVICES UTILIZED TO PREVENT FALLS:  Life alert? No  Use of a cane, walker or w/c? No  Grab bars in the bathroom? Yes  Shower chair or bench in shower? Yes  Elevated toilet seat or a handicapped toilet? Yes   TIMED UP AND GO:  Was the test  performed? No .  Length of time to ambulate 10 feet: 0 sec.   Gait steady and fast without use of assistive device  Cognitive Function: Patient is cogitatively intact.        Immunizations Immunization History  Administered Date(s) Administered  . Fluad Quad(high Dose 65+) 09/05/2019, 09/06/2020  . Influenza-Unspecified 08/20/2017  . PFIZER SARS-COV-2 Vaccination 12/12/2019, 12/31/2019, 08/24/2020  . Tdap 04/08/2012  . Zoster 04/07/2015    TDAP status: Up to date Flu Vaccine status: Up to date Pneumococcal vaccine status: Declined,  Education has been provided regarding the importance of this vaccine but patient still declined. Advised may receive this vaccine at local pharmacy or Health Dept. Aware to provide a copy of the vaccination record if obtained from local pharmacy or Health Dept. Verbalized acceptance and understanding.  Covid-19 vaccine status: Completed vaccines  Qualifies for Shingles Vaccine? Yes   Zostavax completed Yes   Shingrix Completed?: No.    Education has been provided regarding the importance of this vaccine. Patient has been advised to call insurance company to determine out of pocket expense if they have not yet received this vaccine. Advised may also receive vaccine at local pharmacy or Health Dept. Verbalized acceptance and understanding.  Screening Tests Health Maintenance  Topic Date Due  . PNA vac Low Risk Adult (1 of 2 - PCV13) Never done  . MAMMOGRAM  02/02/2021  . DEXA SCAN  02/12/2022  . TETANUS/TDAP  04/08/2022  . COLONOSCOPY  08/25/2024  . INFLUENZA VACCINE  Completed  . COVID-19 Vaccine  Completed  . Hepatitis C Screening  Completed    Health Maintenance  Health Maintenance Due  Topic Date Due  . PNA vac Low Risk Adult (1 of 2 - PCV13) Never done    Colorectal cancer screening: Completed 08/26/2019. Repeat every 5 years Mammogram status: Completed 02/13/2020. Repeat every year Bone Density status: Completed 02/13/2020. Results  reflect: Bone density results: OSTEOPENIA. Repeat every 2 years.  Lung Cancer Screening: (Low Dose CT Chest  recommended if Age 44-80 years, 63 pack-year currently smoking OR have quit w/in 15years.) does not qualify.   Lung Cancer Screening Referral: no  Additional Screening:  Hepatitis C Screening: does qualify; Completed yes  Vision Screening: Recommended annual ophthalmology exams for early detection of glaucoma and other disorders of the eye. Is the patient up to date with their annual eye exam?  Yes  Who is the provider or what is the name of the office in which the patient attends annual eye exams? [facility] If pt is not established with a provider, would they like to be referred to a provider to establish care? No .   Dental Screening: Recommended annual dental exams for proper oral hygiene  Community Resource Referral / Chronic Care Management: CRR required this visit?  No   CCM required this visit?  No      Plan:     I have personally reviewed and noted the following in the patient's chart:   . Medical and social history . Use of alcohol, tobacco or illicit drugs  . Current medications and supplements . Functional ability and status . Nutritional status . Physical activity . Advanced directives . List of other physicians . Hospitalizations, surgeries, and ER visits in previous 12 months . Vitals . Screenings to include cognitive, depression, and falls . Referrals and appointments  In addition, I have reviewed and discussed with patient certain preventive protocols, quality metrics, and best practice recommendations. A written personalized care plan for preventive services as well as general preventive health recommendations were provided to patient.     Sheral Flow, LPN   [id number]/83/[id number]   Nurse Notes:  Patient is cogitatively intact. There were no vitals filed for this visit. There is no height or weight on file to calculate BMI. Patient  stated that she has no issues with gait or balance; does not use any assistive devices.

## 2020-09-14 NOTE — Patient Instructions (Addendum)
Ariel Simpson , Thank you for taking time to come for your Medicare Wellness Visit. I appreciate your ongoing commitment to your health goals. Please review the following plan we discussed and let me know if I can assist you in the future.   Screening recommendations/referrals: Colonoscopy: 08/26/2019; due every 5 years Mammogram: 02/13/2020 Bone Density: 02/13/2020 Recommended yearly ophthalmology/optometry visit for glaucoma screening and checkup Recommended yearly dental visit for hygiene and checkup  Vaccinations: Influenza vaccine: 09/06/2020 Pneumococcal vaccine: never done Tdap vaccine: 04/08/2012; due every 10 years Shingles vaccine: never done   Covid-19: completed with booster  Advanced directives: Please bring a copy of your health care power of attorney and living will to the office at your convenience.  Conditions/risks identified: Yes; Reviewed health maintenance screenings with patient today and relevant education, vaccines, and/or referrals were provided. Please continue to do your personal lifestyle choices by: daily care of teeth and gums, regular physical activity (goal should be 5 days a week for 30 minutes), eat a healthy diet, avoid tobacco and drug use, limiting any alcohol intake, taking a low-dose aspirin (if not allergic or have been advised by your provider otherwise) and taking vitamins and minerals as recommended by your provider. Continue doing brain stimulating activities (puzzles, reading, adult coloring books, staying active) to keep memory sharp. Continue to eat heart healthy diet (full of fruits, vegetables, whole grains, lean protein, water--limit salt, fat, and sugar intake) and increase physical activity as tolerated.  Next appointment: Please schedule your next Medicare Wellness Visit with your Nurse Health Advisor in 1 year by calling [phone].  Preventive Care 57 Years and Older, Female Preventive care refers to lifestyle choices and visits with your  health care provider that can promote health and wellness. What does preventive care include?  A yearly physical exam. This is also called an annual well check.  Dental exams once or twice a year.  Routine eye exams. Ask your health care provider how often you should have your eyes checked.  Personal lifestyle choices, including:  Daily care of your teeth and gums.  Regular physical activity.  Eating a healthy diet.  Avoiding tobacco and drug use.  Limiting alcohol use.  Practicing safe sex.  Taking low-dose aspirin every day.  Taking vitamin and mineral supplements as recommended by your health care provider. What happens during an annual well check? The services and screenings done by your health care provider during your annual well check will depend on your age, overall health, lifestyle risk factors, and family history of disease. Counseling  Your health care provider may ask you questions about your:  Alcohol use.  Tobacco use.  Drug use.  Emotional well-being.  Home and relationship well-being.  Sexual activity.  Eating habits.  History of falls.  Memory and ability to understand (cognition).  Work and work Statistician.  Reproductive health. Screening  You may have the following tests or measurements:  Height, weight, and BMI.  Blood pressure.  Lipid and cholesterol levels. These may be checked every 5 years, or more frequently if you are over 93 years old.  Skin check.  Lung cancer screening. You may have this screening every year starting at age 83 if you have a 30-pack-year history of smoking and currently smoke or have quit within the past 15 years.  Fecal occult blood test (FOBT) of the stool. You may have this test every year starting at age 46.  Flexible sigmoidoscopy or colonoscopy. You may have a sigmoidoscopy every 5 years or  a colonoscopy every 10 years starting at age 11.  Hepatitis C blood test.  Hepatitis B blood  test.  Sexually transmitted disease (STD) testing.  Diabetes screening. This is done by checking your blood sugar (glucose) after you have not eaten for a while (fasting). You may have this done every 1-3 years.  Bone density scan. This is done to screen for osteoporosis. You may have this done starting at age 75.  Mammogram. This may be done every 1-2 years. Talk to your health care provider about how often you should have regular mammograms. Talk with your health care provider about your test results, treatment options, and if necessary, the need for more tests. Vaccines  Your health care provider may recommend certain vaccines, such as:  Influenza vaccine. This is recommended every year.  Tetanus, diphtheria, and acellular pertussis (Tdap, Td) vaccine. You may need a Td booster every 10 years.  Zoster vaccine. You may need this after age 22.  Pneumococcal 13-valent conjugate (PCV13) vaccine. One dose is recommended after age 12.  Pneumococcal polysaccharide (PPSV23) vaccine. One dose is recommended after age 20. Talk to your health care provider about which screenings and vaccines you need and how often you need them. This information is not intended to replace advice given to you by your health care provider. Make sure you discuss any questions you have with your health care provider. Document Released: 12/03/2015 Document Revised: 07/26/2016 Document Reviewed: 09/07/2015 Elsevier Interactive Patient Education  2017 [place] Prevention in the Home Falls can cause injuries. They can happen to people of all ages. There are many things you can do to make your home safe and to help prevent falls. What can I do on the outside of my home?  Regularly fix the edges of walkways and driveways and fix any cracks.  Remove anything that might make you trip as you walk through a door, such as a raised step or threshold.  Trim any bushes or trees on the path to your home.  Use  bright outdoor lighting.  Clear any walking paths of anything that might make someone trip, such as rocks or tools.  Regularly check to see if handrails are loose or broken. Make sure that both sides of any steps have handrails.  Any raised decks and porches should have guardrails on the edges.  Have any leaves, snow, or ice cleared regularly.  Use sand or salt on walking paths during winter.  Clean up any spills in your garage right away. This includes oil or grease spills. What can I do in the bathroom?  Use night lights.  Install grab bars by the toilet and in the tub and shower. Do not use towel bars as grab bars.  Use non-skid mats or decals in the tub or shower.  If you need to sit down in the shower, use a plastic, non-slip stool.  Keep the floor dry. Clean up any water that spills on the floor as soon as it happens.  Remove soap buildup in the tub or shower regularly.  Attach bath mats securely with double-sided non-slip rug tape.  Do not have throw rugs and other things on the floor that can make you trip. What can I do in the bedroom?  Use night lights.  Make sure that you have a light by your bed that is easy to reach.  Do not use any sheets or blankets that are too big for your bed. They should not hang down onto  the floor.  Have a firm chair that has side arms. You can use this for support while you get dressed.  Do not have throw rugs and other things on the floor that can make you trip. What can I do in the kitchen?  Clean up any spills right away.  Avoid walking on wet floors.  Keep items that you use a lot in easy-to-reach places.  If you need to reach something above you, use a strong step stool that has a grab bar.  Keep electrical cords out of the way.  Do not use floor polish or wax that makes floors slippery. If you must use wax, use non-skid floor wax.  Do not have throw rugs and other things on the floor that can make you trip. What can  I do with my stairs?  Do not leave any items on the stairs.  Make sure that there are handrails on both sides of the stairs and use them. Fix handrails that are broken or loose. Make sure that handrails are as long as the stairways.  Check any carpeting to make sure that it is firmly attached to the stairs. Fix any carpet that is loose or worn.  Avoid having throw rugs at the top or bottom of the stairs. If you do have throw rugs, attach them to the floor with carpet tape.  Make sure that you have a light switch at the top of the stairs and the bottom of the stairs. If you do not have them, ask someone to add them for you. What else can I do to help prevent falls?  Wear shoes that:  Do not have high heels.  Have rubber bottoms.  Are comfortable and fit you well.  Are closed at the toe. Do not wear sandals.  If you use a stepladder:  Make sure that it is fully opened. Do not climb a closed stepladder.  Make sure that both sides of the stepladder are locked into place.  Ask someone to hold it for you, if possible.  Clearly mark and make sure that you can see:  Any grab bars or handrails.  First and last steps.  Where the edge of each step is.  Use tools that help you move around (mobility aids) if they are needed. These include:  Canes.  Walkers.  Scooters.  Crutches.  Turn on the lights when you go into a dark area. Replace any light bulbs as soon as they burn out.  Set up your furniture so you have a clear path. Avoid moving your furniture around.  If any of your floors are uneven, fix them.  If there are any pets around you, be aware of where they are.  Review your medicines with your doctor. Some medicines can make you feel dizzy. This can increase your chance of falling. Ask your doctor what other things that you can do to help prevent falls. This information is not intended to replace advice given to you by your health care provider. Make sure you  discuss any questions you have with your health care provider. Document Released: 09/02/2009 Document Revised: 04/13/2016 Document Reviewed: 12/11/2014 Elsevier Interactive Patient Education  2017 [employer].

## 2020-09-15 ENCOUNTER — Other Ambulatory Visit: Payer: Self-pay

## 2020-09-15 ENCOUNTER — Ambulatory Visit: Payer: Medicare Other | Admitting: Interventional Cardiology

## 2020-09-15 ENCOUNTER — Encounter: Payer: Self-pay | Admitting: Interventional Cardiology

## 2020-09-15 VITALS — BP 146/90 | HR 60 | Ht 67.0 in | Wt 164.6 lb

## 2020-09-15 DIAGNOSIS — R55 Syncope and collapse: Secondary | ICD-10-CM

## 2020-09-15 DIAGNOSIS — Z9889 Other specified postprocedural states: Secondary | ICD-10-CM | POA: Diagnosis not present

## 2020-09-15 DIAGNOSIS — Z8679 Personal history of other diseases of the circulatory system: Secondary | ICD-10-CM

## 2020-09-15 DIAGNOSIS — I10 Essential (primary) hypertension: Secondary | ICD-10-CM

## 2020-09-15 DIAGNOSIS — I480 Paroxysmal atrial fibrillation: Secondary | ICD-10-CM | POA: Diagnosis not present

## 2020-09-15 DIAGNOSIS — E7849 Other hyperlipidemia: Secondary | ICD-10-CM | POA: Diagnosis not present

## 2020-09-15 DIAGNOSIS — Z7189 Other specified counseling: Secondary | ICD-10-CM

## 2020-09-15 NOTE — Patient Instructions (Signed)

## 2020-09-17 ENCOUNTER — Telehealth: Payer: Self-pay | Admitting: Primary Care

## 2020-09-17 NOTE — Telephone Encounter (Signed)
Hi needs pa for cyclobenzaprine

## 2020-09-20 NOTE — Telephone Encounter (Signed)
Approvedon October 29  PA Case: [id number], Status: Approved, Coverage Starts on: 06/18/2020 12:00:00 AM, Coverage Ends on: 09/17/2021 12:00:00 AM.

## 2020-09-30 ENCOUNTER — Ambulatory Visit: Payer: Medicare Other | Attending: Primary Care

## 2020-09-30 ENCOUNTER — Other Ambulatory Visit: Payer: Self-pay | Admitting: Internal Medicine

## 2020-09-30 DIAGNOSIS — Z23 Encounter for immunization: Secondary | ICD-10-CM | POA: Insufficient documentation

## 2020-09-30 NOTE — Progress Notes (Signed)
Pt tolerated flu shot, 1/2 dose injection as part of routine health maintenance.

## 2020-10-01 ENCOUNTER — Other Ambulatory Visit: Payer: Self-pay | Admitting: Primary Care

## 2020-10-01 MED ORDER — AMLODIPINE BESYLATE 2.5 MG PO TABS *I*
2.5000 mg | ORAL_TABLET | Freq: Every day | ORAL | 3 refills | Status: DC
Start: 2020-10-01 — End: 2020-10-04

## 2020-10-04 ENCOUNTER — Other Ambulatory Visit: Payer: Self-pay | Admitting: Primary Care

## 2020-10-04 ENCOUNTER — Other Ambulatory Visit: Payer: Self-pay | Admitting: *Deleted

## 2020-10-04 MED ORDER — AMLODIPINE BESYLATE 2.5 MG PO TABS *I*
2.5000 mg | ORAL_TABLET | Freq: Every day | ORAL | 3 refills | Status: DC
Start: 2020-10-04 — End: 2021-08-17

## 2020-10-04 MED ORDER — METOPROLOL SUCCINATE ER 50 MG PO TB24
50.0000 mg | ORAL_TABLET | Freq: Every day | ORAL | 0 refills | Status: DC
Start: 2020-10-04 — End: 2020-10-20

## 2020-10-07 ENCOUNTER — Other Ambulatory Visit: Payer: Self-pay | Admitting: Neurology

## 2020-10-09 ENCOUNTER — Other Ambulatory Visit: Payer: Self-pay | Admitting: Primary Care

## 2020-10-11 ENCOUNTER — Other Ambulatory Visit: Payer: Self-pay | Admitting: Gastroenterology

## 2020-10-11 ENCOUNTER — Telehealth: Payer: Self-pay | Admitting: Primary Care

## 2020-10-11 ENCOUNTER — Other Ambulatory Visit
Admission: RE | Admit: 2020-10-11 | Discharge: 2020-10-11 | Disposition: A | Discharge status: Home / Self Care | Payer: Medicare Other | Source: Ambulatory Visit | Attending: Primary Care | Admitting: Primary Care

## 2020-10-11 ENCOUNTER — Ambulatory Visit: Payer: Medicare Other

## 2020-10-11 DIAGNOSIS — G40909 Epilepsy, unspecified, not intractable, without status epilepticus: Secondary | ICD-10-CM

## 2020-10-11 DIAGNOSIS — R350 Frequency of micturition: Secondary | ICD-10-CM | POA: Insufficient documentation

## 2020-10-11 LAB — URINALYSIS WITH MICROSCOPIC
Bacteria,UA: NONE SEEN
Blood,UA: NEGATIVE
Glucose,UA: NEGATIVE
Hyaline Casts,UA: NONE SEEN /lpf (ref 0–5)
Ketones, UA: NEGATIVE
Nitrite,UA: NEGATIVE
Protein,UA: NEGATIVE
Specific Gravity,UA: 1.018 (ref 1.002–1.030)
pH,UA: 6 (ref 5.0–8.0)

## 2020-10-11 NOTE — Telephone Encounter (Signed)
Thank you :)

## 2020-10-11 NOTE — Telephone Encounter (Signed)
Pt complains of urinary frequency, urgency and unable to fully empty bladder. Wants to leave sample at lab .  Orders entered.

## 2020-10-11 NOTE — Chronic Care Management (AMB) (Unsigned)
Chronic Care Management Pharmacy  Name: Ariel Simpson  MRN: [id number] DOB: 04-Jan-1956   Chief Complaint/ HPI  Ariel Simpson,  64 y.o. , female presents for their Initial CCM visit with the clinical pharmacist via telephone due to COVID-19 Pandemic.  PCP : Binnie Rail, MD Patient Care Team: Binnie Rail, MD as PCP - General (Internal Medicine) Belva Crome, MD as PCP - Cardiology (Cardiology) Rexene Alberts, MD as Consulting Physician (Cardiothoracic Surgery) Belva Crome, MD as Consulting Physician (Cardiology) Tat, Eustace Quail, DO as Consulting Physician (Neurology) Charlton Haws, [facility] as Pharmacist (Pharmacist) Melissa Noon, OD as Referring Physician (Optometry)  Their chronic conditions include: Hyperlipidemia, Atrial Fibrillation and Osteopenia   Office Visits: 09/06/20 Dr Quay Burow OV: 3 episodes of hypotension, holding diuretics, wearing Holter monitor  Consult Visit: 09/15/20 Dr Tamala Julian (cardiology): near syncope, diuretic therapy discontinued, no recurrent episodes.   08/19/20 PA Meng (cardiology):   No Known Allergies  Medications: Outpatient Encounter Medications as of 10/11/2020  Medication Sig  . amoxicillin (AMOXIL) 500 MG capsule TAKE 4 CAPSULES BY MOUTH 1 HOUR PRIOR TO DENTAL APPOINTMENT  . aspirin 81 MG tablet Take 1 tablet (81 mg total) by mouth daily.  Marland Kitchen atorvastatin (LIPITOR) 20 MG tablet TAKE 1 TABLET BY MOUTH DAILY  . Calcium Carbonate-Vitamin D (CALCIUM + D PO) Take 1 capsule by mouth daily.   . Melatonin 10 MG TABS Take by mouth.  . metoprolol succinate (TOPROL-XL) 50 MG 24 hr tablet Take 1 tablet (50 mg total) by mouth daily. Take with or immediately following a meal.  . Misc Natural Products (GLUCOSAMINE CHOND COMPLEX/MSM PO) Take 1 tablet by mouth daily.  . Omega-3 Fatty Acids (FISH OIL PO) Take 2 capsules by mouth daily.  . polyethylene glycol (MIRALAX / GLYCOLAX) packet Take 17 g by mouth daily.  Marland Kitchen  spironolactone-hydrochlorothiazide (ALDACTAZIDE) 25-25 MG tablet Take 0.5 tablets by mouth daily. (Patient not taking: Reported on 09/15/2020)  . TURMERIC PO Take 1 tablet by mouth daily.   Marland Kitchen zolpidem (AMBIEN) 5 MG tablet TAKE 1/2 TABLET BY MOUTH AT NIGHT AS NEEDED FOR SLEEP   No facility-administered encounter medications on file as of 10/11/2020.    Wt Readings from Last 3 Encounters:  09/15/20 164 lb 9.6 oz (74.7 kg)  09/06/20 166 lb (75.3 kg)  08/19/20 166 lb (75.3 kg)    Current Diagnosis/Assessment:    Goals Addressed   None     AFIB   S/p Maze procedure. No recurrence. No anticoagulation  Patient is currently rate controlled. Office heart rates are  Pulse Readings from Last 3 Encounters:  09/15/20 60  09/06/20 (!) 56  08/19/20 73   CHA2DS2-VASc Score = 2  The patient's score is based upon: CHF History: 0 HTN History: 0 Diabetes History: 0 Stroke History: 0 Vascular Disease History: 0 Age Score: 1 Gender Score: 1  BP goal is:  <130/80  Office blood pressures are  BP Readings from Last 3 Encounters:  09/15/20 (!) 146/90  09/06/20 130/82  08/19/20 132/74   Patient checks BP at home {CHL HP BP Monitoring Frequency:[phone]} Patient home BP readings are ranging: ***  Patient has failed these meds in past: *** Patient is currently {CHL Controlled/Uncontrolled:[phone]} on the following medications:  Marland Kitchen Metoprolol sucinate 50 mg daily . Spironolactone-HCTZ 25-25 mg - 1/2 tab daily (holding) . Aspirin 81 mg daily  We discussed:  {CHL HP Upstream Pharmacy discussion:[phone]}  Plan  Continue {CHL HP Upstream Pharmacy SWHQP:[id number]}  Hyperlipidemia   LDL goal < 100  Last lipids Lab Results  Component Value Date   CHOL 144 09/06/2020   HDL 51.50 09/06/2020   LDLCALC 73 09/06/2020   TRIG 95.0 09/06/2020   CHOLHDL 3 09/06/2020   Hepatic Function Latest Ref Rng & Units 09/06/2020 09/05/2019 08/25/2019  Total Protein 6.0 - 8.3 g/dL 6.7  7.0 6.7  Albumin 3.5 - 5.2 g/dL 4.4 4.6 4.5  AST 0 - 37 U/L _0 ALT 0 - 35 U/L _1 Alk Phosphatase 39 - 117 U/L 60 61 74  Total Bilirubin 0.2 - 1.2 mg/dL 0.6 0.7 0.6     The 10-year ASCVD risk score Mikey Bussing DC Jr., et al., 2013) is: 9.4%   Values used to calculate the score:     Age: 80 years     Sex: Female     Is Non-Hispanic African American: No     Diabetic: No     Tobacco smoker: No     Systolic Blood Pressure: 102 mmHg     Is BP treated: Yes     HDL Cholesterol: 51.5 mg/dL     Total Cholesterol: 144 mg/dL   Patient has failed these meds in past: *** Patient is currently {CHL Controlled/Uncontrolled:[phone]} on the following medications:  . Atorvastatin 20 mg daily . Omega-3 Fatty Acids 2 cap daily  We discussed:  {CHL HP Upstream Pharmacy discussion:[phone]}  Plan  Continue {CHL HP Upstream Pharmacy [phone]}  Insomnia   Patient has failed these meds in past: *** Patient is currently {CHL Controlled/Uncontrolled:[phone]} on the following medications:  Marland Kitchen Zolpidem 5 mg - 1/2 tab HS prn . Melatonin 10 mg HS  We discussed:  ***  Plan  Continue {CHL HP Upstream Pharmacy [phone]}  Osteopenia   Last DEXA Scan: 02/13/20  T-Score femoral neck: -1.7  T-Score total hip: -1.1  T-Score lumbar spine: -0.7  T-Score forearm radius: n/a  10-year probability of major osteoporotic fracture: 9.8%  10-year probability of hip fracture: 1.3%  Last vitamin D/Calcium Lab Results  Component Value Date   VD25OH 33.29 09/06/2020   CALCIUM 9.6 09/06/2020    Patient is not a candidate for pharmacologic treatment  Patient has failed these meds in past: n/a Patient is currently {CHL Controlled/Uncontrolled:[phone]} on the following medications:  . Calcium carbonate-Vitamin D  We discussed:  {Osteoporosis Counseling:23892}  Plan  Continue {CHL HP Upstream Pharmacy [phone]}  Health Maintenance   Patient is currently {CHL  Controlled/Uncontrolled:[phone]} on the following medications:  Marland Kitchen Miralax PRN . Glucosamine-chondroitin . Turmeric   We discussed:  ***  Plan  Continue {CHL HP Upstream Pharmacy VOZDG:[id number]}  Medication Management   Patient's preferred pharmacy is:  [id number] [address], [place] - [address] [place] [address] Phone: [phone] Fax: [phone]  Uses pill box? {Yes or If no, why not?:20788} Pt endorses ***% compliance  We discussed: {Pharmacy options:24294}  Plan  {US Pharmacy FTDD:22025}    Follow up: *** month phone visit  ***

## 2020-10-12 ENCOUNTER — Telehealth: Payer: Self-pay | Admitting: Primary Care

## 2020-10-12 LAB — AEROBIC CULTURE: Aerobic Culture: 0

## 2020-10-12 MED ORDER — FLUCONAZOLE 150 MG PO TABS *I*
ORAL_TABLET | ORAL | 0 refills | Status: DC
Start: 2020-10-12 — End: 2021-08-17

## 2020-10-12 NOTE — Telephone Encounter (Signed)
Patient reports per Dr. Meyer Cory instructions tried Monistat  for possible yeast infection last night while urine culture is pending. Reports hasn't taken it in a decade, has never had issues with it, but last night had severe acute burning after application for 30 minutes, and "can't imagine trying it again"

## 2020-10-12 NOTE — Telephone Encounter (Signed)
Patients aerobic came back suggestive of contamination and was released to MyChart, patient asking for advice.

## 2020-10-12 NOTE — Telephone Encounter (Signed)
I think it is best if I wait for urine culture results.  If that is negative, then will prescribe diflucan  If urine culture positive, then will proceed with antibiotics

## 2020-10-12 NOTE — Telephone Encounter (Signed)
Patient aware.

## 2020-10-12 NOTE — Telephone Encounter (Signed)
I sent Diflucan to [facility]  She will take one , wait three days and if sx persist take another one  She is to hold atorvastatin for one week as they can interact

## 2020-10-18 ENCOUNTER — Other Ambulatory Visit: Payer: Self-pay | Admitting: *Deleted

## 2020-10-20 ENCOUNTER — Telehealth (INDEPENDENT_AMBULATORY_CARE_PROVIDER_SITE_OTHER): Payer: Medicare Other | Admitting: Physician Assistant

## 2020-10-20 ENCOUNTER — Encounter: Payer: Self-pay | Admitting: Physician Assistant

## 2020-10-20 ENCOUNTER — Other Ambulatory Visit: Payer: Self-pay | Admitting: Physician Assistant

## 2020-10-20 ENCOUNTER — Telehealth: Payer: Self-pay

## 2020-10-20 ENCOUNTER — Telehealth: Payer: Self-pay | Admitting: Interventional Cardiology

## 2020-10-20 DIAGNOSIS — E7849 Other hyperlipidemia: Secondary | ICD-10-CM

## 2020-10-20 DIAGNOSIS — I471 Supraventricular tachycardia, unspecified: Secondary | ICD-10-CM

## 2020-10-20 DIAGNOSIS — Z9889 Other specified postprocedural states: Secondary | ICD-10-CM | POA: Diagnosis not present

## 2020-10-20 DIAGNOSIS — I10 Essential (primary) hypertension: Secondary | ICD-10-CM | POA: Diagnosis not present

## 2020-10-20 DIAGNOSIS — I480 Paroxysmal atrial fibrillation: Secondary | ICD-10-CM

## 2020-10-20 DIAGNOSIS — R42 Dizziness and giddiness: Secondary | ICD-10-CM | POA: Diagnosis not present

## 2020-10-20 MED ORDER — METOPROLOL SUCCINATE ER 25 MG PO TB24
25.0000 mg | ORAL_TABLET | Freq: Two times a day (BID) | ORAL | 0 refills | Status: DC
Start: 2020-10-20 — End: 2020-11-10

## 2020-10-20 MED ORDER — METOPROLOL SUCCINATE ER 50 MG PO TB24
25.0000 mg | ORAL_TABLET | Freq: Two times a day (BID) | ORAL | 0 refills | Status: DC
Start: 2020-10-20 — End: 2020-10-20

## 2020-10-20 NOTE — Telephone Encounter (Signed)
Sent over new Rx for 25 mg tablet BID to patients pharmacy.  Called patient to inform her that a new Rx for Toprol-XL 25 mg BID was sent to the pharmacy and that she can pick it up when it is available. She thanked me for calling her back.

## 2020-10-20 NOTE — Progress Notes (Addendum)
Virtual Visit via Video Note   This visit type was conducted due to national recommendations for restrictions regarding the COVID-19 Pandemic (e.g. social distancing) in an effort to limit this patient's exposure and mitigate transmission in our community.  Due to her co-morbid illnesses, this patient is at least at moderate risk for complications without adequate follow up.  This format is felt to be most appropriate for this patient at this time.  All issues noted in this document were discussed and addressed.  A limited physical exam was performed with this format.  Please refer to the patient's chart for her consent to telehealth for [facility].       Date:  10/22/2020   ID:  Ariel Simpson, DOB Dec 18, 1955, MRN [id number] The patient was identified using 2 identifiers.  Patient Location: Home Provider Location: Home Office  PCP:  Binnie Rail, MD  Cardiologist:  Sinclair Grooms, MD  Electrophysiologist:  None   Evaluation Performed:  Follow-Up Visit  Chief Complaint:  Follow up  History of Present Illness:    Ariel Simpson is a 63 y.o. female with a hx of mitral valve prolapse and severe MR s/p mitral valve repair/annuloplasty in 2014, PAF s/p Maze procedure at the time of MVR, paroxysmal SVT, hypertension, and hyperlipidemia.  Cardiac catheterization prior to mitral valve repair in 2014 showed normal coronary arteries.  Echocardiogram performed in 2019 showed EF 55 to 60%, normal wall motion, well-functioning mitral valve repair with only trivial regurgitation, severely dilated RV with moderate TR and elevated PASP consistent with moderate pulmonary hypertension.  She split her time between [place] and [place].  He follows Dr. Charisse Klinefelter in [place] for her cardiac care while she is up [place].  Patient was seen by Sande Rives for preoperative clearance in October 2020 at which time she was doing well.  She does seem to have some degree of orthostatic  dizziness at the time.  It was recommended she stay well-hydrated.  More recently, she was seen by Dr. Charisse Klinefelter in [place] for dyspnea on exertion.   Echocardiogram and a 48-hour Holter monitor was recommended. Echocardiogram performed on 05/26/2020 showed EF 66%, stable mitral valve with mean gradient 4 mmHg, mild MR, trace AI, mild to moderate TR, estimated RVSP 37 mmHg.  When compared to the previous echocardiogram, there was no significant change. 48-hour Holter monitor demonstrated predominantly sinus bradycardia to sinus rhythm, maximum heart rate 86 bpm, minimal heart rate 43 bpm, average heart rate 53 bpm, there were 2648 PVCs which accounted for 1.67% of total heartbeat, there were also 1201 PSVTs with a burden of 0.76%.  There were 2 patient triggered event correlating with ventricular bigeminy.  She was seen by Dr. Charisse Klinefelter on 06/01/2020 who recommended continue observation.  I last saw the patient on 08/19/2020 due to episode of low blood pressure and dizziness at home.  Blood pressure obtained in the office however was normal.  Prior to the visit, symptom has recurred about 3 different times.  I recommended a 30-day event monitor, this did not reveal significant culprit to explain her symptoms.  Patient presents today for virtual visit.  She says she continued to have episodes of dizziness and fatigue since the last visit.  I explained that there was no significant arrhythmia to explain her symptoms.  She does mention her symptoms typically occur within the first 40 minutes after she take her morning blood pressure medication.  She has not had any feeling  of syncope since stopping the spironolactone.  Since her symptom is likely responding to the blood pressure medications, we discussed 2 different options.  One option is for her to split the metoprolol succinate to 25 mg twice a day, second option is to reduce the metoprolol succinate to 25 mg daily and then move the medication time to nighttime every  day.  She is hesitant to reduce the total dosage of metoprolol succinate at this time as she had exercise-induced SVT several years ago that had to be converted with adenosine.  I will prescribe 25 mg tablet of metoprolol succinate for her to take it twice a day for the time being.  I plan to see the patient back in 2 to 3 weeks for further evaluation.  Note, based on the home blood pressure reading a heart rate she sent me today, her blood pressure and heart rate is all normal.  The patient does not have symptoms concerning for COVID-19 infection (fever, chills, cough, or new shortness of breath).    Past Medical History:  Diagnosis Date  . Arthritis   . Benign essential tremor   . CHF (congestive heart failure) ([place])   . Heart murmur   . High cholesterol   . Hypertension   . Insomnia   . Mitral regurgitation   . Mitral valve prolapse   . Osteopenia   . Paroxysmal atrial fibrillation ([place]) 03/11/2013  . Paroxysmal supraventricular tachycardia ([place])   . PONV (postoperative nausea and vomiting)    Dizziness and Nausea  . S/P Maze operation for atrial fibrillation 03/18/2013   Complete bilateral atrial lesion set using cryothermy and biopolar radiofrequency ablation with oversewing of LA appendage via right mini thoracotomy  . S/P mitral valve repair 03/18/2013   Complex valvuloplasty including triangular resection of posterior leaflet, artificial Gore-tex neocord placement x4 and 53mm Sorin Memo 3D ring annuloplasty via right mini thoracotomy  . Severe mitral regurgitation 03/03/2013   Acute on chronic with acute heart failure   . Shortness of breath   . Wears glasses    Past Surgical History:  Procedure Laterality Date  . ABDOMINAL HYSTERECTOMY     hernia repair  . BIOPSY  08/26/2019   Procedure: BIOPSY;  Surgeon: Wilford Corner, MD;  Location: [facility];  Service: Endoscopy;;  . CARDIAC CATHETERIZATION    . COLONOSCOPY    . COLONOSCOPY WITH PROPOFOL N/A 08/26/2019   Procedure:  COLONOSCOPY WITH PROPOFOL;  Surgeon: Wilford Corner, MD;  Location: [facility];  Service: Endoscopy;  Laterality: N/A;  . INTRAOPERATIVE TRANSESOPHAGEAL ECHOCARDIOGRAM N/A 03/18/2013   Procedure: INTRAOPERATIVE TRANSESOPHAGEAL ECHOCARDIOGRAM;  Surgeon: Rexene Alberts, MD;  Location: [place];  Service: Open Heart Surgery;  Laterality: N/A;  . LEFT AND RIGHT HEART CATHETERIZATION WITH CORONARY ANGIOGRAM N/A 03/06/2013   Procedure: LEFT AND RIGHT HEART CATHETERIZATION WITH CORONARY ANGIOGRAM;  Surgeon: Sinclair Grooms, MD;  [facility] CATH LAB;  Service: Cardiovascular;  Laterality: N/A;  . MAZE N/A 03/18/2013   Procedure: MAZE;  Surgeon: Rexene Alberts, MD;  Location: [facility];  Service: Open Heart Surgery;  Laterality: N/A;  . MITRAL VALVE REPAIR Right 03/18/2013   Procedure: MINIMALLY INVASIVE MITRAL VALVE REPAIR (MVR);  Surgeon: Rexene Alberts, MD;  Location: [place];  Service: Open Heart Surgery;  Laterality: Right;  Marland Kitchen VARICOSE VEIN SURGERY    . WISDOM TOOTH EXTRACTION       Current Meds  Medication Sig  . amoxicillin (AMOXIL) 500 MG capsule TAKE 4 CAPSULES BY  MOUTH 1 HOUR PRIOR TO DENTAL APPOINTMENT  . aspirin 81 MG tablet Take 1 tablet (81 mg total) by mouth daily.  Marland Kitchen atorvastatin (LIPITOR) 20 MG tablet TAKE 1 TABLET BY MOUTH DAILY  . Calcium Carbonate-Vitamin D (CALCIUM + D PO) Take 1 capsule by mouth daily.   . Melatonin 10 MG TABS Take by mouth.  . metoprolol succinate (TOPROL-XL) 25 MG 24 hr tablet Take 1 tablet (25 mg total) by mouth in the morning and at bedtime. Take with or immediately following a meal.  . Misc Natural Products (GLUCOSAMINE CHOND COMPLEX/MSM PO) Take 1 tablet by mouth daily.  . Omega-3 Fatty Acids (FISH OIL PO) Take 2 capsules by mouth daily.  . polyethylene glycol (MIRALAX / GLYCOLAX) packet Take 17 g by mouth daily.  . TURMERIC PO Take 1 tablet by mouth daily.   Marland Kitchen zolpidem (AMBIEN) 5 MG tablet TAKE 1/2 TABLET BY MOUTH AT NIGHT AS NEEDED FOR SLEEP  .  [DISCONTINUED] metoprolol succinate (TOPROL-XL) 50 MG 24 hr tablet Take 1 tablet (50 mg total) by mouth daily. Take with or immediately following a meal.  . [DISCONTINUED] metoprolol succinate (TOPROL-XL) 50 MG 24 hr tablet Take 0.5 tablets (25 mg total) by mouth in the morning and at bedtime. Take with or immediately following a meal.     Allergies:   Patient has no known allergies.   Social History   Tobacco Use  . Smoking status: Never Smoker  . Smokeless tobacco: Never Used  Vaping Use  . Vaping Use: Never used  Substance Use Topics  . Alcohol use: Yes    Alcohol/week: 5.0 - 6.0 standard drinks    Types: 5 - 6 Glasses of wine per week    Comment: 1 drink/day  . Drug use: No     Family Hx: The patient's family history includes Colon cancer in her paternal grandmother; Down syndrome in her child; Hypertension in her father; Pancreatic cancer in her mother; Seizures in her child; Stomach cancer in her father and paternal grandfather; Thyroid cancer in her sister.  ROS:   Please see the history of present illness.     All other systems reviewed and are negative.   Prior CV studies:   The following studies were reviewed today:  Event monitor 10/05/2020  Normal sinus rhythm with average heart rate 61 bpm ranged between 45 and 141 bpm  No atrial for  Symptoms do not correlate with arrhythmia.   Labs/Other Tests and Data Reviewed:    EKG:  An ECG dated 09/15/2020 was personally reviewed today and demonstrated:  Sinus rhythm without significant ST-T wave changes  Recent Labs: 09/06/2020: ALT 16; BUN 10; Creatinine, Ser 0.83; Hemoglobin 13.1; Platelets 236.0; Potassium 4.9; Sodium 141; TSH 2.06   Recent Lipid Panel Lab Results  Component Value Date/Time   CHOL 144 09/06/2020 09:33 AM   CHOL 176 08/25/2019 11:34 AM   TRIG 95.0 09/06/2020 09:33 AM   HDL 51.50 09/06/2020 09:33 AM   HDL 66 08/25/2019 11:34 AM   CHOLHDL 3 09/06/2020 09:33 AM   LDLCALC 73 09/06/2020  09:33 AM   LDLCALC 95 08/25/2019 11:34 AM    Wt Readings from Last 3 Encounters:  09/15/20 164 lb 9.6 oz (74.7 kg)  09/06/20 166 lb (75.3 kg)  08/19/20 166 lb (75.3 kg)     Risk Assessment/Calculations:      Objective:    Vital Signs:  There were no vitals taken for this visit.   VITAL SIGNS:  reviewed  ASSESSMENT & PLAN:    1. Dizziness: Recent event monitor did not reveal any culprit behind her dizziness.  She usually feels the dizziness about 40 minutes after she take her morning medication.  We discussed possibly switching her metoprolol succinate to 25 mg twice daily or reduce the metoprolol succinate to 25 mg daily.  She is hesitant to reduce the metoprolol succinate at this time because her history of exercise-induced SVT.  We will proceed to split her metoprolol succinate to 25 mg twice a day for now.  Addendum: Persistent recurrent dizziness may also has a neurological root. I think it would be quite reasonable for her to see her neurologist Dr. Carles Collet early.   2. History of mitral valve repair and annuloplasty: Stable on last echocardiogram  3. PAF s/p Maze procedure: No recurrence of atrial fibrillation.  Not on anticoagulation therapy due to lack of recurrence  4. History of exercise-induced SVT: No recent recurrence  5. Hypertension: Continue on current therapy  6. Hyperlipidemia: On Lipitor   Shared Decision Making/Informed Consent        COVID-19 Education: The signs and symptoms of COVID-19 were discussed with the patient and how to seek care for testing (follow up with PCP or arrange E-visit).  The importance of social distancing was discussed today.  Time:   Today, I have spent 10 minutes with the patient with telehealth technology discussing the above problems.     Medication Adjustments/Labs and Tests Ordered: Current medicines are reviewed at length with the patient today.  Concerns regarding medicines are outlined above.   Tests Ordered: No orders  of the defined types were placed in this encounter.   Medication Changes: Meds ordered this encounter  Medications  . DISCONTD: metoprolol succinate (TOPROL-XL) 50 MG 24 hr tablet    Sig: Take 0.5 tablets (25 mg total) by mouth in the morning and at bedtime. Take with or immediately following a meal.    Dispense:  60 tablet    Refill:  0  . metoprolol succinate (TOPROL-XL) 25 MG 24 hr tablet    Sig: Take 1 tablet (25 mg total) by mouth in the morning and at bedtime. Take with or immediately following a meal.    Dispense:  60 tablet    Refill:  0    Follow Up:  In Person in 3 week(s)  Signed, Almyra Deforest, [place]  10/22/2020 9:00 PM    [place]

## 2020-10-20 NOTE — Telephone Encounter (Signed)
°*  STAT* If patient is at the pharmacy, call can be transferred to refill team.   1. Which medications need to be refilled? (please list name of each medication and dose if known)  Metoprolol Succinate (Toprol-XL) 25 mg 2 times a day  2. Which pharmacy/location (including street and city if local pharmacy) is medication to be sent to? [id number] - [place], [address] AT [address]  3. Do they need a 30 day or 90 day supply?   30 day supply  Patient is requesting that we rewrite the prescription to be two 25 mg tablets as opposed to the 50 mg tablet that she has to cut in half daily. If unable please call patient to advise.  Thank you!

## 2020-10-20 NOTE — Telephone Encounter (Signed)
  Patient Consent for Virtual Visit         Ariel Simpson has provided verbal consent on 10/20/2020 for a virtual visit (video or telephone).   CONSENT FOR VIRTUAL VISIT FOR:  Ariel Simpson  By participating in this virtual visit I agree to the following:  I hereby voluntarily request, consent and authorize [facility] and its employed or contracted physicians, physician assistants, nurse practitioners or other licensed health care professionals (the Practitioner), to provide me with telemedicine health care services (the "Services") as deemed necessary by the treating Practitioner. I acknowledge and consent to receive the Services by the Practitioner via telemedicine. I understand that the telemedicine visit will involve communicating with the Practitioner through live audiovisual communication technology and the disclosure of certain medical information by electronic transmission. I acknowledge that I have been given the opportunity to request an in-person assessment or other available alternative prior to the telemedicine visit and am voluntarily participating in the telemedicine visit.  I understand that I have the right to withhold or withdraw my consent to the use of telemedicine in the course of my care at any time, without affecting my right to future care or treatment, and that the Practitioner or I may terminate the telemedicine visit at any time. I understand that I have the right to inspect all information obtained and/or recorded in the course of the telemedicine visit and may receive copies of available information for a reasonable fee.  I understand that some of the potential risks of receiving the Services via telemedicine include:  Marland Kitchen Delay or interruption in medical evaluation due to technological equipment failure or disruption; . Information transmitted may not be sufficient (e.g. poor resolution of images) to allow for appropriate medical decision making by the  Practitioner; and/or  . In rare instances, security protocols could fail, causing a breach of personal health information.  Furthermore, I acknowledge that it is my responsibility to provide information about my medical history, conditions and care that is complete and accurate to the best of my ability. I acknowledge that Practitioner's advice, recommendations, and/or decision may be based on factors not within their control, such as incomplete or inaccurate data provided by me or distortions of diagnostic images or specimens that may result from electronic transmissions. I understand that the practice of medicine is not an exact science and that Practitioner makes no warranties or guarantees regarding treatment outcomes. I acknowledge that a copy of this consent can be made available to me via my patient portal ([facility]), or I can request a printed copy by calling the office of [facility].    I understand that my insurance will be billed for this visit.   I have read or had this consent read to me. . I understand the contents of this consent, which adequately explains the benefits and risks of the Services being provided via telemedicine.  . I have been provided ample opportunity to ask questions regarding this consent and the Services and have had my questions answered to my satisfaction. . I give my informed consent for the services to be provided through the use of telemedicine in my medical care

## 2020-10-20 NOTE — Patient Instructions (Signed)
Medication Instructions:   TAKE Metoprolol Succinate (Toprol-XL) 25 mg 2 times a day *If you need a refill on your cardiac medications before your next appointment, please call your pharmacy*  Lab Work: NONE ordered at this time of appointment   If you have labs (blood work) drawn today and your tests are completely normal, you will receive your results only by: Marland Kitchen MyChart Message (if you have MyChart) OR . A paper copy in the mail If you have any lab test that is abnormal or we need to change your treatment, we will call you to review the results.  Testing/Procedures: Your physician recommends that you continue on your current medications as directed. Please refer to the Current Medication list given to you today.  Follow-Up: At [facility], you and your health needs are our priority.  As part of our continuing mission to provide you with exceptional heart care, we have created designated Provider Care Teams.  These Care Teams include your primary Cardiologist (physician) and Advanced Practice Providers (APPs -  Physician Assistants and Nurse Practitioners) who all work together to provide you with the care you need, when you need it.  Your next appointment:   2-3 week(s)  The format for your next appointment:   Either Virtual or In Person  Provider:   Almyra Deforest, PA-C  Other Instructions

## 2020-10-21 ENCOUNTER — Other Ambulatory Visit: Payer: Self-pay | Admitting: Gastroenterology

## 2020-10-22 ENCOUNTER — Encounter: Payer: Self-pay | Admitting: Physician Assistant

## 2020-11-01 ENCOUNTER — Telehealth: Payer: Self-pay | Admitting: Primary Care

## 2020-11-01 NOTE — Telephone Encounter (Signed)
Still having urinary urgency, no dysuria.  Has been very constipated lately , and thinks this could be making her feel she has to urinate?  Appt?

## 2020-11-01 NOTE — Telephone Encounter (Signed)
Has she tried anything for her bowels? Would try miralax, one capful daily and if that is not helping, then yes, I should see her

## 2020-11-02 ENCOUNTER — Telehealth: Payer: Self-pay | Admitting: Neurology

## 2020-11-02 NOTE — Telephone Encounter (Signed)
I don't see the patient for this (see her for tremor and haven't seen her in almost a year) so I can't give her advice on the phone.  However, looking at cardiology last phone note of 12/1, I don't see that they said it was neurologically mediated at all but rather said that they thought that her meds were aggravating it.  It also said that they were awaiting an in office visit with them as her last visit was telehealth.

## 2020-11-02 NOTE — Telephone Encounter (Signed)
Patient called in stating she has been having some odd symptoms. The cardiologist thinks she is having "Neurally Mediated Syncope". It seems here heart is fine. She is worried and would like some advice.

## 2020-11-02 NOTE — Telephone Encounter (Signed)
Spoke with patient gave her Dr Doristine Devoid recommendations. The patient states she is going to try to work with her cardiologist to see if they can refer her to our office for the Neurally Mediated Syncope. She states they they told her she has it so she was appalled that it was not in the notes.

## 2020-11-03 NOTE — Telephone Encounter (Signed)
Lm to return my call

## 2020-11-04 ENCOUNTER — Telehealth: Payer: Self-pay | Admitting: Primary Care

## 2020-11-04 ENCOUNTER — Other Ambulatory Visit: Payer: Self-pay | Admitting: Primary Care

## 2020-11-04 ENCOUNTER — Ambulatory Visit: Payer: Medicare Other | Admitting: Physician Assistant

## 2020-11-04 NOTE — Telephone Encounter (Signed)
Says she is feeling better, still with some urinary urgency/frequency, some constipation.  Using miralax daily.  Should she do another urine?  Increase miralax to bid?

## 2020-11-04 NOTE — Telephone Encounter (Signed)
I dont' think another urine is needed now if sx are improving   Yes, if constipation is improving, but not fully resolved on this regimen, she could go up to twice daily on the miralax and see if that helps

## 2020-11-04 NOTE — Telephone Encounter (Signed)
Pt aware will try miralax twice daily.

## 2020-11-10 ENCOUNTER — Other Ambulatory Visit: Payer: Self-pay | Admitting: Physician Assistant

## 2020-11-18 ENCOUNTER — Other Ambulatory Visit: Payer: Self-pay

## 2020-11-18 ENCOUNTER — Encounter: Payer: Self-pay | Admitting: Physician Assistant

## 2020-11-18 ENCOUNTER — Ambulatory Visit: Payer: Medicare Other | Admitting: Physician Assistant

## 2020-11-18 VITALS — BP 126/82 | HR 66 | Ht 67.0 in | Wt 162.2 lb

## 2020-11-18 DIAGNOSIS — R42 Dizziness and giddiness: Secondary | ICD-10-CM | POA: Diagnosis not present

## 2020-11-18 DIAGNOSIS — I480 Paroxysmal atrial fibrillation: Secondary | ICD-10-CM | POA: Diagnosis not present

## 2020-11-18 DIAGNOSIS — I10 Essential (primary) hypertension: Secondary | ICD-10-CM

## 2020-11-18 DIAGNOSIS — E785 Hyperlipidemia, unspecified: Secondary | ICD-10-CM | POA: Diagnosis not present

## 2020-11-18 DIAGNOSIS — Z9889 Other specified postprocedural states: Secondary | ICD-10-CM

## 2020-11-18 DIAGNOSIS — I471 Supraventricular tachycardia: Secondary | ICD-10-CM

## 2020-11-18 NOTE — Progress Notes (Signed)
Cardiology Office Note:    Date:  11/19/2020   ID:  Ariel Simpson, DOB [birth year], MRN [id number]  PCP:  Pincus Sanes, MD  [facility] Cardiologist:  [name], MD  [facility] Electrophysiologist:  None   Referring MD: Pincus Sanes, MD   Chief Complaint  Patient presents with  . Follow-up    Seen for Dr. Katrinka Blazing.     History of Present Illness:    Ariel Simpson is a 64 y.o. female with a hx of mitral valve prolapse and severe MRs/pmitral valve repair/annuloplasty in 2014, PAFs/pMaze procedure at the time of MVR, paroxysmal SVT, hypertension,andhyperlipidemia. Cardiac catheterization prior to mitral valve repair in 2014 showed normal coronary arteries. Echocardiogram performed in 2019 showed EF 55 to 60%, normal wall motion, well-functioning mitral valve repair with only trivial regurgitation, severely dilated RV with moderate TR and elevated PASP consistent with moderate pulmonary hypertension. She split her time between [place] and [place]. She follows Dr. Carlyn Reichert for her cardiac care while she is up [place]. Patient was seen by Marjie Skiff for preoperative clearance in October 2020 at which time she was doing well. She does seem to have some degree of orthostatic dizziness at the time. It was recommended she stay well-hydrated. More recently, she was seen by Dr. Carlyn Reichert for dyspnea on exertion.  Echocardiogram and a 48-hour Holter monitor was recommended. Echocardiogram performed on 05/26/2020 showed EF 66%, stable mitral valve with mean gradient 4 mmHg, mild MR, trace AI, mild to moderate TR, estimated RVSP 37 mmHg. When compared to the previous echocardiogram, there was no significant change. 48-hour Holter monitor demonstrated predominantly sinus bradycardia to sinus rhythm, maximum heart rate 86 bpm, minimal heart rate 43 bpm, average heart rate 53 bpm, there were 2648 PVCs which accounted for 1.67% of total  heartbeat, there were also 1201 PSVTs with a burden of 0.76%. There were 2 patient triggered event correlating with ventricular bigeminy. She was seen by Dr. Cristela Blue 06/01/2020 who recommended continue observation.  I last saw the patient on 08/19/2020 due to episode of low blood pressure and dizziness at home.  Blood pressure obtained in the office however was normal.  Prior to the visit, symptom has recurred about 3 different times.  I recommended a 30-day event monitor, this did not reveal significant culprit to explain her symptoms.  During the last office visit on 10/20/2020, she had less dizziness after stopping the spironolactone.  Her symptoms typically occur within the first 40 minutes after taking her blood pressure medication.  I offered her 2 options, either split the metoprolol succinate to 25 mg twice a day and the second option is to reduce the metoprolol succinate to 25 mg daily and then move the medication to nighttime.  Due to history of exercise-induced SVT, she was hesitant to cut back on metoprolol, therefore she opted to split metoprolol to 25 mg twice daily dosing.  Patient presents today for follow-up.  Since the last visit, she is no longer having dizziness immediately after taking her medication.  On December 13, while preparing dinner standing up, she did have a short episode of dizzy spell that lasted about 30 seconds, this quickly resolved and has not recurred in the past 2 weeks.  I have reviewed her home blood pressure and heart rate.  Systolic blood pressure ranges between 120s to 130s with occasional spike into the 140s range.  Lowest systolic blood pressure was 113.  Heart rate  has been in the 50s to 60s range.  Lowest heart rate 53 bpm.  At this time, I do not recommend any further titration of her medication.  She can follow-up with Dr. Tamala Julian in 4 to 6 months.  She has no heart failure symptoms.  Past Medical History:  Diagnosis Date  . Arthritis   . Benign essential  tremor   . CHF (congestive heart failure) ([place])   . Heart murmur   . High cholesterol   . Hypertension   . Insomnia   . Mitral regurgitation   . Mitral valve prolapse   . Osteopenia   . Paroxysmal atrial fibrillation ([place]) 03/11/2013  . Paroxysmal supraventricular tachycardia ([place])   . PONV (postoperative nausea and vomiting)    Dizziness and Nausea  . S/P Maze operation for atrial fibrillation 03/18/2013   Complete bilateral atrial lesion set using cryothermy and biopolar radiofrequency ablation with oversewing of LA appendage via right mini thoracotomy  . S/P mitral valve repair 03/18/2013   Complex valvuloplasty including triangular resection of posterior leaflet, artificial Gore-tex neocord placement x4 and 80mm Sorin Memo 3D ring annuloplasty via right mini thoracotomy  . Severe mitral regurgitation 03/03/2013   Acute on chronic with acute heart failure   . Shortness of breath   . Wears glasses     Past Surgical History:  Procedure Laterality Date  . ABDOMINAL HYSTERECTOMY     hernia repair  . BIOPSY  08/26/2019   Procedure: BIOPSY;  Surgeon: Wilford Corner, MD;  Location: [facility];  Service: Endoscopy;;  . CARDIAC CATHETERIZATION    . COLONOSCOPY    . COLONOSCOPY WITH PROPOFOL N/A 08/26/2019   Procedure: COLONOSCOPY WITH PROPOFOL;  Surgeon: Wilford Corner, MD;  Location: [facility];  Service: Endoscopy;  Laterality: N/A;  . INTRAOPERATIVE TRANSESOPHAGEAL ECHOCARDIOGRAM N/A 03/18/2013   Procedure: INTRAOPERATIVE TRANSESOPHAGEAL ECHOCARDIOGRAM;  Surgeon: Rexene Alberts, MD;  Location: [place];  Service: Open Heart Surgery;  Laterality: N/A;  . LEFT AND RIGHT HEART CATHETERIZATION WITH CORONARY ANGIOGRAM N/A 03/06/2013   Procedure: LEFT AND RIGHT HEART CATHETERIZATION WITH CORONARY ANGIOGRAM;  Surgeon: Sinclair Grooms, MD;  [facility] CATH LAB;  Service: Cardiovascular;  Laterality: N/A;  . MAZE N/A 03/18/2013   Procedure: MAZE;  Surgeon: Rexene Alberts, MD;  Location:  [place];  Service: Open Heart Surgery;  Laterality: N/A;  . MITRAL VALVE REPAIR Right 03/18/2013   Procedure: MINIMALLY INVASIVE MITRAL VALVE REPAIR (MVR);  Surgeon: Rexene Alberts, MD;  Location: [place];  Service: Open Heart Surgery;  Laterality: Right;  Marland Kitchen VARICOSE VEIN SURGERY    . WISDOM TOOTH EXTRACTION      Current Medications: Current Meds  Medication Sig  . amoxicillin (AMOXIL) 500 MG capsule TAKE 4 CAPSULES BY MOUTH 1 HOUR PRIOR TO DENTAL APPOINTMENT  . aspirin 81 MG tablet Take 1 tablet (81 mg total) by mouth daily.  Marland Kitchen atorvastatin (LIPITOR) 20 MG tablet TAKE 1 TABLET BY MOUTH DAILY  . Calcium Carbonate-Vitamin D (CALCIUM + D PO) Take 1 capsule by mouth daily.   . Melatonin 10 MG TABS Take by mouth.  . metoprolol succinate (TOPROL-XL) 25 MG 24 hr tablet TAKE 1 TABLET(25 MG) BY MOUTH IN THE MORNING AND AT BEDTIME WITH OR IMMEDIATELY FOLLOWING A MEAL  . Misc Natural Products (GLUCOSAMINE CHOND COMPLEX/MSM PO) Take 1 tablet by mouth daily.  . Omega-3 Fatty Acids (FISH OIL PO) Take 2 capsules by mouth daily.  . polyethylene glycol (MIRALAX / GLYCOLAX) packet Take 17  g by mouth daily.  . TURMERIC PO Take 1 tablet by mouth daily.   Marland Kitchen zolpidem (AMBIEN) 5 MG tablet TAKE 1/2 TABLET BY MOUTH AT NIGHT AS NEEDED FOR SLEEP     Allergies:   Patient has no known allergies.   Social History   Socioeconomic History  . Marital status: Married    Spouse name: Peyton Najjar  . Number of children: 3  . Years of education: BS  . Highest education level: Not on file  Occupational History    Employer: [employer]  Tobacco Use  . Smoking status: Never Smoker  . Smokeless tobacco: Never Used  Vaping Use  . Vaping Use: Never used  Substance and Sexual Activity  . Alcohol use: Yes    Alcohol/week: 5.0 - 6.0 standard drinks    Types: 5 - 6 Glasses of wine per week    Comment: 1 drink/day  . Drug use: No  . Sexual activity: Yes    Birth control/protection: Surgical  Other Topics Concern   . Not on file  Social History Narrative   Patient lives at home with her husband  Peyton Najjar)  - second husband. Patient has college education B.S. 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   Right handed.   Caffeine- one cup daily.   Social Determinants of Health   Financial Resource Strain: Low Risk   . Difficulty of Paying Living Expenses: Not hard at all  Food Insecurity: No Food Insecurity  . Worried About Programme researcher, broadcasting/film/video in the Last Year: Never true  . Ran Out of Food in the Last Year: Never true  Transportation Needs: No Transportation Needs  . Lack of Transportation (Medical): No  . Lack of Transportation (Non-Medical): No  Physical Activity: Sufficiently Active  . Days of Exercise per Week: 7 days  . Minutes of Exercise per Session: 60 min  Stress: Stress Concern Present  . Feeling of Stress : Very much  Social Connections: Unknown  . Frequency of Communication with Friends and Family: More than three times a week  . Frequency of Social Gatherings with Friends and Family: Once a week  . Attends Religious Services: Patient refused  . Active Member of Clubs or Organizations: Patient refused  . Attends Banker Meetings: Patient refused  . Marital Status: Married     Family History: The patient's family history includes Colon cancer in her paternal grandmother; Down syndrome in her child; Hypertension in her father; Pancreatic cancer in her mother; Seizures in her child; Stomach cancer in her father and paternal grandfather; Thyroid cancer in her sister.  ROS:   Please see the history of present illness.     All other systems reviewed and are negative.  EKGs/Labs/Other Studies Reviewed:    The following studies were reviewed today:  Event monitor 10/05/2020 Study Highlights   Normal sinus rhythm with average heart rate 61 bpm ranged between 45 and 141 bpm  No atrial for  Symptoms do not correlate with arrhythmia.    EKG:  EKG is not  ordered today.   Recent Labs: 09/06/2020: ALT 16; BUN 10; Creatinine, Ser 0.83; Hemoglobin 13.1; Platelets 236.0; Potassium 4.9; Sodium 141; TSH 2.06  Recent Lipid Panel    Component Value Date/Time   CHOL 144 09/06/2020 0933   CHOL 176 08/25/2019 1134   TRIG 95.0 09/06/2020 0933   HDL 51.50 09/06/2020 0933   HDL 66 08/25/2019 1134   CHOLHDL 3 09/06/2020 0933   VLDL  19.0 09/06/2020 0933   LDLCALC 73 09/06/2020 0933   LDLCALC 95 08/25/2019 1134     Risk Assessment/Calculations:     CHA2DS2-VASc Score = 2  This indicates a 2.2% annual risk of stroke. The patient's score is based upon: CHF History: No HTN History: No Diabetes History: No Stroke History: No Vascular Disease History: No Age Score: 1 Gender Score: 1       Physical Exam:    VS:  BP 126/82   Pulse 66   Ht 5\' 7"  (1.702 m)   Wt 162 lb 3.2 oz (73.6 kg)   SpO2 97%   BMI 25.40 kg/m     Wt Readings from Last 3 Encounters:  11/18/20 162 lb 3.2 oz (73.6 kg)  09/15/20 164 lb 9.6 oz (74.7 kg)  09/06/20 166 lb (75.3 kg)     GEN:  Well nourished, well developed in no acute distress HEENT: Normal NECK: No JVD; No carotid bruits LYMPHATICS: No lymphadenopathy CARDIAC: RRR, no murmurs, rubs, gallops RESPIRATORY:  Clear to auscultation without rales, wheezing or rhonchi  ABDOMEN: Soft, non-tender, non-distended MUSCULOSKELETAL:  No edema; No deformity  SKIN: Warm and dry NEUROLOGIC:  Alert and oriented x 3 PSYCHIATRIC:  Normal affect   ASSESSMENT:    1. Dizziness   2. S/P mitral valve repair   3. PAF (paroxysmal atrial fibrillation) ([place])   4. Essential hypertension   5. Hyperlipidemia LDL goal <100   6. SVT (supraventricular tachycardia) ([facility])    PLAN:    In order of problems listed above:  1. Dizziness: Symptom has significantly improved after her 50 mg Toprol-XL was split into 25 mg twice daily dosing.  She only had one episode of dizziness since the last visit.  2. History of mitral valve  repair: Stable on last echocardiogram  3. PAF: History of Maze procedure.  No recent recurrence.  Not on anticoagulation therapy due to lack of recurrence.  4. Hypertension: Blood pressure stable  5. Hyperlipidemia: On Lipitor  6. History of SVT: Exercise induced, previous episode occurred on treadmill. Well-controlled Toprol-XL        Medication Adjustments/Labs and Tests Ordered: Current medicines are reviewed at length with the patient today.  Concerns regarding medicines are outlined above.  No orders of the defined types were placed in this encounter.  No orders of the defined types were placed in this encounter.   Patient Instructions  Medication Instructions:  Your physician recommends that you continue on your current medications as directed. Please refer to the Current Medication list given to you today.  *If you need a refill on your cardiac medications before your next appointment, please call your pharmacy*  Lab Work: NONE ordered at this time of appointment   If you have labs (blood work) drawn today and your tests are completely normal, you will receive your results only by: Marland Kitchen MyChart Message (if you have MyChart) OR . A paper copy in the mail If you have any lab test that is abnormal or we need to change your treatment, we will call you to review the results.  Testing/Procedures: NONE ordered at this time of appointment   Follow-Up: At [facility], you and your health needs are our priority.  As part of our continuing mission to provide you with exceptional heart care, we have created designated Provider Care Teams.  These Care Teams include your primary Cardiologist (physician) and Advanced Practice Providers (APPs -  Physician Assistants and Nurse Practitioners) who all work together to provide  you with the care you need, when you need it.  Your next appointment:   4 month(s)  The format for your next appointment:   In Person  Provider:   Daneen Schick, MD  Other Instructions     Signed, Almyra Deforest, [place]  11/19/2020 7:16 PM    [place]

## 2020-11-18 NOTE — Telephone Encounter (Signed)
Reviewed during office visit

## 2020-11-18 NOTE — Patient Instructions (Signed)
Medication Instructions:  Your physician recommends that you continue on your current medications as directed. Please refer to the Current Medication list given to you today.  *If you need a refill on your cardiac medications before your next appointment, please call your pharmacy*  Lab Work: NONE ordered at this time of appointment   If you have labs (blood work) drawn today and your tests are completely normal, you will receive your results only by: Marland Kitchen MyChart Message (if you have MyChart) OR . A paper copy in the mail If you have any lab test that is abnormal or we need to change your treatment, we will call you to review the results.  Testing/Procedures: NONE ordered at this time of appointment   Follow-Up: At [facility], you and your health needs are our priority.  As part of our continuing mission to provide you with exceptional heart care, we have created designated Provider Care Teams.  These Care Teams include your primary Cardiologist (physician) and Advanced Practice Providers (APPs -  Physician Assistants and Nurse Practitioners) who all work together to provide you with the care you need, when you need it.  Your next appointment:   4 month(s)  The format for your next appointment:   In Person  Provider:   Verdis Prime, MD  Other Instructions

## 2020-11-19 ENCOUNTER — Encounter: Payer: Self-pay | Admitting: Physician Assistant

## 2020-11-22 ENCOUNTER — Ambulatory Visit: Payer: Medicare Other | Admitting: Primary Care

## 2020-11-22 ENCOUNTER — Ambulatory Visit: Payer: Medicare Other | Attending: Primary Care | Admitting: Primary Care

## 2020-11-22 ENCOUNTER — Encounter: Payer: Self-pay | Admitting: Primary Care

## 2020-11-22 DIAGNOSIS — H903 Sensorineural hearing loss, bilateral: Secondary | ICD-10-CM

## 2020-11-22 DIAGNOSIS — K589 Irritable bowel syndrome without diarrhea: Secondary | ICD-10-CM | POA: Insufficient documentation

## 2020-11-22 DIAGNOSIS — R634 Abnormal weight loss: Secondary | ICD-10-CM | POA: Insufficient documentation

## 2020-11-22 DIAGNOSIS — K219 Gastro-esophageal reflux disease without esophagitis: Secondary | ICD-10-CM | POA: Insufficient documentation

## 2020-11-22 MED ORDER — CICLOPIROX 8 % EX SOLN *A*
Freq: Every evening | CUTANEOUS | 3 refills | Status: DC
Start: 2020-11-22 — End: 2022-01-25

## 2020-11-22 NOTE — Progress Notes (Signed)
Reason for Visit: Gastrophageal Reflux    [name] presents today for video visit for follow up on GERD, abdominal pain    She is feeling much better with the increase in miralax to bid, less bloated, abdominal pain has improved  Sometimes does less than a full second dose, as it can be too much at times    The GERD is also less symptomatic with the improvement in her constipation  She is limiting her diet and  following anti reflux measures  Her weight is now 106  Does feels full a little earlier; denies dysphagia or emesis  Meals are small.  She does have one snack typically of 8 crackers in the middle of the day  Will try adding peanut butter    She has noted a little bit of dark black discoloration and thickening of the great toenail.    When she was on the Diflucan for vaginal yeast infection it did help the toenail but was not enough  tried topical econazole which was messy  We discussed a trial of ciclopirox    She got hearing aids years ago and developed a sensate headache; she did try wearing them again and developed the same headache.  She saw an audiologist for this, who told her it was age-related and did not have any suggestions for how to make the hearing aids more tolerable.  She does note more difficulty hearing her husband speak and eventually would like to resume hearing aid use.  In light of the pandemic, she would like to hold off on an acute referral for right now but I will plan to send her to Dr. 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    Ringing In The Ears (Tinnitus) H93.19    GERD (gastroesophageal reflux disease) K21.9    Pain in joint, ankle and foot M25.579    Weakness of foot R29.898    Hyperlipidemia E78.5     Current Outpatient Medications   Medication    carBAMazepine (TEGRETOL XR) 200 mg 12 hr tablet    azelastine (ASTEPRO) 137 MCG/SPRAY SOLN nasal solution    carBAMazepine (CARBATROL) 100 mg 12 hr capsule    fluconazole (DIFLUCAN) 150 mg tablet    amLODIPine (NORVASC) 2.5 mg tablet    cyclobenzaprine (FLEXERIL) 5 MG tablet    pseudoephedrine (SUDAFED) 30 MG tablet    levothyroxine (SYNTHROID, LEVOTHROID) 25 MCG tablet    doxycycline hyclate (VIBRA-TABS) 100 MG tablet    triamcinolone (KENALOG) 0.1 % ointment    pseudoephedrine ([employer] 12 HOUR NASAL DECONGESTANT) 120 MG 12 hr tablet    solifenacin (VESICARE) 10 MG tablet    RABEprazole (ACIPHEX) 20 MG tablet    atorvastatin (LIPITOR) 10 MG tablet    EPINEPHrine (EPIPEN) 0.3 mg/0.3 mL auto-injector    beclomethasone (QNASL) 80 MCG/ACT  nasal spray    famotidine (PEPCID) 20 MG tablet    fluticasone (FLOVENT DISKUS) 250 MCG/BLIST diskus inhaler    generic DME    Non-System Medication    Calcium Carb-Cholecalciferol 600-200 MG-UNIT TABS    Denosumab (PROLIA SC)    loratadine (CLARITIN) 10 MG tablet    Multiple Vitamin (MULTIVITAMIN) per tablet    cholecalciferol (VITAMIN D) 1000 UNITS capsule     No current facility-administered medications for this visit.       Medication list reviewed and updated, no changes were made today    Exam: There were no vitals taken for this visit.  Visit was conducted via doxy.  Well-appearing 65 year old lady in no acute distress    A/P:  1.  Irritable bowel syndrome with constipation-has responded well to twice daily MiraLAX.  She will continue to modify the dose as needed to keep her bowels moving regularly.  2.  Gastroesophageal  reflux disease-continue antireflux measures and aciphex 20mg   3.  Weight loss-all the restrictions on her diet are likely playing a role.  Discussed ways to boost calories such as adding peanut butter to her crackers during her afternoon snack.  If need be, can brainstorm other ideas to add additional nutrition at mealtime  4.  Onychomycosis-I can certainly examine this further when I see her back in the office in person.  However in the meantime, the most benign treatment would be ciclopirox to be applied once daily for 7 days and then wiped off with alcohol.  Discussed filing the nail down to keep it short  5.  Hearing loss--will refer to Dr. Marylou Mccoy when she is ready to start exploring hearing aid use again    52 min personally spent on the date of the encounter including face to face pre and post visit work

## 2020-12-30 ENCOUNTER — Other Ambulatory Visit: Payer: Self-pay | Admitting: Primary Care

## 2021-01-06 ENCOUNTER — Other Ambulatory Visit: Payer: Self-pay | Admitting: Primary Care

## 2021-01-06 DIAGNOSIS — E785 Hyperlipidemia, unspecified: Secondary | ICD-10-CM

## 2021-01-12 ENCOUNTER — Encounter: Payer: Self-pay | Admitting: Internal Medicine

## 2021-01-12 NOTE — Progress Notes (Signed)
Subjective:    Patient ID: Ariel Simpson, female    DOB: [birth year], 65 y.o.   MRN: [id number]  HPI The patient is here for an acute visit.  Recent syncopal event --  Last week she was at home turned lifted up the shade to get more light in the room, turned back quickly and got very dizzy and then collapsed.  The event was so fast, but she did recall the dizzy feeling - she denied any other symptoms at that time.  She had LOC very briefly.  She felt totally fine once she came to and the rest of the day. No similar symptoms since.   She does yoga daily with postural changes and has not had any issues.   She does not think she ever moves that quickly.    Saw cardio 12/21 - - has had dizziness in past - metoprolol for PSVT.  She had wore the monitor for 30 days - nothing too concerning.  She has not had that issue recently.    BP average at home 136/86   HR  51-62  Has occipital migraines.     Medications and allergies reviewed with patient and updated if appropriate.  Patient Active Problem List   Diagnosis Date Noted  . Hypotension 09/06/2020  . Personal history of colonic polyps 08/26/2019  . Osteopenia 09/23/2016  . Need for prophylactic antibiotic 09/23/2016  . Tremor, benign 08/08/2016  . Hyperlipidemia 08/08/2016  . Insomnia 08/08/2016  . S/P mitral valve repair 03/18/2013  . S/P Maze operation for atrial fibrillation 03/18/2013  . Paroxysmal atrial fibrillation ([place]) 03/11/2013  . History of PSVT (paroxysmal supraventricular tachycardia) 03/03/2013    Current Outpatient Medications on File Prior to Visit  Medication Sig Dispense Refill  . amoxicillin (AMOXIL) 500 MG capsule TAKE 4 CAPSULES BY MOUTH 1 HOUR PRIOR TO DENTAL APPOINTMENT 4 capsule 3  . aspirin 81 MG tablet Take 1 tablet (81 mg total) by mouth daily.    Marland Kitchen atorvastatin (LIPITOR) 20 MG tablet TAKE 1 TABLET BY MOUTH DAILY 90 tablet 1  . Calcium Carbonate-Vitamin D (CALCIUM + D PO) Take 1 capsule by mouth  daily.     . Melatonin 10 MG TABS Take by mouth.    . metoprolol succinate (TOPROL-XL) 25 MG 24 hr tablet TAKE 1 TABLET(25 MG) BY MOUTH IN THE MORNING AND AT BEDTIME WITH OR IMMEDIATELY FOLLOWING A MEAL 90 tablet 3  . Misc Natural Products (GLUCOSAMINE CHOND COMPLEX/MSM PO) Take 1 tablet by mouth daily.    . Omega-3 Fatty Acids (FISH OIL PO) Take 2 capsules by mouth daily.    . polyethylene glycol (MIRALAX / GLYCOLAX) packet Take 17 g by mouth daily.    . TURMERIC PO Take 1 tablet by mouth daily.     Marland Kitchen zolpidem (AMBIEN) 5 MG tablet TAKE 1/2 TABLET BY MOUTH AT NIGHT AS NEEDED FOR SLEEP 30 tablet 0   No current facility-administered medications on file prior to visit.    Past Medical History:  Diagnosis Date  . Arthritis   . Benign essential tremor   . CHF (congestive heart failure) ([place])   . Heart murmur   . High cholesterol   . Hypertension   . Insomnia   . Mitral regurgitation   . Mitral valve prolapse   . Osteopenia   . Paroxysmal atrial fibrillation ([place]) 03/11/2013  . Paroxysmal supraventricular tachycardia ([place])   . PONV (postoperative nausea and vomiting)    Dizziness and Nausea  .  S/P Maze operation for atrial fibrillation 03/18/2013   Complete bilateral atrial lesion set using cryothermy and biopolar radiofrequency ablation with oversewing of LA appendage via right mini thoracotomy  . S/P mitral valve repair 03/18/2013   Complex valvuloplasty including triangular resection of posterior leaflet, artificial Gore-tex neocord placement x4 and 59mm Sorin Memo 3D ring annuloplasty via right mini thoracotomy  . Severe mitral regurgitation 03/03/2013   Acute on chronic with acute heart failure   . Shortness of breath   . Wears glasses     Past Surgical History:  Procedure Laterality Date  . ABDOMINAL HYSTERECTOMY     hernia repair  . BIOPSY  08/26/2019   Procedure: BIOPSY;  Surgeon: Wilford Corner, MD;  Location: [facility];  Service: Endoscopy;;  . CARDIAC CATHETERIZATION     . COLONOSCOPY    . COLONOSCOPY WITH PROPOFOL N/A 08/26/2019   Procedure: COLONOSCOPY WITH PROPOFOL;  Surgeon: Wilford Corner, MD;  [facility];  Service: Endoscopy;  Laterality: N/A;  . INTRAOPERATIVE TRANSESOPHAGEAL ECHOCARDIOGRAM N/A 03/18/2013   Procedure: INTRAOPERATIVE TRANSESOPHAGEAL ECHOCARDIOGRAM;  Surgeon: Rexene Alberts, MD;  Location: [place];  Service: Open Heart Surgery;  Laterality: N/A;  . LEFT AND RIGHT HEART CATHETERIZATION WITH CORONARY ANGIOGRAM N/A 03/06/2013   Procedure: LEFT AND RIGHT HEART CATHETERIZATION WITH CORONARY ANGIOGRAM;  Surgeon: Sinclair Grooms, MD;  [facility] LAB;  Service: Cardiovascular;  Laterality: N/A;  . MAZE N/A 03/18/2013   Procedure: MAZE;  Surgeon: Rexene Alberts, MD;  Location: [place];  Service: Open Heart Surgery;  Laterality: N/A;  . MITRAL VALVE REPAIR Right 03/18/2013   Procedure: MINIMALLY INVASIVE MITRAL VALVE REPAIR (MVR);  Surgeon: Rexene Alberts, MD;  Location: [place];  Service: Open Heart Surgery;  Laterality: Right;  Marland Kitchen VARICOSE VEIN SURGERY    . WISDOM TOOTH EXTRACTION      Social History   Socioeconomic History  . Marital status: Married    Spouse name: Fritz Pickerel  . Number of children: 3  . Years of education: BS  . Highest education level: Not on file  Occupational History    Employer: [employer]  Tobacco Use  . Smoking status: Never Smoker  . Smokeless tobacco: Never Used  Vaping Use  . Vaping Use: Never used  Substance and Sexual Activity  . Alcohol use: Yes    Alcohol/week: 5.0 - 6.0 standard drinks    Types: 5 - 6 Glasses of wine per week    Comment: 1 drink/day  . Drug use: No  . Sexual activity: Yes    Birth control/protection: Surgical  Other Topics Concern  . Not on file  Social History Narrative   Patient lives at home with her husband  Fritz Pickerel)  - second husband. Patient has college education B.S. 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   Caffeine- one  cup daily.   Social Determinants of Health   Financial Resource Strain: Low Risk   . Difficulty of Paying Living Expenses: Not hard at all  Food Insecurity: No Food Insecurity  . Worried About Charity fundraiser in the Last Year: Never true  . Ran Out of Food in the Last Year: Never true  Transportation Needs: No Transportation Needs  . Lack of Transportation (Medical): No  . Lack of Transportation (Non-Medical): No  Physical Activity: Sufficiently Active  . Days of Exercise per Week: 7 days  . Minutes of Exercise per Session: 60 min  Stress:  Stress Concern Present  . Feeling of Stress : Very much  Social Connections: Unknown  . Frequency of Communication with Friends and Family: More than three times a week  . Frequency of Social Gatherings with Friends and Family: Once a week  . Attends Religious Services: Patient refused  . Active Member of Clubs or Organizations: Patient refused  . Attends Archivist Meetings: Patient refused  . Marital Status: Married    Family History  Problem Relation Age of Onset  . Pancreatic cancer Mother   . Stomach cancer Father   . Hypertension Father   . Thyroid cancer Sister   . Colon cancer Paternal Grandmother   . Stomach cancer Paternal Grandfather   . Seizures Child   . Down syndrome Child     Review of Systems  Constitutional: Negative for fever.  Respiratory: Negative for shortness of breath.   Cardiovascular: Negative for chest pain, palpitations and leg swelling.  Neurological: Negative for dizziness (none besides that episode), weakness, light-headedness, numbness and headaches.       Objective:   Vitals:   01/13/21 1052  BP: 130/78  Pulse: 71  Temp: 98.4 F (36.9 C)  SpO2: 97%   BP Readings from Last 3 Encounters:  01/13/21 130/78  11/18/20 126/82  09/15/20 (!) 146/90   Wt Readings from Last 3 Encounters:  01/13/21 161 lb (73 kg)  11/18/20 162 lb 3.2 oz (73.6 kg)  09/15/20 164 lb 9.6 oz (74.7 kg)    Body mass index is 25.22 kg/m.   Physical Exam Constitutional:      General: She is not in acute distress.    Appearance: Normal appearance. She is not ill-appearing.  HENT:     Head: Normocephalic and atraumatic.  Eyes:     Extraocular Movements: Extraocular movements intact.     Conjunctiva/sclera: Conjunctivae normal.  Cardiovascular:     Rate and Rhythm: Normal rate and regular rhythm.     Heart sounds: Murmur (2/6 systolic) heard.    Pulmonary:     Effort: Pulmonary effort is normal.     Breath sounds: Normal breath sounds.  Musculoskeletal:     Cervical back: Neck supple. No tenderness.     Right lower leg: No edema.     Left lower leg: No edema.  Lymphadenopathy:     Cervical: No cervical adenopathy.  Skin:    General: Skin is warm and dry.  Neurological:     General: No focal deficit present.     Mental Status: She is alert and oriented to person, place, and time.     Sensory: No sensory deficit.     Motor: No weakness.     Gait: Gait normal.            Assessment & Plan:    See Problem List for Assessment and Plan of chronic medical problems.    This visit occurred during the SARS-CoV-2 public health emergency.  Safety protocols were in place, including screening questions prior to the visit, additional usage of staff PPE, and extensive cleaning of exam room while observing appropriate contact time as indicated for disinfecting solutions.

## 2021-01-13 ENCOUNTER — Ambulatory Visit (INDEPENDENT_AMBULATORY_CARE_PROVIDER_SITE_OTHER): Payer: Medicare Other | Admitting: Internal Medicine

## 2021-01-13 ENCOUNTER — Encounter: Payer: Self-pay | Admitting: Internal Medicine

## 2021-01-13 ENCOUNTER — Other Ambulatory Visit: Payer: Self-pay

## 2021-01-13 DIAGNOSIS — R55 Syncope and collapse: Secondary | ICD-10-CM | POA: Diagnosis not present

## 2021-01-13 DIAGNOSIS — R42 Dizziness and giddiness: Secondary | ICD-10-CM

## 2021-01-13 NOTE — Assessment & Plan Note (Signed)
Acute Episode of dizziness that occurred after a quick turning movement that resulted in syncope No injuries, felt fine immediately after No cp, palps, sob BP well controlled at home, not too low Has had PSVT and sees cardio - has worn 30 day monitor w/o concerning events Syncope less likely cardiac - possibly related to vertigo She is very anxious about this and we will evaluate further  MR brain ordered Refer to neuro for their opintion Has cardio appt in the next couple of months

## 2021-01-13 NOTE — Patient Instructions (Signed)
  An MRI was ordered - they will call you to schedule this.      Medications changes include :  none   A referral was ordered for [place] neurology.     Someone from their office will call you to schedule an appointment.

## 2021-01-17 ENCOUNTER — Encounter: Payer: Self-pay | Admitting: Internal Medicine

## 2021-01-21 ENCOUNTER — Telehealth: Payer: Self-pay | Admitting: Neurology

## 2021-01-21 NOTE — Telephone Encounter (Signed)
Lmom for patient to call back to the office

## 2021-01-21 NOTE — Telephone Encounter (Signed)
Let pt know that I had sent message to cardiology PA and he never responded but her cardiologist, Dr. Tamala Julian, responded immediately when I sent him the message about her syncope (which looks vasovagal).  Pt had said that cardiology PA said to come here but I didn't see that in the notes.  Cardiologist agreed that she needed further cardiology work up and is going to have EP specialist see her regarding the syncope.  I worry that seeing me is going to be a waste of her time as she needs further cardiac work up

## 2021-01-21 NOTE — Telephone Encounter (Signed)
Left detailed message with results, ok per DPR, and to call back if any questions.

## 2021-01-23 ENCOUNTER — Encounter: Payer: Self-pay | Admitting: Internal Medicine

## 2021-01-24 ENCOUNTER — Other Ambulatory Visit: Payer: Self-pay | Admitting: *Deleted

## 2021-01-24 DIAGNOSIS — R55 Syncope and collapse: Secondary | ICD-10-CM

## 2021-01-28 ENCOUNTER — Encounter: Payer: Self-pay | Admitting: Gastroenterology

## 2021-01-28 ENCOUNTER — Ambulatory Visit: Payer: Medicare Other | Admitting: Neurology

## 2021-01-31 ENCOUNTER — Encounter: Payer: Self-pay | Admitting: Gastroenterology

## 2021-02-01 ENCOUNTER — Ambulatory Visit: Payer: Medicare Other | Admitting: Neurology

## 2021-02-06 ENCOUNTER — Ambulatory Visit
Admission: RE | Admit: 2021-02-06 | Discharge: 2021-02-06 | Disposition: A | Discharge status: Home / Self Care | Payer: Medicare Other | Source: Ambulatory Visit | Attending: Internal Medicine | Admitting: Internal Medicine

## 2021-02-06 ENCOUNTER — Other Ambulatory Visit: Payer: Self-pay

## 2021-02-06 DIAGNOSIS — S06360A Traumatic hemorrhage of cerebrum, unspecified, without loss of consciousness, initial encounter: Secondary | ICD-10-CM | POA: Diagnosis not present

## 2021-02-06 DIAGNOSIS — R42 Dizziness and giddiness: Secondary | ICD-10-CM | POA: Diagnosis not present

## 2021-02-06 DIAGNOSIS — R55 Syncope and collapse: Secondary | ICD-10-CM | POA: Diagnosis not present

## 2021-02-07 ENCOUNTER — Encounter: Payer: Self-pay | Admitting: Internal Medicine

## 2021-02-08 ENCOUNTER — Encounter: Payer: Self-pay | Admitting: Internal Medicine

## 2021-02-17 ENCOUNTER — Ambulatory Visit: Payer: Medicare Other | Admitting: Neurology

## 2021-02-17 DIAGNOSIS — L738 Other specified follicular disorders: Secondary | ICD-10-CM | POA: Diagnosis not present

## 2021-02-17 DIAGNOSIS — D225 Melanocytic nevi of trunk: Secondary | ICD-10-CM | POA: Diagnosis not present

## 2021-02-17 DIAGNOSIS — L814 Other melanin hyperpigmentation: Secondary | ICD-10-CM | POA: Diagnosis not present

## 2021-02-17 DIAGNOSIS — L821 Other seborrheic keratosis: Secondary | ICD-10-CM | POA: Diagnosis not present

## 2021-02-17 DIAGNOSIS — L819 Disorder of pigmentation, unspecified: Secondary | ICD-10-CM | POA: Diagnosis not present

## 2021-02-17 DIAGNOSIS — L720 Epidermal cyst: Secondary | ICD-10-CM | POA: Diagnosis not present

## 2021-02-18 LAB — HM MAMMOGRAPHY

## 2021-02-21 ENCOUNTER — Ambulatory Visit: Payer: Medicare Other | Admitting: Primary Care

## 2021-02-22 ENCOUNTER — Encounter: Payer: Self-pay | Admitting: Internal Medicine

## 2021-02-22 ENCOUNTER — Other Ambulatory Visit: Payer: Self-pay

## 2021-02-22 ENCOUNTER — Ambulatory Visit: Payer: Medicare Other | Admitting: Internal Medicine

## 2021-02-22 ENCOUNTER — Other Ambulatory Visit: Payer: Self-pay | Admitting: Student

## 2021-02-22 VITALS — BP 132/72 | HR 55 | Ht 67.0 in | Wt 163.0 lb

## 2021-02-22 DIAGNOSIS — R42 Dizziness and giddiness: Secondary | ICD-10-CM

## 2021-02-22 DIAGNOSIS — I480 Paroxysmal atrial fibrillation: Secondary | ICD-10-CM | POA: Diagnosis not present

## 2021-02-22 NOTE — Patient Instructions (Addendum)
Medication Instructions:  Your physician recommends that you continue on your current medications as directed. Please refer to the Current Medication list given to you today.  Labwork: None ordered.  Testing/Procedures: None ordered.  Follow-Up:  Please let me know if you would like to proceed with loop implant.  Sonia Baller RN  Any Other Special Instructions Will Be Listed Below (If Applicable).  If you need a refill on your cardiac medications before your next appointment, please call your pharmacy.    Implantable Loop Recorder Placement  An implantable loop recorder is a small electronic device that is placed under the skin of your chest. The device records the electrical activity of your heart over a long period of time. Your health care provider can download these recordings to monitor your heart. You may need an implantable loop recorder if you have periods of abnormal heart activity (arrhythmias) or unexplained fainting (syncope). The recorder can be left in place for 1 year or longer.

## 2021-02-22 NOTE — Progress Notes (Signed)
HPI Mrs. Ariel Simpson is referred by Dr. Tamala Julian for evaluation of dizziness. She is a pleasant 65 yo woman with a h/o HTN, mitral valve repair, dyslipidemia, SVT, and spells of near syncope. She describes in great detail her 4 episodes in the last 8 months where she "felt weird", got dizzy and nauseated and clammy. Her BP was documented to be as low as 60. She did not lose consciousness. She gives a h/o vagal spells in her 27's. She had her toprol reduced. Her diuretic was held. 2 months ago she passed out suddenly and without warning. This time the spell was not associated with any vagal type of symptoms. She was out for only a few seconds and on awakening did not have any other symptoms. No loss of continence of tongue biting.  No Known Allergies   Current Outpatient Medications  Medication Sig Dispense Refill  . amoxicillin (AMOXIL) 500 MG capsule TAKE 4 CAPSULES BY MOUTH 1 HOUR PRIOR TO DENTAL APPOINTMENT 4 capsule 3  . aspirin 81 MG tablet Take 1 tablet (81 mg total) by mouth daily.    Marland Kitchen atorvastatin (LIPITOR) 20 MG tablet TAKE 1 TABLET BY MOUTH DAILY 90 tablet 1  . Calcium Carbonate-Vitamin D (CALCIUM + D PO) Take 1 capsule by mouth daily.     . Melatonin 10 MG TABS Take by mouth.    . metoprolol succinate (TOPROL-XL) 25 MG 24 hr tablet TAKE 1 TABLET(25 MG) BY MOUTH IN THE MORNING AND AT BEDTIME WITH OR IMMEDIATELY FOLLOWING A MEAL 90 tablet 3  . Misc Natural Products (GLUCOSAMINE CHOND COMPLEX/MSM PO) Take 1 tablet by mouth daily.    . Omega-3 Fatty Acids (FISH OIL PO) Take 2 capsules by mouth daily.    . polyethylene glycol (MIRALAX / GLYCOLAX) packet Take 17 g by mouth daily.    . TURMERIC PO Take 1 tablet by mouth daily.     Marland Kitchen zolpidem (AMBIEN) 5 MG tablet TAKE 1/2 TABLET BY MOUTH AT NIGHT AS NEEDED FOR SLEEP 30 tablet 0   No current facility-administered medications for this visit.     Past Medical History:  Diagnosis Date  . Arthritis   . Benign essential tremor   . CHF  (congestive heart failure) ([place])   . Heart murmur   . High cholesterol   . Hypertension   . Insomnia   . Mitral regurgitation   . Mitral valve prolapse   . Osteopenia   . Paroxysmal atrial fibrillation ([place]) 03/11/2013  . Paroxysmal supraventricular tachycardia ([place])   . PONV (postoperative nausea and vomiting)    Dizziness and Nausea  . S/P Maze operation for atrial fibrillation 03/18/2013   Complete bilateral atrial lesion set using cryothermy and biopolar radiofrequency ablation with oversewing of LA appendage via right mini thoracotomy  . S/P mitral valve repair 03/18/2013   Complex valvuloplasty including triangular resection of posterior leaflet, artificial Gore-tex neocord placement x4 and 42mm Sorin Memo 3D ring annuloplasty via right mini thoracotomy  . Severe mitral regurgitation 03/03/2013   Acute on chronic with acute heart failure   . Shortness of breath   . Wears glasses     ROS:   All systems reviewed and negative except as noted in the HPI.   Past Surgical History:  Procedure Laterality Date  . ABDOMINAL HYSTERECTOMY     hernia repair  . BIOPSY  08/26/2019   Procedure: BIOPSY;  Surgeon: Wilford Corner, MD;  [facility];  Service: Endoscopy;;  . CARDIAC  CATHETERIZATION    . COLONOSCOPY    . COLONOSCOPY WITH PROPOFOL N/A 08/26/2019   Procedure: COLONOSCOPY WITH PROPOFOL;  Surgeon: Wilford Corner, MD;  [facility];  Service: Endoscopy;  Laterality: N/A;  . INTRAOPERATIVE TRANSESOPHAGEAL ECHOCARDIOGRAM N/A 03/18/2013   Procedure: INTRAOPERATIVE TRANSESOPHAGEAL ECHOCARDIOGRAM;  Surgeon: Rexene Alberts, MD;  Location: [place];  Service: Open Heart Surgery;  Laterality: N/A;  . LEFT AND RIGHT HEART CATHETERIZATION WITH CORONARY ANGIOGRAM N/A 03/06/2013   Procedure: LEFT AND RIGHT HEART CATHETERIZATION WITH CORONARY ANGIOGRAM;  Surgeon: Sinclair Grooms, MD;  Location: [facility] CATH LAB;  Service: Cardiovascular;  Laterality: N/A;  . MAZE N/A 03/18/2013    Procedure: MAZE;  Surgeon: Rexene Alberts, MD;  Location: [place];  Service: Open Heart Surgery;  Laterality: N/A;  . MITRAL VALVE REPAIR Right 03/18/2013   Procedure: MINIMALLY INVASIVE MITRAL VALVE REPAIR (MVR);  Surgeon: Rexene Alberts, MD;  Location: [place];  Service: Open Heart Surgery;  Laterality: Right;  Marland Kitchen VARICOSE VEIN SURGERY    . WISDOM TOOTH EXTRACTION       Family History  Problem Relation Age of Onset  . Pancreatic cancer Mother   . Stomach cancer Father   . Hypertension Father   . Thyroid cancer Sister   . Colon cancer Paternal Grandmother   . Stomach cancer Paternal Grandfather   . Seizures Child   . Down syndrome Child      Social History   Socioeconomic History  . Marital status: Married    Spouse name: Fritz Pickerel  . Number of children: 3  . Years of education: BS  . Highest education level: Not on file  Occupational History    Employer: [employer]  Tobacco Use  . Smoking status: Never Smoker  . Smokeless tobacco: Never Used  Vaping Use  . Vaping Use: Never used  Substance and Sexual Activity  . Alcohol use: Yes    Alcohol/week: 5.0 - 6.0 standard drinks    Types: 5 - 6 Glasses of wine per week    Comment: 1 drink/day  . Drug use: No  . Sexual activity: Yes    Birth control/protection: Surgical  Other Topics Concern  . Not on file  Social History Narrative   Patient lives at home with her husband  Fritz Pickerel)  - second husband. Patient has college education B.S. 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   Caffeine- one cup daily.   Social Determinants of Health   Financial Resource Strain: Low Risk   . Difficulty of Paying Living Expenses: Not hard at all  Food Insecurity: No Food Insecurity  . Worried About Charity fundraiser in the Last Year: Never true  . Ran Out of Food in the Last Year: Never true  Transportation Needs: No Transportation Needs  . Lack of Transportation (Medical): No  . Lack of  Transportation (Non-Medical): No  Physical Activity: Sufficiently Active  . Days of Exercise per Week: 7 days  . Minutes of Exercise per Session: 60 min  Stress: Stress Concern Present  . Feeling of Stress : Very much  Social Connections: Unknown  . Frequency of Communication with Friends and Family: More than three times a week  . Frequency of Social Gatherings with Friends and Family: Once a week  . Attends Religious Services: Patient refused  . Active Member of Clubs or Organizations: Patient refused  . Attends Archivist Meetings: Patient refused  . Marital  Status: Married  Human resources officer Violence: Not on file     BP 132/72   Pulse (!) 55   Ht 5\' 7"  (1.702 m)   Wt 163 lb (73.9 kg)   SpO2 97%   BMI 25.53 kg/m   Physical Exam:  Well appearing NAD HEENT: Unremarkable Neck:  No JVD, no thyromegally Lymphatics:  No adenopathy Back:  No CVA tenderness Lungs:  Clear with no wheezes HEART:  Regular rate rhythm, no murmurs, no rubs, no clicks Abd:  soft, positive bowel sounds, no organomegally, no rebound, no guarding Ext:  2 plus pulses, no edema, no cyanosis, no clubbing Skin:  No rashes no nodules Neuro:  CN II through XII intact, motor grossly intact  EKG sinus bradycardia   Assess/Plan: 1. Syncope - She has unexplained syncope. I offered her ILR insertion. She is reflecting. 2. Autonomic dysfunction - she has clear episodes which do not result in syncope. She is controlling these by laying down.  3. Mitral valve repair/MAZE - she appears to be doing well with this. One of the complications of MAZE is sinus node dysfunction. I am concerned that sinus arrest is causing her frank syncope. She wore a 30 day monitor which did not show any long pauses. I have recommended an ILR.  Carleene Overlie [employer],MD

## 2021-02-23 ENCOUNTER — Telehealth: Payer: Self-pay | Admitting: Primary Care

## 2021-02-23 NOTE — Telephone Encounter (Signed)
Patient called wondering what Dr. Meyer Cory' thoughts are on her receiving the 2nd booster. Please advise.

## 2021-02-24 ENCOUNTER — Encounter: Payer: Self-pay | Admitting: Internal Medicine

## 2021-02-24 NOTE — Telephone Encounter (Signed)
Please let her know that Dr. Cloyde Reams is "out of the office" for a few weeks.  The second booster is approved and is safe.  As it has been 6 months since her booster, it's probably a good idea.  We don't know for sure whether it's truly needed, but preliminary data seems to show that it does help.  There is a chance she may need some other booster later this year, but as long as she's ok with that and didn't have problems with the first shots, she could go ahead and get a second booster if she wants.

## 2021-02-24 NOTE — Telephone Encounter (Signed)
Pt aware.

## 2021-02-24 NOTE — Progress Notes (Signed)
Outside notes received. Information abstracted. Notes sent to scan.  

## 2021-02-25 ENCOUNTER — Telehealth: Payer: Self-pay | Admitting: Primary Care

## 2021-02-25 DIAGNOSIS — E559 Vitamin D deficiency, unspecified: Secondary | ICD-10-CM

## 2021-02-25 DIAGNOSIS — K219 Gastro-esophageal reflux disease without esophagitis: Secondary | ICD-10-CM

## 2021-02-25 DIAGNOSIS — G40909 Epilepsy, unspecified, not intractable, without status epilepticus: Secondary | ICD-10-CM

## 2021-02-25 DIAGNOSIS — E034 Atrophy of thyroid (acquired): Secondary | ICD-10-CM

## 2021-02-25 DIAGNOSIS — E7800 Pure hypercholesterolemia, unspecified: Secondary | ICD-10-CM

## 2021-02-25 NOTE — Telephone Encounter (Signed)
Pt called looking to see if you wanted her to get bw done prior to her 4/25 appt?

## 2021-02-27 NOTE — Telephone Encounter (Signed)
Yes, I put them in

## 2021-02-28 NOTE — Telephone Encounter (Signed)
Pt.notified

## 2021-03-01 ENCOUNTER — Other Ambulatory Visit: Payer: Self-pay | Admitting: Internal Medicine

## 2021-03-02 ENCOUNTER — Ambulatory Visit: Payer: Medicare Other | Attending: Neurology | Admitting: Neurology

## 2021-03-02 DIAGNOSIS — G40909 Epilepsy, unspecified, not intractable, without status epilepticus: Secondary | ICD-10-CM | POA: Insufficient documentation

## 2021-03-03 ENCOUNTER — Telehealth: Payer: Self-pay | Admitting: Primary Care

## 2021-03-03 ENCOUNTER — Encounter: Payer: Self-pay | Admitting: Primary Care

## 2021-03-03 NOTE — Progress Notes (Signed)
Video Visit     Location of Patient: home    Location of Telemedicine Provider: hospital / clinical location    Other participants in telemedicine encounter and roles:  husband    This is an established patient visit.    Reason for visit: History of AVM and seizures    HPI  I had the pleasure of seeing her today for follow-up of her history of an arteriovenous malformation, status postresection, and residual seizure disorder.  She has been seizure-free since our last visit (actually seizure-free for many years) with no other new focal symptoms.    She continues to work from home roughly equivalent to about 2 days/week.  All of her psychological consultations are done by Zoom.    She continues to see a chiropractor when the COVID incidence rate is down.  She thinks that her word finding difficulty is about the same although her husband thought that it might be a bit better.  It is not adversely affected her ability to perform her profession.          ROS  She continues to have some difficulty with her tremor and tends to avoid eating soup out in public.  Otherwise she has been managing fairly well at home.  She continues to have some difficulty with fatigue.  She reports that she is getting up 3-4 times every night to urinate.    Patient's problem list, allergies, and medications were reviewed and updated as appropriate.  Please see the EHR for full details.    Exam and data reviewed:  She is alert and oriented.  Her speech remains clear.  Comprehension is fully intact.  She appears comfortable today    She has no facial asymmetry.  She continues to have a mild sustention tremor of her outstretched hands.      Assessment & Plan:  Stable seizure disorder currently well controlled on Tegretol.  Her last free and total levels from September were within the targeted therapeutic range.  She is scheduled for some repeat blood tests soon.    We discussed the possibility of reducing her intake of fluids prior to going to bed  at night in effort to reduce the number of awakenings.  I believe that certainly some of her fatigue during the day is related to the nocturia.  She will make some minor adjustments in her routine.  I plan on seeing her again in 6 months.  Consent was obtained from the patient to complete this video visit; including the potential for financial liability.          Evalee Mutton, MD

## 2021-03-03 NOTE — Patient Instructions (Signed)
Please reach out to the clinic with any additional questions or concerns. We encourage you to sign up and use MyChart for any non-urgent medical questions as MyChart is the preferred method of communication.  Please contact your pharmacy for medication refills and allow up to 2 business days for routine prescription refills.  For urgent messages, please call the clinic at [phone].  The clinic is open from 8am to 4:30pm Monday through Friday.  Adjust evening intake of fluids to decrease frequency of nocturia.  No other changes in meds. Follow up in 6 months.

## 2021-03-03 NOTE — Telephone Encounter (Signed)
She called again, wanted to remind [place] that [name] is immumcomprimised.  They are still asking if that is ok for son to come.

## 2021-03-03 NOTE — Telephone Encounter (Signed)
Concerned as her son is coming for Easter and he was exposed to covid more than 10 days ago and is asymptomatic.  Reassured that if he is without sxs, he is passed the time for quarantine.

## 2021-03-08 NOTE — Progress Notes (Signed)
Cardiology Office Note:    Date:  03/09/2021   ID:  Ariel Simpson, DOB 1956/11/20, MRN [id number]  PCP:  Binnie Rail, MD  Cardiologist:  Sinclair Grooms, MD   Referring MD: Binnie Rail, MD   Chief Complaint  Patient presents with  . Atrial Fibrillation  . Cardiac Valve Problem  . Loss of Consciousness    History of Present Illness:    Ariel Simpson is a 65 y.o. female with a hx of  Mitral valve prolapse, significant mitral regurgitation resulting in mitral valve repair in 2013, paroxysmal atrial fibrillation preceding surgery,operative Maze, remote history of PSVT, and recurring presyncope and syncope (x 1) with negative neuro evaluation.  Was seen by Dr. Cristopher Peru for recurrent presyncope and syncope. Monitoring in past has shown sinus brady but no profound pauses or explanation for syncope. Also, follows with Dr. Carles Collet for movement disorder. Also had MRI of brain by Dr. Quay Burow that revealed abnormality (see below) but does not explain syncope. Now to have a LOOP recorder placed to r/o sinus arret or profound bradycardia as explanation for the 1 instance of true syncope which occurred since last being seen..  In the interval since the above, she has done relatively well.  She feels palpitations and her overall incidence of dizzy spells has declined since changing Toprol-XL to 25 mg twice daily.  She is planning age trip to [place]  She is exercising regularly.  She denies episodes of atrial fibrillation.  She denies neurological complaints.  Past Medical History:  Diagnosis Date  . Arthritis   . Benign essential tremor   . CHF (congestive heart failure) ([place])   . Heart murmur   . High cholesterol   . Hypertension   . Insomnia   . Mitral regurgitation   . Mitral valve prolapse   . Osteopenia   . Paroxysmal atrial fibrillation ([place]) 03/11/2013  . Paroxysmal supraventricular tachycardia ([place])   . PONV (postoperative nausea and vomiting)    Dizziness and Nausea  .  S/P Maze operation for atrial fibrillation 03/18/2013   Complete bilateral atrial lesion set using cryothermy and biopolar radiofrequency ablation with oversewing of LA appendage via right mini thoracotomy  . S/P mitral valve repair 03/18/2013   Complex valvuloplasty including triangular resection of posterior leaflet, artificial Gore-tex neocord placement x4 and 63mm Sorin Memo 3D ring annuloplasty via right mini thoracotomy  . Severe mitral regurgitation 03/03/2013   Acute on chronic with acute heart failure   . Shortness of breath   . Wears glasses     Past Surgical History:  Procedure Laterality Date  . ABDOMINAL HYSTERECTOMY     hernia repair  . BIOPSY  08/26/2019   Procedure: BIOPSY;  Surgeon: Wilford Corner, MD;  Location: [facility];  Service: Endoscopy;;  . CARDIAC CATHETERIZATION    . COLONOSCOPY    . COLONOSCOPY WITH PROPOFOL N/A 08/26/2019   Procedure: COLONOSCOPY WITH PROPOFOL;  Surgeon: Wilford Corner, MD;  Location: [facility];  Service: Endoscopy;  Laterality: N/A;  . INTRAOPERATIVE TRANSESOPHAGEAL ECHOCARDIOGRAM N/A 03/18/2013   Procedure: INTRAOPERATIVE TRANSESOPHAGEAL ECHOCARDIOGRAM;  Surgeon: Rexene Alberts, MD;  Location: [facility];  Service: Open Heart Surgery;  Laterality: N/A;  . LEFT AND RIGHT HEART CATHETERIZATION WITH CORONARY ANGIOGRAM N/A 03/06/2013   Procedure: LEFT AND RIGHT HEART CATHETERIZATION WITH CORONARY ANGIOGRAM;  Surgeon: Sinclair Grooms, MD;  Location: [facility] CATH LAB;  Service: Cardiovascular;  Laterality: N/A;  . MAZE N/A 03/18/2013   Procedure: MAZE;  Surgeon: Rexene Alberts, MD;  Location: [facility];  Service: Open Heart Surgery;  Laterality: N/A;  . MITRAL VALVE REPAIR Right 03/18/2013   Procedure: MINIMALLY INVASIVE MITRAL VALVE REPAIR (MVR);  Surgeon: Rexene Alberts, MD;  Location: [facility];  Service: Open Heart Surgery;  Laterality: Right;  Marland Kitchen VARICOSE VEIN SURGERY    . WISDOM TOOTH EXTRACTION      Current Medications: Current Meds  Medication  Sig  . amoxicillin (AMOXIL) 500 MG capsule TAKE 4 CAPSULES BY MOUTH 1 HOUR BEFORE DENTAL APPOINTMENT  . aspirin 81 MG tablet Take 1 tablet (81 mg total) by mouth daily.  Marland Kitchen atorvastatin (LIPITOR) 20 MG tablet TAKE 1 TABLET BY MOUTH DAILY  . Calcium Carbonate-Vitamin D (CALCIUM + D PO) Take 1 capsule by mouth daily.   . Melatonin 10 MG TABS Take by mouth.  . metoprolol succinate (TOPROL-XL) 25 MG 24 hr tablet TAKE 1 TABLET(25 MG) BY MOUTH IN THE MORNING AND AT BEDTIME WITH OR IMMEDIATELY FOLLOWING A MEAL  . Misc Natural Products (GLUCOSAMINE CHOND COMPLEX/MSM PO) Take 1 tablet by mouth daily.  . Omega-3 Fatty Acids (FISH OIL PO) Take 2 capsules by mouth daily.  . polyethylene glycol (MIRALAX / GLYCOLAX) packet Take 17 g by mouth daily.  . TURMERIC PO Take 1 tablet by mouth daily.   Marland Kitchen zolpidem (AMBIEN) 5 MG tablet TAKE 1/2 TABLET BY MOUTH AT NIGHT AS NEEDED FOR SLEEP     Allergies:   Patient has no known allergies.   Social History   Socioeconomic History  . Marital status: Married    Spouse name: Fritz Pickerel  . Number of children: 3  . Years of education: BS  . Highest education level: Not on file  Occupational History    Employer: [employer]  Tobacco Use  . Smoking status: Never Smoker  . Smokeless tobacco: Never Used  Vaping Use  . Vaping Use: Never used  Substance and Sexual Activity  . Alcohol use: Yes    Alcohol/week: 5.0 - 6.0 standard drinks    Types: 5 - 6 Glasses of wine per week    Comment: 1 drink/day  . Drug use: No  . Sexual activity: Yes    Birth control/protection: Surgical  Other Topics Concern  . Not on file  Social History Narrative   Patient lives at home with her husband  Fritz Pickerel)  - second husband. Patient has college education B.S. 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   Caffeine- one cup daily.   Social Determinants of Health   Financial Resource Strain: Low Risk   . Difficulty of Paying Living Expenses: Not hard at  all  Food Insecurity: No Food Insecurity  . Worried About Charity fundraiser in the Last Year: Never true  . Ran Out of Food in the Last Year: Never true  Transportation Needs: No Transportation Needs  . Lack of Transportation (Medical): No  . Lack of Transportation (Non-Medical): No  Physical Activity: Sufficiently Active  . Days of Exercise per Week: 7 days  . Minutes of Exercise per Session: 60 min  Stress: Stress Concern Present  . Feeling of Stress : Very much  Social Connections: Unknown  . Frequency of Communication with Friends and Family: More than three times a week  . Frequency of Social Gatherings with Friends and Family: Once a week  . Attends Religious Services: Patient refused  . Active Member of Clubs or Organizations: Patient refused  .  Attends Archivist Meetings: Patient refused  . Marital Status: Married     Family History: The patient's family history includes Colon cancer in her paternal grandmother; Down syndrome in her child; Hypertension in her father; Pancreatic cancer in her mother; Seizures in her child; Stomach cancer in her father and paternal grandfather; Thyroid cancer in her sister.  ROS:   Please see the history of present illness.    Has difficulty sleeping.  Enjoys a glass of wine.  Does not drink wine every day.  All other systems reviewed and are negative.  EKGs/Labs/Other Studies Reviewed:    The following studies were reviewed today:  2D Doppler echocardiogram May 29, 6643:   Normal systolic function with EF 60%  Mild to moderate tricuspid regurgitation.  Mild mitral regurgitation.  Trace aortic regurgitation  No significant change since tracing performed in 2019.  BRAIN MRI 02/06/2021 IMPRESSION: No acute abnormality  Mild white matter changes most likely chronic microvascular ischemic change. Chronic microhemorrhage left medial parietal lobe. Correlate with risk factors for small vessel ischemia  1 cm fluid  collection in the right petrous apex likely trapped secretions.   Electronically Signed   By: Franchot Gallo M.D.   On: 02/07/2021 09:47  EKG:  EKG no new data  Recent Labs: 09/06/2020: ALT 16; BUN 10; Creatinine, Ser 0.83; Hemoglobin 13.1; Platelets 236.0; Potassium 4.9; Sodium 141; TSH 2.06  Recent Lipid Panel    Component Value Date/Time   CHOL 144 09/06/2020 0933   CHOL 176 08/25/2019 1134   TRIG 95.0 09/06/2020 0933   HDL 51.50 09/06/2020 0933   HDL 66 08/25/2019 1134   CHOLHDL 3 09/06/2020 0933   VLDL 19.0 09/06/2020 0933   LDLCALC 73 09/06/2020 0933   LDLCALC 95 08/25/2019 1134    Physical Exam:    VS:  BP 130/90   Pulse (!) 55   Ht 5\' 7"  (1.702 m)   Wt 160 lb 12.8 oz (72.9 kg)   SpO2 98%   BMI 25.18 kg/m     Wt Readings from Last 3 Encounters:  03/09/21 160 lb 12.8 oz (72.9 kg)  02/22/21 163 lb (73.9 kg)  01/13/21 161 lb (73 kg)     GEN: Healthy-appearing, masked.. No acute distress HEENT: Normal NECK: No JVD. LYMPHATICS: No lymphadenopathy CARDIAC: 2/6 left mid sternal systolic murmur. RRR no gallop, or edema. VASCULAR:  Normal Pulses. No bruits. RESPIRATORY:  Clear to auscultation without rales, wheezing or rhonchi  ABDOMEN: Soft, non-tender, non-distended, No pulsatile mass, MUSCULOSKELETAL: No deformity  SKIN: Warm and dry NEUROLOGIC:  Alert and oriented x 3 PSYCHIATRIC:  Normal affect   ASSESSMENT:    1. Paroxysmal atrial fibrillation (HCC)   2. Near syncope   3. S/P mitral valve repair   4. Essential hypertension   5. Hyperlipidemia LDL goal <100   6. Abnormal brain MRI    PLAN:    In order of problems listed above:  1. Status post ablation/maze procedure.  No identified episodes of atrial fib. 2. Loop recorder to investigate the 1 episode of syncope that occurred in absence of prodrome.  This will be to rule out sinus node dysfunction and sinus pauses or perhaps even AV block episodes that are transient. 3. Echo performed in  [place] July 2021 demonstrated a stable mitral valve repair with mild mitral and mild to moderate tricuspid regurgitation accounting for the murmur on today's exam. 4. Continue low intensity atorvastatin 20 mg/day. 5. We did not discuss her brain MRI.  Plan to see the patient back in 6 months.  No change in medical therapy.  Encouraged to limit alcohol intake.   Medication Adjustments/Labs and Tests Ordered: Current medicines are reviewed at length with the patient today.  Concerns regarding medicines are outlined above.  No orders of the defined types were placed in this encounter.  No orders of the defined types were placed in this encounter.   Patient Instructions  Medication Instructions:  Your physician recommends that you continue on your current medications as directed. Please refer to the Current Medication list given to you today.  *If you need a refill on your cardiac medications before your next appointment, please call your pharmacy*   Lab Work: None If you have labs (blood work) drawn today and your tests are completely normal, you will receive your results only by: Marland Kitchen MyChart Message (if you have MyChart) OR . A paper copy in the mail If you have any lab test that is abnormal or we need to change your treatment, we will call you to review the results.   Testing/Procedures: None   Follow-Up: At [facility], you and your health needs are our priority.  As part of our continuing mission to provide you with exceptional heart care, we have created designated Provider Care Teams.  These Care Teams include your primary Cardiologist (physician) and Advanced Practice Providers (APPs -  Physician Assistants and Nurse Practitioners) who all work together to provide you with the care you need, when you need it.  We recommend signing up for the patient portal called "MyChart".  Sign up information is provided on this After Visit Summary.  MyChart is used to connect with  patients for Virtual Visits (Telemedicine).  Patients are able to view lab/test results, encounter notes, upcoming appointments, etc.  Non-urgent messages can be sent to your provider as well.   To learn more about what you can do with MyChart, go to [email or web address].    Your next appointment:   6 month(s)  The format for your next appointment:   In Person  Provider:   You may see Sinclair Grooms, MD or one of the following Advanced Practice Providers on your designated Care Team:    Kathyrn Drown, NP    Other Instructions      Signed, Sinclair Grooms, MD  03/09/2021 9:51 AM    [place]

## 2021-03-09 ENCOUNTER — Encounter: Payer: Self-pay | Admitting: Interventional Cardiology

## 2021-03-09 ENCOUNTER — Ambulatory Visit: Payer: Medicare Other | Admitting: Interventional Cardiology

## 2021-03-09 ENCOUNTER — Other Ambulatory Visit: Payer: Self-pay

## 2021-03-09 VITALS — BP 130/90 | HR 55 | Ht 67.0 in | Wt 160.8 lb

## 2021-03-09 DIAGNOSIS — E785 Hyperlipidemia, unspecified: Secondary | ICD-10-CM | POA: Diagnosis not present

## 2021-03-09 DIAGNOSIS — I10 Essential (primary) hypertension: Secondary | ICD-10-CM | POA: Diagnosis not present

## 2021-03-09 DIAGNOSIS — I480 Paroxysmal atrial fibrillation: Secondary | ICD-10-CM

## 2021-03-09 DIAGNOSIS — R55 Syncope and collapse: Secondary | ICD-10-CM | POA: Diagnosis not present

## 2021-03-09 DIAGNOSIS — Z9889 Other specified postprocedural states: Secondary | ICD-10-CM

## 2021-03-09 DIAGNOSIS — R9089 Other abnormal findings on diagnostic imaging of central nervous system: Secondary | ICD-10-CM

## 2021-03-09 NOTE — Patient Instructions (Signed)

## 2021-03-11 ENCOUNTER — Other Ambulatory Visit
Admission: RE | Admit: 2021-03-11 | Discharge: 2021-03-11 | Disposition: A | Discharge status: Home / Self Care | Payer: Medicare Other | Source: Ambulatory Visit

## 2021-03-11 ENCOUNTER — Other Ambulatory Visit
Admission: RE | Admit: 2021-03-11 | Discharge: 2021-03-11 | Disposition: A | Discharge status: Home / Self Care | Payer: Medicare Other | Source: Ambulatory Visit | Attending: Primary Care | Admitting: Primary Care

## 2021-03-11 DIAGNOSIS — G40909 Epilepsy, unspecified, not intractable, without status epilepticus: Secondary | ICD-10-CM | POA: Insufficient documentation

## 2021-03-11 DIAGNOSIS — E7800 Pure hypercholesterolemia, unspecified: Secondary | ICD-10-CM

## 2021-03-11 DIAGNOSIS — K219 Gastro-esophageal reflux disease without esophagitis: Secondary | ICD-10-CM | POA: Insufficient documentation

## 2021-03-11 DIAGNOSIS — M810 Age-related osteoporosis without current pathological fracture: Secondary | ICD-10-CM | POA: Insufficient documentation

## 2021-03-11 DIAGNOSIS — E559 Vitamin D deficiency, unspecified: Secondary | ICD-10-CM | POA: Insufficient documentation

## 2021-03-11 DIAGNOSIS — E034 Atrophy of thyroid (acquired): Secondary | ICD-10-CM

## 2021-03-11 LAB — BASIC METABOLIC PANEL
Anion Gap: 16 (ref 7–16)
CO2: 25 mmol/L (ref 20–28)
Calcium: 10.1 mg/dL (ref 8.6–10.2)
Chloride: 98 mmol/L (ref 96–108)
Creatinine: 0.54 mg/dL (ref 0.51–0.95)
Glucose: 96 mg/dL (ref 60–99)
Lab: 15 mg/dL (ref 6–20)
Potassium: 4.1 mmol/L (ref 3.3–5.1)
Sodium: 139 mmol/L (ref 133–145)
eGFR BY CREAT: 101 *

## 2021-03-11 LAB — COMPREHENSIVE METABOLIC PANEL
ALT: 33 U/L (ref 0–35)
AST: 29 U/L (ref 0–35)
Albumin: 5 g/dL (ref 3.5–5.2)
Alk Phos: 79 U/L (ref 35–105)
Anion Gap: 15 (ref 7–16)
Bilirubin,Total: 0.3 mg/dL (ref 0.0–1.2)
CO2: 26 mmol/L (ref 20–28)
Calcium: 10.2 mg/dL (ref 8.6–10.2)
Chloride: 99 mmol/L (ref 96–108)
Creatinine: 0.53 mg/dL (ref 0.51–0.95)
Glucose: 97 mg/dL (ref 60–99)
Lab: 15 mg/dL (ref 6–20)
Potassium: 4.1 mmol/L (ref 3.3–5.1)
Sodium: 140 mmol/L (ref 133–145)
Total Protein: 7.6 g/dL (ref 6.3–7.7)
eGFR BY CREAT: 101 *

## 2021-03-11 LAB — CBC
Hematocrit: 43 % (ref 34–45)
Hemoglobin: 14.3 g/dL (ref 11.2–15.7)
MCH: 33 pg — ABNORMAL HIGH (ref 26–32)
MCHC: 33 g/dL (ref 32–36)
MCV: 99 fL — ABNORMAL HIGH (ref 79–95)
RBC: 4.4 MIL/uL (ref 3.9–5.2)
RDW: 13.2 % (ref 11.7–14.4)
WBC: 4.5 10*3/uL (ref 4.0–10.0)

## 2021-03-11 LAB — LIPID PANEL
Chol/HDL Ratio: 1.8
Cholesterol: 233 mg/dL — AB
HDL: 130 mg/dL — ABNORMAL HIGH (ref 40–60)
LDL Calculated: 87 mg/dL
Non HDL Cholesterol: 103 mg/dL
Triglycerides: 80 mg/dL

## 2021-03-11 LAB — PTH, INTACT: Intact PTH: 54 pg/mL (ref 15.0–65.0)

## 2021-03-11 LAB — RESOLUTION

## 2021-03-11 LAB — MAGNESIUM
Magnesium: 2.2 mg/dL (ref 1.6–2.5)
Magnesium: 2.2 mg/dL (ref 1.6–2.5)

## 2021-03-11 LAB — TSH: TSH: 1.59 u[IU]/mL (ref 0.27–4.20)

## 2021-03-11 LAB — VITAMIN D: 25-OH Vit Total: 49 ng/mL (ref 30–60)

## 2021-03-14 ENCOUNTER — Encounter: Payer: Self-pay | Admitting: Primary Care

## 2021-03-14 ENCOUNTER — Ambulatory Visit: Payer: Medicare Other | Attending: Primary Care | Admitting: Primary Care

## 2021-03-14 VITALS — BP 112/80 | HR 68 | Wt 106.2 lb

## 2021-03-14 DIAGNOSIS — E034 Atrophy of thyroid (acquired): Secondary | ICD-10-CM | POA: Insufficient documentation

## 2021-03-14 DIAGNOSIS — E7800 Pure hypercholesterolemia, unspecified: Secondary | ICD-10-CM

## 2021-03-14 DIAGNOSIS — G40909 Epilepsy, unspecified, not intractable, without status epilepticus: Secondary | ICD-10-CM

## 2021-03-14 DIAGNOSIS — B351 Tinea unguium: Secondary | ICD-10-CM | POA: Insufficient documentation

## 2021-03-14 DIAGNOSIS — I10 Essential (primary) hypertension: Secondary | ICD-10-CM | POA: Insufficient documentation

## 2021-03-14 LAB — CARBAMAZEPINE, FREE & TOTAL
Carbamazepine, Free: 20 % (ref 8.0–35.0)
Carbamazepine,Free: 2 ug/mL (ref 1.0–3.0)
Carbamazepine,Total: 10 ug/mL (ref 4.0–12.0)

## 2021-03-14 MED ORDER — TERBINAFINE HCL 250 MG PO TABS *I*
250.0000 mg | ORAL_TABLET | Freq: Every day | ORAL | 0 refills | Status: DC
Start: 2021-03-14 — End: 2021-06-09

## 2021-03-14 NOTE — Progress Notes (Signed)
Reason for Visit: Hypertension    Ciclopirox has not helped the great toenail is cloudy and dystrophic  She has started to note some nail thickening in the second toe as well     GERD is stable; discussed that there are no new options at this point, continuing to do our best to limit PPI exposure; on lowest effective dose    BPs remain well controlled on low dose amlodipine    Lipids well controlled on low dose atorvastatin    Recent Results (from the past 336 hour(s))   PTH, intact    Collection Time: 03/11/21 11:40 AM   Result Value Ref Range    Intact PTH 54.0 15.0 - 65.0 pg/mL   TSH    Collection Time: 03/11/21 11:40 AM   Result Value Ref Range    TSH 1.59 0.27 - 4.20 uIU/mL   CBC    Collection Time: 03/11/21 11:40 AM   Result Value Ref Range    WBC 4.5 4.0 - 10.0 THOU/uL    RBC 4.4 3.9 - 5.2 MIL/uL    Hemoglobin 14.3 11.2 - 15.7 g/dL    Hematocrit 43 34 - 45 %    MCV 99 (H) 79 - 95 fL    MCH 33 (H) 26 - 32 pg    MCHC 33 32 - 36 g/dL    RDW 13.2 11.7 - 14.4 %    Platelets CANCELED 160 - 370 THOU/uL   Comprehensive metabolic panel    Collection Time: 03/11/21 11:40 AM   Result Value Ref Range    Sodium 140 133 - 145 mmol/L    Potassium 4.1 3.3 - 5.1 mmol/L    Chloride 99 96 - 108 mmol/L    CO2 26 20 - 28 mmol/L    Anion Gap 15 7 - 16    UN 15 6 - 20 mg/dL    Creatinine 0.53 0.51 - 0.95 mg/dL    eGFR BY CREAT 101 *    Glucose 97 60 - 99 mg/dL    Calcium 10.2 8.6 - 10.2 mg/dL    Total Protein 7.6 6.3 - 7.7 g/dL    Albumin 5.0 3.5 - 5.2 g/dL    Bilirubin,Total 0.3 0.0 - 1.2 mg/dL    AST 29 0 - 35 U/L    ALT 33 0 - 35 U/L    Alk Phos 79 35 - 105 U/L   Lipid Panel (Reflex to Direct  LDL if Triglycerides more than 400)    Collection Time: 03/11/21 11:40 AM   Result Value Ref Range    Cholesterol 233 (!) mg/dL    Triglycerides 80 mg/dL    HDL 130 (H) 40 - 60 mg/dL    LDL Calculated 87 mg/dL    Non HDL Cholesterol 103 mg/dL    Chol/HDL Ratio 1.8    Carbamazepine, free & total    Collection Time: 03/11/21 11:40 AM    Result Value Ref Range    Carbamazepine,Free 2.0 1.0 - 3.0 ug/mL    Carbamazepine, Free 20.0 8.0 - 35.0 %    Carbamazepine,Total 10.0 4.0 - 12.0 ug/mL   Vitamin D    Collection Time: 03/11/21 11:40 AM   Result Value Ref Range    25-OH Vit Total 49 30 - 60 ng/mL   Magnesium    Collection Time: 03/11/21 11:40 AM   Result Value Ref Range    Magnesium 2.2 1.6 - 2.5 mg/dL   Basic metabolic panel    Collection Time: 03/11/21  11:40 AM   Result Value Ref Range    Glucose 96 60 - 99 mg/dL    Sodium 139 133 - 145 mmol/L    Potassium 4.1 3.3 - 5.1 mmol/L    Chloride 98 96 - 108 mmol/L    CO2 25 20 - 28 mmol/L    Anion Gap 16 7 - 16    UN 15 6 - 20 mg/dL    Creatinine 0.54 0.51 - 0.95 mg/dL    eGFR BY CREAT 101 *    Calcium 10.1 8.6 - 10.2 mg/dL   Magnesium    Collection Time: 03/11/21 11:40 AM   Result Value Ref Range    Magnesium 2.2 1.6 - 2.5 mg/dL   Resolution    Collection Time: 03/11/21 12:38 PM   Result Value Ref Range    Resolution see text         Patient Active Problem List   Diagnosis Code    Reported Trauma Neck     Allergic rhinitis J30.9    Irritable bowel syndrome K58.9    Essential hypertension I10    Dysplastic Nevus I78.1    Rosacea L71.9    Osteoporosis M81.0    Hypothyroidism E03.9    Scoliosis M41.20    Urge Incontinence Of Urine N39.41    A-V Malformation Repair Supratentorial Complex     Seizure disorder G40.909    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    Ringing In The Ears (Tinnitus) H93.19    GERD (gastroesophageal reflux disease) K21.9    Pain in joint, ankle and foot M25.579    Weakness of foot R29.898    Hyperlipidemia E78.5     Current Outpatient Medications   Medication    atorvastatin (LIPITOR) 10 mg tablet    pseudoephedrine (SUDAFED) 30 mg tablet    ciclopirox (PENLAC) 8 % solution    carBAMazepine (CARBATROL) 100 mg 12 hr capsule    fluconazole (DIFLUCAN) 150 mg tablet    carBAMazepine (TEGRETOL XR) 200 mg 12 hr tablet    azelastine (ASTEPRO) 137 MCG/SPRAY  SOLN nasal solution    amLODIPine (NORVASC) 2.5 mg tablet    levothyroxine (SYNTHROID, LEVOTHROID) 25 MCG tablet    triamcinolone (KENALOG) 0.1 % ointment    pseudoephedrine ([employer] 12 HOUR NASAL DECONGESTANT) 120 MG 12 hr tablet    solifenacin (VESICARE) 10 MG tablet    RABEprazole (ACIPHEX) 20 MG tablet    EPINEPHrine (EPIPEN) 0.3 mg/0.3 mL auto-injector    beclomethasone (QNASL) 80 MCG/ACT nasal spray    fluticasone (FLOVENT DISKUS) 250 MCG/BLIST diskus inhaler    generic DME    Non-System Medication    Calcium Carb-Cholecalciferol 600-200 MG-UNIT TABS    Denosumab (PROLIA SC)    loratadine (CLARITIN) 10 MG tablet    Multiple Vitamin (MULTIVITAMIN) per tablet    cholecalciferol (VITAMIN D) 1000 UNITS capsule    cyclobenzaprine (FLEXERIL) 5 MG tablet    doxycycline hyclate (VIBRA-TABS) 100 MG tablet    famotidine (PEPCID) 20 MG tablet     No current facility-administered medications for this visit.       Medication list reviewed and updated,  changes were made today    Exam: BP 112/80    Pulse 68    Wt 48.2 kg (106 lb 3.2 oz)    BMI 18.82 kg/m   65 yo woman in nad.  Neck is supple without lad, no supraclavicular adenopathy  Lungs are CTA bilaterally, resp easy. No wheezes, rales or rhonchi  Cardiac exam  reveals nl S1S2 RRR  Breast exam is without appreciable mass, no skin changes or nipple inversion, no axillary adenopathy  Abdomen is soft, nontender, nl BS appreciated  Extremities are without edema    A/P:  1.  Onychomycosis--nail is now thick, opaque with yellow discoloration, progressed from previously when ciclopirox was initiated; will initiate 12 week course of lamisil with plans to recheck LFTs in three months in case extension needs to be made as this process can be slow  Okay to hold statin while on lamisil  D/c ciclopirox  2. htn--well controlled on amlodipine, no changes were made  3. Hypothyroidism--tsh is wnl on current dose of levothyroxine  4. hyperlipidemia--as above, would  recommend that she hold the atorvastatin while on lamisil    40 min personally spent on the date of the encounter including face to face pre and post visit work

## 2021-03-18 ENCOUNTER — Encounter: Payer: Self-pay | Admitting: Primary Care

## 2021-03-21 ENCOUNTER — Other Ambulatory Visit: Payer: Self-pay | Admitting: Primary Care

## 2021-03-22 ENCOUNTER — Other Ambulatory Visit: Payer: Self-pay | Admitting: Primary Care

## 2021-04-19 ENCOUNTER — Other Ambulatory Visit: Payer: Self-pay

## 2021-04-19 ENCOUNTER — Encounter: Payer: Self-pay | Admitting: Internal Medicine

## 2021-04-19 ENCOUNTER — Ambulatory Visit: Payer: Medicare Other | Admitting: Internal Medicine

## 2021-04-19 VITALS — BP 146/84 | HR 59 | Ht 67.0 in | Wt 162.4 lb

## 2021-04-19 DIAGNOSIS — R55 Syncope and collapse: Secondary | ICD-10-CM

## 2021-04-19 NOTE — Patient Instructions (Addendum)
Medication Instructions:  Your physician recommends that you continue on your current medications as directed. Please refer to the Current Medication list given to you today.  Labwork: None ordered.  Testing/Procedures: None ordered.  Follow-Up:  Your physician wants you to follow-up in: 4 months with Dr. [name].    You will receive a reminder letter in the mail two months in advance. If you don't receive a letter, please call our office to schedule the follow-up appointment.   Implantable Loop Recorder Placement, Care After This sheet gives you information about how to care for yourself after your procedure. Your health care provider may also give you more specific instructions. If you have problems or questions, contact your health care provider. What can I expect after the procedure? After the procedure, it is common to have:  Soreness or discomfort near the incision.  Some swelling or bruising near the incision.  Follow these instructions at home: Incision care  1.  Leave your outer dressing on for 72 hours.  After 72 hours you can remove your outer dressing and shower. 2. Leave adhesive strips in place. These skin closures may need to stay in place for 1-2 weeks. If adhesive strip edges start to loosen and curl up, you may trim the loose edges.  You may remove the strips if they have not fallen off after 2 weeks. 3. Check your incision area every day for signs of infection. Check for: a. Redness, swelling, or pain. b. Fluid or blood. c. Warmth. d. Pus or a bad smell. 4. Do not take baths, swim, or use a hot tub until your incision is completely healed. 5. If your wound site starts to bleed apply pressure.      If you have any questions/concerns please call the device clinic at [phone].  Activity  Return to your normal activities.  General instructions  Follow instructions from your health care provider about how to manage your implantable loop recorder and  transmit the information. Learn how to activate a recording if this is necessary for your type of device.  You may go through a metal detection gate, and you may let someone hold a metal detector over your chest. Show your ID card if needed.    Do not have an MRI unless you check with your health care provider first.  Take over-the-counter and prescription medicines only as told by your health care provider.  Keep all follow-up visits as told by your health care provider. This is important. Contact a health care provider if:  You have redness, swelling, or pain around your incision.  You have a fever.  You have pain that is not relieved by your pain medicine.  You have triggered your device because of fainting (syncope) or because of a heartbeat that feels like it is racing, slow, fluttering, or skipping (palpitations). Get help right away if you have:  Chest pain.  Difficulty breathing. Summary  After the procedure, it is common to have soreness or discomfort near the incision.  Change your dressing as told by your health care provider.  Follow instructions from your health care provider about how to manage your implantable loop recorder and transmit the information.  Keep all follow-up visits as told by your health care provider. This is important. This information is not intended to replace advice given to you by your health care provider. Make sure you discuss any questions you have with your health care provider. Document Released: 10/18/2015 Document Revised: 12/22/2017 Document Reviewed: 12/22/2017  Elsevier Patient Education  2020 [employer]   

## 2021-04-19 NOTE — Progress Notes (Signed)
HPI Ms. Bloodgood returns today for followup. She is a pleasant 65 yo woman with a h/o unexplained syncope as well as prior mitral valve repair/MAZE and autonomic dysfunction. I saw her in March and recommended ILR. She returns today for ILR insertion.  No Known Allergies   Current Outpatient Medications  Medication Sig Dispense Refill  . amoxicillin (AMOXIL) 500 MG capsule TAKE 4 CAPSULES BY MOUTH 1 HOUR BEFORE DENTAL APPOINTMENT 4 capsule 3  . aspirin 81 MG tablet Take 1 tablet (81 mg total) by mouth daily.    Marland Kitchen atorvastatin (LIPITOR) 20 MG tablet TAKE 1 TABLET BY MOUTH DAILY 90 tablet 1  . Calcium Carbonate-Vitamin D (CALCIUM + D PO) Take 1 capsule by mouth daily.     . Melatonin 10 MG TABS Take by mouth.    . metoprolol succinate (TOPROL-XL) 25 MG 24 hr tablet TAKE 1 TABLET(25 MG) BY MOUTH IN THE MORNING AND AT BEDTIME WITH OR IMMEDIATELY FOLLOWING A MEAL 90 tablet 3  . Misc Natural Products (GLUCOSAMINE CHOND COMPLEX/MSM PO) Take 1 tablet by mouth daily.    . Omega-3 Fatty Acids (FISH OIL PO) Take 2 capsules by mouth daily.    . polyethylene glycol (MIRALAX / GLYCOLAX) packet Take 17 g by mouth daily.    . TURMERIC PO Take 1 tablet by mouth daily.     Marland Kitchen zolpidem (AMBIEN) 5 MG tablet TAKE 1/2 TABLET BY MOUTH AT NIGHT AS NEEDED FOR SLEEP 30 tablet 0   No current facility-administered medications for this visit.     Past Medical History:  Diagnosis Date  . Arthritis   . Benign essential tremor   . CHF (congestive heart failure) ([place])   . Heart murmur   . High cholesterol   . Hypertension   . Insomnia   . Mitral regurgitation   . Mitral valve prolapse   . Osteopenia   . Paroxysmal atrial fibrillation ([place]) 03/11/2013  . Paroxysmal supraventricular tachycardia ([place])   . PONV (postoperative nausea and vomiting)    Dizziness and Nausea  . S/P Maze operation for atrial fibrillation 03/18/2013   Complete bilateral atrial lesion set using cryothermy and biopolar  radiofrequency ablation with oversewing of LA appendage via right mini thoracotomy  . S/P mitral valve repair 03/18/2013   Complex valvuloplasty including triangular resection of posterior leaflet, artificial Gore-tex neocord placement x4 and 26mm Sorin Memo 3D ring annuloplasty via right mini thoracotomy  . Severe mitral regurgitation 03/03/2013   Acute on chronic with acute heart failure   . Shortness of breath   . Wears glasses     ROS:   All systems reviewed and negative except as noted in the HPI.   Past Surgical History:  Procedure Laterality Date  . ABDOMINAL HYSTERECTOMY     hernia repair  . BIOPSY  08/26/2019   Procedure: BIOPSY;  Surgeon: Wilford Corner, MD;  Location: [facility];  Service: Endoscopy;;  . CARDIAC CATHETERIZATION    . COLONOSCOPY    . COLONOSCOPY WITH PROPOFOL N/A 08/26/2019   Procedure: COLONOSCOPY WITH PROPOFOL;  Surgeon: Wilford Corner, MD;  Location: [facility];  Service: Endoscopy;  Laterality: N/A;  . INTRAOPERATIVE TRANSESOPHAGEAL ECHOCARDIOGRAM N/A 03/18/2013   Procedure: INTRAOPERATIVE TRANSESOPHAGEAL ECHOCARDIOGRAM;  Surgeon: Rexene Alberts, MD;  Location: [facility];  Service: Open Heart Surgery;  Laterality: N/A;  . LEFT AND RIGHT HEART CATHETERIZATION WITH CORONARY ANGIOGRAM N/A 03/06/2013   Procedure: LEFT AND RIGHT HEART CATHETERIZATION WITH CORONARY ANGIOGRAM;  Surgeon: Lynnell Dike  Blenda Bridegroom, MD;  Location: [facility] CATH LAB;  Service: Cardiovascular;  Laterality: N/A;  . MAZE N/A 03/18/2013   Procedure: MAZE;  Surgeon: Rexene Alberts, MD;  Location: [facility];  Service: Open Heart Surgery;  Laterality: N/A;  . MITRAL VALVE REPAIR Right 03/18/2013   Procedure: MINIMALLY INVASIVE MITRAL VALVE REPAIR (MVR);  Surgeon: Rexene Alberts, MD;  Location: [facility];  Service: Open Heart Surgery;  Laterality: Right;  Marland Kitchen VARICOSE VEIN SURGERY    . WISDOM TOOTH EXTRACTION       Family History  Problem Relation Age of Onset  . Pancreatic cancer Mother   . Stomach  cancer Father   . Hypertension Father   . Thyroid cancer Sister   . Colon cancer Paternal Grandmother   . Stomach cancer Paternal Grandfather   . Seizures Child   . Down syndrome Child      Social History   Socioeconomic History  . Marital status: Married    Spouse name: Fritz Pickerel  . Number of children: 3  . Years of education: BS  . Highest education level: Not on file  Occupational History    Employer: [employer]  Tobacco Use  . Smoking status: Never Smoker  . Smokeless tobacco: Never Used  Vaping Use  . Vaping Use: Never used  Substance and Sexual Activity  . Alcohol use: Yes    Alcohol/week: 5.0 - 6.0 standard drinks    Types: 5 - 6 Glasses of wine per week    Comment: 1 drink/day  . Drug use: No  . Sexual activity: Yes    Birth control/protection: Surgical  Other Topics Concern  . Not on file  Social History Narrative   Patient lives at home with her husband  Fritz Pickerel)  - second husband. Patient has college education B.S. 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   Caffeine- one cup daily.   Social Determinants of Health   Financial Resource Strain: Low Risk   . Difficulty of Paying Living Expenses: Not hard at all  Food Insecurity: No Food Insecurity  . Worried About Charity fundraiser in the Last Year: Never true  . Ran Out of Food in the Last Year: Never true  Transportation Needs: No Transportation Needs  . Lack of Transportation (Medical): No  . Lack of Transportation (Non-Medical): No  Physical Activity: Sufficiently Active  . Days of Exercise per Week: 7 days  . Minutes of Exercise per Session: 60 min  Stress: Stress Concern Present  . Feeling of Stress : Very much  Social Connections: Unknown  . Frequency of Communication with Friends and Family: More than three times a week  . Frequency of Social Gatherings with Friends and Family: Once a week  . Attends Religious Services: Patient refused  . Active Member of Clubs  or Organizations: Patient refused  . Attends Archivist Meetings: Patient refused  . Marital Status: Married  Human resources officer Violence: Not on file     BP (!) 146/84   Pulse (!) 59   Ht 5\' 7"  (1.702 m)   Wt 162 lb 6.4 oz (73.7 kg)   SpO2 98%   BMI 25.44 kg/m   Physical Exam:  Well appearing 65 yo woman, NAD HEENT: Unremarkable Neck:  No JVD, no thyromegally Lymphatics:  No adenopathy Back:  No CVA tenderness Lungs:  Clear with no wheezes HEART:  Regular rate rhythm, no murmurs, no rubs, no clicks Abd:  soft,  positive bowel sounds, no organomegally, no rebound, no guarding Ext:  2 plus pulses, no edema, no cyanosis, no clubbing Skin:  No rashes no nodules Neuro:  CN II through XII intact, motor grossly intact  EP Procedure Note  Procedure Performed: ILR insertion   Preoperative diagnosis: unexplained syncope  Postoperative diagnosis: same as preoperative diagnosis  Description of the procedure: after informed consent was obtained, the patient was prepped and draped in a sterile fashion. 10 cc of lidocaine was infiltrated. A one cm stab incision was carried out. The medtronic ILR, [id number] was inserted. The R waves measured 0.22mV. Benzoin and steristrips were painted on the skin and a bandage was applied. The patient was recovered in the usual manner.  Complications: none immediately.  Conclusion: successful ILR insertion.   Assess/Plan: 1. Unexplained syncope - she has undergone insertion of a Medtronic ILR.  2. PAF - we will be able to monitor this as she is not on systemic anti-coagulation. She has not had any known arrhythmias since her MAZE.  3. SVT - she has had 2 known episodes. We will follow with the ILR in place.   Carleene Overlie Neyland Pettengill,MD

## 2021-04-21 ENCOUNTER — Other Ambulatory Visit: Payer: Self-pay | Admitting: Primary Care

## 2021-04-22 ENCOUNTER — Telehealth: Payer: Self-pay

## 2021-04-22 NOTE — Telephone Encounter (Signed)
Spoke with pt regarding her ILR implant site and responded to my chart message.  Pt reports the blood seen on steri-strips is dry.

## 2021-04-22 NOTE — Telephone Encounter (Signed)
The patient states she removed her bandage but have questions about what she is seeing. She did send a picture.

## 2021-04-25 ENCOUNTER — Other Ambulatory Visit: Payer: Self-pay | Admitting: Primary Care

## 2021-05-05 MED ORDER — METOPROLOL SUCCINATE ER 25 MG PO TB24: ORAL_TABLET | ORAL | 1 refills | Status: DC

## 2021-05-06 ENCOUNTER — Other Ambulatory Visit: Payer: Self-pay | Admitting: Internal Medicine

## 2021-05-06 ENCOUNTER — Encounter: Payer: Self-pay | Admitting: Internal Medicine

## 2021-05-06 DIAGNOSIS — F5104 Psychophysiologic insomnia: Secondary | ICD-10-CM

## 2021-05-06 MED ORDER — ZOLPIDEM TARTRATE 5 MG PO TABS: 2.5000 mg | ORAL_TABLET | Freq: Every evening | ORAL | 0 refills | Status: DC | PRN

## 2021-05-18 ENCOUNTER — Other Ambulatory Visit
Admission: RE | Admit: 2021-05-18 | Discharge: 2021-05-18 | Disposition: A | Discharge status: Home / Self Care | Payer: Medicare Other | Source: Ambulatory Visit | Attending: Obstetrics | Admitting: Obstetrics

## 2021-05-18 DIAGNOSIS — Z124 Encounter for screening for malignant neoplasm of cervix: Secondary | ICD-10-CM | POA: Insufficient documentation

## 2021-05-18 DIAGNOSIS — Z1151 Encounter for screening for human papillomavirus (HPV): Secondary | ICD-10-CM | POA: Insufficient documentation

## 2021-05-20 ENCOUNTER — Encounter: Payer: Self-pay | Admitting: Orthopedic Surgery

## 2021-05-20 ENCOUNTER — Ambulatory Visit
Admission: RE | Admit: 2021-05-20 | Discharge: 2021-05-20 | Disposition: A | Discharge status: Home / Self Care | Payer: Medicare Other | Source: Ambulatory Visit | Admitting: Radiology

## 2021-05-20 ENCOUNTER — Ambulatory Visit: Payer: Medicare Other | Attending: Physician Assistant | Admitting: Physician Assistant

## 2021-05-20 VITALS — BP 142/81 | Ht 63.0 in | Wt 105.0 lb

## 2021-05-20 DIAGNOSIS — M85811 Other specified disorders of bone density and structure, right shoulder: Secondary | ICD-10-CM

## 2021-05-20 DIAGNOSIS — M25511 Pain in right shoulder: Secondary | ICD-10-CM | POA: Insufficient documentation

## 2021-05-20 NOTE — Progress Notes (Signed)
Supervising Physician:  Dr. Caroline More      CC:  Right shoulder pain.      HPI:  [name] is a 65 year old right hand dominant Caucasian female with a past medical history significant for a stroke, hypertension, seizure disorder, hypothyroidism, GERD and osteoporosis who is accompanied by her husband today and presents with right shoulder pain.  She states yesterday when lifting a chair to move it she felt a pain in the front of her right shoulder.  She has used ice and taken Advil for pain.  She denies any radicular symptoms.  She denies any previous issues or injury to her shoulder.      PMH:  Past Medical History:   Diagnosis Date    Contusion of foot 06/02/2013    Hypertension     Hypothyroidism     Osteoporosis     Seizure     Stroke        PSH:  Past Surgical History:   Procedure Laterality Date    CONVERTED PROCEDURE      Craniotomy A-V Malformation Repair Conversion Data     HX TONSILLECTOMY/ADENOIDECTOMY      Tonsillectomy Conversion Data     TONSILLECTOMY         Medications:  Current Outpatient Medications   Medication Sig    azelastine (ASTEPRO) 137 MCG/SPRAY SOLN nasal solution SPRAY 2 SPRAYS IN EACH NOSTRIL TWO TIMES DAILY    RABEprazole (ACIPHEX) 20 MG tablet TAKE 1 TABLET BY MOUTH EVERY DAY    [employer] 12 HOUR NASAL DECONGESTANT 120 MG 12 hr tablet TAKE 1 TABLET BY MOUTH EVERY 12 HOURS    pseudoephedrine (SUDAFED) 30 mg tablet TAKE 1 TABLET BY MOUTH EVERY 4 HOURS AS NEEDED FOR CONGESTION    terbinafine (LAMISIL) 250 mg tablet Take 1 tablet (250 mg total) by mouth daily  for Fungal Toenail Disease    atorvastatin (LIPITOR) 10 mg tablet TAKE 1 TABLET BY MOUTH EVERY DAY WITH DINNER    ciclopirox (PENLAC) 8 % solution Apply topically nightly  to affected nails & adjacent skin daily.  Remove with alcohol every 7 days.    carBAMazepine (CARBATROL) 100 mg 12 hr capsule TAKE 1 CAPSULE BY MOUTH TWO TIMES DAILY    fluconazole (DIFLUCAN) 150 mg tablet Take one tab, may repeat times one in 72  hours if no relief; hold atorvastatin for one week while taking this    carBAMazepine (TEGRETOL XR) 200 mg 12 hr tablet TAKE 2 TABLETS BY MOUTH TWO TIMES DAILY WITH MEALS SWALLOW WHOLE, DO NOT BREAK,CRUSH OR CHEW ALONG WITH 100MG  TABLET    amLODIPine (NORVASC) 2.5 mg tablet Take 1 tablet (2.5 mg total) by mouth daily    levothyroxine (SYNTHROID, LEVOTHROID) 25 MCG tablet TAKE 1 TABLET BY MOUTH EVERY DAY BEFORE BREAKFAST    triamcinolone (KENALOG) 0.1 % ointment Apply topically 2 times daily to the following areas: ear    solifenacin (VESICARE) 10 MG tablet TAKE 1 TABLET BY MOUTH EVERY DAY    EPINEPHrine (EPIPEN) 0.3 mg/0.3 mL auto-injector Inject 0.3 mLs (0.3 mg total) into the muscle as needed for Anaphylaxis    beclomethasone (QNASL) 80 MCG/ACT nasal spray 2 sprays by Nasal route daily    fluticasone (FLOVENT DISKUS) 250 MCG/BLIST diskus inhaler Inhale 1 puff into the lungs 2 times daily    generic DME Bilateral custom orthotics; Dx: bilateral foot pain    Non-System Medication Bilateral custom-made orthotics  Dx: bilateral foot pain, history of stroke, disp: 1pair  Calcium Carb-Cholecalciferol 600-200 MG-UNIT TABS Take 1 tablet by mouth 3 times daily    Denosumab (PROLIA SC) Inject 1 Device into the skin every 6 months    loratadine (CLARITIN) 10 MG tablet Take 10 mg by mouth daily    Multiple Vitamin (MULTIVITAMIN) per tablet Take 1 tablet by mouth daily    cholecalciferol (VITAMIN D) 1000 UNITS capsule Take 1,000 Units by mouth daily    famotidine (PEPCID) 20 MG tablet Take 1 tablet (20 mg total) by mouth 2 times daily       Allergies:  Allergies   Allergen Reactions    Penicillins Hives    Sulfa Antibiotics Other (See Comments)     Serum sickness    Bactrim Other (See Comments)     Serum sickness    Phenytoin Sodium Extended Swelling and Other (See Comments)      Edema    Seasonal Allergies Other (See Comments)     Trees,pollen,hay fever,mold          Physical Exam:    Vitals:     05/20/21 1733   BP: 142/81   Weight: 47.6 kg (105 lb)   Height: 1.6 m (5\' 3" )     General:  Appears in no acute distress.  Respiratory:  Non labored pattern.  Extremities:   Left upper extremity:  No discomfort with shoulder, elbow or wrist range of motion, palpable radial pulse, brisk capillary refill, sensation intact light touch.   Right upper extremity:  No obvious or palpable shoulder effusion, no signs of ecchymosis, active shoulder forward flexion to approximately 140 degrees which is similar when compared to the contralateral side, external rotation to 60 degrees and internal rotation to the level of her low to mid back, tenderness to palpation over the pectoralis musculature, clavicle, greater tuberosity and proximal humerus nontender to palpation, Jobe's and Speed's test well-tolerated, negative Hawkins test, equal strength testing internal and external rotation strength, no discomfort with elbow or wrist flexion and extension, palpable radial pulse, brisk capillary refill, sensation intact light touch.      Imaging:  X-rays of her right shoulder were obtained in the office today which revealed no acute fracture or dislocation.  Well-preserved acromioclavicular and glenohumeral joint.      Diagnosis:  Right shoulder pain, likely pectoralis strain.      Assessment/Plan:   All clinical and radiographic findings were reviewed in detail with [name] and her husband today and all of their questions and concerns were answered to their satisfaction.  She will modify her activities and avoid heavy lifting, pushing and pulling.  I recommend she use ice and alternate with heat and take an over-the-counter anti-inflammatory and/or Tylenol as needed for pain.  She will be scheduled for a telemedicine in 2 weeks but will contact the office with any questions or concerns in the interim.  If her symptoms continue to persist recommendation would be for formal physical therapy.      Roosvelt Maser PA-C  [place]

## 2021-05-21 ENCOUNTER — Encounter: Payer: Medicare Other | Admitting: Orthopedic Surgery

## 2021-05-24 ENCOUNTER — Ambulatory Visit (INDEPENDENT_AMBULATORY_CARE_PROVIDER_SITE_OTHER): Payer: Medicare Other

## 2021-05-24 DIAGNOSIS — R55 Syncope and collapse: Secondary | ICD-10-CM

## 2021-05-24 LAB — CUP PACEART REMOTE DEVICE CHECK
Date Time Interrogation Session: 20220703084204
Implantable Pulse Generator Implant Date: 20220531

## 2021-05-25 ENCOUNTER — Other Ambulatory Visit: Payer: Self-pay | Admitting: Primary Care

## 2021-05-27 LAB — GYN CYTOLOGY

## 2021-05-30 ENCOUNTER — Encounter: Payer: Self-pay | Admitting: Physician Assistant

## 2021-06-01 ENCOUNTER — Ambulatory Visit: Payer: Medicare Other | Admitting: Physician Assistant

## 2021-06-06 ENCOUNTER — Other Ambulatory Visit: Payer: Self-pay | Admitting: Primary Care

## 2021-06-08 DIAGNOSIS — I480 Paroxysmal atrial fibrillation: Secondary | ICD-10-CM | POA: Diagnosis not present

## 2021-06-08 DIAGNOSIS — Z8679 Personal history of other diseases of the circulatory system: Secondary | ICD-10-CM | POA: Diagnosis not present

## 2021-06-08 DIAGNOSIS — E785 Hyperlipidemia, unspecified: Secondary | ICD-10-CM | POA: Diagnosis not present

## 2021-06-08 DIAGNOSIS — R55 Syncope and collapse: Secondary | ICD-10-CM | POA: Diagnosis not present

## 2021-06-08 DIAGNOSIS — Z9889 Other specified postprocedural states: Secondary | ICD-10-CM | POA: Diagnosis not present

## 2021-06-08 DIAGNOSIS — I10 Essential (primary) hypertension: Secondary | ICD-10-CM | POA: Diagnosis not present

## 2021-06-09 ENCOUNTER — Ambulatory Visit: Payer: Medicare Other | Attending: Primary Care | Admitting: Primary Care

## 2021-06-09 DIAGNOSIS — N3281 Overactive bladder: Secondary | ICD-10-CM | POA: Insufficient documentation

## 2021-06-09 DIAGNOSIS — E559 Vitamin D deficiency, unspecified: Secondary | ICD-10-CM | POA: Insufficient documentation

## 2021-06-09 DIAGNOSIS — I10 Essential (primary) hypertension: Secondary | ICD-10-CM | POA: Insufficient documentation

## 2021-06-09 DIAGNOSIS — E7800 Pure hypercholesterolemia, unspecified: Secondary | ICD-10-CM | POA: Insufficient documentation

## 2021-06-09 DIAGNOSIS — G40909 Epilepsy, unspecified, not intractable, without status epilepticus: Secondary | ICD-10-CM | POA: Insufficient documentation

## 2021-06-09 DIAGNOSIS — K219 Gastro-esophageal reflux disease without esophagitis: Secondary | ICD-10-CM | POA: Insufficient documentation

## 2021-06-09 MED ORDER — DARIFENACIN HYDROBROMIDE 15 MG PO TB24 *A*
15.0000 mg | ORAL_TABLET | Freq: Every day | ORAL | 0 refills | Status: DC
Start: 2021-06-09 — End: 2021-10-20

## 2021-06-09 MED ORDER — ECONAZOLE NITRATE 1 % EX CREA *I*
TOPICAL_CREAM | Freq: Every day | CUTANEOUS | 1 refills | Status: DC
Start: 2021-06-09 — End: 2023-05-01

## 2021-06-09 MED ORDER — FAMOTIDINE 20 MG PO TABS *I*
20.0000 mg | ORAL_TABLET | Freq: Two times a day (BID) | ORAL | 3 refills | Status: AC
Start: 2021-06-09 — End: ?

## 2021-06-09 NOTE — Progress Notes (Signed)
Video Visit     Location of Patient: home    Location of Telemedicine Provider: hospital / clinical location    Other participants in telemedicine encounter and roles:  husband, Pilar Plate    This is an established patient visit.    Reason for visit: Follow-up (htn, hyperlipidemia, overactive bladder)      HPI  Overactive bladder--The vesicare is $100/month.  Wondering about other options.  Will try sending enablex to the pharmacy to see if that is any cheaper     Would like to try econazole as opposed to lamisil d/t ineffectiveness  Previously had been using ciclopirox, which was also ineffective  She could then resume her statin as well    Osteoporosis--2000 international unit vitamin d ; remains on prolia    HCM--She has seen GYN, dentist, ophtho     GERD--taking aciphex every other day    HTN--continues to tolerate low dose amlodipine and BPs controlled on this regimen    ROS  Denies CP SOB f/c cough    Patient's problem list, allergies, and medications were reviewed and updated as appropriate.  Please see the EHR for full details.    Exam and data reviewed:  No new data    Assessment & Plan:  1. Overactive bladder--formulary and cost has been a barrier to her care.  Will try sending a script for enablex (darifenacin) to her pharmacy  Will also cut down on her liquids after dinner to help manage the nocturia  2.  HTN--well controlled on amlodipine  3. Hyperlipidemia--resume atorvastatin and recheck lipids and LFTs prior to next visit  4. Onychomycosis--ciclopirox and lamisil have been ineffective (she did trial the lamisil for three months with out any improvement)  Sent script for econazole, but pharmacy indicated that this was not on formulary and she needs to trial ketoconazole first, which I sent to [facility] home shipping  5.  GERD--famotidine renewal sent to pharmacy, bid dosing  Continue PPI every other day to limit side effects, esp adverse effects on bone density    Consent was obtained from the patient to  complete this video visit; including the potential for financial liability.          Leeann Must, MD  40 min personally spent on the date of the encounter including face to face pre and post visit work

## 2021-06-14 NOTE — Progress Notes (Signed)
Carelink Summary Report / Loop Recorder 

## 2021-06-16 ENCOUNTER — Ambulatory Visit: Payer: Medicare Other | Admitting: Primary Care

## 2021-06-17 ENCOUNTER — Telehealth: Payer: Self-pay | Admitting: Primary Care

## 2021-06-17 MED ORDER — KETOCONAZOLE 2 % EX CREA *I*
TOPICAL_CREAM | Freq: Two times a day (BID) | CUTANEOUS | 3 refills | Status: DC
Start: 2021-06-17 — End: 2022-01-25

## 2021-06-17 NOTE — Telephone Encounter (Signed)
[name], Pharmacist, from [facility] called advising that the econazole nitrate cream is not covered by the patients insurance. Alternative would be Ketoconazole. Will need a new script for covered medication if still needed.

## 2021-06-17 NOTE — Telephone Encounter (Signed)
Script sent  

## 2021-06-18 ENCOUNTER — Ambulatory Visit
Admission: AD | Admit: 2021-06-18 | Discharge: 2021-06-18 | Disposition: A | Discharge status: Home / Self Care | Payer: Medicare Other | Source: Ambulatory Visit | Attending: Family Medicine | Admitting: Family Medicine

## 2021-06-18 ENCOUNTER — Encounter: Payer: Self-pay | Admitting: Primary Care

## 2021-06-18 DIAGNOSIS — Z20822 Contact with and (suspected) exposure to covid-19: Secondary | ICD-10-CM | POA: Insufficient documentation

## 2021-06-18 DIAGNOSIS — R059 Cough, unspecified: Secondary | ICD-10-CM

## 2021-06-18 DIAGNOSIS — H9203 Otalgia, bilateral: Secondary | ICD-10-CM

## 2021-06-18 DIAGNOSIS — J029 Acute pharyngitis, unspecified: Secondary | ICD-10-CM

## 2021-06-18 DIAGNOSIS — R0981 Nasal congestion: Secondary | ICD-10-CM

## 2021-06-18 DIAGNOSIS — Z20828 Contact with and (suspected) exposure to other viral communicable diseases: Secondary | ICD-10-CM | POA: Insufficient documentation

## 2021-06-18 NOTE — ED Triage Notes (Signed)
Few days Sinus +ear pressure, cong, sore throat, mild cough. Feels feverish/cold intermittently.  Denies NV  Taking routine meds

## 2021-06-18 NOTE — UC Provider Note (Signed)
Chief Complaint:   Chief Complaint   Patient presents with    Cough    Nasal Congestion        HPI:  65 y.o. female w hx as noted in chart presents to Urgent Care with symptoms concerning for COVID 19. Patient complains of 2-3 day(s) of the following symptoms: Sinus pressure, ear pressure, nasal congestion, mild sore throat, mild dry cough, feeling hot and cold    Denies: Fevers, chills, neck pain, chest pain, palpitations, shortness of breath, nausea, vomiting, diarrhea, rash, abdominal pain    Symptoms have remained the same     COVID exposure / other sick contacts: no however husband also has similar symptoms  COVID 19 positive in the last 3 months: no  Fully vaccinated for COVID 19: yes      VITALS:  BP 163/81 (BP Location: Left arm)    Pulse 75    Temp 36.6 C (97.9 F)    Resp 18    SpO2 100%      ROS: See HPI above     PHYSICAL EXAM:  Appearance: Well appearing, no acute distress   Eyes: No conjunctival injection or drainage  Ears: Normal TMs and canals bilaterally  Nose: mucosal edema and congestion   Throat: normal  Neck: no cervical LAD or tenderness, soft  CV: RRR, no murmurs   Pulm: Lungs Clear to ausculation bilaterally, no acute respiratory distress     [facility] LABS:  Labs Reviewed   COVID-19 PCR       No results found for this visit on 06/18/21.     MDM:  65 y.o. female w hx as noted in chart presents with symptoms concerning for COVID-19 vs other mild viral illness. COVID 19 PCR testing ordered (see above if other labs ordered). The patient was informed the COVID 19 result will be available in MyChart and the need for isolation per CDC guidelines. Recommend supportive care at this time. Advised close follow up w PCP as needed. Appropriate Handouts provided.     Given the patient's reassuring vital signs and overall well appearance, the patient can be managed safely at home. Hydration advised. Patient advised to go to ED for any worsening or concerning symptoms.        Astrid Drafts, DO  06/18/21  1905

## 2021-06-18 NOTE — Discharge Instructions (Signed)
PLEASE REVIEW ALL INSTRUCTIONS FOR DETAILS AND CONTENT CONTAINED IN THE DISCHARGE MATERIALS NOT COVERED AT DISCHARGE.    Thank you [name] for coming to [facility] for your health care concerns.    You were seen today for COVID 19 vs other mild viral illness. Reassuring exam and vitals today.     You were tested for COVID, you should isolate until you get your COVID test results. If your test is positive, you will need to continue isolation until cleared by the Department of Health. For more details about this, please see the attached documents.     We will no longer contact patients whose COVID test is positive and have a [facility] MyChart account. Your result will be available to you at the same time it is to [place].    We will not contact any patients whose COVID test is negative. We suggest you sign up for MyChart to see your result. If you do not have a MyChart account, there will be instructions attached.    Supportive care as discussed.     Please monitor for worsening illness, throat pain, drooling, trouble swallowing, fever, chills, shortness of breath/trouble breathing, chest pain.  If you have any concerns or worsening symptoms please call your doctor immediately or return for further evaluation/call 911.    If your condition changes and/or worsens please follow up with your primary doctor and/or return to the urgent care center.    In the event of an emergency please dial 911

## 2021-06-20 LAB — COVID-19 NAAT (PCR): COVID-19 NAAT (PCR): NEGATIVE

## 2021-06-27 ENCOUNTER — Ambulatory Visit (INDEPENDENT_AMBULATORY_CARE_PROVIDER_SITE_OTHER): Payer: Medicare Other

## 2021-06-27 DIAGNOSIS — R55 Syncope and collapse: Secondary | ICD-10-CM | POA: Diagnosis not present

## 2021-06-28 LAB — CUP PACEART REMOTE DEVICE CHECK
Date Time Interrogation Session: 20220805083750
Implantable Pulse Generator Implant Date: 20220531

## 2021-07-12 ENCOUNTER — Other Ambulatory Visit: Payer: Self-pay | Admitting: Primary Care

## 2021-07-12 MED ORDER — LEVOTHYROXINE SODIUM 25 MCG PO TABS *I*
25.0000 ug | ORAL_TABLET | Freq: Every day | ORAL | 3 refills | Status: DC
Start: 2021-07-12 — End: 2022-07-10

## 2021-07-20 ENCOUNTER — Ambulatory Visit
Admission: RE | Admit: 2021-07-20 | Discharge: 2021-07-20 | Disposition: A | Discharge status: Home / Self Care | Payer: Medicare Other | Source: Ambulatory Visit

## 2021-07-20 ENCOUNTER — Encounter: Payer: Self-pay | Admitting: Orthopedic Surgery

## 2021-07-20 ENCOUNTER — Ambulatory Visit: Payer: Medicare Other | Attending: Physician Assistant | Admitting: Physician Assistant

## 2021-07-20 VITALS — BP 181/86 | HR 79 | Ht 63.0 in | Wt 105.0 lb

## 2021-07-20 DIAGNOSIS — S60212A Contusion of left wrist, initial encounter: Secondary | ICD-10-CM | POA: Insufficient documentation

## 2021-07-20 DIAGNOSIS — Z9889 Other specified postprocedural states: Secondary | ICD-10-CM | POA: Insufficient documentation

## 2021-07-20 DIAGNOSIS — M810 Age-related osteoporosis without current pathological fracture: Secondary | ICD-10-CM | POA: Insufficient documentation

## 2021-07-20 DIAGNOSIS — M25532 Pain in left wrist: Secondary | ICD-10-CM

## 2021-07-20 DIAGNOSIS — M79642 Pain in left hand: Secondary | ICD-10-CM

## 2021-07-20 DIAGNOSIS — M19042 Primary osteoarthritis, left hand: Secondary | ICD-10-CM

## 2021-07-20 DIAGNOSIS — S52602D Unspecified fracture of lower end of left ulna, subsequent encounter for closed fracture with routine healing: Secondary | ICD-10-CM

## 2021-07-20 DIAGNOSIS — S52502D Unspecified fracture of the lower end of left radius, subsequent encounter for closed fracture with routine healing: Secondary | ICD-10-CM

## 2021-07-20 NOTE — Progress Notes (Signed)
Chief Complaint: Left wrist/hand pain    Attending Physician: Dr. Evelena Asa     History of Present Illness: [name] is a 65 y.o. female with a past medical history significant for compound both bone left wrist fracture from 2011 (status post ORIF of the distal radius, distal ulna with Dr. Berkley Harvey), osteoporosis (on Prolia), seizure, prior stroke (not currently on any anticoagulation therapy) who is right-hand dominant who presents accompanied by her husband in regards to a left hand/wrist injury that occurred earlier this morning.  Patient states she simply "lost her balance" while turning quickly in the kitchen today.  She states she subsequently fell onto an outstretched left upper extremity.  She presents today due to continued pain.  Denies numbness or tingling.  Denies swelling.  Denies ecchymosis.  She does note some mild left shoulder pain since the fall.  She believes she did hit her head during the fall.  She denies loss of consciousness.  Denies vomiting, headache, blurred vision.  She did see her chiropractor today, no manipulations were performed but she did notice improvement with heat and gentle massage.  Patient ambulates at baseline without the assistance of a walker or cane.  She has been taking Advil, icing with some improvement in pain    Information supplemental to this note including Past Medical/Surgical History, Medications, Allergies, Social History, Family History, 10-point Review of Systems is recorded in the annotated patient questionnaire of this date, and was personally reviewed by myself. This is located in the patient's chart.    Physical Exam:   General: Pleasant-appearing female appearing stated age. In no acute distress.   Left upper extremity: Prior surgical incision noted over the volar aspect of the wrist, this appears benign in nature.  Ring present to the left ring finger.  No evidence of swelling or ecchymosis.  Full pain-free range of motion to the cervical spine.  No  ecchymosis to the proximal humerus.  Full pain-free range of motion to the left shoulder.  Full pain range of motion to the elbow.  Mild pain to palpation to the mid radial diaphysis, basal joint.  No anatomical snuffbox tenderness.  No ulnar-sided wrist pain.  She is able to gently flex and extend the wrist with minimal discomfort, limited supination of the forearm per patient's baseline.  No pain to palpation about the remainder of the hand.  Able gently flex and extend at the IP joint without pain.  Able oppose the thumb to all 4 digits.  Able make a complete fist without rotational deformity, able to fully extend all digits..  Hand is warm well perfused, sensation intact light touch distally.  Left ring is removed with a ring cutter prior to radiographs today    Estimated body mass index is 18.6 kg/m as calculated from the following:    Height as of this encounter: 1.6 m ('5\' 3"'$ ).    Weight as of this encounter: 47.6 kg (105 lb).    Imaging: Radiographs of the left wrist, left hand from 07/20/2021 were personally and independently reviewed by myself. They reveal no evidence of acute osseous abnormalities.  Left wrist demonstrates status post distal radius, distal ulna ORIF.  Orthopedic hardware remains intact when compared to previous images of the wrist from.         Assessment: Follows an outstretched left upper extremity in a patient that is status post left distal radius and distal ulna ORIF.  No appreciated orthopedic hardware failure, periprosthetic fracture.  Remnants of ring removal  present over ring finger and radiographs today    Plan: Findings presented to the patient and her husband.  She was provided with an volar wrist brace to wear with activity for additional support and comfort.  Told remove this several times a day for elevation, icing, gentle range of motion.  Encouraged use of oral anti-inflammatories with food as needed for pain relief.  Message sent to Dr. Berkley Harvey to inform him of the  patient's presentation to evening urgent care.  Plan to reevaluate in 3 weeks time for repeat clinical exam of the left wrist. Patient and husband are in agreement with this treatment plan. All questions were answered and addressed. Patient was informed to contact the office with increasing difficulties, questions, or concerns.     Follow-Up: 3 weeks for repeat clinical exam of the left hand and wrist    Karsten Fells, PA      Note is dictated using Sales executive. Please excuse any grammatical errors as every effort has been made to proof-read this note. '

## 2021-07-21 NOTE — Progress Notes (Signed)
Carelink Summary Report / Loop Recorder 

## 2021-08-01 ENCOUNTER — Ambulatory Visit (INDEPENDENT_AMBULATORY_CARE_PROVIDER_SITE_OTHER): Payer: Medicare Other

## 2021-08-01 DIAGNOSIS — R55 Syncope and collapse: Secondary | ICD-10-CM

## 2021-08-03 ENCOUNTER — Telehealth: Payer: Self-pay | Admitting: Interventional Cardiology

## 2021-08-03 LAB — CUP PACEART REMOTE DEVICE CHECK
Date Time Interrogation Session: 20220907083900
Implantable Pulse Generator Implant Date: 20220531

## 2021-08-03 MED ORDER — METOPROLOL SUCCINATE ER 25 MG PO TB24: ORAL_TABLET | ORAL | 2 refills | Status: DC

## 2021-08-03 NOTE — Telephone Encounter (Signed)
Pt's medication was sent to pt's pharmacy as requested. Confirmation received.  °

## 2021-08-03 NOTE — Telephone Encounter (Signed)
*  STAT* If patient is at the pharmacy, call can be transferred to refill team.   1. Which medications need to be refilled? (please list name of each medication and dose if known)  metoprolol succinate (TOPROL-XL) 25 MG 24 hr tablet  2. Which pharmacy/location (including street and city if local pharmacy) is medication to be sent to? [facility] [phone] - [place], [place] - [address] AT [address]  3. Do they need a 30 day or 90 day supply?  90 day supply

## 2021-08-03 NOTE — Addendum Note (Signed)
Addended by: Carter Kitten D on: 08/03/2021 10:37 AM   Modules accepted: Orders

## 2021-08-09 NOTE — Progress Notes (Signed)
Carelink Summary Report / Loop Recorder 

## 2021-08-10 ENCOUNTER — Ambulatory Visit: Payer: Medicare Other | Admitting: Orthopedic Surgery

## 2021-08-10 ENCOUNTER — Ambulatory Visit: Payer: Medicare Other | Admitting: Physician Assistant

## 2021-08-10 VITALS — BP 147/82 | HR 75

## 2021-08-10 DIAGNOSIS — S60212A Contusion of left wrist, initial encounter: Secondary | ICD-10-CM

## 2021-08-11 NOTE — Progress Notes (Signed)
FOLLOW UP VISIT:   [name]     NEXT VISIT:  As needed    IMPRESSION/PLAN   Left wrist and forearm contusion continue with conservative treatment    HISTORY: 65 y.o. patient.  Patient is status post a fall onto her arm and left wrist.  Had x-rays which are personally reviewed and show no obvious fracture she is making steady progress.    Labs:  No results found for: HA1C  No results for input(s): INR in the last 8760 hours.      PAST MEDICAL, Social, Family HISTORY & Review of systems  Histories and ten systems are reviewed and negative except as documented in the record which I have reviewed and agree with and discussed the pertinent positives and negatives with the patient.  PHYSICAL EXAM  Vital Signs: BP 147/82    Pulse 75   General Appearance:  Normal body habitus. Alert and oriented to person, place, and time, Affect:  Normal, Skin- Normal  Good range of motion is noted slight tenderness over the DRUJ region is noted near full range of motion neurovascular exams grossly intact  Answers for HPI/ROS submitted by the patient on 08/05/2021  What is your goal for today's visit?: Follow up appointment  Handedness: Right Handed  Date of onset: : 07/20/2021  Was this the result of an injury?: Yes  What is your pain level?: 5/10  Please describe the quality of your pain: : aching, discomfort, dull ache, locking, sore, stiffness, tenderness  What diagnostic workup have you had for this condition?: X-ray  What treatments have you tried for this condition?: acetaminophen, activity modification, bracing, immobilization  Progression since onset: : gradually improving  Is this a work related condition? : No  Current work status: : usual activities  Fever: No  Chills: No  Numbness: No  Tingling: No      Answers for HPI/ROS submitted by the patient on 08/05/2021  What is your goal for today's visit?: Follow up appointment  Handedness: Right Handed  Date of onset: : 07/20/2021  Was this the result of an injury?: Yes  What is  your pain level?: 5/10  Please describe the quality of your pain: : aching, discomfort, dull ache, locking, sore, stiffness, tenderness  What diagnostic workup have you had for this condition?: X-ray  What treatments have you tried for this condition?: acetaminophen, activity modification, bracing, immobilization  Progression since onset: : gradually improving  Is this a work related condition? : No  Current work status: : usual activities  Fever: No  Chills: No  Numbness: No  Tingling: No

## 2021-08-17 ENCOUNTER — Other Ambulatory Visit: Payer: Self-pay

## 2021-08-17 ENCOUNTER — Encounter: Payer: Self-pay | Admitting: Primary Care

## 2021-08-17 ENCOUNTER — Ambulatory Visit: Payer: Medicare Other | Attending: Primary Care | Admitting: Primary Care

## 2021-08-17 VITALS — BP 142/90 | HR 68 | Ht 62.99 in | Wt 107.4 lb

## 2021-08-17 DIAGNOSIS — Z23 Encounter for immunization: Secondary | ICD-10-CM | POA: Insufficient documentation

## 2021-08-17 DIAGNOSIS — I10 Essential (primary) hypertension: Secondary | ICD-10-CM | POA: Insufficient documentation

## 2021-08-17 DIAGNOSIS — E559 Vitamin D deficiency, unspecified: Secondary | ICD-10-CM | POA: Insufficient documentation

## 2021-08-17 DIAGNOSIS — E034 Atrophy of thyroid (acquired): Secondary | ICD-10-CM | POA: Insufficient documentation

## 2021-08-17 DIAGNOSIS — G40909 Epilepsy, unspecified, not intractable, without status epilepticus: Secondary | ICD-10-CM | POA: Insufficient documentation

## 2021-08-17 DIAGNOSIS — E7800 Pure hypercholesterolemia, unspecified: Secondary | ICD-10-CM

## 2021-08-17 DIAGNOSIS — Z0000 Encounter for general adult medical examination without abnormal findings: Secondary | ICD-10-CM | POA: Insufficient documentation

## 2021-08-17 LAB — PCMH FALL RISK ASSESSMENT

## 2021-08-17 LAB — PCMH DEPRESSION ASSESSMENT

## 2021-08-17 LAB — PCMH FALL RISK PLAN

## 2021-08-17 MED ORDER — AMLODIPINE BESYLATE 5 MG PO TABS *I*
5.0000 mg | ORAL_TABLET | Freq: Every day | ORAL | 3 refills | Status: DC
Start: 2021-08-17 — End: 2022-11-03

## 2021-08-17 NOTE — Progress Notes (Signed)
Reason for Visit: Subsequent Annual Medicare Visit and Follow-up      Saw [facility] left hand/wrist after her fall  Luckily there was no sign of fracture  They cut off her wedding ring on account of soft tissue swelling  She saw Dr. Berkley Harvey in follow up  She couldn't tie her shoes for some time, which was a nuisance, but she is able to again now, foot is     Blood pressure is somewhat elevated today.  She is agreeable to an increase in her amlodipine for stroke risk prevention.    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  This was a difficult decision and a tough life transition but we mutually decided that it is the most appropriate choice    GERD is stable on lowest effective dose of proton pump inhibitor.  Continues to tolerate atorvastatin for hyperlipidemia and stroke risk reduction  Remains on levothyroxine for hypothyroidism  Continues to follow-up regularly with neurology regarding history of AV malformation and seizure disorder    Remains on Prolia for fracture risk reduction.  Continues to take calcium and vitamin D.  Patient Active Problem List   Diagnosis Code    Reported Trauma Neck     Allergic rhinitis J30.9    Irritable bowel syndrome K58.9    Essential hypertension I10    Dysplastic Nevus I78.1    Rosacea L71.9    Osteoporosis M81.0    Hypothyroidism E03.9    Scoliosis M41.20    Urge Incontinence Of Urine N39.41    A-V Malformation Repair Supratentorial Complex     Seizure disorder G40.909    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    Ringing In The Ears (Tinnitus) H93.19    GERD (gastroesophageal reflux disease) K21.9    Pain in joint, ankle and foot M25.579    Weakness of foot R29.898    Hyperlipidemia E78.5     Current Outpatient Medications   Medication    levothyroxine (SYNTHROID, LEVOTHROID) 25 mcg tablet    econazole nitrate (SPECTAZOLE) 1 % cream    famotidine (PEPCID) 20 mg tablet    solifenacin (VESICARE) 10 mg tablet     azelastine (ASTEPRO) 137 MCG/SPRAY SOLN nasal solution    RABEprazole (ACIPHEX) 20 MG tablet    [employer] 12 HOUR NASAL DECONGESTANT 120 MG 12 hr tablet    atorvastatin (LIPITOR) 10 mg tablet    ciclopirox (PENLAC) 8 % solution    carBAMazepine (CARBATROL) 100 mg 12 hr capsule    carBAMazepine (TEGRETOL XR) 200 mg 12 hr tablet    triamcinolone (KENALOG) 0.1 % ointment    beclomethasone (QNASL) 80 MCG/ACT nasal spray    fluticasone (FLOVENT DISKUS) 250 MCG/BLIST diskus inhaler    generic DME    Non-System Medication    Calcium Carb-Cholecalciferol 600-200 MG-UNIT TABS    Denosumab (PROLIA SC)    loratadine (CLARITIN) 10 MG tablet    Multiple Vitamin (MULTIVITAMIN) per tablet    cholecalciferol (VITAMIN D) 1000 UNITS capsule    amLODIPine (NORVASC) 5 mg tablet    ketoconazole (NIZORAL) 2 % cream    darifenacin (ENABLEX) 15 mg 24 hr tablet    pseudoephedrine (SUDAFED) 30 mg tablet    EPINEPHrine (EPIPEN) 0.3 mg/0.3 mL auto-injector     No current facility-administered medications for this visit.       Medication list reviewed and updated, no changes were made today    Exam: BP 142/90  Pulse 68    Ht 1.6 m (5' 2.99")    Wt 48.7 kg (107 lb 6.4 oz)    BMI 19.03 kg/m   Well-nourished alert and conversant 65 year old lady in no acute distress.  Neck is supple without lymphadenopathy.  Lungs are clear to auscultation bilaterally.  No wheezes rales or rhonchi.  Cardiac exam reveals a normal S1-S2 with a regular rate and rhythm.  Extremities are without edema.    A/P:  1.  Hypertension- both blood pressure readings today were higher than ideal.  She was agreeable to an increase in her amlodipine to 5 mg daily.  Plan on follow-up visit in 3 months to reassess.  She will check home readings in the meantime.  2.  Hyperlipidemia- continue atorvastatin, lab slip added for next visit.  3.  Hypothyroidism-continue levothyroxine, check TSH prior to next visit  4.  Osteoporosis- reassuring that the most recent  fall did not result in the fracture.  Continue Prolia, calcium, vitamin D and regular weightbearing exercise  Half of her high-dose flu shot was administered today with plans for repeat in 2 weeks as per her previous history   Agree with her pursuing COVID-19 by valent booster at the pharmacy    45 min personally spent on the date of the encounter including face to facce pre and post visit work

## 2021-08-17 NOTE — Patient Instructions (Signed)
Thank you for completing your Subsequent Annual Medicare Visit and Follow-up   with [place] today.     The purpose of this visits was to:     Screen for disease   Assess risk of future medical problems   Help develop a healthy lifestyle   Update vaccines   Get to know your doctor in case of an illness    Patient Care Team:  [employer] (Inactive) as PCP - CCP-AUDIOLOGY  Leeann Must, MD as PCP - General  Tyrell Antonio Darrel Reach, AUD as PCP - CCP-AUDIOLOGY     Medicare 5 Year Plan    The following items were identified as areas of concern during your screening today:  High Blood Pressure (Hypertension) - This is a risk factor for Heart Attack, Stroke, Kidney Problems and Eye Problems.       The Health Maintenance table below identifies screening tests and immunizations recommended by your health care team:  Health Maintenance: These screening recommendations are based on USPSTF, [employer], and [place] state guidelines   Topic Date Due    HIV Screening  Never done    Shingles Vaccine (2 of 3) 03/27/2014    Adult Prevnar Vaccination (1) Never done    Adult Pneumovax Vaccine (1) Never done    COVID-19 Vaccine (5 - Booster for Moderna series) 05/14/2021    Flu Shot (1) 07/21/2021    Breast Cancer Screening  10/21/2021    Osteoporosis Screening  03/10/2022    Colon Cancer Screening  07/12/2022    DEPRESSION SCREEN YEARLY  08/17/2022    Fall Risk Screening  08/17/2022    Cervical Cancer Screening  05/18/2024    Hepatitis C Screening  Completed     In addition, goals and orders placed to address these recommendations are listed in the "Today's Visit" section.    We wish you the best of health and look forward to seeing you again next year for your Annual Medicare Wellness Visit.     If you have any health care concerns before then, please do not hesitate to contact us.

## 2021-08-18 ENCOUNTER — Other Ambulatory Visit
Admission: RE | Admit: 2021-08-18 | Discharge: 2021-08-18 | Disposition: A | Discharge status: Home / Self Care | Payer: Medicare Other | Source: Ambulatory Visit

## 2021-08-18 ENCOUNTER — Other Ambulatory Visit: Payer: Self-pay | Admitting: Gastroenterology

## 2021-08-18 DIAGNOSIS — G40909 Epilepsy, unspecified, not intractable, without status epilepticus: Secondary | ICD-10-CM

## 2021-08-18 DIAGNOSIS — E034 Atrophy of thyroid (acquired): Secondary | ICD-10-CM

## 2021-08-18 DIAGNOSIS — E7800 Pure hypercholesterolemia, unspecified: Secondary | ICD-10-CM

## 2021-08-18 DIAGNOSIS — E559 Vitamin D deficiency, unspecified: Secondary | ICD-10-CM

## 2021-08-21 ENCOUNTER — Encounter: Payer: Self-pay | Admitting: Primary Care

## 2021-09-02 ENCOUNTER — Ambulatory Visit: Payer: Medicare Other | Attending: Primary Care

## 2021-09-02 ENCOUNTER — Other Ambulatory Visit: Payer: Self-pay

## 2021-09-02 DIAGNOSIS — Z23 Encounter for immunization: Secondary | ICD-10-CM | POA: Insufficient documentation

## 2021-09-02 NOTE — Progress Notes (Signed)
Patient given second half of High Dose Flu Vaccine. 0.66ml of High Dose Flu Vaccine given IM in left deltoid. Patient tolerated well and possible side effects reviewed. Flu vaccine given in 2 doses per patient's request.

## 2021-09-05 ENCOUNTER — Ambulatory Visit (INDEPENDENT_AMBULATORY_CARE_PROVIDER_SITE_OTHER): Payer: Medicare Other

## 2021-09-05 DIAGNOSIS — R55 Syncope and collapse: Secondary | ICD-10-CM | POA: Diagnosis not present

## 2021-09-06 ENCOUNTER — Ambulatory Visit: Payer: Medicare Other | Attending: Neurology | Admitting: Neurology

## 2021-09-06 ENCOUNTER — Other Ambulatory Visit: Payer: Self-pay

## 2021-09-06 ENCOUNTER — Encounter: Payer: Self-pay | Admitting: Neurology

## 2021-09-06 VITALS — BP 142/66 | HR 73 | Temp 97.8°F

## 2021-09-06 DIAGNOSIS — R569 Unspecified convulsions: Secondary | ICD-10-CM | POA: Insufficient documentation

## 2021-09-06 DIAGNOSIS — I69398 Other sequelae of cerebral infarction: Secondary | ICD-10-CM | POA: Insufficient documentation

## 2021-09-06 LAB — CUP PACEART REMOTE DEVICE CHECK
Date Time Interrogation Session: 20221010084019
Implantable Pulse Generator Implant Date: 20220531

## 2021-09-06 NOTE — Progress Notes (Signed)
I the pleasure of seeing her today along with her husband for follow-up of her history of an arteriovenous malformation and residual seizure disorder.  I last visit with her was a telemetry medicine visit in April.    Overall she has been doing relatively well since that time.  She continues to work about 12 or 15 hours/week remotely.  She finds this amount to be a good balance between keeping busy and not overdoing it.  She and her husband continue to maintain fairly strict COVID precautions.  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  This change of course has been a difficult 1.    She has had no recurrent seizures.  She did have a fall recently where she injured her left hand, which is the same wrist she fractured several years ago and required surgical repair.  There was no sign of any fracture and it seemed to be just soft tissue injury.  She seems to have recovered as well.    Review of systems is otherwise noncontributory.    Examination:  Vitals:    09/06/21 0937   BP: 142/66   Pulse: 73   Temp: 36.6 C (97.8 F)     She is alert and oriented.  Her speech is clear.  Comprehension is intact.    Cranial nerves visual fields are full to confrontation.  Versions are full without nystagmus.  Motor examination revealed full strength throughout there was no clear pronator drift.  She continues to have a mild sustention tremor, perhaps slightly worse on the left than the right today.  It does interfere with her ability to type or use silverware but it does not change significantly over the last 6 to 12 months.    Impression: History of an AVM status post resection with residual seizures, currently stable.  She has been well maintained on her current dose of Tegretol which is 500 mg twice daily.  Levels have always been stable but she will have those checked again at the time of her next blood draw.    I note that her blood pressure  has been slightly elevated.  I have asked her to record her blood pressure over the next several weeks and report those to her primary care physician who may make additional adjustments in her blood pressure medication.  I plan on seeing her again in 6 months.

## 2021-09-06 NOTE — Patient Instructions (Signed)
Please reach out to the clinic with any additional questions or concerns. We encourage you to sign up and use MyChart for any non-urgent medical questions as MyChart is the preferred method of communication.  Please contact your pharmacy for medication refills and allow up to 2 business days for routine prescription refills.  For urgent messages, please call the clinic at [phone].  The clinic is open from 8am to 4:30pm Monday through Friday.    Overall stable  No change in carbamazepine  Will follow levels at time of next blood draw    BP slightly on the high side; will encourage periodic BP readings at home and report to PCP/me    Follow up 6 months

## 2021-09-07 ENCOUNTER — Encounter: Payer: Self-pay | Admitting: Internal Medicine

## 2021-09-07 DIAGNOSIS — H43812 Vitreous degeneration, left eye: Secondary | ICD-10-CM | POA: Diagnosis not present

## 2021-09-07 DIAGNOSIS — H2513 Age-related nuclear cataract, bilateral: Secondary | ICD-10-CM | POA: Diagnosis not present

## 2021-09-07 DIAGNOSIS — H52203 Unspecified astigmatism, bilateral: Secondary | ICD-10-CM | POA: Diagnosis not present

## 2021-09-07 DIAGNOSIS — H5213 Myopia, bilateral: Secondary | ICD-10-CM | POA: Diagnosis not present

## 2021-09-07 NOTE — Progress Notes (Signed)
Subjective:    Patient ID: Ariel Simpson, female    DOB: 09/14/1956, 65 y.o.   MRN: [id number]   This visit occurred during the SARS-CoV-2 public health emergency.  Safety protocols were in place, including screening questions prior to the visit, additional usage of staff PPE, and extensive cleaning of exam room while observing appropriate contact time as indicated for disinfecting solutions.    HPI She is here for a physical exam.   She is doing good.  She has no concerns.    Medications and allergies reviewed with patient and updated if appropriate.  Patient Active Problem List   Diagnosis Date Noted   Dizziness 01/13/2021   Syncope 01/13/2021   Hypotension 09/06/2020   Personal history of colonic polyps 08/26/2019   Osteopenia 09/23/2016   Need for prophylactic antibiotic 09/23/2016   Tremor, benign 08/08/2016   Hyperlipidemia 08/08/2016   Insomnia 08/08/2016   S/P mitral valve repair 03/18/2013   S/P Maze operation for atrial fibrillation 03/18/2013   Paroxysmal atrial fibrillation ([place]) 03/11/2013   History of PSVT (paroxysmal supraventricular tachycardia) 03/03/2013    Current Outpatient Medications on File Prior to Visit  Medication Sig Dispense Refill   amoxicillin (AMOXIL) 500 MG capsule TAKE 4 CAPSULES BY MOUTH 1 HOUR BEFORE DENTAL APPOINTMENT 4 capsule 3   aspirin 81 MG tablet Take 1 tablet (81 mg total) by mouth daily.     atorvastatin (LIPITOR) 20 MG tablet TAKE 1 TABLET BY MOUTH DAILY 90 tablet 1   Calcium Carbonate-Vitamin D (CALCIUM + D PO) Take 1 capsule by mouth daily.      Melatonin 10 MG TABS Take by mouth.     metoprolol succinate (TOPROL-XL) 25 MG 24 hr tablet TAKE 1 TABLET(25 MG) BY MOUTH IN THE MORNING AND AT BEDTIME WITH OR IMMEDIATELY FOLLOWING A MEAL 180 tablet 2   Misc Natural Products (GLUCOSAMINE CHOND COMPLEX/MSM PO) Take 1 tablet by mouth daily.     Omega-3 Fatty Acids (FISH OIL PO) Take 2 capsules by mouth daily.     polyethylene  glycol (MIRALAX / GLYCOLAX) packet Take 17 g by mouth daily.     TURMERIC PO Take 1 tablet by mouth daily.      zolpidem (AMBIEN) 5 MG tablet Take 0.5 tablets (2.5 mg total) by mouth at bedtime as needed for sleep. 45 tablet 0   No current facility-administered medications on file prior to visit.    Past Medical History:  Diagnosis Date   Arthritis    Benign essential tremor    CHF (congestive heart failure) (HCC)    Heart murmur    High cholesterol    Hypertension    Insomnia    Mitral regurgitation    Mitral valve prolapse    Osteopenia    Paroxysmal atrial fibrillation (HCC) 03/11/2013   Paroxysmal supraventricular tachycardia (HCC)    PONV (postoperative nausea and vomiting)    Dizziness and Nausea   S/P Maze operation for atrial fibrillation 03/18/2013   Complete bilateral atrial lesion set using cryothermy and biopolar radiofrequency ablation with oversewing of LA appendage via right mini thoracotomy   S/P mitral valve repair 03/18/2013   Complex valvuloplasty including triangular resection of posterior leaflet, artificial Gore-tex neocord placement x4 and 2mm Sorin Memo 3D ring annuloplasty via right mini thoracotomy   Severe mitral regurgitation 03/03/2013   Acute on chronic with acute heart failure    Shortness of breath    Wears glasses     Past Surgical History:  Procedure Laterality Date   ABDOMINAL HYSTERECTOMY     hernia repair   BIOPSY  08/26/2019   Procedure: BIOPSY;  Surgeon: Wilford Corner, MD;  Location: [facility];  Service: Endoscopy;;   CARDIAC CATHETERIZATION     COLONOSCOPY     COLONOSCOPY WITH PROPOFOL N/A 08/26/2019   Procedure: COLONOSCOPY WITH PROPOFOL;  Surgeon: Wilford Corner, MD;  Location: [facility];  Service: Endoscopy;  Laterality: N/A;   INTRAOPERATIVE TRANSESOPHAGEAL ECHOCARDIOGRAM N/A 03/18/2013   Procedure: INTRAOPERATIVE TRANSESOPHAGEAL ECHOCARDIOGRAM;  Surgeon: Rexene Alberts, MD;  Location: [place];  Service: Open Heart Surgery;   Laterality: N/A;   LEFT AND RIGHT HEART CATHETERIZATION WITH CORONARY ANGIOGRAM N/A 03/06/2013   Procedure: LEFT AND RIGHT HEART CATHETERIZATION WITH CORONARY ANGIOGRAM;  Surgeon: Sinclair Grooms, MD;  Location: [facility] CATH LAB;  Service: Cardiovascular;  Laterality: N/A;   MAZE N/A 03/18/2013   Procedure: MAZE;  Surgeon: Rexene Alberts, MD;  Location: [place];  Service: Open Heart Surgery;  Laterality: N/A;   MITRAL VALVE REPAIR Right 03/18/2013   Procedure: MINIMALLY INVASIVE MITRAL VALVE REPAIR (MVR);  Surgeon: Rexene Alberts, MD;  Location: [place];  Service: Open Heart Surgery;  Laterality: Right;   VARICOSE VEIN SURGERY     WISDOM TOOTH EXTRACTION      Social History   Socioeconomic History   Marital status: Married    Spouse name: Fritz Pickerel   Number of children: 3   Years of education: BS   Highest education level: Not on file  Occupational History    Employer: [employer]  Tobacco Use   Smoking status: Never   Smokeless tobacco: Never  Vaping Use   Vaping Use: Never used  Substance and Sexual Activity   Alcohol use: Yes    Alcohol/week: 5.0 - 6.0 standard drinks    Types: 5 - 6 Glasses of wine per week    Comment: 1 drink/day   Drug use: No   Sexual activity: Yes    Birth control/protection: Surgical  Other Topics Concern   Not on file  Social History Narrative   Patient lives at home with her husband  Fritz Pickerel)  - second husband. Patient has college education B.S. 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   Caffeine- one cup daily.   Social Determinants of Health   Financial Resource Strain: Low Risk    Difficulty of Paying Living Expenses: Not hard at all  Food Insecurity: No Food Insecurity   Worried About Charity fundraiser in the Last Year: Never true   [place] in the Last Year: Never true  Transportation Needs: No Transportation Needs   Lack of Transportation (Medical): No   Lack of Transportation (Non-Medical): No   Physical Activity: Sufficiently Active   Days of Exercise per Week: 7 days   Minutes of Exercise per Session: 60 min  Stress: Stress Concern Present   Feeling of Stress : Very much  Social Connections: Unknown   Frequency of Communication with Friends and Family: More than three times a week   Frequency of Social Gatherings with Friends and Family: Once a week   Attends Religious Services: Patient refused   Marine scientist or Organizations: Patient refused   Attends Archivist Meetings: Patient refused   Marital Status: Married    Family History  Problem Relation Age of Onset   Pancreatic cancer Mother    Stomach cancer Father  Hypertension Father    Thyroid cancer Sister    Colon cancer Paternal Grandmother    Stomach cancer Paternal Grandfather    Seizures Child    Down syndrome Child     Review of Systems  Constitutional:  Negative for chills and fever.  HENT:  Positive for tinnitus.        ETD  Eyes:  Negative for visual disturbance.  Respiratory:  Negative for cough, shortness of breath and wheezing.   Cardiovascular:  Positive for palpitations. Negative for chest pain and leg swelling.  Gastrointestinal:  Negative for abdominal pain, blood in stool, constipation, diarrhea and nausea.       No gerd  Genitourinary:  Negative for dysuria.  Musculoskeletal:  Positive for arthralgias. Negative for back pain.  Skin:  Negative for color change and rash.  Neurological:  Negative for dizziness, light-headedness and headaches.  Psychiatric/Behavioral:  Negative for dysphoric mood. The patient is not nervous/anxious.       Objective:   Vitals:   09/08/21 0839  BP: (!) 142/88  Pulse: (!) 59  Temp: 98 F (36.7 C)  SpO2: 98%   Filed Weights   09/08/21 0839  Weight: 165 lb 3.2 oz (74.9 kg)   Body mass index is 25.87 kg/m.  BP Readings from Last 3 Encounters:  09/08/21 (!) 142/88  04/19/21 (!) 146/84  03/09/21 130/90    Wt Readings from Last  3 Encounters:  09/08/21 165 lb 3.2 oz (74.9 kg)  04/19/21 162 lb 6.4 oz (73.7 kg)  03/09/21 160 lb 12.8 oz (72.9 kg)     Physical Exam Constitutional: She appears well-developed and well-nourished. No distress.  HENT:  Head: Normocephalic and atraumatic.  Right Ear: External ear normal. Normal ear canal and TM Left Ear: External ear normal.  Normal ear canal and TM Mouth/Throat: Oropharynx is clear and moist.  Eyes: Conjunctivae and EOM are normal.  Neck: Neck supple. No tracheal deviation present. No thyromegaly present.  No carotid bruit  Cardiovascular: Normal rate, regular rhythm and normal heart sounds.   2/6 sys murmur heard.  No edema. Pulmonary/Chest: Effort normal and breath sounds normal. No respiratory distress. She has no wheezes. She has no rales.  Breast: deferred   Abdominal: Soft. She exhibits no distension. There is no tenderness.  Lymphadenopathy: She has no cervical adenopathy.  Skin: Skin is warm and dry. She is not diaphoretic.  Psychiatric: She has a normal mood and affect. Her behavior is normal.     Lab Results  Component Value Date   WBC 4.3 09/06/2020   HGB 13.1 09/06/2020   HCT 38.6 09/06/2020   PLT 236.0 09/06/2020   GLUCOSE 90 09/06/2020   CHOL 144 09/06/2020   TRIG 95.0 09/06/2020   HDL 51.50 09/06/2020   LDLCALC 73 09/06/2020   ALT 16 09/06/2020   AST 18 09/06/2020   NA 141 09/06/2020   K 4.9 09/06/2020   CL 106 09/06/2020   CREATININE 0.83 09/06/2020   BUN 10 09/06/2020   CO2 28 09/06/2020   TSH 2.06 09/06/2020   INR 1.29 03/23/2013   HGBA1C 5.5 02/19/2018         Assessment & Plan:   Physical exam: Screening blood work  ordered Exercise  yoga, some walking  Weight  good for age Substance abuse  none Derm annually  Reviewed recommended immunizations.   Health Maintenance  Topic Date Due   Pneumonia Vaccine 71+ Years old (1 - PCV) Never done   COVID-19 Vaccine (4 -  Booster for [employer] series) 09/24/2021 (Originally  10/19/2020)   Zoster Vaccines- Shingrix (1 of 2) 12/09/2021 (Originally 12/05/2003)   DEXA SCAN  02/12/2022   TETANUS/TDAP  04/08/2022   MAMMOGRAM  02/19/2023   COLONOSCOPY (Pts 45-4yrs Insurance coverage will need to be confirmed)  08/25/2024   INFLUENZA VACCINE  Completed   Hepatitis C Screening  Completed   HPV VACCINES  Aged Out          See Problem List for Assessment and Plan of chronic medical problems.   CPE in 1 year

## 2021-09-07 NOTE — Patient Instructions (Addendum)
Blood work was ordered.     Medications changes include :   try an otc steroid nasal sprays ( flonase/nasacort/nasonex) and an oral anti-histamine like claritin or zyrtec  Your prescription(s) have been submitted to your pharmacy. Please take as directed and contact our office if you believe you are having problem(s) with the medication(s).   Please followup in 1 year   Health Maintenance, Female Adopting a healthy lifestyle and getting preventive care are important in promoting health and wellness. Ask your health care provider about: The right schedule for you to have regular tests and exams. Things you can do on your own to prevent diseases and keep yourself healthy. What should I know about diet, weight, and exercise? Eat a healthy diet  Eat a diet that includes plenty of vegetables, fruits, low-fat dairy products, and lean protein. Do not eat a lot of foods that are high in solid fats, added sugars, or sodium. Maintain a healthy weight Body mass index (BMI) is used to identify weight problems. It estimates body fat based on height and weight. Your health care provider can help determine your BMI and help you achieve or maintain a healthy weight. Get regular exercise Get regular exercise. This is one of the most important things you can do for your health. Most adults should: Exercise for at least 150 minutes each week. The exercise should increase your heart rate and make you sweat (moderate-intensity exercise). Do strengthening exercises at least twice a week. This is in addition to the moderate-intensity exercise. Spend less time sitting. Even light physical activity can be beneficial. Watch cholesterol and blood lipids Have your blood tested for lipids and cholesterol at 67 years of age, then have this test every 5 years. Have your cholesterol levels checked more often if: Your lipid or cholesterol levels are high. You are older than 67 years of age. You are at high risk for  heart disease. What should I know about cancer screening? Depending on your health history and family history, you may need to have cancer screening at various ages. This may include screening for: Breast cancer. Cervical cancer. Colorectal cancer. Skin cancer. Lung cancer. What should I know about heart disease, diabetes, and high blood pressure? Blood pressure and heart disease High blood pressure causes heart disease and increases the risk of stroke. This is more likely to develop in people who have high blood pressure readings, are of African descent, or are overweight. Have your blood pressure checked: Every 3-5 years if you are 71-42 years of age. Every year if you are 19 years old or older. Diabetes Have regular diabetes screenings. This checks your fasting blood sugar level. Have the screening done: Once every three years after age 42 if you are at a normal weight and have a low risk for diabetes. More often and at a younger age if you are overweight or have a high risk for diabetes. What should I know about preventing infection? Hepatitis B If you have a higher risk for hepatitis B, you should be screened for this virus. Talk with your health care provider to find out if you are at risk for hepatitis B infection. Hepatitis C Testing is recommended for: Everyone born from [birth year] through 1967. Anyone with known risk factors for hepatitis C. Sexually transmitted infections (STIs) Get screened for STIs, including gonorrhea and chlamydia, if: You are sexually active and are younger than 67 years of age. You are older than 67 years of age and your health care  provider tells you that you are at risk for this type of infection. Your sexual activity has changed since you were last screened, and you are at increased risk for chlamydia or gonorrhea. Ask your health care provider if you are at risk. Ask your health care provider about whether you are at high risk for HIV. Your health care  provider may recommend a prescription medicine to help prevent HIV infection. If you choose to take medicine to prevent HIV, you should first get tested for HIV. You should then be tested every 3 months for as long as you are taking the medicine. Pregnancy If you are about to stop having your period (premenopausal) and you may become pregnant, seek counseling before you get pregnant. Take 400 to 800 micrograms (mcg) of folic acid every day if you become pregnant. Ask for birth control (contraception) if you want to prevent pregnancy. Osteoporosis and menopause Osteoporosis is a disease in which the bones lose minerals and strength with aging. This can result in bone fractures. If you are 63 years old or older, or if you are at risk for osteoporosis and fractures, ask your health care provider if you should: Be screened for bone loss. Take a calcium or vitamin D supplement to lower your risk of fractures. Be given hormone replacement therapy (HRT) to treat symptoms of menopause. Follow these instructions at home: Lifestyle Do not use any products that contain nicotine or tobacco, such as cigarettes, e-cigarettes, and chewing tobacco. If you need help quitting, ask your health care provider. Do not use street drugs. Do not share needles. Ask your health care provider for help if you need support or information about quitting drugs. Alcohol use Do not drink alcohol if: Your health care provider tells you not to drink. You are pregnant, may be pregnant, or are planning to become pregnant. If you drink alcohol: Limit how much you use to 0-1 drink a day. Limit intake if you are breastfeeding. Be aware of how much alcohol is in your drink. In the [place] one drink equals one 12 oz bottle of beer (355 mL), one 5 oz glass of wine (148 mL), or one 1 oz glass of hard liquor (44 mL). General instructions Schedule regular health, dental, and eye exams. Stay current with your vaccines. Tell your health  care provider if: You often feel depressed. You have ever been abused or do not feel safe at home. Summary Adopting a healthy lifestyle and getting preventive care are important in promoting health and wellness. Follow your health care provider's instructions about healthy diet, exercising, and getting tested or screened for diseases. Follow your health care provider's instructions on monitoring your cholesterol and blood pressure. This information is not intended to replace advice given to you by your health care provider. Make sure you discuss any questions you have with your health care provider. Document Revised: 01/14/2021 Document Reviewed: 10/30/2018 Elsevier Patient Education  2022 [employer].

## 2021-09-08 ENCOUNTER — Ambulatory Visit (INDEPENDENT_AMBULATORY_CARE_PROVIDER_SITE_OTHER): Payer: Medicare Other | Admitting: Internal Medicine

## 2021-09-08 ENCOUNTER — Other Ambulatory Visit: Payer: Self-pay

## 2021-09-08 VITALS — BP 142/88 | HR 59 | Temp 98.0°F | Ht 67.0 in | Wt 165.2 lb

## 2021-09-08 DIAGNOSIS — Z0000 Encounter for general adult medical examination without abnormal findings: Secondary | ICD-10-CM | POA: Diagnosis not present

## 2021-09-08 DIAGNOSIS — G4700 Insomnia, unspecified: Secondary | ICD-10-CM

## 2021-09-08 DIAGNOSIS — I480 Paroxysmal atrial fibrillation: Secondary | ICD-10-CM | POA: Diagnosis not present

## 2021-09-08 DIAGNOSIS — E7849 Other hyperlipidemia: Secondary | ICD-10-CM

## 2021-09-08 DIAGNOSIS — M85851 Other specified disorders of bone density and structure, right thigh: Secondary | ICD-10-CM | POA: Diagnosis not present

## 2021-09-08 LAB — CBC WITH DIFFERENTIAL/PLATELET
Basophils Absolute: 0 10*3/uL (ref 0.0–0.1)
Basophils Relative: 1 % (ref 0.0–3.0)
Eosinophils Absolute: 0.1 10*3/uL (ref 0.0–0.7)
Eosinophils Relative: 2.4 % (ref 0.0–5.0)
HCT: 38.7 % (ref 36.0–46.0)
Hemoglobin: 13.2 g/dL (ref 12.0–15.0)
Lymphocytes Relative: 39.1 % (ref 12.0–46.0)
Lymphs Abs: 1.5 10*3/uL (ref 0.7–4.0)
MCHC: 34.2 g/dL (ref 30.0–36.0)
MCV: 89.9 fl (ref 78.0–100.0)
Monocytes Absolute: 0.4 10*3/uL (ref 0.1–1.0)
Monocytes Relative: 9.3 % (ref 3.0–12.0)
Neutro Abs: 1.9 10*3/uL (ref 1.4–7.7)
Neutrophils Relative %: 48.2 % (ref 43.0–77.0)
Platelets: 192 10*3/uL (ref 150.0–400.0)
RBC: 4.3 Mil/uL (ref 3.87–5.11)
RDW: 12.8 % (ref 11.5–15.5)
WBC: 3.9 10*3/uL — ABNORMAL LOW (ref 4.0–10.5)

## 2021-09-08 LAB — LIPID PANEL
Cholesterol: 154 mg/dL (ref 0–200)
HDL: 54.2 mg/dL (ref >39.00)
LDL Cholesterol: 77 mg/dL (ref 0–99)
NonHDL: 99.91
Total CHOL/HDL Ratio: 3
Triglycerides: 113 mg/dL (ref 0.0–149.0)
VLDL: 22.6 mg/dL (ref 0.0–40.0)

## 2021-09-08 LAB — COMPREHENSIVE METABOLIC PANEL
ALT: 18 U/L (ref 0–35)
AST: 23 U/L (ref 0–37)
Albumin: 4.5 g/dL (ref 3.5–5.2)
Alkaline Phosphatase: 75 U/L (ref 39–117)
BUN: 17 mg/dL (ref 6–23)
CO2: 31 mEq/L (ref 19–32)
Calcium: 9.8 mg/dL (ref 8.4–10.5)
Chloride: 103 mEq/L (ref 96–112)
Creatinine, Ser: 0.9 mg/dL (ref 0.40–1.20)
GFR: 66.03 mL/min (ref >60.00)
Glucose, Bld: 89 mg/dL (ref 70–99)
Potassium: 4.9 mEq/L (ref 3.5–5.1)
Sodium: 139 mEq/L (ref 135–145)
Total Bilirubin: 0.8 mg/dL (ref 0.2–1.2)
Total Protein: 6.7 g/dL (ref 6.0–8.3)

## 2021-09-08 LAB — TSH: TSH: 2.08 u[IU]/mL (ref 0.35–5.50)

## 2021-09-08 MED ORDER — LORATADINE 10 MG PO TABS: 10.0000 mg | ORAL_TABLET | Freq: Every day | ORAL | 11 refills | Status: DC

## 2021-09-08 MED ORDER — FLUTICASONE PROPIONATE 50 MCG/ACT NA SUSP: 2.0000 | Freq: Every day | NASAL | 6 refills | Status: DC

## 2021-09-08 NOTE — Assessment & Plan Note (Signed)
Chronic dexa up to date Continue regular exercise Continue calcium and vitamin d

## 2021-09-08 NOTE — Assessment & Plan Note (Signed)
chronic On metoprolol xl 25 mg bid, ASA 81 mg daily Cbc, cmp

## 2021-09-08 NOTE — Assessment & Plan Note (Addendum)
Chronic Controlled, stable Continue melatonin, ambien 2.5 mg HS prn

## 2021-09-08 NOTE — Assessment & Plan Note (Signed)
Chronic Check lipid panel  Continue atorvastatin 20 mg daily Regular exercise and healthy diet encouraged  

## 2021-09-10 ENCOUNTER — Other Ambulatory Visit
Admission: RE | Admit: 2021-09-10 | Discharge: 2021-09-10 | Disposition: A | Discharge status: Home / Self Care | Payer: Medicare Other | Source: Ambulatory Visit | Attending: Primary Care | Admitting: Primary Care

## 2021-09-10 DIAGNOSIS — I10 Essential (primary) hypertension: Secondary | ICD-10-CM | POA: Insufficient documentation

## 2021-09-10 DIAGNOSIS — G40909 Epilepsy, unspecified, not intractable, without status epilepticus: Secondary | ICD-10-CM | POA: Insufficient documentation

## 2021-09-10 DIAGNOSIS — E7800 Pure hypercholesterolemia, unspecified: Secondary | ICD-10-CM | POA: Insufficient documentation

## 2021-09-10 DIAGNOSIS — E034 Atrophy of thyroid (acquired): Secondary | ICD-10-CM | POA: Insufficient documentation

## 2021-09-10 DIAGNOSIS — E559 Vitamin D deficiency, unspecified: Secondary | ICD-10-CM | POA: Insufficient documentation

## 2021-09-10 LAB — LIPID PANEL
Chol/HDL Ratio: 1.7
Cholesterol: 232 mg/dL — AB
HDL: 135 mg/dL — ABNORMAL HIGH (ref 40–60)
LDL Calculated: 85 mg/dL
Non HDL Cholesterol: 97 mg/dL
Triglycerides: 59 mg/dL

## 2021-09-10 LAB — COMPREHENSIVE METABOLIC PANEL
ALT: 38 U/L — ABNORMAL HIGH (ref 0–35)
AST: 24 U/L (ref 0–35)
Albumin: 5.2 g/dL (ref 3.5–5.2)
Alk Phos: 71 U/L (ref 35–105)
Anion Gap: 16 (ref 7–16)
Bilirubin,Total: 0.4 mg/dL (ref 0.0–1.2)
CO2: 24 mmol/L (ref 20–28)
Calcium: 9.7 mg/dL (ref 8.6–10.2)
Chloride: 98 mmol/L (ref 96–108)
Creatinine: 0.54 mg/dL (ref 0.51–0.95)
Glucose: 93 mg/dL (ref 60–99)
Lab: 15 mg/dL (ref 6–20)
Potassium: 4.4 mmol/L (ref 3.3–5.1)
Sodium: 138 mmol/L (ref 133–145)
Total Protein: 7.2 g/dL (ref 6.3–7.7)
eGFR BY CREAT: 100 *

## 2021-09-10 LAB — CBC
Hematocrit: 42 % (ref 34–45)
Hemoglobin: 13.4 g/dL (ref 11.2–15.7)
MCH: 33 pg — ABNORMAL HIGH (ref 26–32)
MCHC: 32 g/dL (ref 32–36)
MCV: 103 fL — ABNORMAL HIGH (ref 79–95)
Platelets: 247 10*3/uL (ref 160–370)
RBC: 4.1 MIL/uL (ref 3.9–5.2)
RDW: 12.7 % (ref 11.7–14.4)
WBC: 4.6 10*3/uL (ref 4.0–10.0)

## 2021-09-10 LAB — VITAMIN D: 25-OH Vit Total: 49 ng/mL (ref 30–60)

## 2021-09-10 LAB — TSH: TSH: 1.35 u[IU]/mL (ref 0.27–4.20)

## 2021-09-12 ENCOUNTER — Encounter: Payer: Self-pay | Admitting: Primary Care

## 2021-09-14 NOTE — Progress Notes (Signed)
Carelink Summary Report / Loop Recorder 

## 2021-09-20 ENCOUNTER — Ambulatory Visit: Payer: Medicare Other | Admitting: Internal Medicine

## 2021-09-20 ENCOUNTER — Encounter: Payer: Self-pay | Admitting: Internal Medicine

## 2021-09-20 ENCOUNTER — Other Ambulatory Visit: Payer: Self-pay

## 2021-09-20 VITALS — BP 130/66 | HR 71 | Ht 67.0 in | Wt 165.8 lb

## 2021-09-20 DIAGNOSIS — R55 Syncope and collapse: Secondary | ICD-10-CM

## 2021-09-20 DIAGNOSIS — I480 Paroxysmal atrial fibrillation: Secondary | ICD-10-CM

## 2021-09-20 DIAGNOSIS — Z8679 Personal history of other diseases of the circulatory system: Secondary | ICD-10-CM | POA: Diagnosis not present

## 2021-09-20 NOTE — Progress Notes (Signed)
HPI Ariel Simpson returns today for followup. She is a pleasant 65 yo woman with a h/o unexplained syncope as well as prior mitral valve repair/MAZE and autonomic dysfunction. I implanted an ILR back in May. She has not had any pauses or other events. She feels well. Today she did 2 yoga classes.  No Known Allergies   Current Outpatient Medications  Medication Sig Dispense Refill   amoxicillin (AMOXIL) 500 MG capsule TAKE 4 CAPSULES BY MOUTH 1 HOUR BEFORE DENTAL APPOINTMENT 4 capsule 3   aspirin 81 MG tablet Take 1 tablet (81 mg total) by mouth daily.     atorvastatin (LIPITOR) 20 MG tablet TAKE 1 TABLET BY MOUTH DAILY 90 tablet 1   Calcium Carbonate-Vitamin D (CALCIUM + D PO) Take 1 capsule by mouth daily.      fluticasone (FLONASE) 50 MCG/ACT nasal spray Place 2 sprays into both nostrils daily. 16 g 6   loratadine (CLARITIN) 10 MG tablet Take 1 tablet (10 mg total) by mouth daily. 30 tablet 11   Melatonin 10 MG TABS Take by mouth.     metoprolol succinate (TOPROL-XL) 25 MG 24 hr tablet TAKE 1 TABLET(25 MG) BY MOUTH IN THE MORNING AND AT BEDTIME WITH OR IMMEDIATELY FOLLOWING A MEAL 180 tablet 2   Misc Natural Products (GLUCOSAMINE CHOND COMPLEX/MSM PO) Take 1 tablet by mouth daily.     Omega-3 Fatty Acids (FISH OIL PO) Take 2 capsules by mouth daily.     polyethylene glycol (MIRALAX / GLYCOLAX) packet Take 17 g by mouth daily.     TURMERIC PO Take 1 tablet by mouth daily.      zolpidem (AMBIEN) 5 MG tablet Take 0.5 tablets (2.5 mg total) by mouth at bedtime as needed for sleep. 45 tablet 0   No current facility-administered medications for this visit.     Past Medical History:  Diagnosis Date   Arthritis    Benign essential tremor    CHF (congestive heart failure) (HCC)    Heart murmur    High cholesterol    Hypertension    Insomnia    Mitral regurgitation    Mitral valve prolapse    Osteopenia    Paroxysmal atrial fibrillation (HCC) 03/11/2013   Paroxysmal  supraventricular tachycardia (HCC)    PONV (postoperative nausea and vomiting)    Dizziness and Nausea   S/P Maze operation for atrial fibrillation 03/18/2013   Complete bilateral atrial lesion set using cryothermy and biopolar radiofrequency ablation with oversewing of LA appendage via right mini thoracotomy   S/P mitral valve repair 03/18/2013   Complex valvuloplasty including triangular resection of posterior leaflet, artificial Gore-tex neocord placement x4 and 22mm Sorin Memo 3D ring annuloplasty via right mini thoracotomy   Severe mitral regurgitation 03/03/2013   Acute on chronic with acute heart failure    Shortness of breath    Wears glasses     ROS:   All systems reviewed and negative except as noted in the HPI.   Past Surgical History:  Procedure Laterality Date   ABDOMINAL HYSTERECTOMY     hernia repair   BIOPSY  08/26/2019   Procedure: BIOPSY;  Surgeon: Wilford Corner, MD;  [facility]: [facility];  Service: Endoscopy;;   CARDIAC CATHETERIZATION     COLONOSCOPY     COLONOSCOPY WITH PROPOFOL N/A 08/26/2019   Procedure: COLONOSCOPY WITH PROPOFOL;  Surgeon: Wilford Corner, MD;  [facility];  Service: Endoscopy;  Laterality: N/A;   INTRAOPERATIVE TRANSESOPHAGEAL ECHOCARDIOGRAM N/A 03/18/2013  Procedure: INTRAOPERATIVE TRANSESOPHAGEAL ECHOCARDIOGRAM;  Surgeon: Rexene Alberts, MD;  Location: [place];  Service: Open Heart Surgery;  Laterality: N/A;   LEFT AND RIGHT HEART CATHETERIZATION WITH CORONARY ANGIOGRAM N/A 03/06/2013   Procedure: LEFT AND RIGHT HEART CATHETERIZATION WITH CORONARY ANGIOGRAM;  Surgeon: Sinclair Grooms, MD;  Location: [facility] CATH LAB;  Service: Cardiovascular;  Laterality: N/A;   MAZE N/A 03/18/2013   Procedure: MAZE;  Surgeon: Rexene Alberts, MD;  Location: [place];  Service: Open Heart Surgery;  Laterality: N/A;   MITRAL VALVE REPAIR Right 03/18/2013   Procedure: MINIMALLY INVASIVE MITRAL VALVE REPAIR (MVR);  Surgeon: Rexene Alberts, MD;   Location: [place];  Service: Open Heart Surgery;  Laterality: Right;   VARICOSE VEIN SURGERY     WISDOM TOOTH EXTRACTION       Family History  Problem Relation Age of Onset   Pancreatic cancer Mother    Stomach cancer Father    Hypertension Father    Thyroid cancer Sister    Colon cancer Paternal Grandmother    Stomach cancer Paternal Grandfather    Seizures Child    Down syndrome Child      Social History   Socioeconomic History   Marital status: Married    Spouse name: Ariel Simpson   Number of children: 3   Years of education: BS   Highest education level: Not on file  Occupational History    Employer: [employer]  Tobacco Use   Smoking status: Never   Smokeless tobacco: Never  Vaping Use   Vaping Use: Never used  Substance and Sexual Activity   Alcohol use: Yes    Alcohol/week: 5.0 - 6.0 standard drinks    Types: 5 - 6 Glasses of wine per week    Comment: 1 drink/day   Drug use: No   Sexual activity: Yes    Birth control/protection: Surgical  Other Topics Concern   Not on file  Social History Narrative   Patient lives at home with her husband  Ariel Simpson)  - second husband. Patient has college education B.S. 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   Caffeine- one cup daily.   Social Determinants of Health   Financial Resource Strain: Not on file  Food Insecurity: Not on file  Transportation Needs: Not on file  Physical Activity: Not on file  Stress: Not on file  Social Connections: Not on file  Intimate Partner Violence: Not on file     BP 130/66   Pulse 71   Ht 5\' 7"  (1.702 m)   Wt 165 lb 12.8 oz (75.2 kg)   SpO2 95%   BMI 25.97 kg/m   Physical Exam:  Well appearing NAD HEENT: Unremarkable Neck:  No JVD, no thyromegally Lymphatics:  No adenopathy Back:  No CVA tenderness Lungs:  Clear with no wheezes HEART:  Regular rate rhythm, no murmurs, no rubs, no clicks Abd:  soft, positive bowel sounds, no organomegally,  no rebound, no guarding Ext:  2 plus pulses, no edema, no cyanosis, no clubbing Skin:  No rashes no nodules Neuro:  CN II through XII intact, motor grossly intact  DEVICE  Normal device function.  See PaceArt for details.   Assess/Plan:  1. Unexplained syncope - she has undergone insertion of a Medtronic ILR. No arrhythmias. 2. PAF - we will be able to monitor this as she is not on systemic anti-coagulation. She has not had any known arrhythmias since her  MAZE.  3. SVT - she has had 2 known episodes. We will follow with the ILR in place.    Carleene Overlie Odessia Asleson,MD

## 2021-09-20 NOTE — Patient Instructions (Addendum)
Medication Instructions:  Your physician recommends that you continue on your current medications as directed. Please refer to the Current Medication list given to you today.  Labwork: None ordered.  Testing/Procedures: None ordered.  Follow-Up: Your physician wants you to follow-up in: as needed with Cristopher Peru, MD   Remote monitoring is used to monitor your loop recorder monthly from home.    Any Other Special Instructions Will Be Listed Below (If Applicable).  If you need a refill on your cardiac medications before your next appointment, please call your pharmacy.

## 2021-09-23 ENCOUNTER — Other Ambulatory Visit: Payer: Self-pay | Admitting: Primary Care

## 2021-09-27 ENCOUNTER — Other Ambulatory Visit: Payer: Self-pay | Admitting: Internal Medicine

## 2021-09-29 ENCOUNTER — Ambulatory Visit: Payer: Medicare Other | Admitting: Neurology

## 2021-10-05 LAB — CUP PACEART REMOTE DEVICE CHECK
Date Time Interrogation Session: 20221112084045
Implantable Pulse Generator Implant Date: 20220531

## 2021-10-10 ENCOUNTER — Ambulatory Visit: Payer: Medicare Other | Admitting: Primary Care

## 2021-10-10 ENCOUNTER — Ambulatory Visit (INDEPENDENT_AMBULATORY_CARE_PROVIDER_SITE_OTHER): Payer: Medicare Other

## 2021-10-10 DIAGNOSIS — R55 Syncope and collapse: Secondary | ICD-10-CM | POA: Diagnosis not present

## 2021-10-11 ENCOUNTER — Other Ambulatory Visit: Payer: Self-pay | Admitting: Primary Care

## 2021-10-17 ENCOUNTER — Ambulatory Visit: Payer: Medicare Other | Admitting: Primary Care

## 2021-10-19 ENCOUNTER — Telehealth: Payer: Self-pay | Admitting: Internal Medicine

## 2021-10-19 NOTE — Progress Notes (Signed)
Carelink Summary Report / Loop Recorder 

## 2021-10-19 NOTE — Telephone Encounter (Signed)
Left message for patient to call me back at [phone] to schedule Medicare Annual Wellness Visit   Last AWV  09/14/20  Please schedule at anytime with [facility] if patient calls the office back.    40 Minutes appointment   Any questions, please call me at [phone]

## 2021-10-20 ENCOUNTER — Ambulatory Visit: Payer: Medicare Other | Attending: Primary Care | Admitting: Primary Care

## 2021-10-20 DIAGNOSIS — M810 Age-related osteoporosis without current pathological fracture: Secondary | ICD-10-CM | POA: Insufficient documentation

## 2021-10-20 DIAGNOSIS — K219 Gastro-esophageal reflux disease without esophagitis: Secondary | ICD-10-CM

## 2021-10-20 DIAGNOSIS — I10 Essential (primary) hypertension: Secondary | ICD-10-CM

## 2021-10-20 NOTE — Progress Notes (Signed)
Video Visit     Location of Patient: home    Location of Telemedicine Provider: home / other    Other participants in telemedicine encounter and roles:  husband, Pilar Plate    This is an established patient visit.    Reason for visit: Hypertension      HPI  Weight loss --has stabilized  Normal appetitie  Sadly, she believes that she lost some of her muscle mass d/t being more sedentary  She is doing the routine that the trainer helped her with  Arm weights  Discussed going back to PT.  I do think that if she went first thing in the morning and was masked, this would be okay, esp since she is noting loss of muscle mass which can negatively impact her bone strength as well  They see their chiropracter first thing at 8am    She needs new orthotocs  Within 10 min of a walk she has to keep stopping to retie her sneakers   The shoes are being made more lightweight  And slipper like  She needs to use the harder shoe laces, which can be hard to find  Can't put the orthotics in different shoes on her own, wears the same pair of sneakers all the time as a result    Foot performance center--didn't help  Has the mental energy to start tackling this now    Husband had severe pain yesterday  He was feeling well; thoracic pain on left side he took 1/2 muscle relaxer and 1/2 vicodin three times  Ultimately the pain improved  He did some back bends it hurt but also made him feel better  10mg   At bedtime, 1/2 in am  Another full vicodin in the am    ROS  Denies f/c.  Urinary sx are unchanged--frequency and urgency    Patient's problem list, allergies, and medications were reviewed and updated as appropriate.  Please see the EHR for full details.    Exam and data reviewed:  BP today 121/80    Assessment & Plan:  1.  Osteoporosis --discussed weighing risks and benefits of PT   I do think she would benefit from resumption of PT and would mitigate risk by doing this first thing in the morning ; her PT has reassured her that everyone would be  masked  She is going to consider it and will let me know if she needs a referral   2. HTN--well controlled on current regimen  3. Weight loss--has stabilized, will continue to monitor  Continue to manage GERD with PPI/H2 blocker combination  Regular meals and snacks encouraged  4.  We will both start researching where she could go for custom orthotics    Consent was obtained from the patient to complete this video visit; including the potential for financial liability.  44 min personally spent on the date of the encounter including face to face pre and post visit work        Leeann Must, MD    [phone]

## 2021-10-23 ENCOUNTER — Encounter: Payer: Self-pay | Admitting: Primary Care

## 2021-10-28 ENCOUNTER — Other Ambulatory Visit: Payer: Self-pay

## 2021-10-28 ENCOUNTER — Ambulatory Visit (INDEPENDENT_AMBULATORY_CARE_PROVIDER_SITE_OTHER): Payer: Medicare Other

## 2021-10-28 DIAGNOSIS — Z0000 Encounter for general adult medical examination without abnormal findings: Secondary | ICD-10-CM | POA: Diagnosis not present

## 2021-10-28 NOTE — Progress Notes (Signed)
I connected with Ariel Simpson today by telephone and verified that I am speaking with the correct person using two identifiers. Location patient: home Location provider: work Persons participating in the virtual visit: patient, provider.   I discussed the limitations, risks, security and privacy concerns of performing an evaluation and management service by telephone and the availability of in person appointments. I also discussed with the patient that there may be a patient responsible charge related to this service. The patient expressed understanding and verbally consented to this telephonic visit.    Interactive audio and video telecommunications were attempted between this provider and patient, however failed, due to patient having technical difficulties OR patient did not have access to video capability.  We continued and completed visit with audio only.  Some vital signs may be absent or patient reported.   Time Spent with patient on telephone encounter: 40 minutes  Subjective:   Ariel Simpson is a 65 y.o. female who presents for Medicare Annual (Subsequent) preventive examination.  Review of Systems     Cardiac Risk Factors include: advanced age (>35men, >67 women);dyslipidemia;family history of premature cardiovascular disease;hypertension     Objective:    Today's Vitals   10/28/21 1306  PainSc: 3    There is no height or weight on file to calculate BMI.  Advanced Directives 10/28/2021 09/14/2020 01/16/2020 08/26/2019 03/18/2013 03/12/2013 03/03/2013  Does Patient Have a Medical Advance Directive? Yes Yes Yes Yes Patient has advance directive, copy in chart Patient has advance directive, copy not in chart Patient has advance directive, copy not in chart  Type of Advance Directive Living will;Healthcare Power of Attorney Living will;Healthcare Power of [place];Living will [place];Living will [place];Living will [place];Living will [place];Living will  Does patient want to make changes to medical advance directive? No - Patient declined No - Patient declined - - - - -  Copy of [place] in Chart? No - copy requested No - copy requested - Yes - validated most recent copy scanned in chart (See row information) - Copy requested from family Copy requested from family  Pre-existing out of facility DNR order (yellow form or pink MOST form) - - - - No - -    Current Medications (verified) Outpatient Encounter Medications as of 10/28/2021  Medication Sig   amoxicillin (AMOXIL) 500 MG capsule TAKE 4 CAPSULES BY MOUTH 1 HOUR BEFORE DENTAL APPOINTMENT   aspirin 81 MG tablet Take 1 tablet (81 mg total) by mouth daily.   atorvastatin (LIPITOR) 20 MG tablet TAKE 1 TABLET BY MOUTH DAILY   Calcium Carbonate-Vitamin D (CALCIUM + D PO) Take 1 capsule by mouth daily.    metoprolol succinate (TOPROL-XL) 25 MG 24 hr tablet TAKE 1 TABLET(25 MG) BY MOUTH IN THE MORNING AND AT BEDTIME WITH OR IMMEDIATELY FOLLOWING A MEAL   Misc Natural Products (GLUCOSAMINE CHOND COMPLEX/MSM PO) Take 1 tablet by mouth daily.   Omega-3 Fatty Acids (FISH OIL PO) Take 2 capsules by mouth daily.   polyethylene glycol (MIRALAX / GLYCOLAX) packet Take 17 g by mouth daily.   TURMERIC PO Take 1 tablet by mouth daily.    zolpidem (AMBIEN) 5 MG tablet Take 0.5 tablets (2.5 mg total) by mouth at bedtime as needed for sleep.   fluticasone (FLONASE) 50 MCG/ACT nasal spray Place 2 sprays into both nostrils daily. (Patient not taking: Reported on 10/28/2021)   loratadine (CLARITIN) 10  MG tablet Take 1 tablet (10 mg total) by mouth daily. (Patient not taking: Reported on 10/28/2021)   Melatonin 10 MG TABS Take by mouth. (Patient not taking: Reported on 10/28/2021)   No facility-administered encounter medications on file as of 10/28/2021.    Allergies (verified) Patient has no  known allergies.   History: Past Medical History:  Diagnosis Date   Arthritis    Benign essential tremor    CHF (congestive heart failure) (HCC)    Heart murmur    High cholesterol    Hypertension    Insomnia    Mitral regurgitation    Mitral valve prolapse    Osteopenia    Paroxysmal atrial fibrillation (HCC) 03/11/2013   Paroxysmal supraventricular tachycardia (HCC)    PONV (postoperative nausea and vomiting)    Dizziness and Nausea   S/P Maze operation for atrial fibrillation 03/18/2013   Complete bilateral atrial lesion set using cryothermy and biopolar radiofrequency ablation with oversewing of LA appendage via right mini thoracotomy   S/P mitral valve repair 03/18/2013   Complex valvuloplasty including triangular resection of posterior leaflet, artificial Gore-tex neocord placement x4 and 60mm Sorin Memo 3D ring annuloplasty via right mini thoracotomy   Severe mitral regurgitation 03/03/2013   Acute on chronic with acute heart failure    Shortness of breath    Wears glasses    Past Surgical History:  Procedure Laterality Date   ABDOMINAL HYSTERECTOMY     hernia repair   BIOPSY  08/26/2019   Procedure: BIOPSY;  Surgeon: Wilford Corner, MD;  Location: [facility];  Service: Endoscopy;;   CARDIAC CATHETERIZATION     COLONOSCOPY     COLONOSCOPY WITH PROPOFOL N/A 08/26/2019   Procedure: COLONOSCOPY WITH PROPOFOL;  Surgeon: Wilford Corner, MD;  Location: [facility];  Service: Endoscopy;  Laterality: N/A;   INTRAOPERATIVE TRANSESOPHAGEAL ECHOCARDIOGRAM N/A 03/18/2013   Procedure: INTRAOPERATIVE TRANSESOPHAGEAL ECHOCARDIOGRAM;  Surgeon: Rexene Alberts, MD;  Location: [place];  Service: Open Heart Surgery;  Laterality: N/A;   LEFT AND RIGHT HEART CATHETERIZATION WITH CORONARY ANGIOGRAM N/A 03/06/2013   Procedure: LEFT AND RIGHT HEART CATHETERIZATION WITH CORONARY ANGIOGRAM;  Surgeon: Sinclair Grooms, MD;  Location[facility] C[facility] LAB;  Service: Cardiovascular;  Laterality: N/A;    MAZE N/A 03/18/2013   Procedure: MAZE;  Surgeon: Rexene Alberts, MD;  Location: [place];  Service: Open Heart Surgery;  Laterality: N/A;   MITRAL VALVE REPAIR Right 03/18/2013   Procedure: MINIMALLY INVASIVE MITRAL VALVE REPAIR (MVR);  Surgeon: Rexene Alberts, MD;  Location: [place];  Service: Open Heart Surgery;  Laterality: Right;   VARICOSE VEIN SURGERY     WISDOM TOOTH EXTRACTION     Family History  Problem Relation Age of Onset   Pancreatic cancer Mother    Stomach cancer Father    Hypertension Father    Thyroid cancer Sister    Colon cancer Paternal Grandmother    Stomach cancer Paternal Grandfather    Seizures Child    Down syndrome Child    Social History   Socioeconomic History   Marital status: Married    Spouse name: Fritz Pickerel   Number of children: 3   Years of education: BS   Highest education level: Not on file  Occupational History    Employer: [employer]  Tobacco Use   Smoking status: Never   Smokeless tobacco: Never  Vaping Use   Vaping Use: Never used  Substance and Sexual Activity   Alcohol use: Yes  Alcohol/week: 5.0 - 6.0 standard drinks    Types: 5 - 6 Glasses of wine per week    Comment: 1 drink/day   Drug use: No   Sexual activity: Yes    Birth control/protection: Surgical  Other Topics Concern   Not on file  Social History Narrative   Patient lives at home with her husband  Fritz Pickerel)  - second husband. Patient has college education B.S. 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   Caffeine- one cup daily.   Social Determinants of Health   Financial Resource Strain: Low Risk    Difficulty of Paying Living Expenses: Not hard at all  Food Insecurity: No Food Insecurity   Worried About Charity fundraiser in the Last Year: Never true   [place] in the Last Year: Never true  Transportation Needs: No Transportation Needs   Lack of Transportation (Medical): No   Lack of Transportation (Non-Medical): No   Physical Activity: Sufficiently Active   Days of Exercise per Week: 7 days   Minutes of Exercise per Session: 60 min  Stress: No Stress Concern Present   Feeling of Stress : Not at all  Social Connections: Socially Integrated   Frequency of Communication with Friends and Family: More than three times a week   Frequency of Social Gatherings with Friends and Family: Once a week   Attends Religious Services: 1 to 4 times per year   Active Member of [employer] or Organizations: Yes   Attends Archivist Meetings: 1 to 4 times per year   Marital Status: Married    Tobacco Counseling Counseling given: Not Answered   Clinical Intake:  Pre-visit preparation completed: Yes  Pain : 0-10 Pain Score: 3  Pain Type: Acute pain Pain Location: Hip Pain Orientation: Right Pain Radiating Towards: lower extremity Pain Descriptors / Indicators: Discomfort Pain Onset: More than a month ago Pain Frequency: Intermittent Pain Relieving Factors: Ibuprofren  Pain Relieving Factors: Ibuprofren  Nutritional Risks: None Diabetes: No  How often do you need to have someone help you when you read instructions, pamphlets, or other written materials from your doctor or pharmacy?: 1 - Never What is the last grade level you completed in school?: B.A. degree  Diabetic? no  Interpreter Needed?: No  Information entered by :: Lisette Abu, LPN   Activities of Daily Living In your present state of health, do you have any difficulty performing the following activities: 10/28/2021  Hearing? N  Vision? N  Difficulty concentrating or making decisions? N  Walking or climbing stairs? N  Dressing or bathing? N  Doing errands, shopping? N  Preparing Food and eating ? N  Using the Toilet? N  In the past six months, have you accidently leaked urine? N  Do you have problems with loss of bowel control? N  Managing your Medications? N  Managing your Finances? N  Housekeeping or managing your  Housekeeping? N  Some recent data might be hidden    Patient Care Team: Binnie Rail, MD as PCP - General (Internal Medicine) Belva Crome, MD as PCP - Cardiology (Cardiology) Rexene Alberts, MD (Inactive) as Consulting Physician (Cardiothoracic Surgery) Belva Crome, MD as Consulting Physician (Cardiology) Tat, Eustace Quail, DO as Consulting Physician (Neurology) Charlton Haws, [facility] as Pharmacist (Pharmacist) Melissa Noon, [place] as Referring Physician (Optometry)  Indicate any recent Medical Services you may have received from other than [facility] providers in the past  year (date may be approximate).     Assessment:   This is a routine wellness examination for Ariel Simpson.  Hearing/Vision screen Hearing Screening - Comments:: Patient has difficulty hearing due to tinnitus. No hearing aids at this time. Vision Screening - Comments:: Patient wears corrective glasses/contacts.  Eye exam done annually by: Dr. Melissa Noon at [facility].  Dietary issues and exercise activities discussed: Current Exercise Habits: Home exercise routine, Type of exercise: yoga;walking, Time (Minutes): 60, Frequency (Times/Week): 7, Weekly Exercise (Minutes/Week): 420, Intensity: Moderate, Exercise limited by: orthopedic condition(s)   Goals Addressed   None   Depression Screen PHQ 2/9 Scores 10/28/2021 09/14/2020 09/06/2020 09/05/2019 02/19/2018 07/28/2013 04/16/2013  PHQ - 2 Score 0 1 0 0 0 0 0    Fall Risk Fall Risk  10/28/2021 09/14/2020 01/16/2020 09/05/2019 02/21/2018  Falls in the past year? 0 0 0 0 Yes  Number falls in past yr: 0 0 0 0 1  Injury with Fall? 0 0 0 - No  Risk for fall due to : No Fall Risks No Fall Risks - - -  Follow up Falls prevention discussed Falls evaluation completed - - Falls evaluation completed    FALL RISK PREVENTION PERTAINING TO THE HOME:  Any stairs in or around the home? Yes  If so, are there any without handrails? No  Home free of loose throw rugs in  walkways, pet beds, electrical cords, etc? Yes  Adequate lighting in your home to reduce risk of falls? Yes   ASSISTIVE DEVICES UTILIZED TO PREVENT FALLS:  Life alert? No  Use of a cane, walker or w/c? No  Grab bars in the bathroom? Yes  Shower chair or bench in shower? Yes  Elevated toilet seat or a handicapped toilet? Yes   TIMED UP AND GO:  Was the test performed? No .  Length of time to ambulate 10 feet: n/a sec.   Gait steady and fast without use of assistive device  Cognitive Function: Normal cognitive status assessed by direct observation by this Nurse Health Advisor. No abnormalities found.          Immunizations Immunization History  Administered Date(s) Administered   Fluad Quad(high Dose 65+) 09/05/2019, 09/06/2020, 08/24/2021   Influenza-Unspecified 08/20/2017   PFIZER(Purple Top)SARS-COV-2 Vaccination 12/12/2019, 12/31/2019, 08/24/2020   Tdap 04/08/2012   Zoster, Live 04/07/2015    TDAP status: Up to date  Flu Vaccine status: Up to date  Pneumococcal vaccine status: Due, Education has been provided regarding the importance of this vaccine. Advised may receive this vaccine at local pharmacy or Health Dept. Aware to provide a copy of the vaccination record if obtained from local pharmacy or Health Dept. Verbalized acceptance and understanding.  Covid-19 vaccine status: Completed vaccines  Qualifies for Shingles Vaccine? Yes   Zostavax completed Yes   Shingrix Completed?: No.    Education has been provided regarding the importance of this vaccine. Patient has been advised to call insurance company to determine out of pocket expense if they have not yet received this vaccine. Advised may also receive vaccine at local pharmacy or Health Dept. Verbalized acceptance and understanding.  Screening Tests Health Maintenance  Topic Date Due   COVID-19 Vaccine (4 - Booster for Pfizer series) 10/19/2020   Zoster Vaccines- Shingrix (1 of 2) 12/09/2021 (Originally  12/05/2003)   Pneumonia Vaccine 7+ Years old (1 - PCV) 08/22/2022 (Originally 12/04/2018)   DEXA SCAN  02/12/2022   TETANUS/TDAP  04/08/2022   MAMMOGRAM  02/19/2023   COLONOSCOPY (Pts 45-37yrs  Insurance coverage will need to be confirmed)  08/25/2024   INFLUENZA VACCINE  Completed   Hepatitis C Screening  Completed   HPV VACCINES  Aged Out    Health Maintenance  Health Maintenance Due  Topic Date Due   COVID-19 Vaccine (4 - Booster for Pfizer series) 10/19/2020    Colorectal cancer screening: Type of screening: Colonoscopy. Completed 08/26/2019. Repeat every 5 years  Mammogram status: Completed 02/18/2021. Repeat every year  Bone Density status: Completed 02/13/2020. Results reflect: Bone density results: OSTEOPENIA. Repeat every 2 years.  Lung Cancer Screening: (Low Dose CT Chest recommended if Age 41-80 years, 30 pack-year currently smoking OR have quit w/in 15years.) does not qualify.   Lung Cancer Screening Referral: no  Additional Screening:  Hepatitis C Screening: does qualify; Completed yes  Vision Screening: Recommended annual ophthalmology exams for early detection of glaucoma and other disorders of the eye. Is the patient up to date with their annual eye exam?  Yes  Who is the provider or what is the name of the office in which the patient attends annual eye exams? Dr. Melissa Noon If pt is not established with a provider, would they like to be referred to a provider to establish care? No .   Dental Screening: Recommended annual dental exams for proper oral hygiene  Community Resource Referral / Chronic Care Management: CRR required this visit?  No   CCM required this visit?  No      Plan:     I have personally reviewed and noted the following in the patient's chart:   Medical and social history Use of alcohol, tobacco or illicit drugs  Current medications and supplements including opioid prescriptions.  Functional ability and status Nutritional  status Physical activity Advanced directives List of other physicians Hospitalizations, surgeries, and ER visits in previous 12 months Vitals Screenings to include cognitive, depression, and falls Referrals and appointments  In addition, I have reviewed and discussed with patient certain preventive protocols, quality metrics, and best practice recommendations. A written personalized care plan for preventive services as well as general preventive health recommendations were provided to patient.     Sheral Flow, LPN   [id number]   Nurse Notes:  Patient is cogitatively intact. There were no vitals filed for this visit. There is no height or weight on file to calculate BMI. Patient stated that she has no issues with gait or balance; does not use any assistive devices. Medications reviewed with patient; no opioid use noted. Hearing Screening - Comments:: Patient has difficulty hearing due to tinnitus. No hearing aids at this time. Vision Screening - Comments:: Patient wears corrective glasses/contacts.  Eye exam done annually by: Dr. Melissa Noon at [facility].

## 2021-10-31 ENCOUNTER — Other Ambulatory Visit: Payer: Self-pay | Admitting: Gastroenterology

## 2021-10-31 ENCOUNTER — Encounter: Payer: Self-pay | Admitting: Internal Medicine

## 2021-11-03 ENCOUNTER — Ambulatory Visit (INDEPENDENT_AMBULATORY_CARE_PROVIDER_SITE_OTHER): Payer: Medicare Other

## 2021-11-03 DIAGNOSIS — Z950 Presence of cardiac pacemaker: Secondary | ICD-10-CM | POA: Diagnosis not present

## 2021-11-03 DIAGNOSIS — R55 Syncope and collapse: Secondary | ICD-10-CM | POA: Diagnosis not present

## 2021-11-03 LAB — CUP PACEART REMOTE DEVICE CHECK
Date Time Interrogation Session: 20221215083756
Implantable Pulse Generator Implant Date: 20220531

## 2021-11-07 ENCOUNTER — Other Ambulatory Visit
Admission: RE | Admit: 2021-11-07 | Discharge: 2021-11-07 | Disposition: A | Discharge status: Home / Self Care | Payer: Medicare Other | Source: Ambulatory Visit | Attending: Obstetrics | Admitting: Obstetrics

## 2021-11-07 DIAGNOSIS — M1711 Unilateral primary osteoarthritis, right knee: Secondary | ICD-10-CM | POA: Diagnosis not present

## 2021-11-07 DIAGNOSIS — M1712 Unilateral primary osteoarthritis, left knee: Secondary | ICD-10-CM | POA: Diagnosis not present

## 2021-11-07 DIAGNOSIS — M810 Age-related osteoporosis without current pathological fracture: Secondary | ICD-10-CM | POA: Insufficient documentation

## 2021-11-07 LAB — BASIC METABOLIC PANEL
Anion Gap: 11 (ref 7–16)
CO2: 30 mmol/L — ABNORMAL HIGH (ref 20–28)
Calcium: 10.2 mg/dL (ref 8.6–10.2)
Chloride: 98 mmol/L (ref 96–108)
Creatinine: 0.53 mg/dL (ref 0.51–0.95)
Glucose: 86 mg/dL (ref 60–99)
Lab: 17 mg/dL (ref 6–20)
Potassium: 4.6 mmol/L (ref 3.3–5.1)
Sodium: 139 mmol/L (ref 133–145)
eGFR BY CREAT: 101 *

## 2021-11-07 LAB — MAGNESIUM: Magnesium: 2.1 mg/dL (ref 1.6–2.5)

## 2021-11-08 ENCOUNTER — Other Ambulatory Visit: Payer: Self-pay | Admitting: Primary Care

## 2021-11-08 NOTE — Telephone Encounter (Signed)
Last office visit:   08/17/2021  Last telemedicine visit:  10/20/2021  Last PA office visit:  Visit date not found  Patients upcoming appointments:  No future appointments.  Recent Lab results:  GENERAL CHEMISTRY   Recent Labs     11/07/21  0935 09/10/21  1050 03/11/21  1140   NA 139 138 140   139   K 4.6 4.4 4.1   4.1   CL 98 98 99   98   CO2 30* '24 26   25   ' GAP '11 16 15   16   ' UN '17 15 15   15   ' CREAT 0.53 0.54 0.53   0.54   GLU 86 93 97   96   CA 10.2 9.7 10.2   10.1      LIPID PROFILE   Recent Labs     09/10/21  1050 03/11/21  1140   CHOL 232* 233*   TRIG 59 80   HDL 135* 130*   LDLC 85 87      LIVER PROFILE   Recent Labs     09/10/21  1050 03/11/21  1140   ALT 38* 33   AST 24 29   ALK 71 79   TB 0.4 0.3      DIABETES THYROID   No value within the past 365 days Recent Labs     09/10/21  1050 03/11/21  1140   TSH 1.35 1.59         Pending/Orders Labs:  Lab Frequency Next Occurrence

## 2021-11-15 NOTE — Progress Notes (Signed)
Carelink Summary Report / Loop Recorder 

## 2021-11-18 ENCOUNTER — Encounter: Payer: Self-pay | Admitting: Orthopedic Surgery

## 2021-11-18 ENCOUNTER — Ambulatory Visit
Admission: RE | Admit: 2021-11-18 | Discharge: 2021-11-18 | Disposition: A | Discharge status: Home / Self Care | Payer: Medicare Other | Source: Ambulatory Visit

## 2021-11-18 ENCOUNTER — Ambulatory Visit: Payer: Medicare Other | Attending: Physician Assistant | Admitting: Physician Assistant

## 2021-11-18 ENCOUNTER — Other Ambulatory Visit: Payer: Self-pay

## 2021-11-18 VITALS — BP 163/75 | HR 85 | Ht 63.0 in | Wt 105.0 lb

## 2021-11-18 DIAGNOSIS — M19071 Primary osteoarthritis, right ankle and foot: Secondary | ICD-10-CM

## 2021-11-18 DIAGNOSIS — M79671 Pain in right foot: Secondary | ICD-10-CM | POA: Insufficient documentation

## 2021-11-18 NOTE — Progress Notes (Signed)
Supervising Physician:  Dr. Bo Merino      CC:  Right foot pain.      HPI:  [name] is a 65 year old Caucasian female with a past medical history significant for a stroke, hypertension, seizure disorder, hypothyroidism, GERD and osteoporosis who is accompanied by her husband today and presents with right foot pain.  She states 2 weeks ago she tripped over a stool and stumbled into the wall.  She had immediate pain in her right foot.  She has the most discomfort with weightbearing and prolonged standing.  She has been using ice, heat and taking Tylenol for pain.  She has been seeing a chiropractor who has been performing laser therapy.  She has not yet had any x-rays.  She is overall feeling a little better.      PMH:  Past Medical History:   Diagnosis Date    Contusion of foot 06/02/2013    Hypertension     Hypothyroidism     Osteoporosis     Seizure     Stroke        PSH:  Past Surgical History:   Procedure Laterality Date    CONVERTED PROCEDURE      Craniotomy A-V Malformation Repair Conversion Data     HX TONSILLECTOMY/ADENOIDECTOMY      Tonsillectomy Conversion Data     TONSILLECTOMY         Medications:  Current Outpatient Medications   Medication Sig    carBAMazepine (TEGRETOL XR) 200 mg 12 hr tablet TAKE 2 TABLETS BY MOUTH TWO TIMES DAILY WITH MEALS ALONG WITH 100MG  TABLET, SWALLOW WHOLE, DO NOT BREAK, CRUSH, OR CHEW    carBAMazepine (CARBATROL) 100 mg 12 hr capsule TAKE 1 CAPSULE BY MOUTH TWO TIMES DAILY    solifenacin (VESICARE) 10 mg tablet TAKE 1 TABLET BY MOUTH EVERY DAY    amLODIPine (NORVASC) 5 mg tablet Take 1 tablet (5 mg total) by mouth daily    levothyroxine (SYNTHROID, LEVOTHROID) 25 mcg tablet Take 1 tablet (25 mcg total) by mouth daily (before breakfast)    ketoconazole (NIZORAL) 2 % cream Apply topically 2 times daily  to the following areas: toenails    econazole nitrate (SPECTAZOLE) 1 % cream Apply topically daily  to the following areas: TOES/TOENAIL    famotidine  (PEPCID) 20 mg tablet Take 1 tablet (20 mg total) by mouth 2 times daily    pseudoephedrine (SUDAFED) 30 mg tablet TAKE 1 TABLET BY MOUTH EVERY 4 HOURS AS NEEDED FOR CONGESTION    azelastine (ASTEPRO) 137 MCG/SPRAY SOLN nasal solution SPRAY 2 SPRAYS IN EACH NOSTRIL TWO TIMES DAILY    RABEprazole (ACIPHEX) 20 MG tablet TAKE 1 TABLET BY MOUTH EVERY DAY    [employer] 12 HOUR NASAL DECONGESTANT 120 MG 12 hr tablet TAKE 1 TABLET BY MOUTH EVERY 12 HOURS    atorvastatin (LIPITOR) 10 mg tablet TAKE 1 TABLET BY MOUTH EVERY DAY WITH DINNER    ciclopirox (PENLAC) 8 % solution Apply topically nightly  to affected nails & adjacent skin daily.  Remove with alcohol every 7 days.    triamcinolone (KENALOG) 0.1 % ointment Apply topically 2 times daily to the following areas: ear    EPINEPHrine (EPIPEN) 0.3 mg/0.3 mL auto-injector Inject 0.3 mLs (0.3 mg total) into the muscle as needed for Anaphylaxis    beclomethasone (QNASL) 80 MCG/ACT nasal spray 2 sprays by Nasal route daily    fluticasone (FLOVENT DISKUS) 250 MCG/BLIST diskus inhaler Inhale 1 puff into the lungs 2 times  daily    generic DME Bilateral custom orthotics; Dx: bilateral foot pain    Non-System Medication Bilateral custom-made orthotics  Dx: bilateral foot pain, history of stroke, disp: 1pair    Calcium Carb-Cholecalciferol 600-200 MG-UNIT TABS Take 1 tablet by mouth 3 times daily    Denosumab (PROLIA SC) Inject 1 Device into the skin every 6 months    loratadine (CLARITIN) 10 mg tablet Take 10 mg by mouth daily    Multiple Vitamin (MULTIVITAMIN) per tablet Take 1 tablet by mouth daily    cholecalciferol (VITAMIN D) 1000 UNITS capsule Take 1,000 Units by mouth daily       Allergies:  Allergies   Allergen Reactions    Penicillins Hives    Sulfa Antibiotics Other (See Comments)     Serum sickness    Bactrim Other (See Comments)     Serum sickness    Phenytoin Sodium Extended Swelling and Other (See Comments)      Edema    Seasonal Allergies Other (See  Comments)     Trees,pollen,hay fever,mold          Physical Exam:    Vitals:    11/18/21 1445   BP: 163/75   Pulse: 85   Weight: 47.6 kg (105 lb)   Height: 1.6 m (5\' 3" )     General:  Appears in no acute distress and ambulates with a slightly antalgic gait favoring the right side without the aid of an assistive device.  Respiratory:  Non labored pattern.  Extremities:   Left lower extremity:  No discomfort with ankle plantarflexion, dorsiflexion, inversion or eversion, palpable dorsalis pedal pulse, brisk capillary refill, sensation intact light touch.   Right lower extremity:  No discomfort with ankle plantarflexion, dorsiflexion, inversion or eversion, distal fibula and medial malleolus nontender to percussion, no signs of foot swelling, no appreciable ecchymosis, fifth metatarsal tender to palpation, mild tenderness to palpation of the base of the fourth metatarsal and over the cuboid, palpable dorsalis pedal pulse, brisk capillary refill, sensation intact light touch.      Imaging:  Weightbearing x-rays of her right foot were obtained in the office today which revealed no acute osseous or soft tissue abnormalities.          Diagnosis:  Right foot pain, likely contusion/sprain.      Assessment/Plan:   All clinical and radiographic findings were reviewed in detail with [name] and her husband today and all of their questions and concerns were answered to their satisfaction.  She will continue to weight-bear as tolerated in comfortable shoe wear.  I recommend she elevate and use ice to help with any swelling and take Tylenol as needed for pain.  She will progress her activities as tolerated and let pain guide her function.  She will follow up with the orthopedic urgent care on an as needed basis but will contact the office with any questions or concerns.      Roosvelt Maser PA-C  [place]

## 2021-11-21 NOTE — Progress Notes (Signed)
Cardiology Office Note:    Date:  11/21/2021   ID:  Ariel Simpson, DOB [birth year], MRN [id number]  PCP:  Binnie Rail, MD  Cardiologist:  Sinclair Grooms, MD   Referring MD: Binnie Rail, MD   No chief complaint on file.   History of Present Illness:    Ariel Simpson is a 66 y.o. female with a hx of mitral valve prolapse, significant mitral regurgitation resulting in mitral valve repair in 2013, paroxysmal atrial fibrillation preceding surgery, operative Maze, remote history of PSVT, and recurring presyncope and syncope (x 1) with negative neuro evaluation but suspicious for neurally mediated, and ILR 10/2021.  ILR implanted 2022.   Doing well, no syncope, denies palpitations, no lower extremity swelling, and no recent echocardiogram evaluation.  Since loop recorder was placed, she has had no recurrence of syncope.  Past Medical History:  Diagnosis Date   Arthritis    Benign essential tremor    CHF (congestive heart failure) (HCC)    Heart murmur    High cholesterol    Hypertension    Insomnia    Mitral regurgitation    Mitral valve prolapse    Osteopenia    Paroxysmal atrial fibrillation (HCC) 03/11/2013   Paroxysmal supraventricular tachycardia (HCC)    PONV (postoperative nausea and vomiting)    Dizziness and Nausea   S/P Maze operation for atrial fibrillation 03/18/2013   Complete bilateral atrial lesion set using cryothermy and biopolar radiofrequency ablation with oversewing of LA appendage via right mini thoracotomy   S/P mitral valve repair 03/18/2013   Complex valvuloplasty including triangular resection of posterior leaflet, artificial Gore-tex neocord placement x4 and 53mm Sorin Memo 3D ring annuloplasty via right mini thoracotomy   Severe mitral regurgitation 03/03/2013   Acute on chronic with acute heart failure    Shortness of breath    Wears glasses     Past Surgical History:  Procedure Laterality Date   ABDOMINAL HYSTERECTOMY     hernia repair    BIOPSY  08/26/2019   Procedure: BIOPSY;  Surgeon: Wilford Corner, MD;  Location: [facility];  Service: Endoscopy;;   CARDIAC CATHETERIZATION     COLONOSCOPY     COLONOSCOPY WITH PROPOFOL N/A 08/26/2019   Procedure: COLONOSCOPY WITH PROPOFOL;  Surgeon: Wilford Corner, MD;  Location: [facility];  Service: Endoscopy;  Laterality: N/A;   INTRAOPERATIVE TRANSESOPHAGEAL ECHOCARDIOGRAM N/A 03/18/2013   Procedure: INTRAOPERATIVE TRANSESOPHAGEAL ECHOCARDIOGRAM;  Surgeon: Rexene Alberts, MD;  Location: [facility];  Service: Open Heart Surgery;  Laterality: N/A;   LEFT AND RIGHT HEART CATHETERIZATION WITH CORONARY ANGIOGRAM N/A 03/06/2013   Procedure: LEFT AND RIGHT HEART CATHETERIZATION WITH CORONARY ANGIOGRAM;  Surgeon: Sinclair Grooms, MD;  Location: [facility] CATH LAB;  Service: Cardiovascular;  Laterality: N/A;   MAZE N/A 03/18/2013   Procedure: MAZE;  Surgeon: Rexene Alberts, MD;  Location: [facility];  Service: Open Heart Surgery;  Laterality: N/A;   MITRAL VALVE REPAIR Right 03/18/2013   Procedure: MINIMALLY INVASIVE MITRAL VALVE REPAIR (MVR);  Surgeon: Rexene Alberts, MD;  Location: [facility];  Service: Open Heart Surgery;  Laterality: Right;   VARICOSE VEIN SURGERY     WISDOM TOOTH EXTRACTION      Current Medications: No outpatient medications have been marked as taking for the 11/23/21 encounter (Appointment) with Belva Crome, MD.     Allergies:   Patient has no known allergies.   Social History   Socioeconomic History   Marital status: Married  Spouse name: Fritz Pickerel   Number of children: 3   Years of education: BS   Highest education level: Not on file  Occupational History    Employer: [employer]  Tobacco Use   Smoking status: Never   Smokeless tobacco: Never  Vaping Use   Vaping Use: Never used  Substance and Sexual Activity   Alcohol use: Yes    Alcohol/week: 5.0 - 6.0 standard drinks    Types: 5 - 6 Glasses of wine per week    Comment: 1 drink/day   Drug use: No    Sexual activity: Yes    Birth control/protection: Surgical  Other Topics Concern   Not on file  Social History Narrative   Patient lives at home with her husband  Fritz Pickerel)  - second husband. Patient has college education B.S. 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   Caffeine- one cup daily.   Social Determinants of Health   Financial Resource Strain: Low Risk    Difficulty of Paying Living Expenses: Not hard at all  Food Insecurity: No Food Insecurity   Worried About Charity fundraiser in the Last Year: Never true   [place] in the Last Year: Never true  Transportation Needs: No Transportation Needs   Lack of Transportation (Medical): No   Lack of Transportation (Non-Medical): No  Physical Activity: Sufficiently Active   Days of Exercise per Week: 7 days   Minutes of Exercise per Session: 60 min  Stress: No Stress Concern Present   Feeling of Stress : Not at all  Social Connections: Socially Integrated   Frequency of Communication with Friends and Family: More than three times a week   Frequency of Social Gatherings with Friends and Family: Once a week   Attends Religious Services: 1 to 4 times per year   Active Member of [employer] or Organizations: Yes   Attends Archivist Meetings: 1 to 4 times per year   Marital Status: Married     Family History: The patient's family history includes Colon cancer in her paternal grandmother; Down syndrome in her child; Hypertension in her father; Pancreatic cancer in her mother; Seizures in her child; Stomach cancer in her father and paternal grandfather; Thyroid cancer in her sister.  ROS:   Please see the history of present illness.    Doing well.  Having some orthopedic issues related to to exercise and repetitive motion.  All other systems reviewed and are negative.  EKGs/Labs/Other Studies Reviewed:    The following studies were reviewed today: No new data  EKG:  EKG not repeated  Recent  Labs: 09/08/2021: ALT 18; BUN 17; Creatinine, Ser 0.90; Hemoglobin 13.2; Platelets 192.0; Potassium 4.9; Sodium 139; TSH 2.08  Recent Lipid Panel    Component Value Date/Time   CHOL 154 09/08/2021 0934   CHOL 176 08/25/2019 1134   TRIG 113.0 09/08/2021 0934   HDL 54.20 09/08/2021 0934   HDL 66 08/25/2019 1134   CHOLHDL 3 09/08/2021 0934   VLDL 22.6 09/08/2021 0934   LDLCALC 77 09/08/2021 0934   LDLCALC 95 08/25/2019 1134    Physical Exam:    VS:  There were no vitals taken for this visit.    Wt Readings from Last 3 Encounters:  09/20/21 165 lb 12.8 oz (75.2 kg)  09/08/21 165 lb 3.2 oz (74.9 kg)  04/19/21 162 lb 6.4 oz (73.7 kg)     GEN: Healthy appearing. No acute  distress HEENT: Normal NECK: No JVD. LYMPHATICS: No lymphadenopathy CARDIAC: 2/6 left sternal apical, and left maxillary systolic MR murmur. RRR no gallop, or edema. VASCULAR:  Normal Pulses. No bruits. RESPIRATORY:  Clear to auscultation without rales, wheezing or rhonchi  ABDOMEN: Soft, non-tender, non-distended, No pulsatile mass, MUSCULOSKELETAL: No deformity  SKIN: Warm and dry NEUROLOGIC:  Alert and oriented x 3 PSYCHIATRIC:  Normal affect   ASSESSMENT:    1. Paroxysmal atrial fibrillation (HCC)   2. S/P mitral valve repair   3. Other hyperlipidemia   4. Essential hypertension   5. Hyperlipidemia LDL goal <100   6. Syncope, unspecified syncope type    PLAN:    In order of problems listed above:  No recurrence of atrial fibrillation either clinically or on loop recorder. Residual mitral regurgitation.  2D Doppler echocardiogram will be done in 6 to 8 months and be ready for discussion at her 1 year follow-up Continue statin therapy Blood pressure control is excellent No recurrence.  Continue monitoring internal loop recorder.   Medication Adjustments/Labs and Tests Ordered: Current medicines are reviewed at length with the patient today.  Concerns regarding medicines are outlined above.  No  orders of the defined types were placed in this encounter.  No orders of the defined types were placed in this encounter.   There are no Patient Instructions on file for this visit.   Signed, Sinclair Grooms, MD  11/21/2021 11:52 AM    [place]

## 2021-11-22 DIAGNOSIS — M1711 Unilateral primary osteoarthritis, right knee: Secondary | ICD-10-CM | POA: Diagnosis not present

## 2021-11-23 ENCOUNTER — Encounter: Payer: Self-pay | Admitting: Interventional Cardiology

## 2021-11-23 ENCOUNTER — Ambulatory Visit: Payer: Medicare Other | Admitting: Interventional Cardiology

## 2021-11-23 ENCOUNTER — Other Ambulatory Visit: Payer: Self-pay

## 2021-11-23 VITALS — BP 118/78 | HR 60 | Ht 67.0 in | Wt 168.6 lb

## 2021-11-23 DIAGNOSIS — I10 Essential (primary) hypertension: Secondary | ICD-10-CM | POA: Diagnosis not present

## 2021-11-23 DIAGNOSIS — Z9889 Other specified postprocedural states: Secondary | ICD-10-CM

## 2021-11-23 DIAGNOSIS — E7849 Other hyperlipidemia: Secondary | ICD-10-CM | POA: Diagnosis not present

## 2021-11-23 DIAGNOSIS — I480 Paroxysmal atrial fibrillation: Secondary | ICD-10-CM

## 2021-11-23 DIAGNOSIS — E785 Hyperlipidemia, unspecified: Secondary | ICD-10-CM

## 2021-11-23 DIAGNOSIS — R55 Syncope and collapse: Secondary | ICD-10-CM

## 2021-11-23 NOTE — Patient Instructions (Signed)
Medication Instructions:  Your physician recommends that you continue on your current medications as directed. Please refer to the Current Medication list given to you today.  *If you need a refill on your cardiac medications before your next appointment, please call your pharmacy*   Lab Work: None If you have labs (blood work) drawn today and your tests are completely normal, you will receive your results only by: [place] (if you have MyChart) OR A paper copy in the mail If you have any lab test that is abnormal or we need to change your treatment, we will call you to review the results.   Testing/Procedures: Your physician has requested that you have an echocardiogram 2 months prior to seeing Dr. Tamala Julian back. Echocardiography is a painless test that uses sound waves to create images of your heart. It provides your doctor with information about the size and shape of your heart and how well your hearts chambers and valves are working. This procedure takes approximately one hour. There are no restrictions for this procedure.   Follow-Up: At [facility], you and your health needs are our priority.  As part of our continuing mission to provide you with exceptional heart care, we have created designated Provider Care Teams.  These Care Teams include your primary Cardiologist (physician) and Advanced Practice Providers (APPs -  Physician Assistants and Nurse Practitioners) who all work together to provide you with the care you need, when you need it.  We recommend signing up for the patient portal called "MyChart".  Sign up information is provided on this After Visit Summary.  MyChart is used to connect with patients for Virtual Visits (Telemedicine).  Patients are able to view lab/test results, encounter notes, upcoming appointments, etc.  Non-urgent messages can be sent to your provider as well.   To learn more about what you can do with MyChart, go to [email or web address].    Your  next appointment:   1 year(s)  The format for your next appointment:   In Person  Provider:   Sinclair Grooms, MD     Other Instructions

## 2021-11-24 DIAGNOSIS — M1711 Unilateral primary osteoarthritis, right knee: Secondary | ICD-10-CM | POA: Diagnosis not present

## 2021-11-29 DIAGNOSIS — M1711 Unilateral primary osteoarthritis, right knee: Secondary | ICD-10-CM | POA: Diagnosis not present

## 2021-12-02 DIAGNOSIS — M1711 Unilateral primary osteoarthritis, right knee: Secondary | ICD-10-CM | POA: Diagnosis not present

## 2021-12-06 ENCOUNTER — Other Ambulatory Visit: Payer: Self-pay | Admitting: Neurology

## 2021-12-06 ENCOUNTER — Ambulatory Visit (INDEPENDENT_AMBULATORY_CARE_PROVIDER_SITE_OTHER): Payer: Medicare Other

## 2021-12-06 DIAGNOSIS — M1711 Unilateral primary osteoarthritis, right knee: Secondary | ICD-10-CM | POA: Diagnosis not present

## 2021-12-06 DIAGNOSIS — R55 Syncope and collapse: Secondary | ICD-10-CM | POA: Diagnosis not present

## 2021-12-06 LAB — CUP PACEART REMOTE DEVICE CHECK
Date Time Interrogation Session: 20230117083849
Implantable Pulse Generator Implant Date: 20220531

## 2021-12-16 DIAGNOSIS — M1711 Unilateral primary osteoarthritis, right knee: Secondary | ICD-10-CM | POA: Diagnosis not present

## 2021-12-20 DIAGNOSIS — M1711 Unilateral primary osteoarthritis, right knee: Secondary | ICD-10-CM | POA: Diagnosis not present

## 2021-12-20 NOTE — Progress Notes (Signed)
Carelink Summary Report / Loop Recorder 

## 2021-12-22 DIAGNOSIS — M1711 Unilateral primary osteoarthritis, right knee: Secondary | ICD-10-CM | POA: Diagnosis not present

## 2021-12-26 DIAGNOSIS — M545 Low back pain, unspecified: Secondary | ICD-10-CM | POA: Diagnosis not present

## 2021-12-26 DIAGNOSIS — M25562 Pain in left knee: Secondary | ICD-10-CM | POA: Diagnosis not present

## 2021-12-26 DIAGNOSIS — M25561 Pain in right knee: Secondary | ICD-10-CM | POA: Diagnosis not present

## 2021-12-27 DIAGNOSIS — M1711 Unilateral primary osteoarthritis, right knee: Secondary | ICD-10-CM | POA: Diagnosis not present

## 2021-12-29 DIAGNOSIS — M1711 Unilateral primary osteoarthritis, right knee: Secondary | ICD-10-CM | POA: Diagnosis not present

## 2022-01-02 DIAGNOSIS — M1711 Unilateral primary osteoarthritis, right knee: Secondary | ICD-10-CM | POA: Diagnosis not present

## 2022-01-05 DIAGNOSIS — M1711 Unilateral primary osteoarthritis, right knee: Secondary | ICD-10-CM | POA: Diagnosis not present

## 2022-01-09 ENCOUNTER — Ambulatory Visit (INDEPENDENT_AMBULATORY_CARE_PROVIDER_SITE_OTHER): Payer: Medicare Other

## 2022-01-09 DIAGNOSIS — M1711 Unilateral primary osteoarthritis, right knee: Secondary | ICD-10-CM | POA: Diagnosis not present

## 2022-01-09 DIAGNOSIS — R55 Syncope and collapse: Secondary | ICD-10-CM | POA: Diagnosis not present

## 2022-01-09 LAB — CUP PACEART REMOTE DEVICE CHECK
Date Time Interrogation Session: 20230219231114
Implantable Pulse Generator Implant Date: 20220531

## 2022-01-12 DIAGNOSIS — M1711 Unilateral primary osteoarthritis, right knee: Secondary | ICD-10-CM | POA: Diagnosis not present

## 2022-01-16 DIAGNOSIS — M1711 Unilateral primary osteoarthritis, right knee: Secondary | ICD-10-CM | POA: Diagnosis not present

## 2022-01-16 NOTE — Progress Notes (Signed)
Carelink Summary Report / Loop Recorder 

## 2022-01-18 DIAGNOSIS — M1711 Unilateral primary osteoarthritis, right knee: Secondary | ICD-10-CM | POA: Diagnosis not present

## 2022-01-25 ENCOUNTER — Other Ambulatory Visit: Payer: Self-pay

## 2022-01-25 ENCOUNTER — Ambulatory Visit: Payer: Medicare Other | Attending: Primary Care | Admitting: Primary Care

## 2022-01-25 ENCOUNTER — Encounter: Payer: Self-pay | Admitting: Primary Care

## 2022-01-25 VITALS — BP 122/78 | HR 78 | Ht 63.0 in | Wt 105.0 lb

## 2022-01-25 DIAGNOSIS — E559 Vitamin D deficiency, unspecified: Secondary | ICD-10-CM | POA: Insufficient documentation

## 2022-01-25 DIAGNOSIS — H9190 Unspecified hearing loss, unspecified ear: Secondary | ICD-10-CM | POA: Insufficient documentation

## 2022-01-25 DIAGNOSIS — I10 Essential (primary) hypertension: Secondary | ICD-10-CM | POA: Insufficient documentation

## 2022-01-25 DIAGNOSIS — G40909 Epilepsy, unspecified, not intractable, without status epilepticus: Secondary | ICD-10-CM | POA: Insufficient documentation

## 2022-01-25 DIAGNOSIS — M79673 Pain in unspecified foot: Secondary | ICD-10-CM | POA: Insufficient documentation

## 2022-01-25 DIAGNOSIS — E7800 Pure hypercholesterolemia, unspecified: Secondary | ICD-10-CM | POA: Insufficient documentation

## 2022-01-25 MED ORDER — GENERIC DME *A*
0 refills | Status: AC
Start: 2022-01-25 — End: ?

## 2022-01-25 NOTE — Progress Notes (Signed)
Reason for Visit: Hypertension and Follow-up    Hypertension-BPs have been a little better, continues to tolerate amlodipine    Feet are still an ongoing issue.  She is interested in getting a new pair of custom orthotics.  She requests a prescription and will plan to do her own research on where to have these made    Hearing loss-in the past she had tried a hearing aid which she did not tolerate.  However that was sometime ago and with developments in hearing aids, she would like to consider trying this again.  We will put a new referral through to [facility]    IBS- continues to have intermittent abdominal pain and constipation.  This was acting up this morning as they are awaiting word on and off for they put in for a condo, with plans to downsize  Patient Active Problem List   Diagnosis Code    Reported Trauma Neck     Allergic rhinitis J30.9    Irritable bowel syndrome K58.9    Essential hypertension I10    Dysplastic Nevus I78.1    Rosacea L71.9    Osteoporosis M81.0    Hypothyroidism E03.9    Scoliosis M41.20    Urge Incontinence Of Urine N39.41    A-V Malformation Repair Supratentorial Complex     Seizure disorder G40.909    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    Ringing In The Ears (Tinnitus) H93.19    GERD (gastroesophageal reflux disease) K21.9    Pain in joint, ankle and foot M25.579    Weakness of foot R29.898    Hyperlipidemia E78.5     Current Outpatient Medications   Medication    metroNIDAZOLE (METROCREAM) 0.75 % cream    azelastine (ASTEPRO) 137 MCG/SPRAY SOLN nasal solution    carBAMazepine (TEGRETOL XR) 200 mg 12 hr tablet    carBAMazepine (CARBATROL) 100 mg 12 hr capsule    solifenacin (VESICARE) 10 mg tablet    amLODIPine (NORVASC) 5 mg tablet    levothyroxine (SYNTHROID, LEVOTHROID) 25 mcg tablet    RABEprazole (ACIPHEX) 20 MG tablet    atorvastatin (LIPITOR) 10 mg tablet    triamcinolone (KENALOG) 0.1 % ointment    beclomethasone (QNASL) 80  MCG/ACT nasal spray    fluticasone (FLOVENT DISKUS) 250 MCG/BLIST diskus inhaler    Calcium Carb-Cholecalciferol 600-200 MG-UNIT TABS    Denosumab (PROLIA SC)    loratadine (CLARITIN) 10 mg tablet    Multiple Vitamin (MULTIVITAMIN) per tablet    cholecalciferol (VITAMIN D) 1000 UNITS capsule    generic DME    econazole nitrate (SPECTAZOLE) 1 % cream    famotidine (PEPCID) 20 mg tablet    pseudoephedrine (SUDAFED) 30 mg tablet    [employer] 12 HOUR NASAL DECONGESTANT 120 MG 12 hr tablet    Non-System Medication     No current facility-administered medications for this visit.       Medication list reviewed and updated, no changes were made today    Exam: BP 122/78    Pulse 78    Ht 1.6 m ('5\' 3"'$ )    Wt 47.6 kg (105 lb)    SpO2 99%    BMI 18.60 kg/m    Well-nourished alert and conversant 66 year old lady in no acute distress.  Neck is supple without lymphadenopathy.  Tympanic membranes are clear bilaterally.  No obstructed cerumen.  Lungs are clear to auscultation bilaterally.  No wheezes rales or rhonchi.  Cardiac exam reveals a normal S1-S2 with a regular rate  and rhythm and no rubs murmurs or gallops.  Abdomen is soft with somewhat hyperactive bowel sounds.  Extremities are without edema  Reviewed flowsheets, weight is stable over the last year     A/P:  1.  Hypertension-well-controlled on amlodipine, no changes were made today.  2.  Hyperlipidemia- we will plan to check lipid panel prior to next visit.  Labs were added.  Continue atorvastatin in the meantime.  3.  Hearing loss-will refer to [facility] for an updated audiologic exam and consideration of hearing aids  4.  Foot pain and narrow feet- prescription given for new custom orthotics  5.  Labs added for next visit  Orders Placed This Encounter   Procedures    Carbamazepine, free & total    Vitamin D    TSH    CBC    Comprehensive metabolic panel    Lipid Panel (Reflex to Direct  LDL if Triglycerides more than 400)    AMB REFERRAL TO AUDIOLOGY -  [facility]

## 2022-02-06 DIAGNOSIS — L723 Sebaceous cyst: Secondary | ICD-10-CM | POA: Diagnosis not present

## 2022-02-06 DIAGNOSIS — L821 Other seborrheic keratosis: Secondary | ICD-10-CM | POA: Diagnosis not present

## 2022-02-06 DIAGNOSIS — L738 Other specified follicular disorders: Secondary | ICD-10-CM | POA: Diagnosis not present

## 2022-02-06 DIAGNOSIS — L570 Actinic keratosis: Secondary | ICD-10-CM | POA: Diagnosis not present

## 2022-02-06 DIAGNOSIS — D1801 Hemangioma of skin and subcutaneous tissue: Secondary | ICD-10-CM | POA: Diagnosis not present

## 2022-02-06 DIAGNOSIS — M545 Low back pain, unspecified: Secondary | ICD-10-CM | POA: Diagnosis not present

## 2022-02-06 DIAGNOSIS — D225 Melanocytic nevi of trunk: Secondary | ICD-10-CM | POA: Diagnosis not present

## 2022-02-06 DIAGNOSIS — L814 Other melanin hyperpigmentation: Secondary | ICD-10-CM | POA: Diagnosis not present

## 2022-02-07 ENCOUNTER — Encounter: Payer: Self-pay | Admitting: Internal Medicine

## 2022-02-11 DIAGNOSIS — M545 Low back pain, unspecified: Secondary | ICD-10-CM | POA: Diagnosis not present

## 2022-02-13 ENCOUNTER — Ambulatory Visit (INDEPENDENT_AMBULATORY_CARE_PROVIDER_SITE_OTHER): Payer: Medicare Other

## 2022-02-13 DIAGNOSIS — R55 Syncope and collapse: Secondary | ICD-10-CM | POA: Diagnosis not present

## 2022-02-14 LAB — CUP PACEART REMOTE DEVICE CHECK
Date Time Interrogation Session: 20230326230530
Implantable Pulse Generator Implant Date: 20220531

## 2022-02-23 NOTE — Progress Notes (Signed)
Carelink Summary Report / Loop Recorder 

## 2022-02-27 DIAGNOSIS — H01001 Unspecified blepharitis right upper eyelid: Secondary | ICD-10-CM | POA: Diagnosis not present

## 2022-02-27 DIAGNOSIS — I10 Essential (primary) hypertension: Secondary | ICD-10-CM | POA: Diagnosis not present

## 2022-02-27 DIAGNOSIS — Z0130 Encounter for examination of blood pressure without abnormal findings: Secondary | ICD-10-CM | POA: Diagnosis not present

## 2022-02-28 ENCOUNTER — Other Ambulatory Visit: Payer: Self-pay | Admitting: Primary Care

## 2022-02-28 DIAGNOSIS — E785 Hyperlipidemia, unspecified: Secondary | ICD-10-CM

## 2022-03-02 ENCOUNTER — Other Ambulatory Visit: Payer: Self-pay | Admitting: Primary Care

## 2022-03-02 DIAGNOSIS — E785 Hyperlipidemia, unspecified: Secondary | ICD-10-CM

## 2022-03-10 DIAGNOSIS — H0100A Unspecified blepharitis right eye, upper and lower eyelids: Secondary | ICD-10-CM | POA: Diagnosis not present

## 2022-03-10 DIAGNOSIS — H04211 Epiphora due to excess lacrimation, right lacrimal gland: Secondary | ICD-10-CM | POA: Diagnosis not present

## 2022-03-10 DIAGNOSIS — Z1231 Encounter for screening mammogram for malignant neoplasm of breast: Secondary | ICD-10-CM | POA: Diagnosis not present

## 2022-03-10 LAB — HM MAMMOGRAPHY

## 2022-03-13 DIAGNOSIS — M545 Low back pain, unspecified: Secondary | ICD-10-CM | POA: Diagnosis not present

## 2022-03-16 ENCOUNTER — Encounter: Payer: Self-pay | Admitting: Internal Medicine

## 2022-03-16 NOTE — Progress Notes (Signed)
Outside notes received. Information abstracted. Notes sent to scan.  

## 2022-03-19 LAB — CUP PACEART REMOTE DEVICE CHECK
Date Time Interrogation Session: 20230428230124
Implantable Pulse Generator Implant Date: 20220531

## 2022-03-20 ENCOUNTER — Ambulatory Visit (INDEPENDENT_AMBULATORY_CARE_PROVIDER_SITE_OTHER): Payer: Medicare Other

## 2022-03-20 DIAGNOSIS — R55 Syncope and collapse: Secondary | ICD-10-CM | POA: Diagnosis not present

## 2022-03-23 ENCOUNTER — Other Ambulatory Visit
Admission: RE | Admit: 2022-03-23 | Discharge: 2022-03-23 | Disposition: A | Discharge status: Home / Self Care | Payer: Medicare Other | Source: Ambulatory Visit | Attending: Primary Care | Admitting: Primary Care

## 2022-03-23 ENCOUNTER — Other Ambulatory Visit: Payer: Self-pay | Admitting: Primary Care

## 2022-03-23 DIAGNOSIS — E559 Vitamin D deficiency, unspecified: Secondary | ICD-10-CM | POA: Insufficient documentation

## 2022-03-23 DIAGNOSIS — E7800 Pure hypercholesterolemia, unspecified: Secondary | ICD-10-CM | POA: Insufficient documentation

## 2022-03-23 DIAGNOSIS — D72819 Decreased white blood cell count, unspecified: Secondary | ICD-10-CM

## 2022-03-23 DIAGNOSIS — G40909 Epilepsy, unspecified, not intractable, without status epilepticus: Secondary | ICD-10-CM | POA: Insufficient documentation

## 2022-03-23 LAB — COMPREHENSIVE METABOLIC PANEL
ALT: 35 U/L (ref 0–35)
AST: 24 U/L (ref 0–35)
Albumin: 4.7 g/dL (ref 3.5–5.2)
Alk Phos: 65 U/L (ref 35–105)
Anion Gap: 10 (ref 7–16)
Bilirubin,Total: 0.4 mg/dL (ref 0.0–1.2)
CO2: 29 mmol/L — ABNORMAL HIGH (ref 20–28)
Calcium: 9.5 mg/dL (ref 8.6–10.2)
Chloride: 99 mmol/L (ref 96–108)
Creatinine: 0.55 mg/dL (ref 0.51–0.95)
Glucose: 96 mg/dL (ref 60–99)
Lab: 15 mg/dL (ref 6–20)
Potassium: 4.5 mmol/L (ref 3.3–5.1)
Sodium: 138 mmol/L (ref 133–145)
Total Protein: 6.6 g/dL (ref 6.3–7.7)
eGFR BY CREAT: 100 *

## 2022-03-23 LAB — CBC AND DIFFERENTIAL
Baso # K/uL: 0 10*3/uL (ref 0.0–0.1)
Basophil %: 0.3 %
Eos # K/uL: 0 10*3/uL (ref 0.0–0.4)
Eosinophil %: 1.1 %
Hematocrit: 39 % (ref 34–45)
Hemoglobin: 12.5 g/dL (ref 11.2–15.7)
IMM Granulocytes #: 0 10*3/uL (ref 0.0–0.0)
IMM Granulocytes: 0.3 %
Lymph # K/uL: 1.2 10*3/uL (ref 1.2–3.7)
Lymphocyte %: 33.2 %
MCH: 32 pg (ref 26–32)
MCHC: 32 g/dL (ref 32–36)
MCV: 100 fL — ABNORMAL HIGH (ref 79–95)
Mono # K/uL: 0.4 10*3/uL (ref 0.2–0.9)
Monocyte %: 10.6 %
Neut # K/uL: 2 10*3/uL (ref 1.6–6.1)
Nucl RBC # K/uL: 0 10*3/uL (ref 0.0–0.0)
Nucl RBC %: 0 /100 WBC (ref 0.0–0.2)
Platelets: 222 10*3/uL (ref 160–370)
RBC: 3.9 MIL/uL (ref 3.9–5.2)
RDW: 13.2 % (ref 11.7–14.4)
Seg Neut %: 54.5 %
WBC: 3.6 10*3/uL — ABNORMAL LOW (ref 4.0–10.0)

## 2022-03-23 LAB — LIPID PANEL
Chol/HDL Ratio: 1.7
Cholesterol: 221 mg/dL — AB
HDL: 130 mg/dL — ABNORMAL HIGH (ref 40–60)
LDL Calculated: 79 mg/dL
Non HDL Cholesterol: 91 mg/dL
Triglycerides: 60 mg/dL

## 2022-03-23 LAB — TSH: TSH: 1.99 u[IU]/mL (ref 0.27–4.20)

## 2022-03-23 LAB — VITAMIN D: 25-OH Vit Total: 46 ng/mL (ref 30–60)

## 2022-03-24 ENCOUNTER — Telehealth: Payer: Self-pay | Admitting: Primary Care

## 2022-03-24 NOTE — Telephone Encounter (Signed)
Called patient to advise her of what Dr. Elmer Bales stated in the previous note. Patient states she understands and feel reassured.

## 2022-03-24 NOTE — Telephone Encounter (Signed)
Patient called regarding blood work that she had. She would like to know if there is anything to be concerned about with her WBC count.

## 2022-03-24 NOTE — Telephone Encounter (Signed)
Nothing to worry about, I added a differential to the CBC to look at the breakdown of all the white blood cells, and all was normal. So nothing to be concerned about

## 2022-03-26 LAB — CARBAMAZEPINE, FREE & TOTAL
Carbamazepine, Free: 18.6 % (ref 8.0–35.0)
Carbamazepine,Free: 1.9 ug/mL (ref 1.0–3.0)
Carbamazepine,Total: 10.2 ug/mL (ref 4.0–12.0)

## 2022-03-27 DIAGNOSIS — H0100A Unspecified blepharitis right eye, upper and lower eyelids: Secondary | ICD-10-CM | POA: Diagnosis not present

## 2022-03-27 DIAGNOSIS — H10413 Chronic giant papillary conjunctivitis, bilateral: Secondary | ICD-10-CM | POA: Diagnosis not present

## 2022-04-04 NOTE — Progress Notes (Signed)
Carelink Summary Report / Loop Recorder 

## 2022-04-06 NOTE — Progress Notes (Signed)
Virtual Visit via Video Note  I connected with Ariel Simpson on 04/06/22 at  8:50 AM EDT by a video enabled telemedicine application and verified that I am speaking with the correct person using two identifiers.   I discussed the limitations of evaluation and management by telemedicine and the availability of in person appointments. The patient expressed understanding and agreed to proceed.  Present for the visit:  Myself, Dr Billey Gosling, Wilber Bihari.  The patient is currently at home and I am in the office.    No referring provider.    History of Present Illness: This is an acute visit to discuss antibiotic for chronic eye problem.  She has been having issues for about 2 months with her right eye.  She is following with her ophthalmologist.  The right eye has been tearing a lot.  Also approximately 2 months ago she developed a stye and she was doing warm compressions regularly and it ended up draining.  She then developed some conjunctival erythema on the lateral and inferior eye.  It does not itch or hurt.  At times it does feel like something is in there.  Early in May the eye doctor prescribed her a Z-Pak which may have helped minimally.  He wanted her to repeat a Z-Pak beginning of June, but she was little concerned about taking another antibiotic and wanted to get my opinion.   Social History   Socioeconomic History   Marital status: Married    Spouse name: Fritz Pickerel   Number of children: 3   Years of education: BS   Highest education level: Not on file  Occupational History    Employer: [employer]  Tobacco Use   Smoking status: Never   Smokeless tobacco: Never  Vaping Use   Vaping Use: Never used  Substance and Sexual Activity   Alcohol use: Yes    Alcohol/week: 5.0 - 6.0 standard drinks    Types: 5 - 6 Glasses of wine per week    Comment: 1 drink/day   Drug use: No   Sexual activity: Yes    Birth control/protection: Surgical  Other Topics Concern   Not  on file  Social History Narrative   Patient lives at home with her husband  Fritz Pickerel)  - second husband. Patient has college education B.S. 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   Caffeine- one cup daily.   Social Determinants of Health   Financial Resource Strain: Low Risk    Difficulty of Paying Living Expenses: Not hard at all  Food Insecurity: No Food Insecurity   Worried About Charity fundraiser in the Last Year: Never true   [place] in the Last Year: Never true  Transportation Needs: No Transportation Needs   Lack of Transportation (Medical): No   Lack of Transportation (Non-Medical): No  Physical Activity: Sufficiently Active   Days of Exercise per Week: 7 days   Minutes of Exercise per Session: 60 min  Stress: No Stress Concern Present   Feeling of Stress : Not at all  Social Connections: Socially Integrated   Frequency of Communication with Friends and Family: More than three times a week   Frequency of Social Gatherings with Friends and Family: Once a week   Attends Religious Services: 1 to 4 times per year   Active Member of [employer] or Organizations: Yes   Attends Archivist Meetings: 1 to 4 times per year  Marital Status: Married     Observations/Objective: Appears well in NAD Mild erythema right lateral and right lateral-lower conjunctival.  No active discharge  Assessment and Plan:  See Problem List for Assessment and Plan of chronic medical problems.   Follow Up Instructions:    I discussed the assessment and treatment plan with the patient. The patient was provided an opportunity to ask questions and all were answered. The patient agreed with the plan and demonstrated an understanding of the instructions.   The patient was advised to call back or seek an in-person evaluation if the symptoms worsen or if the condition fails to improve as anticipated.    Binnie Rail, MD

## 2022-04-07 ENCOUNTER — Telehealth (INDEPENDENT_AMBULATORY_CARE_PROVIDER_SITE_OTHER): Payer: Medicare Other | Admitting: Internal Medicine

## 2022-04-07 ENCOUNTER — Encounter: Payer: Self-pay | Admitting: Internal Medicine

## 2022-04-07 DIAGNOSIS — H109 Unspecified conjunctivitis: Secondary | ICD-10-CM | POA: Diagnosis not present

## 2022-04-07 NOTE — Assessment & Plan Note (Signed)
New problem She has had erythema of her right lateral and lower conjunctiva and is following with her eye doctor for this ?  Related to dry eyes versus conjunctivitis for many possible reasons Discussed that unfortunately I am not able to help too much with this problem Did discuss her taking another round of Z-Pak to see if that helps versus other options

## 2022-04-20 ENCOUNTER — Ambulatory Visit
Admission: RE | Admit: 2022-04-20 | Discharge: 2022-04-20 | Disposition: A | Discharge status: Home / Self Care | Payer: Medicare Other | Source: Ambulatory Visit

## 2022-04-20 ENCOUNTER — Ambulatory Visit: Payer: Medicare Other | Attending: Orthopedic Surgery | Admitting: Orthopedic Surgery

## 2022-04-20 ENCOUNTER — Encounter: Payer: Self-pay | Admitting: Orthopedic Surgery

## 2022-04-20 ENCOUNTER — Other Ambulatory Visit: Payer: Self-pay

## 2022-04-20 ENCOUNTER — Other Ambulatory Visit: Payer: Self-pay | Admitting: Internal Medicine

## 2022-04-20 VITALS — BP 151/72 | HR 80 | Ht 63.0 in | Wt 108.0 lb

## 2022-04-20 DIAGNOSIS — M85871 Other specified disorders of bone density and structure, right ankle and foot: Secondary | ICD-10-CM

## 2022-04-20 DIAGNOSIS — S8001XA Contusion of right knee, initial encounter: Secondary | ICD-10-CM | POA: Insufficient documentation

## 2022-04-20 DIAGNOSIS — M85861 Other specified disorders of bone density and structure, right lower leg: Secondary | ICD-10-CM

## 2022-04-20 DIAGNOSIS — M19071 Primary osteoarthritis, right ankle and foot: Secondary | ICD-10-CM | POA: Insufficient documentation

## 2022-04-20 DIAGNOSIS — M1711 Unilateral primary osteoarthritis, right knee: Secondary | ICD-10-CM

## 2022-04-20 DIAGNOSIS — M25461 Effusion, right knee: Secondary | ICD-10-CM

## 2022-04-20 NOTE — Progress Notes (Signed)
CHIEF COMPLAINT: Right knee pain and right foot pain    HISTORY OF PRESENT ILLNESS: Patient is a 66 year old female who presents today for urgent evaluation of pain in the right knee and right foot.  She states yesterday she missed a step and fell striking the anterior aspect of her right knee on the stair.  She also noted some pain through the midfoot.  She has noted some interval improvement since yesterday, however presents today for urgent evaluation.  She describes pain with extremes of flexion as well as stiffness primarily in the knee.    PAST MEDICAL HISTORY: Hypertension, rosacea, hypothyroidism, scoliosis    PAST SURGICAL HISTORY: See list     MEDICATIONS:  See Medication List    ALLERGIES:  See Allergy List    SOCIAL HISTORY: She is married    FAMILY HISTORY: Noncontributory    REVIEW OF SYSTEMS:  Review of Gastointestinal, Genitourinary, Neurologic, Integument, Vascular, Hematologic, Lymphatic, Cardiac, Pulmonary and Endocrine systems reveal the following: Osteoporosis    PHYSICAL EXAM: Thin female.  Her right knee shows a small area of soft tissue swelling and ecchymosis near the tibial tubercle.  There is some mild tenderness in that area.  She has no effusion of the knee.  No increased warmth or erythema.  She can actively extend her knee to 0 degrees.  Flexion is to 110 degrees with some anterior pain with further attempts at flexion.  No instability or pain with varus valgus stress.  Good motion about the right hip without reproduction of pain.    Her right foot shows no soft tissue swelling.  There is some mild soft tissue swelling near the distal MTP joints numbered 3 through 5.  Minimal tenderness to palpation in that area.  No plantar ecchymosis.  Nontender through the calcaneus.  Good range of motion and strength through the right ankle.  Calf is soft and nontender.  CMS checks are grossly intact distally.    IMAGING: I did order x-rays of the right knee and right foot.  These do not show any  obvious acute bony abnormalities.  No significant degenerative change noted at the right knee.  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                        ASSESSMENT AND DIAGNOSIS: Right knee contusion with aggravation of midfoot arthritis    PLAN: She was encouraged to use a cane to assist with normalizing her gait.  She will continue with anti-inflammatories/Tylenol as needed.  I would expect her symptoms to improve as time passes.  She will contact me via MyChart with problems or questions.    ORDERS TODAY: OA series right knee, 3 views right foot, weightbearing    ORDERS NEXT VISIT:    PERCENT OF TEMPORARY IMPAIRMENT:

## 2022-04-22 ENCOUNTER — Other Ambulatory Visit: Payer: Self-pay | Admitting: Primary Care

## 2022-04-24 ENCOUNTER — Ambulatory Visit (INDEPENDENT_AMBULATORY_CARE_PROVIDER_SITE_OTHER): Payer: Medicare Other

## 2022-04-24 DIAGNOSIS — R55 Syncope and collapse: Secondary | ICD-10-CM | POA: Diagnosis not present

## 2022-04-24 DIAGNOSIS — H0100B Unspecified blepharitis left eye, upper and lower eyelids: Secondary | ICD-10-CM | POA: Diagnosis not present

## 2022-04-24 DIAGNOSIS — H16223 Keratoconjunctivitis sicca, not specified as Sjogren's, bilateral: Secondary | ICD-10-CM | POA: Diagnosis not present

## 2022-04-24 DIAGNOSIS — H02403 Unspecified ptosis of bilateral eyelids: Secondary | ICD-10-CM | POA: Diagnosis not present

## 2022-04-24 DIAGNOSIS — H0100A Unspecified blepharitis right eye, upper and lower eyelids: Secondary | ICD-10-CM | POA: Diagnosis not present

## 2022-04-24 LAB — CUP PACEART REMOTE DEVICE CHECK
Date Time Interrogation Session: 20230531230836
Implantable Pulse Generator Implant Date: 20220531

## 2022-04-26 ENCOUNTER — Other Ambulatory Visit: Payer: Self-pay | Admitting: Primary Care

## 2022-04-26 MED ORDER — FLUTICASONE PROPIONATE (INHAL) 250 MCG/BLIST IN AEPB *I*
1.0000 | INHALATION_SPRAY | Freq: Two times a day (BID) | RESPIRATORY_TRACT | 1 refills | Status: DC
Start: 2022-04-26 — End: 2023-07-26

## 2022-04-26 NOTE — Telephone Encounter (Signed)
Patient requesting her Flovent, having some breathing issues due to the smoke and air quality

## 2022-05-04 ENCOUNTER — Other Ambulatory Visit: Payer: Self-pay

## 2022-05-04 ENCOUNTER — Ambulatory Visit: Payer: Medicare Other | Attending: Primary Care | Admitting: Primary Care

## 2022-05-04 ENCOUNTER — Encounter: Payer: Self-pay | Admitting: Primary Care

## 2022-05-04 VITALS — BP 134/80 | HR 71 | Ht 63.0 in | Wt 105.2 lb

## 2022-05-04 DIAGNOSIS — D72819 Decreased white blood cell count, unspecified: Secondary | ICD-10-CM | POA: Insufficient documentation

## 2022-05-04 DIAGNOSIS — I10 Essential (primary) hypertension: Secondary | ICD-10-CM | POA: Insufficient documentation

## 2022-05-04 DIAGNOSIS — E559 Vitamin D deficiency, unspecified: Secondary | ICD-10-CM | POA: Insufficient documentation

## 2022-05-04 DIAGNOSIS — M79604 Pain in right leg: Secondary | ICD-10-CM | POA: Insufficient documentation

## 2022-05-04 DIAGNOSIS — E034 Atrophy of thyroid (acquired): Secondary | ICD-10-CM | POA: Insufficient documentation

## 2022-05-04 DIAGNOSIS — G40909 Epilepsy, unspecified, not intractable, without status epilepticus: Secondary | ICD-10-CM | POA: Insufficient documentation

## 2022-05-04 NOTE — Progress Notes (Signed)
Reason for Visit: Follow-up     A couple weeks ago, she fell walking up one step without a support.  She usually uses Homero Fellers for support, but didn't quite have his arm when she stepped up and ended up falling down, hyperflexing her toes on the right foot and coming down on her right knee and leg.  Homero Fellers broke the fall for her as best he could  She has had extensive bruising and swelling.  She was seen in urgent ortho and xrays revealed no fractures    Her chiropracter working with her on laser therapy, swelling is improving, bruising is resolving  Sitting she has not pain, no pain at night  The main source of pain is quick sudden movements while weight bearing    She has not yet found someone who can make her the custom orthotic    Allergies have not been too bad  Ears fill up   Usually in the afternoon, her eyes become light sensitive  Hard to keep them open  She does use the astelin, flonase and takes sudafed to help with this    Stomach is generally stable, more on the constipated side    They are downsizing to a condo in 2-3 months    Patient Active Problem List   Diagnosis Code   . Reported Trauma Neck    . Allergic rhinitis J30.9   . Irritable bowel syndrome K58.9   . Essential hypertension I10   . Dysplastic Nevus I78.1   . Rosacea L71.9   . Osteoporosis M81.0   . Hypothyroidism E03.9   . Scoliosis M41.20   . Urge Incontinence Of Urine N39.41   . A-V Malformation Repair Supratentorial Complex    . Seizure disorder G40.909   . 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 Ringing In The Ears (Tinnitus) H93.19   . GERD (gastroesophageal reflux disease) K21.9   . Pain in joint, ankle and foot M25.579   . Weakness of foot R29.898   . Hyperlipidemia E78.5     Current Outpatient Medications   Medication   . fluticasone (FLOVENT DISKUS) 250 MCG/ACT diskus inhaler   . RABEprazole (ACIPHEX) 20 MG tablet   . atorvastatin (LIPITOR) 10 mg tablet   . generic DME   . metroNIDAZOLE (METROCREAM) 0.75 % cream   .  azelastine (ASTEPRO) 137 MCG/SPRAY SOLN nasal solution   . carBAMazepine (TEGRETOL XR) 200 mg 12 hr tablet   . carBAMazepine (CARBATROL) 100 mg 12 hr capsule   . solifenacin (VESICARE) 10 mg tablet   . amLODIPine (NORVASC) 5 mg tablet   . levothyroxine (SYNTHROID, LEVOTHROID) 25 mcg tablet   . econazole nitrate (SPECTAZOLE) 1 % cream   . famotidine (PEPCID) 20 mg tablet   . [facility] 12 HOUR NASAL DECONGESTANT 120 MG 12 hr tablet   . beclomethasone (QNASL) 80 MCG/ACT nasal spray   . Non-System Medication   . Calcium Carb-Cholecalciferol 600-200 MG-UNIT TABS   . Denosumab (PROLIA SC)   . loratadine (CLARITIN) 10 mg tablet   . Multiple Vitamin (MULTIVITAMIN) per tablet   . cholecalciferol (VITAMIN D) 1000 UNITS capsule   . pseudoephedrine (SUDAFED) 30 mg tablet   . triamcinolone (KENALOG) 0.1 % ointment     No current facility-administered medications for this visit.       Medication list reviewed and updated, no changes were made today    Exam: BP 134/80   Pulse 71   Ht 1.6 m (5\' 3" )   Wt 47.7  kg (105 lb 3.2 oz)   SpO2 95%   BMI 18.64 kg/m   Thin 66 yo in nad  Lungs are CTA bilaterally, resp easy.  Cardiac exam reveals nl S1 S2 RRR  Abdomen is soft nt nl BS  Right knee with full ROM no apparent ligamental laxity  There is an effusion mainly over the distal aspect of the joint  No apparent popliteal cyst  Faint ecchymosis and soft tissue tenderness along the lateral aspect of the proximal 1/3 of the lower leg  Lateral ankle is also swollen as is the distal anterior foot  Soft tissue swelling and tenderness, but no clear frank bony tenderness    A/P:  Anderson was seen today for follow-up.    Diagnoses and all orders for this visit:    Right leg injury--appears to be more soft tissue injury   Would hold on repeating imaging at present  Discussed elevation and ice, which I would recommend a couple times per day  Continue prolia for osteoporosis      Hypothyroidism due to acquired atrophy of thyroid--continue  levothyroxine  -     Comprehensive metabolic panel; Future  -     TSH; Future    Essential hypertension--continue amlodipine  -     Comprehensive metabolic panel; Future  -     Lipid Panel (Reflex to Direct  LDL if Triglycerides more than 400); Future    Seizure disorder--levels remain stable  -     Carbamazepine, free & total; Future    Leukopenia, unspecified type--reassured her that diff was normal, will recheck in three months  -     CBC and differential; Future    Vitamin D deficiency  -     Vitamin D; Future      50 min personally spent on the date of the encounter including face to face pre and post visit work

## 2022-05-11 NOTE — Progress Notes (Signed)
Carelink Summary Report / Loop Recorder 

## 2022-05-17 ENCOUNTER — Encounter: Payer: Self-pay | Admitting: Gastroenterology

## 2022-05-25 ENCOUNTER — Ambulatory Visit: Payer: Medicare Other | Attending: Neurology | Admitting: Neurology

## 2022-05-25 ENCOUNTER — Other Ambulatory Visit: Payer: Self-pay

## 2022-05-25 ENCOUNTER — Encounter: Payer: Self-pay | Admitting: Neurology

## 2022-05-25 VITALS — BP 134/66 | HR 74 | Temp 97.9°F | Wt 101.0 lb

## 2022-05-25 DIAGNOSIS — M25569 Pain in unspecified knee: Secondary | ICD-10-CM | POA: Insufficient documentation

## 2022-05-25 NOTE — Progress Notes (Signed)
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    She has been doing relatively well since our last visit last fall.  Recently however she did fall while she was going up a stair.  She lost her balance and fell gently to the ground, aided by her husband.  She did not strike her head.  However she did injure her right knee and was seen by urgent care orthopedics who found no evidence of fracture.    She continues to have some difficulty with her gait and concerns about her balance.    She has had no recent seizures.  She has been healthy since her last visit.  Both she and her husband still observe fairly strict COVID precautions as he is immunocompromised.  Her blood pressure has been a bit high at home but in general within range.  Amlodipine was increased to 5 mg daily relatively recently.    Review of systems is otherwise noncontributory.  Examination:  Vitals:    05/25/22 0901   BP: 134/66   Pulse: 74   Temp: 36.6 C (97.9 F)   Weight: 45.8 kg (101 lb)     She is alert and oriented.  His speech is clear.  Comprehension is intact.  Affect is appropriate.    Cranial nerves visual fields are full to confrontation.  Versions are full without nystagmus.  Facial strength was intact.  Motor examination reveals full strength throughout.  She has a bilateral sustention tremor which may be a bit more prominent today than during her last visit.  It does interfere modestly with activities around the house.  Her gait remains unsteady as she is very tentative and walks with a mild degree of antalgia.    Impression overall stable status post AVM resection with residual seizures.  Her last carbamazepine level was in the therapeutic range.  She remains on 500 mg twice daily.    I have arranged for a repeat physical therapy evaluation at [facility] therapy, where she has received therapy  before.  I plan on seeing her again in 6 months.  No other change in medications.

## 2022-05-25 NOTE — Patient Instructions (Signed)
Please reach out to the clinic with any additional questions or concerns. We encourage you to sign up and use MyChart for any non-urgent medical questions as MyChart is the preferred method of communication.  Please contact your pharmacy for medication refills and allow up to 2 business days for routine prescription refills.  For urgent messages, please call the clinic at [phone].  The clinic is open from 8am to 4:30pm Monday through Friday.    You can get [facility] appointment reminders by text - please make sure we have your cell phone number on file at check in/check-out.  Then you can just text [facility] to [phone] to receive appointment messages.       No change in medications  Will undergo PT eval and treatment at [facility]  Follow up in 6 months

## 2022-05-28 LAB — CUP PACEART REMOTE DEVICE CHECK
Date Time Interrogation Session: 20230703230846
Implantable Pulse Generator Implant Date: 20220531

## 2022-05-29 ENCOUNTER — Other Ambulatory Visit: Payer: Self-pay | Admitting: Primary Care

## 2022-05-29 ENCOUNTER — Other Ambulatory Visit
Admission: RE | Admit: 2022-05-29 | Discharge: 2022-05-29 | Disposition: A | Discharge status: Home / Self Care | Payer: Medicare Other | Source: Ambulatory Visit | Attending: Obstetrics | Admitting: Obstetrics

## 2022-05-29 ENCOUNTER — Ambulatory Visit (INDEPENDENT_AMBULATORY_CARE_PROVIDER_SITE_OTHER): Payer: Medicare Other

## 2022-05-29 DIAGNOSIS — R55 Syncope and collapse: Secondary | ICD-10-CM | POA: Diagnosis not present

## 2022-05-29 DIAGNOSIS — M810 Age-related osteoporosis without current pathological fracture: Secondary | ICD-10-CM | POA: Insufficient documentation

## 2022-05-29 LAB — BASIC METABOLIC PANEL
Anion Gap: 9 (ref 7–16)
CO2: 30 mmol/L — ABNORMAL HIGH (ref 20–28)
Calcium: 9.9 mg/dL (ref 8.6–10.2)
Chloride: 99 mmol/L (ref 96–108)
Creatinine: 0.56 mg/dL (ref 0.51–0.95)
Glucose: 91 mg/dL (ref 60–99)
Lab: 19 mg/dL (ref 6–20)
Potassium: 5 mmol/L (ref 3.3–5.1)
Sodium: 138 mmol/L (ref 133–145)
eGFR BY CREAT: 99 *

## 2022-05-29 LAB — MAGNESIUM: Magnesium: 2.1 mg/dL (ref 1.6–2.5)

## 2022-06-07 DIAGNOSIS — Z8679 Personal history of other diseases of the circulatory system: Secondary | ICD-10-CM | POA: Diagnosis not present

## 2022-06-07 DIAGNOSIS — I341 Nonrheumatic mitral (valve) prolapse: Secondary | ICD-10-CM | POA: Diagnosis not present

## 2022-06-07 DIAGNOSIS — I480 Paroxysmal atrial fibrillation: Secondary | ICD-10-CM | POA: Diagnosis not present

## 2022-06-07 DIAGNOSIS — Z9889 Other specified postprocedural states: Secondary | ICD-10-CM | POA: Diagnosis not present

## 2022-06-07 DIAGNOSIS — I10 Essential (primary) hypertension: Secondary | ICD-10-CM | POA: Diagnosis not present

## 2022-06-08 ENCOUNTER — Other Ambulatory Visit: Payer: Self-pay | Admitting: Primary Care

## 2022-06-26 ENCOUNTER — Other Ambulatory Visit: Payer: Self-pay | Admitting: Primary Care

## 2022-06-26 ENCOUNTER — Other Ambulatory Visit
Admission: RE | Admit: 2022-06-26 | Discharge: 2022-06-26 | Disposition: A | Discharge status: Home / Self Care | Payer: Medicare Other | Source: Ambulatory Visit | Attending: Primary Care | Admitting: Primary Care

## 2022-06-26 DIAGNOSIS — J029 Acute pharyngitis, unspecified: Secondary | ICD-10-CM

## 2022-06-26 DIAGNOSIS — Z20828 Contact with and (suspected) exposure to other viral communicable diseases: Secondary | ICD-10-CM | POA: Insufficient documentation

## 2022-06-26 DIAGNOSIS — Z20822 Contact with and (suspected) exposure to covid-19: Secondary | ICD-10-CM | POA: Insufficient documentation

## 2022-06-26 LAB — DATE/TIME NOT PROVIDED

## 2022-06-27 LAB — COVID/INFLUENZA A & B/RSV NAAT (PCR)
COVID-19 NAAT (PCR): NEGATIVE
Influenza A NAAT (PCR): NEGATIVE
Influenza B NAAT (PCR): NEGATIVE
RSV NAAT (PCR): NEGATIVE

## 2022-06-28 NOTE — Progress Notes (Signed)
Carelink Summary Report / Loop Recorder 

## 2022-06-29 LAB — CUP PACEART REMOTE DEVICE CHECK
Date Time Interrogation Session: 20230805230749
Implantable Pulse Generator Implant Date: 20220531

## 2022-07-09 ENCOUNTER — Other Ambulatory Visit: Payer: Self-pay | Admitting: Primary Care

## 2022-07-14 ENCOUNTER — Other Ambulatory Visit: Payer: Self-pay | Admitting: *Deleted

## 2022-07-14 MED ORDER — METOPROLOL SUCCINATE ER 25 MG PO TB24: ORAL_TABLET | ORAL | 1 refills | Status: DC

## 2022-07-25 ENCOUNTER — Other Ambulatory Visit: Payer: Self-pay | Admitting: Gastroenterology

## 2022-07-25 LAB — HM DEXA SCAN

## 2022-07-31 ENCOUNTER — Telehealth: Payer: Self-pay | Admitting: Primary Care

## 2022-07-31 ENCOUNTER — Other Ambulatory Visit
Admission: RE | Admit: 2022-07-31 | Discharge: 2022-07-31 | Disposition: A | Discharge status: Home / Self Care | Payer: Medicare Other | Source: Ambulatory Visit | Attending: Primary Care | Admitting: Primary Care

## 2022-07-31 DIAGNOSIS — G40909 Epilepsy, unspecified, not intractable, without status epilepticus: Secondary | ICD-10-CM | POA: Insufficient documentation

## 2022-07-31 DIAGNOSIS — E034 Atrophy of thyroid (acquired): Secondary | ICD-10-CM | POA: Insufficient documentation

## 2022-07-31 DIAGNOSIS — E559 Vitamin D deficiency, unspecified: Secondary | ICD-10-CM | POA: Insufficient documentation

## 2022-07-31 DIAGNOSIS — D72819 Decreased white blood cell count, unspecified: Secondary | ICD-10-CM | POA: Insufficient documentation

## 2022-07-31 DIAGNOSIS — I10 Essential (primary) hypertension: Secondary | ICD-10-CM | POA: Insufficient documentation

## 2022-07-31 LAB — COMPREHENSIVE METABOLIC PANEL
ALT: 36 U/L — ABNORMAL HIGH (ref 0–35)
AST: 23 U/L (ref 0–35)
Albumin: 4.7 g/dL (ref 3.5–5.2)
Alk Phos: 61 U/L (ref 35–105)
Anion Gap: 9 (ref 7–16)
Bilirubin,Total: 0.3 mg/dL (ref 0.0–1.2)
CO2: 29 mmol/L — ABNORMAL HIGH (ref 20–28)
Calcium: 9.6 mg/dL (ref 8.6–10.2)
Chloride: 100 mmol/L (ref 96–108)
Creatinine: 0.63 mg/dL (ref 0.51–0.95)
Glucose: 98 mg/dL (ref 60–99)
Lab: 15 mg/dL (ref 6–20)
Potassium: 4.5 mmol/L (ref 3.3–5.1)
Sodium: 138 mmol/L (ref 133–145)
Total Protein: 6.9 g/dL (ref 6.3–7.7)
eGFR BY CREAT: 96 *

## 2022-07-31 LAB — LIPID PANEL
Chol/HDL Ratio: 1.8
Cholesterol: 207 mg/dL — AB
HDL: 112 mg/dL — ABNORMAL HIGH (ref 40–60)
LDL Calculated: 82 mg/dL
Non HDL Cholesterol: 95 mg/dL
Triglycerides: 65 mg/dL

## 2022-07-31 LAB — CBC AND DIFFERENTIAL
Baso # K/uL: 0 10*3/uL (ref 0.0–0.2)
Basophil %: 0.2 %
Eos # K/uL: 0 10*3/uL (ref 0.0–0.5)
Eosinophil %: 0.4 %
Hematocrit: 40 % (ref 34–49)
Hemoglobin: 12.9 g/dL (ref 11.2–16.0)
IMM Granulocytes #: 0 10*3/uL (ref 0.0–0.0)
IMM Granulocytes: 0.2 %
Lymph # K/uL: 1.2 10*3/uL (ref 1.0–5.0)
Lymphocyte %: 23.8 %
MCH: 33 pg — ABNORMAL HIGH (ref 26–32)
MCHC: 32 g/dL (ref 32–36)
MCV: 102 fL — ABNORMAL HIGH (ref 75–100)
Mono # K/uL: 0.5 10*3/uL (ref 0.1–1.0)
Monocyte %: 9.3 %
Neut # K/uL: 3.2 10*3/uL (ref 1.5–6.5)
Nucl RBC # K/uL: 0 10*3/uL (ref 0.0–0.0)
Nucl RBC %: 0 /100 WBC (ref 0.0–0.2)
Platelets: 244 10*3/uL (ref 150–450)
RBC: 3.9 MIL/uL — ABNORMAL LOW (ref 4.0–5.5)
RDW: 13.2 % (ref 0.0–15.0)
Seg Neut %: 66.1 %
WBC: 4.8 10*3/uL (ref 3.5–11.0)

## 2022-07-31 LAB — TSH: TSH: 1.48 u[IU]/mL (ref 0.27–4.20)

## 2022-07-31 LAB — VITAMIN D: 25-OH Vit Total: 42 ng/mL (ref 30–60)

## 2022-07-31 MED ORDER — PREDNISONE 10 MG PO TABS *I*
ORAL_TABLET | ORAL | 0 refills | Status: AC
Start: 2022-07-31 — End: 2022-08-06

## 2022-07-31 NOTE — Telephone Encounter (Signed)
Pt got a bug bite over her eyebrow last night. There is a hard lump and swelling in the area. She took two doses of an antihistamine today but has developed acute onset rhinitis, and fullness in her ears, nose, head, that feels different from her allergic rhinitis.  She wonders about a more systemic allergic reaction  Continue antihistamine, will add steroid  10mg  prednisone tonight, then 20mg  for two days, 10 mg for two days, 5 mg for two days and then off  Script sent

## 2022-08-02 LAB — CARBAMAZEPINE, FREE & TOTAL
Carbamazepine, Free: 20.4 % (ref 8.0–35.0)
Carbamazepine,Free: 2.3 ug/mL (ref 1.0–3.0)
Carbamazepine,Total: 11.3 ug/mL (ref 4.0–12.0)

## 2022-08-03 ENCOUNTER — Other Ambulatory Visit: Payer: Self-pay

## 2022-08-03 ENCOUNTER — Ambulatory Visit: Payer: Medicare Other | Attending: Primary Care | Admitting: Primary Care

## 2022-08-03 ENCOUNTER — Encounter: Payer: Self-pay | Admitting: Primary Care

## 2022-08-03 VITALS — BP 110/64 | HR 80 | Ht 62.99 in | Wt 104.2 lb

## 2022-08-03 DIAGNOSIS — M810 Age-related osteoporosis without current pathological fracture: Secondary | ICD-10-CM

## 2022-08-03 DIAGNOSIS — I10 Essential (primary) hypertension: Secondary | ICD-10-CM

## 2022-08-03 DIAGNOSIS — E034 Atrophy of thyroid (acquired): Secondary | ICD-10-CM

## 2022-08-03 DIAGNOSIS — E559 Vitamin D deficiency, unspecified: Secondary | ICD-10-CM | POA: Insufficient documentation

## 2022-08-03 DIAGNOSIS — Z0000 Encounter for general adult medical examination without abnormal findings: Secondary | ICD-10-CM | POA: Insufficient documentation

## 2022-08-03 DIAGNOSIS — G40909 Epilepsy, unspecified, not intractable, without status epilepticus: Secondary | ICD-10-CM

## 2022-08-03 NOTE — Progress Notes (Signed)
Reason for Visit: Follow-up    Her stomach seems a little better  She is urinating more at night    We reviewed her labs, which are generally stable    Reaction to insect bite--stopped the prednisone after two days.    She took 10mg  , then 20mg  the following day and then she stopped  Sx have improved    We reviewed her dexa from Dr. Arma Heading.  The changes since the last scan did not reach statistical significance. Would continue current regimen (prolia, calcium, vitamin d and regular weight bearing exercise)    Recent Results (from the past 336 hour(s))   Carbamazepine, free & total    Collection Time: 07/31/22  9:05 AM   Result Value Ref Range    Carbamazepine,Free 2.3 1.0 - 3.0 ug/mL    Carbamazepine, Free 20.4 8.0 - 35.0 %    Carbamazepine,Total 11.3 4.0 - 12.0 ug/mL   Vitamin D    Collection Time: 07/31/22  9:05 AM   Result Value Ref Range    25-OH Vit Total 42 30 - 60 ng/mL   TSH    Collection Time: 07/31/22  9:05 AM   Result Value Ref Range    TSH 1.48 0.27 - 4.20 uIU/mL   Lipid Panel (Reflex to Direct  LDL if Triglycerides more than 400)    Collection Time: 07/31/22  9:05 AM   Result Value Ref Range    Cholesterol 207 (!) mg/dL    Triglycerides 65 mg/dL    HDL 130 (H) 40 - 60 mg/dL    LDL Calculated 82 mg/dL    Non HDL Cholesterol 95 mg/dL    Chol/HDL Ratio 1.8    CBC and differential    Collection Time: 07/31/22  9:05 AM   Result Value Ref Range    WBC 4.8 3.5 - 11.0 THOU/uL    RBC 3.9 (L) 4.0 - 5.5 MIL/uL    Hemoglobin 12.9 11.2 - 16.0 g/dL    Hematocrit 40 34 - 49 %    MCV 102 (H) 75 - 100 fL    MCH 33 (H) 26 - 32 pg    MCHC 32 32 - 36 g/dL    RDW 86.5 0.0 - 78.4 %    Platelets 244 150 - 450 THOU/uL    Seg Neut % 66.1 %    Lymphocyte % 23.8 %    Monocyte % 9.3 %    Eosinophil % 0.4 %    Basophil % 0.2 %    Neut # K/uL 3.2 1.5 - 6.5 THOU/uL    Lymph # K/uL 1.2 1.0 - 5.0 THOU/uL    Mono # K/uL 0.5 0.1 - 1.0 THOU/uL    Eos # K/uL 0.0 0.0 - 0.5 THOU/uL    Baso # K/uL 0.0 0.0 - 0.2 THOU/uL    Nucl RBC % 0.0 0.0  - 0.2 /100 WBC    Nucl RBC # K/uL 0.0 0.0 - 0.0 THOU/uL    IMM Granulocytes # 0.0 0.0 - 0.0 THOU/uL    IMM Granulocytes 0.2 %   Comprehensive metabolic panel    Collection Time: 07/31/22  9:05 AM   Result Value Ref Range    Sodium 138 133 - 145 mmol/L    Potassium 4.5 3.3 - 5.1 mmol/L    Chloride 100 96 - 108 mmol/L    CO2 29 (H) 20 - 28 mmol/L    Anion Gap 9 7 - 16    UN 15 6 - 20  mg/dL    Creatinine 1.61 0.96 - 0.95 mg/dL    eGFR BY CREAT 96 *    Glucose 98 60 - 99 mg/dL    Calcium 9.6 8.6 - 04.5 mg/dL    Total Protein 6.9 6.3 - 7.7 g/dL    Albumin 4.7 3.5 - 5.2 g/dL    Bilirubin,Total 0.3 0.0 - 1.2 mg/dL    AST 23 0 - 35 U/L    ALT 36 (H) 0 - 35 U/L    Alk Phos 61 35 - 105 U/L          Patient Active Problem List   Diagnosis Code    Reported Trauma Neck     Allergic rhinitis J30.9    Irritable bowel syndrome K58.9    Essential hypertension I10    Dysplastic Nevus I78.1    Rosacea L71.9    Osteoporosis M81.0    Hypothyroidism E03.9    Scoliosis M41.20    Urge Incontinence Of Urine N39.41    A-V Malformation Repair Supratentorial Complex     Seizure disorder G40.909    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    Ringing In The Ears (Tinnitus) H93.19    GERD (gastroesophageal reflux disease) K21.9    Pain in joint, ankle and foot M25.579    Weakness of foot R29.898    Hyperlipidemia E78.5     Current Outpatient Medications   Medication    levothyroxine (SYNTHROID, LEVOTHROID) 25 mcg tablet    carBAMazepine (CARBATROL) 100 mg 12 hr capsule    azelastine (ASTELIN) 0.1 % nasal spray    fluticasone (FLOVENT DISKUS) 250 MCG/ACT diskus inhaler    RABEprazole (ACIPHEX) 20 MG tablet    atorvastatin (LIPITOR) 10 mg tablet    generic DME    metroNIDAZOLE (METROCREAM) 0.75 % cream    carBAMazepine (TEGRETOL XR) 200 mg 12 hr tablet    solifenacin (VESICARE) 10 mg tablet    amLODIPine (NORVASC) 5 mg tablet    famotidine (PEPCID) 20 mg tablet    beclomethasone (QNASL) 80 MCG/ACT nasal spray    Non-System Medication     Calcium Carb-Cholecalciferol 600-200 MG-UNIT TABS    loratadine (CLARITIN) 10 mg tablet    Multiple Vitamin (MULTIVITAMIN) per tablet    cholecalciferol (VITAMIN D) 1000 UNITS capsule    predniSONE (DELTASONE) 10 mg tablet    econazole nitrate (SPECTAZOLE) 1 % cream    pseudoephedrine (SUDAFED) 30 mg tablet    [facility] 12 HOUR NASAL DECONGESTANT 120 MG 12 hr tablet    triamcinolone (KENALOG) 0.1 % ointment    Denosumab (PROLIA SC)     No current facility-administered medications for this visit.       Medication list reviewed and updated, no changes were made today    Exam: BP 110/64 (BP Location: Left arm)   Pulse 80   Ht 1.6 m (5' 2.99")   Wt 47.3 kg (104 lb 3.2 oz)   SpO2 99%   BMI 18.46 kg/m   Well-nourished alert and conversant, no acute distress. Nasal mucosa is boggy without exudate.  Neck is supple without lad, lungs are CTA bilaterally, resp easy.  Cardiac exam reveals nl S1S2 RRR.  Breast exam is without mass or axillary adenopathy Abdomen is soft, nontender, normoactive BS appreciated.  No organomegaly noted.  Extremities reveal 2+DP and PT pulses and no edema.    A/P:  Osteoporosis--dexa is encouraging as she appears to be responding to treatment; continue prolia, calcium, vitamin d and regular weight bearing exercise  as tolerated  Htn--well controlled on amlodipine  Hyperlipidemia--LDL at goal on low dose atorvastatin  Nocturia--will try reducing water intake, particularly after 8pm; continue vesicare  Allergic rhinitis--had a flare associated with the insect bite, but this has responded to a couple days of prednisone, she will continue loratadine, astelin, q nasl,   6.  HCM--due for colonoscopy, GI referral placed  Orders Placed This Encounter   Procedures    Vitamin D    TSH    CBC    Comprehensive metabolic panel    Lipid Panel (Reflex to Direct  LDL if Triglycerides more than 400)    Carbamazepine, free & total    AMB REFERRAL TO [facility]     44 min personally spent on the  date of the encounter including face to face pre and post visit work

## 2022-08-04 ENCOUNTER — Encounter: Payer: Self-pay | Admitting: Primary Care

## 2022-08-06 ENCOUNTER — Encounter: Payer: Self-pay | Admitting: Primary Care

## 2022-08-06 ENCOUNTER — Encounter: Payer: Self-pay | Admitting: Internal Medicine

## 2022-08-07 ENCOUNTER — Ambulatory Visit (INDEPENDENT_AMBULATORY_CARE_PROVIDER_SITE_OTHER): Payer: Medicare Other

## 2022-08-07 ENCOUNTER — Telehealth: Payer: Self-pay | Admitting: Internal Medicine

## 2022-08-07 DIAGNOSIS — R55 Syncope and collapse: Secondary | ICD-10-CM | POA: Diagnosis not present

## 2022-08-07 LAB — CUP PACEART REMOTE DEVICE CHECK
Date Time Interrogation Session: 20230917230331
Implantable Pulse Generator Implant Date: 20220531

## 2022-08-07 NOTE — Telephone Encounter (Signed)
ILR summary report received. Battery status OK. Normal device function. No new symptom, tachy, brady, episodes. Monthly summary reports and ROV/PRN Brief AT/AF per compass, no EGM's for review, ASA only 1 new pause event 9/17 @ 16:33, 4sec in duration - route to triage  Please see patients mychart message. Patient was hiking and has positive loc during this pause. Advised I will forward to Dr. [name] for review.

## 2022-08-07 NOTE — Telephone Encounter (Signed)
Pt is calling in regards to the MyChart message left 09/17 in regards to syncope episode she had. Requesting call back to discuss this and the results from her heart monitor.

## 2022-08-08 ENCOUNTER — Telehealth: Payer: Self-pay

## 2022-08-08 NOTE — Telephone Encounter (Signed)
Pt is calling back wanting to know why she has not gotten a call back. I let her speak with Sonia Baller, rn.

## 2022-08-08 NOTE — Telephone Encounter (Signed)
Received report from Myrtie Hawk, RN that patient had been advised to stop metoprolol due to ILR reoprts 4 second pause on 9/17 during patient's hike where she lost consciousness.   Patient requested virtual visit with Dr. Tamala Julian to discuss stopping metoprolol as she is very concerned about not continuing this medication. Advised patient that Dr. Tamala Julian is deferring to Dr. [name] and agrees with decision to stop metoprolol.  Patient understands that stopping metoprolol is recommended due to the pause, but is very concerned about episodes of SVT and essential tremor if she does not continue to take it.  She has several questions regarding pacemaker placement and requests a virtual or telephone visit with Dr. [name] to discuss her concerns and answer her questions. She states she is 900 miles away and is unable to come into office at this time.  One of her main concerns that she expressed is that she is a very active person and whether this means she will have to become less active.  Patient would like to know if she could try taking metoprolol '25mg'$  once daily (instead of twice) to see if that helps prevent pauses and keeps SVT at [place].  Will forward to Dr. Tanna Furry nurse to review and follow-up.  Patient expressed appreciation for call.

## 2022-08-08 NOTE — Telephone Encounter (Signed)
Spoke to Dr. [name] in clinic today about previous note attached. Verbal order obtained to STOP Toprol-XL '25mg'$  BID and if patient has any further episodes, she will need pacemaker. Updated Sonia Baller, RN and per Sonia Baller she will relay to patient.

## 2022-08-08 NOTE — Telephone Encounter (Signed)
Spoke with Pt and advised that Dr. [name] wants her to stop her Toprol XL.  Pt states she has been on medication for 20 years and will not stop it without discussing with Dr. Tamala Julian.  Dr. Thompson Caul nurse will reach out to Pt.  Did advise Pt if she did not stop the Toprol she may have another syncopal event.  She states she understands.  Will continue to monitor.

## 2022-08-09 DIAGNOSIS — Z681 Body mass index (BMI) 19 or less, adult: Secondary | ICD-10-CM | POA: Diagnosis not present

## 2022-08-09 DIAGNOSIS — R300 Dysuria: Secondary | ICD-10-CM | POA: Diagnosis not present

## 2022-08-09 DIAGNOSIS — Z23 Encounter for immunization: Secondary | ICD-10-CM | POA: Diagnosis not present

## 2022-08-09 DIAGNOSIS — N3001 Acute cystitis with hematuria: Secondary | ICD-10-CM | POA: Diagnosis not present

## 2022-08-09 NOTE — Telephone Encounter (Signed)
Discussed further with Dr. [name].  Per Dr. Fransisco Hertz recommendation is for Pt to stop metoprolol completely.  If Pt would like to discuss further ok to schedule in office appointment for next available time.  Alternatively Pt may seek cardiology care where she is currently residing.  Per review of Pt chart-she has an appt with Dr. Charisse Klinefelter 08/10/2022 in her current location ([place] area).  Await further needs.

## 2022-08-10 ENCOUNTER — Encounter: Payer: Self-pay | Admitting: Internal Medicine

## 2022-08-10 DIAGNOSIS — I480 Paroxysmal atrial fibrillation: Secondary | ICD-10-CM | POA: Diagnosis not present

## 2022-08-10 DIAGNOSIS — I471 Supraventricular tachycardia: Secondary | ICD-10-CM | POA: Diagnosis not present

## 2022-08-10 DIAGNOSIS — I10 Essential (primary) hypertension: Secondary | ICD-10-CM | POA: Diagnosis not present

## 2022-08-10 DIAGNOSIS — Z9889 Other specified postprocedural states: Secondary | ICD-10-CM | POA: Diagnosis not present

## 2022-08-10 DIAGNOSIS — E785 Hyperlipidemia, unspecified: Secondary | ICD-10-CM | POA: Diagnosis not present

## 2022-08-10 DIAGNOSIS — Z8679 Personal history of other diseases of the circulatory system: Secondary | ICD-10-CM | POA: Diagnosis not present

## 2022-08-11 NOTE — Telephone Encounter (Signed)
Spoke w/pt about an appt with [facility] when she returns. Scheduled for Oct 16th. She would like to know about his procedure schedule because she needs one.

## 2022-08-15 NOTE — Telephone Encounter (Signed)
Called patient regarding her concerns with the 09/04/22 appointment time with Dr. [name].   Pt stated she is back in town 10/1, and was advised by an out of town cardiologist to have a pacemaker put in.  She had a syncopal event according to her, and was advised to do this but did not feel comfortable.    Dr. [name] is out of the office this week, and Pt made aware of this.  However, Pt was called and told that we will place her on the Wait List for Dr. [name] for a sooner appointment / than 09/04/22.  Pt also sent a MyChart message.

## 2022-08-21 ENCOUNTER — Ambulatory Visit: Payer: Medicare Other | Admitting: Internal Medicine

## 2022-08-21 NOTE — Progress Notes (Signed)
Carelink Summary Report / Loop Recorder 

## 2022-08-31 ENCOUNTER — Ambulatory Visit (HOSPITAL_COMMUNITY): Payer: Medicare Other | Attending: Interventional Cardiology

## 2022-08-31 DIAGNOSIS — I480 Paroxysmal atrial fibrillation: Secondary | ICD-10-CM | POA: Diagnosis not present

## 2022-08-31 DIAGNOSIS — Z9889 Other specified postprocedural states: Secondary | ICD-10-CM | POA: Diagnosis not present

## 2022-08-31 LAB — ECHOCARDIOGRAM COMPLETE
Area-P 1/2: 1.63 cm2
MV M vel: 5.51 m/s
MV Peak grad: 121.4 mmHg
MV VTI: 1.48 cm2
S' Lateral: 2.9 cm

## 2022-09-01 ENCOUNTER — Encounter: Payer: Self-pay | Admitting: Internal Medicine

## 2022-09-04 ENCOUNTER — Ambulatory Visit: Payer: Medicare Other | Attending: Primary Care

## 2022-09-04 ENCOUNTER — Other Ambulatory Visit: Payer: Self-pay

## 2022-09-04 ENCOUNTER — Encounter: Payer: Medicare Other | Admitting: Internal Medicine

## 2022-09-04 DIAGNOSIS — Z23 Encounter for immunization: Secondary | ICD-10-CM | POA: Insufficient documentation

## 2022-09-04 NOTE — Progress Notes (Signed)
Patient given 0.48ml of High Dose Influenza Vaccine IM in her left deltoid per patient's request. Order approved by Dr. Deirdre Priest to administer 1/2 dose of High Dose Influenza Vaccine and for patient to receive another dose of 0.63ml of High Dose Influenza Vaccine in the next 7-14 days.

## 2022-09-05 NOTE — Telephone Encounter (Signed)
Patient is following up, requesting to review heart monitor results.

## 2022-09-07 ENCOUNTER — Encounter: Payer: Medicare Other | Admitting: Internal Medicine

## 2022-09-08 ENCOUNTER — Encounter: Payer: Self-pay | Admitting: Internal Medicine

## 2022-09-08 ENCOUNTER — Encounter: Payer: Self-pay | Admitting: Interventional Cardiology

## 2022-09-08 ENCOUNTER — Ambulatory Visit: Payer: Medicare Other | Attending: Internal Medicine | Admitting: Internal Medicine

## 2022-09-08 VITALS — BP 122/82 | HR 59 | Ht 67.0 in | Wt 168.0 lb

## 2022-09-08 DIAGNOSIS — I495 Sick sinus syndrome: Secondary | ICD-10-CM | POA: Diagnosis not present

## 2022-09-08 NOTE — H&P (View-Only) (Signed)
HPI Ms. Gardella returns today for followup. She is a pleasant 66 yo woman with a h/o unexplained syncope as well as prior mitral valve repair/MAZE and autonomic dysfunction. I implanted an ILR back in May. She has not had any pauses or other events. She feels well. Today she did 2 yoga classes.  No Known Allergies   Current Outpatient Medications  Medication Sig Dispense Refill   amoxicillin (AMOXIL) 500 MG capsule TAKE 4 CAPSULES BY MOUTH 1 HOUR BEFORE DENTAL APPOINTMENT 4 capsule 3   aspirin 81 MG tablet Take 1 tablet (81 mg total) by mouth daily.     atorvastatin (LIPITOR) 20 MG tablet TAKE 1 TABLET BY MOUTH DAILY 90 tablet 1   Calcium Carbonate-Vitamin D (CALCIUM + D PO) Take 1 capsule by mouth daily.      metoprolol succinate (TOPROL-XL) 25 MG 24 hr tablet TAKE 1 TABLET(25 MG) BY MOUTH IN THE MORNING AND AT BEDTIME WITH OR IMMEDIATELY FOLLOWING A MEAL (Patient taking differently: 25 mg daily.  WITH OR IMMEDIATELY FOLLOWING A MEAL) 180 tablet 1   Misc Natural Products (GLUCOSAMINE CHOND COMPLEX/MSM PO) Take 1 tablet by mouth daily.     Omega-3 Fatty Acids (FISH OIL PO) Take 2 capsules by mouth daily.     polyethylene glycol (MIRALAX / GLYCOLAX) packet Take 17 g by mouth daily.     TURMERIC PO Take 1 tablet by mouth daily.      zolpidem (AMBIEN) 5 MG tablet Take 0.5 tablets (2.5 mg total) by mouth at bedtime as needed for sleep. 45 tablet 0   No current facility-administered medications for this visit.     Past Medical History:  Diagnosis Date   Arthritis    Benign essential tremor    CHF (congestive heart failure) (HCC)    Heart murmur    High cholesterol    Hypertension    Insomnia    Mitral regurgitation    Mitral valve prolapse    Osteopenia    Paroxysmal atrial fibrillation (HCC) 03/11/2013   Paroxysmal supraventricular tachycardia    PONV (postoperative nausea and vomiting)    Dizziness and Nausea   S/P Maze operation for atrial fibrillation 03/18/2013    Complete bilateral atrial lesion set using cryothermy and biopolar radiofrequency ablation with oversewing of LA appendage via right mini thoracotomy   S/P mitral valve repair 03/18/2013   Complex valvuloplasty including triangular resection of posterior leaflet, artificial Gore-tex neocord placement x4 and 25m Sorin Memo 3D ring annuloplasty via right mini thoracotomy   Severe mitral regurgitation 03/03/2013   Acute on chronic with acute heart failure    Shortness of breath    Wears glasses     ROS:   All systems reviewed and negative except as noted in the HPI.   Past Surgical History:  Procedure Laterality Date   ABDOMINAL HYSTERECTOMY     hernia repair   BIOPSY  08/26/2019   Procedure: BIOPSY;  Surgeon: SWilford Corner MD;  Location: [facility];  Service: Endoscopy;;   CARDIAC CATHETERIZATION     COLONOSCOPY     COLONOSCOPY WITH PROPOFOL N/A 08/26/2019   Procedure: COLONOSCOPY WITH PROPOFOL;  Surgeon: SWilford Corner MD;  Location: [facility];  Service: Endoscopy;  Laterality: N/A;   INTRAOPERATIVE TRANSESOPHAGEAL ECHOCARDIOGRAM N/A 03/18/2013   Procedure: INTRAOPERATIVE TRANSESOPHAGEAL ECHOCARDIOGRAM;  Surgeon: CRexene Alberts MD;  [place]  Service: Open Heart Surgery;  Laterality: N/A;   LEFT AND RIGHT HEART CATHETERIZATION WITH CORONARY ANGIOGRAM N/A 03/06/2013  Procedure: LEFT AND RIGHT HEART CATHETERIZATION WITH CORONARY ANGIOGRAM;  Surgeon: Sinclair Grooms, MD;  Location: [facility] CATH LAB;  Service: Cardiovascular;  Laterality: N/A;   MAZE N/A 03/18/2013   Procedure: MAZE;  Surgeon: Rexene Alberts, MD;  Location: [place];  Service: Open Heart Surgery;  Laterality: N/A;   MITRAL VALVE REPAIR Right 03/18/2013   Procedure: MINIMALLY INVASIVE MITRAL VALVE REPAIR (MVR);  Surgeon: Rexene Alberts, MD;  Location: [place];  Service: Open Heart Surgery;  Laterality: Right;   VARICOSE VEIN SURGERY     WISDOM TOOTH EXTRACTION       Family History  Problem Relation Age of  Onset   Pancreatic cancer Mother    Stomach cancer Father    Hypertension Father    Thyroid cancer Sister    Colon cancer Paternal Grandmother    Stomach cancer Paternal Grandfather    Seizures Child    Down syndrome Child      Social History   Socioeconomic History   Marital status: Married    Spouse name: Fritz Pickerel   Number of children: 3   Years of education: BS   Highest education level: Not on file  Occupational History    Employer: [employer]  Tobacco Use   Smoking status: Never   Smokeless tobacco: Never  Vaping Use   Vaping Use: Never used  Substance and Sexual Activity   Alcohol use: Yes    Alcohol/week: 5.0 - 6.0 standard drinks of alcohol    Types: 5 - 6 Glasses of wine per week    Comment: 1 drink/day   Drug use: No   Sexual activity: Yes    Birth control/protection: Surgical  Other Topics Concern   Not on file  Social History Narrative   Patient lives at home with her husband  Fritz Pickerel)  - second husband. Patient has college education B.S. 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   Caffeine- one cup daily.   Social Determinants of Health   Financial Resource Strain: Low Risk  (10/28/2021)   Overall Financial Resource Strain (CARDIA)    Difficulty of Paying Living Expenses: Not hard at all  Food Insecurity: No Food Insecurity (10/28/2021)   Hunger Vital Sign    Worried About Running Out of Food in the Last Year: Never true    Ran Out of Food in the Last Year: Never true  Transportation Needs: No Transportation Needs (10/28/2021)   PRAPARE - Hydrologist (Medical): No    Lack of Transportation (Non-Medical): No  Physical Activity: Sufficiently Active (10/28/2021)   Exercise Vital Sign    Days of Exercise per Week: 7 days    Minutes of Exercise per Session: 60 min  Stress: No Stress Concern Present (10/28/2021)   [place]     Feeling of Stress : Not at all  Social Connections: [place] (10/28/2021)   Social Connection and Isolation Panel [NHANES]    Frequency of Communication with Friends and Family: More than three times a week    Frequency of Social Gatherings with Friends and Family: Once a week    Attends Religious Services: 1 to 4 times per year    Active Member of [employer] or Organizations: Yes    Attends Archivist Meetings: 1 to 4 times per year    Marital Status: Married  Human resources officer Violence: Not At Risk (10/28/2021)  Humiliation, Afraid, Rape, and Kick questionnaire    Fear of Current or Ex-Partner: No    Emotionally Abused: No    Physically Abused: No    Sexually Abused: No     BP 122/82   Pulse (!) 59   Ht '5\' 7"'$  (1.702 m)   Wt 168 lb (76.2 kg)   SpO2 96%   BMI 26.31 kg/m   Physical Exam:  Well appearing NAD HEENT: Unremarkable Neck:  No JVD, no thyromegally Lymphatics:  No adenopathy Back:  No CVA tenderness Lungs:  Clear HEART:  Regular rate rhythm, no murmurs, no rubs, no clicks Abd:  soft, positive bowel sounds, no organomegally, no rebound, no guarding Ext:  2 plus pulses, no edema, no cyanosis, no clubbing Skin:  No rashes no nodules Neuro:  CN II through XII intact, motor grossly intact  EKG  DEVICE  Normal device function.  See PaceArt for details.   Assess/Plan:  1. Unexplained syncope - she has undergone insertion of a Medtronic ILR. She had syncope with a 4 second daytime pause. I have recommended PPM Insertion.  2. PAF - we will be able to monitor this as she is not on systemic anti-coagulation. She has not had any known arrhythmias since her MAZE.  3. SVT - she has had 2 known episodes. We will follow with the ILR in place. After her PPM is inserted we will remove the ILR. I would anticipate uptitration of her meds after her PM insertion. 4. Sinus node dysfunction - this is a new finding but not surprising since she was treated with a MAZE at  the time of her mitral valve surgery.   Royston Sinner Milicent Acheampong,MD

## 2022-09-08 NOTE — Progress Notes (Signed)
HPI Ariel Simpson returns today for followup. She is a pleasant 66 yo woman with a h/o unexplained syncope as well as prior mitral valve repair/MAZE and autonomic dysfunction. I implanted an ILR back in May. She has not had any pauses or other events. She feels well. Today she did 2 yoga classes.  No Known Allergies   Current Outpatient Medications  Medication Sig Dispense Refill   amoxicillin (AMOXIL) 500 MG capsule TAKE 4 CAPSULES BY MOUTH 1 HOUR BEFORE DENTAL APPOINTMENT 4 capsule 3   aspirin 81 MG tablet Take 1 tablet (81 mg total) by mouth daily.     atorvastatin (LIPITOR) 20 MG tablet TAKE 1 TABLET BY MOUTH DAILY 90 tablet 1   Calcium Carbonate-Vitamin D (CALCIUM + D PO) Take 1 capsule by mouth daily.      metoprolol succinate (TOPROL-XL) 25 MG 24 hr tablet TAKE 1 TABLET(25 MG) BY MOUTH IN THE MORNING AND AT BEDTIME WITH OR IMMEDIATELY FOLLOWING A MEAL (Patient taking differently: 25 mg daily.  WITH OR IMMEDIATELY FOLLOWING A MEAL) 180 tablet 1   Misc Natural Products (GLUCOSAMINE CHOND COMPLEX/MSM PO) Take 1 tablet by mouth daily.     Omega-3 Fatty Acids (FISH OIL PO) Take 2 capsules by mouth daily.     polyethylene glycol (MIRALAX / GLYCOLAX) packet Take 17 g by mouth daily.     TURMERIC PO Take 1 tablet by mouth daily.      zolpidem (AMBIEN) 5 MG tablet Take 0.5 tablets (2.5 mg total) by mouth at bedtime as needed for sleep. 45 tablet 0   No current facility-administered medications for this visit.     Past Medical History:  Diagnosis Date   Arthritis    Benign essential tremor    CHF (congestive heart failure) (HCC)    Heart murmur    High cholesterol    Hypertension    Insomnia    Mitral regurgitation    Mitral valve prolapse    Osteopenia    Paroxysmal atrial fibrillation (HCC) 03/11/2013   Paroxysmal supraventricular tachycardia    PONV (postoperative nausea and vomiting)    Dizziness and Nausea   S/P Maze operation for atrial fibrillation 03/18/2013    Complete bilateral atrial lesion set using cryothermy and biopolar radiofrequency ablation with oversewing of LA appendage via right mini thoracotomy   S/P mitral valve repair 03/18/2013   Complex valvuloplasty including triangular resection of posterior leaflet, artificial Gore-tex neocord placement x4 and 39m Sorin Memo 3D ring annuloplasty via right mini thoracotomy   Severe mitral regurgitation 03/03/2013   Acute on chronic with acute heart failure    Shortness of breath    Wears glasses     ROS:   All systems reviewed and negative except as noted in the HPI.   Past Surgical History:  Procedure Laterality Date   ABDOMINAL HYSTERECTOMY     hernia repair   BIOPSY  08/26/2019   Procedure: BIOPSY;  Surgeon: SWilford Corner MD;  Location: [facility];  Service: Endoscopy;;   CARDIAC CATHETERIZATION     COLONOSCOPY     COLONOSCOPY WITH PROPOFOL N/A 08/26/2019   Procedure: COLONOSCOPY WITH PROPOFOL;  Surgeon: SWilford Corner MD;  Location: [facility];  Service: Endoscopy;  Laterality: N/A;   INTRAOPERATIVE TRANSESOPHAGEAL ECHOCARDIOGRAM N/A 03/18/2013   Procedure: INTRAOPERATIVE TRANSESOPHAGEAL ECHOCARDIOGRAM;  Surgeon: CRexene Alberts MD;  [place]  Service: Open Heart Surgery;  Laterality: N/A;   LEFT AND RIGHT HEART CATHETERIZATION WITH CORONARY ANGIOGRAM N/A 03/06/2013  Procedure: LEFT AND RIGHT HEART CATHETERIZATION WITH CORONARY ANGIOGRAM;  Surgeon: Sinclair Grooms, MD;  [facility]: [facility] CATH LAB;  Service: Cardiovascular;  Laterality: N/A;   MAZE N/A 03/18/2013   Procedure: MAZE;  Surgeon: Rexene Alberts, MD;  Location: [place];  Service: Open Heart Surgery;  Laterality: N/A;   MITRAL VALVE REPAIR Right 03/18/2013   Procedure: MINIMALLY INVASIVE MITRAL VALVE REPAIR (MVR);  Surgeon: Rexene Alberts, MD;  Location: [place];  Service: Open Heart Surgery;  Laterality: Right;   VARICOSE VEIN SURGERY     WISDOM TOOTH EXTRACTION       Family History  Problem Relation Age of  Onset   Pancreatic cancer Mother    Stomach cancer Father    Hypertension Father    Thyroid cancer Sister    Colon cancer Paternal Grandmother    Stomach cancer Paternal Grandfather    Seizures Child    Down syndrome Child      Social History   Socioeconomic History   Marital status: Married    Spouse name: Fritz Pickerel   Number of children: 3   Years of education: BS   Highest education level: Not on file  Occupational History    Employer: [employer]  Tobacco Use   Smoking status: Never   Smokeless tobacco: Never  Vaping Use   Vaping Use: Never used  Substance and Sexual Activity   Alcohol use: Yes    Alcohol/week: 5.0 - 6.0 standard drinks of alcohol    Types: 5 - 6 Glasses of wine per week    Comment: 1 drink/day   Drug use: No   Sexual activity: Yes    Birth control/protection: Surgical  Other Topics Concern   Not on file  Social History Narrative   Patient lives at home with her husband  Fritz Pickerel)  - second husband. Patient has college education B.S. 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   Caffeine- one cup daily.   Social Determinants of Health   Financial Resource Strain: Low Risk  (10/28/2021)   Overall Financial Resource Strain (CARDIA)    Difficulty of Paying Living Expenses: Not hard at all  Food Insecurity: No Food Insecurity (10/28/2021)   Hunger Vital Sign    Worried About Running Out of Food in the Last Year: Never true    Ran Out of Food in the Last Year: Never true  Transportation Needs: No Transportation Needs (10/28/2021)   PRAPARE - Hydrologist (Medical): No    Lack of Transportation (Non-Medical): No  Physical Activity: Sufficiently Active (10/28/2021)   Exercise Vital Sign    Days of Exercise per Week: 7 days    Minutes of Exercise per Session: 60 min  Stress: No Stress Concern Present (10/28/2021)   [place]     Feeling of Stress : Not at all  Social Connections: [place] (10/28/2021)   Social Connection and Isolation Panel [NHANES]    Frequency of Communication with Friends and Family: More than three times a week    Frequency of Social Gatherings with Friends and Family: Once a week    Attends Religious Services: 1 to 4 times per year    Active Member of [employer] or Organizations: Yes    Attends Archivist Meetings: 1 to 4 times per year    Marital Status: Married  Human resources officer Violence: Not At Risk (10/28/2021)  Humiliation, Afraid, Rape, and Kick questionnaire    Fear of Current or Ex-Partner: No    Emotionally Abused: No    Physically Abused: No    Sexually Abused: No     BP 122/82   Pulse (!) 59   Ht '5\' 7"'$  (1.702 m)   Wt 168 lb (76.2 kg)   SpO2 96%   BMI 26.31 kg/m   Physical Exam:  Well appearing NAD HEENT: Unremarkable Neck:  No JVD, no thyromegally Lymphatics:  No adenopathy Back:  No CVA tenderness Lungs:  Clear HEART:  Regular rate rhythm, no murmurs, no rubs, no clicks Abd:  soft, positive bowel sounds, no organomegally, no rebound, no guarding Ext:  2 plus pulses, no edema, no cyanosis, no clubbing Skin:  No rashes no nodules Neuro:  CN II through XII intact, motor grossly intact  EKG  DEVICE  Normal device function.  See PaceArt for details.   Assess/Plan:  1. Unexplained syncope - she has undergone insertion of a Medtronic ILR. She had syncope with a 4 second daytime pause. I have recommended PPM Insertion.  2. PAF - we will be able to monitor this as she is not on systemic anti-coagulation. She has not had any known arrhythmias since her MAZE.  3. SVT - she has had 2 known episodes. We will follow with the ILR in place. After her PPM is inserted we will remove the ILR. I would anticipate uptitration of her meds after her PM insertion. 4. Sinus node dysfunction - this is a new finding but not surprising since she was treated with a MAZE at  the time of her mitral valve surgery.   Royston Sinner Hephzibah Strehle,MD

## 2022-09-08 NOTE — Patient Instructions (Signed)
Medication Instructions:  Your physician recommends that you continue on your current medications as directed. Please refer to the Current Medication list given to you today.  *If you need a refill on your cardiac medications before your next appointment, please call your pharmacy*   Lab Work: None ordered   Testing/Procedures: Your physician has recommended that you have your loop recorder explanted and a pacemaker inserted. A pacemaker is a small device that is placed under the skin of your chest or abdomen to help control abnormal heart rhythms. This device uses electrical pulses to prompt the heart to beat at a normal rate. Pacemakers are used to treat heart rhythms that are too slow. Wire (leads) are attached to the pacemaker that goes into the chambers of you heart. This is done in the hospital and usually requires and overnight stay.  Please call the office when you are ready to schedule this  The following dates are available: 11/1, 11/3, 11/6    Follow-Up: At [facility], you and your health needs are our priority.  As part of our continuing mission to provide you with exceptional heart care, we have created designated Provider Care Teams.  These Care Teams include your primary Cardiologist (physician) and Advanced Practice Providers (APPs -  Physician Assistants and Nurse Practitioners) who all work together to provide you with the care you need, when you need it.   Your next appointment:   To be  determined  The format for your next appointment:   In Person  Provider:   Cristopher Peru, MD    Thank you for choosing [facility]!!   [phone]  Other Instructions  Pacemaker Implantation, Adult Pacemaker implantation is a procedure to place a pacemaker inside the chest. A pacemaker is a small computer that sends electrical signals to the heart and helps the heart beat normally. A pacemaker also stores information about heart rhythms. You may need pacemaker  implantation if you have: A slow heartbeat (bradycardia). Loss of consciousness that happens repeatedly (syncope) or repeated episodes of dizziness or light-headedness because of an irregular heart rate. Shortness of breath (dyspnea) due to heart problems. The pacemaker usually attaches to your heart through a wire called a lead. One or two leads may be needed. There are different types of pacemakers: Transvenous pacemaker. This type is placed under the skin or muscle of your upper chest area. The lead goes through a vein in the chest area to reach the inside of the heart. Epicardial pacemaker. This type is placed under the skin or muscle of your chest or abdomen. The lead goes through your chest to the outside of the heart. Tell a health care provider about: Any allergies you have. All medicines you are taking, including vitamins, herbs, eye drops, creams, and over-the-counter medicines. Any problems you or family members have had with anesthetic medicines. Any blood or bone disorders you have. Any surgeries you have had. Any medical conditions you have. Whether you are pregnant or may be pregnant. What are the risks? Generally, this is a safe procedure. However, problems may occur, including: Infection. Bleeding. Failure of the pacemaker or the lead. Collapse of a lung or bleeding into a lung. Blood clot inside a blood vessel with a lead. Damage to the heart. Infection inside the heart (endocarditis). Allergic reactions to medicines. What happens before the procedure? Staying hydrated Follow instructions from your health care provider about hydration, which may include: Up to 2 hours before the procedure - you may continue to  drink clear liquids, such as water, clear fruit juice, black coffee, and plain tea.  Eating and drinking restrictions Follow instructions from your health care provider about eating and drinking, which may include: 8 hours before the procedure - stop eating  heavy meals or foods, such as meat, fried foods, or fatty foods. 6 hours before the procedure - stop eating light meals or foods, such as toast or cereal. 6 hours before the procedure - stop drinking milk or drinks that contain milk. 2 hours before the procedure - stop drinking clear liquids. Medicines Ask your health care provider about: Changing or stopping your regular medicines. This is especially important if you are taking diabetes medicines or blood thinners. Taking medicines such as aspirin and ibuprofen. These medicines can thin your blood. Do not take these medicines unless your health care provider tells you to take them. Taking over-the-counter medicines, vitamins, herbs, and supplements. Tests You may have: A heart evaluation. This may include: An electrocardiogram (ECG). This involves placing patches on your skin to check your heart rhythm. A chest X-ray. An echocardiogram. This is a test that uses sound waves (ultrasound) to produce an image of the heart. A cardiac rhythm monitor. This is used to record your heart rhythm and any events for a longer period of time. Blood tests. Genetic testing. General instructions Do not use any products that contain nicotine or tobacco for at least 4 weeks before the procedure. These products include cigarettes, e-cigarettes, and chewing tobacco. If you need help quitting, ask your health care provider. Ask your health care provider: How your surgery site will be marked. What steps will be taken to help prevent infection. These steps may include: Removing hair at the surgery site. Washing skin with a germ-killing soap. Receiving antibiotic medicine. Plan to have someone take you home from the hospital or clinic. If you will be going home right after the procedure, plan to have someone with you for 24 hours. What happens during the procedure? An IV will be inserted into one of your veins. You will be given one or more of the following: A  medicine to help you relax (sedative). A medicine to numb the area (local anesthetic). A medicine to make you fall asleep (general anesthetic). The next steps vary depending on the type of pacemaker you will be getting. If you are getting a transvenous pacemaker: An incision will be made in your upper chest. A pocket will be made for the pacemaker. It may be placed under the skin or between layers of muscle. The lead will be inserted into a blood vessel that goes to the heart. While X-rays are taken by an imaging machine (fluoroscopy), the lead will be advanced through the vein to the inside of your heart. The other end of the lead will be tunneled under the skin and attached to the pacemaker. If you are getting an epicardial pacemaker: An incision will be made near your ribs or breastbone (sternum) for the lead. The lead will be attached to the outside of your heart. Another incision will be made in your chest or upper abdomen to create a pocket for the pacemaker. The free end of the lead will be tunneled under the skin and attached to the pacemaker. The transvenous or epicardial pacemaker will be tested. Imaging studies may be done to check the lead position. The incisions will be closed with stitches (sutures), adhesive strips, or skin glue. Bandages (dressings) will be placed over the incisions. The procedure may vary  among health care providers and hospitals. What happens after the procedure? Your blood pressure, heart rate, breathing rate, and blood oxygen level will be monitored until you leave the hospital or clinic. You may be given antibiotics. You will be given pain medicine. An ECG and chest X-rays will be done. You may need to wear a continuous type of ECG (Holter monitor) to check your heart rhythm. Your health care provider will program the pacemaker. If you were given a sedative during the procedure, it can affect you for several hours. Do not drive or operate machinery  until your health care provider says that it is safe. You will be given a pacemaker identification card. This card lists the implant date, device model, and manufacturer of your pacemaker. Summary A pacemaker is a small computer that sends electrical signals to the heart and helps the heart beat normally. There are different types of pacemakers. A pacemaker may be placed under the skin or muscle of your chest or abdomen. Follow instructions from your health care provider about eating and drinking and about taking medicines before the procedure. This information is not intended to replace advice given to you by your health care provider. Make sure you discuss any questions you have with your health care provider. Document Revised: 07/19/2021 Document Reviewed: 10/08/2019 Elsevier Patient Education  [facility] Discharge Instructions for  Pacemaker/Defibrillator Patients  Activity No heavy lifting or vigorous activity with your left/right arm for 6 to 8 weeks.  Do not raise your left/right arm above your head for one week.  Gradually raise your affected arm as drawn below.           __  NO DRIVING for     ; you may begin driving on     .  WOUND CARE Keep the wound area clean and dry.  Do not get this area wet for one week. No showers for one week; you may shower on     . The tape/steri-strips on your wound will fall off; do not pull them off.  No bandage is needed on the site.  DO  NOT apply any creams, oils, or ointments to the wound area. If you notice any drainage or discharge from the wound, any swelling or bruising at the site, or you develop a fever > 101? F after you are discharged home, call the office at once.  Special Instructions You are still able to use cellular telephones; use the ear opposite the side where you have your pacemaker/defibrillator.  Avoid carrying your cellular phone near your device. When traveling through airports, show  security personnel your identification card to avoid being screened in the metal detectors.  Ask the security personnel to use the hand wand. Avoid arc welding equipment, MRI testing (magnetic resonance imaging), TENS units (transcutaneous nerve stimulators).  Call the office for questions about other devices. Avoid electrical appliances that are in poor condition or are not properly grounded. Microwave ovens are safe to be near or to operate.  Additional information for defibrillator patients should your device go off: If your device goes off ONCE and you feel fine afterward, notify the device clinic nurses. If your device goes off ONCE and you do not feel well afterward, call 911. If your device goes off TWICE, call 911. If your device goes off THREE times in one day, call 911.  DO NOT DRIVE YOURSELF OR A FAMILY MEMBER WITH A DEFIBRILLATOR TO THE HOSPITAL--CALL  911.   Important Information About Sugar

## 2022-09-11 ENCOUNTER — Other Ambulatory Visit: Payer: Self-pay

## 2022-09-11 ENCOUNTER — Ambulatory Visit: Payer: Medicare Other | Attending: Primary Care

## 2022-09-11 ENCOUNTER — Ambulatory Visit (INDEPENDENT_AMBULATORY_CARE_PROVIDER_SITE_OTHER): Payer: Medicare Other

## 2022-09-11 ENCOUNTER — Encounter: Payer: Self-pay | Admitting: Internal Medicine

## 2022-09-11 DIAGNOSIS — I495 Sick sinus syndrome: Secondary | ICD-10-CM

## 2022-09-11 DIAGNOSIS — Z23 Encounter for immunization: Secondary | ICD-10-CM | POA: Insufficient documentation

## 2022-09-11 NOTE — Patient Instructions (Addendum)
Blood work was ordered.   The lab is on the first floor.    Medications changes include :   None     Return in about 1 year (around 09/13/2023) for Physical Exam.      Health Maintenance, Female Adopting a healthy lifestyle and getting preventive care are important in promoting health and wellness. Ask your health care provider about: The right schedule for you to have regular tests and exams. Things you can do on your own to prevent diseases and keep yourself healthy. What should I know about diet, weight, and exercise? Eat a healthy diet  Eat a diet that includes plenty of vegetables, fruits, low-fat dairy products, and lean protein. Do not eat a lot of foods that are high in solid fats, added sugars, or sodium. Maintain a healthy weight Body mass index (BMI) is used to identify weight problems. It estimates body fat based on height and weight. Your health care provider can help determine your BMI and help you achieve or maintain a healthy weight. Get regular exercise Get regular exercise. This is one of the most important things you can do for your health. Most adults should: Exercise for at least 150 minutes each week. The exercise should increase your heart rate and make you sweat (moderate-intensity exercise). Do strengthening exercises at least twice a week. This is in addition to the moderate-intensity exercise. Spend less time sitting. Even light physical activity can be beneficial. Watch cholesterol and blood lipids Have your blood tested for lipids and cholesterol at 67 years of age, then have this test every 5 years. Have your cholesterol levels checked more often if: Your lipid or cholesterol levels are high. You are older than 68 years of age. You are at high risk for heart disease. What should I know about cancer screening? Depending on your health history and family history, you may need to have cancer screening at various ages. This may include  screening for: Breast cancer. Cervical cancer. Colorectal cancer. Skin cancer. Lung cancer. What should I know about heart disease, diabetes, and high blood pressure? Blood pressure and heart disease High blood pressure causes heart disease and increases the risk of stroke. This is more likely to develop in people who have high blood pressure readings or are overweight. Have your blood pressure checked: Every 3-5 years if you are 60-19 years of age. Every year if you are 63 years old or older. Diabetes Have regular diabetes screenings. This checks your fasting blood sugar level. Have the screening done: Once every three years after age 50 if you are at a normal weight and have a low risk for diabetes. More often and at a younger age if you are overweight or have a high risk for diabetes. What should I know about preventing infection? Hepatitis B If you have a higher risk for hepatitis B, you should be screened for this virus. Talk with your health care provider to find out if you are at risk for hepatitis B infection. Hepatitis C Testing is recommended for: Everyone born from [birth year] through 1967. Anyone with known risk factors for hepatitis C. Sexually transmitted infections (STIs) Get screened for STIs, including gonorrhea and chlamydia, if: You are sexually active and are younger than 68 years of age. You are older than 68 years of age and your health care provider tells you that you are at risk for this type of infection. Your sexual activity has changed since you were last screened, and  you are at increased risk for chlamydia or gonorrhea. Ask your health care provider if you are at risk. Ask your health care provider about whether you are at high risk for HIV. Your health care provider may recommend a prescription medicine to help prevent HIV infection. If you choose to take medicine to prevent HIV, you should first get tested for HIV. You should then be tested every 3 months for as  long as you are taking the medicine. Pregnancy If you are about to stop having your period (premenopausal) and you may become pregnant, seek counseling before you get pregnant. Take 400 to 800 micrograms (mcg) of folic acid every day if you become pregnant. Ask for birth control (contraception) if you want to prevent pregnancy. Osteoporosis and menopause Osteoporosis is a disease in which the bones lose minerals and strength with aging. This can result in bone fractures. If you are 50 years old or older, or if you are at risk for osteoporosis and fractures, ask your health care provider if you should: Be screened for bone loss. Take a calcium or vitamin D supplement to lower your risk of fractures. Be given hormone replacement therapy (HRT) to treat symptoms of menopause. Follow these instructions at home: Alcohol use Do not drink alcohol if: Your health care provider tells you not to drink. You are pregnant, may be pregnant, or are planning to become pregnant. If you drink alcohol: Limit how much you have to: 0-1 drink a day. Know how much alcohol is in your drink. In the [place] one drink equals one 12 oz bottle of beer (355 mL), one 5 oz glass of wine (148 mL), or one 1 oz glass of hard liquor (44 mL). Lifestyle Do not use any products that contain nicotine or tobacco. These products include cigarettes, chewing tobacco, and vaping devices, such as e-cigarettes. If you need help quitting, ask your health care provider. Do not use street drugs. Do not share needles. Ask your health care provider for help if you need support or information about quitting drugs. General instructions Schedule regular health, dental, and eye exams. Stay current with your vaccines. Tell your health care provider if: You often feel depressed. You have ever been abused or do not feel safe at home. Summary Adopting a healthy lifestyle and getting preventive care are important in promoting health and  wellness. Follow your health care provider's instructions about healthy diet, exercising, and getting tested or screened for diseases. Follow your health care provider's instructions on monitoring your cholesterol and blood pressure. This information is not intended to replace advice given to you by your health care provider. Make sure you discuss any questions you have with your health care provider. Document Revised: 03/28/2021 Document Reviewed: 03/28/2021 Elsevier Patient Education  [employer].

## 2022-09-11 NOTE — Progress Notes (Addendum)
Subjective:    Patient ID: Ariel Simpson, female    DOB: 1956/04/24, 66 y.o.   MRN: [id number]      HPI Ariel Simpson is here for a Physical exam.    Probably getting a PPM soon.  Had a 4 sec pause on her loop over the summer that resulted in syncope.  Following with cardiology and waiting for the procedure to be scheduled.  She has several questions and concerns about her metoprolol.      Medications and allergies reviewed with patient and updated if appropriate.  Current Outpatient Medications on File Prior to Visit  Medication Sig Dispense Refill   amoxicillin (AMOXIL) 500 MG capsule TAKE 4 CAPSULES BY MOUTH 1 HOUR BEFORE DENTAL APPOINTMENT 4 capsule 3   aspirin 81 MG tablet Take 1 tablet (81 mg total) by mouth daily.     atorvastatin (LIPITOR) 20 MG tablet TAKE 1 TABLET BY MOUTH DAILY 90 tablet 1   Calcium Carbonate-Vitamin D (CALCIUM + D PO) Take 1 capsule by mouth daily.      metoprolol succinate (TOPROL-XL) 25 MG 24 hr tablet TAKE 1 TABLET(25 MG) BY MOUTH IN THE MORNING AND AT BEDTIME WITH OR IMMEDIATELY FOLLOWING A MEAL (Patient taking differently: 25 mg daily.  WITH OR IMMEDIATELY FOLLOWING A MEAL) 180 tablet 1   Misc Natural Products (GLUCOSAMINE CHOND COMPLEX/MSM PO) Take 1 tablet by mouth daily.     Omega-3 Fatty Acids (FISH OIL PO) Take 2 capsules by mouth daily.     polyethylene glycol (MIRALAX / GLYCOLAX) packet Take 17 g by mouth daily.     TURMERIC PO Take 1 tablet by mouth daily.      zolpidem (AMBIEN) 5 MG tablet Take 0.5 tablets (2.5 mg total) by mouth at bedtime as needed for sleep. 45 tablet 0   No current facility-administered medications on file prior to visit.    Review of Systems  Constitutional:  Negative for fever.  Eyes:  Negative for visual disturbance.  Respiratory:  Negative for cough, shortness of breath and wheezing.   Cardiovascular:  Positive for palpitations and leg swelling (trace at times). Negative for chest pain.  Gastrointestinal:   Negative for abdominal pain, blood in stool, constipation, diarrhea and nausea.  Genitourinary:  Negative for dysuria.  Musculoskeletal:  Positive for arthralgias.  Skin:  Negative for rash.  Neurological:  Positive for headaches (rare). Negative for light-headedness.  Psychiatric/Behavioral:  Negative for dysphoric mood. The patient is not nervous/anxious.        Objective:   Vitals:   09/12/22 0927  BP: 120/78  Pulse: (!) 54  Temp: 98 F (36.7 C)  SpO2: 96%   Filed Weights   09/12/22 0927  Weight: 167 lb (75.8 kg)   Body mass index is 26.16 kg/m.  BP Readings from Last 3 Encounters:  09/12/22 120/78  09/08/22 122/82  11/23/21 118/78    Wt Readings from Last 3 Encounters:  09/12/22 167 lb (75.8 kg)  09/08/22 168 lb (76.2 kg)  11/23/21 168 lb 9.6 oz (76.5 kg)       Physical Exam Constitutional: She appears well-developed and well-nourished. No distress.  HENT:  Head: Normocephalic and atraumatic.  Right Ear: External ear normal. Normal ear canal and TM Left Ear: External ear normal.  Normal ear canal and TM Mouth/Throat: Oropharynx is clear and moist.  Eyes: Conjunctivae normal.  Neck: Neck supple. No tracheal deviation present. No thyromegaly present.  No carotid bruit  Cardiovascular: Normal rate, regular rhythm and  normal heart sounds.   2/6 systolic murmur heard.  No edema. Pulmonary/Chest: Effort normal and breath sounds normal. No respiratory distress. She has no wheezes. She has no rales.  Breast: deferred   Abdominal: Soft. She exhibits no distension. There is no tenderness.  Lymphadenopathy: She has no cervical adenopathy.  Skin: Skin is warm and dry. She is not diaphoretic.  Psychiatric: She has a normal mood and affect. Her behavior is normal.     Lab Results  Component Value Date   WBC 3.9 (L) 09/08/2021   HGB 13.2 09/08/2021   HCT 38.7 09/08/2021   PLT 192.0 09/08/2021   GLUCOSE 89 09/08/2021   CHOL 154 09/08/2021   TRIG 113.0  09/08/2021   HDL 54.20 09/08/2021   LDLCALC 77 09/08/2021   ALT 18 09/08/2021   AST 23 09/08/2021   NA 139 09/08/2021   K 4.9 09/08/2021   CL 103 09/08/2021   CREATININE 0.90 09/08/2021   BUN 17 09/08/2021   CO2 31 09/08/2021   TSH 2.08 09/08/2021   INR 1.29 03/23/2013   HGBA1C 5.5 02/19/2018         Assessment & Plan:   Physical exam: Screening blood work  ordered Exercise  regular - yoga, hiking Weight  good Substance abuse  none   Reviewed recommended immunizations.   Health Maintenance  Topic Date Due   Pneumonia Vaccine 93+ Years old (1 - PCV) Never done   DEXA SCAN  02/12/2022   COVID-19 Vaccine (4 - Pfizer series) 09/28/2022 (Originally 10/19/2020)   TETANUS/TDAP  09/13/2023 (Originally 04/08/2022)   Medicare Annual Wellness (AWV)  11/27/2022   MAMMOGRAM  03/10/2024   COLONOSCOPY (Pts 45-90yr Insurance coverage will need to be confirmed)  08/25/2024   INFLUENZA VACCINE  Completed   Hepatitis C Screening  Completed   HPV VACCINES  Aged Out   Zoster Vaccines- Shingrix  Discontinued          See Problem List for Assessment and Plan of chronic medical problems.

## 2022-09-12 ENCOUNTER — Telehealth: Payer: Self-pay | Admitting: Interventional Cardiology

## 2022-09-12 ENCOUNTER — Ambulatory Visit (INDEPENDENT_AMBULATORY_CARE_PROVIDER_SITE_OTHER): Payer: Medicare Other | Admitting: Internal Medicine

## 2022-09-12 VITALS — BP 120/78 | HR 54 | Temp 98.0°F | Ht 67.0 in | Wt 167.0 lb

## 2022-09-12 DIAGNOSIS — Z8679 Personal history of other diseases of the circulatory system: Secondary | ICD-10-CM | POA: Diagnosis not present

## 2022-09-12 DIAGNOSIS — E7849 Other hyperlipidemia: Secondary | ICD-10-CM | POA: Diagnosis not present

## 2022-09-12 DIAGNOSIS — R251 Tremor, unspecified: Secondary | ICD-10-CM | POA: Diagnosis not present

## 2022-09-12 DIAGNOSIS — M85851 Other specified disorders of bone density and structure, right thigh: Secondary | ICD-10-CM

## 2022-09-12 DIAGNOSIS — G4700 Insomnia, unspecified: Secondary | ICD-10-CM

## 2022-09-12 DIAGNOSIS — Z0000 Encounter for general adult medical examination without abnormal findings: Secondary | ICD-10-CM

## 2022-09-12 DIAGNOSIS — I455 Other specified heart block: Secondary | ICD-10-CM | POA: Diagnosis not present

## 2022-09-12 DIAGNOSIS — I480 Paroxysmal atrial fibrillation: Secondary | ICD-10-CM | POA: Diagnosis not present

## 2022-09-12 LAB — COMPREHENSIVE METABOLIC PANEL
ALT: 24 U/L (ref 0–35)
AST: 24 U/L (ref 0–37)
Albumin: 4.5 g/dL (ref 3.5–5.2)
Alkaline Phosphatase: 79 U/L (ref 39–117)
BUN: 15 mg/dL (ref 6–23)
CO2: 31 mEq/L (ref 19–32)
Calcium: 10.7 mg/dL — ABNORMAL HIGH (ref 8.4–10.5)
Chloride: 102 mEq/L (ref 96–112)
Creatinine, Ser: 0.85 mg/dL (ref 0.40–1.20)
GFR: 70.22 mL/min (ref >60.00)
Glucose, Bld: 96 mg/dL (ref 70–99)
Potassium: 4.2 mEq/L (ref 3.5–5.1)
Sodium: 140 mEq/L (ref 135–145)
Total Bilirubin: 0.8 mg/dL (ref 0.2–1.2)
Total Protein: 7 g/dL (ref 6.0–8.3)

## 2022-09-12 LAB — CUP PACEART REMOTE DEVICE CHECK
Date Time Interrogation Session: 20231022230943
Implantable Pulse Generator Implant Date: 20220531

## 2022-09-12 LAB — CBC WITH DIFFERENTIAL/PLATELET
Basophils Absolute: 0 10*3/uL (ref 0.0–0.1)
Basophils Relative: 0.9 % (ref 0.0–3.0)
Eosinophils Absolute: 0.1 10*3/uL (ref 0.0–0.7)
Eosinophils Relative: 1.5 % (ref 0.0–5.0)
HCT: 41 % (ref 36.0–46.0)
Hemoglobin: 13.9 g/dL (ref 12.0–15.0)
Lymphocytes Relative: 38.3 % (ref 12.0–46.0)
Lymphs Abs: 1.8 10*3/uL (ref 0.7–4.0)
MCHC: 34 g/dL (ref 30.0–36.0)
MCV: 90.2 fl (ref 78.0–100.0)
Monocytes Absolute: 0.4 10*3/uL (ref 0.1–1.0)
Monocytes Relative: 8.4 % (ref 3.0–12.0)
Neutro Abs: 2.4 10*3/uL (ref 1.4–7.7)
Neutrophils Relative %: 50.9 % (ref 43.0–77.0)
Platelets: 223 10*3/uL (ref 150.0–400.0)
RBC: 4.54 Mil/uL (ref 3.87–5.11)
RDW: 12.6 % (ref 11.5–15.5)
WBC: 4.8 10*3/uL (ref 4.0–10.5)

## 2022-09-12 LAB — LIPID PANEL
Cholesterol: 159 mg/dL (ref 0–200)
HDL: 56.5 mg/dL (ref >39.00)
LDL Cholesterol: 81 mg/dL (ref 0–99)
NonHDL: 102.65
Total CHOL/HDL Ratio: 3
Triglycerides: 106 mg/dL (ref 0.0–149.0)
VLDL: 21.2 mg/dL (ref 0.0–40.0)

## 2022-09-12 LAB — TSH: TSH: 2.37 u[IU]/mL (ref 0.35–5.50)

## 2022-09-12 NOTE — Telephone Encounter (Signed)
Pt called back and scheduled for a PPM implant / ILR Explant with Dr. [name], on 09/20/22 at 1230 pm.   Instruction letter sent via [facility], and pt will come to [facility] at 10/27 for instruction letter and surgical scrub soap.

## 2022-09-12 NOTE — Assessment & Plan Note (Signed)
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

## 2022-09-12 NOTE — Assessment & Plan Note (Signed)
Chronic Following with cardiology On metoprolol XL 25 mg daily, aspirin 81 mg daily CBC, CMP, TSH

## 2022-09-12 NOTE — Assessment & Plan Note (Signed)
Chronic Controlled, Stable Continue Ambien 2.5 mg nightly as needed

## 2022-09-12 NOTE — Assessment & Plan Note (Addendum)
Chronic DEXA due-ordered-we will do when her next mammogram is due Regular exercise stressed Continue calcium and vitamin D daily

## 2022-09-12 NOTE — Assessment & Plan Note (Signed)
Chronic Has seen Dr. Carles Collet Currently on metoprolol which is helping-had to decrease her dose of metoprolol and has noticed increased tremor

## 2022-09-12 NOTE — Telephone Encounter (Signed)
Patient states she is returning your call. Please advise

## 2022-09-12 NOTE — Assessment & Plan Note (Signed)
Over the summer had a 4-second pause according to her loop recorder and had a syncopal episode In need of a pacemaker Awaiting for this to be scheduled

## 2022-09-12 NOTE — Assessment & Plan Note (Signed)
Chronic °Regular exercise and healthy diet encouraged °Check lipid panel  °Continue atorvastatin 20 mg daily °

## 2022-09-15 DIAGNOSIS — H5213 Myopia, bilateral: Secondary | ICD-10-CM | POA: Diagnosis not present

## 2022-09-15 DIAGNOSIS — H25813 Combined forms of age-related cataract, bilateral: Secondary | ICD-10-CM | POA: Diagnosis not present

## 2022-09-16 ENCOUNTER — Other Ambulatory Visit: Payer: Self-pay | Admitting: Primary Care

## 2022-09-17 ENCOUNTER — Other Ambulatory Visit: Payer: Self-pay | Admitting: Internal Medicine

## 2022-09-17 DIAGNOSIS — F5104 Psychophysiologic insomnia: Secondary | ICD-10-CM

## 2022-09-19 ENCOUNTER — Telehealth: Payer: Self-pay | Admitting: Internal Medicine

## 2022-09-19 DIAGNOSIS — F5104 Psychophysiologic insomnia: Secondary | ICD-10-CM

## 2022-09-19 MED ORDER — ZOLPIDEM TARTRATE 5 MG PO TABS: 2.5000 mg | ORAL_TABLET | Freq: Every evening | ORAL | 0 refills | Status: DC | PRN

## 2022-09-19 NOTE — Pre-Procedure Instructions (Signed)
Attempted to call the patient regarding procedure instructions.  Left voice mail on the following items: Arrival time 1030 Nothing to eat or drink after midnight No meds AM of procedure Responsible person to drive you home and stay with you for 24 hrs Wash with special soap night before and morning of procedure

## 2022-09-19 NOTE — Telephone Encounter (Signed)
Caller & Relationship to patient: Self  Call back number: [phone]   Date of last office visit: 10.24.23  Date of next office visit: 12.11.23  Medication(s) to be refilled:  zolpidem (AMBIEN) 5 MG tablet    Preferred Pharmacy:  [id number]  Phone:  [phone]  Fax:  [phone]

## 2022-09-20 ENCOUNTER — Ambulatory Visit (HOSPITAL_COMMUNITY): Payer: Medicare Other

## 2022-09-20 ENCOUNTER — Ambulatory Visit (HOSPITAL_COMMUNITY)
Admission: RE | Admit: 2022-09-20 | Discharge: 2022-09-20 | Disposition: A | Discharge status: Home / Self Care | Payer: Medicare Other | Source: Ambulatory Visit | Attending: Internal Medicine | Admitting: Internal Medicine

## 2022-09-20 ENCOUNTER — Other Ambulatory Visit: Payer: Self-pay

## 2022-09-20 ENCOUNTER — Ambulatory Visit (HOSPITAL_COMMUNITY)
Admission: RE | Disposition: A | Discharge status: Home / Self Care | Payer: Self-pay | Source: Ambulatory Visit | Attending: Internal Medicine

## 2022-09-20 DIAGNOSIS — R55 Syncope and collapse: Secondary | ICD-10-CM | POA: Diagnosis not present

## 2022-09-20 DIAGNOSIS — I471 Supraventricular tachycardia, unspecified: Secondary | ICD-10-CM | POA: Insufficient documentation

## 2022-09-20 DIAGNOSIS — I480 Paroxysmal atrial fibrillation: Secondary | ICD-10-CM | POA: Diagnosis not present

## 2022-09-20 DIAGNOSIS — Z950 Presence of cardiac pacemaker: Secondary | ICD-10-CM | POA: Diagnosis not present

## 2022-09-20 DIAGNOSIS — I495 Sick sinus syndrome: Secondary | ICD-10-CM | POA: Diagnosis not present

## 2022-09-20 HISTORY — PX: LOOP RECORDER REMOVAL: EP1215

## 2022-09-20 HISTORY — PX: PACEMAKER IMPLANT: EP1218

## 2022-09-20 SURGERY — PACEMAKER IMPLANT
Anesthesia: LOCAL

## 2022-09-20 MED ORDER — ACETAMINOPHEN 325 MG PO TABS
325.0000 mg | ORAL_TABLET | ORAL | Status: DC | PRN
  Administered 2022-09-20: 650 mg via ORAL
  Filled 2022-09-20: qty 2

## 2022-09-20 MED ORDER — LIDOCAINE HCL 1 % IJ SOLN
INTRAMUSCULAR | Status: AC
  Filled 2022-09-20: qty 20

## 2022-09-20 MED ORDER — MIDAZOLAM HCL 5 MG/5ML IJ SOLN
INTRAMUSCULAR | Status: DC | PRN
  Administered 2022-09-20: 2 mg via INTRAVENOUS
  Administered 2022-09-20: 1 mg via INTRAVENOUS

## 2022-09-20 MED ORDER — CEFAZOLIN SODIUM-DEXTROSE 2-4 GM/100ML-% IV SOLN
INTRAVENOUS | Status: AC
  Filled 2022-09-20: qty 100

## 2022-09-20 MED ORDER — SODIUM CHLORIDE 0.9 % IV SOLN
INTRAVENOUS | Status: AC
  Filled 2022-09-20: qty 2

## 2022-09-20 MED ORDER — SODIUM CHLORIDE 0.9 % IV SOLN: INTRAVENOUS | Status: DC

## 2022-09-20 MED ORDER — FENTANYL CITRATE (PF) 100 MCG/2ML IJ SOLN
INTRAMUSCULAR | Status: AC
  Filled 2022-09-20: qty 2

## 2022-09-20 MED ORDER — HYDRALAZINE HCL 20 MG/ML IJ SOLN
INTRAMUSCULAR | Status: AC
  Filled 2022-09-20: qty 1

## 2022-09-20 MED ORDER — ONDANSETRON HCL 4 MG/2ML IJ SOLN: 4.0000 mg | Freq: Four times a day (QID) | INTRAMUSCULAR | Status: DC | PRN

## 2022-09-20 MED ORDER — HEPARIN (PORCINE) IN NACL 1000-0.9 UT/500ML-% IV SOLN
INTRAVENOUS | Status: DC | PRN
  Administered 2022-09-20: 500 mL

## 2022-09-20 MED ORDER — LIDOCAINE HCL (PF) 1 % IJ SOLN
INTRAMUSCULAR | Status: DC | PRN
  Administered 2022-09-20: 60 mL

## 2022-09-20 MED ORDER — MIDAZOLAM HCL 5 MG/5ML IJ SOLN
INTRAMUSCULAR | Status: AC
  Filled 2022-09-20: qty 5

## 2022-09-20 MED ORDER — CEFAZOLIN SODIUM-DEXTROSE 1-4 GM/50ML-% IV SOLN
1.0000 g | Freq: Once | INTRAVENOUS | Status: AC
  Administered 2022-09-20: 1 g via INTRAVENOUS
  Filled 2022-09-20: qty 50

## 2022-09-20 MED ORDER — CHLORHEXIDINE GLUCONATE 4 % EX LIQD
4.0000 | Freq: Once | CUTANEOUS | Status: DC
  Filled 2022-09-20: qty 60

## 2022-09-20 MED ORDER — FENTANYL CITRATE (PF) 100 MCG/2ML IJ SOLN
INTRAMUSCULAR | Status: DC | PRN
  Administered 2022-09-20 (×2): 25 ug via INTRAVENOUS
  Administered 2022-09-20: 12.5 ug via INTRAVENOUS

## 2022-09-20 MED ORDER — SODIUM CHLORIDE 0.9 % IV SOLN
80.0000 mg | INTRAVENOUS | Status: AC
  Administered 2022-09-20: 80 mg

## 2022-09-20 MED ORDER — CEFAZOLIN SODIUM-DEXTROSE 2-4 GM/100ML-% IV SOLN
2.0000 g | INTRAVENOUS | Status: AC
  Administered 2022-09-20: 2 g via INTRAVENOUS

## 2022-09-20 MED ORDER — POVIDONE-IODINE 10 % EX SWAB
2.0000 | Freq: Once | CUTANEOUS | Status: AC
  Administered 2022-09-20: 2 via TOPICAL

## 2022-09-20 SURGICAL SUPPLY — 12 items

## 2022-09-20 NOTE — Interval H&P Note (Signed)
History and Physical Interval Note:  09/20/2022 10:19 AM  Ariel Simpson  has presented today for surgery, with the diagnosis of syncope.  The various methods of treatment have been discussed with the patient and family. After consideration of risks, benefits and other options for treatment, the patient has consented to  Procedure(s): PACEMAKER IMPLANT (N/A) LOOP RECORDER REMOVAL (N/A) as a surgical intervention.  The patient's history has been reviewed, patient examined, no change in status, stable for surgery.  I have reviewed the patient's chart and labs.  Questions were answered to the patient's satisfaction.     Cristopher Peru

## 2022-09-20 NOTE — Discharge Instructions (Addendum)
After Your Pacemaker   You have a [employer] Pacemaker  ACTIVITY Do not lift your arm above shoulder height for 1 week after your procedure. After 7 days, you may progress as below.  You should remove your sling 24 hours after your procedure, unless otherwise instructed by your provider.     Wednesday September 27, 2022  Thursday September 28, 2022 Friday September 29, 2022 Saturday September 30, 2022   Do not lift, push, pull, or carry anything over 10 pounds with the affected arm until 6 weeks (Wednesday November 01, 2022 ) after your procedure.   You may drive AFTER your wound check, unless you have been told otherwise by your provider.   Ask your healthcare provider when you can go back to work   INCISION/Dressing If you are on a blood thinner such as Coumadin, Xarelto, Eliquis, Plavix, or Pradaxa please confirm with your provider when this should be resumed. Aspirin  If large square, outer bandage is left in place, this can be removed after 24 hours from your procedure. Do not remove steri-strips or glue as below.   Monitor your Pacemaker site for redness, swelling, and drainage. Call the device clinic at [phone] if you experience these symptoms or fever/chills.  If your incision is sealed with Steri-strips or staples, you may shower 7 days after your procedure or when told by your provider. Do not remove the steri-strips or let the shower hit directly on your site. You may wash around your site with soap and water.    If you were discharged in a sling, please do not wear this during the day more than 48 hours after your surgery unless otherwise instructed. This may increase the risk of stiffness and soreness in your shoulder.   Avoid lotions, ointments, or perfumes over your incision until it is well-healed.  You may use a hot tub or a pool AFTER your wound check appointment if the incision is completely closed.  Pacemaker Alerts:  Some alerts are vibratory and others beep.  These are NOT emergencies. Please call our office to let us know. If this occurs at night or on weekends, it can wait until the next business day. Send a remote transmission.  If your device is capable of reading fluid status (for heart failure), you will be offered monthly monitoring to review this with you.   DEVICE MANAGEMENT Remote monitoring is used to monitor your pacemaker from home. This monitoring is scheduled every 91 days by our office. It allows [place] to keep an eye on the functioning of your device to ensure it is working properly. You will routinely see your Electrophysiologist annually (more often if necessary).   You should receive your ID card for your new device in 4-8 weeks. Keep this card with you at all times once received. Consider wearing a medical alert bracelet or necklace.  Your Pacemaker may be MRI compatible. This will be discussed at your next office visit/wound check.  You should avoid contact with strong electric or magnetic fields.   Do not use amateur (ham) radio equipment or electric (arc) welding torches. MP3 player headphones with magnets should not be used. Some devices are safe to use if held at least 12 inches (30 cm) from your Pacemaker. These include power tools, lawn mowers, and speakers. If you are unsure if something is safe to use, ask your health care provider.  When using your cell phone, hold it to the ear that is on the opposite side from the  Pacemaker. Do not leave your cell phone in a pocket over the Pacemaker.  You may safely use electric blankets, heating pads, computers, and microwave ovens.  Call the office right away if: You have chest pain. You feel more short of breath than you have felt before. You feel more light-headed than you have felt before. Your incision starts to open up.  This information is not intended to replace advice given to you by your health care provider. Make sure you discuss any questions you have with your health care  provider.

## 2022-09-21 ENCOUNTER — Telehealth: Payer: Self-pay | Admitting: Internal Medicine

## 2022-09-21 ENCOUNTER — Encounter (HOSPITAL_COMMUNITY): Payer: Self-pay | Admitting: Internal Medicine

## 2022-09-21 MED FILL — Lidocaine HCl Local Inj 1%: INTRAMUSCULAR | Qty: 60 | Status: AC

## 2022-09-21 NOTE — Telephone Encounter (Signed)
  1. Has your device fired? no  2. Is you device beeping? no  3. Are you experiencing draining or swelling at device site? no  4. Are you calling to see if we received your device transmission? no  5. Have you passed out? no  Patient had device implanted yesterday.  She is called wanting to speak to a nurse at the device clinic, she states she has itching around the primary bandage.   Please route to [place]

## 2022-09-21 NOTE — Telephone Encounter (Signed)
Follow-up after same day discharge: Implant date: 09/20/2022 MD: Cristopher Peru, MD Device: MDT PPM Location: left chest   Wound check visit: 10/04/2022 at 10:40 am 90 day MD follow-up: scheduled  Remote Transmission received:yes  Dressing/sling removed: yes  Confirm OAC restart on: n/a   Spoke with Pt.  She has some itching around the main bandage over her pacemaker.  This is to be removed today per discharge instructions.  She will remove it.  Discussed wound care for loop and pacemaker.  All questions answered.  Pt given direct number to device clinic to call with any issues.

## 2022-09-22 ENCOUNTER — Ambulatory Visit: Payer: Medicare Other | Attending: Cardiovascular Disease

## 2022-09-22 ENCOUNTER — Encounter: Payer: Self-pay | Admitting: Internal Medicine

## 2022-09-22 DIAGNOSIS — I495 Sick sinus syndrome: Secondary | ICD-10-CM

## 2022-09-22 NOTE — Telephone Encounter (Signed)
Apt scheduled for wound check today 09/22/22

## 2022-09-22 NOTE — Telephone Encounter (Signed)
The patient called because her pacemaker site is very itchy and red. She states it is hot to touch. I asked her to send a picture and the nurse will review it and give her a call back at [phone].

## 2022-09-22 NOTE — Telephone Encounter (Signed)
The patient sent two pictures to the my chart.

## 2022-09-22 NOTE — Progress Notes (Signed)
Wound site assessed by R. Charlcie Cradle PA advised patient to try benadryl for itching, patient denied fever or chills, patient advised to keep an eye on sites and call back for worsening symptoms of increased redness, swelling.

## 2022-09-26 ENCOUNTER — Encounter: Payer: Self-pay | Admitting: Optometry

## 2022-09-26 ENCOUNTER — Other Ambulatory Visit: Payer: Self-pay

## 2022-09-26 ENCOUNTER — Ambulatory Visit: Payer: Medicare Other | Admitting: Optometry

## 2022-09-26 DIAGNOSIS — D3131 Benign neoplasm of right choroid: Secondary | ICD-10-CM

## 2022-09-26 DIAGNOSIS — H5213 Myopia, bilateral: Secondary | ICD-10-CM

## 2022-09-26 DIAGNOSIS — H524 Presbyopia: Secondary | ICD-10-CM

## 2022-09-26 DIAGNOSIS — H2513 Age-related nuclear cataract, bilateral: Secondary | ICD-10-CM

## 2022-09-26 DIAGNOSIS — H52223 Regular astigmatism, bilateral: Secondary | ICD-10-CM

## 2022-09-27 ENCOUNTER — Encounter: Payer: Self-pay | Admitting: Optometry

## 2022-09-27 ENCOUNTER — Telehealth: Payer: Self-pay

## 2022-09-27 NOTE — Progress Notes (Signed)
Assessment:       1. Choroidal nevus of right eye  Previously Dx, per Px  - Fundus Photos-[facility]  OD: NL OM+ w/ 1.5 x 2DD oval nevus w/ fine drusen overlying, FAF NL  OS: FAF NL    2. Nuclear sclerotic cataract of both eyes  OS>OD    3. Myopia, bilateral      4. Regular astigmatism, bilateral      5. Presbyopia            Plan:     Rx subjective findings in PAL design as optional change. Discussed findings and addressed concerns. RTC in 21M w/ BAT, topo, Mac OCT, DFE OU for periodic care, monitor cat progression OU.

## 2022-09-27 NOTE — Telephone Encounter (Signed)
Call back received from Pt.  She feels like she is improving.  She is ok with waiting until her regularly scheduled wound check.  She is surprised regarding bruising.  Advised this is not uncommon after a pacemaker implant.  Advised she could hold her turmeric.  She states the bruising is not new and is resolving.  All questions answered.

## 2022-09-27 NOTE — Telephone Encounter (Signed)
Attempted to contact patient to follow up on ppm/ILR site and see if patient is improving. Per Joseph Art, if patient is improving can wait until device clinic apt. If patient reports not improving or any concerns patient needs follow up in device clinic for wound recheck. No answer, LMTCB.

## 2022-10-03 ENCOUNTER — Other Ambulatory Visit: Payer: Self-pay | Admitting: Internal Medicine

## 2022-10-03 DIAGNOSIS — F5104 Psychophysiologic insomnia: Secondary | ICD-10-CM

## 2022-10-04 ENCOUNTER — Ambulatory Visit: Payer: Medicare Other | Attending: Interventional Cardiology

## 2022-10-04 DIAGNOSIS — I495 Sick sinus syndrome: Secondary | ICD-10-CM

## 2022-10-04 DIAGNOSIS — Z950 Presence of cardiac pacemaker: Secondary | ICD-10-CM | POA: Diagnosis not present

## 2022-10-04 LAB — CUP PACEART INCLINIC DEVICE CHECK
Battery Remaining Longevity: 161 mo
Battery Voltage: 3.22 V
Brady Statistic AP VP Percent: 0.03 %
Brady Statistic AP VS Percent: 55.71 %
Brady Statistic AS VP Percent: 0.03 %
Brady Statistic AS VS Percent: 44.24 %
Brady Statistic RA Percent Paced: 55.5 %
Brady Statistic RV Percent Paced: 0.05 %
Date Time Interrogation Session: 20231115171537
Implantable Lead Connection Status: 753985
Implantable Lead Connection Status: 753985
Implantable Lead Implant Date: 20231101
Implantable Lead Implant Date: 20231101
Implantable Lead Location: 753859
Implantable Lead Location: 753860
Implantable Lead Model: 3830
Implantable Lead Model: 5076
Implantable Pulse Generator Implant Date: 20231101
Lead Channel Impedance Value: 342 Ohm
Lead Channel Impedance Value: 418 Ohm
Lead Channel Impedance Value: 532 Ohm
Lead Channel Impedance Value: 589 Ohm
Lead Channel Pacing Threshold Amplitude: 0.5 V
Lead Channel Pacing Threshold Amplitude: 0.75 V
Lead Channel Pacing Threshold Pulse Width: 0.4 ms
Lead Channel Pacing Threshold Pulse Width: 0.4 ms
Lead Channel Sensing Intrinsic Amplitude: 1.875 mV
Lead Channel Sensing Intrinsic Amplitude: 16.5 mV
Lead Channel Sensing Intrinsic Amplitude: 17.5 mV
Lead Channel Sensing Intrinsic Amplitude: 2.25 mV
Lead Channel Setting Pacing Amplitude: 3.5 V
Lead Channel Setting Pacing Amplitude: 3.5 V
Lead Channel Setting Pacing Pulse Width: 0.4 ms
Lead Channel Setting Sensing Sensitivity: 1.2 mV
Zone Setting Status: 755011

## 2022-10-04 NOTE — Progress Notes (Signed)

## 2022-10-04 NOTE — Patient Instructions (Signed)
   After Your Pacemaker   Monitor your pacemaker site for redness, swelling, and drainage. Call the device clinic at [phone] if you experience these symptoms or fever/chills.  Your incision was closed with Steri-strips or staples:  You may shower 7 days after your procedure and wash your incision with soap and water. Avoid lotions, ointments, or perfumes over your incision until it is well-healed.  You may use a hot tub or a pool after your wound check appointment if the incision is completely closed.  Do not lift, push or pull greater than 10 pounds with the affected arm until 6 weeks after your procedure. There are no other restrictions in arm movement after your wound check appointment. Until After December 13th.  You may drive, unless driving has been restricted by your healthcare providers.   Remote monitoring is used to monitor your pacemaker from home. This monitoring is scheduled every 91 days by our office. It allows [place] to keep an eye on the functioning of your device to ensure it is working properly. You will routinely see your Electrophysiologist annually (more often if necessary).

## 2022-10-06 NOTE — Progress Notes (Signed)
Carelink Summary Report / Loop Recorder 

## 2022-10-13 ENCOUNTER — Other Ambulatory Visit: Payer: Self-pay | Admitting: Internal Medicine

## 2022-10-16 ENCOUNTER — Ambulatory Visit: Payer: Medicare Other | Admitting: Optometry

## 2022-10-19 ENCOUNTER — Telehealth: Payer: Self-pay

## 2022-10-19 NOTE — Telephone Encounter (Signed)
Pt states she is having some bruising under her breast. I let her speak with Leigh.

## 2022-10-19 NOTE — Telephone Encounter (Signed)
Patient reports of bruising noted under her left breast. Denies pain, tenderness on palpitation or swelling. Advised patient bruising can be seen in areas other than where the device was placed due to pool of blood with gravity (for example, sitting up will cause bruising lower sometimes than incision site). Advised patient if she has tenderness, pain, swelling, fever or chills to please let us know. Offered device clinic apt to patient. States she is going out of town this weekend but if she has concerns next week she will come into the device clinic.

## 2022-10-20 ENCOUNTER — Other Ambulatory Visit: Payer: Self-pay | Admitting: Primary Care

## 2022-10-20 MED ORDER — KETOCONAZOLE 2 % EX CREA *I*
TOPICAL_CREAM | Freq: Every day | CUTANEOUS | 1 refills | Status: DC
Start: 2022-10-20 — End: 2022-10-23

## 2022-10-20 NOTE — Telephone Encounter (Signed)
Becky from patient's Pharmacy called stating the medication econazole nitrate (SPECTAZOLE) 1 % cream is not covered by insurance but Ketoconazole 2% cream is covered.

## 2022-10-21 ENCOUNTER — Other Ambulatory Visit: Payer: Self-pay | Admitting: Primary Care

## 2022-10-23 ENCOUNTER — Other Ambulatory Visit: Payer: Self-pay | Admitting: Primary Care

## 2022-10-23 MED ORDER — KETOCONAZOLE 2 % EX CREA *I*
TOPICAL_CREAM | Freq: Every day | CUTANEOUS | 1 refills | Status: DC
Start: 2022-10-23 — End: 2023-07-26

## 2022-10-25 ENCOUNTER — Ambulatory Visit: Payer: Medicare Other | Attending: Primary Care | Admitting: Primary Care

## 2022-10-25 ENCOUNTER — Other Ambulatory Visit: Payer: Self-pay

## 2022-10-25 ENCOUNTER — Encounter: Payer: Self-pay | Admitting: Primary Care

## 2022-10-25 VITALS — BP 124/76 | HR 78 | Ht 62.99 in | Wt 100.8 lb

## 2022-10-25 DIAGNOSIS — I10 Essential (primary) hypertension: Secondary | ICD-10-CM | POA: Insufficient documentation

## 2022-10-25 DIAGNOSIS — G40909 Epilepsy, unspecified, not intractable, without status epilepticus: Secondary | ICD-10-CM | POA: Insufficient documentation

## 2022-10-25 DIAGNOSIS — Z0000 Encounter for general adult medical examination without abnormal findings: Secondary | ICD-10-CM

## 2022-10-25 DIAGNOSIS — E559 Vitamin D deficiency, unspecified: Secondary | ICD-10-CM

## 2022-10-25 DIAGNOSIS — E7800 Pure hypercholesterolemia, unspecified: Secondary | ICD-10-CM

## 2022-10-25 DIAGNOSIS — E034 Atrophy of thyroid (acquired): Secondary | ICD-10-CM | POA: Insufficient documentation

## 2022-10-25 NOTE — Patient Instructions (Signed)
Thank you for completing your Subsequent Annual Medicare Visit   with [place] today.     The purpose of this visits was to:    Screen for disease  Assess risk of future medical problems  Help develop a healthy lifestyle  Update vaccines  Get to know your doctor in case of an illness    Patient Care Team:  Deeann Saint (Inactive) as PCP - CCP-AUDIOLOGY  Deirdre Priest, MD as PCP - General  Reuben Likes Chrystie Nose, AUD as PCP - CCP-AUDIOLOGY     Medicare 5 Year Plan    The following items were identified as areas of concern during your screening today:  High Blood Pressure (Hypertension) - This is a risk factor for Heart Attack, Stroke, Kidney Problems and Eye Problems.       The Health Maintenance table below identifies screening tests and immunizations recommended by your health care team:  Health Maintenance: These screening recommendations are based on USPSTF, [employer], and [place] state guidelines   Topic Date Due   . Pneumococcal Vaccination (1 - PCV) Never done   . Shingles Vaccine (2 of 3) 03/27/2014   . Colon Cancer Screening  07/12/2022   . Breast Cancer Screening  10/31/2022   . HIV Screening  08/08/2040 (Originally 12/04/1968)   . Depression Screen Yearly  10/26/2023   . Fall Risk Screening  10/26/2023   . Cervical Cancer Screening  05/18/2024   . Osteoporosis Screening  07/25/2024   . DTaP/Tdap/Td Vaccines (4 - Td or Tdap) 12/16/2028   . Flu Shot  Completed   . Hepatitis C Screening  Completed   . COVID-19 Vaccine  Completed   . Hepatitis B Vaccine  Aged Out   . Rotavirus Vaccine  Aged Out     In addition, goals and orders placed to address these recommendations are listed in the "Today's Visit" section.    We wish you the best of health and look forward to seeing you again next year for your Annual Medicare Wellness Visit.     If you have any health care concerns before then, please do not hesitate to contact us.
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
## 2022-10-29 ENCOUNTER — Encounter: Payer: Self-pay | Admitting: Primary Care

## 2022-10-30 ENCOUNTER — Ambulatory Visit (INDEPENDENT_AMBULATORY_CARE_PROVIDER_SITE_OTHER): Payer: Medicare Other

## 2022-10-30 DIAGNOSIS — Z0000 Encounter for general adult medical examination without abnormal findings: Secondary | ICD-10-CM

## 2022-10-30 NOTE — Patient Instructions (Signed)
It was great speaking with you today!  Please schedule your next Medicare Wellness Visit with your Nurse Health Advisor in 1 year by calling [phone]. 

## 2022-10-30 NOTE — Progress Notes (Signed)
Subjective:   Ariel Simpson is a 66 y.o. female who presents for Medicare Annual (Subsequent) preventive examination.  I connected with Marthann today by telephone and verified that I am speaking with the correct person using two identifiers. I discussed the limitations, risks, security and privacy concerns of performing an evaluation and management service by telephone and the availability of in person appointments. I also discussed with the patient that there may be a patient responsible charge related to this service. The patient expressed understanding and agreed to proceed. Location patient: home Location provider: Pietro Cassis Persons participating in the visit: [place] and Jillene Bucks, [place].  Time Spent with patient on telephone encounter: 12 mins  Review of Systems    No ROS. Medicare Wellness Telephone Visit. Additional risk factors are reflected in social history. Cardiac Risk Factors include: advanced age (>73mn, >>35women);dyslipidemia     Objective:    There were no vitals filed for this visit. There is no height or weight on file to calculate BMI.     10/30/2022   10:14 AM 09/20/2022    7:02 AM 10/28/2021    1:12 PM 09/14/2020    8:46 AM 01/16/2020    8:22 AM 08/26/2019    6:59 AM 03/18/2013   12:26 AM  Advanced Directives  Does Patient Have a Medical Advance Directive? Yes Yes Yes Yes Yes Yes Patient has advance directive, copy in chart  Type of Advance Directive [place] will [place]Living will Living will;Healthcare Power of Attorney Living will;Healthcare Power of A[place]Living will [place]Living will HPotter LakeLiving will  Does patient want to make changes to medical advance directive? No - Patient declined No - Patient declined No - Patient declined No - Patient declined     Copy of [place] Chart?   No - copy  requested No - copy requested  Yes - validated most recent copy scanned in chart (See row information)   Pre-existing out of facility DNR order (yellow form or pink MOST form)       No    Current Medications (verified) Outpatient Encounter Medications as of 10/30/2022  Medication Sig   aspirin 81 MG tablet Take 1 tablet (81 mg total) by mouth daily.   atorvastatin (LIPITOR) 20 MG tablet TAKE 1 TABLET BY MOUTH DAILY   BIOTIN PO Place 1 patch onto the skin daily.   Glucosamine Sulfate-MSM (MSM-GLUCOSAMINE EX) Place 1 patch onto the skin daily.   metoprolol succinate (TOPROL-XL) 25 MG 24 hr tablet TAKE 1 TABLET(25 MG) BY MOUTH IN THE MORNING AND AT BEDTIME WITH OR IMMEDIATELY FOLLOWING A MEAL (Patient taking differently: Take 25 mg by mouth daily. Taking once daily)   Omega-3 Fatty Acids (FISH OIL PO) Take 2 capsules by mouth daily.   polyethylene glycol (MIRALAX / GLYCOLAX) packet Take 17 g by mouth daily.   Propylene Glycol, PF, (SYSTANE COMPLETE PF) 0.6 % SOLN Place 1 drop into both eyes as needed (dry eyes).   TURMERIC PO Take 1 tablet by mouth daily.    zolpidem (AMBIEN) 5 MG tablet TAKE 1/2 TABLET(2.5 MG) BY MOUTH AT BEDTIME AS NEEDED FOR SLEEP   amoxicillin (AMOXIL) 500 MG capsule TAKE 4 CAPSULES BY MOUTH 1 HOUR BEFORE DENTAL APPOINTMENT (Patient not taking: Reported on 10/30/2022)   No facility-administered encounter medications on file as of 10/30/2022.    Allergies (verified) Patient has no known allergies.  History: Past Medical History:  Diagnosis Date   Arthritis    Benign essential tremor    CHF (congestive heart failure) (HCC)    Heart murmur    High cholesterol    Hypertension    Insomnia    Mitral regurgitation    Mitral valve prolapse    Osteopenia    Paroxysmal atrial fibrillation (HCC) 03/11/2013   Paroxysmal supraventricular tachycardia    PONV (postoperative nausea and vomiting)    Dizziness and Nausea   S/P Maze operation for atrial fibrillation 03/18/2013    Complete bilateral atrial lesion set using cryothermy and biopolar radiofrequency ablation with oversewing of LA appendage via right mini thoracotomy   S/P mitral valve repair 03/18/2013   Complex valvuloplasty including triangular resection of posterior leaflet, artificial Gore-tex neocord placement x4 and 25m Sorin Memo 3D ring annuloplasty via right mini thoracotomy   Severe mitral regurgitation 03/03/2013   Acute on chronic with acute heart failure    Shortness of breath    Wears glasses    Past Surgical History:  Procedure Laterality Date   ABDOMINAL HYSTERECTOMY     hernia repair   BIOPSY  08/26/2019   Procedure: BIOPSY;  Surgeon: SWilford Corner MD;  Location: [facility];  Service: Endoscopy;;   CARDIAC CATHETERIZATION     COLONOSCOPY     COLONOSCOPY WITH PROPOFOL N/A 08/26/2019   Procedure: COLONOSCOPY WITH PROPOFOL;  Surgeon: SWilford Corner MD;  Location: [facility];  Service: Endoscopy;  Laterality: N/A;   INTRAOPERATIVE TRANSESOPHAGEAL ECHOCARDIOGRAM N/A 03/18/2013   Procedure: INTRAOPERATIVE TRANSESOPHAGEAL ECHOCARDIOGRAM;  Surgeon: CRexene Alberts MD;  [facility]  Service: Open Heart Surgery;  Laterality: N/A;   LEFT AND RIGHT HEART CATHETERIZATION WITH CORONARY ANGIOGRAM N/A 03/06/2013   Procedure: LEFT AND RIGHT HEART CATHETERIZATION WITH CORONARY ANGIOGRAM;  Surgeon: HSinclair Grooms MD;  [facility];  Service: Cardiovascular;  Laterality: N/A;   LOOP RECORDER REMOVAL N/A 09/20/2022   Procedure: LOOP RECORDER REMOVAL;  Surgeon: TEvans Lance MD;  [facility];  Service: Cardiovascular;  Laterality: N/A;   MAZE N/A 03/18/2013   Procedure: MAZE;  Surgeon: CRexene Alberts MD;  [place] [facility]  Service: Open Heart Surgery;  Laterality: N/A;   MITRAL VALVE REPAIR Right 03/18/2013   Procedure: MINIMALLY INVASIVE MITRAL VALVE REPAIR (MVR);  Surgeon: CRexene Alberts MD;  [place] [facility]  Service: Open Heart Surgery;  Laterality:  Right;   PACEMAKER IMPLANT N/A 09/20/2022   Procedure: PACEMAKER IMPLANT;  Surgeon: TEvans Lance MD;  [facility];  Service: Cardiovascular;  Laterality: N/A;   VARICOSE VEIN SURGERY     WISDOM TOOTH EXTRACTION     Family History  Problem Relation Age of Onset   Pancreatic cancer Mother    Stomach cancer Father    Hypertension Father    Thyroid cancer Sister    Colon cancer Paternal Grandmother    Stomach cancer Paternal Grandfather    Seizures Child    Down syndrome Child    Social History   Socioeconomic History   Marital status: Married    Spouse name: LFritz Pickerel  Number of children: 3   Years of education: BS   Highest education level: Not on file  Occupational History    Employer: [employer]  Tobacco Use   Smoking status: Never   Smokeless tobacco: Never  Vaping Use   Vaping Use: Never used  Substance and Sexual Activity   Alcohol  use: Yes    Alcohol/week: 5.0 - 6.0 standard drinks of alcohol    Types: 5 - 6 Glasses of wine per week    Comment: 1 drink/day   Drug use: No   Sexual activity: Yes    Birth control/protection: Surgical  Other Topics Concern   Not on file  Social History Narrative   Patient lives at home with her husband  Fritz Pickerel)  - second husband. Patient has college education B.S. 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   Caffeine- one cup daily.   Social Determinants of Health   Financial Resource Strain: Low Risk  (10/30/2022)   Overall Financial Resource Strain (CARDIA)    Difficulty of Paying Living Expenses: Not hard at all  Food Insecurity: No Food Insecurity (10/30/2022)   Hunger Vital Sign    Worried About Running Out of Food in the Last Year: Never true    Ran Out of Food in the Last Year: Never true  Transportation Needs: No Transportation Needs (10/30/2022)   PRAPARE - Hydrologist (Medical): No    Lack of Transportation (Non-Medical): No  Physical  Activity: Sufficiently Active (10/30/2022)   Exercise Vital Sign    Days of Exercise per Week: 6 days    Minutes of Exercise per Session: 60 min  Stress: No Stress Concern Present (10/30/2022)   [place]    Feeling of Stress : Not at all  Social Connections: Moderately Integrated (10/30/2022)   Social Connection and Isolation Panel [NHANES]    Frequency of Communication with Friends and Family: Three times a week    Frequency of Social Gatherings with Friends and Family: Three times a week    Attends Religious Services: Never    Active Member of Clubs or Organizations: Yes    Attends Music therapist: More than 4 times per year    Marital Status: Married    Tobacco Counseling Counseling given: Not Answered   Clinical Intake:  Pre-visit preparation completed: Yes  Pain : No/denies pain     BMI - recorded: 26 Nutritional Status: BMI 25 -29 Overweight Nutritional Risks: None Diabetes: No  How often do you need to have someone help you when you read instructions, pamphlets, or other written materials from your doctor or pharmacy?: 1 - Never What is the last grade level you completed in school?: bachelor's degree   Interpreter Needed?: No  Information entered by :: Jillene Bucks, CMA   Activities of Daily Living    10/30/2022   10:15 AM 10/27/2022    8:56 AM  In your present state of health, do you have any difficulty performing the following activities:  Hearing? 0 0  Vision? 0 0  Difficulty concentrating or making decisions? 0 0  Walking or climbing stairs? 0 0  Dressing or bathing? 0 0  Doing errands, shopping? 0 0  Preparing Food and eating ? N N  Using the Toilet? N N  In the past six months, have you accidently leaked urine? N N  Do you have problems with loss of bowel control? N N  Managing your Medications? N N  Managing your Finances? N N  Housekeeping or managing your  Housekeeping? N N    Patient Care Team: Binnie Rail, MD as PCP - General (Internal Medicine) Belva Crome, MD as PCP - Cardiology (Cardiology) Rexene Alberts, MD (Inactive) as Consulting  Physician (Cardiothoracic Surgery) Belva Crome, MD as Consulting Physician (Cardiology) Tat, Eustace Quail, DO as Consulting Physician (Neurology) Charlton Haws, [facility] as Pharmacist (Pharmacist) Melissa Noon, [place] as Referring Physician (Optometry)  Indicate any recent Medical Services you may have received from other than [facility] providers in the past year (date may be approximate).     Assessment:   This is a routine wellness examination for Loveta.  Hearing/Vision screen Patient denied any hearing difficulty. No hearing aids. Patient wears corrective lenses.   Dietary issues and exercise activities discussed: Current Exercise Habits: Structured exercise class;Home exercise routine, Type of exercise: yoga;walking, Time (Minutes): > 60, Frequency (Times/Week): 6, Weekly Exercise (Minutes/Week): 0, Intensity: Mild, Exercise limited by: None identified   Goals Addressed             This Visit's Progress    Patient Stated       Stay active       Depression Screen    10/30/2022   10:13 AM 09/12/2022    9:50 AM 10/28/2021    1:19 PM 09/14/2020    8:42 AM 09/06/2020    1:40 PM 09/05/2019    8:12 AM 02/19/2018    1:35 PM  PHQ 2/9 Scores  PHQ - 2 Score 0 0 0 1 0 0 0    Fall Risk    10/30/2022   10:14 AM 10/27/2022    8:56 AM 09/12/2022    9:49 AM 10/28/2021    1:14 PM 09/14/2020    8:46 AM  Fall Risk   Falls in the past year? 1 1 0 0 0  Number falls in past yr: 0 0 0 0 0  Injury with Fall? 0 0 0 0 0  Risk for fall due to : No Fall Risks  No Fall Risks No Fall Risks No Fall Risks  Follow up Falls evaluation completed  Falls evaluation completed Falls prevention discussed Falls evaluation completed    [place]:  Any stairs in or around  the home? Yes  If so, are there any without handrails? No  Home free of loose throw rugs in walkways, pet beds, electrical cords, etc? Yes  Adequate lighting in your home to reduce risk of falls? Yes   ASSISTIVE DEVICES UTILIZED TO PREVENT FALLS:  Life alert? No  Use of a cane, walker or w/c? No  Grab bars in the bathroom? Yes  Shower chair or bench in shower? Yes  Elevated toilet seat or a handicapped toilet? Yes   TIMED UP AND GO:  Was the test performed? No .  Length of time to ambulate 10 feet: N/A sec.   Patient stated that she has no issues with gait or balance; does not use any assistive devices.  Cognitive Function:  Patient is cogitatively intact.      10/30/2022   10:15 AM  6CIT Screen  What Year? 0 points  What month? 0 points  What time? 0 points  Count back from 20 0 points  Months in reverse 0 points  Repeat phrase 0 points  Total Score 0 points    Immunizations Immunization History  Administered Date(s) Administered   Fluad Quad(high Dose 65+) 09/05/2019, 09/06/2020, 08/24/2021, 08/09/2022   Influenza-Unspecified 08/20/2017   PFIZER(Purple Top)SARS-COV-2 Vaccination 12/12/2019, 12/31/2019, 08/24/2020   Tdap 04/08/2012   Zoster, Live 04/07/2015    TDAP status: Due, Education has been provided regarding the importance of this vaccine. Advised may receive this vaccine at local pharmacy or  Health Dept. Aware to provide a copy of the vaccination record if obtained from local pharmacy or Health Dept. Verbalized acceptance and understanding.  Flu Vaccine status: Up to date  Pneumococcal vaccine status: Due, Education has been provided regarding the importance of this vaccine. Advised may receive this vaccine at local pharmacy or Health Dept. Aware to provide a copy of the vaccination record if obtained from local pharmacy or Health Dept. Verbalized acceptance and understanding.  Covid-19 vaccine status: Completed vaccines  Qualifies for Shingles Vaccine?  Yes   Zostavax completed No   Shingrix Completed?: No.    Education has been provided regarding the importance of this vaccine. Patient has been advised to call insurance company to determine out of pocket expense if they have not yet received this vaccine. Advised may also receive vaccine at local pharmacy or Health Dept. Verbalized acceptance and understanding.  Screening Tests Health Maintenance  Topic Date Due   Pneumonia Vaccine 41+ Years old (1 - PCV) Never done   DEXA SCAN  02/12/2022   DTaP/Tdap/Td (2 - Td or Tdap) 04/08/2022   COVID-19 Vaccine (4 - 2023-24 season) 11/15/2022 (Originally 07/21/2022)   Medicare Annual Wellness (AWV)  10/31/2023   MAMMOGRAM  03/10/2024   COLONOSCOPY (Pts 45-6yr Insurance coverage will need to be confirmed)  08/25/2024   INFLUENZA VACCINE  Completed   Hepatitis C Screening  Completed   HPV VACCINES  Aged Out   Zoster Vaccines- Shingrix  Discontinued    Health Maintenance  Health Maintenance Due  Topic Date Due   Pneumonia Vaccine 66 Years old (1 - PCV) Never done   DEXA SCAN  02/12/2022   DTaP/Tdap/Td (2 - Td or Tdap) 04/08/2022    Colorectal cancer screening: Type of screening: Colonoscopy. Completed 08/26/2019. Repeat every 5 years  Mammogram status: Completed 03/10/2022. Repeat every year  Bone Density status: Ordered 09/12/22. Pt provided with contact info and advised to call to schedule appt.  Lung Cancer Screening: (Low Dose CT Chest recommended if Age 68-80years, 30 pack-year currently smoking OR have quit w/in 15years.) does not qualify.   Lung Cancer Screening Referral: N/A  Additional Screening:  Hepatitis C Screening: does qualify; Completed 08/22/2017  Vision Screening: Recommended annual ophthalmology exams for early detection of glaucoma and other disorders of the eye. Is the patient up to date with their annual eye exam?  Yes  Who is the provider or what is the name of the office in which the patient attends annual eye  exams? Dr. JMelissa NoonIf pt is not established with a provider, would they like to be referred to a provider to establish care? No .   Dental Screening: Recommended annual dental exams for proper oral hygiene  Community Resource Referral / Chronic Care Management: CRR required this visit?  No   CCM required this visit?  No      Plan:     I have personally reviewed and noted the following in the patient's chart:   Medical and social history Use of alcohol, tobacco or illicit drugs  Current medications and supplements including opioid prescriptions. Patient is not currently taking opioid prescriptions. Functional ability and status Nutritional status Physical activity Advanced directives List of other physicians Hospitalizations, surgeries, and ER visits in previous 12 months Vitals Screenings to include cognitive, depression, and falls Referrals and appointments  In addition, I have reviewed and discussed with patient certain preventive protocols, quality metrics, and best practice recommendations. A written personalized care plan for preventive services as  well as general preventive health recommendations were provided to patient.     Rossie Muskrat, [place]   10/30/2022   Nurse Notes:  Patient is aware of due immunizations and plans to get them done at her pharmacy.

## 2022-10-31 ENCOUNTER — Other Ambulatory Visit: Payer: Self-pay | Admitting: Primary Care

## 2022-11-03 ENCOUNTER — Other Ambulatory Visit: Payer: Self-pay | Admitting: Primary Care

## 2022-11-05 NOTE — Progress Notes (Unsigned)
Cardiology Office Note:    Date:  11/07/2022   ID:  Ariel Simpson, DOB 1956/02/27, MRN [id number]  PCP:  Binnie Rail, MD  Cardiologist:  Sinclair Grooms, MD   Referring MD: Binnie Rail, MD   Chief Complaint  Patient presents with   Cardiac Valve Problem   Atrial Fibrillation   Follow-up    History of Present Illness:    Ariel Simpson is a 66 y.o. female with a hx of mitral valve prolapse, significant mitral regurgitation resulting in mitral valve repair in 2013, paroxysmal atrial fibrillation preceding surgery, operative Maze, remote history of PSVT, and recurring presyncope and syncope (x 1) with negative neuro evaluation but suspicious for neurally mediated, and ILR 10/2021.  ILR identified a 4 second daytime pause in relation to an episode of syncope. This led to implantation of a permanent pacemaker, 09/08/2022.   The patient has many questions.  She has an essential tremor.  She has a prior history of SVT.  Prior to the recent syncopal episode and symptomatic sinus pause that led to syncope, she was on metoprolol succinate 25 mg twice daily.  Beta-blocker therapy was discontinued and pacemaker was placed.  She is now back on 25 mg/day because the tremor became much worse off beta-blocker therapy.  She has had no SVT or significant palpitations since pacemaker implantation.  The most recent echocardiogram performed demonstrated mild to moderate mitral regurgitation and mild mitral stenosis now 10 years status post repair.  Past Medical History:  Diagnosis Date   Arthritis    Benign essential tremor    CHF (congestive heart failure) (HCC)    Heart murmur    High cholesterol    Hypertension    Insomnia    Mitral regurgitation    Mitral valve prolapse    Osteopenia    Paroxysmal atrial fibrillation (HCC) 03/11/2013   Paroxysmal supraventricular tachycardia    PONV (postoperative nausea and vomiting)    Dizziness and Nausea   S/P Maze operation for atrial  fibrillation 03/18/2013   Complete bilateral atrial lesion set using cryothermy and biopolar radiofrequency ablation with oversewing of LA appendage via right mini thoracotomy   S/P mitral valve repair 03/18/2013   Complex valvuloplasty including triangular resection of posterior leaflet, artificial Gore-tex neocord placement x4 and 23m Sorin Memo 3D ring annuloplasty via right mini thoracotomy   Severe mitral regurgitation 03/03/2013   Acute on chronic with acute heart failure    Shortness of breath    Wears glasses     Past Surgical History:  Procedure Laterality Date   ABDOMINAL HYSTERECTOMY     hernia repair   BIOPSY  08/26/2019   Procedure: BIOPSY;  Surgeon: SWilford Corner MD;  Location: [facility];  Service: Endoscopy;;   CARDIAC CATHETERIZATION     COLONOSCOPY     COLONOSCOPY WITH PROPOFOL N/A 08/26/2019   Procedure: COLONOSCOPY WITH PROPOFOL;  Surgeon: SWilford Corner MD;  Location: [facility];  Service: Endoscopy;  Laterality: N/A;   INTRAOPERATIVE TRANSESOPHAGEAL ECHOCARDIOGRAM N/A 03/18/2013   Procedure: INTRAOPERATIVE TRANSESOPHAGEAL ECHOCARDIOGRAM;  Surgeon: CRexene Alberts MD;  [place] [facility]  Service: Open Heart Surgery;  Laterality: N/A;   LEFT AND RIGHT HEART CATHETERIZATION WITH CORONARY ANGIOGRAM N/A 03/06/2013   Procedure: LEFT AND RIGHT HEART CATHETERIZATION WITH CORONARY ANGIOGRAM;  Surgeon: HSinclair Grooms MD;  [facility];  Service: Cardiovascular;  Laterality: N/A;   LOOP RECORDER REMOVAL N/A 09/20/2022   Procedure: LOOP RECORDER REMOVAL;  Surgeon:  Evans Lance, MD;  Location: [place] [facility];  Service: Cardiovascular;  Laterality: N/A;   MAZE N/A 03/18/2013   Procedure: MAZE;  Surgeon: Rexene Alberts, MD;  Location: [place];  Service: Open Heart Surgery;  Laterality: N/A;   MITRAL VALVE REPAIR Right 03/18/2013   Procedure: MINIMALLY INVASIVE MITRAL VALVE REPAIR (MVR);  Surgeon: Rexene Alberts, MD;  Location: [place];  Service: Open Heart  Surgery;  Laterality: Right;   PACEMAKER IMPLANT N/A 09/20/2022   Procedure: PACEMAKER IMPLANT;  Surgeon: Evans Lance, MD;  Location: [place] [facility];  Service: Cardiovascular;  Laterality: N/A;   VARICOSE VEIN SURGERY     WISDOM TOOTH EXTRACTION      Current Medications: Current Meds  Medication Sig   amoxicillin (AMOXIL) 500 MG capsule TAKE 4 CAPSULES BY MOUTH 1 HOUR BEFORE DENTAL APPOINTMENT   aspirin 81 MG tablet Take 1 tablet (81 mg total) by mouth daily.   atorvastatin (LIPITOR) 20 MG tablet TAKE 1 TABLET BY MOUTH DAILY   BIOTIN PO Place 1 patch onto the skin daily.   Glucosamine Sulfate-MSM (MSM-GLUCOSAMINE EX) Place 1 patch onto the skin daily.   losartan (COZAAR) 25 MG tablet Take 1 tablet (25 mg total) by mouth daily.   metoprolol succinate (TOPROL XL) 25 MG 24 hr tablet Take 1 tablet (25 mg total) by mouth 2 (two) times daily.   Omega-3 Fatty Acids (FISH OIL PO) Take 2 capsules by mouth daily.   polyethylene glycol (MIRALAX / GLYCOLAX) packet Take 17 g by mouth daily.   Propylene Glycol, PF, (SYSTANE COMPLETE PF) 0.6 % SOLN Place 1 drop into both eyes as needed (dry eyes).   TURMERIC PO Take 1 tablet by mouth daily.    zolpidem (AMBIEN) 5 MG tablet TAKE 1/2 TABLET(2.5 MG) BY MOUTH AT BEDTIME AS NEEDED FOR SLEEP   [DISCONTINUED] metoprolol succinate (TOPROL-XL) 25 MG 24 hr tablet TAKE 1 TABLET(25 MG) BY MOUTH IN THE MORNING AND AT BEDTIME WITH OR IMMEDIATELY FOLLOWING A MEAL (Patient taking differently: Take 25 mg by mouth daily. Taking once daily)     Allergies:   Patient has no known allergies.   Social History   Socioeconomic History   Marital status: Married    Spouse name: Fritz Pickerel   Number of children: 3   Years of education: BS   Highest education level: Not on file  Occupational History    Employer: [employer]  Tobacco Use   Smoking status: Never   Smokeless tobacco: Never  Vaping Use   Vaping Use: Never used  Substance and Sexual Activity    Alcohol use: Yes    Alcohol/week: 5.0 - 6.0 standard drinks of alcohol    Types: 5 - 6 Glasses of wine per week    Comment: 1 drink/day   Drug use: No   Sexual activity: Yes    Birth control/protection: Surgical  Other Topics Concern   Not on file  Social History Narrative   Patient lives at home with her husband  Fritz Pickerel)  - second husband. Patient has college education B.S. 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   Caffeine- one cup daily.   Social Determinants of Health   Financial Resource Strain: Low Risk  (10/30/2022)   Overall Financial Resource Strain (CARDIA)    Difficulty of Paying Living Expenses: Not hard at all  Food Insecurity: No Food Insecurity (10/30/2022)   Hunger Vital Sign  Worried About Charity fundraiser in the Last Year: Never true    [place] in the Last Year: Never true  Transportation Needs: No Transportation Needs (10/30/2022)   PRAPARE - Hydrologist (Medical): No    Lack of Transportation (Non-Medical): No  Physical Activity: Sufficiently Active (10/30/2022)   Exercise Vital Sign    Days of Exercise per Week: 6 days    Minutes of Exercise per Session: 60 min  Stress: No Stress Concern Present (10/30/2022)   [place]    Feeling of Stress : Not at all  Social Connections: Moderately Integrated (10/30/2022)   Social Connection and Isolation Panel [NHANES]    Frequency of Communication with Friends and Family: Three times a week    Frequency of Social Gatherings with Friends and Family: Three times a week    Attends Religious Services: Never    Active Member of Clubs or Organizations: Yes    Attends Music therapist: More than 4 times per year    Marital Status: Married     Family History: The patient's family history includes Colon cancer in her paternal grandmother; Down syndrome in her child;  Hypertension in her father; Pancreatic cancer in her mother; Seizures in her child; Stomach cancer in her father and paternal grandfather; Thyroid cancer in her sister.  ROS:   Please see the history of present illness.    Tremor has been troublesome since decreasing the dose of metoprolol.  No recurrent syncopal episodes since pacemaker implantation by Dr. [name]. All other systems reviewed and are negative.  EKGs/Labs/Other Studies Reviewed:    The following studies were reviewed today:  2D Doppler echocardiogram October 2023: IMPRESSIONS     1. Left ventricular ejection fraction, by estimation, is 65 to 70%. The  left ventricle has normal function. The left ventricle has no regional  wall motion abnormalities. Diastolic function indeterminant due to MVR.  The average left ventricular global  longitudinal strain is -22.7 %. The global longitudinal strain is normal.   2. Right ventricular systolic function is normal. The right ventricular  size is mildly enlarged. There is mildly elevated pulmonary artery  systolic pressure. The estimated right ventricular systolic pressure is  78.4 mmHg.   3. Left atrial size was moderately dilated.   4. Right atrial size was mildly dilated.   5. The mitral valve has been repaired/replaced. There is a mitral valve  annuloplasty ring present. There is mild-to-moderate mitral stenosis with  mean gradient 1mHg at HR 71bpm. EOA 1.6cm2. There is highly eccentric  mitral regurgitation that appears  mild-to-moderate by color flow doppler but may be underestimated due to  eccentricity of the jet.   6. Tricuspid valve regurgitation is mild to moderate.   7. The aortic valve is tricuspid. There is mild calcification of the  aortic valve. There is mild thickening of the aortic valve. Aortic valve  regurgitation is trivial. Aortic valve sclerosis/calcification is present,  without any evidence of aortic  stenosis.   8. Aortic dilatation noted. There is  borderline dilatation of the aortic  root, measuring 36 mm. There is borderline dilatation of the ascending  aorta, measuring 39 mm.   9. The inferior vena cava is normal in size with greater than 50%  respiratory variability, suggesting right atrial pressure of 3 mmHg.   Comparison(s): Compared to prior echo report in 2021, the LVEF remains  stable. Mean mitral gradient  has increased from 68mHg to 756mg. MR now  appears mild to moderate (previously mild).   EKG:  EKG not repeated  Recent Labs: 09/12/2022: ALT 24; BUN 15; Creatinine, Ser 0.85; Hemoglobin 13.9; Platelets 223.0; Potassium 4.2; Sodium 140; TSH 2.37  Recent Lipid Panel    Component Value Date/Time   CHOL 159 09/12/2022 1028   CHOL 176 08/25/2019 1134   TRIG 106.0 09/12/2022 1028   HDL 56.50 09/12/2022 1028   HDL 66 08/25/2019 1134   CHOLHDL 3 09/12/2022 1028   VLDL 21.2 09/12/2022 1028   LDLCALC 81 09/12/2022 1028   LDLCALC 95 08/25/2019 1134    Physical Exam:    VS:  BP 122/80 (BP Location: Right Leg, Patient Position: Sitting, Cuff Size: Normal)   Pulse 65   Ht '5\' 7"'$  (1.702 m)   Wt 170 lb 8 oz (77.3 kg)   SpO2 98%   BMI 26.70 kg/m     Wt Readings from Last 3 Encounters:  11/07/22 170 lb 8 oz (77.3 kg)  09/20/22 165 lb (74.8 kg)  09/12/22 167 lb (75.8 kg)     GEN: Healthy appearing with weight slightly up compared to 2 months ago.. No acute distress HEENT: Normal NECK: No JVD. LYMPHATICS: No lymphadenopathy CARDIAC: 2-3/6 left lower sternal apical and left axillary systolic murmur of mitral regurgitation. RRR no gallop, or edema. VASCULAR:  Normal Pulses. No bruits. RESPIRATORY:  Clear to auscultation without rales, wheezing or rhonchi  ABDOMEN: Soft, non-tender, non-distended, No pulsatile mass, MUSCULOSKELETAL: No deformity  SKIN: Warm and dry NEUROLOGIC:  Alert and oriented x 3 PSYCHIATRIC:  Normal affect   ASSESSMENT:    1. S/P mitral valve repair   2. Essential hypertension   3.  Paroxysmal atrial fibrillation (HCC)   4. Syncope, unspecified syncope type   5. Sinus node dysfunction (HCC)   6. Essential tremor   7. Moderate mitral regurgitation    PLAN:    In order of problems listed above:  Concerning finding of possible increased in mitral regurgitation when compared to prior study in 2021.  Her blood pressures have not been under the best of control as near syncope and syncope led to reduction in beta-blocker and antihypertensive intensity.  Plan to start losartan 25 mg/day and uptitrate to achieve blood pressure control less than 130/80 mmHg.  Repeat echo should be done in 1 year.  I recommended that she establish with Dr. HeGwyndolyn KaufmanBlood pressure is not well-controlled.  Increase Toprol-XL to 25 mg twice daily.  She tolerates this higher dose is being used for both blood pressure and tremor, in 1 to 2 weeks the losartan should be started and optimized for blood pressure control less than 130/80 mmHg. No recurrences.  Baby aspirin will be continued. No recurrence since pacemaker implantation.  Syncope was documented by loop recorder to be related to sinus arrest with the pauses greater than 4 seconds. Now being treated with permanent pacemaker done in October. Increase beta-blocker to better control tremor. Recommend repeat echo in 1 year with excellent blood pressure control including the addition of ACE inhibitor/ARB therapy plus minus diuretic as needed.   Dr. PeJohney Framen 3 months to reassess blood pressure on beta-blocker therapy and ARB. Consider repeat echocardiogram in 1 year.  Mild to moderate mitral regurgitation does not require any particular action at this time other than better blood pressure control.   Medication Adjustments/Labs and Tests Ordered: Current medicines are reviewed at length with the patient today.  Concerns regarding medicines  are outlined above.  No orders of the defined types were placed in this encounter.  Meds ordered  this encounter  Medications   metoprolol succinate (TOPROL XL) 25 MG 24 hr tablet    Sig: Take 1 tablet (25 mg total) by mouth 2 (two) times daily.    Dispense:  180 tablet    Refill:  3    Dose change, increased from daily to twice daily.   losartan (COZAAR) 25 MG tablet    Sig: Take 1 tablet (25 mg total) by mouth daily.    Dispense:  90 tablet    Refill:  3    Patient Instructions  Medication Instructions:  Your physician has recommended you make the following change in your medication:   1) INCREASE metoprolol succinate (Toprol XL) '25mg'$  to twice daily 2) START Losartan '25mg'$  daily (start first week of January)  Blood pressure goal is less than 130/80 mmHg.  *If you need a refill on your cardiac medications before your next appointment, please call your pharmacy*  Follow-Up: At [facility], you and your health needs are our priority.  As part of our continuing mission to provide you with exceptional heart care, we have created designated Provider Care Teams.  These Care Teams include your primary Cardiologist (physician) and Advanced Practice Providers (APPs -  Physician Assistants and Nurse Practitioners) who all work together to provide you with the care you need, when you need it.  Your next appointment:   2-3 month(s)  The format for your next appointment:   In Person  Provider:   Gwyndolyn Kaufman, MD  Important Information About Sugar         Signed, Sinclair Grooms, MD  11/07/2022 9:00 AM    [place]

## 2022-11-07 ENCOUNTER — Encounter: Payer: Self-pay | Admitting: Interventional Cardiology

## 2022-11-07 ENCOUNTER — Ambulatory Visit: Payer: Medicare Other | Attending: Interventional Cardiology | Admitting: Interventional Cardiology

## 2022-11-07 VITALS — BP 122/80 | HR 65 | Ht 67.0 in | Wt 170.5 lb

## 2022-11-07 DIAGNOSIS — G250 Essential tremor: Secondary | ICD-10-CM | POA: Diagnosis not present

## 2022-11-07 DIAGNOSIS — R55 Syncope and collapse: Secondary | ICD-10-CM | POA: Diagnosis not present

## 2022-11-07 DIAGNOSIS — I340 Nonrheumatic mitral (valve) insufficiency: Secondary | ICD-10-CM | POA: Diagnosis not present

## 2022-11-07 DIAGNOSIS — Z9889 Other specified postprocedural states: Secondary | ICD-10-CM | POA: Diagnosis not present

## 2022-11-07 DIAGNOSIS — I495 Sick sinus syndrome: Secondary | ICD-10-CM | POA: Diagnosis not present

## 2022-11-07 DIAGNOSIS — I480 Paroxysmal atrial fibrillation: Secondary | ICD-10-CM | POA: Diagnosis not present

## 2022-11-07 DIAGNOSIS — I10 Essential (primary) hypertension: Secondary | ICD-10-CM | POA: Diagnosis not present

## 2022-11-07 MED ORDER — LOSARTAN POTASSIUM 25 MG PO TABS: 25.0000 mg | ORAL_TABLET | Freq: Every day | ORAL | 3 refills | Status: DC

## 2022-11-07 MED ORDER — METOPROLOL SUCCINATE ER 25 MG PO TB24: 25.0000 mg | ORAL_TABLET | Freq: Two times a day (BID) | ORAL | 3 refills | Status: DC

## 2022-11-07 NOTE — Patient Instructions (Addendum)
Medication Instructions:  Your physician has recommended you make the following change in your medication:   1) INCREASE metoprolol succinate (Toprol XL) '25mg'$  to twice daily 2) START Losartan '25mg'$  daily (start first week of January)  Blood pressure goal is less than 130/80 mmHg.  *If you need a refill on your cardiac medications before your next appointment, please call your pharmacy*  Follow-Up: At [facility], you and your health needs are our priority.  As part of our continuing mission to provide you with exceptional heart care, we have created designated Provider Care Teams.  These Care Teams include your primary Cardiologist (physician) and Advanced Practice Providers (APPs -  Physician Assistants and Nurse Practitioners) who all work together to provide you with the care you need, when you need it.  Your next appointment:   2-3 month(s)  The format for your next appointment:   In Person  Provider:   Gwyndolyn Kaufman, MD  Important Information About Sugar

## 2022-11-08 ENCOUNTER — Telehealth: Payer: Self-pay | Admitting: Primary Care

## 2022-11-08 NOTE — Telephone Encounter (Signed)
carBAMazepine ER 100MG  er capsules were approved through 12.31.23

## 2022-11-08 NOTE — Telephone Encounter (Signed)
Prior Auth needed for medication:  carBAMazepine (CARBATROL) 100 mg 12 hr capsule  TAKE 1 CAPSULE BY MOUTH TWO TIMES DAILY  Disp-180 capsule, R-2

## 2022-11-28 ENCOUNTER — Ambulatory Visit: Payer: Medicare Other | Admitting: Neurology

## 2022-12-09 ENCOUNTER — Other Ambulatory Visit
Admission: RE | Admit: 2022-12-09 | Discharge: 2022-12-09 | Disposition: A | Discharge status: Home / Self Care | Payer: Medicare Other | Source: Ambulatory Visit | Attending: Obstetrics | Admitting: Obstetrics

## 2022-12-09 DIAGNOSIS — M810 Age-related osteoporosis without current pathological fracture: Secondary | ICD-10-CM | POA: Insufficient documentation

## 2022-12-09 LAB — BASIC METABOLIC PANEL
Anion Gap: 12 (ref 7–16)
CO2: 28 mmol/L (ref 20–28)
Calcium: 9.9 mg/dL (ref 8.6–10.2)
Chloride: 99 mmol/L (ref 96–108)
Creatinine: 0.56 mg/dL (ref 0.51–0.95)
Glucose: 96 mg/dL (ref 60–99)
Lab: 15 mg/dL (ref 6–20)
Potassium: 4.4 mmol/L (ref 3.3–5.1)
Sodium: 139 mmol/L (ref 133–145)
eGFR BY CREAT: 99 *

## 2022-12-09 LAB — MAGNESIUM: Magnesium: 2.2 mg/dL (ref 1.6–2.5)

## 2022-12-12 ENCOUNTER — Other Ambulatory Visit: Payer: Self-pay

## 2022-12-12 ENCOUNTER — Ambulatory Visit: Payer: Medicare Other | Attending: Neurology | Admitting: Neurology

## 2022-12-12 VITALS — BP 135/65 | HR 76 | Temp 97.7°F | Wt 100.0 lb

## 2022-12-12 DIAGNOSIS — G40909 Epilepsy, unspecified, not intractable, without status epilepticus: Secondary | ICD-10-CM | POA: Insufficient documentation

## 2022-12-12 NOTE — Progress Notes (Signed)
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  Neurologically she has been very stable since our last visit in July.    She and her husband recently moved into a smaller condominium and have sold their house.  She did have 1 fall in the midst of the move but did not injure herself.  Similar to previous episodes she was holding onto her husband when this happened and he was able to help her fall gently to the ground.    They continue to follow fairly strict COVID precautions.  She has had no recent infections.  She continues to work about 10 to 15 hours weekly which she greatly enjoys.  She keeps herself cognitively engaged with some degree of socialization, maintaining awareness of current events and reading.  Examination:  Vitals:    12/12/22 0907   BP: 135/65   Pulse: 76   Temp: 36.5 C (97.7 F)   Weight: 45.4 kg (100 lb)     She is alert and fully oriented.  Her speech is clear.  Comprehension is normal.  Her affect is appropriate.    Visual fields are full to confrontation.  She has full versions without nystagmus strength appears intact.  She continues to have bilateral sustention tremor again slightly worse on the left.  It interferes modestly with day-to-day activities.  Her gait remains mildly unsteady.  She walks with a mild degree of antalgia and turns slowly.    Impression: Overall she is stable, status post AVM resection and residual seizure disorders.  She remains on carbamazepine 500 mg twice daily.  She will hold on any physical therapy at this time.  I plan on seeing her again in 6 months.

## 2022-12-16 NOTE — Progress Notes (Unsigned)
Assessment/Plan:   1.  Essential Tremor.  -This is evidenced by the symmetrical nature and longstanding hx of gradually getting worse.  We discussed nature and pathophysiology.  We discussed that this can continue to gradually get worse with time.  We discussed that some medications can worsen this, as can caffeine use.  We discussed medication therapy as well as surgical therapy.  We discussed primidone in detail but she feels she is doing pretty well with her low dose metoprolol and doesn't want that right now.  She asks about ET studies in [place] with botox and we discussed those and the nature of them.    2.  History of recurrent syncope, now known to be due to sinus pauses  -Syncope captured on loop recorder and patient now with permanent pacemaker  -Following with cardiology.  Her cardiologist, Dr. Tamala Julian, has retired and she has a follow-up appointment with Dr. Johney Frame  3.  PAF, per cardiology notes  -Currently not on anticoagulants.  If she gets placed on DOAC's, she will not be eligible for primidone due to interaction.   Subjective:   TAIJAH MACRAE was seen in consultation in the movement disorder clinic at the request of Burns, Claudina Lick, MD.  The evaluation is for tremor.  I have seen the patient previously, but the last visit was over 3 years ago.  I saw her in 2019, she had asymmetric essential tremor, with the right side being greater than the left.  We decided to follow her, because she did have a little bit of rest tremor.  I saw her back in 2021, and her examination was stable, and she opted to hold on any treatment.  She presents today for further evaluation.  Since our last visit, she has been dealing with cardiology for syncope.  She had a loop recorder implanted in December, 2022 because of recurrent syncope.  That loop recorder captured a 4-second daytime pause in relationship to a syncopal episode.  After that, a pacemaker was implanted.  This was implanted in October,  2023.  Per cardiology notes, when the pacemaker was implanted the beta-blocker was stopped, but her tremor became worse, so that was reinstituted.  She was on metoprolol, 25 mg twice per day and this was just increased back to that dose in December, 2023.  At that point in time, losartan was also added because of poor blood pressure control.  Tremor has been going on for over a decade.   Tremor is most noticeable when writing and doing things like tweezing eyebrows.  Tremor is variable from day to day.  The R is worse than the L and she is R hand dominant.   She can feel it some in legs in yoga positions.   She also wonders if she has some vocal tremor intermittently, when doing breathing exercises in yoga.  There is a family hx of tremor in daughter/son but she wonders if they inherited from father.    Affected by caffeine: she is not drinking caffeine at all Affected by alcohol:  unknown Affected by stress:  not usually Spills soup if on spoon:  Yes.  , notes it at [place] when eating soup Spills glass of liquid if full:  not with water/juice but notes it with a wine glass so uses L hand for that.    Current/Previously tried tremor medications: metoprolol  Current medications that may exacerbate tremor:  n/a  Outside reports reviewed: lab reports, office notes, and referral letter/letters.  No Known Allergies  Current Meds  Medication Sig   amoxicillin (AMOXIL) 500 MG capsule TAKE 4 CAPSULES BY MOUTH 1 HOUR BEFORE DENTAL APPOINTMENT   aspirin 81 MG tablet Take 1 tablet (81 mg total) by mouth daily.   atorvastatin (LIPITOR) 20 MG tablet TAKE 1 TABLET BY MOUTH DAILY   BIOTIN PO Place 1 patch onto the skin daily.   Glucosamine Sulfate-MSM (MSM-GLUCOSAMINE EX) Place 1 patch onto the skin daily.   losartan (COZAAR) 25 MG tablet Take 1 tablet (25 mg total) by mouth daily.   metoprolol succinate (TOPROL XL) 25 MG 24 hr tablet Take 1 tablet (25 mg total) by mouth 2 (two) times daily.    Omega-3 Fatty Acids (FISH OIL PO) Take 2 capsules by mouth daily.   polyethylene glycol (MIRALAX / GLYCOLAX) packet Take 17 g by mouth daily.   Propylene Glycol, PF, (SYSTANE COMPLETE PF) 0.6 % SOLN Place 1 drop into both eyes as needed (dry eyes).   TURMERIC PO Take 1 tablet by mouth daily.    zolpidem (AMBIEN) 5 MG tablet TAKE 1/2 TABLET(2.5 MG) BY MOUTH AT BEDTIME AS NEEDED FOR SLEEP      Objective:   VITALS:   Vitals:   12/18/22 0927  BP: 122/84  Pulse: 83  SpO2: 93%  Weight: 172 lb 6.4 oz (78.2 kg)  Height: '5\' 7"'$  (1.702 m)    GEN:  The patient appears stated age and is in NAD. HEENT:  Normocephalic, atraumatic.  The mucous membranes are moist. The superficial temporal arteries are without ropiness or tenderness. CV:  RRR Lungs:  CTAB Neck/HEME:  There are no carotid bruits bilaterally.   Neurological examination:   Orientation: The patient is alert and oriented x3.  Cranial nerves: There is good facial symmetry.  Extraocular muscles are intact. The visual fields are full to confrontational testing. The speech is fluent and clear. Soft palate rises symmetrically and there is no tongue deviation. Hearing is intact to conversational tone. Sensation: Sensation is intact to light touch throughout Motor: Strength is 5/5 in the bilateral upper and lower extremities.   Shoulder shrug is equal and symmetric.  There is no pronator drift.    Movement examination: Tone: There is normal tone in the bilateral upper extremities.  The tone in the lower extremities is normal.  Abnormal movements: There is mild tremor of both outstretched arms, R more than L.  no rest tremor.  There is a tremor when given a weight, R>L, mild.  Doesn't spill water when pouring from one glass to another but tremor evident when water in R hand. Coordination:  There is no decremation with RAM's, with any form of RAMS, including alternating supination and pronation of the forearm, hand opening and closing, finger  taps, heel taps and toe taps Gait and Station: The patient ambulates well  I have reviewed and interpreted the following labs independently   Chemistry      Component Value Date/Time   NA 140 09/12/2022 1028   NA 140 08/19/2020 1054   K 4.2 09/12/2022 1028   CL 102 09/12/2022 1028   CO2 31 09/12/2022 1028   BUN 15 09/12/2022 1028   BUN 12 08/19/2020 1054   CREATININE 0.85 09/12/2022 1028   CREATININE 0.96 10/10/2013 1606      Component Value Date/Time   CALCIUM 10.7 (H) 09/12/2022 1028   ALKPHOS 79 09/12/2022 1028   AST 24 09/12/2022 1028   ALT 24 09/12/2022 1028   BILITOT 0.8 09/12/2022 1028  BILITOT 0.6 08/25/2019 1134      Lab Results  Component Value Date   WBC 4.8 09/12/2022   HGB 13.9 09/12/2022   HCT 41.0 09/12/2022   MCV 90.2 09/12/2022   PLT 223.0 09/12/2022   Lab Results  Component Value Date   TSH 2.37 09/12/2022      Total time spent on today's visit was 45 minutes, including both face-to-face time and nonface-to-face time.  Time included that spent on review of records (prior notes available to me/labs/imaging if pertinent), discussing treatment and goals, answering patient's questions and coordinating care.  CC:  Binnie Rail, MD

## 2022-12-18 ENCOUNTER — Ambulatory Visit: Payer: Medicare Other | Admitting: Neurology

## 2022-12-18 ENCOUNTER — Encounter: Payer: Self-pay | Admitting: Neurology

## 2022-12-18 VITALS — BP 122/84 | HR 83 | Ht 67.0 in | Wt 172.4 lb

## 2022-12-18 DIAGNOSIS — R55 Syncope and collapse: Secondary | ICD-10-CM | POA: Diagnosis not present

## 2022-12-18 DIAGNOSIS — G250 Essential tremor: Secondary | ICD-10-CM | POA: Diagnosis not present

## 2022-12-18 NOTE — Patient Instructions (Signed)
Essential Tremor ?A tremor is trembling or shaking that a person cannot control. Most tremors affect the hands or arms. Tremors can also affect the head, vocal cords, legs, and other parts of the body. Essential tremor is a tremor without a known cause. Usually, it occurs while a person is trying to perform an action. It tends to get worse gradually as a person ages. ?What are the causes? ?The cause of this condition is not known, but it often runs in families. ?What increases the risk? ?You are more likely to develop this condition if: ?You have a family member with essential tremor. ?You are 40 years of age or older. ?What are the signs or symptoms? ?The main sign of a tremor is a rhythmic shaking of certain parts of your body that is uncontrolled and unintentional. You may: ?Have difficulty eating with a spoon or fork. ?Have difficulty writing. ?Nod your head up and down or side to side. ?Have a quivering voice. ?The shaking may: ?Get worse over time. ?Come and go. ?Be more noticeable on one side of your body. ?Get worse due to stress, tiredness (fatigue), caffeine, and extreme heat or cold. ?How is this diagnosed? ?This condition may be diagnosed based on: ?Your symptoms and medical history. ?A physical exam. ?There is no single test to diagnose an essential tremor. However, your health care provider may order tests to rule out other causes of your condition. These may include: ?Blood and urine tests. ?Imaging studies of your brain, such as a CT scan or MRI. ?How is this treated? ?Treatment for essential tremor depends on the severity of the condition. ?Mild tremors may not need treatment if they do not affect your day-to-day life. ?Severe tremors may need to be treated using one or more of the following options: ?Medicines. ?Injections of a substance called botulinum toxin. ?Procedures such as deep brain stimulation (DBS) implantation or MRI-guided ultrasound treatment. ?Lifestyle changes. ?Occupational or  physical therapy. ?Follow these instructions at home: ?Lifestyle ? ?Do not use any products that contain nicotine or tobacco. These products include cigarettes, chewing tobacco, and vaping devices, such as e-cigarettes. If you need help quitting, ask your health care provider. ?Limit your caffeine intake as told by your health care provider. ?Try to get 8 hours of sleep each night. ?Find ways to manage your stress that fit your lifestyle and personality. Consider trying meditation or yoga. ?Try to anticipate stressful situations and allow extra time to manage them. ?If you are struggling emotionally with the effects of your tremor, consider working with a mental health provider. ?General instructions ?Take over-the-counter and prescription medicines only as told by your health care provider. ?Avoid extreme heat and extreme cold. ?Keep all follow-up visits. This is important. Visits may include physical therapy visits. ?Where to find more information ?National Institute of Neurological Disorders and Stroke: www.ninds.nih.gov ?Contact a health care provider if: ?You experience any changes in the location or intensity of your tremors. ?You start having a tremor after starting a new medicine. ?You have a tremor with other symptoms, such as: ?Numbness. ?Tingling. ?Pain. ?Weakness. ?Your tremor gets worse. ?Your tremor interferes with your daily life. ?You feel down, blue, or sad for at least 2 weeks in a row. ?Worrying about your tremor and what other people think about you interferes with your everyday life functions, including relationships, work, or school. ?Summary ?Essential tremor is a tremor without a known cause. Usually, it occurs when you are trying to perform an action. ?You are more likely   to develop this condition if you have a family member with essential tremor. ?The main sign of a tremor is a rhythmic shaking of certain parts of your body that is uncontrolled and unintentional. ?Treatment for essential  tremor depends on the severity of the condition. ?This information is not intended to replace advice given to you by your health care provider. Make sure you discuss any questions you have with your health care provider. ?Document Revised: 08/26/2021 Document Reviewed: 08/26/2021 ?Elsevier Patient Education ? 2023 Elsevier Inc. ? ?

## 2022-12-20 ENCOUNTER — Telehealth: Payer: Self-pay | Admitting: Primary Care

## 2022-12-20 ENCOUNTER — Ambulatory Visit: Payer: Medicare Other

## 2022-12-20 DIAGNOSIS — I495 Sick sinus syndrome: Secondary | ICD-10-CM

## 2022-12-20 DIAGNOSIS — M79671 Pain in right foot: Secondary | ICD-10-CM

## 2022-12-20 LAB — CUP PACEART REMOTE DEVICE CHECK
Battery Remaining Longevity: 168 mo
Battery Voltage: 3.2 V
Brady Statistic AP VP Percent: 0.02 %
Brady Statistic AP VS Percent: 78.8 %
Brady Statistic AS VP Percent: 0.02 %
Brady Statistic AS VS Percent: 21.16 %
Brady Statistic RA Percent Paced: 79 %
Brady Statistic RV Percent Paced: 0.04 %
Date Time Interrogation Session: 20240130210529
Implantable Lead Connection Status: 753985
Implantable Lead Connection Status: 753985
Implantable Lead Implant Date: 20231101
Implantable Lead Implant Date: 20231101
Implantable Lead Location: 753859
Implantable Lead Location: 753860
Implantable Lead Model: 3830
Implantable Lead Model: 5076
Implantable Pulse Generator Implant Date: 20231101
Lead Channel Impedance Value: 323 Ohm
Lead Channel Impedance Value: 361 Ohm
Lead Channel Impedance Value: 551 Ohm
Lead Channel Impedance Value: 570 Ohm
Lead Channel Pacing Threshold Amplitude: 0.75 V
Lead Channel Pacing Threshold Amplitude: 0.875 V
Lead Channel Pacing Threshold Pulse Width: 0.4 ms
Lead Channel Pacing Threshold Pulse Width: 0.4 ms
Lead Channel Sensing Intrinsic Amplitude: 18.625 mV
Lead Channel Sensing Intrinsic Amplitude: 18.625 mV
Lead Channel Sensing Intrinsic Amplitude: 2.75 mV
Lead Channel Sensing Intrinsic Amplitude: 2.75 mV
Lead Channel Setting Pacing Amplitude: 1.5 V
Lead Channel Setting Pacing Amplitude: 2 V
Lead Channel Setting Pacing Pulse Width: 0.4 ms
Lead Channel Setting Sensing Sensitivity: 1.2 mV
Zone Setting Status: 755011

## 2022-12-20 MED ORDER — GENERIC DME *A*
0 refills | Status: DC
Start: 2022-12-20 — End: 2022-12-20

## 2022-12-20 MED ORDER — GENERIC DME *A*
0 refills | Status: DC
Start: 2022-12-20 — End: 2023-01-25

## 2022-12-20 NOTE — Telephone Encounter (Signed)
Pt needs 12 visits instead of 1 visit.

## 2022-12-20 NOTE — Telephone Encounter (Signed)
In my outbox, pls fax

## 2022-12-20 NOTE — Addendum Note (Signed)
Addended by: Deirdre Priest on: 12/20/2022 03:12 PM     Modules accepted: Orders

## 2022-12-20 NOTE — Telephone Encounter (Signed)
Script Faxed to [facility] [phone]

## 2022-12-20 NOTE — Telephone Encounter (Signed)
Yes, reprinted

## 2022-12-20 NOTE — Telephone Encounter (Signed)
Patient called to request a referral to  PT.   Patient would like a referral to [facility] (ph: [phone]), as she went there previous. Referral requested for overall strengthening and conditioning focusing on rt foot and back.

## 2022-12-26 ENCOUNTER — Encounter: Payer: Medicare Other | Admitting: Internal Medicine

## 2023-01-05 ENCOUNTER — Ambulatory Visit: Payer: Medicare Other | Admitting: Cardiology

## 2023-01-09 NOTE — Progress Notes (Signed)
Cardiology Office Note:    Date:  01/12/2023   ID:  FRONIE MAGNIN, DOB [birth year], MRN [id number]  PCP:  Binnie Rail, MD   [facility] Cardiologist:  Sinclair Grooms, MD (Inactive) {  Referring MD: Binnie Rail, MD   History of Present Illness:    Ariel Simpson is a 67 y.o. female with a hx of  mitral valve prolapse with severe MR s/p mitral valve repair in 2013, paroxysmal atrial fibrillation preceding surgery, operative Maze, remote history of PSVT, and recurring presyncope and syncope with 4s pause on ILR s/p PPM placement who now returns to clinic for follow-up.  Patient was last seen by Dr. Tamala Julian on 11/07/22 where she had resumed the metop due to essential tremor. TTE 08/2022 with LVEF 65-70%, mild RV enlargement, mild PHTN, moderate LAE, mild RAE, MVR with mild to moderate MS with mean gradient 63mHg, EOA 1.6cm2, mild to moderate MR, mild to moderate TR, aortic dilation of 3107m  Today, the patient overall feels well today. Has occasional palpitations which are overall well controlled with metoprolol. No chest pain, SOB, LE edema, lightheadedness or dizziness. She is active and goes to the gym and does yoga, walks and climbs stairs and feels well with exertion. Blood pressure is well controlled.   Past Medical History:  Diagnosis Date   Arthritis    Benign essential tremor    CHF (congestive heart failure) (HCC)    Heart murmur    High cholesterol    Hypertension    Insomnia    Mitral regurgitation    Mitral valve prolapse    Osteopenia    Paroxysmal atrial fibrillation (HCC) 03/11/2013   Paroxysmal supraventricular tachycardia    PONV (postoperative nausea and vomiting)    Dizziness and Nausea   S/P Maze operation for atrial fibrillation 03/18/2013   Complete bilateral atrial lesion set using cryothermy and biopolar radiofrequency ablation with oversewing of LA appendage via right mini thoracotomy   S/P mitral valve repair 03/18/2013    Complex valvuloplasty including triangular resection of posterior leaflet, artificial Gore-tex neocord placement x4 and 3077morin Memo 3D ring annuloplasty via right mini thoracotomy   Severe mitral regurgitation 03/03/2013   Acute on chronic with acute heart failure    Shortness of breath    Wears glasses     Past Surgical History:  Procedure Laterality Date   ABDOMINAL HYSTERECTOMY     hernia repair   BIOPSY  08/26/2019   Procedure: BIOPSY;  Surgeon: SchWilford CornerD;  Location: [facility];  Service: Endoscopy;;   CARDIAC CATHETERIZATION     COLONOSCOPY     COLONOSCOPY WITH PROPOFOL N/A 08/26/2019   Procedure: COLONOSCOPY WITH PROPOFOL;  Surgeon: SchWilford CornerD;  Location: [facility];  Service: Endoscopy;  Laterality: N/A;   INTRAOPERATIVE TRANSESOPHAGEAL ECHOCARDIOGRAM N/A 03/18/2013   Procedure: INTRAOPERATIVE TRANSESOPHAGEAL ECHOCARDIOGRAM;  Surgeon: ClaRexene AlbertsD;  [facility];  Laterality: N/A;   LEFT AND RIGHT HEART CATHETERIZATION WITH CORONARY ANGIOGRAM N/A 03/06/2013   Procedure: LEFT AND RIGHT HEART CATHETERIZATION WITH CORONARY ANGIOGRAM;  Surgeon: HenSinclair GroomsD;  [facility];  Service: Cardiovascular;  Laterality: N/A;   LOOP RECORDER REMOVAL N/A 09/20/2022   Procedure: LOOP RECORDER REMOVAL;  Surgeon: TayEvans LanceD;  Location: [facility] [place] [facility];  Service: Cardiovascular;  Laterality: N/A;   MAZE N/A 03/18/2013   Procedure: MAZE;  Surgeon: ClaRexene AlbertsD;  [facility];  Service: Open Heart Surgery;  Laterality: N/A;   MITRAL VALVE REPAIR Right 03/18/2013   Procedure: MINIMALLY INVASIVE MITRAL VALVE REPAIR (MVR);  Surgeon: Rexene Alberts, MD;  Location: [facility];  Service: Open Heart Surgery;  Laterality: Right;   PACEMAKER IMPLANT N/A 09/20/2022   Procedure: PACEMAKER IMPLANT;  Surgeon: Evans Lance, MD;  Location: [place] [facility];  Service: Cardiovascular;  Laterality: N/A;   VARICOSE  VEIN SURGERY     WISDOM TOOTH EXTRACTION      Current Medications: Current Meds  Medication Sig   amoxicillin (AMOXIL) 500 MG capsule TAKE 4 CAPSULES BY MOUTH 1 HOUR BEFORE DENTAL APPOINTMENT   aspirin 81 MG tablet Take 1 tablet (81 mg total) by mouth daily.   atorvastatin (LIPITOR) 20 MG tablet TAKE 1 TABLET BY MOUTH DAILY   BIOTIN PO Place 1 patch onto the skin daily.   Glucosamine Sulfate-MSM (MSM-GLUCOSAMINE EX) Place 1 patch onto the skin daily.   losartan (COZAAR) 25 MG tablet Take 1 tablet (25 mg total) by mouth daily.   metoprolol succinate (TOPROL XL) 25 MG 24 hr tablet Take 1 tablet (25 mg total) by mouth 2 (two) times daily.   Omega-3 Fatty Acids (FISH OIL PO) Take 2 capsules by mouth daily.   polyethylene glycol (MIRALAX / GLYCOLAX) packet Take 17 g by mouth daily.   Propylene Glycol, PF, (SYSTANE COMPLETE PF) 0.6 % SOLN Place 1 drop into both eyes as needed (dry eyes).   TURMERIC PO Take 1 tablet by mouth daily.    zolpidem (AMBIEN) 5 MG tablet TAKE 1/2 TABLET(2.5 MG) BY MOUTH AT BEDTIME AS NEEDED FOR SLEEP     Allergies:   Patient has no known allergies.   Social History   Socioeconomic History   Marital status: Married    Spouse name: Fritz Pickerel   Number of children: 3   Years of education: BS   Highest education level: Not on file  Occupational History    Employer: [employer]  Tobacco Use   Smoking status: Never   Smokeless tobacco: Never  Vaping Use   Vaping Use: Never used  Substance and Sexual Activity   Alcohol use: Yes    Alcohol/week: 5.0 - 6.0 standard drinks of alcohol    Types: 5 - 6 Glasses of wine per week    Comment: 1 drink/day   Drug use: No   Sexual activity: Yes    Birth control/protection: Surgical  Other Topics Concern   Not on file  Social History Narrative   Patient lives at home with her husband  Fritz Pickerel)  - second husband. Patient has college education B.S. 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    Caffeine- one cup daily.   Social Determinants of Health   Financial Resource Strain: Low Risk  (10/30/2022)   Overall Financial Resource Strain (CARDIA)    Difficulty of Paying Living Expenses: Not hard at all  Food Insecurity: No Food Insecurity (10/30/2022)   Hunger Vital Sign    Worried About Running Out of Food in the Last Year: Never true    Ran Out of Food in the Last Year: Never true  Transportation Needs: No Transportation Needs (10/30/2022)   PRAPARE - Hydrologist (Medical): No    Lack of Transportation (Non-Medical): No  Physical Activity: Sufficiently Active (10/30/2022)   Exercise Vital Sign    Days of Exercise per Week: 6  days    Minutes of Exercise per Session: 60 min  Stress: No Stress Concern Present (10/30/2022)   [place]    Feeling of Stress : Not at all  Social Connections: Moderately Integrated (10/30/2022)   Social Connection and Isolation Panel [NHANES]    Frequency of Communication with Friends and Family: Three times a week    Frequency of Social Gatherings with Friends and Family: Three times a week    Attends Religious Services: Never    Active Member of Clubs or Organizations: Yes    Attends Music therapist: More than 4 times per year    Marital Status: Married     Family History: The patient's family history includes Colon cancer in her paternal grandmother; Down syndrome in her child; Hypertension in her father; Pancreatic cancer in her mother; Seizures in her child; Stomach cancer in her father and paternal grandfather; Thyroid cancer in her sister.  ROS:   Please see the history of present illness.     All other systems reviewed and are negative.  EKGs/Labs/Other Studies Reviewed:    The following studies were reviewed today: 2D Doppler echocardiogram October 2023: IMPRESSIONS     1. Left ventricular ejection fraction, by  estimation, is 65 to 70%. The  left ventricle has normal function. The left ventricle has no regional  wall motion abnormalities. Diastolic function indeterminant due to MVR.  The average left ventricular global  longitudinal strain is -22.7 %. The global longitudinal strain is normal.   2. Right ventricular systolic function is normal. The right ventricular  size is mildly enlarged. There is mildly elevated pulmonary artery  systolic pressure. The estimated right ventricular systolic pressure is  [id number] mmHg.   3. Left atrial size was moderately dilated.   4. Right atrial size was mildly dilated.   5. The mitral valve has been repaired/replaced. There is a mitral valve  annuloplasty ring present. There is mild-to-moderate mitral stenosis with  mean gradient 73mHg at HR 71bpm. EOA 1.6cm2. There is highly eccentric  mitral regurgitation that appears  mild-to-moderate by color flow doppler but may be underestimated due to  eccentricity of the jet.   6. Tricuspid valve regurgitation is mild to moderate.   7. The aortic valve is tricuspid. There is mild calcification of the  aortic valve. There is mild thickening of the aortic valve. Aortic valve  regurgitation is trivial. Aortic valve sclerosis/calcification is present,  without any evidence of aortic  stenosis.   8. Aortic dilatation noted. There is borderline dilatation of the aortic  root, measuring 36 mm. There is borderline dilatation of the ascending  aorta, measuring 39 mm.   9. The inferior vena cava is normal in size with greater than 50%  respiratory variability, suggesting right atrial pressure of 3 mmHg.   Comparison(s): Compared to prior echo report in 2021, the LVEF remains  stable. Mean mitral gradient has increased from 412mg to 70m370m. MR now  appears mild to moderate (previously mild).   EKG:  EKG is not ordered today.   Recent Labs: 09/12/2022: ALT 24; BUN 15; Creatinine, Ser 0.85; Hemoglobin 13.9; Platelets 223.0;  Potassium 4.2; Sodium 140; TSH 2.37  Recent Lipid Panel    Component Value Date/Time   CHOL 159 09/12/2022 1028   CHOL 176 08/25/2019 1134   TRIG 106.0 09/12/2022 1028   HDL 56.50 09/12/2022 1028   HDL 66 08/25/2019 1134   CHOLHDL 3 09/12/2022 1028  VLDL 21.2 09/12/2022 1028   LDLCALC 81 09/12/2022 1028   LDLCALC 95 08/25/2019 1134     Risk Assessment/Calculations:          Physical Exam:    VS:  BP 128/88   Pulse 81   Ht '5\' 7"'$  (1.702 m)   Wt 170 lb (77.1 kg)   SpO2 98%   BMI 26.63 kg/m     Wt Readings from Last 3 Encounters:  01/12/23 170 lb (77.1 kg)  12/18/22 172 lb 6.4 oz (78.2 kg)  11/07/22 170 lb 8 oz (77.3 kg)     GEN:  Well nourished, well developed in no acute distress HEENT: Normal NECK: No JVD; No carotid bruits CARDIAC: RRR, 2/6 systolic murmur RESPIRATORY:  Clear to auscultation without rales, wheezing or rhonchi  ABDOMEN: Soft, non-tender, non-distended MUSCULOSKELETAL:  No edema; No deformity  SKIN: Warm and dry NEUROLOGIC:  Alert and oriented x 3 PSYCHIATRIC:  Normal affect   ASSESSMENT:    1. S/P mitral valve repair   2. Essential hypertension   3. Sinus node dysfunction (HCC)   4. Paroxysmal atrial fibrillation ([place])   5. Moderate mitral regurgitation   6. Hyperlipidemia LDL goal <100    PLAN:    In order of problems listed above:  #Severe MR s/p MVR: #Mild to Moderate MR: Patient is s/p mitral valve repair in 2013. Last TTE 08/2022 with EF 65-70%, mean mitral gradient 24mHg, mild to moderate MR. Currently, doing well without HF or anginal symptoms. -Continue serial monitoring with yearly echoes  -Continue metop '25mg'$  XL BID  #HTN: Well controlled and at goal <130/90. -Continue metop '25mg'$  XL BID -Increase losartan to '25mg'$  BID (prefers BID dosing over daily) -Check BMET next week  #Paroxysmal Afib: No recurrence s/p MAZE. Not on AC. -Continue ASA '81mg'$  daily  #SSS s/p PPM placement: -Follow-up with EP as  scheduled  #HLD: -Continue lipitor '20mg'$  daily -LDL 81, 08/2022      Medication Adjustments/Labs and Tests Ordered: Current medicines are reviewed at length with the patient today.  Concerns regarding medicines are outlined above.  No orders of the defined types were placed in this encounter.  No orders of the defined types were placed in this encounter.   There are no Patient Instructions on file for this visit.   Signed, HFreada Bergeron MD  01/12/2023 8:42 AM    [place]

## 2023-01-11 ENCOUNTER — Ambulatory Visit: Payer: Medicare Other | Admitting: Cardiology

## 2023-01-12 ENCOUNTER — Ambulatory Visit: Payer: Medicare Other | Attending: Cardiology | Admitting: Cardiology

## 2023-01-12 ENCOUNTER — Encounter: Payer: Self-pay | Admitting: Internal Medicine

## 2023-01-12 ENCOUNTER — Encounter: Payer: Self-pay | Admitting: Cardiology

## 2023-01-12 ENCOUNTER — Telehealth: Payer: Self-pay

## 2023-01-12 VITALS — BP 128/88 | HR 81 | Ht 67.0 in | Wt 170.0 lb

## 2023-01-12 DIAGNOSIS — I10 Essential (primary) hypertension: Secondary | ICD-10-CM | POA: Diagnosis not present

## 2023-01-12 DIAGNOSIS — Z9889 Other specified postprocedural states: Secondary | ICD-10-CM

## 2023-01-12 DIAGNOSIS — I480 Paroxysmal atrial fibrillation: Secondary | ICD-10-CM | POA: Diagnosis not present

## 2023-01-12 DIAGNOSIS — Z954 Presence of other heart-valve replacement: Secondary | ICD-10-CM | POA: Diagnosis not present

## 2023-01-12 DIAGNOSIS — Z79899 Other long term (current) drug therapy: Secondary | ICD-10-CM | POA: Diagnosis not present

## 2023-01-12 DIAGNOSIS — I340 Nonrheumatic mitral (valve) insufficiency: Secondary | ICD-10-CM | POA: Diagnosis not present

## 2023-01-12 DIAGNOSIS — E785 Hyperlipidemia, unspecified: Secondary | ICD-10-CM | POA: Diagnosis not present

## 2023-01-12 DIAGNOSIS — I495 Sick sinus syndrome: Secondary | ICD-10-CM | POA: Diagnosis not present

## 2023-01-12 MED ORDER — LOSARTAN POTASSIUM 25 MG PO TABS: 25.0000 mg | ORAL_TABLET | Freq: Two times a day (BID) | ORAL | 3 refills | Status: DC

## 2023-01-12 NOTE — Telephone Encounter (Signed)
Error

## 2023-01-12 NOTE — Patient Instructions (Signed)
Medication Instructions:   INCREASE YOUR LOSARTAN TO 25 MG BY MOUTH TWICE DAILY  *If you need a refill on your cardiac medications before your next appointment, please call your pharmacy*   Lab Work:  [place] OFFICE--CHECK BMET  If you have labs (blood work) drawn today and your tests are completely normal, you will receive your results only by: [place] (if you have MyChart) OR A paper copy in the mail If you have any lab test that is abnormal or we need to change your treatment, we will call you to review the results.   Testing/Procedures:  Your physician has requested that you have an echocardiogram. Echocardiography is a painless test that uses sound waves to create images of your heart. It provides your doctor with information about the size and shape of your heart and how well your heart's chambers and valves are working. This procedure takes approximately one hour. There are no restrictions for this procedure.  SCHEDULE ECHO TO BE DONE IN OCTOBER 2024 PER DR. Johney Frame   Please do NOT wear cologne, perfume, aftershave, or lotions (deodorant is allowed). Please arrive 15 minutes prior to your appointment time.    Follow-Up:  IN OCTOBER WITH DR. Dava Najjar AFTER ECHO PER DR. Johney Frame

## 2023-01-12 NOTE — Progress Notes (Signed)
Remote pacemaker transmission.   

## 2023-01-15 ENCOUNTER — Encounter: Payer: Self-pay | Admitting: Internal Medicine

## 2023-01-17 ENCOUNTER — Ambulatory Visit: Payer: Medicare Other | Admitting: Neurology

## 2023-01-18 ENCOUNTER — Ambulatory Visit: Payer: Medicare Other | Attending: Internal Medicine | Admitting: Internal Medicine

## 2023-01-18 ENCOUNTER — Ambulatory Visit: Payer: Medicare Other

## 2023-01-18 ENCOUNTER — Encounter: Payer: Self-pay | Admitting: Internal Medicine

## 2023-01-18 VITALS — BP 146/88 | HR 66 | Ht 67.0 in | Wt 169.0 lb

## 2023-01-18 DIAGNOSIS — Z79899 Other long term (current) drug therapy: Secondary | ICD-10-CM

## 2023-01-18 DIAGNOSIS — E785 Hyperlipidemia, unspecified: Secondary | ICD-10-CM

## 2023-01-18 DIAGNOSIS — I495 Sick sinus syndrome: Secondary | ICD-10-CM

## 2023-01-18 DIAGNOSIS — I10 Essential (primary) hypertension: Secondary | ICD-10-CM | POA: Diagnosis not present

## 2023-01-18 DIAGNOSIS — I480 Paroxysmal atrial fibrillation: Secondary | ICD-10-CM | POA: Diagnosis not present

## 2023-01-18 DIAGNOSIS — R55 Syncope and collapse: Secondary | ICD-10-CM | POA: Diagnosis not present

## 2023-01-18 DIAGNOSIS — Z9889 Other specified postprocedural states: Secondary | ICD-10-CM

## 2023-01-18 DIAGNOSIS — Z950 Presence of cardiac pacemaker: Secondary | ICD-10-CM

## 2023-01-18 DIAGNOSIS — I340 Nonrheumatic mitral (valve) insufficiency: Secondary | ICD-10-CM | POA: Diagnosis not present

## 2023-01-18 DIAGNOSIS — I471 Supraventricular tachycardia, unspecified: Secondary | ICD-10-CM | POA: Diagnosis not present

## 2023-01-18 NOTE — Progress Notes (Signed)
HPI  Ms. Kelp returns today for followup. She is a pleasant 67 yo woman with a h/o unexplained syncope as well as prior mitral valve repair/MAZE and autonomic dysfunction. She was found to have sinus node dysfunction and has undergone PPM. She notes that since her PM was placed, she is able to go up stairs and hills better with less difficulty. No additional syncope.  Current Outpatient Medications  Medication Sig Dispense Refill   amoxicillin (AMOXIL) 500 MG capsule TAKE 4 CAPSULES BY MOUTH 1 HOUR BEFORE DENTAL APPOINTMENT 4 capsule 3   aspirin 81 MG tablet Take 1 tablet (81 mg total) by mouth daily.     atorvastatin (LIPITOR) 20 MG tablet TAKE 1 TABLET BY MOUTH DAILY 90 tablet 1   BIOTIN PO Place 1 patch onto the skin daily.     Glucosamine Sulfate-MSM (MSM-GLUCOSAMINE EX) Place 1 patch onto the skin daily.     losartan (COZAAR) 25 MG tablet Take 1 tablet (25 mg total) by mouth 2 (two) times daily. 180 tablet 3   metoprolol succinate (TOPROL XL) 25 MG 24 hr tablet Take 1 tablet (25 mg total) by mouth 2 (two) times daily. 180 tablet 3   Omega-3 Fatty Acids (FISH OIL PO) Take 2 capsules by mouth daily.     polyethylene glycol (MIRALAX / GLYCOLAX) packet Take 17 g by mouth daily.     Propylene Glycol, PF, (SYSTANE COMPLETE PF) 0.6 % SOLN Place 1 drop into both eyes as needed (dry eyes).     TURMERIC PO Take 1 tablet by mouth daily.      zolpidem (AMBIEN) 5 MG tablet TAKE 1/2 TABLET(2.5 MG) BY MOUTH AT BEDTIME AS NEEDED FOR SLEEP 45 tablet 1   No Known Allergies   Current Outpatient Medications  Medication Sig Dispense Refill   amoxicillin (AMOXIL) 500 MG capsule TAKE 4 CAPSULES BY MOUTH 1 HOUR BEFORE DENTAL APPOINTMENT 4 capsule 3   aspirin 81 MG tablet Take 1 tablet (81 mg total) by mouth daily.     atorvastatin (LIPITOR) 20 MG tablet TAKE 1 TABLET BY MOUTH DAILY 90 tablet 1   BIOTIN PO Place 1 patch onto the skin daily.     Glucosamine Sulfate-MSM (MSM-GLUCOSAMINE EX) Place  1 patch onto the skin daily.     losartan (COZAAR) 25 MG tablet Take 1 tablet (25 mg total) by mouth 2 (two) times daily. 180 tablet 3   metoprolol succinate (TOPROL XL) 25 MG 24 hr tablet Take 1 tablet (25 mg total) by mouth 2 (two) times daily. 180 tablet 3   Omega-3 Fatty Acids (FISH OIL PO) Take 2 capsules by mouth daily.     polyethylene glycol (MIRALAX / GLYCOLAX) packet Take 17 g by mouth daily.     Propylene Glycol, PF, (SYSTANE COMPLETE PF) 0.6 % SOLN Place 1 drop into both eyes as needed (dry eyes).     TURMERIC PO Take 1 tablet by mouth daily.      zolpidem (AMBIEN) 5 MG tablet TAKE 1/2 TABLET(2.5 MG) BY MOUTH AT BEDTIME AS NEEDED FOR SLEEP 45 tablet 1   No current facility-administered medications for this visit.     Past Medical History:  Diagnosis Date   Arthritis    Benign essential tremor    CHF (congestive heart failure) (HCC)    Heart murmur    High cholesterol    Hypertension    Insomnia    Mitral regurgitation    Mitral valve prolapse  Osteopenia    Paroxysmal atrial fibrillation (HCC) 03/11/2013   Paroxysmal supraventricular tachycardia    PONV (postoperative nausea and vomiting)    Dizziness and Nausea   S/P Maze operation for atrial fibrillation 03/18/2013   Complete bilateral atrial lesion set using cryothermy and biopolar radiofrequency ablation with oversewing of LA appendage via right mini thoracotomy   S/P mitral valve repair 03/18/2013   Complex valvuloplasty including triangular resection of posterior leaflet, artificial Gore-tex neocord placement x4 and 60m Sorin Memo 3D ring annuloplasty via right mini thoracotomy   Severe mitral regurgitation 03/03/2013   Acute on chronic with acute heart failure    Shortness of breath    Wears glasses     ROS:   All systems reviewed and negative except as noted in the HPI.   Past Surgical History:  Procedure Laterality Date   ABDOMINAL HYSTERECTOMY     hernia repair   BIOPSY  08/26/2019   Procedure:  BIOPSY;  Surgeon: SWilford Corner MD;  Location: [facility];  Service: Endoscopy;;   CARDIAC CATHETERIZATION     COLONOSCOPY     COLONOSCOPY WITH PROPOFOL N/A 08/26/2019   Procedure: COLONOSCOPY WITH PROPOFOL;  Surgeon: SWilford Corner MD;  Location: [facility];  Service: Endoscopy;  Laterality: N/A;   INTRAOPERATIVE TRANSESOPHAGEAL ECHOCARDIOGRAM N/A 03/18/2013   Procedure: INTRAOPERATIVE TRANSESOPHAGEAL ECHOCARDIOGRAM;  Surgeon: CRexene Alberts MD;  [place] [facility]  Service: Open Heart Surgery;  Laterality: N/A;   LEFT AND RIGHT HEART CATHETERIZATION WITH CORONARY ANGIOGRAM N/A 03/06/2013   Procedure: LEFT AND RIGHT HEART CATHETERIZATION WITH CORONARY ANGIOGRAM;  Surgeon: HSinclair Grooms MD;  [facility];  Service: Cardiovascular;  Laterality: N/A;   LOOP RECORDER REMOVAL N/A 09/20/2022   Procedure: LOOP RECORDER REMOVAL;  Surgeon: TEvans Lance MD;  [facility];  Service: Cardiovascular;  Laterality: N/A;   MAZE N/A 03/18/2013   Procedure: MAZE;  Surgeon: CRexene Alberts MD;  [facility]  Service: Open Heart Surgery;  Laterality: N/A;   MITRAL VALVE REPAIR Right 03/18/2013   Procedure: MINIMALLY INVASIVE MITRAL VALVE REPAIR (MVR);  Surgeon: CRexene Alberts MD;  [place] [facility]  Service: Open Heart Surgery;  Laterality: Right;   PACEMAKER IMPLANT N/A 09/20/2022   Procedure: PACEMAKER IMPLANT;  Surgeon: TEvans Lance MD;  [facility];  Service: Cardiovascular;  Laterality: N/A;   VARICOSE VEIN SURGERY     WISDOM TOOTH EXTRACTION       Family History  Problem Relation Age of Onset   Pancreatic cancer Mother    Stomach cancer Father    Hypertension Father    Thyroid cancer Sister    Colon cancer Paternal Grandmother    Stomach cancer Paternal Grandfather    Seizures Child    Down syndrome Child      Social History   Socioeconomic History   Marital status: Married    Spouse name: LFritz Pickerel  Number of children: 3    Years of education: BS   Highest education level: Not on file  Occupational History    Employer: [employer]  Tobacco Use   Smoking status: Never   Smokeless tobacco: Never  Vaping Use   Vaping Use: Never used  Substance and Sexual Activity   Alcohol use: Yes    Alcohol/week: 5.0 - 6.0 standard drinks of alcohol    Types: 5 - 6 Glasses of wine per week    Comment: 1 drink/day   Drug use:  No   Sexual activity: Yes    Birth control/protection: Surgical  Other Topics Concern   Not on file  Social History Narrative   Patient lives at home with her husband  Fritz Pickerel)  - second husband. Patient has college education B.S. 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   Caffeine- one cup daily.   Social Determinants of Health   Financial Resource Strain: Low Risk  (10/30/2022)   Overall Financial Resource Strain (CARDIA)    Difficulty of Paying Living Expenses: Not hard at all  Food Insecurity: No Food Insecurity (10/30/2022)   Hunger Vital Sign    Worried About Running Out of Food in the Last Year: Never true    Ran Out of Food in the Last Year: Never true  Transportation Needs: No Transportation Needs (10/30/2022)   PRAPARE - Hydrologist (Medical): No    Lack of Transportation (Non-Medical): No  Physical Activity: Sufficiently Active (10/30/2022)   Exercise Vital Sign    Days of Exercise per Week: 6 days    Minutes of Exercise per Session: 60 min  Stress: No Stress Concern Present (10/30/2022)   [place]    Feeling of Stress : Not at all  Social Connections: Moderately Integrated (10/30/2022)   Social Connection and Isolation Panel [NHANES]    Frequency of Communication with Friends and Family: Three times a week    Frequency of Social Gatherings with Friends and Family: Three times a week    Attends Religious Services: Never    Active Member of Clubs  or Organizations: Yes    Attends Archivist Meetings: More than 4 times per year    Marital Status: Married  Human resources officer Violence: Not At Risk (10/30/2022)   Humiliation, Afraid, Rape, and Kick questionnaire    Fear of Current or Ex-Partner: No    Emotionally Abused: No    Physically Abused: No    Sexually Abused: No     BP (!) 146/88   Pulse 66   Ht '5\' 7"'$  (1.702 m)   Wt 169 lb (76.7 kg)   SpO2 96%   BMI 26.47 kg/m   Physical Exam:  Well appearing NAD HEENT: Unremarkable Neck:  No JVD, no thyromegally Lymphatics:  No adenopathy Back:  No CVA tenderness Lungs:  Clear with no wheezes HEART:  Regular rate rhythm, no murmurs, no rubs, no clicks Abd:  soft, positive bowel sounds, no organomegally, no rebound, no guarding Ext:  2 plus pulses, no edema, no cyanosis, no clubbing Skin:  No rashes no nodules Neuro:  CN II through XII intact, motor grossly intact  EKG - nsr with atrial pacing  DEVICE  Normal device function.  See PaceArt for details.   Assess/Plan: 1. Unexplained syncope - she has undergone insertion of a Medtronic ILR. She had syncope with a 4 second daytime pause. She is s/p DDD PM insertion and has not had more syncope. 2. PAF - we will be able to monitor this as she is not on systemic anti-coagulation. She has not had any known arrhythmias since her MAZE.  3. SVT - she has not had any SVT since her PPM insertion. 4. Arm swelling - her left arm is slightly larger than her right but is asymptomatic. She will undergo watchful waiting. Carleene Overlie Laqueena Hinchey,MD

## 2023-01-18 NOTE — Patient Instructions (Signed)
Medication Instructions:  Your physician recommends that you continue on your current medications as directed. Please refer to the Current Medication list given to you today.  *If you need a refill on your cardiac medications before your next appointment, please call your pharmacy*  Lab Work: None ordered.  If you have labs (blood work) drawn today and your tests are completely normal, you will receive your results only by: [place] (if you have MyChart) OR A paper copy in the mail If you have any lab test that is abnormal or we need to change your treatment, we will call you to review the results.  Testing/Procedures: None ordered.  Follow-Up: At [facility], you and your health needs are our priority.  As part of our continuing mission to provide you with exceptional heart care, we have created designated Provider Care Teams.  These Care Teams include your primary Cardiologist (physician) and Advanced Practice Providers (APPs -  Physician Assistants and Nurse Practitioners) who all work together to provide you with the care you need, when you need it.  We recommend signing up for the patient portal called "MyChart".  Sign up information is provided on this After Visit Summary.  MyChart is used to connect with patients for Virtual Visits (Telemedicine).  Patients are able to view lab/test results, encounter notes, upcoming appointments, etc.  Non-urgent messages can be sent to your provider as well.   To learn more about what you can do with MyChart, go to [email or web address].    Your next appointment:   1 year(s)  The format for your next appointment:   In Person  Provider:   Cristopher Peru, MD{or one of the following Advanced Practice Providers on your designated Care Team:   Tommye Standard, [place] Legrand Como "Jonni Sanger" Chalmers Cater, [place]  Remote monitoring is used to monitor your Pacemaker from home. This monitoring reduces the number of office visits required to check your device to  one time per year. It allows [place] to keep an eye on the functioning of your device to ensure it is working properly. You are scheduled for a device check from home. You may send your transmission at any time that day. If you have a wireless device, the transmission will be sent automatically. After your physician reviews your transmission, you will receive a postcard with your next transmission date.

## 2023-01-19 ENCOUNTER — Other Ambulatory Visit: Payer: Medicare Other

## 2023-01-19 LAB — BASIC METABOLIC PANEL
BUN/Creatinine Ratio: 19 (ref 12–28)
BUN: 16 mg/dL (ref 8–27)
CO2: 24 mmol/L (ref 20–29)
Calcium: 9.5 mg/dL (ref 8.7–10.3)
Chloride: 103 mmol/L (ref 96–106)
Creatinine, Ser: 0.83 mg/dL (ref 0.57–1.00)
Glucose: 114 mg/dL — ABNORMAL HIGH (ref 70–99)
Potassium: 4.7 mmol/L (ref 3.5–5.2)
Sodium: 141 mmol/L (ref 134–144)
eGFR: 76 mL/min/{1.73_m2} (ref >59)

## 2023-01-24 ENCOUNTER — Encounter: Payer: Self-pay | Admitting: Internal Medicine

## 2023-01-24 ENCOUNTER — Other Ambulatory Visit: Payer: Self-pay

## 2023-01-24 DIAGNOSIS — Z1231 Encounter for screening mammogram for malignant neoplasm of breast: Secondary | ICD-10-CM

## 2023-01-25 ENCOUNTER — Other Ambulatory Visit: Payer: Self-pay

## 2023-01-25 ENCOUNTER — Ambulatory Visit: Payer: Medicare Other | Attending: Primary Care | Admitting: Primary Care

## 2023-01-25 VITALS — BP 128/82 | HR 74 | Temp 96.4°F | Wt 103.0 lb

## 2023-01-25 DIAGNOSIS — K219 Gastro-esophageal reflux disease without esophagitis: Secondary | ICD-10-CM | POA: Insufficient documentation

## 2023-01-25 DIAGNOSIS — E559 Vitamin D deficiency, unspecified: Secondary | ICD-10-CM

## 2023-01-25 DIAGNOSIS — E034 Atrophy of thyroid (acquired): Secondary | ICD-10-CM

## 2023-01-25 DIAGNOSIS — M79671 Pain in right foot: Secondary | ICD-10-CM | POA: Insufficient documentation

## 2023-01-25 DIAGNOSIS — E7800 Pure hypercholesterolemia, unspecified: Secondary | ICD-10-CM | POA: Insufficient documentation

## 2023-01-25 DIAGNOSIS — G40909 Epilepsy, unspecified, not intractable, without status epilepticus: Secondary | ICD-10-CM

## 2023-01-25 MED ORDER — GENERIC DME *A*
0 refills | Status: AC
Start: 2023-01-25 — End: ?

## 2023-01-25 NOTE — Progress Notes (Signed)
Reason for Visit: Follow-up (3 months/)    Osteoporosis--had her labs done in January for the prolia, and these were wnl  Remains on same regimen as below  Would like to try to gain some weight, but finds it difficult with the GERD and IBS as her diet is limited.  We discussed seeing a dietitian for this  They continue to follow strict COVID precautions d/t Frank's immune compromise and risk factors , we discussed having friends out on their screened in porch and having friends over masked   Patient Active Problem List   Diagnosis Code    Reported Trauma Neck     Allergic rhinitis J30.9    Irritable bowel syndrome K58.9    Essential hypertension I10    Dysplastic Nevus I78.1    Rosacea L71.9    Osteoporosis M81.0    Hypothyroidism E03.9    Scoliosis M41.20    Urge Incontinence Of Urine N39.41    A-V Malformation Repair Supratentorial Complex     Seizure disorder G40.909    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    Ringing In The Ears (Tinnitus) H93.19    GERD (gastroesophageal reflux disease) K21.9    Pain in joint, ankle and foot M25.579    Weakness of foot R29.898    Hyperlipidemia E78.5     Current Outpatient Medications   Medication    generic DME    amLODIPine (NORVASC) 5 mg tablet    carBAMazepine (TEGRETOL XR) 200 mg 12 hr tablet    azelastine (ASTELIN) 0.1 % nasal spray    ketoconazole (NIZORAL) 2 % cream    solifenacin (VESICARE) 10 mg tablet    guaiFENesin (MUCINEX) 600 MG 12 hr tablet    levothyroxine (SYNTHROID, LEVOTHROID) 25 mcg tablet    carBAMazepine (CARBATROL) 100 mg 12 hr capsule    fluticasone (FLOVENT DISKUS) 250 MCG/ACT diskus inhaler    RABEprazole (ACIPHEX) 20 MG tablet    atorvastatin (LIPITOR) 10 mg tablet    generic DME    metroNIDAZOLE (METROCREAM) 0.75 % cream    econazole nitrate (SPECTAZOLE) 1 % cream    famotidine (PEPCID) 20 mg tablet    pseudoephedrine (SUDAFED) 30 mg tablet    [facility] 12 HOUR NASAL DECONGESTANT 120 MG 12 hr tablet    triamcinolone (KENALOG) 0.1 % ointment     beclomethasone (QNASL) 80 MCG/ACT nasal spray    Non-System Medication    Calcium Carb-Cholecalciferol 600-200 MG-UNIT TABS    Denosumab (PROLIA SC)    loratadine (CLARITIN) 10 mg tablet    Multiple Vitamin (MULTIVITAMIN) per tablet    cholecalciferol (VITAMIN D) 1000 UNITS capsule     No current facility-administered medications for this visit.       Medication list reviewed and updated, no changes were made today    Exam: BP 128/82 (BP Location: Left arm, Patient Position: Sitting, Cuff Size: adult)   Pulse 74   Temp 35.8 C (96.4 F)   Wt 46.7 kg (103 lb)   SpO2 98%   BMI 18.25 kg/m   67 yo female in nad. Neck is supple without lad, no supraclavicular adenopathy. Lungs are CTA bilaterally, resp easy. Breast exam is without mass or axillary adenopathy  Abdomen is soft, nontender. Extremities are without edema    A/P:  Low BMI--she is interested in gaining weight and has to be mindful of food choices given her underlying reflux and IBS.  Will refer to Sue Lush at [facility] for dietary counseling   Htn--well  controlled on amlodipine  Hyperlipidemia--will check lipid panel and adjust atorvastatin accordingly  Osteoporosis--continue calcium, vitamin d, prolia   Orders Placed This Encounter   Procedures    Vitamin D    TSH    CBC    Comprehensive metabolic panel    Lipid Panel (Reflex to Direct  LDL if Triglycerides more than 400)    Carbamazepine, free & total    AMB REFERRAL TO [facility]     40 min personally spent on the date of the encounter including face to face pre and post visit work

## 2023-01-26 ENCOUNTER — Other Ambulatory Visit: Payer: Self-pay | Admitting: Gastroenterology

## 2023-01-29 ENCOUNTER — Encounter: Payer: Self-pay | Admitting: Gastroenterology

## 2023-01-30 ENCOUNTER — Encounter: Payer: Self-pay | Admitting: Primary Care

## 2023-02-05 ENCOUNTER — Other Ambulatory Visit: Payer: Self-pay | Admitting: Primary Care

## 2023-02-05 DIAGNOSIS — E785 Hyperlipidemia, unspecified: Secondary | ICD-10-CM

## 2023-02-14 ENCOUNTER — Encounter: Payer: Self-pay | Admitting: Primary Care

## 2023-02-14 ENCOUNTER — Other Ambulatory Visit: Payer: Self-pay | Admitting: Primary Care

## 2023-02-14 ENCOUNTER — Other Ambulatory Visit
Admission: RE | Admit: 2023-02-14 | Discharge: 2023-02-14 | Disposition: A | Discharge status: Home / Self Care | Payer: Medicare Other | Source: Ambulatory Visit | Attending: Primary Care | Admitting: Primary Care

## 2023-02-14 DIAGNOSIS — G40909 Epilepsy, unspecified, not intractable, without status epilepticus: Secondary | ICD-10-CM | POA: Insufficient documentation

## 2023-02-14 DIAGNOSIS — K219 Gastro-esophageal reflux disease without esophagitis: Secondary | ICD-10-CM | POA: Insufficient documentation

## 2023-02-14 DIAGNOSIS — E559 Vitamin D deficiency, unspecified: Secondary | ICD-10-CM | POA: Insufficient documentation

## 2023-02-14 DIAGNOSIS — I10 Essential (primary) hypertension: Secondary | ICD-10-CM | POA: Insufficient documentation

## 2023-02-14 DIAGNOSIS — E034 Atrophy of thyroid (acquired): Secondary | ICD-10-CM | POA: Insufficient documentation

## 2023-02-14 DIAGNOSIS — E7800 Pure hypercholesterolemia, unspecified: Secondary | ICD-10-CM | POA: Insufficient documentation

## 2023-02-14 DIAGNOSIS — D696 Thrombocytopenia, unspecified: Secondary | ICD-10-CM

## 2023-02-14 LAB — LIPID PANEL
Chol/HDL Ratio: 2
Cholesterol: 219 mg/dL — AB
HDL: 109 mg/dL — ABNORMAL HIGH (ref 40–60)
LDL Calculated: 93 mg/dL
Non HDL Cholesterol: 110 mg/dL
Triglycerides: 83 mg/dL

## 2023-02-14 LAB — COMPREHENSIVE METABOLIC PANEL
ALT: 35 U/L (ref 0–35)
Albumin: 4.8 g/dL (ref 3.5–5.2)
Alk Phos: 61 U/L (ref 35–105)
Anion Gap: 12 (ref 7–16)
Bilirubin,Total: 0.3 mg/dL (ref 0.0–1.2)
CO2: 26 mmol/L (ref 20–28)
Calcium: 9.9 mg/dL (ref 8.6–10.2)
Chloride: 100 mmol/L (ref 96–108)
Creatinine: 0.55 mg/dL (ref 0.51–0.95)
Glucose: 95 mg/dL (ref 60–99)
Lab: 20 mg/dL (ref 6–20)
Potassium: 5 mmol/L (ref 3.3–5.1)
Sodium: 138 mmol/L (ref 133–145)
Total Protein: 7.1 g/dL (ref 6.3–7.7)
eGFR BY CREAT: 99 *

## 2023-02-14 LAB — TSH: TSH: 2 u[IU]/mL (ref 0.27–4.20)

## 2023-02-14 LAB — CBC
Hematocrit: 41 % (ref 34–49)
Hemoglobin: 13 g/dL (ref 11.2–16.0)
MCV: 102 fL — ABNORMAL HIGH (ref 75–100)
Platelets: 129 10*3/uL — ABNORMAL LOW (ref 150–450)
RBC: 4 MIL/uL (ref 4.0–5.5)
RDW: 13 % (ref 0.0–15.0)
WBC: 4.4 10*3/uL (ref 3.5–11.0)

## 2023-02-14 LAB — RESOLUTION

## 2023-02-14 LAB — VITAMIN D: 25-OH Vit Total: 48 ng/mL (ref 30–60)

## 2023-02-16 LAB — CARBAMAZEPINE, FREE & TOTAL
Carbamazepine, Free: 21.8 % (ref 8.0–35.0)
Carbamazepine,Free: 2.2 ug/mL (ref 1.0–3.0)
Carbamazepine,Total: 10.1 ug/mL (ref 4.0–12.0)

## 2023-03-20 ENCOUNTER — Other Ambulatory Visit: Payer: Self-pay | Admitting: Student in an Organized Health Care Education/Training Program

## 2023-03-21 ENCOUNTER — Ambulatory Visit: Payer: Medicare Other | Attending: Primary Care | Admitting: Primary Care

## 2023-03-21 ENCOUNTER — Encounter: Payer: Self-pay | Admitting: Primary Care

## 2023-03-21 ENCOUNTER — Other Ambulatory Visit: Payer: Self-pay

## 2023-03-21 ENCOUNTER — Ambulatory Visit (INDEPENDENT_AMBULATORY_CARE_PROVIDER_SITE_OTHER): Payer: Medicare Other

## 2023-03-21 VITALS — BP 132/60 | HR 80 | Temp 97.9°F | Ht 62.0 in | Wt 107.4 lb

## 2023-03-21 DIAGNOSIS — I495 Sick sinus syndrome: Secondary | ICD-10-CM | POA: Diagnosis not present

## 2023-03-21 DIAGNOSIS — R0689 Other abnormalities of breathing: Secondary | ICD-10-CM | POA: Insufficient documentation

## 2023-03-21 DIAGNOSIS — J019 Acute sinusitis, unspecified: Secondary | ICD-10-CM | POA: Insufficient documentation

## 2023-03-21 DIAGNOSIS — B9689 Other specified bacterial agents as the cause of diseases classified elsewhere: Secondary | ICD-10-CM | POA: Insufficient documentation

## 2023-03-21 MED ORDER — DOXYCYCLINE HYCLATE 100 MG PO TABS *I*
100.0000 mg | ORAL_TABLET | Freq: Two times a day (BID) | ORAL | 0 refills | Status: DC
Start: 2023-03-21 — End: 2023-07-18

## 2023-03-21 NOTE — Progress Notes (Signed)
Reason for Visit: Other (congestion)    Pt reports sx including increased phlegm, post nasal drip, headache and sore throat  Covid negative  She also endorses fatigue and fullness in the ears  Denies f/c    Patient Active Problem List   Diagnosis Code    Reported Trauma Neck     Allergic rhinitis J30.9    Irritable bowel syndrome K58.9    Essential hypertension I10    Dysplastic Nevus I78.1    Rosacea L71.9    Osteoporosis M81.0    Hypothyroidism E03.9    Scoliosis M41.20    Urge Incontinence Of Urine N39.41    A-V Malformation Repair Supratentorial Complex     Seizure disorder G40.909    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    Ringing In The Ears (Tinnitus) H93.19    GERD (gastroesophageal reflux disease) K21.9    Pain in joint, ankle and foot M25.579    Weakness of foot R29.898    Hyperlipidemia E78.5     Current Outpatient Medications   Medication    carBAMazepine (CARBATROL) 100 mg 12 hr capsule    atorvastatin (LIPITOR) 10 mg tablet    generic DME    amLODIPine (NORVASC) 5 mg tablet    carBAMazepine (TEGRETOL XR) 200 mg 12 hr tablet    azelastine (ASTELIN) 0.1 % nasal spray    ketoconazole (NIZORAL) 2 % cream    solifenacin (VESICARE) 10 mg tablet    guaiFENesin (MUCINEX) 600 MG 12 hr tablet    levothyroxine (SYNTHROID, LEVOTHROID) 25 mcg tablet    fluticasone (FLOVENT DISKUS) 250 MCG/ACT diskus inhaler    RABEprazole (ACIPHEX) 20 MG tablet    generic DME    metroNIDAZOLE (METROCREAM) 0.75 % cream    econazole nitrate (SPECTAZOLE) 1 % cream    famotidine (PEPCID) 20 mg tablet    pseudoephedrine (SUDAFED) 30 mg tablet    [facility] 12 HOUR NASAL DECONGESTANT 120 MG 12 hr tablet    triamcinolone (KENALOG) 0.1 % ointment    beclomethasone (QNASL) 80 MCG/ACT nasal spray    Non-System Medication    Calcium Carb-Cholecalciferol 600-200 MG-UNIT TABS    Denosumab (PROLIA SC)    loratadine (CLARITIN) 10 mg tablet    Multiple Vitamin (MULTIVITAMIN) per tablet    cholecalciferol (VITAMIN D) 1000 UNITS capsule     No  current facility-administered medications for this visit.       Medication list reviewed and updated, no changes were made today    Exam: BP 132/60   Pulse 80   Temp 36.6 C (97.9 F)   Ht 1.575 m (5\' 2" )   Wt 48.7 kg (107 lb 6.4 oz)   SpO2 99%   BMI 19.64 kg/m   Non toxic appearing 67 yo. Nasal mucosa is moist, erythematous, with discolored drainage. Oral mucosa moist without exudate, posterior pharyngeal erythema is present.  Neck is supple without lad, no supraclavicular adenopathy. Lungs reveal a slight low pitched inspiratory hum in the left upper lobe.  No frank wheezes, rales or rhonchi appreciated    A/P:  Sinusitis--given abnormal breath sounds and in light of the fact that her husband currently has an atypical pneumonia, I recommended a chest xray to rule out infiltrate. Reassuringly, this was negative.  I did recommend treatment with doxycycline 100mg  twice daily for ten days

## 2023-03-22 ENCOUNTER — Ambulatory Visit
Admission: RE | Admit: 2023-03-22 | Discharge: 2023-03-22 | Disposition: A | Discharge status: Home / Self Care | Payer: Medicare Other | Source: Ambulatory Visit

## 2023-03-22 DIAGNOSIS — R0689 Other abnormalities of breathing: Secondary | ICD-10-CM

## 2023-03-22 DIAGNOSIS — R059 Cough, unspecified: Secondary | ICD-10-CM

## 2023-03-22 LAB — CUP PACEART REMOTE DEVICE CHECK
Battery Remaining Longevity: 165 mo
Battery Voltage: 3.17 V
Brady Statistic AP VP Percent: 0.01 %
Brady Statistic AP VS Percent: 68.71 %
Brady Statistic AS VP Percent: 0.03 %
Brady Statistic AS VS Percent: 31.25 %
Brady Statistic RA Percent Paced: 68.86 %
Brady Statistic RV Percent Paced: 0.04 %
Date Time Interrogation Session: 20240429194858
Implantable Lead Connection Status: 753985
Implantable Lead Connection Status: 753985
Implantable Lead Implant Date: 20231101
Implantable Lead Implant Date: 20231101
Implantable Lead Location: 753859
Implantable Lead Location: 753860
Implantable Lead Model: 3830
Implantable Lead Model: 5076
Implantable Pulse Generator Implant Date: 20231101
Lead Channel Impedance Value: 323 Ohm
Lead Channel Impedance Value: 361 Ohm
Lead Channel Impedance Value: 551 Ohm
Lead Channel Impedance Value: 570 Ohm
Lead Channel Pacing Threshold Amplitude: 0.875 V
Lead Channel Pacing Threshold Amplitude: 0.875 V
Lead Channel Pacing Threshold Pulse Width: 0.4 ms
Lead Channel Pacing Threshold Pulse Width: 0.4 ms
Lead Channel Sensing Intrinsic Amplitude: 16.625 mV
Lead Channel Sensing Intrinsic Amplitude: 16.625 mV
Lead Channel Sensing Intrinsic Amplitude: 2.25 mV
Lead Channel Sensing Intrinsic Amplitude: 2.25 mV
Lead Channel Setting Pacing Amplitude: 1.5 V
Lead Channel Setting Pacing Amplitude: 2 V
Lead Channel Setting Pacing Pulse Width: 0.4 ms
Lead Channel Setting Sensing Sensitivity: 1.2 mV
Zone Setting Status: 755011

## 2023-03-23 ENCOUNTER — Encounter: Payer: Self-pay | Admitting: Primary Care

## 2023-03-23 DIAGNOSIS — Z1231 Encounter for screening mammogram for malignant neoplasm of breast: Secondary | ICD-10-CM | POA: Diagnosis not present

## 2023-03-23 DIAGNOSIS — M8588 Other specified disorders of bone density and structure, other site: Secondary | ICD-10-CM | POA: Diagnosis not present

## 2023-03-23 LAB — HM MAMMOGRAPHY

## 2023-03-23 NOTE — Addendum Note (Signed)
Addended by: Deirdre Priest on: 03/23/2023 02:45 PM     Modules accepted: Level of Service

## 2023-03-26 ENCOUNTER — Other Ambulatory Visit: Payer: Self-pay

## 2023-03-26 ENCOUNTER — Ambulatory Visit
Admission: RE | Admit: 2023-03-26 | Discharge: 2023-03-26 | Disposition: A | Discharge status: Home / Self Care | Payer: Medicare Other | Source: Ambulatory Visit

## 2023-03-26 ENCOUNTER — Encounter: Payer: Self-pay | Admitting: Radiology

## 2023-03-26 DIAGNOSIS — R0689 Other abnormalities of breathing: Secondary | ICD-10-CM

## 2023-03-26 DIAGNOSIS — R918 Other nonspecific abnormal finding of lung field: Secondary | ICD-10-CM

## 2023-03-29 ENCOUNTER — Ambulatory Visit: Payer: Medicare Other | Admitting: Optometry

## 2023-04-02 ENCOUNTER — Encounter: Payer: Self-pay | Admitting: Internal Medicine

## 2023-04-02 ENCOUNTER — Telehealth: Payer: Self-pay

## 2023-04-02 NOTE — Telephone Encounter (Signed)
Pt left a voicemail for [place] to call to let her know if the remote has been reviewed and how she can see the comments in mychart.  I left a voicemail returning the call for the patient. Left the device clinic number for her to call back.

## 2023-04-02 NOTE — Telephone Encounter (Signed)
I let the patient know that everything is normal. No changes need to be made. I gave the patient the number to IT department to get help with my chart.

## 2023-04-05 ENCOUNTER — Other Ambulatory Visit: Payer: Self-pay | Admitting: Primary Care

## 2023-04-05 NOTE — Telephone Encounter (Signed)
Last Telemed:    Visit date not found     Last office visit:     03/21/2023    Last PA office visit: Visit date not found    Patients upcoming appointments:   Future Appointments   Date Time Provider Department Center   04/23/2023  8:30 AM Deirdre Priest, MD [facility] None   05/21/2023  8:45 AM Jamelle Rushing, OD [facility] None   05/31/2023  9:00 AM Benesch, Joeseph Amor, MD [facility] None   10/25/2023  8:00 AM Deirdre Priest, MD [facility] None       Opioid Drug Screen:  No results for input(s): "AMPU", "BEU", "OPSU", "OXYU", "THCU", "BZDU", "COPS", "CTHC", "UCRNC" in the last 8760 hours.    Last dispensed if controlled:

## 2023-04-10 ENCOUNTER — Other Ambulatory Visit: Payer: Self-pay | Admitting: Internal Medicine

## 2023-04-11 NOTE — Progress Notes (Signed)
Remote pacemaker transmission.   

## 2023-04-23 ENCOUNTER — Other Ambulatory Visit: Payer: Self-pay

## 2023-04-23 ENCOUNTER — Ambulatory Visit: Payer: Medicare Other | Attending: Primary Care | Admitting: Primary Care

## 2023-04-23 ENCOUNTER — Encounter: Payer: Self-pay | Admitting: Primary Care

## 2023-04-23 VITALS — BP 110/70 | HR 60 | Ht 62.0 in | Wt 106.8 lb

## 2023-04-23 DIAGNOSIS — I10 Essential (primary) hypertension: Secondary | ICD-10-CM | POA: Insufficient documentation

## 2023-04-23 DIAGNOSIS — K219 Gastro-esophageal reflux disease without esophagitis: Secondary | ICD-10-CM | POA: Insufficient documentation

## 2023-04-23 DIAGNOSIS — E559 Vitamin D deficiency, unspecified: Secondary | ICD-10-CM | POA: Insufficient documentation

## 2023-04-23 DIAGNOSIS — E034 Atrophy of thyroid (acquired): Secondary | ICD-10-CM | POA: Insufficient documentation

## 2023-04-23 DIAGNOSIS — E7800 Pure hypercholesterolemia, unspecified: Secondary | ICD-10-CM | POA: Insufficient documentation

## 2023-04-23 DIAGNOSIS — G40909 Epilepsy, unspecified, not intractable, without status epilepticus: Secondary | ICD-10-CM | POA: Insufficient documentation

## 2023-04-23 NOTE — Progress Notes (Signed)
Reason for Visit: Follow-up (3 Month)    She is back into PT    She is going to see the nutritionist, [facility] to help with weight gain despite her dietary limitations d/t GERD and IBS     Reflux has not been great;  no difficulty swallowing, but more stomach pains  When her son is in town (he is vegetarian, almost vegan), it is a strain to be on her feet cooking so long  Usually when it is just her and Homero Fellers, they are able to make simpler meals that don't involve so much preparation    Sinuses are tolerable, for a few hours, she can barely keep her eyes open but it does eventually pass    Had blood on the TP once, just a dab  Had a hard stool that seemed to bring this on, nothing since    Has not yet had her blood work done    Patient Active Problem List   Diagnosis Code    Reported Trauma Neck     Allergic rhinitis J30.9    Irritable bowel syndrome K58.9    Essential hypertension I10    Dysplastic Nevus I78.1    Rosacea L71.9    Osteoporosis M81.0    Hypothyroidism E03.9    Scoliosis M41.20    Urge Incontinence Of Urine N39.41    A-V Malformation Repair Supratentorial Complex     Seizure disorder G40.909    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    Ringing In The Ears (Tinnitus) H93.19    GERD (gastroesophageal reflux disease) K21.9    Pain in joint, ankle and foot M25.579    Weakness of foot R29.898    Hyperlipidemia E78.5     Current Outpatient Medications   Medication    RABEprazole (ACIPHEX) 20 MG tablet    doxycycline hyclate (VIBRA-TABS) 100 mg tablet    carBAMazepine (CARBATROL) 100 mg 12 hr capsule    atorvastatin (LIPITOR) 10 mg tablet    generic DME    amLODIPine (NORVASC) 5 mg tablet    carBAMazepine (TEGRETOL XR) 200 mg 12 hr tablet    azelastine (ASTELIN) 0.1 % nasal spray    ketoconazole (NIZORAL) 2 % cream    solifenacin (VESICARE) 10 mg tablet    guaiFENesin (MUCINEX) 600 MG 12 hr tablet    levothyroxine (SYNTHROID, LEVOTHROID) 25 mcg tablet    fluticasone (FLOVENT DISKUS) 250  MCG/ACT diskus inhaler    generic DME    metroNIDAZOLE (METROCREAM) 0.75 % cream    econazole nitrate (SPECTAZOLE) 1 % cream    famotidine (PEPCID) 20 mg tablet    pseudoephedrine (SUDAFED) 30 mg tablet    [facility] 12 HOUR NASAL DECONGESTANT 120 MG 12 hr tablet    triamcinolone (KENALOG) 0.1 % ointment    beclomethasone (QNASL) 80 MCG/ACT nasal spray    Non-System Medication    Calcium Carb-Cholecalciferol 600-200 MG-UNIT TABS    Denosumab (PROLIA SC)    loratadine (CLARITIN) 10 mg tablet    Multiple Vitamin (MULTIVITAMIN) per tablet    cholecalciferol (VITAMIN D) 1000 UNITS capsule     No current facility-administered medications for this visit.       Medication list reviewed and updated, no changes were made today    Exam: BP 110/70   Pulse 60   Ht 1.575 m (5\' 2" )   Wt 48.4 kg (106 lb 12.8 oz)   BMI 19.53 kg/m   Well-nourished alert and conversant, no acute distress. Nasal mucosa boggy  without exudate.  Neck is supple without lad, lungs are CTA bilaterally, resp easy.  Cardiac exam reveals nl S1S2 RRR.  Abdomen is soft, nontender, normoactive BS appreciated.  No organomegaly noted.  Extremities reveal 2+DP and PT pulses and no edema.    A/P:  HTN--well controlled on current dose of amlodipine  Hyperlipidemia--check lipid panel and adjust atorvastatin accordingly  Allergic rhinitis--controlled with claritin, sudafed and qnasl , astelin  Relatively low BMI--will meet with [facility] to work on increasing caloric intake in a way that does not flare her GERD or IBS

## 2023-04-25 ENCOUNTER — Encounter: Payer: Self-pay | Admitting: Primary Care

## 2023-04-26 ENCOUNTER — Other Ambulatory Visit: Payer: Self-pay | Admitting: Gastroenterology

## 2023-04-26 ENCOUNTER — Other Ambulatory Visit
Admission: RE | Admit: 2023-04-26 | Discharge: 2023-04-26 | Disposition: A | Discharge status: Home / Self Care | Payer: Medicare Other | Source: Ambulatory Visit | Attending: Primary Care | Admitting: Primary Care

## 2023-04-26 DIAGNOSIS — K219 Gastro-esophageal reflux disease without esophagitis: Secondary | ICD-10-CM

## 2023-04-26 DIAGNOSIS — J029 Acute pharyngitis, unspecified: Secondary | ICD-10-CM | POA: Insufficient documentation

## 2023-04-26 DIAGNOSIS — E7800 Pure hypercholesterolemia, unspecified: Secondary | ICD-10-CM

## 2023-04-26 DIAGNOSIS — D696 Thrombocytopenia, unspecified: Secondary | ICD-10-CM | POA: Insufficient documentation

## 2023-04-26 DIAGNOSIS — E034 Atrophy of thyroid (acquired): Secondary | ICD-10-CM

## 2023-04-26 DIAGNOSIS — G40909 Epilepsy, unspecified, not intractable, without status epilepticus: Secondary | ICD-10-CM

## 2023-04-26 DIAGNOSIS — E559 Vitamin D deficiency, unspecified: Secondary | ICD-10-CM | POA: Insufficient documentation

## 2023-04-26 LAB — COMPREHENSIVE METABOLIC PANEL
ALT: 33 U/L (ref 0–35)
AST: 24 U/L (ref 0–35)
Albumin: 4.7 g/dL (ref 3.5–5.2)
Alk Phos: 59 U/L (ref 35–105)
Anion Gap: 9 (ref 7–16)
Bilirubin,Total: 0.3 mg/dL (ref 0.0–1.2)
CO2: 30 mmol/L — ABNORMAL HIGH (ref 20–28)
Calcium: 9.3 mg/dL (ref 8.6–10.2)
Chloride: 101 mmol/L (ref 96–108)
Creatinine: 0.63 mg/dL (ref 0.51–0.95)
Glucose: 87 mg/dL (ref 60–99)
Lab: 25 mg/dL — ABNORMAL HIGH (ref 6–20)
Potassium: 4.6 mmol/L (ref 3.3–5.1)
Sodium: 140 mmol/L (ref 133–145)
Total Protein: 6.9 g/dL (ref 6.3–7.7)
eGFR BY CREAT: 96 *

## 2023-04-26 LAB — LIPID PANEL
Chol/HDL Ratio: 1.8
Cholesterol: 204 mg/dL — AB
HDL: 111 mg/dL — ABNORMAL HIGH (ref 40–60)
LDL Calculated: 76 mg/dL
Non HDL Cholesterol: 93 mg/dL
Triglycerides: 86 mg/dL

## 2023-04-26 LAB — CBC AND DIFFERENTIAL
Baso # K/uL: 0 10*3/uL (ref 0.0–0.2)
Eos # K/uL: 0 10*3/uL (ref 0.0–0.5)
Hematocrit: 41 % (ref 34–49)
Hemoglobin: 13 g/dL (ref 11.2–16.0)
IMM Granulocytes #: 0 10*3/uL (ref 0.0–0.0)
IMM Granulocytes: 0.2 %
Lymph # K/uL: 1.3 10*3/uL (ref 1.0–5.0)
MCV: 103 fL — ABNORMAL HIGH (ref 75–100)
Mono # K/uL: 0.4 10*3/uL (ref 0.1–1.0)
Neut # K/uL: 2.3 10*3/uL (ref 1.5–6.5)
Nucl RBC # K/uL: 0 10*3/uL (ref 0.0–0.0)
Nucl RBC %: 0 /100 WBC (ref 0.0–0.2)
Platelets: 219 10*3/uL (ref 150–450)
RBC: 4 MIL/uL (ref 4.0–5.5)
RDW: 13 % (ref 0.0–15.0)
Seg Neut %: 57.3 %
WBC: 4 10*3/uL (ref 3.5–11.0)

## 2023-04-26 LAB — BILIRUBIN, DIRECT: Bilirubin,Direct: <0.2 mg/dL (ref 0.0–0.3)

## 2023-04-26 LAB — TSH: TSH: 1.71 u[IU]/mL (ref 0.27–4.20)

## 2023-04-26 LAB — VITAMIN D: 25-OH Vit Total: 47 ng/mL (ref 30–60)

## 2023-04-27 ENCOUNTER — Encounter: Payer: Self-pay | Admitting: Primary Care

## 2023-04-28 LAB — CARBAMAZEPINE, FREE & TOTAL
Carbamazepine, Free: 21.7 % (ref 8.0–35.0)
Carbamazepine,Free: 2.3 ug/mL (ref 1.0–3.0)
Carbamazepine,Total: 10.6 ug/mL (ref 4.0–12.0)

## 2023-05-01 MED ORDER — ECONAZOLE NITRATE 1 % EX CREA *I*
TOPICAL_CREAM | Freq: Every day | CUTANEOUS | 1 refills | Status: DC
Start: 2023-05-01 — End: 2023-05-08

## 2023-05-04 ENCOUNTER — Telehealth: Payer: Self-pay | Admitting: Primary Care

## 2023-05-04 NOTE — Telephone Encounter (Signed)
It looks like with good rx this is 23 dollars at [employer]. Pls find out if pt would rather pay out of pocket with good rx or try another formulary alternative.  She needs to try and fail two alternatives before the insurance will cover this

## 2023-05-04 NOTE — Telephone Encounter (Signed)
Pharmacy requesting Prior Auth for medication      econazole nitrate (SPECTAZOLE) 1 % cream  Apply topically daily. to the following areas: TOES/TOENAIL  Disp-30 g, R-1

## 2023-05-04 NOTE — Telephone Encounter (Signed)
Spoke with Patient and she is open to paying the $23 at [facility] for the econazole.

## 2023-05-04 NOTE — Telephone Encounter (Signed)
Deniedtoday  Denied. This drug is not covered on the formulary. We are denying your request because we do not show that you have tried at least 2 covered drugs that can treat your condition. You have already tried ketoconazole 2% cream. Other covered drug(s) is/are: clotrimazole external (cream, and solution). We may be able to make an exception to cover this drug. Your doctor will need to send [place] medical records showing that you tried this drug. If you cannot take the covered drug, your doctor will need to tell us why. Note: Some covered drug(s) may have quantity limits. Please refer to the formulary for details.

## 2023-05-08 ENCOUNTER — Other Ambulatory Visit: Payer: Self-pay | Admitting: Primary Care

## 2023-05-08 MED ORDER — ECONAZOLE NITRATE 1 % EX CREA *I*
TOPICAL_CREAM | Freq: Every day | CUTANEOUS | 1 refills | Status: DC
Start: 2023-05-08 — End: 2024-09-02

## 2023-05-21 ENCOUNTER — Ambulatory Visit: Payer: Medicare Other | Attending: Optometry | Admitting: Optometry

## 2023-05-21 ENCOUNTER — Encounter: Payer: Self-pay | Admitting: Optometry

## 2023-05-21 ENCOUNTER — Other Ambulatory Visit: Payer: Self-pay

## 2023-05-21 DIAGNOSIS — H2513 Age-related nuclear cataract, bilateral: Secondary | ICD-10-CM | POA: Insufficient documentation

## 2023-05-21 DIAGNOSIS — D3131 Benign neoplasm of right choroid: Secondary | ICD-10-CM | POA: Insufficient documentation

## 2023-05-21 NOTE — Progress Notes (Signed)
Assessment  1. Choroidal nevus of right eye  Stable   NL RPE/foveal contour OU    2. Nuclear sclerotic cataract of both eyes  OS>OD        Plan:        Plan  Discussed findings and addressed concerns. Elects to defer cat ext as not bothered significantly, does not drive. RTC in 25M w/ BAT, topo, Mac OCT, DFE OU for periodic care, monitor cat progression OU, anticipate intervention in 6-64M.

## 2023-05-25 ENCOUNTER — Encounter: Payer: Self-pay | Admitting: Gastroenterology

## 2023-05-31 ENCOUNTER — Ambulatory Visit: Payer: Medicare Other | Attending: Neurology | Admitting: Neurology

## 2023-05-31 ENCOUNTER — Other Ambulatory Visit: Payer: Self-pay

## 2023-05-31 VITALS — BP 145/69 | HR 69 | Temp 96.3°F | Wt 105.0 lb

## 2023-05-31 DIAGNOSIS — I69398 Other sequelae of cerebral infarction: Secondary | ICD-10-CM | POA: Insufficient documentation

## 2023-05-31 DIAGNOSIS — R569 Unspecified convulsions: Secondary | ICD-10-CM | POA: Insufficient documentation

## 2023-05-31 NOTE — Patient Instructions (Signed)
Please reach out to the clinic with any additional questions or concerns. We encourage you to sign up and use MyChart for any non-urgent medical questions as MyChart is the preferred method of communication.  Please contact your pharmacy for medication refills and allow up to 2 business days for routine prescription refills.  For urgent messages, please call the clinic at [phone].  The clinic is open from 8am to 4:30pm Monday through Friday.    You can get [facility] appointment reminders by text - please make sure we have your cell phone number on file at check in/check-out.  Then you can just text [facility] to [phone] to receive appointment messages.     Continue carbamazepine at same dose.  Agree with regular PT as tolerated  Follow up in 6 months

## 2023-06-01 ENCOUNTER — Ambulatory Visit (INDEPENDENT_AMBULATORY_CARE_PROVIDER_SITE_OTHER): Payer: Medicare Other | Admitting: Nurse Practitioner

## 2023-06-01 VITALS — BP 130/81 | HR 69 | Temp 98.6°F | Ht 67.0 in | Wt 166.4 lb

## 2023-06-01 DIAGNOSIS — G459 Transient cerebral ischemic attack, unspecified: Secondary | ICD-10-CM | POA: Diagnosis not present

## 2023-06-01 NOTE — Progress Notes (Signed)
Established Patient Office Visit  Subjective   Patient ID: Ariel Simpson, female    DOB: [birth year]  Age: 67 y.o. MRN: [id number]  Chief Complaint  Patient presents with   Eye Problem    The sudden inability not able to focus, lasted about 2 minutes     Patient has for acute visit for the above.  Yesterday she was using the bathroom and her vision went blurry bilaterally.  It lasted for about 2 minutes and then resolved.  She was not having any dizziness or chest pain.  She reports a similar episode occurred shortly after open heart surgery approximately 10 years ago.  At that time it was thought that she had TIA and she underwent evaluation with neurology.  She follows up with eye doctors routinely, she reports no known ocular disease.  She is currently on daily baby aspirin, atorvastatin with last LDL of 81 back in October 4098.  For management of blood pressure she is on losartan 25 mg twice a day and metoprolol succinate 25 mg twice a day.  Her at home blood pressure cuff identifies labile blood pressures with some systolic blood pressure readings around 150.      Review of Systems  Eyes:  Positive for blurred vision.  Respiratory:  Negative for shortness of breath.   Cardiovascular:  Negative for chest pain and palpitations.      Objective:     BP 130/81 Comment: right arm  Pulse 69   Temp 98.6 F (37 C) (Temporal)   Ht 5\' 7"  (1.702 m)   Wt 166 lb 6 oz (75.5 kg)   SpO2 95%   BMI 26.06 kg/m  BP Readings from Last 3 Encounters:  06/01/23 130/81  01/18/23 (!) 146/88  01/12/23 128/88   Wt Readings from Last 3 Encounters:  06/01/23 166 lb 6 oz (75.5 kg)  01/18/23 169 lb (76.7 kg)  01/12/23 170 lb (77.1 kg)      Physical Exam Vitals reviewed.  Constitutional:      General: She is not in acute distress.    Appearance: Normal appearance.  HENT:     Head: Normocephalic and atraumatic.  Eyes:     Extraocular Movements: Extraocular movements intact.     Pupils:  Pupils are equal, round, and reactive to light.  Cardiovascular:     Rate and Rhythm: Normal rate and regular rhythm.     Pulses: Normal pulses.     Heart sounds: Normal heart sounds.  Pulmonary:     Effort: Pulmonary effort is normal.     Breath sounds: Normal breath sounds.  Skin:    General: Skin is warm and dry.  Neurological:     General: No focal deficit present.     Mental Status: She is alert and oriented to person, place, and time.     Cranial Nerves: Cranial nerves 2-12 are intact.     Sensory: Sensation is intact.     Motor: Motor function is intact.     Coordination: Coordination is intact.     Gait: Gait is intact.  Psychiatric:        Mood and Affect: Mood normal.        Behavior: Behavior normal.        Judgment: Judgment normal.      No results found for any visits on 06/01/23.    The 10-year ASCVD risk score (Arnett DK, et al., 2019) is: 10.8%    Assessment & Plan:   Problem List  Items Addressed This Visit       Cardiovascular and Mediastinum   TIA (transient ischemic attack) - Primary    Acute Last episode was yesterday lasted for 2 minutes and then resolved. Referral to neurology for further evaluation made today. At this point recommend she continue aspirin 81 mg daily, atorvastatin 20 mg daily, losartan 25 mg twice a day, and metoprolol 25 mg twice a day.  We did discuss role of adding Plavix however her ABCD2 score to predict stroke likelihood after TIA is 1-2.  Thus, Plavix was not recommended at this time. She was told to proceed to the emergency department if this occurs again.      Relevant Orders   Ambulatory referral to Neurology   **Of note, patient is requesting recommendations for new cardiologist as hers has retired.  I have given her a couple of cardiologist's names.   Total time spent on encounter today was 33 minutes including face-to-face evaluation, review of previous records, and development/discussion of treatment  plan. Return if symptoms worsen or fail to improve.    Elenore Paddy, NP

## 2023-06-01 NOTE — Assessment & Plan Note (Signed)
Acute Last episode was yesterday lasted for 2 minutes and then resolved. Referral to neurology for further evaluation made today. At this point recommend she continue aspirin 81 mg daily, atorvastatin 20 mg daily, losartan 25 mg twice a day, and metoprolol 25 mg twice a day.  We did discuss role of adding Plavix however her ABCD2 score to predict stroke likelihood after TIA is 1-2.  Thus, Plavix was not recommended at this time. She was told to proceed to the emergency department if this occurs again.

## 2023-06-01 NOTE — Patient Instructions (Addendum)
Dr. Lucia Gaskins Dr. Jacinto Halim Dr. Diona Browner

## 2023-06-04 NOTE — Progress Notes (Signed)
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    I last saw her about 6 months ago and since that time she has been doing well.  She has had no recurrent falls.  She and her husband have settled into the condo.  She continues to work about 10 to 15 hours/week.  This has been going well.    She receives physical therapy roughly every week or so.  She finds this also very helpful in maintaining good balance.    Examination:  Vitals:    05/31/23 0908   BP: 145/69   Pulse: 69   Temp: 35.7 C (96.3 F)   Weight: 47.6 kg (105 lb)     She is alert and oriented.  Her speech remains clear.  Comprehension is intact.  Affect is appropriate.  She continues to perform her work without any issues.    Visual fields are full to confrontation.  She has full versions.  There is no nystagmus.  Strength appears intact in both upper extremities.  She continues to have a very mild sustention tremor.  Gait is unsteady but unchanged.    67 year old woman with a history of AVM resection and residual seizures.  She has been tolerating the carbamazepine well with no side effects.  I will make no changes in her dosing at this time.  I plan on seeing her again in 6 months.

## 2023-06-06 DIAGNOSIS — E785 Hyperlipidemia, unspecified: Secondary | ICD-10-CM | POA: Diagnosis not present

## 2023-06-06 DIAGNOSIS — I10 Essential (primary) hypertension: Secondary | ICD-10-CM | POA: Diagnosis not present

## 2023-06-06 DIAGNOSIS — I480 Paroxysmal atrial fibrillation: Secondary | ICD-10-CM | POA: Diagnosis not present

## 2023-06-08 ENCOUNTER — Other Ambulatory Visit
Admission: RE | Admit: 2023-06-08 | Discharge: 2023-06-08 | Disposition: A | Discharge status: Home / Self Care | Payer: Medicare Other | Source: Ambulatory Visit | Attending: Obstetrics and Gynecology | Admitting: Obstetrics and Gynecology

## 2023-06-08 DIAGNOSIS — M810 Age-related osteoporosis without current pathological fracture: Secondary | ICD-10-CM | POA: Insufficient documentation

## 2023-06-08 LAB — MAGNESIUM: Magnesium: 2.3 mg/dL (ref 1.6–2.5)

## 2023-06-08 LAB — BASIC METABOLIC PANEL
Anion Gap: 11 (ref 7–16)
CO2: 29 mmol/L — ABNORMAL HIGH (ref 20–28)
Calcium: 9.4 mg/dL (ref 8.6–10.2)
Chloride: 98 mmol/L (ref 96–108)
Creatinine: 0.58 mg/dL (ref 0.51–0.95)
Glucose: 99 mg/dL (ref 60–99)
Lab: 19 mg/dL (ref 6–20)
Potassium: 4.6 mmol/L (ref 3.3–5.1)
Sodium: 138 mmol/L (ref 133–145)
eGFR BY CREAT: 98 *

## 2023-06-10 ENCOUNTER — Other Ambulatory Visit: Payer: Self-pay | Admitting: Primary Care

## 2023-06-12 ENCOUNTER — Ambulatory Visit: Payer: Medicare Other | Attending: Orthopaedic Surgery | Admitting: Physician Assistant

## 2023-06-12 ENCOUNTER — Encounter: Payer: Self-pay | Admitting: Primary Care

## 2023-06-12 ENCOUNTER — Ambulatory Visit
Admission: RE | Admit: 2023-06-12 | Discharge: 2023-06-12 | Disposition: A | Discharge status: Home / Self Care | Payer: Medicare Other | Source: Ambulatory Visit

## 2023-06-12 ENCOUNTER — Other Ambulatory Visit: Payer: Self-pay

## 2023-06-12 VITALS — BP 140/60 | HR 78 | Ht 62.0 in | Wt 105.0 lb

## 2023-06-12 DIAGNOSIS — M19071 Primary osteoarthritis, right ankle and foot: Secondary | ICD-10-CM

## 2023-06-12 DIAGNOSIS — M7989 Other specified soft tissue disorders: Secondary | ICD-10-CM

## 2023-06-12 DIAGNOSIS — M25571 Pain in right ankle and joints of right foot: Secondary | ICD-10-CM

## 2023-06-12 DIAGNOSIS — S82831A Other fracture of upper and lower end of right fibula, initial encounter for closed fracture: Secondary | ICD-10-CM

## 2023-06-12 MED ORDER — GENERIC DME *A*
0 refills | Status: DC
Start: 2023-06-12 — End: 2024-06-20

## 2023-06-12 NOTE — Progress Notes (Addendum)
Supervising Physician:  Dr. Hedda Slade      CC:  Right ankle pain.      HPI:  [name] is a 67 year old female with a past medical history significant for stroke, hypertension, seizure disorder, hypothyroidism, GERD and osteoporosis who is accompanied by her husband today and presents with right ankle pain.  She states around 10:00 AM this morning she was getting out of her car, stepped on the edge of the driveway and rolled her right foot.  She denies falling.  She had immediate pain in her right ankle and trouble bearing weight.  She has used ice and taken Advil for pain.  Per her husband she sustained an injury to her Achilles in the 83s which took 13 years to heal.      PMH:  Past Medical History:   Diagnosis Date    Contusion of foot 06/02/2013    Hypertension     Hypothyroidism     Osteoporosis     Seizure     Stroke        PSH:  Past Surgical History:   Procedure Laterality Date    CONVERTED PROCEDURE      Craniotomy A-V Malformation Repair Conversion Data     HX TONSILLECTOMY/ADENOIDECTOMY      Tonsillectomy Conversion Data     TONSILLECTOMY         Medications:  Current Outpatient Medications   Medication Sig    carBAMazepine (CARBATROL) 100 mg 12 hr capsule TAKE 1 CAPSULE BY MOUTH TWO TIMES DAILY    azelastine (ASTEPRO) 137 MCG/SPRAY SOLN nasal solution SPRAY 2 SPRAYS INTO EACH NOSTRIL TWO TIMES DAILY    econazole nitrate (SPECTAZOLE) 1 % cream Apply topically daily. to the following areas: TOES/TOENAIL    RABEprazole (ACIPHEX) 20 MG tablet TAKE 1 TABLET BY MOUTH EVERY DAY    doxycycline hyclate (VIBRA-TABS) 100 mg tablet Take 1 tablet (100 mg total) by mouth 2 times daily for Acute Bacterial Infection of the Sinuses.    atorvastatin (LIPITOR) 10 mg tablet TAKE 1 TABLET BY MOUTH EVERY DAY WITH DINNER    generic DME PT evaluate and treat, strengthening and conditioning dx: right foot and back pain    amLODIPine (NORVASC) 5 mg tablet TAKE 1 TABLET BY MOUTH EVERY DAY    carBAMazepine (TEGRETOL XR) 200  mg 12 hr tablet TAKE 2 TABLETS BY MOUTH TWO TIMES DAILY WITH MEALS ALONG WITH 100 MG TABLET. SWALLOW WHOLE, DO NOT BREAK, CRUSH OR CHEW    ketoconazole (NIZORAL) 2 % cream Apply topically daily  to the following areas: TOES/TOENAIL    solifenacin (VESICARE) 10 mg tablet Take 1 tablet (10 mg total) by mouth daily    guaiFENesin (MUCINEX) 600 MG 12 hr tablet Take 2 tablets (1,200 mg total) by mouth 2 times daily.    levothyroxine (SYNTHROID, LEVOTHROID) 25 mcg tablet TAKE 1 TABLET BY MOUTH EVERY DAY BEFORE BREAKFAST    fluticasone (FLOVENT DISKUS) 250 MCG/ACT diskus inhaler Inhale 1 puff into the lungs 2 times daily    generic DME Bilateral custom orthotics; Dx: bilateral foot pain    metroNIDAZOLE (METROCREAM) 0.75 % cream Apply topically 2 times daily    famotidine (PEPCID) 20 mg tablet Take 1 tablet (20 mg total) by mouth 2 times daily    pseudoephedrine (SUDAFED) 30 mg tablet TAKE 1 TABLET BY MOUTH EVERY 4 HOURS AS NEEDED FOR CONGESTION    [facility]S 12 HOUR NASAL DECONGESTANT 120 MG 12 hr tablet TAKE 1 TABLET BY MOUTH  EVERY 12 HOURS    triamcinolone (KENALOG) 0.1 % ointment Apply topically 2 times daily to the following areas: ear    beclomethasone (QNASL) 80 MCG/ACT nasal spray 2 sprays by Nasal route daily    Non-System Medication Bilateral custom-made orthotics  Dx: bilateral foot pain, history of stroke, disp: 1pair    Calcium Carb-Cholecalciferol 600-200 MG-UNIT TABS Take 1 tablet by mouth 3 times daily    Denosumab (PROLIA SC) Inject 1 Device into the skin every 6 months    loratadine (CLARITIN) 10 mg tablet Take 1 tablet (10 mg total) by mouth daily.    Multiple Vitamin (MULTIVITAMIN) per tablet Take 1 tablet by mouth daily.    cholecalciferol (VITAMIN D) 1000 UNITS capsule Take 1 capsule (1,000 units total) by mouth daily.       Allergies:  Allergies   Allergen Reactions    Penicillins Hives    Sulfa Antibiotics Other (See Comments)     Serum sickness    Bactrim Other (See Comments)     Serum sickness     Phenytoin Sodium Extended Swelling and Other (See Comments)      Edema    Seasonal Allergies Other (See Comments)     Trees,pollen,hay fever,mold          Physical Exam:    Vitals:    06/12/23 1742   BP: 140/60   Pulse: 78   Weight: 47.6 kg (105 lb)   Height: 1.575 m (5\' 2" )     General:  Appears in no acute distress.  Respiratory:  Non labored pattern.  Extremities:   Left lower extremity:  Calf soft and nontender to palpation, no discomfort with ankle plantarflexion, dorsiflexion, inversion or eversion, palpable dorsalis pedal pulse, brisk capillary refill, sensation intact light touch.   Right lower extremity:  Proximal fibula nontender to palpation, calf soft and nontender to palpation, no significant ankle swelling, no appreciable ecchymosis, discomfort with active plantarflexion and dorsiflexion, she tolerates active inversion and eversion, distal fibula tender to palpation, no significant tenderness to palpation over the lateral ligaments, no tenderness to palpation over the medial malleolus or deltoid ligaments, Achilles tendon nontender to palpation and there is no palpable defect, midfoot and forefoot nontender to palpation, no plantar ecchymosis, palpable dorsalis pedal pulse, brisk capillary refill, sensation intact light touch.      Imaging:  Weightbearing x-rays of her right ankle were obtained in the office today which revealed a subtle cortical irregularity at the tip of the distal fibula consistent with an avulsion fracture.  Mortise is intact.                  Diagnosis:  Right ankle distal fibular avulsion fracture.      Assessment/Plan:   All clinical and radiographic findings were reviewed in detail with [name] and her husband today and all of their questions and concerns were answered to their satisfaction.  She will continue to weight-bear as tolerated and was provided a prescription for a walker which she will use as needed.  I offered her a high tide boot which she politely declined stating  she would have difficulty ambulating with it on.  I offered her an ASO which she politely declined stating it caused more pain.  I wrapped her ankle with an Ace bandage which she will wear as needed for comfort and support.  I recommend she work on gentle ankle motion to prevent stiffness, elevate and use ice to help with swelling and take an over-the-counter anti-inflammatory  with food and/or Tylenol as needed for pain.  She will follow-up in 4 weeks for repeat clinical exam but will contact the office with any questions or concerns in the interim.      Scarlette Ar PA-C  [facility] Department of Orthopaedics & Rehabilitation

## 2023-06-13 ENCOUNTER — Telehealth: Payer: Self-pay | Admitting: Primary Care

## 2023-06-13 DIAGNOSIS — M25579 Pain in unspecified ankle and joints of unspecified foot: Secondary | ICD-10-CM

## 2023-06-13 NOTE — Telephone Encounter (Signed)
Pls let her know that the referral was placed to Dr. Pierce Crane

## 2023-06-13 NOTE — Telephone Encounter (Signed)
Pt.notified

## 2023-06-13 NOTE — Telephone Encounter (Signed)
Patient called stating she hurt her ankle and went to Urgent care. Patient would like to have a referral to an Ortho doctor.

## 2023-06-13 NOTE — Addendum Note (Signed)
Addended by: Deirdre Priest on: 06/13/2023 01:18 PM     Modules accepted: Orders

## 2023-06-20 ENCOUNTER — Ambulatory Visit: Payer: Medicare Other

## 2023-06-20 DIAGNOSIS — I495 Sick sinus syndrome: Secondary | ICD-10-CM

## 2023-06-21 ENCOUNTER — Other Ambulatory Visit: Payer: Self-pay

## 2023-06-21 ENCOUNTER — Encounter: Payer: Self-pay | Admitting: Orthopedic Surgery

## 2023-06-21 ENCOUNTER — Ambulatory Visit
Admission: RE | Admit: 2023-06-21 | Discharge: 2023-06-21 | Disposition: A | Discharge status: Home / Self Care | Payer: Medicare Other | Source: Ambulatory Visit

## 2023-06-21 ENCOUNTER — Ambulatory Visit: Payer: Medicare Other | Admitting: Orthopedic Surgery

## 2023-06-21 VITALS — Ht 62.0 in | Wt 105.0 lb

## 2023-06-21 DIAGNOSIS — M85871 Other specified disorders of bone density and structure, right ankle and foot: Secondary | ICD-10-CM

## 2023-06-21 DIAGNOSIS — M810 Age-related osteoporosis without current pathological fracture: Secondary | ICD-10-CM

## 2023-06-21 DIAGNOSIS — M25571 Pain in right ankle and joints of right foot: Secondary | ICD-10-CM

## 2023-06-21 DIAGNOSIS — S93401A Sprain of unspecified ligament of right ankle, initial encounter: Secondary | ICD-10-CM

## 2023-06-21 DIAGNOSIS — S82831A Other fracture of upper and lower end of right fibula, initial encounter for closed fracture: Secondary | ICD-10-CM

## 2023-06-21 DIAGNOSIS — S8261XD Displaced fracture of lateral malleolus of right fibula, subsequent encounter for closed fracture with routine healing: Secondary | ICD-10-CM

## 2023-06-21 NOTE — Progress Notes (Signed)
Answers submitted by the patient for this visit:  Right foot (Submitted on 06/18/2023)  Chief Complaint: Right foot pain  What is your goal for today's visit?: Diagnosis and treatment  Date of onset: : 06/11/2023  Was this the result of an injury?: Yes  What is your pain level?: 3/10  Please describe the quality of your pain: : discomfort, instability, sharp, sore, tenderness  What diagnostic workup have you had for this condition?: X-ray  What treatments have you tried for this condition?: acetaminophen, activity modification, bracing, ice, immobilization  Progression since onset: : gradually improving  Is this a work related condition? : No  Current work status: : usual activities       Subjective     Remy is a 67 y.o. female who presents for New Patient Visit of the Right Ankle, for another evaluation and opinion  History of Present Illness  The patient presents for a new patient visit for her right ankle injury. She is accompanied by an adult female, her husband.    She sustained an injury to her right ankle on 06/12/2023 after rolling it on uneven terrain. Immediate care same day was sought at an orthopedic urgent care facility seen by Scarlette Ar, [facility], where she was diagnosed with a severe sprain and a small fracture. Given her history of severe osteoporosis, she felt it necessary to seek further evaluation and an opinion.    She has been managing her osteoporosis with [facility] injections [facility]. She is currently undergoing physical therapy and performing exercises as part of her routine.    For her ankle injury, she has been using an ankle sleeve brace for support but has not opted for a boot or sport brace that was offered as an option due to anticipated difficulties in walking and balance. She notes her pain and swelling is much better compared to the day of the injury, pleased with her progress.  The accompanying adult female notes that she overstretched her Achilles tendon years  ago, which still causes a slight limp.       Objective   Height 1.575 m (5\' 2" ), weight 47.6 kg (105 lb).  Physical Exam  Right ankle shows very mild swelling over the lateral aspect. Ankle dorsiflexion is possible, tibialis anterior tendon is intact and functioning. Achilles tendon shows no palpable defect. Mild tenderness is present along the lateral ankle ligaments and at the tip of the distal fibula. No tenderness or swelling is noted along the medial ankle. Foot itself shows no focal swelling.    Results  Imaging    X-rays from June 12, 2023 show a fracture of the low distal fibula without any displacement, osteopenia, and symmetric ankle mortise.     X-Rays from today's visit personally reviewed, 3 views of the RIGHT ANKLE WB, interpreted and show:    X-rays from June 21, 2023 show the very low small distal fibula fracture remains stable with no significant change in position or alignment, no displacement, ankle mortise symmetric.        Diagnosis  1. Closed avulsion fracture of distal fibula, right, initial encounter        2. Right ankle pain  * Ankle RIGHT standard AP, lateral, mortise views      3. Right ankle sprain        4. Osteoporosis, unspecified osteoporosis type, unspecified pathological fracture presence                Assessment &  Plan  1. Right ankle injury.  Note reviewed, and agree with the evaluation and treatment recommendations per Scarlette Ar, PA.  The right ankle injury, an inversion sprain with a small distal fibula fracture, appears to be healing well. Pain and swelling are nicely reducing, and the x-ray findings are showing a stable injury, no displacement small distal fibula fracture.     Up and down movements are recommended, avoiding side-to-side motion, to facilitate bone and ligament healing. A regimen of three sets of five repetitions during breakfast, lunch, and dinner is advised. Agree with the use of a walker is suggested for safety during the healing process. She should  keep her scheduled appointment with Morrie Sheldon in 3 to 4 weeks.    2. Osteoporosis.  She is currently receiving Prolia shots at [facility] for osteoporosis management. Continuation of this treatment is recommended.      Patient Instructions   Up and down ankle motion exercises, 3 sets of 5 with meals, 3 times per day     Documents reviewed: 06/12/23 [facility] provider note, [place], PA/ Dr Daley-Lindo  Communications completed: NA     Orders: NA   DME: NA        Follow up interval (weeks/months): as scheduled with Scarlette Ar, PA 07/10/23.    Time / MDM: MODERATE level of MDM, based on 2 out of 3 elements of MDM.   Second opinion.           Halina Andreas, MD  Professor of Orthopaedics  [facility]      Author: Jenna Luo, MD  Note signed: 06/21/2023

## 2023-06-21 NOTE — Patient Instructions (Signed)
Up and down ankle motion exercises, 3 sets of 5 with meals, 3 times per day

## 2023-06-24 ENCOUNTER — Other Ambulatory Visit: Payer: Self-pay | Admitting: Primary Care

## 2023-06-26 ENCOUNTER — Encounter: Payer: Self-pay | Admitting: Internal Medicine

## 2023-06-26 DIAGNOSIS — G459 Transient cerebral ischemic attack, unspecified: Secondary | ICD-10-CM

## 2023-07-03 ENCOUNTER — Encounter: Payer: Self-pay | Admitting: Internal Medicine

## 2023-07-03 DIAGNOSIS — F5104 Psychophysiologic insomnia: Secondary | ICD-10-CM

## 2023-07-03 MED ORDER — ZOLPIDEM TARTRATE 5 MG PO TABS
ORAL_TABLET | ORAL | 1 refills | Status: AC
Start: 2023-07-03 — End: ?

## 2023-07-04 ENCOUNTER — Other Ambulatory Visit: Payer: Self-pay | Admitting: Internal Medicine

## 2023-07-04 NOTE — Progress Notes (Signed)
Remote pacemaker transmission.   

## 2023-07-10 ENCOUNTER — Ambulatory Visit: Payer: Medicare Other | Admitting: Physician Assistant

## 2023-07-11 ENCOUNTER — Ambulatory Visit: Payer: Medicare Other | Attending: Orthopaedic Surgery | Admitting: Physician Assistant

## 2023-07-11 ENCOUNTER — Encounter: Payer: Self-pay | Admitting: Physician Assistant

## 2023-07-11 ENCOUNTER — Other Ambulatory Visit: Payer: Self-pay

## 2023-07-11 VITALS — BP 160/80 | Ht 62.0 in | Wt 105.0 lb

## 2023-07-11 DIAGNOSIS — S82831D Other fracture of upper and lower end of right fibula, subsequent encounter for closed fracture with routine healing: Secondary | ICD-10-CM

## 2023-07-11 NOTE — Progress Notes (Signed)
Supervising Physician:  Dr. Hedda Slade      HPI:  [name] returns in follow-up for her right ankle distal fibular avulsion fracture.  She is accompanied by her husband today.  She was getting out of her car on 06/12/2023, stepped on the edge of the driveway and rolled her right foot.  She presented to the orthopedic urgent care later that day, had x-rays of her ankle which revealed a subtle cortical irregularity at the tip of the distal fibula consistent with an avulsion fracture and was wrapped with an Ace bandage as she declined a high tide boot or ASO.  She requested a second opinion and was seen by Dr. Pierce Crane on 06/21/2023 and was encouraged to work on gentle ankle motion. She is overall feeling a lot better.  She continues to report pain on uneven ground.  She bought an ankle sleeve online which she has been wearing for comfort and support.  She takes Tylenol as needed for pain.      PMH:  Past Medical History:   Diagnosis Date    Contusion of foot 06/02/2013    Hypertension     Hypothyroidism     Osteoporosis     Seizure     Stroke        PSH:  Past Surgical History:   Procedure Laterality Date    CONVERTED PROCEDURE      Craniotomy A-V Malformation Repair Conversion Data     HX TONSILLECTOMY/ADENOIDECTOMY      Tonsillectomy Conversion Data     TONSILLECTOMY         Medications:  Current Outpatient Medications   Medication Sig    levothyroxine (SYNTHROID, LEVOTHROID) 25 mcg tablet TAKE 1 TABLET BY MOUTH EVERY DAY BEFORE BREAKFAST    generic DME Walker.  Use as directed.    carBAMazepine (CARBATROL) 100 mg 12 hr capsule TAKE 1 CAPSULE BY MOUTH TWO TIMES DAILY    azelastine (ASTEPRO) 137 MCG/SPRAY SOLN nasal solution SPRAY 2 SPRAYS INTO EACH NOSTRIL TWO TIMES DAILY    econazole nitrate (SPECTAZOLE) 1 % cream Apply topically daily. to the following areas: TOES/TOENAIL    RABEprazole (ACIPHEX) 20 MG tablet TAKE 1 TABLET BY MOUTH EVERY DAY    doxycycline hyclate (VIBRA-TABS) 100 mg tablet Take 1 tablet (100  mg total) by mouth 2 times daily for Acute Bacterial Infection of the Sinuses.    atorvastatin (LIPITOR) 10 mg tablet TAKE 1 TABLET BY MOUTH EVERY DAY WITH DINNER    generic DME PT evaluate and treat, strengthening and conditioning dx: right foot and back pain    amLODIPine (NORVASC) 5 mg tablet TAKE 1 TABLET BY MOUTH EVERY DAY    carBAMazepine (TEGRETOL XR) 200 mg 12 hr tablet TAKE 2 TABLETS BY MOUTH TWO TIMES DAILY WITH MEALS ALONG WITH 100 MG TABLET. SWALLOW WHOLE, DO NOT BREAK, CRUSH OR CHEW    ketoconazole (NIZORAL) 2 % cream Apply topically daily  to the following areas: TOES/TOENAIL    solifenacin (VESICARE) 10 mg tablet Take 1 tablet (10 mg total) by mouth daily    guaiFENesin (MUCINEX) 600 MG 12 hr tablet Take 2 tablets (1,200 mg total) by mouth 2 times daily.    fluticasone (FLOVENT DISKUS) 250 MCG/ACT diskus inhaler Inhale 1 puff into the lungs 2 times daily    generic DME Bilateral custom orthotics; Dx: bilateral foot pain    metroNIDAZOLE (METROCREAM) 0.75 % cream Apply topically 2 times daily    famotidine (PEPCID) 20 mg tablet Take  1 tablet (20 mg total) by mouth 2 times daily    pseudoephedrine (SUDAFED) 30 mg tablet TAKE 1 TABLET BY MOUTH EVERY 4 HOURS AS NEEDED FOR CONGESTION    [facility] 12 HOUR NASAL DECONGESTANT 120 MG 12 hr tablet TAKE 1 TABLET BY MOUTH EVERY 12 HOURS    triamcinolone (KENALOG) 0.1 % ointment Apply topically 2 times daily to the following areas: ear    beclomethasone (QNASL) 80 MCG/ACT nasal spray 2 sprays by Nasal route daily    Non-System Medication Bilateral custom-made orthotics  Dx: bilateral foot pain, history of stroke, disp: 1pair    Calcium Carb-Cholecalciferol 600-200 MG-UNIT TABS Take 1 tablet by mouth 3 times daily    Denosumab (PROLIA SC) Inject 1 Device into the skin every 6 months    loratadine (CLARITIN) 10 mg tablet Take 1 tablet (10 mg total) by mouth daily.    Multiple Vitamin (MULTIVITAMIN) per tablet Take 1 tablet by mouth daily.    cholecalciferol (VITAMIN D)  1000 UNITS capsule Take 1 capsule (1,000 units total) by mouth daily.       Allergies:  Allergies   Allergen Reactions    Penicillins Hives    Sulfa Antibiotics Other (See Comments)     Serum sickness    Bactrim Other (See Comments)     Serum sickness    Phenytoin Sodium Extended Swelling and Other (See Comments)      Edema    Seasonal Allergies Other (See Comments)     Trees,pollen,hay fever,mold          Physical Exam:     Vitals:    07/11/23 0840   BP: 160/80   Weight: 47.6 kg (105 lb)   Height: 1.575 m (5\' 2" )     General:  Appears in no acute distress and ambulates with the aid of a rollator walker.  Respiratory:  Non labored pattern.  Extremities:   Right lower extremity:  Calf soft and nontender to palpation, no significant ankle swelling, no appreciable ecchymosis, she tolerates active ankle plantarflexion, dorsiflexion, inversion and eversion, no significant tenderness to palpation of the distal fibula, mild tenderness to palpation over the lateral ligaments, no tenderness to palpation of the medial malleolus or deltoid ligaments, negative anterior drawer test, midfoot and forefoot nontender to palpation, no plantar ecchymosis, palpable dorsalis pedal pulse, brisk capillary refill, sensation intact light touch.      Diagnosis:  Right ankle distal fibular avulsion fracture, improving.      Assessment/Plan:  All clinical findings were reviewed in detail with Dara and her husband today and all of their questions and concerns were answered to their satisfaction.  She will continue to weight-bear as tolerated with the aid of her rollator walker.  She will wear her ankle sleeve as needed for comfort and support.  I recommend she elevate and use ice to help with any swelling and take Tylenol as needed for pain.  She was referred for formal physical therapy.  She will slowly progress her activities as tolerated and let pain guide her function.  She no longer needs to follow-up with the orthopedic urgent care but  will contact the office with any questions or concerns.  If her symptoms persist or worsen recommendation would be to follow-up with Dr. Pierce Crane for further management care.      Scarlette Ar PA-C  [facility] Department of Orthopaedics & Rehabilitation      Answers submitted by the patient for this visit:  Right Ankle (Submitted on 07/05/2023)  Chief Complaint: Unknown  What is your goal for today's visit?: Follow up, assess progress of healing  Date of onset: : 06/12/2023  Was this the result of an injury?: Yes  What is your pain level?: 4/10  Please describe the quality of your pain: : aching, discomfort, dull ache, sore, tenderness  What diagnostic workup have you had for this condition?: X-ray  What treatments have you tried for this condition?: acetaminophen, activity modification, bracing, ice, immobilization, NSAIDs  Progression since onset: : gradually improving  Is this a work related condition? : No  Current work status: : usual activities

## 2023-07-17 ENCOUNTER — Ambulatory Visit: Payer: Medicare Other

## 2023-07-17 ENCOUNTER — Other Ambulatory Visit: Payer: Self-pay

## 2023-07-17 ENCOUNTER — Telehealth: Payer: Self-pay | Admitting: Primary Care

## 2023-07-17 VITALS — BP 140/90 | HR 75 | Temp 97.8°F | Ht 63.0 in | Wt 105.0 lb

## 2023-07-17 DIAGNOSIS — Z20822 Contact with and (suspected) exposure to covid-19: Secondary | ICD-10-CM | POA: Insufficient documentation

## 2023-07-17 DIAGNOSIS — R0981 Nasal congestion: Secondary | ICD-10-CM | POA: Insufficient documentation

## 2023-07-17 DIAGNOSIS — Z20828 Contact with and (suspected) exposure to other viral communicable diseases: Secondary | ICD-10-CM | POA: Insufficient documentation

## 2023-07-17 DIAGNOSIS — Z1152 Encounter for screening for COVID-19: Secondary | ICD-10-CM | POA: Insufficient documentation

## 2023-07-17 LAB — COVID/INFLUENZA A & B/RSV NAAT (PCR): Influenza A NAAT (PCR): NEGATIVE

## 2023-07-17 LAB — DATE/TIME NOT PROVIDED

## 2023-07-17 NOTE — Progress Notes (Signed)
07/17/2023  REASON FOR VISIT: Sinus Problem    SUBJECTIVE:    [name] is a 68 y.o. female who presents today for sinus congestion for the past 5 days.  Endorses nasal/headache congestion, pressure and clear drainage.  Has PND, fatigue, and ear fullness. Denies fever, chills and body aches. Denies cough, sore throat, nausea, vomiting, and diarrhea. They have tried mucinex and oral/ nasal antihistamine every day. Husband just got treated for a sinus infection. Patient has not tested for COVID at home. Has season allergies, has been outside more lately.     OBJECTIVE: BP 140/90   Pulse 75   Temp 36.6 C (97.8 F)   Ht 1.6 m (5\' 3" )   Wt 47.6 kg (105 lb)   SpO2 100%   BMI 18.60 kg/m   Physical Exam  Vitals reviewed.   HENT:      Head: Normocephalic and atraumatic.      Right Ear: Tympanic membrane, ear canal and external ear normal. Tympanic membrane is not injected.      Left Ear: Tympanic membrane, ear canal and external ear normal. Tympanic membrane is not injected.      Nose: Mucosal edema present. No congestion or rhinorrhea.      Right Sinus: Maxillary sinus tenderness present. No frontal sinus tenderness.      Left Sinus: Maxillary sinus tenderness present. No frontal sinus tenderness.      Mouth/Throat:      Mouth: Mucous membranes are moist.      Pharynx: No posterior oropharyngeal erythema.      Tonsils: No tonsillar exudate.   Cardiovascular:      Rate and Rhythm: Normal rate and regular rhythm.   Pulmonary:      Effort: Pulmonary effort is normal.      Breath sounds: Normal breath sounds. No decreased breath sounds, wheezing, rhonchi or rales.   Musculoskeletal:      Cervical back: Neck supple.   Lymphadenopathy:      Cervical: No cervical adenopathy.   Skin:     General: Skin is warm.           ASSESSMENT & PLAN:   1. Sinus congestion  Differential diagnosis includes viral URI, including COVID flu or RSV, bacterial sinusitis  - COVID/Influenza A & B/RSV NAAT (PCR); Future  - Patient  encouraged to continue supportive measures Itxel Wickard as getting adequate rest, keeping hydrated, drinking warm liquids, humidified air, over-the-counter cold medications as needed  -If COVID test is negative, plan to contact the patient tomorrow and see if she is feeling any better.  If she is not, consider treating for acute bacterial sinusitis with doxycycline      RTC precautions discussed.     There are no Patient Instructions on file for this visit.    ORDERS:    Orders Placed This Encounter   Procedures    COVID/Influenza A & B/RSV NAAT (PCR)     FOLLOW-UP: Follow up for As Scheduled.      Joni Reining Yonael Tulloch, PA-C    This note was created using Scientist, clinical (histocompatibility and immunogenetics). The note was reviewed, but may contain transcription errors or typographical errors.

## 2023-07-17 NOTE — Telephone Encounter (Signed)
URI/Cough      1. Significant trouble breathing (I.e. can not talk in full sentences)?  no      Additional information:  When did your symptoms start? 5 days ago   Have you taken a COVID test? What date?  Wheezing?no  Temperature greater than 100.4 F?no  Nasal congestion? yes  Sinus pressure?yes  Sinus drainage? yes  If yes, what color? clear  Sore throat ? no   Difficulty swallowing? no  Nausea? no  Vomiting? no  Diarrhea? no   If patient was positive for COVID, is the patient requesting an antiviral medication? no

## 2023-07-18 ENCOUNTER — Telehealth: Payer: Self-pay

## 2023-07-18 DIAGNOSIS — J019 Acute sinusitis, unspecified: Secondary | ICD-10-CM

## 2023-07-18 LAB — COVID/INFLUENZA A & B/RSV NAAT (PCR)
COVID-19 NAAT (PCR): NEGATIVE
Influenza A NAAT (PCR): NEGATIVE
Influenza B NAAT (PCR): NEGATIVE
RSV NAAT (PCR): NEGATIVE

## 2023-07-18 MED ORDER — DOXYCYCLINE HYCLATE 100 MG PO TABS *I*
100.0000 mg | ORAL_TABLET | Freq: Two times a day (BID) | ORAL | 0 refills | Status: AC
Start: 2023-07-18 — End: 2023-07-25

## 2023-07-18 NOTE — Telephone Encounter (Signed)
Patient reports that she is feeling the same. No better.  COVID flu and RSV are negative.  Given recent exposure to bacterial sinusitis, will treat with 7 days of doxycycline.  Side effects discussed.  Patient encouraged to call the office if symptoms do not improve with this.    Bayard Hugger, PA

## 2023-07-24 ENCOUNTER — Other Ambulatory Visit: Payer: Self-pay

## 2023-07-26 ENCOUNTER — Encounter: Payer: Self-pay | Admitting: Primary Care

## 2023-07-26 ENCOUNTER — Telehealth: Payer: Self-pay | Admitting: Primary Care

## 2023-07-26 ENCOUNTER — Ambulatory Visit: Payer: Medicare Other | Attending: Primary Care | Admitting: Primary Care

## 2023-07-26 ENCOUNTER — Other Ambulatory Visit: Payer: Self-pay

## 2023-07-26 VITALS — BP 134/78 | HR 66 | Ht 62.99 in | Wt 109.6 lb

## 2023-07-26 DIAGNOSIS — E7800 Pure hypercholesterolemia, unspecified: Secondary | ICD-10-CM | POA: Insufficient documentation

## 2023-07-26 DIAGNOSIS — G40909 Epilepsy, unspecified, not intractable, without status epilepticus: Secondary | ICD-10-CM | POA: Insufficient documentation

## 2023-07-26 DIAGNOSIS — J309 Allergic rhinitis, unspecified: Secondary | ICD-10-CM | POA: Insufficient documentation

## 2023-07-26 DIAGNOSIS — S82891A Other fracture of right lower leg, initial encounter for closed fracture: Secondary | ICD-10-CM | POA: Insufficient documentation

## 2023-07-26 DIAGNOSIS — E034 Atrophy of thyroid (acquired): Secondary | ICD-10-CM | POA: Insufficient documentation

## 2023-07-26 DIAGNOSIS — E559 Vitamin D deficiency, unspecified: Secondary | ICD-10-CM | POA: Insufficient documentation

## 2023-07-26 DIAGNOSIS — J4520 Mild intermittent asthma, uncomplicated: Secondary | ICD-10-CM

## 2023-07-26 MED ORDER — DOXYCYCLINE HYCLATE 100 MG PO TABS *I*
100.0000 mg | ORAL_TABLET | Freq: Two times a day (BID) | ORAL | 0 refills | Status: AC
Start: 2023-07-26 — End: 2023-08-02

## 2023-07-26 MED ORDER — FLUTICASONE PROPIONATE 50 MCG/ACT NA SUSP *I*
1.0000 | Freq: Every day | NASAL | Status: AC
Start: 2023-07-26 — End: ?

## 2023-07-26 MED ORDER — FLUTICASONE PROPIONATE (INHAL) 250 MCG/BLIST IN AEPB *I*
1.0000 | INHALATION_SPRAY | Freq: Two times a day (BID) | RESPIRATORY_TRACT | 1 refills | Status: DC
Start: 2023-07-26 — End: 2024-05-12

## 2023-07-26 NOTE — Progress Notes (Signed)
Reason for Visit: Follow-up (HTN )    Her right ankle is coming along   She is still in PT  The urgent ortho discharge her  She saw Dr. Pierce Crane who also dismissed her   She is wondering when she should have another x-ray    She was doing the exercises with the ankle brace on  She developed a bruise from the rubbing on the brace.  She does have some nonpitting soft tissue swelling above the brace    Sinuses--she was in to see Joni Reining and was started on Doxycycline, generally feeling better, but wants me to check  Feels like she is not fully over it  She is still taking antihistamine, guaifenesin, Sudafed, nasal steroid and nasal antihistamine    Blood pressure was a bit high coming in but recheck was normal    Patient Active Problem List   Diagnosis Code    Reported Trauma Neck     Allergic rhinitis J30.9    Irritable bowel syndrome K58.9    Essential hypertension I10    Dysplastic Nevus I78.1    Rosacea L71.9    Osteoporosis M81.0    Hypothyroidism E03.9    Scoliosis M41.20    Urge Incontinence Of Urine N39.41    A-V Malformation Repair Supratentorial Complex     Seizure disorder G40.909    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    Ringing In The Ears (Tinnitus) H93.19    GERD (gastroesophageal reflux disease) K21.9    Pain in joint, ankle and foot M25.579    Weakness of foot R29.898    Hyperlipidemia E78.5    Right ankle pain M25.571     Current Outpatient Medications   Medication    levothyroxine (SYNTHROID, LEVOTHROID) 25 mcg tablet    generic DME    carBAMazepine (CARBATROL) 100 mg 12 hr capsule    azelastine (ASTEPRO) 137 MCG/SPRAY SOLN nasal solution    econazole nitrate (SPECTAZOLE) 1 % cream    RABEprazole (ACIPHEX) 20 MG tablet    atorvastatin (LIPITOR) 10 mg tablet    generic DME    amLODIPine (NORVASC) 5 mg tablet    carBAMazepine (TEGRETOL XR) 200 mg 12 hr tablet    ketoconazole (NIZORAL) 2 % cream    solifenacin (VESICARE) 10 mg tablet    guaiFENesin (MUCINEX) 600 MG 12 hr tablet    fluticasone  (FLOVENT DISKUS) 250 MCG/ACT diskus inhaler    generic DME    metroNIDAZOLE (METROCREAM) 0.75 % cream    famotidine (PEPCID) 20 mg tablet    pseudoephedrine (SUDAFED) 30 mg tablet    [facility] 12 HOUR NASAL DECONGESTANT 120 MG 12 hr tablet    triamcinolone (KENALOG) 0.1 % ointment    beclomethasone (QNASL) 80 MCG/ACT nasal spray    Non-System Medication    Calcium Carb-Cholecalciferol 600-200 MG-UNIT TABS    Denosumab (PROLIA SC)    loratadine (CLARITIN) 10 mg tablet    Multiple Vitamin (MULTIVITAMIN) per tablet    cholecalciferol (VITAMIN D) 1000 UNITS capsule     No current facility-administered medications for this visit.       Medication list reviewed and updated, no changes were made today    Exam: BP 134/78   Pulse 66   Ht 1.6 m (5' 2.99")   Wt 49.7 kg (109 lb 9.6 oz)   BMI 19.42 kg/m   Well-nourished alert and conversant 67 year old lady in no acute distress.  Tympanic membrane's with slight serous effusion but no erythema or exudate.  Nasal  mucosa is erythematous without frank exudate.  Oral mucosa is moist without exudate.  Lungs are clear to auscultation bilaterally.  No wheezes rales or rhonchi.  Cardiac exam reveals normal S1-S2 with regular rate and rhythm.  Right lower extremity does show some nonpitting edema above the ankle brace with a small area of resolving ecchymosis.  DP and PT pulses are intact  Foot and ankle are nontender to palpation    A/P:  1.  Hypertension-well-controlled on amlodipine, no changes were made today.  2.  Persistent sinusitis-I did renew a prescription for doxycycline for 7 days but told her that she could stop after 3 days if her symptoms have fully resolved.  3.  Right ankle fracture-will put in an x-ray order to be done prior to next visit along with lab work to be done prior to next visit she should continue PT and I would defer to the physical therapist as to when it is safe for her to ambulate without the walker    50 minutes personally spent on the date of the  encounter including face-to-face, pre and postvisit work

## 2023-07-26 NOTE — Telephone Encounter (Signed)
Prior Auth required for medication:     fluticasone (FLOVENT DISKUS) 250 MCG/ACT diskus inhaler  Inhale 1 puff into the lungs 2 times daily.  Disp-60 each, R-1

## 2023-07-27 MED ORDER — ARNUITY ELLIPTA 200 MCG/ACT IN AEPB
1.0000 | INHALATION_SPRAY | Freq: Every day | RESPIRATORY_TRACT | 3 refills | Status: DC
Start: 2023-07-27 — End: 2023-10-25

## 2023-07-27 NOTE — Telephone Encounter (Signed)
The indication for that medication is mild intermittent asthma (which usually gets flared when her allergic rhinitis flares or leads to a sinus infection)   Do I just need to put it through with that diagnosis or will it be denied based on formulary anyhow?

## 2023-07-27 NOTE — Telephone Encounter (Signed)
We can submit the appeal, but if she hasn't tried the preferreds it would still be denied.

## 2023-07-27 NOTE — Telephone Encounter (Signed)
Outcome  Denied on September 5 by [facility] Medicare 2017  Denied. This drug used for ALLERGIC RHINITIS UNSPECIFIED is not an approved use. Medicare Part D rules states the drug must be used for a "medically-accepted indication". A "medically-accepted indication" means a use that is approved by the Food and Drug Administration (FDA), or a use supported by specific resources. These are the [facility] Formulary Service Drug Information and DRUGDEX Information System. Therefore, this drug cannot be covered under your Medicare Part D benefit.

## 2023-07-27 NOTE — Telephone Encounter (Signed)
Patient notified the Flovent Diskus has been denied by her insurance.. Patient states she remembers taking Wixela before if this helps.       Wegmans states: Alternatives are Arnuity Ellipta or Pulmicort Felxhaler.

## 2023-07-27 NOTE — Addendum Note (Signed)
Addended by: Deirdre Priest on: 07/27/2023 12:20 PM     Modules accepted: Orders

## 2023-07-27 NOTE — Telephone Encounter (Signed)
In that case, I will submit for a preferred and be sure that I have the correct diagnosis associated.

## 2023-08-22 ENCOUNTER — Encounter: Payer: Self-pay | Admitting: Gastroenterology

## 2023-08-22 ENCOUNTER — Encounter: Payer: Self-pay | Admitting: Internal Medicine

## 2023-08-22 DIAGNOSIS — F5104 Psychophysiologic insomnia: Secondary | ICD-10-CM

## 2023-08-23 MED ORDER — ZOLPIDEM TARTRATE 5 MG PO TABS: ORAL_TABLET | ORAL | 1 refills | Status: DC

## 2023-08-29 ENCOUNTER — Other Ambulatory Visit: Payer: Self-pay | Admitting: Primary Care

## 2023-08-30 DIAGNOSIS — L720 Epidermal cyst: Secondary | ICD-10-CM | POA: Diagnosis not present

## 2023-09-07 ENCOUNTER — Other Ambulatory Visit (HOSPITAL_BASED_OUTPATIENT_CLINIC_OR_DEPARTMENT_OTHER): Payer: Medicare Other

## 2023-09-10 ENCOUNTER — Other Ambulatory Visit: Payer: Self-pay | Admitting: Primary Care

## 2023-09-11 ENCOUNTER — Ambulatory Visit
Admission: RE | Admit: 2023-09-11 | Discharge: 2023-09-11 | Disposition: A | Discharge status: Home / Self Care | Payer: Medicare Other | Source: Ambulatory Visit

## 2023-09-11 ENCOUNTER — Other Ambulatory Visit: Payer: Self-pay

## 2023-09-11 DIAGNOSIS — S82891A Other fracture of right lower leg, initial encounter for closed fracture: Secondary | ICD-10-CM

## 2023-09-11 DIAGNOSIS — M85871 Other specified disorders of bone density and structure, right ankle and foot: Secondary | ICD-10-CM

## 2023-09-11 DIAGNOSIS — Z8781 Personal history of (healed) traumatic fracture: Secondary | ICD-10-CM

## 2023-09-16 ENCOUNTER — Encounter: Payer: Self-pay | Admitting: Internal Medicine

## 2023-09-16 NOTE — Progress Notes (Unsigned)
Subjective:    Patient ID: Ariel Simpson, female    DOB: 1956/08/08, 67 y.o.   MRN: [id number]      HPI Ariel Simpson is here for a Physical exam and her chronic medical problems.    Doing well.  No major concerns.  Thinks she needs to having her hearing evaluated.    Medications and allergies reviewed with patient and updated if appropriate.  Current Outpatient Medications on File Prior to Visit  Medication Sig Dispense Refill   amoxicillin (AMOXIL) 500 MG capsule TAKE 4 CAPSULES BY MOUTH 1 HOUR BEFORE DENTAL APPOINTMENT 4 capsule 3   aspirin 81 MG tablet Take 1 tablet (81 mg total) by mouth daily.     atorvastatin (LIPITOR) 20 MG tablet Take 1 tablet by mouth daily Annual appt due in Oct must see provider for future refills. 90 tablet 0   BIOTIN PO Place 1 patch onto the skin daily.     Glucosamine Sulfate-MSM (MSM-GLUCOSAMINE EX) Place 1 patch onto the skin daily.     losartan (COZAAR) 25 MG tablet Take 1 tablet (25 mg total) by mouth 2 (two) times daily. 180 tablet 3   metoprolol succinate (TOPROL XL) 25 MG 24 hr tablet Take 1 tablet (25 mg total) by mouth 2 (two) times daily. 180 tablet 3   Omega-3 Fatty Acids (FISH OIL PO) Take 2 capsules by mouth daily.     polyethylene glycol (MIRALAX / GLYCOLAX) packet Take 17 g by mouth daily.     Propylene Glycol, PF, (SYSTANE COMPLETE PF) 0.6 % SOLN Place 1 drop into both eyes as needed (dry eyes).     TURMERIC PO Take 1 tablet by mouth daily.      zolpidem (AMBIEN) 5 MG tablet TAKE 1/2 TABLET(2.5 MG) BY MOUTH AT BEDTIME AS NEEDED FOR SLEEP 45 tablet 1   No current facility-administered medications on file prior to visit.    Review of Systems  Constitutional:  Negative for fever.  HENT:  Positive for hearing loss and tinnitus.   Eyes:  Positive for visual disturbance (occ - ? occular migraine).  Respiratory:  Negative for cough, shortness of breath and wheezing.   Cardiovascular:  Positive for palpitations and leg swelling (mild).  Negative for chest pain.  Gastrointestinal:  Positive for constipation. Negative for abdominal pain, blood in stool and diarrhea.       No gerd  Genitourinary:  Negative for dysuria.  Musculoskeletal:  Positive for arthralgias (generalized - mild). Negative for back pain.  Skin:  Negative for rash.  Neurological:  Negative for light-headedness and headaches.  Psychiatric/Behavioral:  Negative for dysphoric mood. The patient is not nervous/anxious.        Objective:   Vitals:   09/17/23 0819  BP: 136/82  Pulse: 83  Temp: 98.1 F (36.7 C)  SpO2: 93%   Filed Weights   09/17/23 0819  Weight: 171 lb (77.6 kg)   Body mass index is 26.78 kg/m.  BP Readings from Last 3 Encounters:  09/17/23 136/82  06/01/23 130/81  01/18/23 (!) 146/88    Wt Readings from Last 3 Encounters:  09/17/23 171 lb (77.6 kg)  06/01/23 166 lb 6 oz (75.5 kg)  01/18/23 169 lb (76.7 kg)       Physical Exam Constitutional: She appears well-developed and well-nourished. No distress.  HENT:  Head: Normocephalic and atraumatic.  Right Ear: External ear normal. Normal ear canal and TM Left Ear: External ear normal.  Normal ear canal and TM Mouth/Throat:  Oropharynx is clear and moist.  Eyes: Conjunctivae normal.  Neck: Neck supple. No tracheal deviation present. No thyromegaly present.  No carotid bruit  Cardiovascular: Normal rate, regular rhythm and normal heart sounds.   No murmur heard.  No edema. Pulmonary/Chest: Effort normal and breath sounds normal. No respiratory distress. She has no wheezes. She has no rales.  Breast: deferred   Abdominal: Soft. She exhibits no distension. There is no tenderness.  Lymphadenopathy: She has no cervical adenopathy.  Skin: Skin is warm and dry. She is not diaphoretic.  Psychiatric: She has a normal mood and affect. Her behavior is normal.     Lab Results  Component Value Date   WBC 4.8 09/12/2022   HGB 13.9 09/12/2022   HCT 41.0 09/12/2022   PLT 223.0  09/12/2022   GLUCOSE 114 (H) 01/18/2023   CHOL 159 09/12/2022   TRIG 106.0 09/12/2022   HDL 56.50 09/12/2022   LDLCALC 81 09/12/2022   ALT 24 09/12/2022   AST 24 09/12/2022   NA 141 01/18/2023   K 4.7 01/18/2023   CL 103 01/18/2023   CREATININE 0.83 01/18/2023   BUN 16 01/18/2023   CO2 24 01/18/2023   TSH 2.37 09/12/2022   INR 1.29 03/23/2013   HGBA1C 5.5 02/19/2018         Assessment & Plan:   Physical exam: Screening blood work  ordered Exercise  regular - yoga, walking Weight  good Substance abuse  none   Reviewed recommended immunizations.   Health Maintenance  Topic Date Due   Medicare Annual Wellness (AWV)  10/31/2023   Pneumonia Vaccine 40+ Years old (1 of 1 - PCV) 09/16/2024 (Originally 12/04/2018)   COVID-19 Vaccine (5 - 2023-24 season) 10/15/2023   Colonoscopy  08/25/2024   MAMMOGRAM  03/22/2025   DEXA SCAN  03/22/2025   DTaP/Tdap/Td (3 - Td or Tdap) 01/15/2033   INFLUENZA VACCINE  Completed   Hepatitis C Screening  Completed   HPV VACCINES  Aged Out   Zoster Vaccines- Shingrix  Discontinued          See Problem List for Assessment and Plan of chronic medical problems.

## 2023-09-16 NOTE — Patient Instructions (Addendum)
Blood work was ordered.   The lab is on the first floor.    Medications changes include :   none    A referral was ordered [facility] and someone will call you to schedule an appointment.     Return in about 1 year (around 09/16/2024) for Physical Exam.   Health Maintenance, Female Adopting a healthy lifestyle and getting preventive care are important in promoting health and wellness. Ask your health care provider about: The right schedule for you to have regular tests and exams. Things you can do on your own to prevent diseases and keep yourself healthy. What should I know about diet, weight, and exercise? Eat a healthy diet  Eat a diet that includes plenty of vegetables, fruits, low-fat dairy products, and lean protein. Do not eat a lot of foods that are high in solid fats, added sugars, or sodium. Maintain a healthy weight Body mass index (BMI) is used to identify weight problems. It estimates body fat based on height and weight. Your health care provider can help determine your BMI and help you achieve or maintain a healthy weight. Get regular exercise Get regular exercise. This is one of the most important things you can do for your health. Most adults should: Exercise for at least 150 minutes each week. The exercise should increase your heart rate and make you sweat (moderate-intensity exercise). Do strengthening exercises at least twice a week. This is in addition to the moderate-intensity exercise. Spend less time sitting. Even light physical activity can be beneficial. Watch cholesterol and blood lipids Have your blood tested for lipids and cholesterol at 69 years of age, then have this test every 5 years. Have your cholesterol levels checked more often if: Your lipid or cholesterol levels are high. You are older than 69 years of age. You are at high risk for heart disease. What should I know about cancer screening? Depending on your health history and  family history, you may need to have cancer screening at various ages. This may include screening for: Breast cancer. Cervical cancer. Colorectal cancer. Skin cancer. Lung cancer. What should I know about heart disease, diabetes, and high blood pressure? Blood pressure and heart disease High blood pressure causes heart disease and increases the risk of stroke. This is more likely to develop in people who have high blood pressure readings or are overweight. Have your blood pressure checked: Every 3-5 years if you are 8-42 years of age. Every year if you are 47 years old or older. Diabetes Have regular diabetes screenings. This checks your fasting blood sugar level. Have the screening done: Once every three years after age 60 if you are at a normal weight and have a low risk for diabetes. More often and at a younger age if you are overweight or have a high risk for diabetes. What should I know about preventing infection? Hepatitis B If you have a higher risk for hepatitis B, you should be screened for this virus. Talk with your health care provider to find out if you are at risk for hepatitis B infection. Hepatitis C Testing is recommended for: Everyone born from [birth year] through 1967. Anyone with known risk factors for hepatitis C. Sexually transmitted infections (STIs) Get screened for STIs, including gonorrhea and chlamydia, if: You are sexually active and are younger than 69 years of age. You are older than 69 years of age and your health care provider tells you that you are at risk for  this type of infection. Your sexual activity has changed since you were last screened, and you are at increased risk for chlamydia or gonorrhea. Ask your health care provider if you are at risk. Ask your health care provider about whether you are at high risk for HIV. Your health care provider may recommend a prescription medicine to help prevent HIV infection. If you choose to take medicine to prevent HIV,  you should first get tested for HIV. You should then be tested every 3 months for as long as you are taking the medicine. Pregnancy If you are about to stop having your period (premenopausal) and you may become pregnant, seek counseling before you get pregnant. Take 400 to 800 micrograms (mcg) of folic acid every day if you become pregnant. Ask for birth control (contraception) if you want to prevent pregnancy. Osteoporosis and menopause Osteoporosis is a disease in which the bones lose minerals and strength with aging. This can result in bone fractures. If you are 32 years old or older, or if you are at risk for osteoporosis and fractures, ask your health care provider if you should: Be screened for bone loss. Take a calcium or vitamin D supplement to lower your risk of fractures. Be given hormone replacement therapy (HRT) to treat symptoms of menopause. Follow these instructions at home: Alcohol use Do not drink alcohol if: Your health care provider tells you not to drink. You are pregnant, may be pregnant, or are planning to become pregnant. If you drink alcohol: Limit how much you have to: 0-1 drink a day. Know how much alcohol is in your drink. In the [place] one drink equals one 12 oz bottle of beer (355 mL), one 5 oz glass of wine (148 mL), or one 1 oz glass of hard liquor (44 mL). Lifestyle Do not use any products that contain nicotine or tobacco. These products include cigarettes, chewing tobacco, and vaping devices, such as e-cigarettes. If you need help quitting, ask your health care provider. Do not use street drugs. Do not share needles. Ask your health care provider for help if you need support or information about quitting drugs. General instructions Schedule regular health, dental, and eye exams. Stay current with your vaccines. Tell your health care provider if: You often feel depressed. You have ever been abused or do not feel safe at home. Summary Adopting a healthy  lifestyle and getting preventive care are important in promoting health and wellness. Follow your health care provider's instructions about healthy diet, exercising, and getting tested or screened for diseases. Follow your health care provider's instructions on monitoring your cholesterol and blood pressure. This information is not intended to replace advice given to you by your health care provider. Make sure you discuss any questions you have with your health care provider. Document Revised: 03/28/2021 Document Reviewed: 03/28/2021 Elsevier Patient Education  2024 [employer].

## 2023-09-17 ENCOUNTER — Ambulatory Visit (HOSPITAL_COMMUNITY): Payer: Medicare Other | Attending: Cardiology

## 2023-09-17 ENCOUNTER — Ambulatory Visit (INDEPENDENT_AMBULATORY_CARE_PROVIDER_SITE_OTHER): Payer: Medicare Other | Admitting: Internal Medicine

## 2023-09-17 VITALS — BP 136/82 | HR 83 | Temp 98.1°F | Ht 67.0 in | Wt 171.0 lb

## 2023-09-17 DIAGNOSIS — Z0000 Encounter for general adult medical examination without abnormal findings: Secondary | ICD-10-CM | POA: Diagnosis not present

## 2023-09-17 DIAGNOSIS — Z9889 Other specified postprocedural states: Secondary | ICD-10-CM | POA: Diagnosis not present

## 2023-09-17 DIAGNOSIS — G4700 Insomnia, unspecified: Secondary | ICD-10-CM | POA: Diagnosis not present

## 2023-09-17 DIAGNOSIS — I480 Paroxysmal atrial fibrillation: Secondary | ICD-10-CM | POA: Diagnosis not present

## 2023-09-17 DIAGNOSIS — I340 Nonrheumatic mitral (valve) insufficiency: Secondary | ICD-10-CM | POA: Diagnosis not present

## 2023-09-17 DIAGNOSIS — E7849 Other hyperlipidemia: Secondary | ICD-10-CM

## 2023-09-17 DIAGNOSIS — M85851 Other specified disorders of bone density and structure, right thigh: Secondary | ICD-10-CM | POA: Diagnosis not present

## 2023-09-17 DIAGNOSIS — R739 Hyperglycemia, unspecified: Secondary | ICD-10-CM

## 2023-09-17 DIAGNOSIS — H9190 Unspecified hearing loss, unspecified ear: Secondary | ICD-10-CM

## 2023-09-17 DIAGNOSIS — H9319 Tinnitus, unspecified ear: Secondary | ICD-10-CM

## 2023-09-17 LAB — COMPREHENSIVE METABOLIC PANEL
ALT: 29 U/L (ref 0–35)
AST: 24 U/L (ref 0–37)
Albumin: 4.4 g/dL (ref 3.5–5.2)
Alkaline Phosphatase: 81 U/L (ref 39–117)
BUN: 11 mg/dL (ref 6–23)
CO2: 30 meq/L (ref 19–32)
Calcium: 9.7 mg/dL (ref 8.4–10.5)
Chloride: 105 meq/L (ref 96–112)
Creatinine, Ser: 0.85 mg/dL (ref 0.40–1.20)
GFR: 69.73 mL/min (ref >60.00)
Glucose, Bld: 96 mg/dL (ref 70–99)
Potassium: 4.9 meq/L (ref 3.5–5.1)
Sodium: 142 meq/L (ref 135–145)
Total Bilirubin: 0.8 mg/dL (ref 0.2–1.2)
Total Protein: 6.7 g/dL (ref 6.0–8.3)

## 2023-09-17 LAB — CUP PACEART REMOTE DEVICE CHECK
Battery Remaining Longevity: 157 mo
Battery Voltage: 3.08 V
Brady Statistic AP VP Percent: 0.03 %
Brady Statistic AP VS Percent: 87.24 %
Brady Statistic AS VP Percent: 0.01 %
Brady Statistic AS VS Percent: 12.72 %
Brady Statistic RA Percent Paced: 87.46 %
Brady Statistic RV Percent Paced: 0.03 %
Date Time Interrogation Session: 20241026200538
Implantable Lead Connection Status: 753985
Implantable Lead Connection Status: 753985
Implantable Lead Implant Date: 20231101
Implantable Lead Implant Date: 20231101
Implantable Lead Location: 753859
Implantable Lead Location: 753860
Implantable Lead Model: 3830
Implantable Lead Model: 5076
Implantable Pulse Generator Implant Date: 20231101
Lead Channel Impedance Value: 323 Ohm
Lead Channel Impedance Value: 361 Ohm
Lead Channel Impedance Value: 513 Ohm
Lead Channel Impedance Value: 532 Ohm
Lead Channel Pacing Threshold Amplitude: 0.875 V
Lead Channel Pacing Threshold Amplitude: 0.875 V
Lead Channel Pacing Threshold Pulse Width: 0.4 ms
Lead Channel Pacing Threshold Pulse Width: 0.4 ms
Lead Channel Sensing Intrinsic Amplitude: 17 mV
Lead Channel Sensing Intrinsic Amplitude: 17 mV
Lead Channel Sensing Intrinsic Amplitude: 2.5 mV
Lead Channel Sensing Intrinsic Amplitude: 2.5 mV
Lead Channel Setting Pacing Amplitude: 1.5 V
Lead Channel Setting Pacing Amplitude: 2 V
Lead Channel Setting Pacing Pulse Width: 0.4 ms
Lead Channel Setting Sensing Sensitivity: 1.2 mV
Zone Setting Status: 755011

## 2023-09-17 LAB — CBC WITH DIFFERENTIAL/PLATELET
Basophils Absolute: 0 10*3/uL (ref 0.0–0.1)
Basophils Relative: 1.1 % (ref 0.0–3.0)
Eosinophils Absolute: 0.1 10*3/uL (ref 0.0–0.7)
Eosinophils Relative: 3.5 % (ref 0.0–5.0)
HCT: 39.1 % (ref 36.0–46.0)
Hemoglobin: 13.1 g/dL (ref 12.0–15.0)
Lymphocytes Relative: 32.4 % (ref 12.0–46.0)
Lymphs Abs: 1.4 10*3/uL (ref 0.7–4.0)
MCHC: 33.5 g/dL (ref 30.0–36.0)
MCV: 91.6 fL (ref 78.0–100.0)
Monocytes Absolute: 0.3 10*3/uL (ref 0.1–1.0)
Monocytes Relative: 8 % (ref 3.0–12.0)
Neutro Abs: 2.3 10*3/uL (ref 1.4–7.7)
Neutrophils Relative %: 55 % (ref 43.0–77.0)
Platelets: 195 10*3/uL (ref 150.0–400.0)
RBC: 4.27 Mil/uL (ref 3.87–5.11)
RDW: 13.2 % (ref 11.5–15.5)
WBC: 4.2 10*3/uL (ref 4.0–10.5)

## 2023-09-17 LAB — LIPID PANEL
Cholesterol: 174 mg/dL (ref 0–200)
HDL: 57.7 mg/dL (ref >39.00)
LDL Cholesterol: 88 mg/dL (ref 0–99)
NonHDL: 116.7
Total CHOL/HDL Ratio: 3
Triglycerides: 146 mg/dL (ref 0.0–149.0)
VLDL: 29.2 mg/dL (ref 0.0–40.0)

## 2023-09-17 LAB — ECHOCARDIOGRAM COMPLETE
Area-P 1/2: 2.87 cm2
Height: 67 in
MV M vel: 6.42 m/s
MV Peak grad: 164.9 mm[Hg]
MV VTI: 2.04 cm2
P 1/2 time: 618 ms
S' Lateral: 3.5 cm
Weight: 2736 [oz_av]

## 2023-09-17 LAB — TSH: TSH: 2.46 u[IU]/mL (ref 0.35–5.50)

## 2023-09-17 LAB — HEMOGLOBIN A1C: Hgb A1c MFr Bld: 5.6 % (ref 4.6–6.5)

## 2023-09-17 NOTE — Assessment & Plan Note (Addendum)
Chronic DEXA up-to-date-osteopenia-low FRAX Regular exercise stressed Continue calcium and vitamin D daily

## 2023-09-17 NOTE — Assessment & Plan Note (Signed)
Chronic °Regular exercise and healthy diet encouraged °Check lipid panel  °Continue atorvastatin 20 mg daily °

## 2023-09-17 NOTE — Assessment & Plan Note (Signed)
Chronic Lab Results  Component Value Date   HGBA1C 5.5 02/19/2018   Check A1c Continue regular exercise Low sugar/carbohydrate diet

## 2023-09-17 NOTE — Assessment & Plan Note (Signed)
Chronic Controlled, Stable Continue Ambien 2.5 mg nightly as needed

## 2023-09-17 NOTE — Assessment & Plan Note (Signed)
Chronic Following with cardiology On metoprolol XL 25 mg twice daily, aspirin 81 mg daily CBC, CMP, TSH

## 2023-09-18 DIAGNOSIS — H2513 Age-related nuclear cataract, bilateral: Secondary | ICD-10-CM | POA: Diagnosis not present

## 2023-09-18 DIAGNOSIS — H5213 Myopia, bilateral: Secondary | ICD-10-CM | POA: Diagnosis not present

## 2023-09-19 ENCOUNTER — Ambulatory Visit (INDEPENDENT_AMBULATORY_CARE_PROVIDER_SITE_OTHER): Payer: Medicare Other

## 2023-09-19 DIAGNOSIS — I495 Sick sinus syndrome: Secondary | ICD-10-CM | POA: Diagnosis not present

## 2023-09-24 ENCOUNTER — Encounter: Payer: Self-pay | Admitting: Cardiovascular Disease

## 2023-09-24 ENCOUNTER — Ambulatory Visit: Payer: Medicare Other | Attending: Cardiovascular Disease | Admitting: Cardiovascular Disease

## 2023-09-24 VITALS — BP 157/91 | HR 61 | Ht 66.0 in | Wt 169.0 lb

## 2023-09-24 DIAGNOSIS — Z79899 Other long term (current) drug therapy: Secondary | ICD-10-CM | POA: Diagnosis not present

## 2023-09-24 DIAGNOSIS — I340 Nonrheumatic mitral (valve) insufficiency: Secondary | ICD-10-CM

## 2023-09-24 DIAGNOSIS — E785 Hyperlipidemia, unspecified: Secondary | ICD-10-CM

## 2023-09-24 DIAGNOSIS — I495 Sick sinus syndrome: Secondary | ICD-10-CM | POA: Diagnosis not present

## 2023-09-24 DIAGNOSIS — I10 Essential (primary) hypertension: Secondary | ICD-10-CM | POA: Diagnosis not present

## 2023-09-24 DIAGNOSIS — Z9889 Other specified postprocedural states: Secondary | ICD-10-CM

## 2023-09-24 DIAGNOSIS — I480 Paroxysmal atrial fibrillation: Secondary | ICD-10-CM | POA: Diagnosis not present

## 2023-09-24 MED ORDER — LOSARTAN POTASSIUM 50 MG PO TABS: 50.0000 mg | ORAL_TABLET | Freq: Two times a day (BID) | ORAL | 3 refills | Status: DC

## 2023-09-24 NOTE — Patient Instructions (Signed)
Medication Instructions:  Increase:  Losartan (Cozaar) to 50 mg Twice Daily. *If you need a refill on your cardiac medications before your next appointment, please call your pharmacy*   Lab Work: None  Testing/Procedures: Your physician has requested that you have an echocardiogram in about six months. Echocardiography is a painless test that uses sound waves to create images of your heart. It provides your doctor with information about the size and shape of your heart and how well your heart's chambers and valves are working. This procedure takes approximately one hour. There are no restrictions for this procedure. Please do NOT wear cologne, perfume, aftershave, or lotions (deodorant is allowed). Please arrive 15 minutes prior to your appointment time. This will take place at [address] [place]    Follow-Up: At [facility], you and your health needs are our priority.  As part of our continuing mission to provide you with exceptional heart care, we have created designated Provider Care Teams.  These Care Teams include your primary Cardiologist (physician) and Advanced Practice Providers (APPs -  Physician Assistants and Nurse Practitioners) who all work together to provide you with the care you need, when you need it.  Your next appointment:   12 month(s)  Provider:   Thurmon Fair, MD    Other Instructions: Please check your blood pressure once a day, after you have been on the BP medication changes for at least one week. Send in your BP log thru MyChart for Dr. Salena Saner to review.  HOW TO TAKE YOUR BLOOD PRESSURE: Rest 10-15 minutes before taking your blood pressure. Don't smoke or drink caffeinated beverages for at least 30 minutes before. Take your blood pressure before (not after) you eat. Sit comfortably with your back supported and both feet on the floor (don't cross your legs). Elevate your arm to heart level on a table or a desk. Use the proper sized cuff. It  should fit smoothly and snugly around your bare upper arm. There should be enough room to slip a fingertip under the cuff. The bottom edge of the cuff should be 1 inch above the crease of the elbow.

## 2023-09-24 NOTE — Progress Notes (Signed)
Cardiology Office Note:  .   Date:  09/28/2023  ID:  Ariel Simpson, DOB [birth year], MRN [id number] PCP: Pincus Sanes, MD  [facility] Providers Cardiologist:  Thurmon Fair, MD    History of Present Illness: .   Ariel Simpson is a 67 y.o. female who is a longtime patient of Dr. Verdis Prime.  She is here to establish new cardiology care.  She also sees Dr. Lewayne Bunting for arrhythmia and pacemaker follow-up.  She has a longstanding history of valvular heart disease mitral valve prolapse and mitral insufficiency and underwent mitral valve repair in 2014 (triangular resection of posterior leaflet, neocord x 4, 30 mm Sorin memo 3D ring annuloplasty; simultaneous surgical maze and oversewing of the left atrial appendage via right minithoracotomy)..  The follow-up echocardiogram which was performed 09/17/2023 shows recurrent severe mitral insufficiency with an eccentric jet.  The mean gradient is 5 mmHg.  Left ventricle remains normal in size and systolic function (end-systolic diameter 3.5 cm, EF 55 to 60%) the left atrium is mild-moderately dilated and the pulmonary artery pressure was normal (29 mmHg).  She had atrial fibrillation prior to mitral valve repair but has not had any recurrence following her surgery and Maze procedure.  She has a history of syncopal event. Implanted loop recorder showed a 4-second daytime pause so she subsequently received a dual-chamber permanent pacemaker ([employer]tr[employer] Azure, followed by Dr. Ladona Ridgel).  Has treated hypertension and hypercholesterolemia (losartan monotherapy, atorvastatin).  She reports that at home her blood pressure is consistently high around 150/90.  She is asymptomatic.  She enjoys gardening and yoga.  Does not have exertional dyspnea, orthopnea, PND, extremity edema, chest pain at rest or with activity, palpitations, dizziness or syncope.  She denies any focal neurological complaints.   ROS: As above  Studies Reviewed: Marland Kitchen   EKG  Interpretation Date/Time:  Monday September 24 2023 10:24:32 EST Ventricular Rate:  61 PR Interval:  196 QRS Duration:  88 QT Interval:  456 QTC Calculation: 459 R Axis:   26  Text Interpretation: Atrial-paced rhythm When compared with ECG of 20-Sep-2022 15:12, PVCs are no lomger seen Confirmed by Deshon Hsiao ([id number]) on 09/24/2023 10:39:46 AM     Risk Assessment/Calculations:     Repeat check BP was still high.  Medication adjusted today.     Physical Exam:   VS:  BP (!) 157/91 (BP Location: Left Arm, Patient Position: Sitting, Cuff Size: Normal)   Pulse 61   Ht 5\' 6"  (1.676 m)   Wt 169 lb (76.7 kg)   SpO2 96%   BMI 27.28 kg/m    Wt Readings from Last 3 Encounters:  09/24/23 169 lb (76.7 kg)  09/17/23 171 lb (77.6 kg)  06/01/23 166 lb 6 oz (75.5 kg)    GEN: Well nourished, well developed in no acute distress NECK: No JVD; No carotid bruits CARDIAC: RRR, normal S1 and S2, 3/6 holosystolic murmur radiating broadly from the apex to the right upper sternal border, no diastolic murmurs, rubs, gallops RESPIRATORY:  Clear to auscultation without rales, wheezing or rhonchi  ABDOMEN: Soft, non-tender, non-distended EXTREMITIES:  No edema; No deformity   ASSESSMENT AND PLAN: .   MR: She once again has severe mitral regurgitation, but remains asymptomatic and so far there is no evidence of LV dysfunction or excessive dilation.  Will monitor with echocardiography and she should promptly report any exertional dyspnea.  The threshold for recommended redo mitral valve repair is high, but it is likely she  could be a candidate for MitraClip if this becomes indicated. PAFib: Had a Maze procedure at the time of surgery with excellent long-term results.  Very low burden of arrhythmia, none seen since pacemaker implantation.  On aspirin, not on anticoagulants. SSS: History of syncope with a 4-second pause.  Has almost 85% atrial pacing, good heart rate histogram distribution.  Does not require  ventricular pacing. Pacemaker: Monitored by Dr. Ladona Ridgel. HLP: Good lipid profile on current medication.  Does not have known CAD or PAD. HTN: Increase losartan to 50 mg daily.       Dispo: Increase losartan to 50 mg daily.  Recheck echocardiogram in 6 months.  Signed, Thurmon Fair, MD

## 2023-09-27 ENCOUNTER — Other Ambulatory Visit: Payer: Self-pay

## 2023-09-27 ENCOUNTER — Ambulatory Visit: Payer: Medicare Other | Attending: Primary Care

## 2023-09-27 DIAGNOSIS — Z23 Encounter for immunization: Secondary | ICD-10-CM | POA: Insufficient documentation

## 2023-09-27 NOTE — Progress Notes (Signed)
Patient given High dose infuenza IM in the left deltoid.Patient tolerated well. Went over possible side effects, VIS provided        Current Immunizations       Name Date Dose VIS Date Route Exp Date    Fluad trivalent >64y 09/27/2023  5:24 PM (67 y.o.) 0.5 mL 06/25/2020 Intramuscular 04/08/2024    Site: Left deltoid     Given By: Lynann Beaver, LPN     Manufacturer: [employer]     Lot: [id number]     NDC: [phone]

## 2023-10-01 NOTE — Progress Notes (Unsigned)
Assessment/Plan:   1.  Essential Tremor.  -on low dose metoprolol and doing well  2.  History of recurrent syncope, now known to be due to sinus pauses  -Syncope captured on loop recorder and patient now with permanent pacemaker  -Following with cardiology.   3.  PAF, per cardiology notes  -Currently not on anticoagulants.  If she gets placed on DOAC's, she will not be eligible for primidone due to interaction.  4.  Migraine without headache  -This does not sound like a TIA to me.  There is nothing focal or lateralizing about the event.  Sounds more like a migrainous phenomenon, esp given that she has had 2 prior episodes in the past  -Patient is already on baby aspirin.  -she had an echo 09/17/23.  She has a normal left ventricular ejection fraction.  There is severe mitral regurgitation that has gotten worse since prior visit and cardiology plans to discuss with her.  This would not be responsible for above symptoms.  -I will go ahead and check carotid ultrasound for completeness sake, but again a unilateral carotid stenosis would not cause above symptoms.  We would not do anything with this unless stenosis is greater than 80%.  She agrees  -Patient is already on atorvastatin.  Her LDL was 88 in October, 2024.  5 Subjective:   Ariel Simpson was seen in consultation in follow-up.  I last saw her close to a year ago.  I have seen her several times over the years for tremor.  She is sent today for a different reason.  She was sent for a possible TIA.  The event occurred in July.  TIA symptoms were described as vision going blurry in both eyes.  She states that it was like looking through a prism.  She was on the toilet when it happened but she doesn't remember if she had a BM.  No diplopia.  No HA.   It lasted 2 minutes.  There is no associated lateralizing weakness or paresthesias.  No speech change.  No vertigo.  She has had 2 previous events that were the same.  They were a few months  after her open heart surgery and she was told that they were "optical migraines."  Patient was taking her daily aspirin.  She saw the nurse practitioner July 12 and she thought this represented a TIA.  She saw her cardiologist less than a week later and he just noted "transient blurry vision.  Possible TIA.  On aspirin 81 mg daily."  She saw her eye doctor and he didn't think that this was a TIA and thought it was migraine without aura.  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    Current/Previously tried tremor medications: metoprolol  Current medications that may exacerbate tremor:  n/a  Outside reports reviewed: lab reports, office notes, and referral letter/letters.  No Known Allergies  Current Meds  Medication Sig   aspirin 81 MG tablet Take 1 tablet (81 mg total) by mouth daily.   atorvastatin (LIPITOR) 20 MG tablet Take 1 tablet by mouth daily Annual appt due in Oct must see provider for future refills.   BIOTIN PO Place 1 patch onto the skin daily.   Glucosamine Sulfate-MSM (MSM-GLUCOSAMINE EX) Place 1 patch onto the skin daily.   losartan (COZAAR) 50 MG tablet Take 1 tablet (50 mg total) by mouth  2 (two) times daily.   metoprolol succinate (TOPROL XL) 25 MG 24 hr tablet Take 1 tablet (25 mg total) by mouth 2 (two) times daily.   Omega-3 Fatty Acids (FISH OIL PO) Take 2 capsules by mouth daily.   polyethylene glycol (MIRALAX / GLYCOLAX) packet Take 17 g by mouth daily.   Propylene Glycol, PF, (SYSTANE COMPLETE PF) 0.6 % SOLN Place 1 drop into both eyes as needed (dry eyes).   TURMERIC PO Take 1 tablet by mouth daily.    zolpidem (AMBIEN) 5 MG tablet TAKE 1/2 TABLET(2.5 MG) BY MOUTH AT BEDTIME AS NEEDED FOR SLEEP    Objective:   VITALS:   Vitals:   10/04/23 0822  BP: 126/72  Pulse: 71  SpO2: 98%  Weight: 168 lb 9.6 oz (76.5 kg)  Height: 5\' 6"  (1.676 m)    GEN:  The  patient appears stated age and is in NAD. HEENT:  Normocephalic, atraumatic.  The mucous membranes are moist. The superficial temporal arteries are without ropiness or tenderness. CV:  RRR Lungs:  CTAB Neck/HEME:  There are no carotid bruits bilaterally.   Neurological examination:   Orientation: The patient is alert and oriented x3.  Cranial nerves: There is good facial symmetry.  Extraocular muscles are intact. The visual fields are full to confrontational testing. The speech is fluent and clear. Soft palate rises symmetrically and there is no tongue deviation. Hearing is intact to conversational tone. Sensation: Sensation is intact to light touch throughout Motor: Strength is 5/5 in the bilateral upper and lower extremities.   Shoulder shrug is equal and symmetric.  There is no pronator drift.  No extinction with DSS   Movement examination: Tone: There is normal tone in the bilateral upper extremities.  The tone in the lower extremities is normal.  Abnormal movements: There is no tremor today Coordination:  There is no decremation with RAM's, with any form of RAMS, including alternating supination and pronation of the forearm, hand opening and closing, finger taps, heel taps and toe taps Gait and Station: The patient ambulates well.  Min trouble in a tandem fashion due to prior ankle issues on the right  I have reviewed and interpreted the following labs independently   Chemistry      Component Value Date/Time   NA 142 09/17/2023 0858   NA 141 01/18/2023 1521   K 4.9 09/17/2023 0858   CL 105 09/17/2023 0858   CO2 30 09/17/2023 0858   BUN 11 09/17/2023 0858   BUN 16 01/18/2023 1521   CREATININE 0.85 09/17/2023 0858   CREATININE 0.96 10/10/2013 1606      Component Value Date/Time   CALCIUM 9.7 09/17/2023 0858   ALKPHOS 81 09/17/2023 0858   AST 24 09/17/2023 0858   ALT 29 09/17/2023 0858   BILITOT 0.8 09/17/2023 0858   BILITOT 0.6 08/25/2019 1134      Lab Results  Component  Value Date   WBC 4.2 09/17/2023   HGB 13.1 09/17/2023   HCT 39.1 09/17/2023   MCV 91.6 09/17/2023   PLT 195.0 09/17/2023   Lab Results  Component Value Date   TSH 2.46 09/17/2023   Total time spent on today's visit was 30 minutes, including both face-to-face time and nonface-to-face time.  Time included that spent on review of records (prior notes available to me/labs/imaging if pertinent), discussing treatment and goals, answering patient's questions and coordinating care.     CC:  Pincus Sanes, MD

## 2023-10-02 ENCOUNTER — Other Ambulatory Visit: Payer: Self-pay | Admitting: Primary Care

## 2023-10-04 ENCOUNTER — Ambulatory Visit: Payer: Medicare Other | Admitting: Neurology

## 2023-10-04 ENCOUNTER — Encounter: Payer: Self-pay | Admitting: Neurology

## 2023-10-04 VITALS — BP 126/72 | HR 71 | Ht 66.0 in | Wt 168.6 lb

## 2023-10-04 DIAGNOSIS — I340 Nonrheumatic mitral (valve) insufficiency: Secondary | ICD-10-CM | POA: Diagnosis not present

## 2023-10-04 DIAGNOSIS — R29818 Other symptoms and signs involving the nervous system: Secondary | ICD-10-CM | POA: Diagnosis not present

## 2023-10-05 ENCOUNTER — Other Ambulatory Visit: Payer: Self-pay | Admitting: Primary Care

## 2023-10-05 MED ORDER — TRIAMCINOLONE ACETONIDE 0.1 % EX OINT *I*
TOPICAL_OINTMENT | Freq: Two times a day (BID) | CUTANEOUS | 0 refills | Status: AC
Start: 2023-10-05 — End: ?

## 2023-10-08 NOTE — Progress Notes (Signed)
Remote pacemaker transmission.   

## 2023-10-15 ENCOUNTER — Other Ambulatory Visit: Payer: Self-pay | Admitting: Primary Care

## 2023-10-24 ENCOUNTER — Other Ambulatory Visit
Admission: RE | Admit: 2023-10-24 | Discharge: 2023-10-24 | Disposition: A | Discharge status: Home / Self Care | Payer: Medicare Other | Source: Ambulatory Visit | Attending: Primary Care | Admitting: Primary Care

## 2023-10-24 ENCOUNTER — Other Ambulatory Visit: Payer: Self-pay

## 2023-10-24 DIAGNOSIS — E559 Vitamin D deficiency, unspecified: Secondary | ICD-10-CM | POA: Insufficient documentation

## 2023-10-24 DIAGNOSIS — G40909 Epilepsy, unspecified, not intractable, without status epilepticus: Secondary | ICD-10-CM | POA: Insufficient documentation

## 2023-10-24 DIAGNOSIS — E7800 Pure hypercholesterolemia, unspecified: Secondary | ICD-10-CM | POA: Insufficient documentation

## 2023-10-24 DIAGNOSIS — K219 Gastro-esophageal reflux disease without esophagitis: Secondary | ICD-10-CM | POA: Insufficient documentation

## 2023-10-24 DIAGNOSIS — E034 Atrophy of thyroid (acquired): Secondary | ICD-10-CM | POA: Insufficient documentation

## 2023-10-24 LAB — LIPID PANEL
Chol/HDL Ratio: 2.1
Cholesterol: 227 mg/dL — AB
HDL: 109 mg/dL — ABNORMAL HIGH (ref 40–60)
LDL Calculated: 101 mg/dL
Non HDL Cholesterol: 118 mg/dL
Triglycerides: 85 mg/dL

## 2023-10-24 LAB — CBC
Hematocrit: 42 % (ref 34–49)
Hemoglobin: 13.2 g/dL (ref 11.2–16.0)
MCV: 104 fL — ABNORMAL HIGH (ref 75–100)
Platelets: 237 10*3/uL (ref 150–450)
RBC: 4 MIL/uL (ref 4.0–5.5)
RDW: 13.2 % (ref 0.0–15.0)
WBC: 4.6 10*3/uL (ref 3.5–11.0)

## 2023-10-24 LAB — COMPREHENSIVE METABOLIC PANEL
ALT: 36 U/L — ABNORMAL HIGH (ref 0–35)
AST: 27 U/L (ref 0–35)
Albumin: 4.9 g/dL (ref 3.5–5.2)
Alk Phos: 62 U/L (ref 35–105)
Anion Gap: 10 (ref 7–16)
Bilirubin,Total: 0.3 mg/dL (ref 0.0–1.2)
CO2: 30 mmol/L — ABNORMAL HIGH (ref 20–28)
Calcium: 9.4 mg/dL (ref 8.6–10.2)
Chloride: 98 mmol/L (ref 96–108)
Creatinine: 0.57 mg/dL (ref 0.51–0.95)
Glucose: 90 mg/dL (ref 60–99)
Lab: 18 mg/dL (ref 6–20)
Potassium: 5.2 mmol/L — ABNORMAL HIGH (ref 3.3–5.1)
Sodium: 138 mmol/L (ref 133–145)
Total Protein: 6.9 g/dL (ref 6.3–7.7)
eGFR BY CREAT: 98 *

## 2023-10-24 LAB — TSH: TSH: 1.17 u[IU]/mL (ref 0.27–4.20)

## 2023-10-24 LAB — VITAMIN D: 25-OH Vit Total: 40 ng/mL (ref 30–60)

## 2023-10-25 ENCOUNTER — Encounter: Payer: Self-pay | Admitting: Primary Care

## 2023-10-25 ENCOUNTER — Ambulatory Visit: Payer: Medicare Other | Attending: Primary Care | Admitting: Primary Care

## 2023-10-25 ENCOUNTER — Other Ambulatory Visit: Payer: Self-pay

## 2023-10-25 ENCOUNTER — Ambulatory Visit: Payer: Medicare Other | Attending: Internal Medicine | Admitting: Audiology

## 2023-10-25 ENCOUNTER — Other Ambulatory Visit: Payer: Self-pay | Admitting: Internal Medicine

## 2023-10-25 VITALS — BP 122/78 | HR 78 | Ht 62.99 in | Wt 112.2 lb

## 2023-10-25 DIAGNOSIS — H903 Sensorineural hearing loss, bilateral: Secondary | ICD-10-CM | POA: Diagnosis not present

## 2023-10-25 DIAGNOSIS — H9313 Tinnitus, bilateral: Secondary | ICD-10-CM

## 2023-10-25 DIAGNOSIS — E034 Atrophy of thyroid (acquired): Secondary | ICD-10-CM | POA: Insufficient documentation

## 2023-10-25 DIAGNOSIS — E559 Vitamin D deficiency, unspecified: Secondary | ICD-10-CM | POA: Insufficient documentation

## 2023-10-25 DIAGNOSIS — G40909 Epilepsy, unspecified, not intractable, without status epilepticus: Secondary | ICD-10-CM | POA: Insufficient documentation

## 2023-10-25 DIAGNOSIS — Z0000 Encounter for general adult medical examination without abnormal findings: Secondary | ICD-10-CM | POA: Insufficient documentation

## 2023-10-25 DIAGNOSIS — E875 Hyperkalemia: Secondary | ICD-10-CM | POA: Insufficient documentation

## 2023-10-25 DIAGNOSIS — E7800 Pure hypercholesterolemia, unspecified: Secondary | ICD-10-CM | POA: Insufficient documentation

## 2023-10-25 DIAGNOSIS — Z1211 Encounter for screening for malignant neoplasm of colon: Secondary | ICD-10-CM | POA: Insufficient documentation

## 2023-10-25 NOTE — Patient Instructions (Signed)
Thank you for completing your Subsequent Annual Medicare Visit   with [place] today.     The purpose of this visits was to:    Screen for disease  Assess risk of future medical problems  Help develop a healthy lifestyle  Update vaccines  Get to know your doctor in case of an illness    Patient Care Team:  Deeann Saint (Inactive) as PCP - CCP-AUDIOLOGY  Deirdre Priest, MD as PCP - General  Reuben Likes Chrystie Nose, AUD as PCP - CCP-AUDIOLOGY  Audiology, Sherilyn Dacosta, MD (Orthopedic Surgery)     Medicare 5 Year Plan    The following items were identified as areas of concern during your screening today:  High Blood Pressure (Hypertension) - This is a risk factor for Heart Attack, Stroke, Kidney Problems and Eye Problems.       The Health Maintenance table below identifies screening tests and immunizations recommended by your health care team:  Health Maintenance: These screening recommendations are based on USPSTF, [employer], and [place] state guidelines   Topic Date Due   . Pneumococcal Vaccination (1 of 2 - PCV) Never done   . Shingles Vaccine (2 of 3) 03/27/2014   . Colon Cancer Screening  07/12/2022   . HIV Screening  08/08/2040 (Originally 12/04/1968)   . Breast Cancer Screening  01/26/2024   . Cervical Cancer Screening  05/18/2024   . Osteoporosis Screening  07/25/2024   . Depression - Yearly  10/24/2024   . Fall Risk Screening  10/24/2024   . DTaP/Tdap/Td Vaccines (4 - Td or Tdap) 12/16/2028   . Flu Shot  Completed   . Hepatitis C Screening  Completed   . COVID-19 Vaccine  Completed   . Hepatitis B Vaccine  Aged Out   . HIB Vaccine  Aged Out   . HPV Vaccine  Aged Out   . Meningococcal Vaccine  Aged Out   . Rotavirus Vaccine  Aged Out     In addition, goals and orders placed to address these recommendations are listed in the "Today's Visit" section.    We wish you the best of health and look forward to seeing you again next year for your Annual Medicare Wellness Visit.     If you have any health  care concerns before then, please do not hesitate to contact us.
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
## 2023-10-25 NOTE — Procedures (Signed)
  Outpatient Audiology and [facility] [address] [place], [place]  [address] [phone]  AUDIOLOGICAL  EVALUATION  NAME: Ariel Simpson     DOB:   11/26/1955      MRN: [id number]                                                                                     DATE: 10/25/2023     REFERENT: Pincus Sanes, MD STATUS: Outpatient DIAGNOSIS: Sensorineural hearing loss, tinnitus  History: [name] was seen for an audiological evaluation due to decreased hearing and tinnitus.  [name] reports a history of decreased hearing and reports she feels she does not hear as well as she used to.  Lane reports a history of tinnitus.  She reports her tinnitus is constant and she is currently utilizing tinnitus management strategies.  [name] reports her tinnitus is high-pitched.  [name] reports a history of hearing difficulty in the presence of background noise.  She reports difficulty localizing sounds.  [name] reports a history of a sensation of her ear is closing off and a high-pitched whistle occurring around 1 time a week.  [name] denies vertigo, aural fullness, otalgia.  Evaluation:  Otoscopy showed a clear view of the tympanic membranes, bilaterally Tympanometry results were consistent with normal middle ear pressure and normal tympanic membrane mobility (Type A), bilaterally Audiometric testing was completed using Conventional Audiometry techniques with insert earphones and TDH headphones. Test results are consistent with normal hearing sensitivity at 250 to 4000 Hz sloping to a mild sensorineural hearing loss at 6000 to 8000 Hz, bilaterally. Speech Recognition Thresholds were obtained at 15 dB HL in the right ear and at 15 dB HL in the left ear. Word Recognition Testing was completed at 60 dB HL and Kendallyn scored 100% bilaterally  Results:  The test results were reviewed with [name]. Today's test results are consistent with normal hearing sensitivity at 250 to 4000 Hz sloping to a mild  sensorineural hearing loss at 6000 to 8000 Hz, bilaterally.  May have hearing and communication difficulty in adverse listening environments.  She will benefit from the use of good communication strategies.  Tinnitus and tinnitus management strategies were reviewed with [name].  [name] was given a copy of her audiogram and a handout on tinnitus.  Recommendations: 1.   Use of tinnitus management strategies 2.   Monitor hearing sensitivity.  Return in 2 to 3 years for an audiological evaluation.   30 minutes spent testing and counseling on results.   If you have any questions please feel free to contact me at [phone].  The audiogram can be found under the media file.  [name] Audiologist, Au.D., CCC-A 10/25/2023  3:14 PM  Cc: Pincus Sanes, MD

## 2023-10-26 ENCOUNTER — Ambulatory Visit (INDEPENDENT_AMBULATORY_CARE_PROVIDER_SITE_OTHER): Payer: Medicare Other

## 2023-10-26 VITALS — Ht 66.0 in | Wt 168.0 lb

## 2023-10-26 DIAGNOSIS — Z0000 Encounter for general adult medical examination without abnormal findings: Secondary | ICD-10-CM | POA: Diagnosis not present

## 2023-10-26 LAB — CARBAMAZEPINE, FREE & TOTAL
Carbamazepine, Free: 21.8 *Unknown (ref 8.0–35.0)
Carbamazepine,Free: 2.2 ug/mL (ref 1.0–3.0)
Carbamazepine,Total: 10.1 ug/mL (ref 4.0–12.0)

## 2023-10-26 NOTE — Progress Notes (Signed)
Subjective:   Ariel Simpson is a 67 y.o. female who presents for Medicare Annual (Subsequent) preventive examination.  Visit Complete: Virtual I connected with  Ardath Sax on 10/26/23 by a audio enabled telemedicine application and verified that I am speaking with the correct person using two identifiers.  Patient Location: Home  Provider Location: Office/Clinic  I discussed the limitations of evaluation and management by telemedicine. The patient expressed understanding and agreed to proceed.  Vital Signs: Because this visit was a virtual/telehealth visit, some criteria may be missing or patient reported. Any vitals not documented were not able to be obtained and vitals that have been documented are patient reported.  Patient Medicare AWV questionnaire was completed by the patient on 12/04/20204; I have confirmed that all information answered by patient is correct and no changes since this date.  Cardiac Risk Factors include: advanced age (>47men, >73 women);Other (see comment);dyslipidemia, Risk factor comments: TIA, A-Fib, SVT     Objective:    Today's Vitals   10/26/23 1258  Weight: 168 lb (76.2 kg)  Height: 5\' 6"  (1.676 m)   Body mass index is 27.12 kg/m.     10/26/2023    1:13 PM 10/04/2023    8:22 AM 10/30/2022   10:14 AM 09/20/2022    7:02 AM 10/28/2021    1:12 PM 09/14/2020    8:46 AM 01/16/2020    8:22 AM  Advanced Directives  Does Patient Have a Medical Advance Directive? Yes Yes Yes Yes Yes Yes Yes  Type of Estate agent of [place];Living will Living will Healthcare Power of [place];Living will Healthcare Power of [place];Living will Living will;Healthcare Power of Attorney Living will;Healthcare Power of [employer] Power of [place];Living will  Does patient want to make changes to medical advance directive?   No - Patient declined No - Patient declined No - Patient declined No - Patient declined   Copy of Healthcare  Power of Attorney in Chart? No - copy requested    No - copy requested No - copy requested     Current Medications (verified) Outpatient Encounter Medications as of 10/26/2023  Medication Sig   aspirin 81 MG tablet Take 1 tablet (81 mg total) by mouth daily.   atorvastatin (LIPITOR) 20 MG tablet TAKE 1 TABLET BY MOUTH DAILY   BIOTIN PO Place 1 patch onto the skin daily.   Glucosamine Sulfate-MSM (MSM-GLUCOSAMINE EX) Place 1 patch onto the skin daily.   losartan (COZAAR) 50 MG tablet Take 1 tablet (50 mg total) by mouth 2 (two) times daily. (Patient taking differently: Take 50 mg by mouth 2 (two) times daily. Patient only taking 75 mg)   metoprolol succinate (TOPROL XL) 25 MG 24 hr tablet Take 1 tablet (25 mg total) by mouth 2 (two) times daily.   Omega-3 Fatty Acids (FISH OIL PO) Take 2 capsules by mouth daily.   polyethylene glycol (MIRALAX / GLYCOLAX) packet Take 17 g by mouth daily.   Propylene Glycol, PF, (SYSTANE COMPLETE PF) 0.6 % SOLN Place 1 drop into both eyes as needed (dry eyes).   TURMERIC PO Take 1 tablet by mouth daily.    zolpidem (AMBIEN) 5 MG tablet TAKE 1/2 TABLET(2.5 MG) BY MOUTH AT BEDTIME AS NEEDED FOR SLEEP   amoxicillin (AMOXIL) 500 MG capsule TAKE 4 CAPSULES BY MOUTH 1 HOUR BEFORE DENTAL APPOINTMENT (Patient not taking: Reported on 10/26/2023)   No facility-administered encounter medications on file as of 10/26/2023.    Allergies (verified) Patient has no  known allergies.   History: Past Medical History:  Diagnosis Date   Arthritis    Benign essential tremor    CHF (congestive heart failure) (HCC)    Heart murmur    High cholesterol    Hypertension    Insomnia    Mitral regurgitation    Mitral valve prolapse    Osteopenia    Paroxysmal atrial fibrillation (HCC) 03/11/2013   Paroxysmal supraventricular tachycardia (HCC)    PONV (postoperative nausea and vomiting)    Dizziness and Nausea   S/P Maze operation for atrial fibrillation 03/18/2013   Complete  bilateral atrial lesion set using cryothermy and biopolar radiofrequency ablation with oversewing of LA appendage via right mini thoracotomy   S/P mitral valve repair 03/18/2013   Complex valvuloplasty including triangular resection of posterior leaflet, artificial Gore-tex neocord placement x4 and 30mm Sorin Memo 3D ring annuloplasty via right mini thoracotomy   Severe mitral regurgitation 03/03/2013   Acute on chronic with acute heart failure    Shortness of breath    Wears glasses    Past Surgical History:  Procedure Laterality Date   ABDOMINAL HYSTERECTOMY     hernia repair   BIOPSY  08/26/2019   Procedure: BIOPSY;  Surgeon: Charlott Rakes, MD;  Location: [facility] ENDOSCOPY;  Service: Endoscopy;;   CARDIAC CATHETERIZATION     COLONOSCOPY     COLONOSCOPY WITH PROPOFOL N/A 08/26/2019   Procedure: COLONOSCOPY WITH PROPOFOL;  Surgeon: Charlott Rakes, MD;  Location: [facility];  Service: Endoscopy;  Laterality: N/A;   INTRAOPERATIVE TRANSESOPHAGEAL ECHOCARDIOGRAM N/A 03/18/2013   Procedure: INTRAOPERATIVE TRANSESOPHAGEAL ECHOCARDIOGRAM;  Surgeon: Purcell Nails, MD;  Location: [facility] OR;  Service: Open Heart Surgery;  Laterality: N/A;   LEFT AND RIGHT HEART CATHETERIZATION WITH CORONARY ANGIOGRAM N/A 03/06/2013   Procedure: LEFT AND RIGHT HEART CATHETERIZATION WITH CORONARY ANGIOGRAM;  Surgeon: [name], MD;  Location: [facility] CATH LAB;  Service: Cardiovascular;  Laterality: N/A;   LOOP RECORDER REMOVAL N/A 09/20/2022   Procedure: LOOP RECORDER REMOVAL;  Surgeon: Marinus Maw, MD;  Location: [facility] INVASIVE CV LAB;  Service: Cardiovascular;  Laterality: N/A;   MAZE N/A 03/18/2013   Procedure: MAZE;  Surgeon: Purcell Nails, MD;  Location: [facility] OR;  Service: Open Heart Surgery;  Laterality: N/A;   MITRAL VALVE REPAIR Right 03/18/2013   Procedure: MINIMALLY INVASIVE MITRAL VALVE REPAIR (MVR);  Surgeon: Purcell Nails, MD;  Location: [facility] OR;  Service: Open Heart Surgery;  Laterality: Right;    PACEMAKER IMPLANT N/A 09/20/2022   Procedure: PACEMAKER IMPLANT;  Surgeon: Marinus Maw, MD;  Location: [facility] INVASIVE CV LAB;  Service: Cardiovascular;  Laterality: N/A;   VARICOSE VEIN SURGERY     WISDOM TOOTH EXTRACTION     Family History  Problem Relation Age of Onset   Pancreatic cancer Mother    Stomach cancer Father    Hypertension Father    Thyroid cancer Sister    Colon cancer Paternal Grandmother    Stomach cancer Paternal Grandfather    Seizures Child    Down syndrome Child    Social History   Socioeconomic History   Marital status: Married    Spouse name: Peyton Najjar   Number of children: 3   Years of education: BS   Highest education level: Not on file  Occupational History   Occupation: Retired    Associate Professor: [employer]  Tobacco Use   Smoking status: Never   Smokeless tobacco: Never  Vaping Use   Vaping status: Never  Used  Substance and Sexual Activity   Alcohol use: Yes    Alcohol/week: 5.0 - 6.0 standard drinks of alcohol    Types: 5 - 6 Glasses of wine per week    Comment: 1 drink/day   Drug use: No   Sexual activity: Yes    Birth control/protection: Surgical  Other Topics Concern   Not on file  Social History Narrative   Patient lives at home with her husband  Peyton Najjar)  - second husband. Patient has college education B.S. 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   Right handed.   Caffeine- one cup daily.   Social Determinants of Health   Financial Resource Strain: Low Risk  (10/24/2023)   Overall Financial Resource Strain (CARDIA)    Difficulty of Paying Living Expenses: Not hard at all  Food Insecurity: No Food Insecurity (10/24/2023)   Hunger Vital Sign    Worried About Running Out of Food in the Last Year: Never true    Ran Out of Food in the Last Year: Never true  Transportation Needs: No Transportation Needs (10/24/2023)   PRAPARE - Administrator, Civil Service (Medical): No    Lack of Transportation (Non-Medical):  No  Physical Activity: Sufficiently Active (10/24/2023)   Exercise Vital Sign    Days of Exercise per Week: 5 days    Minutes of Exercise per Session: 50 min  Stress: No Stress Concern Present (10/24/2023)   [employer] of Occupational Health - Occupational Stress Questionnaire    Feeling of Stress : Only a little  Social Connections: Moderately Integrated (10/24/2023)   Social Connection and Isolation Panel [NHANES]    Frequency of Communication with Friends and Family: More than three times a week    Frequency of Social Gatherings with Friends and Family: Twice a week    Attends Religious Services: Never    Database administrator or Organizations: Yes    Attends Engineer, structural: More than 4 times per year    Marital Status: Married    Tobacco Counseling Counseling given: Not Answered   Clinical Intake:  Pre-visit preparation completed: Yes  Pain : No/denies pain     BMI - recorded: 27.12 Nutritional Status: BMI 25 -29 Overweight Nutritional Risks: None Diabetes: No  How often do you need to have someone help you when you read instructions, pamphlets, or other written materials from your doctor or pharmacy?: 1 - Never  Interpreter Needed?: No  Information entered by :: Hawkin Charo, RMA   Activities of Daily Living    10/24/2023   10:37 AM 10/30/2022   10:15 AM  In your present state of health, do you have any difficulty performing the following activities:  Hearing? 0 0  Vision? 0 0  Difficulty concentrating or making decisions? 0 0  Walking or climbing stairs? 0 0  Dressing or bathing? 0 0  Doing errands, shopping? 0 0  Preparing Food and eating ? N N  Using the Toilet? N N  In the past six months, have you accidently leaked urine? N N  Do you have problems with loss of bowel control? N N  Managing your Medications? N N  Managing your Finances? N N  Housekeeping or managing your Housekeeping? N N    Patient Care Team: Pincus Sanes,  MD as PCP - General (Internal Medicine) Thurmon Fair, MD as PCP - Cardiology (Cardiology) Purcell Nails, MD (Inactive) as Consulting Physician (Cardiothoracic Surgery) Verdis Prime  W, MD (Inactive) as Consulting Physician (Cardiology) Tat, Octaviano Batty, DO as Consulting Physician (Neurology) Kathyrn Sheriff, [facility] (Inactive) as Pharmacist (Pharmacist) Manning Charity, OD as Referring Physician (Optometry)  Indicate any recent Medical Services you may have received from other than [facility] providers in the past year (date may be approximate).     Assessment:   This is a routine wellness examination for Donella.  Hearing/Vision screen Hearing Screening - Comments:: Denies hearing difficulties   Vision Screening - Comments:: Wears eyeglasses   Goals Addressed   None   Depression Screen    10/26/2023    1:16 PM 10/30/2022   10:13 AM 09/12/2022    9:50 AM 10/28/2021    1:19 PM 09/14/2020    8:42 AM 09/06/2020    1:40 PM 09/05/2019    8:12 AM  PHQ 2/9 Scores  PHQ - 2 Score 0 0 0 0 1 0 0  PHQ- 9 Score 0          Fall Risk    10/24/2023   10:37 AM 10/04/2023    8:22 AM 12/18/2022    9:25 AM 10/30/2022   10:14 AM 10/27/2022    8:56 AM  Fall Risk   Falls in the past year? 0 0 1 1 1   Number falls in past yr: 0 0 0 0 0  Injury with Fall? 0 0 0 0 0  Risk for fall due to :    No Fall Risks   Follow up Falls evaluation completed;Falls prevention discussed Falls evaluation completed  Falls evaluation completed     MEDICARE RISK AT HOME: Medicare Risk at Home Any stairs in or around the home?: Yes If so, are there any without handrails?: No Home free of loose throw rugs in walkways, pet beds, electrical cords, etc?: Yes Adequate lighting in your home to reduce risk of falls?: Yes Life alert?: No Use of a cane, walker or w/c?: No Grab bars in the bathroom?: No Shower chair or bench in shower?: Yes Elevated toilet seat or a handicapped toilet?: No  TIMED UP AND GO:  Was the  test performed?  No    Cognitive Function:        10/26/2023    1:01 PM 10/30/2022   10:15 AM  6CIT Screen  What Year? 0 points 0 points  What month? 0 points 0 points  What time? 0 points 0 points  Count back from 20 0 points 0 points  Months in reverse 0 points 0 points  Repeat phrase 0 points 0 points  Total Score 0 points 0 points    Immunizations Immunization History  Administered Date(s) Administered   Fluad Quad(high Dose 65+) 09/05/2019, 09/06/2020, 08/24/2021, 08/09/2022, 08/13/2023   Influenza-Unspecified 08/20/2017   PFIZER(Purple Top)SARS-COV-2 Vaccination 12/12/2019, 12/31/2019, 08/24/2020   Pfizer(Comirnaty)Fall Seasonal Vaccine 12 years and older 08/20/2023   Tdap 04/08/2012, 01/15/2023   Zoster, Live 04/07/2015    TDAP status: Up to date  Flu Vaccine status: Up to date  Pneumococcal vaccine status: Due, Education has been provided regarding the importance of this vaccine. Advised may receive this vaccine at local pharmacy or Health Dept. Aware to provide a copy of the vaccination record if obtained from local pharmacy or Health Dept. Verbalized acceptance and understanding.  Covid-19 vaccine status: Completed vaccines  Qualifies for Shingles Vaccine? Yes   Zostavax completed Yes   Shingrix Completed?: Yes  Screening Tests Health Maintenance  Topic Date Due   COVID-19 Vaccine (5 - 2023-24 season) 11/11/2023 (Originally  10/15/2023)   Pneumonia Vaccine 57+ Years old (1 of 1 - PCV) 09/16/2024 (Originally 12/04/2018)   Colonoscopy  08/25/2024   Medicare Annual Wellness (AWV)  10/25/2024   MAMMOGRAM  03/22/2025   DEXA SCAN  03/22/2025   DTaP/Tdap/Td (3 - Td or Tdap) 01/15/2033   INFLUENZA VACCINE  Completed   Hepatitis C Screening  Completed   HPV VACCINES  Aged Out   Zoster Vaccines- Shingrix  Discontinued    Health Maintenance  There are no preventive care reminders to display for this patient.   Colorectal cancer screening: Type of screening:  Colonoscopy. Completed 08/26/2019. Repeat every 5 years  Mammogram status: Completed 03/23/2023. Repeat every year  Bone Density status: Completed 03/23/2023. Results reflect: Bone density results: OSTEOPENIA. Repeat every 2 years.  Lung Cancer Screening: (Low Dose CT Chest recommended if Age 4-80 years, 20 pack-year currently smoking OR have quit w/in 15years.) does not qualify.   Lung Cancer Screening Referral: N/A  Additional Screening:  Hepatitis C Screening: does qualify; Completed 08/22/2017  Vision Screening: Recommended annual ophthalmology exams for early detection of glaucoma and other disorders of the eye. Is the patient up to date with their annual eye exam?  Yes  Who is the provider or what is the name of the office in which the patient attends annual eye exams? Dr. Emily Filbert If pt is not established with a provider, would they like to be referred to a provider to establish care? No .   Dental Screening: Recommended annual dental exams for proper oral hygiene  Community Resource Referral / Chronic Care Management: CRR required this visit?  No   CCM required this visit?  No     Plan:     I have personally reviewed and noted the following in the patient's chart:   Medical and social history Use of alcohol, tobacco or illicit drugs  Current medications and supplements including opioid prescriptions. Patient is not currently taking opioid prescriptions. Functional ability and status Nutritional status Physical activity Advanced directives List of other physicians Hospitalizations, surgeries, and ER visits in previous 12 months Vitals Screenings to include cognitive, depression, and falls Referrals and appointments  In addition, I have reviewed and discussed with patient certain preventive protocols, quality metrics, and best practice recommendations. A written personalized care plan for preventive services as well as general preventive health recommendations were  provided to patient.     Son Barkan L Gustavo Dispenza, CMA   10/26/2023   After Visit Summary: (MyChart) Due to this being a telephonic visit, the after visit summary with patients personalized plan was offered to patient via MyChart   Nurse Notes: Patient is due for a Pneumonia vaccine.  She is up to date on all other health maintenance.  Patient had no concerns to address today.

## 2023-10-26 NOTE — Patient Instructions (Signed)
Ms. Ariel Simpson , Thank you for taking time to come for your Medicare Wellness Visit. I appreciate your ongoing commitment to your health goals. Please review the following plan we discussed and let me know if I can assist you in the future.   Referrals/Orders/Follow-Ups/Clinician Recommendations: You are due for a Pneumonia vaccine.  Merry Christmas.   This is a list of the screening recommended for you and due dates:  Health Maintenance  Topic Date Due   COVID-19 Vaccine (5 - 2023-24 season) 11/11/2023*   Pneumonia Vaccine (1 of 1 - PCV) 09/16/2024*   Colon Cancer Screening  08/25/2024   Medicare Annual Wellness Visit  10/25/2024   Mammogram  03/22/2025   DEXA scan (bone density measurement)  03/22/2025   DTaP/Tdap/Td vaccine (3 - Td or Tdap) 01/15/2033   Flu Shot  Completed   Hepatitis C Screening  Completed   HPV Vaccine  Aged Out   Zoster (Shingles) Vaccine  Discontinued  *Topic was postponed. The date shown is not the original due date.    Advanced directives: (Copy Requested) Please bring a copy of your health care power of attorney and living will to the office to be added to your chart at your convenience.  Next Medicare Annual Wellness Visit scheduled for next year: Yes

## 2023-10-28 ENCOUNTER — Encounter: Payer: Self-pay | Admitting: Primary Care

## 2023-11-03 ENCOUNTER — Other Ambulatory Visit
Admission: RE | Admit: 2023-11-03 | Discharge: 2023-11-03 | Disposition: A | Discharge status: Home / Self Care | Payer: Medicare Other | Source: Ambulatory Visit | Attending: Primary Care | Admitting: Primary Care

## 2023-11-03 DIAGNOSIS — E875 Hyperkalemia: Secondary | ICD-10-CM | POA: Insufficient documentation

## 2023-11-03 DIAGNOSIS — E559 Vitamin D deficiency, unspecified: Secondary | ICD-10-CM | POA: Insufficient documentation

## 2023-11-03 DIAGNOSIS — E034 Atrophy of thyroid (acquired): Secondary | ICD-10-CM | POA: Insufficient documentation

## 2023-11-03 DIAGNOSIS — E7800 Pure hypercholesterolemia, unspecified: Secondary | ICD-10-CM | POA: Insufficient documentation

## 2023-11-03 DIAGNOSIS — G40909 Epilepsy, unspecified, not intractable, without status epilepticus: Secondary | ICD-10-CM | POA: Insufficient documentation

## 2023-11-03 LAB — COMPREHENSIVE METABOLIC PANEL
ALT: 31 U/L (ref 0–35)
AST: 22 U/L (ref 0–35)
Albumin: 5 g/dL (ref 3.5–5.2)
Alk Phos: 71 U/L (ref 35–105)
Anion Gap: 14 (ref 7–16)
Bilirubin,Total: 0.4 mg/dL (ref 0.0–1.2)
CO2: 28 mmol/L (ref 20–28)
Calcium: 9.8 mg/dL (ref 8.6–10.2)
Chloride: 98 mmol/L (ref 96–108)
Creatinine: 0.61 mg/dL (ref 0.51–0.95)
Glucose: 93 mg/dL (ref 60–99)
Lab: 20 mg/dL (ref 6–20)
Potassium: 4.3 mmol/L (ref 3.3–5.1)
Sodium: 140 mmol/L (ref 133–145)
Total Protein: 7.7 g/dL (ref 6.3–7.7)
eGFR BY CREAT: 96 *

## 2023-11-03 LAB — LIPID PANEL
Chol/HDL Ratio: 2
Cholesterol: 244 mg/dL — AB
HDL: 122 mg/dL — ABNORMAL HIGH (ref 40–60)
LDL Calculated: 107 mg/dL
Non HDL Cholesterol: 122 mg/dL
Triglycerides: 74 mg/dL

## 2023-11-03 LAB — VITAMIN D: 25-OH Vit Total: 47 ng/mL (ref 30–60)

## 2023-11-03 LAB — TSH: TSH: 1.37 u[IU]/mL (ref 0.27–4.20)

## 2023-11-07 LAB — CARBAMAZEPINE, FREE & TOTAL
Carbamazepine, Free: 21 % (ref 8.0–35.0)
Carbamazepine,Free: 2.2 ug/mL (ref 1.0–3.0)
Carbamazepine,Total: 10.5 ug/mL (ref 4.0–12.0)

## 2023-11-12 ENCOUNTER — Ambulatory Visit: Payer: Medicare Other | Attending: Primary Care | Admitting: Primary Care

## 2023-11-12 ENCOUNTER — Encounter: Payer: Self-pay | Admitting: Primary Care

## 2023-11-12 ENCOUNTER — Other Ambulatory Visit: Payer: Self-pay

## 2023-11-12 VITALS — BP 130/84 | HR 81 | Temp 97.6°F | Ht 62.99 in | Wt 110.5 lb

## 2023-11-12 DIAGNOSIS — J069 Acute upper respiratory infection, unspecified: Secondary | ICD-10-CM | POA: Insufficient documentation

## 2023-11-12 DIAGNOSIS — Z1152 Encounter for screening for COVID-19: Secondary | ICD-10-CM | POA: Insufficient documentation

## 2023-11-12 DIAGNOSIS — Z20828 Contact with and (suspected) exposure to other viral communicable diseases: Secondary | ICD-10-CM | POA: Insufficient documentation

## 2023-11-12 DIAGNOSIS — Z20822 Contact with and (suspected) exposure to covid-19: Secondary | ICD-10-CM | POA: Insufficient documentation

## 2023-11-12 NOTE — Progress Notes (Signed)
11/12/2023  REASON FOR VISIT: Ear Fullness and Nasal Congestion    SUBJECTIVE:   [name] is a 67 y.o. female who presents today for evaluation of illness. Onset of symptoms began 2 days ago. Developed a migraine headache, improved the following day but then started up again midday. She noticed increased congestion, postnasal drip. It has started to feel like a sinus infection which she has a history of. No fevers or sob. Cough only due to the drainage. She has added tylenol into her regimen for symptom relief. Home covid test was negative. No known ill contacts.     OBJECTIVE: BP 130/84   Pulse 81   Temp 36.4 C (97.6 F) (Temporal)   Ht 1.6 m (5' 2.99")   Wt 50.1 kg (110 lb 8 oz)   SpO2 100%   BMI 19.58 kg/m   Physical Exam  Vitals and nursing note reviewed.   HENT:      Head: Normocephalic.      Right Ear: Tympanic membrane, ear canal and external ear normal.      Left Ear: Tympanic membrane, ear canal and external ear normal.      Nose: Congestion present.      Mouth/Throat:      Mouth: Mucous membranes are moist.      Pharynx: Postnasal drip present.   Cardiovascular:      Rate and Rhythm: Normal rate and regular rhythm.      Heart sounds: Normal heart sounds. No murmur heard.     No friction rub. No gallop.   Pulmonary:      Effort: Pulmonary effort is normal. No respiratory distress.      Breath sounds: Normal breath sounds. No stridor. No decreased breath sounds, wheezing, rhonchi or rales.   Lymphadenopathy:      Cervical: No cervical adenopathy.   Neurological:      Mental Status: She is alert.           ASSESSMENT & PLAN:  1. URI, acute (Primary)  - COVID/Influenza A & B/RSV NAAT (PCR); Future  Consistent with a viral process. Will evaluate for covid and notify once results available. Continue with supportive measures. If negative and symptoms persist would consider antibiotics given history.       There are no Patient Instructions on file for this visit.    ORDERS:    Orders Placed This  Encounter   Procedures    COVID/Influenza A & B/RSV NAAT (PCR)     FOLLOW-UP: Follow up if symptoms worsen or fail to improve.    Conley Simmonds, FNP-C    Medications at end of visit:  Current Outpatient Medications   Medication Sig    amLODIPine (NORVASC) 5 mg tablet TAKE 1 TABLET BY MOUTH EVERY DAY    carBAMazepine (TEGRETOL XR) 200 mg 12 hr tablet TAKE 2 TABLETS BY MOUTH TWO TIMES DAILY WITH MEALS ALONG WITH 100MG  TABLET SWALLOW WHOLE, DO NOT BREAK, CRUSH OR CHEW    triamcinolone (KENALOG) 0.1 % ointment Apply topically 2 times daily. to the following areas: ear    solifenacin (VESICARE) 10 mg tablet TAKE 1 TABLET BY MOUTH EVERY DAY    fluticasone (FLONASE) 50 MCG/ACT nasal spray Spray 1 spray into nostril daily.    fluticasone (FLOVENT DISKUS) 250 MCG/ACT diskus inhaler Inhale 1 puff into the lungs 2 times daily.    levothyroxine (SYNTHROID, LEVOTHROID) 25 mcg tablet TAKE 1 TABLET BY MOUTH EVERY DAY BEFORE BREAKFAST    generic DME Walker.  Use as directed.    carBAMazepine (CARBATROL) 100 mg 12 hr capsule TAKE 1 CAPSULE BY MOUTH TWO TIMES DAILY    azelastine (ASTEPRO) 137 MCG/SPRAY SOLN nasal solution SPRAY 2 SPRAYS INTO EACH NOSTRIL TWO TIMES DAILY    econazole nitrate (SPECTAZOLE) 1 % cream Apply topically daily. to the following areas: TOES/TOENAIL    RABEprazole (ACIPHEX) 20 MG tablet TAKE 1 TABLET BY MOUTH EVERY DAY    atorvastatin (LIPITOR) 10 mg tablet TAKE 1 TABLET BY MOUTH EVERY DAY WITH DINNER    generic DME PT evaluate and treat, strengthening and conditioning dx: right foot and back pain    guaiFENesin (MUCINEX) 600 MG 12 hr tablet Take 2 tablets (1,200 mg total) by mouth 2 times daily.    generic DME Bilateral custom orthotics; Dx: bilateral foot pain    metroNIDAZOLE (METROCREAM) 0.75 % cream Apply topically 2 times daily    famotidine (PEPCID) 20 mg tablet Take 1 tablet (20 mg total) by mouth 2 times daily    pseudoephedrine (SUDAFED) 30 mg tablet TAKE 1 TABLET BY MOUTH EVERY 4 HOURS AS NEEDED  FOR CONGESTION    [employer] 12 HOUR NASAL DECONGESTANT 120 MG 12 hr tablet TAKE 1 TABLET BY MOUTH EVERY 12 HOURS    Non-System Medication Bilateral custom-made orthotics  Dx: bilateral foot pain, history of stroke, disp: 1pair    Calcium Carb-Cholecalciferol 600-200 MG-UNIT TABS Take 1 tablet by mouth 3 times daily    Denosumab (PROLIA SC) Inject 1 Device into the skin every 6 months    loratadine (CLARITIN) 10 mg tablet Take 1 tablet (10 mg total) by mouth daily.    Multiple Vitamin (MULTIVITAMIN) per tablet Take 1 tablet by mouth daily.    cholecalciferol (VITAMIN D) 1000 UNITS capsule Take 1 capsule (1,000 units total) by mouth daily.     No current facility-administered medications for this visit.

## 2023-11-13 ENCOUNTER — Encounter: Payer: Self-pay | Admitting: Primary Care

## 2023-11-13 DIAGNOSIS — J069 Acute upper respiratory infection, unspecified: Secondary | ICD-10-CM

## 2023-11-13 DIAGNOSIS — J329 Chronic sinusitis, unspecified: Secondary | ICD-10-CM

## 2023-11-13 LAB — COVID/INFLUENZA A & B/RSV NAAT (PCR)
COVID-19 NAAT (PCR): NEGATIVE
Influenza A NAAT (PCR): NEGATIVE
Influenza B NAAT (PCR): NEGATIVE
RSV NAAT (PCR): NEGATIVE

## 2023-11-13 MED ORDER — DOXYCYCLINE HYCLATE 100 MG PO CAPS *I*
100.0000 mg | ORAL_CAPSULE | Freq: Two times a day (BID) | ORAL | 0 refills | Status: AC
Start: 2023-11-13 — End: 2023-11-23

## 2023-11-25 ENCOUNTER — Other Ambulatory Visit: Payer: Self-pay

## 2023-11-26 ENCOUNTER — Encounter: Payer: Self-pay | Admitting: Optometry

## 2023-11-26 ENCOUNTER — Other Ambulatory Visit: Payer: Self-pay

## 2023-11-26 ENCOUNTER — Ambulatory Visit: Payer: Medicare Other | Attending: Optometry | Admitting: Optometry

## 2023-11-26 DIAGNOSIS — H02831 Dermatochalasis of right upper eyelid: Secondary | ICD-10-CM | POA: Insufficient documentation

## 2023-11-26 DIAGNOSIS — H25813 Combined forms of age-related cataract, bilateral: Secondary | ICD-10-CM | POA: Insufficient documentation

## 2023-11-26 DIAGNOSIS — H02834 Dermatochalasis of left upper eyelid: Secondary | ICD-10-CM | POA: Insufficient documentation

## 2023-11-26 DIAGNOSIS — H52211 Irregular astigmatism, right eye: Secondary | ICD-10-CM

## 2023-11-26 DIAGNOSIS — D3131 Benign neoplasm of right choroid: Secondary | ICD-10-CM | POA: Insufficient documentation

## 2023-11-26 DIAGNOSIS — H2513 Age-related nuclear cataract, bilateral: Secondary | ICD-10-CM | POA: Insufficient documentation

## 2023-11-26 NOTE — Progress Notes (Addendum)
 Assessment:       1. Combined cataract of both eyes (Primary)  Worse OU  - Corneal Topography-OU; Future  - Corneal Topography-OU  OD: Asymmetrical mild toricity, suspect poor fixation  OS: mild ATR toricity    2. Choroidal nevus of right eye  stable  - OCT, mac-OU; Future  - OCT, mac-OU  NL RPE/foveal contour OU        Plan:     Discussed findings and addressed concerns. Informed cataract is primary limiting factor in visual compromise and is the cause of difficulty w/ ADL activities. Educated as to risks, benefits, limitations as well as all appropriate options for IOL type. Motivated to proceed and appears most interested in monofocal IOL target -0.50 OU. Px has no issue with wearing glasses following surgery. Refer to Dr Remigio Eisenmenger for consultation, advised to initiate copious Preservative Free artificial tears 4 times daily x 1W prior to pre-op metrics (testing), RTC w/ RAR for post op care as determined by Dr Remigio Eisenmenger.    12/04/2023: Hi Dr. Alycia Rossetti,  I want to make sure that I understand the cataract surgery procedure that was decided upon when we met last week. Would you explain again the decision reached and why we decided upon it?  I really appreciate it. Thank you.     Sincerely,  [name]    12/04/2023: spoke with Px to discuss options and reasons for selection as above. She understands more fully and remains in agreement./rar

## 2023-11-30 ENCOUNTER — Other Ambulatory Visit (HOSPITAL_COMMUNITY): Payer: Self-pay

## 2023-11-30 ENCOUNTER — Other Ambulatory Visit: Payer: Self-pay | Admitting: *Deleted

## 2023-11-30 MED ORDER — METOPROLOL SUCCINATE ER 25 MG PO TB24
25.0000 mg | ORAL_TABLET | Freq: Two times a day (BID) | ORAL | 1 refills | Status: DC
  Filled 2023-11-30: qty 180, 90d supply, fill #0

## 2023-12-03 ENCOUNTER — Encounter: Payer: Self-pay | Admitting: Optometry

## 2023-12-04 ENCOUNTER — Other Ambulatory Visit: Payer: Self-pay | Admitting: Ophthalmology

## 2023-12-04 DIAGNOSIS — H52223 Regular astigmatism, bilateral: Secondary | ICD-10-CM

## 2023-12-04 DIAGNOSIS — H2513 Age-related nuclear cataract, bilateral: Secondary | ICD-10-CM

## 2023-12-04 DIAGNOSIS — D3131 Benign neoplasm of right choroid: Secondary | ICD-10-CM

## 2023-12-04 DIAGNOSIS — H25813 Combined forms of age-related cataract, bilateral: Secondary | ICD-10-CM

## 2023-12-04 DIAGNOSIS — H5213 Myopia, bilateral: Secondary | ICD-10-CM

## 2023-12-04 DIAGNOSIS — H02831 Dermatochalasis of right upper eyelid: Secondary | ICD-10-CM

## 2023-12-04 DIAGNOSIS — H524 Presbyopia: Secondary | ICD-10-CM

## 2023-12-10 ENCOUNTER — Other Ambulatory Visit: Payer: Self-pay

## 2023-12-11 ENCOUNTER — Ambulatory Visit: Payer: Medicare Other | Attending: Ophthalmology

## 2023-12-11 ENCOUNTER — Ambulatory Visit: Payer: Medicare Other

## 2023-12-11 DIAGNOSIS — H5213 Myopia, bilateral: Secondary | ICD-10-CM

## 2023-12-11 DIAGNOSIS — D3131 Benign neoplasm of right choroid: Secondary | ICD-10-CM | POA: Insufficient documentation

## 2023-12-11 DIAGNOSIS — H25813 Combined forms of age-related cataract, bilateral: Secondary | ICD-10-CM

## 2023-12-11 DIAGNOSIS — H52223 Regular astigmatism, bilateral: Secondary | ICD-10-CM

## 2023-12-11 DIAGNOSIS — H524 Presbyopia: Secondary | ICD-10-CM

## 2023-12-11 DIAGNOSIS — H2513 Age-related nuclear cataract, bilateral: Secondary | ICD-10-CM

## 2023-12-11 DIAGNOSIS — H02831 Dermatochalasis of right upper eyelid: Secondary | ICD-10-CM

## 2023-12-11 DIAGNOSIS — H02834 Dermatochalasis of left upper eyelid: Secondary | ICD-10-CM

## 2023-12-14 ENCOUNTER — Telehealth: Payer: Self-pay | Admitting: Cardiovascular Disease

## 2023-12-14 ENCOUNTER — Encounter: Payer: Self-pay | Admitting: Internal Medicine

## 2023-12-14 ENCOUNTER — Ambulatory Visit: Payer: Medicare Other | Attending: Internal Medicine | Admitting: Internal Medicine

## 2023-12-14 VITALS — BP 122/80 | HR 67 | Ht 66.0 in | Wt 174.6 lb

## 2023-12-14 DIAGNOSIS — Z950 Presence of cardiac pacemaker: Secondary | ICD-10-CM | POA: Diagnosis not present

## 2023-12-14 DIAGNOSIS — I340 Nonrheumatic mitral (valve) insufficiency: Secondary | ICD-10-CM | POA: Diagnosis not present

## 2023-12-14 DIAGNOSIS — I480 Paroxysmal atrial fibrillation: Secondary | ICD-10-CM | POA: Diagnosis not present

## 2023-12-14 DIAGNOSIS — E785 Hyperlipidemia, unspecified: Secondary | ICD-10-CM | POA: Diagnosis not present

## 2023-12-14 DIAGNOSIS — I471 Supraventricular tachycardia, unspecified: Secondary | ICD-10-CM | POA: Diagnosis not present

## 2023-12-14 DIAGNOSIS — Z9889 Other specified postprocedural states: Secondary | ICD-10-CM

## 2023-12-14 DIAGNOSIS — I495 Sick sinus syndrome: Secondary | ICD-10-CM | POA: Diagnosis not present

## 2023-12-14 DIAGNOSIS — I10 Essential (primary) hypertension: Secondary | ICD-10-CM | POA: Diagnosis not present

## 2023-12-14 NOTE — Telephone Encounter (Signed)
Called and spoke to patient. Verified name and DOB. Patient report SOB x 1 month that has progressively gotten worse. She stated she's having to take more breaths when talking and when she is lying down she can feel her whole body pulsating. She stated she is very active and exercise but lately she becomes SOB faster. She deny CP, dizziness, lightheadedness or any other symptoms at this time. Scheduled patient for an appt today at 3:20 with Dr Jacques Navy. Advised patient per ED precautions. Verbalized understanding and agree.

## 2023-12-14 NOTE — Patient Instructions (Signed)
Medication Instructions:  Your physician recommends that you continue on your current medications as directed. Please refer to the Current Medication list given to you today.  *If you need a refill on your cardiac medications before your next appointment, please call your pharmacy*   Lab Work: BMET and CBC, come Monday to office (any time from 8 am -4 pm) If you have labs (blood work) drawn today and your tests are completely normal, you will receive your results only by: MyChart Message (if you have MyChart) OR A paper copy in the mail If you have any lab test that is abnormal or we need to change your treatment, we will call you to review the results.   Testing/Procedures:   You are scheduled for a TEE (Transesophageal Echocardiogram) on Tuesday, February 4 with Dr. Jacques Navy.  Please arrive at the [facility] (Main Entrance A) at [facility]: [address] [place], [place] [address] at 7:30 AM (This time is 1 hour(s) before your procedure to ensure your preparation).   Free valet parking service is available. You will check in at ADMITTING.   *Please Note: You will receive a call the day before your procedure to confirm the appointment time. That time may have changed from the original time based on the schedule for that day.*   DIET:  Nothing to eat or drink after midnight except a sip of water with medications (see medication instructions below)  FYI:  For your safety, and to allow [place] to monitor your vital signs accurately during the surgery/procedure we request: If you have artificial nails, gel coating, SNS etc, please have those removed prior to your surgery/procedure. Not having the nail coverings /polish removed may result in cancellation or delay of your surgery/procedure.  Your support person will be asked to wait in the waiting room during your procedure.  It is OK to have someone drop you off and come back when you are ready to be discharged.  You cannot drive after  the procedure and will need someone to drive you home.  Bring your insurance cards.  *Special Note: Every effort is made to have your procedure done on time. Occasionally there are emergencies that occur at the hospital that may cause delays. Please be patient if a delay does occur.       Follow-Up: At [facility], you and your health needs are our priority.  As part of our continuing mission to provide you with exceptional heart care, we have created designated Provider Care Teams.  These Care Teams include your primary Cardiologist (physician) and Advanced Practice Providers (APPs -  Physician Assistants and Nurse Practitioners) who all work together to provide you with the care you need, when you need it.  Your next appointment:    TBD after TEE  Provider:   Thurmon Fair, MD  or  Dr. Weston Brass, MD

## 2023-12-14 NOTE — Progress Notes (Signed)
Cardiology Office Note:  .   Date:  12/14/2023  ID:  Ariel Simpson, DOB May 04, 1956, MRN [id number] PCP: Pincus Sanes, MD  [facility] Providers Cardiologist:  Thurmon Fair, MD    History of Present Illness: .   Ariel Simpson is a 68 y.o. female.  Discussed the use of AI scribe software for clinical note transcription with the patient, who gave verbal consent to proceed.  History of Present Illness   The patient, with a history of valvular heart disease, mitral valve prolapse, and mitral insufficiency, presents with a one-month history of progressive shortness of breath. The patient underwent mitral valve repair in 2014, but recent echocardiogram shows recurrent severe mitral valve insufficiency. Despite this, the patient remained asymptomatic until recently.  The patient reports a new sensation of feeling her pulse throughout her body, particularly when lying down. This sensation is described as prominent and occurs every night, disrupting sleep. The patient also reports changes in her breathing pattern, particularly when talking, and feels she has to pull her breath in more than usual. Despite these symptoms, the patient continues to attend yoga classes and remains active.  The patient also has a history of hypertension and hyperlipidemia, managed with losartan and atorvastatin respectively. Home blood pressure readings have been around 150/90. The patient is concerned about the progression of her symptoms and is seeking further evaluation and management.        ROS: negative except per HPI above.  Studies Reviewed: Marland Kitchen   EKG Interpretation Date/Time:  Friday December 14 2023 15:56:55 EST Ventricular Rate:  67 PR Interval:  206 QRS Duration:  88 QT Interval:  424 QTC Calculation: 448 R Axis:   21  Text Interpretation: Atrial-paced rhythm When compared with ECG of 24-Sep-2023 10:24, No significant change was found Confirmed by Weston Brass ([id number]) on 12/14/2023  4:18:00 PM    Results   LABS Blood gas: Congestive heart failure  DIAGNOSTIC Echocardiogram: Recurrent severe mitral valve regurgitation with an eccentric jet. Left ventricle normal. EF 55-60%. Mild to moderate left atrial dilation. (08/2023)     Risk Assessment/Calculations:    Physical Exam:   VS:  BP 122/80   Pulse 67   Ht 5\' 6"  (1.676 m)   Wt 174 lb 9.6 oz (79.2 kg)   SpO2 97%   BMI 28.18 kg/m    Wt Readings from Last 3 Encounters:  12/14/23 174 lb 9.6 oz (79.2 kg)  10/26/23 168 lb (76.2 kg)  10/04/23 168 lb 9.6 oz (76.5 kg)     Physical Exam   CHEST: Lungs clear to auscultation     GEN: Well nourished, well developed in no acute distress NECK: No JVD; No carotid bruits CARDIAC: RRR, 3/6 murmur systolic RESPIRATORY:  Clear to auscultation without rales, wheezing or rhonchi  ABDOMEN: Soft, non-tender, non-distended EXTREMITIES:  No edema; No deformity   ASSESSMENT AND PLAN: .    Assessment & Plan Paroxysmal atrial fibrillation (HCC)  SVT (supraventricular tachycardia) (HCC)  S/P mitral valve repair  Nonrheumatic mitral valve regurgitation  Essential hypertension  Sinus node dysfunction (HCC)  Pacemaker  Hyperlipidemia LDL goal <100   Assessment and Plan    Mitral Valve Regurgitation Progressive shortness of breath over one month. History of mitral valve repair in 2014 with recurrent severe mitral valve insufficiency noted on echocardiogram in October 2024. No current evidence of congestive heart failure. -Order Transesophageal Echocardiogram (TEE) on December 25, 2023 to better assess the severity of the mitral valve regurgitation and  determine the need for intervention. -Discuss findings as a team and plan for follow-up appointment to discuss next steps.  Hypertension On Losartan monotherapy with home blood pressure readings around 150/90. -Continue Losartan 100mg  daily. -Consider further medication adjustments after TEE  results.  Hyperlipidemia On Atorvastatin. -Continue Atorvastatin.  Pacemaker History of syncopal episode related to a pause, subsequently underwent Medtronic dual chamber permanent pacemaker placement by Dr. Ladona Ridgel. -No changes to current management.  General Health Maintenance -Continue current lifestyle activities as tolerated, with instructions to back off if symptoms of shortness of breath or congestive heart failure develop. -Check blood pressure if symptoms of shortness of breath increase.            Informed Consent   Shared Decision Making/Informed Consent  The risks [esophageal damage, perforation (1:10,000 risk), bleeding, pharyngeal hematoma as well as other potential complications associated with conscious sedation including aspiration, arrhythmia, respiratory failure and death], benefits (treatment guidance and diagnostic support) and alternatives of a transesophageal echocardiogram were discussed in detail with Ariel Simpson and she is willing to proceed.       I spent 40 minutes in the care of Ariel Simpson today including reviewing labs (09/13/23), reviewing studies (echo 09/17/23 ), face to face time discussing treatment options (30 min), and documenting in the encounter.

## 2023-12-14 NOTE — Telephone Encounter (Signed)
Pt c/o Shortness Of Breath: STAT if SOB developed within the last 24 hours or pt is noticeably SOB on the phone  1. Are you currently SOB (can you hear that pt is SOB on the phone)? no  2. How long have you been experiencing SOB? A few days.   3. Are you SOB when sitting or when up moving around? Patient states even when she is sitting, when takes she notices she has to take more breathes than normal.   4. Are you currently experiencing any other symptoms? She states she can feels her pulse everywhere, she can feel it in her neck, legs, and abdomen. Especially at night when she lays down, she stated it keeps her up at night.

## 2023-12-14 NOTE — H&P (View-Only) (Signed)
Cardiology Office Note:  .   Date:  12/14/2023  ID:  Ariel Simpson, DOB May 04, 1956, MRN [id number] PCP: Pincus Sanes, MD  [facility] Providers Cardiologist:  Thurmon Fair, MD    History of Present Illness: .   Ariel Simpson is a 68 y.o. female.  Discussed the use of AI scribe software for clinical note transcription with the patient, who gave verbal consent to proceed.  History of Present Illness   The patient, with a history of valvular heart disease, mitral valve prolapse, and mitral insufficiency, presents with a one-month history of progressive shortness of breath. The patient underwent mitral valve repair in 2014, but recent echocardiogram shows recurrent severe mitral valve insufficiency. Despite this, the patient remained asymptomatic until recently.  The patient reports a new sensation of feeling her pulse throughout her body, particularly when lying down. This sensation is described as prominent and occurs every night, disrupting sleep. The patient also reports changes in her breathing pattern, particularly when talking, and feels she has to pull her breath in more than usual. Despite these symptoms, the patient continues to attend yoga classes and remains active.  The patient also has a history of hypertension and hyperlipidemia, managed with losartan and atorvastatin respectively. Home blood pressure readings have been around 150/90. The patient is concerned about the progression of her symptoms and is seeking further evaluation and management.        ROS: negative except per HPI above.  Studies Reviewed: Marland Kitchen   EKG Interpretation Date/Time:  Friday December 14 2023 15:56:55 EST Ventricular Rate:  67 PR Interval:  206 QRS Duration:  88 QT Interval:  424 QTC Calculation: 448 R Axis:   21  Text Interpretation: Atrial-paced rhythm When compared with ECG of 24-Sep-2023 10:24, No significant change was found Confirmed by Weston Brass ([id number]) on 12/14/2023  4:18:00 PM    Results   LABS Blood gas: Congestive heart failure  DIAGNOSTIC Echocardiogram: Recurrent severe mitral valve regurgitation with an eccentric jet. Left ventricle normal. EF 55-60%. Mild to moderate left atrial dilation. (08/2023)     Risk Assessment/Calculations:    Physical Exam:   VS:  BP 122/80   Pulse 67   Ht 5\' 6"  (1.676 m)   Wt 174 lb 9.6 oz (79.2 kg)   SpO2 97%   BMI 28.18 kg/m    Wt Readings from Last 3 Encounters:  12/14/23 174 lb 9.6 oz (79.2 kg)  10/26/23 168 lb (76.2 kg)  10/04/23 168 lb 9.6 oz (76.5 kg)     Physical Exam   CHEST: Lungs clear to auscultation     GEN: Well nourished, well developed in no acute distress NECK: No JVD; No carotid bruits CARDIAC: RRR, 3/6 murmur systolic RESPIRATORY:  Clear to auscultation without rales, wheezing or rhonchi  ABDOMEN: Soft, non-tender, non-distended EXTREMITIES:  No edema; No deformity   ASSESSMENT AND PLAN: .    Assessment & Plan Paroxysmal atrial fibrillation (HCC)  SVT (supraventricular tachycardia) (HCC)  S/P mitral valve repair  Nonrheumatic mitral valve regurgitation  Essential hypertension  Sinus node dysfunction (HCC)  Pacemaker  Hyperlipidemia LDL goal <100   Assessment and Plan    Mitral Valve Regurgitation Progressive shortness of breath over one month. History of mitral valve repair in 2014 with recurrent severe mitral valve insufficiency noted on echocardiogram in October 2024. No current evidence of congestive heart failure. -Order Transesophageal Echocardiogram (TEE) on December 25, 2023 to better assess the severity of the mitral valve regurgitation and  determine the need for intervention. -Discuss findings as a team and plan for follow-up appointment to discuss next steps.  Hypertension On Losartan monotherapy with home blood pressure readings around 150/90. -Continue Losartan 100mg  daily. -Consider further medication adjustments after TEE  results.  Hyperlipidemia On Atorvastatin. -Continue Atorvastatin.  Pacemaker History of syncopal episode related to a pause, subsequently underwent Medtronic dual chamber permanent pacemaker placement by Dr. Ladona Ridgel. -No changes to current management.  General Health Maintenance -Continue current lifestyle activities as tolerated, with instructions to back off if symptoms of shortness of breath or congestive heart failure develop. -Check blood pressure if symptoms of shortness of breath increase.            Informed Consent   Shared Decision Making/Informed Consent  The risks [esophageal damage, perforation (1:10,000 risk), bleeding, pharyngeal hematoma as well as other potential complications associated with conscious sedation including aspiration, arrhythmia, respiratory failure and death], benefits (treatment guidance and diagnostic support) and alternatives of a transesophageal echocardiogram were discussed in detail with Ariel Simpson and she is willing to proceed.       I spent 40 minutes in the care of [name] today including reviewing labs (09/13/23), reviewing studies (echo 09/17/23 ), face to face time discussing treatment options (30 min), and documenting in the encounter.

## 2023-12-17 ENCOUNTER — Other Ambulatory Visit: Payer: Self-pay | Admitting: Orthopedic Surgery

## 2023-12-17 DIAGNOSIS — Z01810 Encounter for preprocedural cardiovascular examination: Secondary | ICD-10-CM | POA: Diagnosis not present

## 2023-12-17 DIAGNOSIS — Z9889 Other specified postprocedural states: Secondary | ICD-10-CM | POA: Diagnosis not present

## 2023-12-17 DIAGNOSIS — M810 Age-related osteoporosis without current pathological fracture: Secondary | ICD-10-CM

## 2023-12-18 ENCOUNTER — Encounter: Payer: Self-pay | Admitting: *Deleted

## 2023-12-18 LAB — BASIC METABOLIC PANEL
BUN/Creatinine Ratio: 14 (ref 12–28)
BUN: 11 mg/dL (ref 8–27)
CO2: 23 mmol/L (ref 20–29)
Calcium: 9.8 mg/dL (ref 8.7–10.3)
Chloride: 102 mmol/L (ref 96–106)
Creatinine, Ser: 0.77 mg/dL (ref 0.57–1.00)
Glucose: 83 mg/dL (ref 70–99)
Potassium: 4.8 mmol/L (ref 3.5–5.2)
Sodium: 141 mmol/L (ref 134–144)
eGFR: 83 mL/min/{1.73_m2} (ref >59)

## 2023-12-18 LAB — CBC
Hematocrit: 40.3 % (ref 34.0–46.6)
Hemoglobin: 13.5 g/dL (ref 11.1–15.9)
MCH: 31.2 pg (ref 26.6–33.0)
MCHC: 33.5 g/dL (ref 31.5–35.7)
MCV: 93 fL (ref 79–97)
Platelets: 182 10*3/uL (ref 150–450)
RBC: 4.33 x10E6/uL (ref 3.77–5.28)
RDW: 12.1 % (ref 11.7–15.4)
WBC: 7.1 10*3/uL (ref 3.4–10.8)

## 2023-12-19 ENCOUNTER — Other Ambulatory Visit: Payer: Self-pay

## 2023-12-19 ENCOUNTER — Ambulatory Visit (INDEPENDENT_AMBULATORY_CARE_PROVIDER_SITE_OTHER): Payer: Medicare Other

## 2023-12-19 DIAGNOSIS — I495 Sick sinus syndrome: Secondary | ICD-10-CM

## 2023-12-20 ENCOUNTER — Ambulatory Visit: Payer: Medicare Other | Attending: Neurology | Admitting: Neurology

## 2023-12-20 ENCOUNTER — Encounter: Payer: Self-pay | Admitting: Internal Medicine

## 2023-12-20 VITALS — BP 146/79 | HR 78 | Temp 97.5°F | Wt 110.0 lb

## 2023-12-20 DIAGNOSIS — G40909 Epilepsy, unspecified, not intractable, without status epilepticus: Secondary | ICD-10-CM | POA: Insufficient documentation

## 2023-12-20 LAB — CUP PACEART REMOTE DEVICE CHECK
Battery Remaining Longevity: 152 mo
Battery Voltage: 3.05 V
Brady Statistic AP VP Percent: 0.05 %
Brady Statistic AP VS Percent: 89.54 %
Brady Statistic AS VP Percent: 0 %
Brady Statistic AS VS Percent: 10.41 %
Brady Statistic RA Percent Paced: 89.94 %
Brady Statistic RV Percent Paced: 0.05 %
Date Time Interrogation Session: 20250128194437
Implantable Lead Connection Status: 753985
Implantable Lead Connection Status: 753985
Implantable Lead Implant Date: 20231101
Implantable Lead Implant Date: 20231101
Implantable Lead Location: 753859
Implantable Lead Location: 753860
Implantable Lead Model: 3830
Implantable Lead Model: 5076
Implantable Pulse Generator Implant Date: 20231101
Lead Channel Impedance Value: 323 Ohm
Lead Channel Impedance Value: 361 Ohm
Lead Channel Impedance Value: 475 Ohm
Lead Channel Impedance Value: 532 Ohm
Lead Channel Pacing Threshold Amplitude: 0.75 V
Lead Channel Pacing Threshold Amplitude: 0.875 V
Lead Channel Pacing Threshold Pulse Width: 0.4 ms
Lead Channel Pacing Threshold Pulse Width: 0.4 ms
Lead Channel Sensing Intrinsic Amplitude: 1.75 mV
Lead Channel Sensing Intrinsic Amplitude: 1.75 mV
Lead Channel Sensing Intrinsic Amplitude: 17.5 mV
Lead Channel Sensing Intrinsic Amplitude: 17.5 mV
Lead Channel Setting Pacing Amplitude: 1.5 V
Lead Channel Setting Pacing Amplitude: 2 V
Lead Channel Setting Pacing Pulse Width: 0.4 ms
Lead Channel Setting Sensing Sensitivity: 1.2 mV
Zone Setting Status: 755011

## 2023-12-20 NOTE — Patient Instructions (Signed)
 Please reach out to the clinic with any additional questions or concerns. We encourage you to sign up and use MyChart for any non-urgent medical questions as MyChart is the preferred method of communication.  Please contact your pharmacy for medication refills and allow up to 2 business days for routine prescription refills.  For urgent messages, please call the clinic at [phone].  The clinic is open from 8am to 4:30pm Monday through Friday.    You can get [facility] appointment reminders by text - please make sure we have your cell phone number on file at check in/check-out.  Then you can just text [facility] to [phone] to receive appointment messages.     Will hold on any changes in medication  Monitor tremor for now, no treatment indicated  Follow up 6 months

## 2023-12-20 NOTE — Progress Notes (Signed)
 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    She has had no major issues since our last visit.  We discussed the fact that she is likely to require cataract surgery at some point within the next 6 to 12 months.  She still works about 10 to 15 hours/week and this has been going well without any concerns.    She notes that she has a mild tremor which at times does interfere with her writing or eating.  There are no clear exacerbating factors.  She does not drink any caffeine or alcohol.    Her review of systems is notable for a probable upper respiratory infection last December but was apparently COVID negative.    Examination:  Vitals:    12/20/23 0859   BP: 146/79   Pulse: 78   Temp: 36.4 C (97.5 F)   Weight: 49.9 kg (110 lb)     She is alert and oriented.  Her speech is clear.  Comprehension is intact.  Affect is appropriate.  Visual field testing is full to confrontation.  She has full versions.  There is no nystagmus.  Strength appears normal.  She has a mild sustention tremor which is perhaps slightly worse on the left than the right.  I had her complete an Archimedes spiral today.  Her gait is unchanged.    Impression: 68 year old right-handed woman with a history of arteriovenous malformation, status post resection and residual seizure disorder.  She has not had a seizure in many years.  She remains on carbamazepine at the same dose and her levels are stable and therapeutic.    I plan on seeing her again in 6 months.  I see no neurologic contraindications to undergoing cataract surgery.  Will monitor her tremor for now.

## 2023-12-24 ENCOUNTER — Encounter: Payer: Self-pay | Admitting: Internal Medicine

## 2023-12-24 NOTE — OR Nursing (Signed)
 Called patient with pre-procedure instructions for tomorrow.   Patient informed of:   Time to arrive for procedure. 0715 Remain NPO past midnight.  Must have a ride home and a responsible adult to remain with them for 24 hours post procedure.  Confirmed blood thinner. Confirmed no breaks in taking blood thinner for 3+ weeks prior to procedure. Confirmed patient stopped all GLP-1s and GLP-2s for at least one week before procedure.

## 2023-12-24 NOTE — Anesthesia Preprocedure Evaluation (Signed)
Anesthesia Evaluation  Patient identified by MRN, date of birth, ID band Patient awake    Reviewed: Allergy & Precautions, NPO status , Patient's Chart, lab work & pertinent test results, reviewed documented beta blocker date and time   History of Anesthesia Complications (+) PONV and history of anesthetic complications  Airway Mallampati: II  TM Distance: >3 FB Neck ROM: Full    Dental  (+) Dental Advisory Given, Teeth Intact   Pulmonary neg pulmonary ROS   Pulmonary exam normal        Cardiovascular hypertension, Pt. on home beta blockers and Pt. on medications +CHF  + dysrhythmias Atrial Fibrillation and Supra Ventricular Tachycardia + pacemaker + Valvular Problems/Murmurs (s/p MVR) MR  Rhythm:Regular Rate:Normal + Systolic murmurs  '24 TTE - EF 55 to 60%. Grade II diastolic dysfunction (pseudonormalization). Right ventricular systolic function is mildly reduced. Left atrial size was mild to moderately dilated. Status post mitral valve repair. There is severe mitral regurgitation, eccentrically-directed. Mean gradient mildly elevated at 5 mmHg, likely due to high flow across MV with severe MR. Trivial AI. Aortic dilatation noted. There is mild dilatation of the ascending aorta, measuring 41 mm.     Neuro/Psych  Benign essential tremor  TIA negative psych ROS   GI/Hepatic negative GI ROS, Neg liver ROS,,,  Endo/Other  negative endocrine ROS    Renal/GU negative Renal ROS     Musculoskeletal  (+) Arthritis ,    Abdominal   Peds  Hematology negative hematology ROS (+)   Anesthesia Other Findings   Reproductive/Obstetrics                             Anesthesia Physical Anesthesia Plan  ASA: 4  Anesthesia Plan: MAC   Post-op Pain Management: Minimal or no pain anticipated   Induction:   PONV Risk Score and Plan: 3 and Propofol infusion and Treatment may vary due to age or medical  condition  Airway Management Planned: Nasal Cannula and Natural Airway  Additional Equipment: None  Intra-op Plan:   Post-operative Plan:   Informed Consent: I have reviewed the patients History and Physical, chart, labs and discussed the procedure including the risks, benefits and alternatives for the proposed anesthesia with the patient or authorized representative who has indicated his/her understanding and acceptance.       Plan Discussed with: CRNA and Anesthesiologist  Anesthesia Plan Comments:        Anesthesia Quick Evaluation

## 2023-12-25 ENCOUNTER — Ambulatory Visit (HOSPITAL_COMMUNITY)
Admission: RE | Admit: 2023-12-25 | Discharge: 2023-12-25 | Disposition: A | Discharge status: Home / Self Care | Payer: Medicare Other | Attending: Internal Medicine | Admitting: Internal Medicine

## 2023-12-25 ENCOUNTER — Other Ambulatory Visit: Payer: Self-pay

## 2023-12-25 ENCOUNTER — Ambulatory Visit (HOSPITAL_COMMUNITY): Payer: Medicare Other | Admitting: Anesthesiology

## 2023-12-25 ENCOUNTER — Ambulatory Visit (HOSPITAL_BASED_OUTPATIENT_CLINIC_OR_DEPARTMENT_OTHER)
Admission: RE | Admit: 2023-12-25 | Discharge: 2023-12-25 | Disposition: A | Discharge status: Home / Self Care | Payer: Medicare Other | Source: Ambulatory Visit | Attending: Internal Medicine | Admitting: Internal Medicine

## 2023-12-25 ENCOUNTER — Ambulatory Visit (HOSPITAL_BASED_OUTPATIENT_CLINIC_OR_DEPARTMENT_OTHER): Payer: Medicare Other | Admitting: Anesthesiology

## 2023-12-25 ENCOUNTER — Encounter (HOSPITAL_COMMUNITY)
Admission: RE | Disposition: A | Discharge status: Home / Self Care | Payer: Self-pay | Source: Home / Self Care | Attending: Internal Medicine

## 2023-12-25 ENCOUNTER — Encounter (HOSPITAL_COMMUNITY): Payer: Self-pay | Admitting: Internal Medicine

## 2023-12-25 DIAGNOSIS — Z79899 Other long term (current) drug therapy: Secondary | ICD-10-CM | POA: Insufficient documentation

## 2023-12-25 DIAGNOSIS — I480 Paroxysmal atrial fibrillation: Secondary | ICD-10-CM | POA: Insufficient documentation

## 2023-12-25 DIAGNOSIS — Z950 Presence of cardiac pacemaker: Secondary | ICD-10-CM | POA: Diagnosis not present

## 2023-12-25 DIAGNOSIS — I110 Hypertensive heart disease with heart failure: Secondary | ICD-10-CM

## 2023-12-25 DIAGNOSIS — I509 Heart failure, unspecified: Secondary | ICD-10-CM | POA: Insufficient documentation

## 2023-12-25 DIAGNOSIS — I4891 Unspecified atrial fibrillation: Secondary | ICD-10-CM | POA: Diagnosis not present

## 2023-12-25 DIAGNOSIS — I700 Atherosclerosis of aorta: Secondary | ICD-10-CM | POA: Insufficient documentation

## 2023-12-25 DIAGNOSIS — I471 Supraventricular tachycardia, unspecified: Secondary | ICD-10-CM | POA: Diagnosis not present

## 2023-12-25 DIAGNOSIS — E785 Hyperlipidemia, unspecified: Secondary | ICD-10-CM | POA: Diagnosis not present

## 2023-12-25 DIAGNOSIS — I495 Sick sinus syndrome: Secondary | ICD-10-CM | POA: Insufficient documentation

## 2023-12-25 DIAGNOSIS — I340 Nonrheumatic mitral (valve) insufficiency: Secondary | ICD-10-CM | POA: Diagnosis not present

## 2023-12-25 DIAGNOSIS — I080 Rheumatic disorders of both mitral and aortic valves: Secondary | ICD-10-CM | POA: Insufficient documentation

## 2023-12-25 DIAGNOSIS — I349 Nonrheumatic mitral valve disorder, unspecified: Secondary | ICD-10-CM

## 2023-12-25 DIAGNOSIS — Z9889 Other specified postprocedural states: Secondary | ICD-10-CM

## 2023-12-25 HISTORY — PX: TRANSESOPHAGEAL ECHOCARDIOGRAM (CATH LAB): EP1270

## 2023-12-25 SURGERY — TRANSESOPHAGEAL ECHOCARDIOGRAM (TEE) (CATHLAB)
Anesthesia: Monitor Anesthesia Care

## 2023-12-25 MED ORDER — AMLODIPINE BESYLATE 5 MG PO TABS: 5.0000 mg | ORAL_TABLET | Freq: Every day | ORAL | 11 refills | Status: DC

## 2023-12-25 MED ORDER — SODIUM CHLORIDE 0.9 % IV SOLN
INTRAVENOUS | Status: DC
Start: 2023-12-25 — End: 2023-12-25

## 2023-12-25 MED ORDER — PROPOFOL 500 MG/50ML IV EMUL
INTRAVENOUS | Status: DC | PRN
  Administered 2023-12-25: 250 ug/kg/min via INTRAVENOUS
  Administered 2023-12-25 (×2): 50 mg via INTRAVENOUS

## 2023-12-25 NOTE — Anesthesia Procedure Notes (Signed)
 Procedure Name: MAC Date/Time: 12/25/2023 8:07 AM  Performed by: Crisoforo Burnard KIDD, CRNAPre-anesthesia Checklist: Patient identified, Emergency Drugs available, Suction available, Patient being monitored and Timeout performed Patient Re-evaluated:Patient Re-evaluated prior to induction Preoxygenation: Pre-oxygenation with 100% oxygen Induction Type: IV induction Dental Injury: Teeth and Oropharynx as per pre-operative assessment

## 2023-12-25 NOTE — Anesthesia Postprocedure Evaluation (Signed)
 Anesthesia Post Note  Patient: Ariel Simpson  Procedure(s) Performed: TRANSESOPHAGEAL ECHOCARDIOGRAM     Patient location during evaluation: PACU Anesthesia Type: MAC Level of consciousness: awake and alert Pain management: pain level controlled Vital Signs Assessment: post-procedure vital signs reviewed and stable Respiratory status: spontaneous breathing, nonlabored ventilation and respiratory function stable Cardiovascular status: stable and blood pressure returned to baseline Anesthetic complications: no   No notable events documented.  Last Vitals:  Vitals:   12/25/23 0902 12/25/23 0912  BP: (!) 132/98 137/84  Pulse: 61 60  Resp: 17 15  Temp:    SpO2: 100% 98%    Last Pain:  Vitals:   12/25/23 0912  TempSrc:   PainSc: 0-No pain                 [name] Like

## 2023-12-25 NOTE — Discharge Instructions (Signed)

## 2023-12-25 NOTE — CV Procedure (Signed)
 INDICATIONS: Prosthetic mitral valve regurgitation  PROCEDURE:   Informed consent was obtained prior to the procedure. The risks, benefits and alternatives for the procedure were discussed and the patient comprehended these risks.  Risks include, but are not limited to, cough, sore throat, vomiting, nausea, somnolence, esophageal and stomach trauma or perforation, bleeding, low blood pressure, aspiration, pneumonia, infection, trauma to the teeth and death.    After a procedural time-out, the oropharynx was anesthetized with 20% benzocaine spray.   During this procedure the patient was administered propofol  per anesthesia.  The patient's heart rate, blood pressure, and oxygen saturation were monitored continuously during the procedure. The period of conscious sedation was 25 minutes, of which I was present face-to-face 100% of this time.  The transesophageal probe was inserted in the esophagus and stomach without difficulty and multiple views were obtained.  The patient was kept under observation until the patient left the procedure room.  The patient left the procedure room in stable condition.   Agitated microbubble saline contrast was administered.  COMPLICATIONS:    There were no immediate complications.  FINDINGS:   FORMAL ECHOCARDIOGRAM REPORT PENDING Normal biventricular function Sorin memo 3d 30 mm ring in mitral position with severe MR at ambulatory BP. PV blunting and reversal noted at BP 126/88. At lower BP, MR appears more moderate.  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 No thrombus seen in LAA.  Mild TR, Mild AI, trivial PI. No IAS, negative for PFO.   RECOMMENDATIONS:    Will discuss BP control with patient to mitigate MR.   Time Spent Directly with the Patient:  45 minutes   Alyas Creary A Lajuane Leatham 12/25/2023, 8:49 AM

## 2023-12-25 NOTE — Interval H&P Note (Signed)
 History and Physical Interval Note:  12/25/2023 7:49 AM  Ariel Simpson  has presented today for surgery, with the diagnosis of MITRAL VALVE DISORDER.  The various methods of treatment have been discussed with the patient and family. After consideration of risks, benefits and other options for treatment, the patient has consented to  Procedure(s): TRANSESOPHAGEAL ECHOCARDIOGRAM (N/A) as a surgical intervention.  The patient's history has been reviewed, patient examined, no change in status, stable for surgery.  I have reviewed the patient's chart and labs.  Questions were answered to the patient's satisfaction.     Ariel Simpson

## 2023-12-25 NOTE — Transfer of Care (Signed)
 Immediate Anesthesia Transfer of Care Note  Patient: Ariel Simpson  Procedure(s) Performed: TRANSESOPHAGEAL ECHOCARDIOGRAM  Patient Location: Cath Lab  Anesthesia Type:MAC  Level of Consciousness: awake, alert , and oriented  Airway & Oxygen Therapy: Patient Spontanous Breathing  Post-op Assessment: Report given to RN, Post -op Vital signs reviewed and stable, Patient moving all extremities X 4, and Patient able to stick tongue midline  Post vital signs: Reviewed and stable  Last Vitals:  Vitals Value Taken Time  BP 139/83 12/25/23 0852  Temp 36.4 C 12/25/23 0852  Pulse 61 12/25/23 0853  Resp 19 12/25/23 0853  SpO2 97 % 12/25/23 0853  Vitals shown include unfiled device data.  Last Pain:  Vitals:   12/25/23 0852  TempSrc: Tympanic  PainSc: Asleep         Complications: No notable events documented.

## 2023-12-26 LAB — ECHO TEE
MV M vel: 5.07 m/s
MV Peak grad: 102.8 mm[Hg]
P 1/2 time: 841 ms
Radius: 0.8 cm

## 2024-01-01 NOTE — Progress Notes (Deleted)
 CC: Osteoporosis follow up     [name] is a 68 y.o. female presenting for osteoporosis care.  She is transferring her Prolia administration to [facility] from the [employer] [facility].    Medication history: Bisphosphonates in the past  Forteo  Prolia through [employer] Prolia injection [facility] 01/08/2024    Fracture history: Bilateral wrist, right foot      Servings of calcium in daily diet:       Calcium supplement: Calcium carbonate plus D6 100 mg - 200 units 3 times a day      Vitamin D supplement: D3 1000 units a day      Weightbearing exercise:       Of systemic steroids, anticonvulsants, lasix, PPIs and hormone deprivation therapy, pt takes: Tegretol    DEXA:   DEXA scan was personally reviewed by me and discussed with the patient.     [facility]  Date: 07/25/2022  Comparison: 03/10/2020  Site T-score % change since previous   Spine vertebra L1-L4 -2.3 -2.2 (-1.4%)   Left femoral neck -3.4    Right femoral neck -3.9    Left total hip -3.0 -1.6%   Right total hip  -3.4 +0.7%   FRAX probability of fracture: 39.9% for major osteoporotic fracture and 22.3% for hip fracture    Past Medical History:   Diagnosis Date    Contusion of foot 06/02/2013    Hypertension     Hypothyroidism     Osteoporosis     Seizure     Stroke        ROS reviewed and pertinent positives noted in HPI.     Labs:       Lab results: 11/03/23  1225   Sodium 140   Potassium 4.3   Chloride 98   CO2 28   UN 20   Creatinine 0.61   Glucose 93   Calcium 9.8           Lab results: 11/03/23  1225   25-OH Vit Total 47        No results found for requested labs within last 1095 days.       Physical exam:   Patient is alert and oriented with equal pupils and symmetric facial movements. Breathing is normal and nonlabored. Vitals are within acceptable limits.     Assessment/plan:   Patient has osteoporosis. Labs are acceptable. I recommended that patient receive Prolia today. We discussed administration, risks and  benefits and patient was invited to ask questions which were then answered. Patient elected to receive injection. Patient denies current infection and major dental work +/- 3 months. Prolia injection given today. Continue with calcium and vitamin D regimen, along with weightbearing exercises. Follow up in 6 months with new calcium, creatinine, Vitamin D, and urine NTX.     Next Dexa Scan:    Prolia 60mg  SQ given today by me in left arm.  LOT  EXP       Lowella Grip ACNP-C  Senior NP  [facility]

## 2024-01-02 ENCOUNTER — Other Ambulatory Visit: Payer: Self-pay | Admitting: Primary Care

## 2024-01-02 ENCOUNTER — Telehealth: Payer: Self-pay | Admitting: Primary Care

## 2024-01-02 ENCOUNTER — Encounter: Payer: Self-pay | Admitting: Gastroenterology

## 2024-01-02 DIAGNOSIS — E785 Hyperlipidemia, unspecified: Secondary | ICD-10-CM

## 2024-01-02 NOTE — Telephone Encounter (Signed)
Completed, in my outbox, pls mail to pt

## 2024-01-02 NOTE — Telephone Encounter (Signed)
Patient's handicap sticker is expired and she is requesting a new form to take to town hall with her inability to walk far distances.       Parking permit form placed on PCP desk

## 2024-01-07 NOTE — Progress Notes (Deleted)
 Outpatient Visit      Patient name: [name]  DOB: Feb 08, 1956       Age: 68 y.o.  MR#: [id number]    Encounter Date: 01/23/2024    Subjective:    No chief complaint on file.        has a current medication list which includes the following prescription(s): atorvastatin, amlodipine, carbamazepine, triamcinolone, solifenacin, fluticasone, fluticasone, levothyroxine, generic dme, carbamazepine, azelastine, econazole nitrate, rabeprazole, generic dme, guaifenesin, generic dme, metronidazole, famotidine, pseudoephedrine, [facility]vs 12 hour nasal decongestant, non-system medication, calcium carbonate-cholecalciferol, denosumab, loratadine, multivitamin, and cholecalciferol.     is allergic to penicillins, sulfa antibiotics, bactrim, and seasonal allergies.      Past Medical History:   Diagnosis Date    Contusion of foot 06/02/2013    Hypertension     Hypothyroidism     Osteoporosis     Seizure     Stroke       Past Surgical History:   Procedure Laterality Date    CONVERTED PROCEDURE      Craniotomy A-V Malformation Repair Conversion Data     HX TONSILLECTOMY/ADENOIDECTOMY      Tonsillectomy Conversion Data     TONSILLECTOMY          Specialty Problems    None            Objective:   Not recorded                 No annotated images are attached to the encounter.      Assessment/Plan:     1. Combined forms of age-related cataract, bilateral        2. Choroidal nevus of right eye        3. Dermatochalasis of both upper eyelids        4. Myopia, bilateral        5. Regular astigmatism, bilateral        6. Presbyopia             Assessment/Plan:  1. Visually significant cataract  both eyes  affecting activities of daily living  -Patient states that having difficulty with driving, reading, and night vision  -We will plan cataract extraction with intraocular lens placement using local, topical anesthesia in conjunction with monitored anesthesia care (MAC). There is a slight possibility that General Anesthesia may be necessary.    -The patient has elected to proceed,  left eye  first  -After a detailed discussion, the patient would like to aim for [facility] } as a post-surgical refractive target    -Discussed R/B/A CE/PICOL including the risks of endophthalmitis, retinal detachment, choroidal hemorrhage, anterior and posterior capsule tears, retained lens fragment, lens placement in sulcus or anterior chamber, vitreous loss, anterior vitrectomy, corneal decompensation, ptosis, CME, PCO requiring Nd:YAG capsulotomy, need for glasses after surgery, need for further surgery, and risks of anesthesia and patient the wishes to proceed with surgery.  -Pt understands that no lens exists that can guarantee that pt is totally glasses independent.    -Although the patient has choroidal nevus, dermatochalasis, myopia, astigmatism, presbyopia, I believe cataract extraction surgery will benefit the patient's vision.    Plan:  Plan for CE surgery for  both eyes  first  Informed consents was taken for  left eye today.  Patient was seen and examined.  Findings, assessment, and plan were discussed in detail with the patient, who verbalized understanding of and agreement with plan. All questions were answered. The patient was instructed to call  or come in if there are any new symptoms or existing symptoms persist or worsen. To call if questions or concerns: [phone], number provided.   Plan for co-management with Dr. Alycia Rossetti. Follow up was arranged.     Russ Halo, MD

## 2024-01-07 NOTE — Patient Instructions (Incomplete)
Cataracts   WHAT YOU NEED TO KNOW:   What are cataracts?  A cataract is a clouding of the eye lens. The lens works with the cornea to bend light as it passes through to the retina. It is normally clear. A cloudy lens makes it hard for light to pass through. Your vision may be cloudy, hazy, and blurred. You may develop a cataract in one or both eyes.     What increases my risk for cataracts?   Age 70 years or older     A medical condition such as diabetes, low blood calcium, or high blood pressure     Your eyes being exposed to sunlight or x-rays     Long-term steroid use     Alcohol use, or smoking cigarettes     Family history of cataracts     What are the signs and symptoms of cataracts?   Increasing loss of vision sharpness     Cloudy, foggy, fuzzy, or hazy blurring of vision     A halo appears around lights     Headlights and sun may seem very bright     Double vision     Colors appear faded     Loss of vision     How are cataracts diagnosed?  Your healthcare provider may notice a cloudy appearance when looking at your eyes. He or she may also do any of the following:  A visual acuity test  is used to check your vision, eye pressure, and eye movements.     Ophthalmoscopy  is used to see the back of your eyes. Eyedrops may be used to dilate your pupils.     A slit-lamp test  is used to look into your eye with a microscope with a strong light.        How are cataracts treated?  You may need a different prescription for your glasses in the early stages of cataracts. Cataract lens removal surgery is a very common surgery. Your lens will be removed and replaced with an artificial lens.  How can I protect my eyes?   Wear sunglasses  to protect your eyes from the sunlight and prevent eye discomfort. Make sure the sunglasses have UV protection.     Do not smoke.  Cigarette smoking increases your risk for cataracts. Ask your healthcare provider for information if you currently smoke and need help to quit. E-cigarettes or  smokeless tobacco still contain nicotine. Talk to your healthcare provider before you use these products.     When should I seek immediate care?   You suddenly lose your eyesight.     You feel a sudden, sharp pain in your eye.     When should I call my doctor?   You have questions or concerns about your condition or care.        CARE AGREEMENT:   You have the right to help plan your care. Learn about your health condition and how it may be treated. Discuss treatment options with your healthcare providers to decide what care you want to receive. You always have the right to refuse treatment. The above information is an educational aid only. It is not intended as medical advice for individual conditions or treatments. Talk to your doctor, nurse or pharmacist before following any medical regimen to see if it is safe and effective for you.  © Copyright [employer] 2020 Information is for End User's use only and may not be sold, redistributed or   otherwise used for commercial purposes. All illustrations and images included in CareNotes® are the copyrighted property of A.D.A.M., Inc. or [employer]

## 2024-01-08 ENCOUNTER — Ambulatory Visit: Payer: Medicare Other | Admitting: Orthopedic Surgery

## 2024-01-14 ENCOUNTER — Other Ambulatory Visit: Payer: Self-pay | Admitting: Primary Care

## 2024-01-14 DIAGNOSIS — Z2839 Other underimmunization status: Secondary | ICD-10-CM

## 2024-01-15 ENCOUNTER — Telehealth: Payer: Self-pay | Admitting: Ophthalmology

## 2024-01-15 NOTE — Telephone Encounter (Signed)
 01/15/2024    I wanted you to be aware that the following patient has cancelled their appointment.    Provider Name: Dr. Remigio Eisenmenger    Reason for Cancellation: Patient requested to push appointment out until May 2025.    Patient Rescheduled (Date/Time): 03/26/24 at 8:30am.    Patient did not reschedule, will call back N/A   Patient does not want to reschedule N/A   Patient aware of change of location from original scheduled appointment N/A   Date of Appointment: 01/23/24.    Patient Name: [name]   MRN: [id number]   DOB: [birth year]       Thank you,  Kandy Garrison

## 2024-01-18 ENCOUNTER — Telehealth: Payer: Self-pay | Admitting: Primary Care

## 2024-01-18 DIAGNOSIS — E7800 Pure hypercholesterolemia, unspecified: Secondary | ICD-10-CM

## 2024-01-18 DIAGNOSIS — G40909 Epilepsy, unspecified, not intractable, without status epilepticus: Secondary | ICD-10-CM

## 2024-01-18 DIAGNOSIS — E559 Vitamin D deficiency, unspecified: Secondary | ICD-10-CM

## 2024-01-18 DIAGNOSIS — E034 Atrophy of thyroid (acquired): Secondary | ICD-10-CM

## 2024-01-18 DIAGNOSIS — K219 Gastro-esophageal reflux disease without esophagitis: Secondary | ICD-10-CM

## 2024-01-18 NOTE — Telephone Encounter (Signed)
 Patient called wondering if there was routine blood work needed for 01/24/24 appointment. Writer advised patient that there were orders for MMR titers, but nothing else at this time. Advised patient that PCP would be in office on 01/21/24 and patient would be notified then if additional labs were bing ordered. Patient voiced understanding and stated she would call on 01/21/24 if she had not heard from this office. Timoteo Gaul, LPN 1/32/4401 0:27 PM

## 2024-01-20 NOTE — Progress Notes (Unsigned)
  Electrophysiology Office Note:   ID:  Aliyanah, Rozas 02-07-1956, MRN [id number]  Primary Cardiologist: Thurmon Fair, MD Electrophysiologist: Lewayne Bunting, MD  {Click to update primary MD,subspecialty MD or APP then REFRESH:1}    History of Present Illness:   KORINNA TAT is a 68 y.o. female with h/o MV repair and MAZE, SND, HTN, HLD, and Pacemaker implant seen today for routine electrophysiology followup.   Since last being seen in our clinic the patient reports doing ***.  she denies chest pain, palpitations, dyspnea, PND, orthopnea, nausea, vomiting, dizziness, syncope, edema, weight gain, or early satiety.   Review of systems complete and found to be negative unless listed in HPI.   EP Information / Studies Reviewed:    EKG is not ordered today. EKG from 12/14/2023 reviewed which showed A paced rhythm 67 bpm       PPM Interrogation-  reviewed in detail today,  See PACEART report.  Arrhythmia/Device History Dual Chamber Medtronic PPM 09/2022 for h/o SSS and pauses.    Physical Exam:   VS:  There were no vitals taken for this visit.   Wt Readings from Last 3 Encounters:  12/25/23 168 lb (76.2 kg)  12/14/23 174 lb 9.6 oz (79.2 kg)  10/26/23 168 lb (76.2 kg)      GEN: No acute distress  NECK: No JVD; No carotid bruits CARDIAC: {EPRHYTHM:28826}, no murmurs, rubs, gallops RESPIRATORY:  Clear to auscultation without rales, wheezing or rhonchi  ABDOMEN: Soft, non-tender, non-distended EXTREMITIES:  {EDEMA LEVEL:28147::"No"} edema; No deformity   ASSESSMENT AND PLAN:    SND s/p Medtronic PPM  Normal PPM function See Pace Art report No changes today  Paroxsymal AF Maze at time of MR surgery, low burden overall and none on PPM Not on OAC.   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    {Click here to Review PMH, Prob List, Meds, Allergies, SHx, FHx  :1}   Disposition:    Follow up with {EPPROVIDERS:28135} {EPFOLLOW UP:28173}  Signed, Graciella Freer, PA-C

## 2024-01-21 ENCOUNTER — Encounter: Payer: Self-pay | Admitting: Student

## 2024-01-21 ENCOUNTER — Ambulatory Visit: Payer: Medicare Other | Attending: Student | Admitting: Student

## 2024-01-21 VITALS — BP 116/74 | HR 83 | Ht 66.0 in | Wt 172.0 lb

## 2024-01-21 DIAGNOSIS — I495 Sick sinus syndrome: Secondary | ICD-10-CM

## 2024-01-21 DIAGNOSIS — I10 Essential (primary) hypertension: Secondary | ICD-10-CM | POA: Diagnosis not present

## 2024-01-21 DIAGNOSIS — Z9889 Other specified postprocedural states: Secondary | ICD-10-CM | POA: Diagnosis not present

## 2024-01-21 DIAGNOSIS — I471 Supraventricular tachycardia, unspecified: Secondary | ICD-10-CM

## 2024-01-21 DIAGNOSIS — I480 Paroxysmal atrial fibrillation: Secondary | ICD-10-CM | POA: Diagnosis not present

## 2024-01-21 DIAGNOSIS — Z950 Presence of cardiac pacemaker: Secondary | ICD-10-CM | POA: Diagnosis not present

## 2024-01-21 LAB — CUP PACEART INCLINIC DEVICE CHECK
Battery Remaining Longevity: 153 mo
Battery Voltage: 3.05 V
Brady Statistic AP VP Percent: 0.03 %
Brady Statistic AP VS Percent: 85.22 %
Brady Statistic AS VP Percent: 0.01 %
Brady Statistic AS VS Percent: 14.75 %
Brady Statistic RA Percent Paced: 85.45 %
Brady Statistic RV Percent Paced: 0.04 %
Date Time Interrogation Session: 20250303094152
Implantable Lead Connection Status: 753985
Implantable Lead Connection Status: 753985
Implantable Lead Implant Date: 20231101
Implantable Lead Implant Date: 20231101
Implantable Lead Location: 753859
Implantable Lead Location: 753860
Implantable Lead Model: 3830
Implantable Lead Model: 5076
Implantable Pulse Generator Implant Date: 20231101
Lead Channel Impedance Value: 361 Ohm
Lead Channel Impedance Value: 380 Ohm
Lead Channel Impedance Value: 551 Ohm
Lead Channel Impedance Value: 570 Ohm
Lead Channel Pacing Threshold Amplitude: 0.75 V
Lead Channel Pacing Threshold Amplitude: 0.875 V
Lead Channel Pacing Threshold Pulse Width: 0.4 ms
Lead Channel Pacing Threshold Pulse Width: 0.4 ms
Lead Channel Sensing Intrinsic Amplitude: 1.625 mV
Lead Channel Sensing Intrinsic Amplitude: 16.25 mV
Lead Channel Sensing Intrinsic Amplitude: 17.75 mV
Lead Channel Sensing Intrinsic Amplitude: 2.375 mV
Lead Channel Setting Pacing Amplitude: 1.5 V
Lead Channel Setting Pacing Amplitude: 2 V
Lead Channel Setting Pacing Pulse Width: 0.4 ms
Lead Channel Setting Sensing Sensitivity: 1.2 mV
Zone Setting Status: 755011

## 2024-01-21 NOTE — Progress Notes (Deleted)
 CC: Osteoporosis follow up     [name] is a 68 y.o. female presenting for osteoporosis care.  She is transferring her Prolia administration to [facility] from the [employer] group.    Medication history: Bisphosphonates in the past  Forteo  Prolia through [employer] Prolia injection [facility] 01/08/2024    Fracture history: Bilateral wrist, right foot      Servings of calcium in daily diet:       Calcium supplement: Calcium carbonate plus D6 100 mg - 200 units 3 times a day      Vitamin D supplement: D3 1000 units a day      Weightbearing exercise:       Of systemic steroids, anticonvulsants, lasix, PPIs and hormone deprivation therapy, pt takes: Tegretol    DEXA:   DEXA scan was personally reviewed by me and discussed with the patient.     [facility]  Date: 07/25/2022  Comparison: 03/10/2020  Site T-score % change since previous   Spine vertebra L1-L4 -2.3 -2.2 (-1.4%)   Left femoral neck -3.4    Right femoral neck -3.9    Left total hip -3.0 -1.6%   Right total hip  -3.4 +0.7%   FRAX probability of fracture: 39.9% for major osteoporotic fracture and 22.3% for hip fracture    Past Medical History:   Diagnosis Date    Contusion of foot 06/02/2013    Hypertension     Hypothyroidism     Osteoporosis     Seizure     Stroke        ROS reviewed and pertinent positives noted in HPI.     Labs:       Lab results: 11/03/23  1225   Sodium 140   Potassium 4.3   Chloride 98   CO2 28   UN 20   Creatinine 0.61   Glucose 93   Calcium 9.8           Lab results: 11/03/23  1225   25-OH Vit Total 47        No results found for requested labs within last 1095 days.       Physical exam:   Patient is alert and oriented with equal pupils and symmetric facial movements. Breathing is normal and nonlabored. Vitals are within acceptable limits.     Assessment/plan:   Patient has osteoporosis. Labs are acceptable. I recommended that patient receive Prolia today. We discussed administration, risks and  benefits and patient was invited to ask questions which were then answered. Patient elected to receive injection. Patient denies current infection and major dental work +/- 3 months. Prolia injection given today. Continue with calcium and vitamin D regimen, along with weightbearing exercises. Follow up in 6 months with new calcium, creatinine, Vitamin D, and urine NTX.     Next Dexa Scan: September 2025    Prolia 60mg  SQ given today by me in left arm.  LOT  EXP       Lowella Grip ACNP-C  Senior NP  [facility]

## 2024-01-21 NOTE — Telephone Encounter (Signed)
 You can let her know that her lab order is in

## 2024-01-21 NOTE — Telephone Encounter (Signed)
 Pt.notified

## 2024-01-21 NOTE — Patient Instructions (Addendum)
 Medication Instructions:  Your physician recommends that you continue on your current medications as directed. Please refer to the Current Medication list given to you today.  *If you need a refill on your cardiac medications before your next appointment, please call your pharmacy*   Lab Work: None ordered   Testing/Procedures: None ordered   Follow-Up: At [facility], you and your health needs are our priority.  As part of our continuing mission to provide you with exceptional heart care, we have created designated Provider Care Teams.  These Care Teams include your primary Cardiologist (physician) and Advanced Practice Providers (APPs -  Physician Assistants and Nurse Practitioners) who all work together to provide you with the care you need, when you need it.  Your next appointment:   1 year(s)  The format for your next appointment:   In Person  Provider:   You may see Lewayne Bunting, MD or one of the following Advanced Practice Providers on your designated Care Team:   Francis Dowse, [place] Casimiro Needle "Mardelle Matte" [place], [place] Sherie Don, NP Canary Brim, NP{  Thank you for choosing [facility]!!   [phone]

## 2024-01-22 ENCOUNTER — Other Ambulatory Visit
Admission: RE | Admit: 2024-01-22 | Discharge: 2024-01-22 | Disposition: A | Discharge status: Home / Self Care | Source: Ambulatory Visit | Attending: Primary Care | Admitting: Primary Care

## 2024-01-22 DIAGNOSIS — G40909 Epilepsy, unspecified, not intractable, without status epilepticus: Secondary | ICD-10-CM | POA: Insufficient documentation

## 2024-01-22 DIAGNOSIS — E034 Atrophy of thyroid (acquired): Secondary | ICD-10-CM | POA: Insufficient documentation

## 2024-01-22 DIAGNOSIS — Z2839 Other underimmunization status: Secondary | ICD-10-CM | POA: Insufficient documentation

## 2024-01-22 DIAGNOSIS — E559 Vitamin D deficiency, unspecified: Secondary | ICD-10-CM | POA: Insufficient documentation

## 2024-01-22 DIAGNOSIS — E7800 Pure hypercholesterolemia, unspecified: Secondary | ICD-10-CM | POA: Insufficient documentation

## 2024-01-22 LAB — CBC
Hematocrit: 38 % (ref 34–49)
Hemoglobin: 12.2 g/dL (ref 11.2–16.0)
MCV: 101 fL — ABNORMAL HIGH (ref 75–100)
Platelets: 232 10*3/uL (ref 150–450)
RBC: 3.8 MIL/uL — ABNORMAL LOW (ref 4.0–5.5)
RDW: 12.9 % (ref 0.0–15.0)
WBC: 5.3 10*3/uL (ref 3.5–11.0)

## 2024-01-22 LAB — COMPREHENSIVE METABOLIC PANEL
ALT: 21 U/L (ref 0–35)
AST: 20 U/L (ref 0–35)
Albumin: 4.4 g/dL (ref 3.5–5.2)
Alk Phos: 99 U/L (ref 35–105)
Anion Gap: 9 (ref 7–16)
Bilirubin,Total: 0.3 mg/dL (ref 0.0–1.2)
CO2: 29 mmol/L — ABNORMAL HIGH (ref 20–28)
Calcium: 9.4 mg/dL (ref 8.6–10.2)
Chloride: 102 mmol/L (ref 96–108)
Creatinine: 0.55 mg/dL (ref 0.51–0.95)
Glucose: 89 mg/dL (ref 60–99)
Lab: 14 mg/dL (ref 6–20)
Potassium: 5 mmol/L (ref 3.3–5.1)
Sodium: 140 mmol/L (ref 133–145)
Total Protein: 6.9 g/dL (ref 6.3–7.7)
eGFR BY CREAT: 98 *

## 2024-01-22 LAB — VITAMIN D: 25-OH Vit Total: 49 ng/mL (ref 30–60)

## 2024-01-22 LAB — LIPID PANEL
Chol/HDL Ratio: 2
Cholesterol: 196 mg/dL
HDL: 99 mg/dL — ABNORMAL HIGH (ref 40–60)
LDL Calculated: 80 mg/dL
Non HDL Cholesterol: 97 mg/dL
Triglycerides: 98 mg/dL

## 2024-01-22 LAB — MUMPS ANTIBODY, IGG: Mumps IgG: 6.4

## 2024-01-22 LAB — TSH: TSH: 1.28 u[IU]/mL (ref 0.27–4.20)

## 2024-01-22 LAB — MEASLES IGG AB: Measles IgG: 7.4

## 2024-01-22 LAB — RUBELLA ANTIBODY, IGG: Rubella IgG AB: 3.8

## 2024-01-23 ENCOUNTER — Ambulatory Visit: Payer: Medicare Other | Admitting: Ophthalmology

## 2024-01-23 ENCOUNTER — Other Ambulatory Visit: Payer: Self-pay | Admitting: Internal Medicine

## 2024-01-23 DIAGNOSIS — H5213 Myopia, bilateral: Secondary | ICD-10-CM

## 2024-01-23 DIAGNOSIS — H524 Presbyopia: Secondary | ICD-10-CM

## 2024-01-23 DIAGNOSIS — H02831 Dermatochalasis of right upper eyelid: Secondary | ICD-10-CM

## 2024-01-23 DIAGNOSIS — H25813 Combined forms of age-related cataract, bilateral: Secondary | ICD-10-CM

## 2024-01-23 DIAGNOSIS — H52223 Regular astigmatism, bilateral: Secondary | ICD-10-CM

## 2024-01-23 DIAGNOSIS — D3131 Benign neoplasm of right choroid: Secondary | ICD-10-CM

## 2024-01-24 ENCOUNTER — Other Ambulatory Visit: Payer: Self-pay

## 2024-01-24 ENCOUNTER — Ambulatory Visit: Payer: Medicare Other | Attending: Primary Care | Admitting: Primary Care

## 2024-01-24 VITALS — BP 120/62 | HR 76 | Ht 62.99 in | Wt 112.8 lb

## 2024-01-24 DIAGNOSIS — E7800 Pure hypercholesterolemia, unspecified: Secondary | ICD-10-CM | POA: Insufficient documentation

## 2024-01-24 DIAGNOSIS — M25541 Pain in joints of right hand: Secondary | ICD-10-CM | POA: Insufficient documentation

## 2024-01-24 DIAGNOSIS — G40909 Epilepsy, unspecified, not intractable, without status epilepticus: Secondary | ICD-10-CM | POA: Insufficient documentation

## 2024-01-24 DIAGNOSIS — I10 Essential (primary) hypertension: Secondary | ICD-10-CM | POA: Insufficient documentation

## 2024-01-24 DIAGNOSIS — R3915 Urgency of urination: Secondary | ICD-10-CM | POA: Insufficient documentation

## 2024-01-24 MED ORDER — MIRABEGRON ER 25 MG PO TB24 *I*
25.0000 mg | ORAL_TABLET | Freq: Every day | ORAL | 5 refills | Status: DC
Start: 2024-01-24 — End: 2024-05-12

## 2024-01-24 NOTE — Progress Notes (Signed)
 Reason for Visit: Follow-up    Sinuses are okay  Right index and fifth PIP have been quite painful especially if her hand hits up against something  She has to keep adjusting her sneakers, multple times per hour she has to tighten and re-tie them d/t her narrow foot    Continues to work on PT exercises to strengthen the feet    Frequent urination remains an annoyance.  I looked back and she did not previously try myrbetriq, because insurance would nto cover  She gets up two to three times at night  She has a conditioned response, as soon as she gets close to the house, the urgency is bad  She does typically empty the bladder once per hour, two hours max    GERD    She tried the apple hearing aid and it didn't fit well  Is hoping to try one from [employer]   Has had trouble finding one that fits and is comfortable in the ear.     Recent Results (from the past 2 weeks)   Carbamazepine, free & total    Collection Time: 01/22/24  9:50 AM   Result Value Ref Range    Carbamazepine,Free 2.2 1.0 - 3.0 ug/mL    Carbamazepine, Free 18.8 8.0 - 35.0 %    Carbamazepine,Total 11.7 4.0 - 12.0 ug/mL   Lipid Panel (Reflex to Direct  LDL if Triglycerides more than 400)    Collection Time: 01/22/24  9:50 AM   Result Value Ref Range    Cholesterol 196 mg/dL    Triglycerides 98 mg/dL    HDL 99 (H) 40 - 60 mg/dL    LDL Calculated 80 mg/dL    Non HDL Cholesterol 97 mg/dL    Chol/HDL Ratio 2.0    Comprehensive metabolic panel    Collection Time: 01/22/24  9:50 AM   Result Value Ref Range    Sodium 140 133 - 145 mmol/L    Potassium 5.0 3.3 - 5.1 mmol/L    Chloride 102 96 - 108 mmol/L    CO2 29 (H) 20 - 28 mmol/L    Anion Gap 9 7 - 16    UN 14 6 - 20 mg/dL    Creatinine 5.36 6.44 - 0.95 mg/dL    eGFR BY CREAT 98 *    Glucose 89 60 - 99 mg/dL    Calcium 9.4 8.6 - 03.4 mg/dL    Total Protein 6.9 6.3 - 7.7 g/dL    Albumin 4.4 3.5 - 5.2 g/dL    Bilirubin,Total 0.3 0.0 - 1.2 mg/dL    AST 20 0 - 35 U/L    ALT 21 0 - 35 U/L    Alk Phos 99 35 - 105 U/L    CBC    Collection Time: 01/22/24  9:50 AM   Result Value Ref Range    WBC 5.3 3.5 - 11.0 THOU/uL    RBC 3.8 (L) 4.0 - 5.5 MIL/uL    Hemoglobin 12.2 11.2 - 16.0 g/dL    Hematocrit 38 34 - 49 %    MCV 101 (H) 75 - 100 fL    RDW 12.9 0.0 - 15.0 %    Platelets 232 150 - 450 THOU/uL   TSH    Collection Time: 01/22/24  9:50 AM   Result Value Ref Range    TSH 1.28 0.27 - 4.20 uIU/mL   Vitamin D    Collection Time: 01/22/24  9:50 AM   Result Value Ref Range  25-OH Vit Total 49 30 - 60 ng/mL   Rubella antibody, IgG    Collection Time: 01/22/24  9:50 AM   Result Value Ref Range    Rubella IgG AB 3.8    Mumps antibody, IgG    Collection Time: 01/22/24  9:50 AM   Result Value Ref Range    Mumps IgG 6.4    Measles IgG antibody    Collection Time: 01/22/24  9:50 AM   Result Value Ref Range    Measles IgG 7.4           Patient Active Problem List   Diagnosis Code    Reported Trauma Neck     Allergic rhinitis J30.9    Irritable bowel syndrome K58.9    Essential hypertension I10    Dysplastic Nevus I78.1    Rosacea L71.9    Osteoporosis M81.0    Hypothyroidism E03.9    Scoliosis M41.20    Urge Incontinence Of Urine N39.41    A-V Malformation Repair Supratentorial Complex     Seizure disorder G40.909    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    Ringing In The Ears (Tinnitus) H93.19    GERD (gastroesophageal reflux disease) K21.9    Pain in joint, ankle and foot M25.579    Weakness of foot R29.898    Hyperlipidemia E78.5    Right ankle pain M25.571     Current Outpatient Medications   Medication    atorvastatin (LIPITOR) 10 mg tablet    amLODIPine (NORVASC) 5 mg tablet    carBAMazepine (TEGRETOL XR) 200 mg 12 hr tablet    triamcinolone (KENALOG) 0.1 % ointment    solifenacin (VESICARE) 10 mg tablet    fluticasone (FLONASE) 50 MCG/ACT nasal spray    fluticasone (FLOVENT DISKUS) 250 MCG/ACT diskus inhaler    levothyroxine (SYNTHROID, LEVOTHROID) 25 mcg tablet    generic DME    carBAMazepine (CARBATROL) 100 mg 12 hr capsule     azelastine (ASTEPRO) 137 MCG/SPRAY SOLN nasal solution    econazole nitrate (SPECTAZOLE) 1 % cream    RABEprazole (ACIPHEX) 20 MG tablet    generic DME    guaiFENesin (MUCINEX) 600 MG 12 hr tablet    generic DME    metroNIDAZOLE (METROCREAM) 0.75 % cream    famotidine (PEPCID) 20 mg tablet    pseudoephedrine (SUDAFED) 30 mg tablet    [employer] 12 HOUR NASAL DECONGESTANT 120 MG 12 hr tablet    Non-System Medication    Calcium Carb-Cholecalciferol 600-200 MG-UNIT TABS    Denosumab (PROLIA SC)    loratadine (CLARITIN) 10 mg tablet    Multiple Vitamin (MULTIVITAMIN) per tablet    cholecalciferol (VITAMIN D) 1000 UNITS capsule     No current facility-administered medications for this visit.       Medication list reviewed and updated, no changes were made today    Exam: BP 120/62   Pulse 76   Ht 1.6 m (5' 2.99")   Wt 51.2 kg (112 lb 12.8 oz)   SpO2 99%   BMI 19.99 kg/m   Well-nourished alert and conversant, no acute distress.  Nasal mucosa boggy, no exudate or erythema. Neck is supple without lad, lungs are CTA bilaterally, resp easy.  Cardiac exam reveals nl S1S2 RRR.  Breast exam reveals fibrocystic changes, no mass or axillary adenopathy  Abdomen is soft, nontender, normoactive BS appreciated.  No organomegaly noted.  Extremities reveal 2+DP and PT pulses and no edema.    A/P:   Urinary frequency and  urgency--trial of mybetriq 25 mg  HTN--well controlled on amlodipine  Hyperlipidemia--LDL at goal on low dose atorvastatin  PIP joint tenderness and sweliing--will check xray to assess for erosions

## 2024-01-25 LAB — CARBAMAZEPINE, FREE & TOTAL
Carbamazepine, Free: 18.8 % (ref 8.0–35.0)
Carbamazepine,Free: 2.2 ug/mL (ref 1.0–3.0)
Carbamazepine,Total: 11.7 ug/mL (ref 4.0–12.0)

## 2024-01-27 ENCOUNTER — Encounter: Payer: Self-pay | Admitting: Primary Care

## 2024-01-28 ENCOUNTER — Other Ambulatory Visit: Payer: Self-pay

## 2024-01-29 ENCOUNTER — Other Ambulatory Visit: Payer: Self-pay

## 2024-01-29 ENCOUNTER — Ambulatory Visit: Payer: Medicare Other | Admitting: Orthopedic Surgery

## 2024-01-29 NOTE — Addendum Note (Signed)
 Addended by: Elease Etienne A on: 01/29/2024 09:03 AM   Modules accepted: Orders

## 2024-01-29 NOTE — Progress Notes (Signed)
 Remote pacemaker transmission.

## 2024-01-30 ENCOUNTER — Ambulatory Visit: Attending: Primary Care | Admitting: Primary Care

## 2024-01-30 ENCOUNTER — Encounter: Payer: Self-pay | Admitting: Primary Care

## 2024-01-30 VITALS — BP 118/64 | HR 85 | Temp 97.7°F | Ht 63.0 in | Wt 112.4 lb

## 2024-01-30 DIAGNOSIS — J45909 Unspecified asthma, uncomplicated: Secondary | ICD-10-CM | POA: Insufficient documentation

## 2024-01-30 DIAGNOSIS — J0100 Acute maxillary sinusitis, unspecified: Secondary | ICD-10-CM | POA: Insufficient documentation

## 2024-01-30 MED ORDER — FLUTICASONE-SALMETEROL 250-50 MCG/ACT IN AEPB *I*: 1.0000 | INHALATION_SPRAY | Freq: Two times a day (BID) | RESPIRATORY_TRACT | 5 refills | Status: DC

## 2024-01-30 MED ORDER — DOXYCYCLINE HYCLATE 100 MG PO TABS *I*
100.0000 mg | ORAL_TABLET | Freq: Two times a day (BID) | ORAL | 0 refills | Status: DC
Start: 2024-01-30 — End: 2024-02-11

## 2024-01-30 MED ORDER — DOXYCYCLINE HYCLATE 100 MG PO TABS *I*
100.0000 mg | ORAL_TABLET | Freq: Two times a day (BID) | ORAL | 0 refills | Status: DC
Start: 2024-01-30 — End: 2024-01-30

## 2024-01-30 NOTE — Progress Notes (Signed)
 Reason for Visit: Other (Sinus pressure and chest congestion)    After her visit last week, she started with more sinus congestion  She started the inhaler with minimal relief  More clearing her throat then coughing  Some sore throat  Blowing her nose, not sure if I is discolored  She does feel more fatigued  Anora ellipta  When she takes a deep breath, she feels as thought it doesn't go the whole way  No SOB, no tightness    Patient Active Problem List   Diagnosis Code    Reported Trauma Neck     Allergic rhinitis J30.9    Irritable bowel syndrome K58.9    Essential hypertension I10    Dysplastic Nevus I78.1    Rosacea L71.9    Osteoporosis M81.0    Hypothyroidism E03.9    Scoliosis M41.20    Urge Incontinence Of Urine N39.41    A-V Malformation Repair Supratentorial Complex     Seizure disorder G40.909    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    Ringing In The Ears (Tinnitus) H93.19    GERD (gastroesophageal reflux disease) K21.9    Pain in joint, ankle and foot M25.579    Weakness of foot R29.898    Hyperlipidemia E78.5    Right ankle pain M25.571     Current Outpatient Medications   Medication    mirabegron (MYRBETRIQ) 25 mg 24 hr tablet    atorvastatin (LIPITOR) 10 mg tablet    amLODIPine (NORVASC) 5 mg tablet    carBAMazepine (TEGRETOL XR) 200 mg 12 hr tablet    triamcinolone (KENALOG) 0.1 % ointment    solifenacin (VESICARE) 10 mg tablet    fluticasone (FLONASE) 50 MCG/ACT nasal spray    fluticasone (FLOVENT DISKUS) 250 MCG/ACT diskus inhaler    levothyroxine (SYNTHROID, LEVOTHROID) 25 mcg tablet    generic DME    carBAMazepine (CARBATROL) 100 mg 12 hr capsule    azelastine (ASTEPRO) 137 MCG/SPRAY SOLN nasal solution    econazole nitrate (SPECTAZOLE) 1 % cream    RABEprazole (ACIPHEX) 20 MG tablet    generic DME    guaiFENesin (MUCINEX) 600 MG 12 hr tablet    generic DME    metroNIDAZOLE (METROCREAM) 0.75 % cream    famotidine (PEPCID) 20 mg tablet    pseudoephedrine (SUDAFED) 30 mg tablet    [facility] 12  HOUR NASAL DECONGESTANT 120 MG 12 hr tablet    Non-System Medication    Calcium Carb-Cholecalciferol 600-200 MG-UNIT TABS    Denosumab (PROLIA SC)    loratadine (CLARITIN) 10 mg tablet    Multiple Vitamin (MULTIVITAMIN) per tablet    cholecalciferol (VITAMIN D) 1000 UNITS capsule     No current facility-administered medications for this visit.       Medication list reviewed and updated, no changes were made today    Exam: BP 118/64   Pulse 85   Temp 36.5 C (97.7 F)   Ht 1.6 m (5\' 3" )   Wt 51 kg (112 lb 6.4 oz)   SpO2 100%   BMI 19.91 kg/m   Well nourished alert and conversant 68 yo in nad. Reflexive cough appreciated when he tries to take a deep breath  TMs clear  Nasal mucosa erythematous with dark yellow exudate appreciated.   Oral mucosa moist without exudate  Neck is supple without lad, no supraclavicular adenopathy  Lungs are CTA , but prolonged exhalatory phase, no frank wheezes    A/P:   Acute maxillary sinusitis with reactive  airway disease  Will treat with 10 day course of doxycycline and renewed script for advair 250/50 twice daily

## 2024-01-31 ENCOUNTER — Ambulatory Visit: Admitting: Primary Care

## 2024-02-07 ENCOUNTER — Other Ambulatory Visit: Payer: Self-pay

## 2024-02-07 ENCOUNTER — Ambulatory Visit: Attending: Orthopedic Surgery | Admitting: Orthopedic Surgery

## 2024-02-07 ENCOUNTER — Encounter: Payer: Self-pay | Admitting: Orthopedic Surgery

## 2024-02-07 VITALS — BP 140/87 | HR 71 | Temp 97.9°F | Ht 63.78 in | Wt 111.1 lb

## 2024-02-07 DIAGNOSIS — M810 Age-related osteoporosis without current pathological fracture: Secondary | ICD-10-CM | POA: Insufficient documentation

## 2024-02-07 MED ORDER — DENOSUMAB 60 MG/ML SC SOLN *I*
60.0000 mg | Freq: Once | SUBCUTANEOUS | Status: AC
Start: 2024-02-07 — End: 2024-02-07
  Administered 2024-02-07: 60 mg via SUBCUTANEOUS

## 2024-02-07 NOTE — Progress Notes (Signed)
 CC: Osteoporosis follow up     [name] is a 68 y.o. female presenting for osteoporosis follow up.     Medication history:  Fosamax and actonel for a number of years, caused some esophageal problems   Forteo x 2 years  Prolia for the past 4-5 years, with the last injection being 06/20/23     Fracture history:   2007 wrist fracture from slipping on ice  2010 wrist fracture from slipping on a rug  2024 ankle fracture from a fall      Servings of calcium in daily diet:   Yogurt and cheese     Calcium supplement:  600mg  daily     Vitamin D supplement:   1000 units daily     Weightbearing exercise:   Does whole body exercises with her personal trainer including lifting light free weights     Of systemic steroids, anticonvulsants, lasix, PPIs and hormone deprivation therapy, pt takes:   Rabeprazole , fluticasone inhaler currently for an infection, fluticasone every day     DEXA:   DEXA scan was personally reviewed by me and discussed with the patient.     [facility]   Date: 07/25/22   Site T-score % change since previous   Worst spine vertebra -2.7 -1.4   Left femoral neck -3.4    Right femoral neck -3.9    Left total hip -3.0 -1.6   Right total hip  -3.4 +0.7       Past Medical History:   Diagnosis Date    Contusion of foot 06/02/2013    Hypertension     Hypothyroidism     Osteoporosis     Seizure     Stroke        ROS reviewed and pertinent positives noted in HPI.     Labs:       Lab results: 01/22/24  0950   Sodium 140   Potassium 5.0   Chloride 102   CO2 29*   UN 14   Creatinine 0.55   Glucose 89   Calcium 9.4           Lab results: 01/22/24  0950   25-OH Vit Total 49        Physical exam:   Patient is alert and oriented with equal pupils and symmetric facial movements. Breathing is normal and nonlabored. Vitals are within acceptable limits.     Assessment/plan:   Pt has osteoporosis. Labs are acceptable. I recommended that pt receive Prolia today. We discussed administration, risks and  benefits and pt was invited to ask questions which were then answered. Pt elected to receive injection. Pt denies current infection and major dental work +/- 3 months. Prolia injection given today. Continue calcium and D. F/u in 6 months with new cal, creat, VIT D, NTX.     NEXT DEXA: Sept 2025     Prolia 60mg  SQ given today by me in left arm.  LOT [id number]  EXP 05/19/26

## 2024-02-11 ENCOUNTER — Other Ambulatory Visit: Payer: Self-pay | Admitting: Primary Care

## 2024-02-11 MED ORDER — DOXYCYCLINE HYCLATE 100 MG PO TABS *I*
100.0000 mg | ORAL_TABLET | Freq: Two times a day (BID) | ORAL | 0 refills | Status: DC
Start: 2024-02-11 — End: 2024-05-12

## 2024-02-14 ENCOUNTER — Other Ambulatory Visit: Payer: Self-pay | Admitting: Primary Care

## 2024-02-14 ENCOUNTER — Ambulatory Visit: Payer: Medicare Other | Attending: Internal Medicine | Admitting: Internal Medicine

## 2024-02-14 ENCOUNTER — Encounter: Payer: Self-pay | Admitting: Internal Medicine

## 2024-02-14 VITALS — BP 118/70 | HR 64 | Ht 66.0 in | Wt 166.0 lb

## 2024-02-14 DIAGNOSIS — I10 Essential (primary) hypertension: Secondary | ICD-10-CM

## 2024-02-14 DIAGNOSIS — I471 Supraventricular tachycardia, unspecified: Secondary | ICD-10-CM

## 2024-02-14 DIAGNOSIS — I340 Nonrheumatic mitral (valve) insufficiency: Secondary | ICD-10-CM

## 2024-02-14 DIAGNOSIS — Z9889 Other specified postprocedural states: Secondary | ICD-10-CM

## 2024-02-14 DIAGNOSIS — I480 Paroxysmal atrial fibrillation: Secondary | ICD-10-CM

## 2024-02-14 DIAGNOSIS — I495 Sick sinus syndrome: Secondary | ICD-10-CM

## 2024-02-14 DIAGNOSIS — R55 Syncope and collapse: Secondary | ICD-10-CM

## 2024-02-14 MED ORDER — FUROSEMIDE 20 MG PO TABS: 20.0000 mg | ORAL_TABLET | Freq: Every day | ORAL | 1 refills | Status: AC | PRN

## 2024-02-14 NOTE — Patient Instructions (Signed)
 Medication Instructions:  Start: Furosemide (Lasix) 20 mg once daily as needed for shortness of breath and/or swelling.  Lab Work: None  Testing/Procedures: Your physician has requested that you have an echocardiogram in August, prior to seeing Dr. Jacques Navy on 07/07/2024. Echocardiography is a painless test that uses sound waves to create images of your heart. It provides your doctor with information about the size and shape of your heart and how well your heart's chambers and valves are working. This procedure takes approximately one hour. There are no restrictions for this procedure. Please do NOT wear cologne, perfume, aftershave, or lotions (deodorant is allowed). Please arrive 15 minutes prior to your appointment time.   Follow-Up: At [facility], you and your health needs are our priority.  As part of our continuing mission to provide you with exceptional heart care, we have created designated Provider Care Teams.  These Care Teams include your primary Cardiologist (physician) and Advanced Practice Providers (APPs -  Physician Assistants and Nurse Practitioners) who all work together to provide you with the care you need, when you need it.   Your next appointment:    07/07/2024 at 9:20 am  Provider:   Parke Poisson, MD

## 2024-02-14 NOTE — Progress Notes (Signed)
 Cardiology Office Note:  .   Date:  02/14/2024  ID:  Ariel Simpson, DOB April 24, 1956, MRN [id number] PCP: Pincus Sanes, MD  [facility] Providers Cardiologist:  Parke Poisson, MD Electrophysiologist:  Lewayne Bunting, MD    History of Present Illness: .   Ariel Simpson is a 68 y.o. female.  Discussed the use of AI scribe software for clinical note transcription with the patient, who gave verbal consent to proceed.  History of Present Illness   The patient, with a history of mitral valve regurgitation, has been monitoring her blood pressure at home and has noticed a decrease since starting amlodipine. She reports that the high readings are what stand out in her mind, but overall, the average readings are in the 120s systolic and 80s diastolic. She has not noticed any significant changes in her symptoms since starting the new medication. She continues to be active, practicing yoga and gardening, and has not experienced any issues with position changes or postural changes. She has a fear of experiencing symptoms during yoga or when changing positions, but this has not occurred. She is planning to travel and is concerned about managing her condition while abroad. She has not noticed any changes in her symptoms since starting the new medication.        ROS: negative except per HPI above.  Studies Reviewed: Marland Kitchen   EKG Interpretation Date/Time:  Thursday February 14 2024 10:23:09 EDT Ventricular Rate:  64 PR Interval:  194 QRS Duration:  86 QT Interval:  428 QTC Calculation: 441 R Axis:   64  Text Interpretation: Atrial-paced rhythm When compared with ECG of 14-Dec-2023 15:56, No significant change was found Confirmed by Weston Brass ([id number]) on 02/24/2024 11:14:55 PM    Results   DIAGNOSTIC Transesophageal echocardiogram (TEE): Moderate-severe mitral valve regurgitation (12/2023)     Risk Assessment/Calculations:    Physical Exam:   VS:  BP 118/70 (BP Location: Left Arm,  Patient Position: Sitting, Cuff Size: Normal)   Pulse 64   Ht 5\' 6"  (1.676 m)   Wt 166 lb (75.3 kg)   SpO2 97%   BMI 26.79 kg/m    Wt Readings from Last 3 Encounters:  02/19/24 165 lb (74.8 kg)  02/14/24 166 lb (75.3 kg)  01/21/24 172 lb (78 kg)     Physical Exam   VITALS: BP- 118/70 GENERAL: Alert, cooperative, well developed, no acute distress. HEENT: Normocephalic, normal oropharynx, moist mucous membranes. CHEST: Clear to auscultation bilaterally, no wheezes, rhonchi, or crackles. CARDIOVASCULAR: 3/6 systolic apical murmur c/w mitral valve regurgitation murmur, normal heart rate and rhythm, S1 and S2 normal. ABDOMEN: Soft, non-tender, non-distended, without organomegaly, normal bowel sounds. EXTREMITIES: No cyanosis or edema. NEUROLOGICAL: Cranial nerves grossly intact, moves all extremities without gross motor or sensory deficit.       ASSESSMENT AND PLAN: .     Assessment and Plan    Mitral Valve Regurgitation S/p Mitral valve repair Medication management Mitral valve regurgitation is stable with current blood pressure control. Asymptomatic. Regular monitoring planned. - Order echocardiogram in August to assess mitral valve regurgitation. - Schedule follow-up appointment in August after echocardiogram. - Advise on cautious travel, monitoring blood pressure and fluid status. - Provide prescription for PRN Lasix 20 mg for shortness of breath or swelling.  Hypertension Hypertension controlled with amlodipine, losartan, and metoprolol. Blood pressure averages 120/80 mmHg. No significant postural symptoms noted. - Continue amlodipine 5 mg once daily. - Continue losartan 50 mg twice daily. - Continue  metoprolol 25 mg twice daily. - Monitor blood pressure regularly. - Advise cautious salt intake, especially during travel.

## 2024-02-18 NOTE — Progress Notes (Unsigned)
    Subjective:    Patient ID: Ariel Simpson, female    DOB: 12-Jul-1956, 68 y.o.   MRN: [id number]      HPI Ariel Simpson is here for No chief complaint on file.    Cough -     Medications and allergies reviewed with patient and updated if appropriate.  Current Outpatient Medications on File Prior to Visit  Medication Sig Dispense Refill   amLODipine (NORVASC) 5 MG tablet Take 1 tablet (5 mg total) by mouth daily. 30 tablet 11   amoxicillin (AMOXIL) 500 MG capsule TAKE 4 CAPSULES BY MOUTH 1 HOUR BEFORE DENTAL APPOINTMENT 4 capsule 3   aspirin 81 MG tablet Take 1 tablet (81 mg total) by mouth daily.     atorvastatin (LIPITOR) 20 MG tablet TAKE 1 TABLET BY MOUTH DAILY 90 tablet 0   BIOTIN PO Place 1 patch onto the skin daily.     furosemide (LASIX) 20 MG tablet Take 1 tablet (20 mg total) by mouth daily as needed (shortness of breath or swelling). 90 tablet 1   Glucosamine Sulfate-MSM (MSM-GLUCOSAMINE EX) Place 1 patch onto the skin daily.     losartan (COZAAR) 50 MG tablet Take 1 tablet (50 mg total) by mouth 2 (two) times daily. 180 tablet 3   metoprolol succinate (TOPROL XL) 25 MG 24 hr tablet Take 1 tablet (25 mg total) by mouth 2 (two) times daily. 180 tablet 1   Omega-3 Fatty Acids (FISH OIL PO) Take 2 capsules by mouth daily.     polyethylene glycol (MIRALAX / GLYCOLAX) packet Take 17 g by mouth daily.     Propylene Glycol, PF, (SYSTANE COMPLETE PF) 0.6 % SOLN Place 1 drop into both eyes as needed (dry eyes).     TURMERIC PO Take 1 tablet by mouth in the morning and at bedtime.     zolpidem (AMBIEN) 5 MG tablet TAKE 1/2 TABLET(2.5 MG) BY MOUTH AT BEDTIME AS NEEDED FOR SLEEP 45 tablet 1   No current facility-administered medications on file prior to visit.    Review of Systems     Objective:  There were no vitals filed for this visit. BP Readings from Last 3 Encounters:  02/14/24 118/70  01/21/24 116/74  12/25/23 137/84   Wt Readings from Last 3 Encounters:  02/14/24  166 lb (75.3 kg)  01/21/24 172 lb (78 kg)  12/25/23 168 lb (76.2 kg)   There is no height or weight on file to calculate BMI.    Physical Exam         Assessment & Plan:    See Problem List for Assessment and Plan of chronic medical problems.

## 2024-02-19 ENCOUNTER — Ambulatory Visit (INDEPENDENT_AMBULATORY_CARE_PROVIDER_SITE_OTHER): Admitting: Internal Medicine

## 2024-02-19 ENCOUNTER — Ambulatory Visit (INDEPENDENT_AMBULATORY_CARE_PROVIDER_SITE_OTHER)

## 2024-02-19 ENCOUNTER — Encounter: Payer: Self-pay | Admitting: Internal Medicine

## 2024-02-19 VITALS — BP 132/84 | HR 63 | Temp 98.2°F | Ht 66.0 in | Wt 165.0 lb

## 2024-02-19 DIAGNOSIS — R059 Cough, unspecified: Secondary | ICD-10-CM | POA: Diagnosis not present

## 2024-02-19 DIAGNOSIS — J069 Acute upper respiratory infection, unspecified: Secondary | ICD-10-CM | POA: Insufficient documentation

## 2024-02-19 DIAGNOSIS — R062 Wheezing: Secondary | ICD-10-CM | POA: Diagnosis not present

## 2024-02-19 NOTE — Assessment & Plan Note (Signed)
 Acute Symptoms likely viral in nature Home COVID test negative x 2 Will get chest x-ray given mild wheeze on exam Possibly flu Symptoms are improving Continue symptomatic treatment with over-the-counter cold medications, Tylenol/ibuprofen Increase rest and fluids Call if symptoms worsen or do not improve

## 2024-02-19 NOTE — Patient Instructions (Addendum)
      Have a chest xray downstairs    Medications changes include :   None       Return if symptoms worsen or fail to improve.

## 2024-02-26 ENCOUNTER — Telehealth: Payer: Self-pay | Admitting: Primary Care

## 2024-02-26 NOTE — Telephone Encounter (Signed)
 Patient called and stated she would like a scripts for PT for her right foot that she broke last summer. She states she is still having issues. She also needs one for her back but knows she can get PT for one body part so wondering which one is reccommended. She states she prefers the foot

## 2024-02-27 MED ORDER — GENERIC DME *A*
0 refills | Status: AC
Start: 2024-02-27 — End: ?

## 2024-02-27 NOTE — Telephone Encounter (Signed)
 I have seen them switch off, I just think they cannot do two body parts in one visit, so I will write for both, but she could do the foot one week, the back the next week, etc. Scripts are in my outbox

## 2024-02-28 NOTE — Telephone Encounter (Signed)
 Spoke to PT last night and it was confirmed that Pt can do that. Scripts faxed.

## 2024-03-14 ENCOUNTER — Other Ambulatory Visit: Payer: Self-pay | Admitting: Primary Care

## 2024-03-19 ENCOUNTER — Encounter: Payer: Self-pay | Admitting: Gastroenterology

## 2024-03-19 ENCOUNTER — Ambulatory Visit: Payer: Medicare Other

## 2024-03-24 ENCOUNTER — Other Ambulatory Visit (HOSPITAL_COMMUNITY): Payer: Medicare Other

## 2024-03-24 NOTE — Progress Notes (Signed)
 Outpatient Visit      Patient name: [name]  DOB: 1956/03/27       Age: 68 y.o.  MR#: [id number]    Encounter Date: 03/26/2024    Subjective:      Chief Complaint   Patient presents with    Blurred Vision    New Patient Visit     Combined forms of age-related cataract, bilateral     HPI     New Patient Visit     Additional comments: Combined forms of age-related cataract, bilateral           Comments    [name] is a 68 y.o. female presenting for a cataract evaluation.     Referred by Dr. Bernardino.   Patient reports vision appears to look darker. Patient denies ocular pain,     discomfort, flashes or floaters. Patient states she no longer drives at   night due long term sensitivity to glare.     Contact lens hx/use: no  Eye Surgery (Lasik or PRK etc): None  Eye Injuries: no   Occupation/Hobbies: Reading   Dominant Eye: right  Orthopedic and positioning issues -osteoporosis, scoliosis. Seizure   disorder.           Last edited by Nilda Pac, MD on 03/26/2024  9:16 AM.        has a current medication list which includes the following prescription(s): azelastine , generic dme, generic dme, fluticasone -salmeterol, mirabegron , atorvastatin , amlodipine , carbamazepine , triamcinolone , solifenacin , fluticasone , fluticasone , levothyroxine , generic dme, carbamazepine , econazole nitrate , rabeprazole , generic dme, guaifenesin, generic dme, metronidazole, famotidine , pseudoephedrine , [facility] 12 hour nasal decongestant, non-system medication, calcium  carbonate-cholecalciferol , denosumab , loratadine, multivitamin, cholecalciferol , and doxycycline  hyclate.     is allergic to dilantin [phenytoin], penicillins, sulfa antibiotics, bactrim, and seasonal allergies.      Past Medical History:   Diagnosis Date    Contusion of foot 06/02/2013    GERD (gastroesophageal reflux disease) 2012    Limited diet & aciphex     High blood pressure 1995    Hyperlipidemia 2010    Lipitor    Hypertension     Hypothyroidism     Irritable bowel  syndrome 1977    Intermittent, chronic diet controlled    Osteoporosis     Seizure     Stroke       Past Surgical History:   Procedure Laterality Date    COLONOSCOPY  Every 10 years    CONVERTED PROCEDURE      Craniotomy A-V Malformation Repair Conversion Data     HAND SURGERY  2007(L),2010(R)    Plates in hands-Dr Mitten reconstructive surgery    HX TONSILLECTOMY/ADENOIDECTOMY      Tonsillectomy Conversion Data     OTHER SURGICAL HISTORY  Tonsillectomy when I was [age] or [age]    TONSILLECTOMY      WRIST FRACTURE SURGERY  2007,2010    Reconstruction, plates        Specialty Problems    None       ROS    Positive for: Eyes  Negative for: Constitutional, Gastrointestinal, Neurological, Skin,   Genitourinary, Musculoskeletal, HENT, Endocrine, Cardiovascular,   Respiratory, Psychiatric, Allergic/Imm, Heme/Lymph  Last edited by Perry Ip, COA on 03/26/2024  8:34 AM.         Objective:     Base Eye Exam       Visual Acuity (Snellen - Linear)         Right Left    Dist cc 20/40 20/50  Dist ph cc NI 20/30    Near cc J1 OU      Correction: Glasses              Tonometry (Tonopen, 8:47 AM)         Right Left    Pressure 15 16              Pupils         Dark Light Shape React APD    Right 5 3 Round Brisk None    Left 5 3 Round Brisk None              Visual Fields         Left Right     Full Full              Extraocular Movement         Right Left     Full Full              Neuro/Psych       Oriented x3: Yes    Mood/Affect: Normal              Dilation       Both eyes: 2.5% Phenylephrine , 1.0% Tropicamide , 0.5% Proparacaine @ 8:48 AM                  Additional Tests       Dominant Eye       Right eye              Glare Testing (BAT)         Medium High    Right 20/50 20/50    Left 20/60 20/80                  Slit Lamp and Fundus Exam       External Exam         Right Left    External Normal ocular adnexae, lacrimal gland & drainage, orbits Normal ocular adnexae, lacrimal gland & drainage, orbits              Slit  Lamp Exam         Right Left    Lids/Lashes 3+ Dermatochalasis - upper lid 2+ Dermatochalasis - upper lid    Conjunctiva/Sclera Normal bulbar/palpebral, conjunctiva, sclera Normal bulbar/palpebral, conjunctiva, sclera    Cornea Normal epithelium, stroma, endothelium, tear film Normal epithelium, stroma, endothelium, tear film    Anterior Chamber Clear & deep Clear & deep    Iris Normal shape, size, morphology Normal shape, size, morphology    Lens NS1-2/early CS spoking in periphery NS2+/CSV2    Anterior Vitreous Posterior vitreous detachment Clear              Fundus Exam         Right Left    Disc Normal size, appearance, nerve fiber layer Normal size, appearance, nerve fiber layer    C/D Ratio 0.25 0.25    Macula Normal pigment, (-)FR Normal pigment, (-)FR    Vessels Normal, 2/3, (+)SVP Normal, 2/3, (+)SVP    Periphery Posterior pole clear w/ 1.5 x 2DD nevus w/ fine drusen overlying ST to mac just inside arcade, peripheral retina flat 360* w/o breaks Posterior pole clear, peripheral retina flat 360* w/o breaks                  Refraction       Wearing Rx  Sphere Cylinder Axis Add    Right -2.00 +1.00 174 +2.75    Left -2.00 +1.25 068 +2.75      Age: 58yrs +    Type: PAL              Manifest Refraction         Sphere Cylinder Axis Add    Right -2.00 +1.25 165 +2.75    Left -2.50 +1.00 015 +2.75                            No annotated images are attached to the encounter.      Assessment/Plan:     1. Combined forms of age-related cataract, bilateral        2. Choroidal nevus of right eye        3. Dermatochalasis of both upper eyelids        4. Myopia, bilateral        5. Regular astigmatism, bilateral        6. Presbyopia             Assessment/Plan:  1. Visually significant cataract  both eyes  affecting activities of daily living  -Patient states that having difficulty with driving, reading, and night vision  -We will plan cataract extraction with intraocular lens placement using local, topical anesthesia  in conjunction with monitored anesthesia care (MAC). There is a slight possibility that General Anesthesia may be necessary.   -The patient has elected to proceed,  left eye  first  -After a detailed discussion, the patient would like to aim for PLANO as a post-surgical refractive target    -Discussed R/B/A CE/PICOL including the risks of endophthalmitis, retinal detachment, choroidal hemorrhage, anterior and posterior capsule tears, retained lens fragment, lens placement in sulcus or anterior chamber, vitreous loss, anterior vitrectomy, corneal decompensation, ptosis, CME, PCO requiring Nd:YAG capsulotomy, need for glasses after surgery, need for further surgery, and risks of anesthesia and patient the wishes to proceed with surgery.  -Pt understands that no lens exists that can guarantee that pt is totally glasses independent.    -Although the patient has choroidal nevus, dermatochalasis, myopia, astigmatism, presbyopia, I believe cataract extraction surgery will benefit the patient's vision.    Plan:  Plan for CE surgery for  left eyes  first  Informed consents was taken for  left eye today.  Patient was seen and examined.  Findings, assessment, and plan were discussed in detail with the patient, who verbalized understanding of and agreement with plan. All questions were answered. The patient was instructed to call or come in if there are any new symptoms or existing symptoms persist or worsen. To call if questions or concerns: [phone], number provided.   Plan for co-management with Dr. Bernardino. Follow up was arranged.     HILARIO Roel Crouch, MD

## 2024-03-25 ENCOUNTER — Other Ambulatory Visit: Payer: Self-pay

## 2024-03-26 ENCOUNTER — Ambulatory Visit: Payer: Medicare Other | Attending: Ophthalmology | Admitting: Ophthalmology

## 2024-03-26 ENCOUNTER — Encounter: Payer: Self-pay | Admitting: Ophthalmology

## 2024-03-26 ENCOUNTER — Other Ambulatory Visit: Payer: Self-pay

## 2024-03-26 DIAGNOSIS — H25813 Combined forms of age-related cataract, bilateral: Secondary | ICD-10-CM | POA: Insufficient documentation

## 2024-03-26 DIAGNOSIS — H02831 Dermatochalasis of right upper eyelid: Secondary | ICD-10-CM | POA: Insufficient documentation

## 2024-03-26 DIAGNOSIS — H524 Presbyopia: Secondary | ICD-10-CM | POA: Insufficient documentation

## 2024-03-26 DIAGNOSIS — H02834 Dermatochalasis of left upper eyelid: Secondary | ICD-10-CM | POA: Insufficient documentation

## 2024-03-26 DIAGNOSIS — H52223 Regular astigmatism, bilateral: Secondary | ICD-10-CM | POA: Insufficient documentation

## 2024-03-26 DIAGNOSIS — D3131 Benign neoplasm of right choroid: Secondary | ICD-10-CM | POA: Insufficient documentation

## 2024-03-26 DIAGNOSIS — H5213 Myopia, bilateral: Secondary | ICD-10-CM | POA: Insufficient documentation

## 2024-03-26 NOTE — Invasive Procedure Plan of Care (Signed)
 CONSENT FOR MEDICAL  OR SURGICAL PROCEDURE                            Patient Name: [name]  [facility] [address]                                                              DOB: August 10, 1956         Please read this form or have someone read it to you.   It's important to understand all parts of this form. If something isn't clear, ask us  to explain.   When you sign it, that means you understand the form and give us  permission to do this surgery or procedure.     I agree for Oneta Bonnita Lobos, MD along with any assistants* they may choose, to treat the following condition(s): Visually significant cataract of the eye (Cloudy Lens)   By doing this surgery or procedure on me: Cloudy lens removal with placement of a lens implant    Lens implant type: iol: Monofocal    Goal for after surgery best uncorrected vision: Distance     This is also known as: Cataract extraction with placement of an intraocular lens implant        Laterality: Right     *if you'd like a list of the assistants, please ask. We can give that to you.    1. The care provider has explained my condition to me. They have told me how the procedure can help me. They have told me about other ways of treating my condition. I understand the care provider cannot guarantee the result of the procedure. If I don't have this procedure, my other choices are: Defer surgery at this time with continued observation          2. The care provider has told me the risks (problems that can happen) of the procedure. I understand there may be unwanted results. The risks that are related to this procedure include: Bleeding, infection, prolonged inflammation, pain, raised eye pressure, detached retina, macular edema, corneal edema, retained lens fragments, lid droop, temporary or permanent vision loss, unexpected.    Need for glasses, need for additional surgery, implant complications including removal / repositioning / exchange, need to put in different lens implant other  than what was chosen, lens related visual disturbances (depending upon your eye and the type of iol, this includes but is not limited to increased glare or halos, double vision, ghost images, impaired depth perception, blurry vision, or trouble driving at night.), reaction to anesthesia.    There is no guarantee that cataract surgery will improve your vision, it is possible that your vision could be made stable or worse. Although unlikely, complications may occur weeks, months or even years later. These and other complications may result in poor or total loss of vision, or, in rare occasion, even loss of the eye. You may need additional treatment, procedures, or surgery to treat these complications.        3. I understand that during the procedure, my care provider may find a condition that we didn't know about before the treatment started. Therefore, I agree that my care provider can perform any other treatment which they think  is necessary and available.    4. I give permission to the hospital and/or its departments to examine and keep tissue, blood, body parts, fluids or materials removed from my body during the procedure(s) to aid in diagnosis and treatment, after which they may be used for scientific research or teaching by appropriate persons. If these materials are used for science or teaching, my identity will be protected. I will no longer own or have any rights to these materials regardless of how they may be used.    5. My care provider might want a representative from a medical device company to be there during my procedure. I understand that person works for:          The ways they might help my care provider during my procedure include:            6. Here are my decisions about receiving blood, blood products, or tissues. I understand my decisions cover the time before, during and after my procedure, my treatment, and my time in the hospital. After my procedure, if my condition changes a lot, my care  provider will talk with me again about receiving blood or blood products. At that time, my care provider might need me to review and sign another consent form, about getting or refusing blood.    I understand that the blood is from the community blood supply. Volunteers donated the blood, the volunteers were screened for health problems. The blood was examined with very sensitive and accurate tests to look for hepatitis, HIV/AIDS, and other diseases. Before I receive blood, it is tested again to make sure it is the correct type.    My chances of getting a sickness from blood products are small. But no transfusion is 100% safe. I understand that my care provider feels the good I will receive from the blood is greater than the chances of something going wrong. My care provider has answered my questions about blood products.    Moderate Sedation:  The moderate sedation procedure has been explained to me as outlined below and my questions/concerns regarding the sedation have been answered/addressed to my satisfaction.    I understand that alternatives to receiving moderate sedation for this procedure include not undergoing the procedure, or undergoing the procedure without sedation. If neither of these options is acceptable, there may be other clinical options that I can discuss with my healthcare provider.   I understand that moderate sedation is a state of altered consciousness that is usually created by the administration of intravenous medications (medications I.V.). Patients under moderate sedation often feel sleepy and very relaxed, but will respond to commands. The purpose of moderate sedation is to make me comfortable during my procedure. This can also make the procedure easier to perform. I understand that while many patients undergoing moderate sedation will have little to no memory of the sedation or the procedure, awareness during the procedure is possible. Again, the goal is to make me comfortable.   I  understand that I will be carefully monitored throughout my sedation and procedure to keep me safe. I will not be discharged until the effects of my sedation have worn off sufficiently for me to stay safe. I must not drive, operate dangerous machinery or make important decisions until the next day following my sedation, and I must be accompanied by a responsible adult when I go home.  I understand that no medical treatment/procedure is without risk, and this includes moderate sedation, although risks  are rare. These include excessive sedation which may require special procedures to keep me safe. I understand some patients may experience nausea and vomiting after sedation. Rarely, patients develop unstable heart rhythms or breathing difficulties, which can lead to death in very rare circumstances.   By signing this document I acknowledge that I have read this consent and had the opportunity to discuss this with my healthcare provider. I also agree to allow my provider to perform any procedure he/she deems necessary to keep me safe during my sedation.       My decision  about blood or  blood products   Not applicable.        My decision   about tissue  Implants     Not applicable.          I understand this  form.    My care provider  or his/her  assistants have  explained:   What I am having done and why I need it.  What other choices I can make instead of having this done.  The benefits and possible risks (problems) to me of having this done.  The benefits and possible risks (problems) to me of receiving transplants, blood, or blood products.  There is no guarantee of the results.  The care provider may not stay with me the entire time that I am in the operating or procedure room.  My provider has explained how this may affect my procedure. My provider has answered my  questions about this.         I give my  permission for  this surgery or  procedure.            _________________________________________                                      My signature  (or parent or other person authorized to sign for you, if you are unable to sign for yourself or if you are under 30 years old)             _____________     Date      (MM/DD/YYYY)           _______    Time      Electronic Signatures will display at the bottom of the consent form.    Care provider's statement: I have discussed the planned procedure, including the possibility for transfusion of blood  products or receipt of tissue as necessary; expected benefits; the possible complications and risks; and possible alternatives  and their benefits and risks with the patients or the patient's surrogate. In my opinion, the patient or the patient's surrogate  understands the proposed procedure, its risks, benefits and alternatives.              Electronically signed by: Fairy Ginsberg, MD                                                03/26/2024         Date        9:24 AM        Time

## 2024-03-26 NOTE — Invasive Procedure Plan of Care (Signed)
 CONSENT FOR MEDICAL  OR SURGICAL PROCEDURE                            Patient Name: [name]  [facility] [address]                                                              DOB: 1956/05/15         Please read this form or have someone read it to you.   It's important to understand all parts of this form. If something isn't clear, ask us  to explain.   When you sign it, that means you understand the form and give us  permission to do this surgery or procedure.     I agree for Oneta Bonnita Lobos, MD along with any assistants* they may choose, to treat the following condition(s): Visually significant cataract of the eye (Cloudy Lens)   By doing this surgery or procedure on me: Cloudy lens removal with placement of a lens implant    Lens implant type: iol: Monofocal    Goal for after surgery best uncorrected vision: Distance     This is also known as: Cataract extraction with placement of an intraocular lens implant        Laterality: Left     *if you'd like a list of the assistants, please ask. We can give that to you.    1. The care provider has explained my condition to me. They have told me how the procedure can help me. They have told me about other ways of treating my condition. I understand the care provider cannot guarantee the result of the procedure. If I don't have this procedure, my other choices are: Defer surgery at this time with continued observation          2. The care provider has told me the risks (problems that can happen) of the procedure. I understand there may be unwanted results. The risks that are related to this procedure include: Bleeding, infection, prolonged inflammation, pain, raised eye pressure, detached retina, macular edema, corneal edema, retained lens fragments, lid droop, temporary or permanent vision loss, unexpected.    Need for glasses, need for additional surgery, implant complications including removal / repositioning / exchange, need to put in different lens implant other  than what was chosen, lens related visual disturbances (depending upon your eye and the type of iol, this includes but is not limited to increased glare or halos, double vision, ghost images, impaired depth perception, blurry vision, or trouble driving at night.), reaction to anesthesia.    There is no guarantee that cataract surgery will improve your vision, it is possible that your vision could be made stable or worse. Although unlikely, complications may occur weeks, months or even years later. These and other complications may result in poor or total loss of vision, or, in rare occasion, even loss of the eye. You may need additional treatment, procedures, or surgery to treat these complications.        3. I understand that during the procedure, my care provider may find a condition that we didn't know about before the treatment started. Therefore, I agree that my care provider can perform any other treatment which they think  is necessary and available.    4. I give permission to the hospital and/or its departments to examine and keep tissue, blood, body parts, fluids or materials removed from my body during the procedure(s) to aid in diagnosis and treatment, after which they may be used for scientific research or teaching by appropriate persons. If these materials are used for science or teaching, my identity will be protected. I will no longer own or have any rights to these materials regardless of how they may be used.    5. My care provider might want a representative from a medical device company to be there during my procedure. I understand that person works for:          The ways they might help my care provider during my procedure include:            6. Here are my decisions about receiving blood, blood products, or tissues. I understand my decisions cover the time before, during and after my procedure, my treatment, and my time in the hospital. After my procedure, if my condition changes a lot, my care  provider will talk with me again about receiving blood or blood products. At that time, my care provider might need me to review and sign another consent form, about getting or refusing blood.    I understand that the blood is from the community blood supply. Volunteers donated the blood, the volunteers were screened for health problems. The blood was examined with very sensitive and accurate tests to look for hepatitis, HIV/AIDS, and other diseases. Before I receive blood, it is tested again to make sure it is the correct type.    My chances of getting a sickness from blood products are small. But no transfusion is 100% safe. I understand that my care provider feels the good I will receive from the blood is greater than the chances of something going wrong. My care provider has answered my questions about blood products.    Moderate Sedation:  The moderate sedation procedure has been explained to me as outlined below and my questions/concerns regarding the sedation have been answered/addressed to my satisfaction.    I understand that alternatives to receiving moderate sedation for this procedure include not undergoing the procedure, or undergoing the procedure without sedation. If neither of these options is acceptable, there may be other clinical options that I can discuss with my healthcare provider.   I understand that moderate sedation is a state of altered consciousness that is usually created by the administration of intravenous medications (medications I.V.). Patients under moderate sedation often feel sleepy and very relaxed, but will respond to commands. The purpose of moderate sedation is to make me comfortable during my procedure. This can also make the procedure easier to perform. I understand that while many patients undergoing moderate sedation will have little to no memory of the sedation or the procedure, awareness during the procedure is possible. Again, the goal is to make me comfortable.   I  understand that I will be carefully monitored throughout my sedation and procedure to keep me safe. I will not be discharged until the effects of my sedation have worn off sufficiently for me to stay safe. I must not drive, operate dangerous machinery or make important decisions until the next day following my sedation, and I must be accompanied by a responsible adult when I go home.  I understand that no medical treatment/procedure is without risk, and this includes moderate sedation, although risks  are rare. These include excessive sedation which may require special procedures to keep me safe. I understand some patients may experience nausea and vomiting after sedation. Rarely, patients develop unstable heart rhythms or breathing difficulties, which can lead to death in very rare circumstances.   By signing this document I acknowledge that I have read this consent and had the opportunity to discuss this with my healthcare provider. I also agree to allow my provider to perform any procedure he/she deems necessary to keep me safe during my sedation.       My decision  about blood or  blood products   Not applicable.        My decision   about tissue  Implants     Not applicable.          I understand this  form.    My care provider  or his/her  assistants have  explained:   What I am having done and why I need it.  What other choices I can make instead of having this done.  The benefits and possible risks (problems) to me of having this done.  The benefits and possible risks (problems) to me of receiving transplants, blood, or blood products.  There is no guarantee of the results.  The care provider may not stay with me the entire time that I am in the operating or procedure room.  My provider has explained how this may affect my procedure. My provider has answered my  questions about this.         I give my  permission for  this surgery or  procedure.            _________________________________________                                      My signature  (or parent or other person authorized to sign for you, if you are unable to sign for yourself or if you are under 32 years old)             _____________     Date      (MM/DD/YYYY)           _______    Time      Electronic Signatures will display at the bottom of the consent form.    Care provider's statement: I have discussed the planned procedure, including the possibility for transfusion of blood  products or receipt of tissue as necessary; expected benefits; the possible complications and risks; and possible alternatives  and their benefits and risks with the patients or the patient's surrogate. In my opinion, the patient or the patient's surrogate  understands the proposed procedure, its risks, benefits and alternatives.              Electronically signed by: Fairy Ginsberg, MD                                                03/26/2024         Date        9:23 AM        Time

## 2024-03-28 DIAGNOSIS — Z1231 Encounter for screening mammogram for malignant neoplasm of breast: Secondary | ICD-10-CM | POA: Diagnosis not present

## 2024-03-28 LAB — HM MAMMOGRAPHY

## 2024-03-31 ENCOUNTER — Telehealth: Payer: Self-pay | Admitting: Ophthalmology

## 2024-03-31 NOTE — Telephone Encounter (Signed)
 Spoke with PT scheduled as follows    OS 07-01-24  OD 07-15-24

## 2024-03-31 NOTE — Telephone Encounter (Signed)
 Left message for patient to call back about scheduling eye surgery.     Please reach out to surgical scheduler Edwina Gram for assistance.

## 2024-04-06 ENCOUNTER — Encounter: Payer: Self-pay | Admitting: Internal Medicine

## 2024-04-07 NOTE — Telephone Encounter (Signed)
 Please review and advise on Pacemaker report- has pt been assigned a new provider?

## 2024-04-11 ENCOUNTER — Encounter: Payer: Self-pay | Admitting: Gastroenterology

## 2024-04-15 LAB — CUP PACEART REMOTE DEVICE CHECK
Battery Remaining Longevity: 149 mo
Battery Voltage: 3.04 V
Brady Statistic AP VP Percent: 0.04 %
Brady Statistic AP VS Percent: 96.38 %
Brady Statistic AS VP Percent: 0 %
Brady Statistic AS VS Percent: 3.58 %
Brady Statistic RA Percent Paced: 96.74 %
Brady Statistic RV Percent Paced: 0.04 %
Date Time Interrogation Session: 20250428190517
Implantable Lead Connection Status: 753985
Implantable Lead Connection Status: 753985
Implantable Lead Implant Date: 20231101
Implantable Lead Implant Date: 20231101
Implantable Lead Location: 753859
Implantable Lead Location: 753860
Implantable Lead Model: 3830
Implantable Lead Model: 5076
Implantable Pulse Generator Implant Date: 20231101
Lead Channel Impedance Value: 342 Ohm
Lead Channel Impedance Value: 380 Ohm
Lead Channel Impedance Value: 494 Ohm
Lead Channel Impedance Value: 551 Ohm
Lead Channel Pacing Threshold Amplitude: 0.625 V
Lead Channel Pacing Threshold Amplitude: 0.875 V
Lead Channel Pacing Threshold Pulse Width: 0.4 ms
Lead Channel Pacing Threshold Pulse Width: 0.4 ms
Lead Channel Sensing Intrinsic Amplitude: 14.25 mV
Lead Channel Sensing Intrinsic Amplitude: 14.25 mV
Lead Channel Sensing Intrinsic Amplitude: 3.125 mV
Lead Channel Sensing Intrinsic Amplitude: 3.125 mV
Lead Channel Setting Pacing Amplitude: 1.5 V
Lead Channel Setting Pacing Amplitude: 2 V
Lead Channel Setting Pacing Pulse Width: 0.4 ms
Lead Channel Setting Sensing Sensitivity: 1.2 mV
Zone Setting Status: 755011

## 2024-04-16 ENCOUNTER — Ambulatory Visit: Payer: Self-pay | Admitting: Internal Medicine

## 2024-04-21 ENCOUNTER — Other Ambulatory Visit
Admission: RE | Admit: 2024-04-21 | Discharge: 2024-04-21 | Disposition: A | Discharge status: Home / Self Care | Source: Ambulatory Visit | Attending: Obstetrics and Gynecology | Admitting: Obstetrics and Gynecology

## 2024-04-21 DIAGNOSIS — Z1151 Encounter for screening for human papillomavirus (HPV): Secondary | ICD-10-CM | POA: Insufficient documentation

## 2024-04-21 DIAGNOSIS — Z124 Encounter for screening for malignant neoplasm of cervix: Secondary | ICD-10-CM | POA: Insufficient documentation

## 2024-04-22 LAB — HPV DNA PROBE WITH CYTOLOGY
HPV Other High Risk: NEGATIVE
HPV Type 16: NEGATIVE
HPV Type 18: NEGATIVE

## 2024-04-23 ENCOUNTER — Telehealth: Payer: Self-pay | Admitting: Ophthalmology

## 2024-04-23 ENCOUNTER — Other Ambulatory Visit: Payer: Self-pay | Admitting: Internal Medicine

## 2024-04-23 NOTE — Telephone Encounter (Signed)
 Spoke with husband, patient is sick and will call us  back tomorrow to confirm whether they would like to move up their surgical dates to 07-15 and 07-29.

## 2024-04-24 ENCOUNTER — Other Ambulatory Visit: Payer: Self-pay | Admitting: Primary Care

## 2024-04-25 LAB — GYN CYTOLOGY

## 2024-04-28 ENCOUNTER — Encounter: Payer: Self-pay | Admitting: Ophthalmology

## 2024-04-28 ENCOUNTER — Other Ambulatory Visit: Payer: Self-pay

## 2024-04-28 ENCOUNTER — Ambulatory Visit: Admitting: Ophthalmology

## 2024-04-28 DIAGNOSIS — H168 Other keratitis: Secondary | ICD-10-CM

## 2024-04-28 MED ORDER — CARBOXYMETHYLCELLULOSE SODIUM 0.5 % OP SOLN WRAPPED *I*
1.0000 [drp] | Freq: Four times a day (QID) | OPHTHALMIC | 11 refills | Status: DC
Start: 2024-04-28 — End: 2024-06-20

## 2024-04-28 MED ORDER — TOBRAMYCIN-DEXAMETHASONE 0.3-0.1 % OP SUSP *I*
1.0000 [drp] | Freq: Four times a day (QID) | OPHTHALMIC | 0 refills | Status: DC
Start: 2024-04-28 — End: 2024-05-12

## 2024-04-28 NOTE — Progress Notes (Signed)
 Assessment/Plan:      1. Marginal keratitis  carboxymethylcellulose (REFRESH PLUS) 0.5 % ophthalmic solution    tobramycin-dexAMETHasone (TOBRADEX) ophthalmic suspension           Plan:  Start TobraDex 4 times a day. Pt reports pt can only take oral doxycycline . Pt ed that no topical doxycycline  is available. Pt assured that allergic reaction with topical antibiotic is less severe than oral. PT ed on punctal occlusion and wiping excess drops to minimize systemic exposure and allergic reaction. Pt ed to start artificial tears 4 times a day. Pt ed to wait about 5 minutes in between the two drops. Return to clinic 1 week or sooner prn.

## 2024-05-01 ENCOUNTER — Ambulatory Visit: Admitting: Primary Care

## 2024-05-04 ENCOUNTER — Other Ambulatory Visit: Payer: Self-pay

## 2024-05-05 ENCOUNTER — Ambulatory Visit: Admitting: Ophthalmology

## 2024-05-05 DIAGNOSIS — H168 Other keratitis: Secondary | ICD-10-CM

## 2024-05-05 NOTE — Progress Notes (Signed)
 Assessment/Plan:      1. Marginal keratitis             Plan:    Resolved with scar. Pt reassurance provided. Pt ed to stop TobraDex . Continue with artificial tears 4 times a day until cataract surgery.

## 2024-05-09 ENCOUNTER — Ambulatory Visit: Attending: Neuro-ophthalmology

## 2024-05-09 ENCOUNTER — Other Ambulatory Visit: Payer: Self-pay

## 2024-05-09 DIAGNOSIS — H168 Other keratitis: Secondary | ICD-10-CM | POA: Insufficient documentation

## 2024-05-09 MED ORDER — MOXIFLOXACIN HCL 0.5 % OP SOLN *I*
1.0000 [drp] | Freq: Four times a day (QID) | OPHTHALMIC | 0 refills | Status: DC
Start: 2024-05-09 — End: 2024-06-20

## 2024-05-09 NOTE — Progress Notes (Addendum)
 Patient name: [name]  DOB: 1956-01-25       Age: 68 y.o.  MR#: [id number]    Encounter Date: 05/09/2024    Subjective    Subjective:     Chief Complaint:   Chief Complaint   Patient presents with    Eye Problem     HPI    Hx Marginal keratitis OD    Pt states her right eye became painful again last night. Lights are more   bothersome. No change in vision.    Ocular meds:  Last day for Tobradex  was Sunday.  Refresh or Thera tears 4/4 (patient not sure which one)    Planning for cataract surgery OU with Dr Oneta in August       Last edited by Fairy Doyal Rancher, MD on 05/09/2024 12:18 PM.        Current Outpatient Medications (Ophthalmic)   Medication Sig Dispense Refill    moxifloxacin (VIGAMOX) 0.5 % ophthalmic solution Place 1 drop into the right eye 4 times daily for Cornea Inflammation. 3 mL 0    carboxymethylcellulose (REFRESH PLUS) 0.5 % ophthalmic solution Place 1 drop into both eyes 4 times daily. 15 mL 11    tobramycin -dexAMETHasone  (TOBRADEX ) ophthalmic suspension Place 1 drop into both eyes 4 times daily for Inflammation of Anterior Segment of Eye. 5 mL 0     Current Outpatient Medications (Other)   Medication Sig Dispense Refill    RABEprazole  (ACIPHEX ) 20 MG tablet TAKE 1 TABLET BY MOUTH EVERY DAY 90 tablet 2    azelastine  (ASTEPRO ) 137 MCG/SPRAY SOLN nasal solution SPRAY 2 SPRAYS INTO EACH NOSTRIL TWO TIMES DAILY 90 mL 3    generic DME PT evaluate and treat, dx: right foot pain 12 each 0    generic DME PT evaluate and treat, dx: low back pain 12 each 0    doxycycline  hyclate (VIBRA -TABS) 100 mg tablet Take 1 tablet (100 mg total) by mouth 2 times daily for Acute Bacterial Infection of the Sinuses. (Patient not taking: Reported on 04/28/2024) 14 tablet 0    fluticasone -salmeterol (ADVAIR/WIXELA) 250-50 MCG/ACT diskus inhaler Inhale 1 puff into the lungs 2 times daily for reactive airway disease. 60 each 5    mirabegron  (MYRBETRIQ ) 25 mg 24 hr tablet Take 1 tablet (25 mg total) by mouth  daily. 30 tablet 5    atorvastatin  (LIPITOR) 10 mg tablet TAKE 1 TABLET BY MOUTH EVERY DAY WITH DINNER 90 tablet 3    amLODIPine  (NORVASC ) 5 mg tablet TAKE 1 TABLET BY MOUTH EVERY DAY 90 tablet 3    carBAMazepine  (TEGRETOL  XR) 200 mg 12 hr tablet TAKE 2 TABLETS BY MOUTH TWO TIMES DAILY WITH MEALS ALONG WITH 100MG  TABLET SWALLOW WHOLE, DO NOT BREAK, CRUSH OR CHEW 360 tablet 2    triamcinolone  (KENALOG ) 0.1 % ointment Apply topically 2 times daily. to the following areas: ear 45 g 0    solifenacin  (VESICARE ) 10 mg tablet TAKE 1 TABLET BY MOUTH EVERY DAY 90 tablet 3    fluticasone  (FLONASE ) 50 MCG/ACT nasal spray Spray 1 spray into nostril daily.      fluticasone  (FLOVENT  DISKUS) 250 MCG/ACT diskus inhaler Inhale 1 puff into the lungs 2 times daily. 60 each 1    levothyroxine  (SYNTHROID , LEVOTHROID) 25 mcg tablet TAKE 1 TABLET BY MOUTH EVERY DAY BEFORE BREAKFAST 90 tablet 3    generic DME Walker.  Use as directed. 1 each 0    carBAMazepine  (CARBATROL ) 100 mg 12 hr capsule TAKE 1  CAPSULE BY MOUTH TWO TIMES DAILY 180 capsule 3    econazole nitrate  (SPECTAZOLE ) 1 % cream Apply topically daily. to the following areas: TOES/TOENAIL 30 g 1    generic DME PT evaluate and treat, strengthening and conditioning dx: right foot and back pain 12 each 0    guaiFENesin (MUCINEX) 600 MG 12 hr tablet Take 2 tablets (1,200 mg total) by mouth 2 times daily.      generic DME Bilateral custom orthotics; Dx: bilateral foot pain 2 each 0    metroNIDAZOLE (METROCREAM) 0.75 % cream Apply topically 2 times daily      famotidine  (PEPCID ) 20 mg tablet Take 1 tablet (20 mg total) by mouth 2 times daily 180 tablet 3    pseudoephedrine  (SUDAFED) 30 mg tablet TAKE 1 TABLET BY MOUTH EVERY 4 HOURS AS NEEDED FOR CONGESTION 72 tablet 1    [employer] 12 HOUR NASAL DECONGESTANT 120 MG 12 hr tablet TAKE 1 TABLET BY MOUTH EVERY 12 HOURS 60 tablet 4    Non-System Medication Bilateral custom-made orthotics  Dx: bilateral foot pain, history of stroke, disp: 1pair 1  each 0    Calcium  Carb-Cholecalciferol  600-200 MG-UNIT TABS Take 1 tablet by mouth 3 times daily 30 each 3    Denosumab  (PROLIA  SC) Inject 1 Device into the skin every 6 months      loratadine (CLARITIN) 10 mg tablet Take 1 tablet (10 mg total) by mouth daily.      Multiple Vitamin (MULTIVITAMIN) per tablet Take 1 tablet by mouth daily.      cholecalciferol  (VITAMIN D ) 1000 UNITS capsule Take 1 capsule (1,000 units total) by mouth daily.         ROS    Positive for: Eyes  Negative for: Constitutional, Gastrointestinal, Neurological, Skin,   Genitourinary, Musculoskeletal, HENT, Endocrine, Cardiovascular,   Respiratory, Psychiatric, Allergic/Imm, Heme/Lymph  Last edited by Quenton Dines, COT on 05/09/2024 10:10 AM.            Objective    Objective:     Base Eye Exam       Visual Acuity (Snellen - Linear)         Right Left    Dist cc 20/30 +1 20/40    Dist ph cc  NI              Tonometry (Tonopen, 10:25 AM)         Right Left    Pressure 15 13              Pupils         Pupils Shape React APD    Right PERRLA Round Brisk None    Left PERRLA Round Brisk None              Extraocular Movement         Right Left     Full Full              Neuro/Psych       Oriented x3: Yes    Mood/Affect: Normal                  Slit Lamp and Fundus Exam       External Exam         Right Left    External Normal ocular adnexae, lacrimal gland & drainage, orbits Normal ocular adnexae, lacrimal gland & drainage, orbits              Slit Lamp Exam  Right Left    Lids/Lashes 3+ Dermatochalasis - upper lid, 2+ Blepharitis 3+ Dermatochalasis - upper lid, 2+ Blepharitis    Conjunctiva/Sclera Normal bulbar/palpebral, conjunctiva, sclera Normal bulbar/palpebral, conjunctiva, sclera    Cornea scar at 12:00, <64mm epi defect with central haze superotemporal to and abutting previous scar, Punctate stain, Haze Normal epithelium, stroma, endothelium, tear film    Anterior Chamber Clear & deep Clear & deep    Iris Normal shape, size, morphology  Normal shape, size, morphology    Lens 1+ Nuclear sclerosis 1+ Nuclear sclerosis                                No results found for: HA1C      Assessment/Plan:       #Marginal keratitis, right eye   -pt with recurrence of symptoms just 3 days after stopping tobradex  and adjacent to prior lesion  -this is c/f possible antibacterial resistance vs new lesion  -start Vigamox QID OD  -continue AT QID OD  -f/up with Dr. Bernardino in ~5 days - consider starting steroid if epi defect has resolved    #Blepharitis, both eyes  -may be triggering staph marginal  -start lid scrubs OU  -f/up as above      Patient seen and discussed with supervising physician, Dr. Daria Dunk.    RTC precautions reviewed; if experiencing new changes in vision and eye pain or if your symptoms do not improve or continue to worsen despite these measures, please feel free to call us  anytime at [phone]     Doyal Loras Pac, MD   Ophthalmology R2  05/09/2024 at 12:19 PM    I personally saw and evaluated the patient. I agree with the resident's findings and plan of care as documented above. I personally discussed exam findings and plan with the patient in details and answered questions.      Rosi Daria Dunk, MBBS

## 2024-05-09 NOTE — Patient Instructions (Signed)
 Start Vigamox four times daily in the right eye    Continue artificial tears four times daily    Start lid scrubs daily for both eyes (either baby shampoo on a cotton round or Ocusoft lid scrubs)

## 2024-05-09 NOTE — Addendum Note (Signed)
 Addended by: DARIA CATHELINE BOUCHE on: 05/09/2024 12:32 PM     Modules accepted: Level of Service

## 2024-05-11 ENCOUNTER — Other Ambulatory Visit: Payer: Self-pay

## 2024-05-12 ENCOUNTER — Encounter: Payer: Self-pay | Admitting: Primary Care

## 2024-05-12 ENCOUNTER — Ambulatory Visit: Attending: Primary Care | Admitting: Primary Care

## 2024-05-12 VITALS — BP 128/62 | HR 72 | Ht 63.75 in | Wt 113.0 lb

## 2024-05-12 DIAGNOSIS — E7800 Pure hypercholesterolemia, unspecified: Secondary | ICD-10-CM | POA: Insufficient documentation

## 2024-05-12 DIAGNOSIS — N3281 Overactive bladder: Secondary | ICD-10-CM | POA: Insufficient documentation

## 2024-05-12 DIAGNOSIS — H903 Sensorineural hearing loss, bilateral: Secondary | ICD-10-CM | POA: Insufficient documentation

## 2024-05-12 DIAGNOSIS — E559 Vitamin D deficiency, unspecified: Secondary | ICD-10-CM | POA: Insufficient documentation

## 2024-05-12 DIAGNOSIS — I10 Essential (primary) hypertension: Secondary | ICD-10-CM | POA: Insufficient documentation

## 2024-05-12 DIAGNOSIS — E034 Atrophy of thyroid (acquired): Secondary | ICD-10-CM | POA: Insufficient documentation

## 2024-05-12 DIAGNOSIS — Z0000 Encounter for general adult medical examination without abnormal findings: Secondary | ICD-10-CM | POA: Insufficient documentation

## 2024-05-12 MED ORDER — MIRABEGRON ER 25 MG PO TB24 *I*: 25.0000 mg | ORAL_TABLET | Freq: Every day | ORAL | 3 refills | Status: DC

## 2024-05-12 NOTE — Patient Instructions (Signed)
 Thank you for completing your Subsequent Annual Medicare Visit and Follow-up   with us  today.     The purpose of this visits was to:    Screen for disease  Assess risk of future medical problems  Help develop a healthy lifestyle  Update vaccines  Get to know your doctor in case of an illness    Patient Care Team:  Glaspie, Megan (Inactive) as PCP - CCP-AUDIOLOGY  Daina Moats, MD as PCP - General  Donelda Sharlet Holts, AUD as PCP - CCP-AUDIOLOGY  Audiology, [place]  Digiovanni, Benedict, MD (Orthopedic Surgery)     Medicare 5 Year Plan    The following items were identified as areas of concern during your screening today:  High Blood Pressure (Hypertension) - This is a risk factor for Heart Attack, Stroke, Kidney Problems and Eye Problems.     The Health Maintenance table below identifies screening tests and immunizations recommended by your health care team:  Health Maintenance: These screening recommendations are based on [place], [employer], and [place] state guidelines   Topic Date Due   . Shingles Vaccine (2 of 3) 03/27/2014   . Colon Cancer Screening  07/12/2022   . COVID-19 Vaccine (7 - 2024-25 season) 02/15/2024   . HIV Screening  08/08/2040 (Originally 12/04/1968)   . Osteoporosis Screening  07/25/2024   . Breast Cancer Screening  02/13/2025   . Depression - Yearly  05/12/2025   . Fall Risk Screening  05/12/2025   . Cervical Cancer Screening  04/22/2027   . DTaP/Tdap/Td Vaccines (4 - Td or Tdap) 12/16/2028   . Flu Shot  Completed   . Hepatitis C Screening  Completed   . Pneumococcal Vaccination  Completed   . Hepatitis B Vaccine  Aged Out   . HIB Vaccine  Aged Out   . HPV Vaccine  Aged Out   . Meningococcal Vaccine  Aged Out   . Rotavirus Vaccine  Aged Out   . Meningitis Vaccine  Aged Out     In addition, goals and orders placed to address these recommendations are listed in the Today's Visit section.    We wish you the best of health and look forward to seeing you again next year for your Annual  Medicare Wellness Visit.     If you have any health care concerns before then, please do not hesitate to contact us .
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
## 2024-05-13 ENCOUNTER — Other Ambulatory Visit: Payer: Self-pay

## 2024-05-14 ENCOUNTER — Ambulatory Visit: Attending: Optometry | Admitting: Optometry

## 2024-05-14 ENCOUNTER — Encounter: Payer: Self-pay | Admitting: Optometry

## 2024-05-14 DIAGNOSIS — H25813 Combined forms of age-related cataract, bilateral: Secondary | ICD-10-CM | POA: Insufficient documentation

## 2024-05-14 DIAGNOSIS — H168 Other keratitis: Secondary | ICD-10-CM | POA: Insufficient documentation

## 2024-05-14 NOTE — Progress Notes (Signed)
 Assessment:       1. Marginal keratitis (Primary)  SMI x2 OD, resolving    2. Combined forms of age-related cataract, bilateral            Plan:     Bruder mask warm compress x 10 minutes twice daily followed by HypoChlor spray to closed lids twice daily with massage of the lids with the back of the index finger from the bony rim toward the lashes. Increase water intake, decrease caffeine, use Systane Complete PF or Systane gel drop lubricating drops as needed. Continue moxi x 2 days or less if supply exhausted, resume residual tobradex  3 times daily until supply exhausted.

## 2024-05-19 DIAGNOSIS — N3281 Overactive bladder: Secondary | ICD-10-CM

## 2024-05-19 HISTORY — DX: Overactive bladder: N32.81

## 2024-05-20 ENCOUNTER — Ambulatory Visit

## 2024-05-22 ENCOUNTER — Ambulatory Visit

## 2024-05-29 ENCOUNTER — Other Ambulatory Visit: Payer: Self-pay

## 2024-05-29 ENCOUNTER — Ambulatory Visit

## 2024-05-29 VITALS — BP 120/82 | HR 70 | Temp 98.0°F | Ht 63.75 in | Wt 111.0 lb

## 2024-05-29 DIAGNOSIS — J019 Acute sinusitis, unspecified: Secondary | ICD-10-CM | POA: Insufficient documentation

## 2024-05-29 DIAGNOSIS — B9689 Other specified bacterial agents as the cause of diseases classified elsewhere: Secondary | ICD-10-CM | POA: Insufficient documentation

## 2024-05-29 DIAGNOSIS — H6122 Impacted cerumen, left ear: Secondary | ICD-10-CM | POA: Insufficient documentation

## 2024-05-29 MED ORDER — DOXYCYCLINE HYCLATE 100 MG PO TABS *I*
100.0000 mg | ORAL_TABLET | Freq: Two times a day (BID) | ORAL | 0 refills | Status: AC
Start: 2024-05-29 — End: 2024-06-08

## 2024-05-29 NOTE — Progress Notes (Signed)
 05/29/2024  REASON FOR VISIT: Sinus Problem    SUBJECTIVE:    [name] is a 68 y.o. female with a history of allergic rhinitis who presents today for URI symptoms since Saturday. Started while she was outside. Endorses fatigue, ear fullness, PND and sinus headaches. Having nasal congestion, but no discolored mucus. Reports symptoms are consistent with previous sinus infection. Denies fever, chills and body aches. Rarely has dry cough. No sore throat. Symptoms worsened yesterday. They have tried asteopro, mucinex, Sudafed, and flonase . The patient took an at home COVID test, which was negative. Husband is also sick. Reports they were outside of [facility] all weekend. Denies sick contacts.     OBJECTIVE: BP 120/82   Pulse 70   Temp 36.7 C (98 F) (Temporal)   Ht 1.619 m (5' 3.75)   Wt 50.3 kg (111 lb)   SpO2 100%   BMI 19.20 kg/m   Physical Exam  Vitals reviewed.   Constitutional:       Appearance: She is not ill-appearing.   HENT:      Head: Normocephalic and atraumatic.      Right Ear: Tympanic membrane, ear canal and external ear normal. Tympanic membrane is not injected.      Left Ear: Ear canal and external ear normal. There is impacted cerumen.      Nose: Congestion present. No rhinorrhea.      Right Turbinates: Not swollen.      Left Turbinates: Not swollen.      Right Sinus: Maxillary sinus tenderness and frontal sinus tenderness present.      Left Sinus: Maxillary sinus tenderness and frontal sinus tenderness present.      Mouth/Throat:      Mouth: Mucous membranes are moist.      Pharynx: No posterior oropharyngeal erythema.      Tonsils: No tonsillar exudate.   Cardiovascular:      Rate and Rhythm: Normal rate and regular rhythm.   Pulmonary:      Effort: Pulmonary effort is normal.      Breath sounds: Normal breath sounds. No decreased breath sounds, wheezing, rhonchi or rales.   Musculoskeletal:      Cervical back: Neck supple.   Lymphadenopathy:      Cervical: No cervical adenopathy.   Skin:      General: Skin is warm.           ASSESSMENT & PLAN:   1. Acute bacterial sinusitis (Primary)  Symptoms consistent with patient's recurrent sinus infections.  - doxycycline  hyclate (VIBRA -TABS) 100 mg tablet; Take 1 tablet (100 mg total) by mouth every 12 hours for 10 days for Acute Bacterial Infection of the Sinuses.  Dispense: 20 tablet; Refill: 0.   --Patient was recommended to take this medication with food.  Cautioned that this medication causes photosensitivity, recommended to avoid direct sunlight and wear sunscreen outdoors  - Patient encouraged to continue supportive measures Danikah Budzik as getting adequate rest, keeping hydrated, drinking warm liquids, humidified air, over-the-counter cold medications as needed  -The patient was instructed to call the office if symptoms worsen or fail to improve    2. Left ear impacted cerumen  Has appointment for ear flush with me in 2 weeks  -Use Debrox drops prior to appointment    RTC precautions discussed.     Patient Instructions   Please use Debrox wax softening drops per package instructions for 5 days prior to upcoming ear flush appointment    ORDERS:  No orders of the  defined types were placed in this encounter.    FOLLOW-UP: Follow up for As Scheduled.      Nat Mardy Lucier, PA-C    This note was created in part by using [employer] copilot and Psychiatrist. The note was reviewed, but may contain transcription errors or typographical errors.

## 2024-05-29 NOTE — Patient Instructions (Signed)
 Please use Debrox wax softening drops per package instructions for 5 days prior to upcoming ear flush appointment

## 2024-06-02 ENCOUNTER — Other Ambulatory Visit: Payer: Self-pay

## 2024-06-02 ENCOUNTER — Encounter: Payer: Self-pay | Admitting: Gastroenterology

## 2024-06-02 ENCOUNTER — Telehealth: Payer: Self-pay | Admitting: Ophthalmology

## 2024-06-02 NOTE — Telephone Encounter (Signed)
 Regarding Eye Surgery for PT: [name], Yesenia L.    Your eye surgery has been scheduled with Dr. Bonnita Roel Crouch at [facility].    Attached you will see a [facility] Procedure Prep Brochure. This brochure will have specific instructions regarding what you need to do prior to surgery.  Please read it thoroughly and follow the instructions included.   *However, DO NOT stop taking any medications without instruction from the PERSCRIBING doctor.   *The prescreening nurse can answer any questions you may have when they call you 2-3 days prior to surgery.    Monday, June 30, 2024 - The Surgical Center will call you between 2:30 pm and 4:30 pm with your arrival time for day of surgery. They will leave a detailed message if you miss the call.    Preoperative Instructions:   Do not eat after midnight the night before surgery!  This includes candy, mints, gum, lozenges or chewing tobacco for patients over 18.  You can drink as much clear liquids as you'd like until 3 hours before your arrival time.    Allowed Clear Liquids: Water, Ginger Ale, Apple Juice or Gatorade (or Pedialyte for children) ONLY.  Do not consume any other liquids, no apple cider, no orange juice, no coffee.  Do not to wear eye make-up. Do not to wear dark nail polish. If you wear contact lenses, we ask that you please wear your glasses for no less than three days prior to your procedure and remove any false eyelashes if you have them.  Do not use any eye ointments the night before and the morning of surgery. You may use eye drops the night before and the morning of surgery.  Please bring your photo ID and insurance card. Leave all jewelry, credit cards, cash and other valuables at home.    Tuesday, July 01, 2024 - Surgery - Please report to the [facility] ([address], [place], [place] [address]) Rosine will need a personal driver to take you home, ride shares like UBER and LYFT are not adequate forms of  transportation after surgery.    Wednesday, July 02, 2024 9:30a - Please report to Dr. Pearle office for the follow up post- op appointment at [facility] ([address] [place], [place] [address]). You will need an adult driver to accompany you.           Wednesday, July 09, 2024 9:00a - Please report to Dr. Pearle office for the follow up post- op appointment at [facility] ([address] [place], [place] [address]). You may drive yourself to your post op visit if you feel comfortable driving, if not then please have an adult driver accompany you.     If you have any questions, please feel free to call me at [phone] or e-mail me at [email or web address]  Thank You,  Olam Alt  Ophthalmology Lead Surgical Scheduler   [facility] [place] [facility] [place]  Phone: [phone] Fax: [phone]  [address]  [place], [place]  [address]    ICARE:  Retail buyer, Compassion, Accountability, Respect, Excellence    *Confidential Information Intended for Use of Addressee Only: This information has been disclosed to you from confidential records, which are protected by [place]  State law HIPAA. State law prohibits you from making any further disclosure of this information without the specific written consent of the person to whom it pertains, or as  otherwise permitted by law. A general authorization for the release of medical or other information is not sufficient authorization for further disclosure of information, which is protected by [place]  [facility] law, Article 27-F or Title 42 of the Code of Federal Regulations Any unauthorized further disclosure is violation of State law may result in a fine or jail sentence or both.

## 2024-06-03 ENCOUNTER — Ambulatory Visit: Attending: Neurology | Admitting: Neurology

## 2024-06-03 ENCOUNTER — Encounter: Payer: Self-pay | Admitting: Gastroenterology

## 2024-06-03 VITALS — BP 132/72 | HR 72 | Temp 98.1°F | Wt 111.0 lb

## 2024-06-03 DIAGNOSIS — R251 Tremor, unspecified: Secondary | ICD-10-CM | POA: Insufficient documentation

## 2024-06-03 NOTE — Patient Instructions (Signed)
 Please reach out to the clinic with any additional questions or concerns. We encourage you to sign up and use MyChart for any non-urgent medical questions as MyChart is the preferred method of communication.  Please contact your pharmacy for medication refills and allow up to 2 business days for routine prescription refills.  For urgent messages, please call the clinic at [phone].  The clinic is open from 8am to 4:30pm Monday through Friday.    You can get [facility] appointment reminders by text - please make sure we have your cell phone number on file at check in/check-out.  Then you can just text [facility] to [phone] to receive appointment messages.     No change in tremor  Will make no changes in medications  Follow up in 6 months

## 2024-06-03 NOTE — Progress Notes (Signed)
 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    She has been doing well since her last visit in January.  She has had no new neurologic issues.  There have been no seizures.  She is tolerating her medications well.    She continues to work 15 hours a week which she finds meaningful.  She has noted a slight worsening in her tremor although her husband believes that it is actually improved from her last visit.    She continues to have difficulty with eating soup and holding objects.    She is scheduled for cataract surgery later this fall.  She is also undergoing evaluation for hearing loss.    She continues to undergo physical therapy once weekly at [facility].    Examination:  Vitals:    06/03/24 0927   BP: 132/72   Pulse: 72   Temp: 36.7 C (98.1 F)   Weight: 50.3 kg (111 lb)     She is alert and oriented.  Her speech is clear.  Comprehension is fully intact.  Affect is appropriate.  Fluency is intact.  She has full visual field testing.  Versions are also full.  She has no nystagmus.  She has normal facial strength.  She continues to have a mild sustention tremor which does worsen with motion.  It appears roughly similar to what I noted last January.  She repeated her Archimedes spiral today.    Impression: 68 year old right-handed woman with a history of arteriovenous malformation, status post resection and residual seizures.  She is scheduled for cataract surgery encouraged her to make sure that she is able to take her Tegretol  on the day of the procedure.  I plan on seeing her again in 6 months.  Hold on treatment for her tremor for now.

## 2024-06-04 DIAGNOSIS — I10 Essential (primary) hypertension: Secondary | ICD-10-CM | POA: Diagnosis not present

## 2024-06-04 DIAGNOSIS — Z8679 Personal history of other diseases of the circulatory system: Secondary | ICD-10-CM | POA: Diagnosis not present

## 2024-06-04 DIAGNOSIS — Z9889 Other specified postprocedural states: Secondary | ICD-10-CM | POA: Diagnosis not present

## 2024-06-04 DIAGNOSIS — E785 Hyperlipidemia, unspecified: Secondary | ICD-10-CM | POA: Diagnosis not present

## 2024-06-05 NOTE — Addendum Note (Signed)
 Addended byBETHA ONETA LOBOS on: 06/05/2024 01:54 PM     Modules accepted: Orders

## 2024-06-09 ENCOUNTER — Other Ambulatory Visit: Payer: Self-pay | Admitting: *Deleted

## 2024-06-09 MED ORDER — METOPROLOL SUCCINATE ER 25 MG PO TB24: 25.0000 mg | ORAL_TABLET | Freq: Two times a day (BID) | ORAL | 3 refills | Status: DC

## 2024-06-11 NOTE — Progress Notes (Signed)
 06/13/2024  REASON FOR VISIT: ear flush      SUBJECTIVE:   [name] is a 68 y.o. female who presents today for left ear fullness and diminished hearing. Denies otalgia, otorrhea, tinnitus, vertigo.  Seen for acute bacterial sinusitis 2 weeks ago. Feeling better. The patient does not wear hearing aids but is seeing an audiologist to be fitted for hearing aids in the next few weeks. The patient has instilled debrox daily for several days.       OBJECTIVE: BP 124/80   Pulse 76   Ht 1.6 m (5' 3)   Wt 50.8 kg (112 lb)   SpO2 100%   BMI 19.84 kg/m   PRE-PROCEDURE EXAM: Left TM cannot be visualized due to total occlusion/impaction of the ear canal. Right  TM is gray and opaque without erythema or injection  PROCEDURE INDICATION: remove wax to visualize ear drum & improve hearing & relieve discomfort  CONSENT: Verbal  PROCEDURE NOTE:   LEFT EAR:  The ear was irrigated with warm water to free up and remove the wax bolus. The ear was then flushed again with warm water to remove any remaining small bits of wax.   POST- PROCEDURE EXAM: TMs successfully visualized and found to be intact with no redness or perforation. 98% of ear wax had been removed from the ear canals.  POST-PROCEDURE INSTRUCTIONS: avoid use of Q-tips.    ASSESSMENT & PLAN:  1. Cerumen Impaction of the Left Ear   - Cerumen removed without difficulty    - No further intervention indicated     There are no Patient Instructions on file for this visit.      Nat Lyell Clugston, PA-C

## 2024-06-12 ENCOUNTER — Other Ambulatory Visit: Payer: Self-pay

## 2024-06-13 ENCOUNTER — Ambulatory Visit

## 2024-06-13 VITALS — BP 124/80 | HR 76 | Ht 63.0 in | Wt 112.0 lb

## 2024-06-13 DIAGNOSIS — H6122 Impacted cerumen, left ear: Secondary | ICD-10-CM | POA: Insufficient documentation

## 2024-06-17 ENCOUNTER — Other Ambulatory Visit: Payer: Self-pay | Admitting: Primary Care

## 2024-06-17 ENCOUNTER — Telehealth: Payer: Self-pay | Admitting: Ophthalmology

## 2024-06-17 ENCOUNTER — Ambulatory Visit

## 2024-06-17 NOTE — Telephone Encounter (Signed)
 Regarding Eye Surgery for PT: [name], Yesenia L.    Your eye surgery has been scheduled with Dr. Bonnita Roel Crouch at [facility].    Attached you will see a [facility] Procedure Prep Brochure. This brochure will have specific instructions regarding what you need to do prior to surgery.  Please read it thoroughly and follow the instructions included.   *However, DO NOT stop taking any medications without instruction from the PERSCRIBING doctor.   *The prescreening nurse can answer any questions you may have when they call you 2-3 days prior to surgery.    Monday, July 14, 2024 - The Surgical Center will call you between 2:30 pm and 4:30 pm with your arrival time for day of surgery. They will leave a detailed message if you miss the call.    Preoperative Instructions:   Do not eat after midnight the night before surgery!  This includes candy, mints, gum, lozenges or chewing tobacco for patients over 18.  You can drink as much clear liquids as you'd like until 3 hours before your arrival time.    Allowed Clear Liquids: Water, Ginger Ale, Apple Juice or Gatorade (or Pedialyte for children) ONLY.  Do not consume any other liquids, no apple cider, no orange juice, no coffee.  Do not to wear eye make-up. Do not to wear dark nail polish. If you wear contact lenses, we ask that you please wear your glasses for no less than three days prior to your procedure and remove any false eyelashes if you have them.  Do not use any eye ointments the night before and the morning of surgery. You may use eye drops the night before and the morning of surgery.  Please bring your photo ID and insurance card. Leave all jewelry, credit cards, cash and other valuables at home.    Tuesday, July 15, 2024 - Surgery - Please report to the [facility] ([address], [place], [place] [address]) Rosine will need a personal driver to take you home, ride shares like UBER and LYFT are not adequate forms of  transportation after surgery.    Wednesday, July 16, 2024 1:00p - Please report to Dr. Pearle office for the follow up post- op appointment at [facility] ([address] [place], [place] [address]). You will need an adult driver to accompany you.             Friday, July 25, 2024 3:00p - Please report to Dr. Murel office for the follow up post- op appointment at [facility] ([address] [place], [place] [address]). You may drive yourself to your post op visit if you feel comfortable driving, if not then please have an adult driver accompany you.     If you have any questions, please feel free to call me at [phone] or e-mail me at [email or web address]  Thank You,  Olam Alt  Ophthalmology Lead Surgical Scheduler   [facility] [place]  Phone: [phone] Fax: [phone]  [address] [place]  [address]    ICARE:  Retail buyer, Compassion, Accountability, Respect, Excellence    *Confidential Information Intended for Use of Addressee Only: This information has been disclosed to you from confidential records, which are protected by [place]  State law HIPAA. State law prohibits you from making any further disclosure of this information without the specific written consent of the person to whom it  pertains, or as otherwise permitted by law. A general authorization for the release of medical or other information is not sufficient authorization for further disclosure of information, which is protected by [place]  [facility] law, Article 27-F or Title 42 of the Code of Federal Regulations Any unauthorized further disclosure is violation of State law may result in a fine or jail sentence or both.

## 2024-06-18 ENCOUNTER — Ambulatory Visit: Payer: Medicare Other

## 2024-06-18 DIAGNOSIS — I495 Sick sinus syndrome: Secondary | ICD-10-CM

## 2024-06-18 LAB — CUP PACEART REMOTE DEVICE CHECK
Battery Remaining Longevity: 145 mo
Battery Voltage: 3.03 V
Brady Statistic AP VP Percent: 0.04 %
Brady Statistic AP VS Percent: 97.6 %
Brady Statistic AS VP Percent: 0 %
Brady Statistic AS VS Percent: 2.37 %
Brady Statistic RA Percent Paced: 97.98 %
Brady Statistic RV Percent Paced: 0.04 %
Date Time Interrogation Session: 20250727212038
Implantable Lead Connection Status: 753985
Implantable Lead Connection Status: 753985
Implantable Lead Implant Date: 20231101
Implantable Lead Implant Date: 20231101
Implantable Lead Location: 753859
Implantable Lead Location: 753860
Implantable Lead Model: 3830
Implantable Lead Model: 5076
Implantable Pulse Generator Implant Date: 20231101
Lead Channel Impedance Value: 323 Ohm
Lead Channel Impedance Value: 361 Ohm
Lead Channel Impedance Value: 456 Ohm
Lead Channel Impedance Value: 513 Ohm
Lead Channel Pacing Threshold Amplitude: 0.625 V
Lead Channel Pacing Threshold Amplitude: 0.875 V
Lead Channel Pacing Threshold Pulse Width: 0.4 ms
Lead Channel Pacing Threshold Pulse Width: 0.4 ms
Lead Channel Sensing Intrinsic Amplitude: 15.5 mV
Lead Channel Sensing Intrinsic Amplitude: 15.5 mV
Lead Channel Sensing Intrinsic Amplitude: 2.25 mV
Lead Channel Sensing Intrinsic Amplitude: 2.25 mV
Lead Channel Setting Pacing Amplitude: 1.5 V
Lead Channel Setting Pacing Amplitude: 2 V
Lead Channel Setting Pacing Pulse Width: 0.4 ms
Lead Channel Setting Sensing Sensitivity: 1.2 mV
Zone Setting Status: 755011

## 2024-06-18 NOTE — Discharge Instructions (Addendum)
 Center for Perioperative Medicine Preoperative Instructions- [facility]      Patient Name: [name]    When to [facility] for Surgery     On the day prior to your surgery, you will find out your arrival time. [facility] - You will be contacted with your arrival time between 2:00 and 5:00 p.m. on the day prior to your procedure. You do not need to call yourself, however, if you have questions please call [phone].SABRA    Note: Patients scheduled for a procedure on Monday will be assigned an arrival time on the Friday before. Please note surgery start time is approximate. You may want to bring something to help pass the time.    PLEASE ARRIVE ON TIME.    Directions to Surgical Center              [facility], [address], [place], [place], [address].   Please enter through the main front door and you will find the entrance to the surgical reception area on your right.  Please stop at the reception desk to check in.    Eating Guidelines    Follow the instructions below unless otherwise instructed by your physician.    No solid food AFTER MIDNIGHT on the day of your surgery. No candy, gum, mints or chewing tobacco.    You can have clear liquids up to 2 hours before your surgery. This includes water, apple juice,  clear carbonated beverages,  black coffee,  clear tea. No milk, cream, or non-dairy creamers.     Failure to follow these instructions, could lead to a delay or cancellation of your procedure.    Medication Guidelines    On the morning of surgery, take only the medications indicated on the Preoperative Medication List above.    Medications should only be taken with no more than one ounce of water.    You may take Tylenol (Acetaminophen) if needed.Do not take powder supplements or Metamucil on the day of the procedure.    Additional Information    What to bring or wear:    Bring Photo ID and  insurance information.If you have a medical implant or stimulator device with an external controller, please bring the remote control with you the day of surgery.    Eye glasses and/or hearing aids:  These may be removed prior to surgery so be prepared to leave them with a trusted family member.    Do NOT wear contact lenses.    Wear comfortable, loose fit clothing. For eye surgeries, PLEASE WEAR A SHORT SLEEVED, LOOSE SHIRT ON THE DAY OF SURGERY.    You must arrange a ride home before coming to surgery.    What NOT to bring or wear:    Before coming to the hospital, remove all makeup (including mascara), jewelry (including wedding band and watch), hair accessories and nail polish from toes and fingers.  Do not bring any valuables (money, wallet, purse, jewelry, or contact lenses.)    Information for After Surgery:  Visitor Policy [phone] HoursVisiting hours are between 8 a.m. and 8 p.m.Visitation for Patients Admitted to the HospitalTwo visitors, ages 90 or older, are allowed at the bedside at a time on inpatient units except in the ICUs, where visitors must be 74 or older.  Exceptions for other hospital areas are noted below. To keep everyone safe, medical masks are still required. Individuals who are sick or believe they may have COVID may not visit. Visitors  will be asked to attest that they are symptom/COVID free.ED and Observation Units: one visitor at a time with the patient, allowing for a second designated visitor to alternate with them throughout the ED visit when volume and social distancing allows. If waiting room is too busy, visitors will have to be asked to leave.For OB patients in labor: one identified support person and and two visitors will be permitted at the bedside at a time.Cardiac Cath Lab or Electrophysiology: one visitor may accompany patient during pre-surgical process; the visitor may remain socially distanced in  designated waiting room during the surgery.Dialysis Unit and Interventional Radiology: no visitors permitted, as space does not allow for social distancing.Developmental Disabilities/Cognitive Impairment Patients: one support person, no time limitations, as care team deems necessary.Pediatric Patients: Two individuals at the bedside at a time. Up to two parents/guardians may stay overnight, depending on unit. Please check with unit leadership. Visit the [facility] website for more details.Exceptions remain in place; visitors should consult the hospital's updated web site before visiting.Guidelines for Patients Having Surgery 2 visitor may accompany the patient during the pre-surgical process. The visitors can remain socially distanced in the surgical waiting room. If the patient is admitted, visitation/exception guidelines will be followed.Guidelines for Patients Having Surgery or a procedure in the Cardiac Cath Lab or Electrophysiology Lab2 visitor may accompany the patient during the pre-surgical process. The visitors can remain socially distanced in the surgical waiting room. If the patient is admitted, visitation/exception guidelines will be followed.All visitors must be at least 70 years old.Exception guidelines will still be followed.    Your family will be notified when your surgery is completed and you have arrived on the patient care unit.    Expected Length of Stay:    ASC Admission: You are going home on the same day as your surgery.  Most people are ready for discharge one half hour to two hours after returning to the Surgical Center from the Post-Anesthesia Care Unit (PACU).  You will be given discharge instructions before you go home.    Health standards require that a responsible adult must accompany any patient who has received anesthetics or sedation and is going home the same day. You must arrange a ride home before coming  to surgery.    Questions:  Please call the Center for Perioperative Medicine at [phone] between 8:00 a.m. and 4:00 p.m. Monday through Friday. You were seen today by Lesleigh Blanch, PA     Additional information for [facility]:  [email or web address]    Surgical Site Infections FAQs    What is a Surgical Site infection (SSI)?A surgical site infection is an infection that occurs after surgery In the part of the body where the surgery took place. Most patients who have surgery do not develop an infection. However, Infections develop in about 1 to 3 out of every 100 patients who have surgery.     Some of the common symptoms of a surgical site infection are:  Redness and pain around the area where you had surgery  Drainage of cloudy fluid from your surgical wound  Fever     Can SSIs be treated? Yes. Most surgical site infections can be treated with antibiotics. The antibiotic given to you depends on the bacteria (germs) causing the Infection. Sometimes patients with SSIs also need another surgery to treat the infection.     What are some of the things that hospitals are doing to prevent SSls? To prevent SSIs, doctors, nurses, and  other healthcare providers:  Clean their hands and arms up to their elbows with an antiseptic agent just before the surgery.  Clean their hands with soap and water or an alcohol-based hand rub before and after caring for each patient.  May remove some of your hair Immediately before your surgery using electric clippers If the hair Is in the same area where the procedure will occur. Wear special hair covers, masks, gowns, and gloves during surgery to keep the surgery area clean.  Give you antibiotics before your surgery starts. in most cases, you should get antibiotics within 60 mInutes before the surgery starts and the antibiotics should be stopped within 24 hours after surgery.  Clean the skin  at the site of your surgery with a special soap that kills germs.     What can I do to help prevent SSIs? Before your surgery:  Tell your doctor about other medical problems you may have. Health problems such as allergies, diabetes, and obesity could affect your surgery and your treatment.  Quit smoking. Patients who smoke get more Infections. Talk to your doctor about how you can quit before your surgery.  Do not remove any hair near where you will have surgery. This can Irritate your skin and make it easier to develop an infection.         After your surgery:  Make sure that your healthcare providers clean their hands before examining you, either with soap and water or an alcohol-based hand rub. If you do not see your healthcare providers wash their hands, please ask them to do so.   Family and friends who visit you should not touch the surgical wound or dressings.  Family and friends should clean their hands with soap and water or an alcohol-based hand rub before and after visiting you. If you do not see them clean their hands, ask them to clean their hands. What do I need to do when I go home from the hospital?  Before you go home, your doctor or nurse should explain everyt hing you need to know about taking care of your wound. Make sure you understand how to care for your wound before you leave the hospital.  Always clean your hands before and after caring for your wound.  Before you go home, make sure you know who to contact If you have questions or problems after you get home.  If you have any symptoms of an Infection, such as redness and pain at the surgery site, drainage, or fever, call your doctor immediately. if you have additional questions, Please ask your doctor or nurse.

## 2024-06-20 ENCOUNTER — Telehealth: Payer: Self-pay

## 2024-06-20 ENCOUNTER — Ambulatory Visit: Payer: Self-pay | Admitting: Internal Medicine

## 2024-06-22 ENCOUNTER — Other Ambulatory Visit: Payer: Self-pay

## 2024-06-22 ENCOUNTER — Encounter: Payer: Self-pay | Admitting: Physician Assistant

## 2024-06-22 NOTE — Provider Consult (Signed)
 CPM Optimization ConsultAnesthesia Consult Recommendations:Patient is {Proceed or optimize:51300}.  Final plan to be determined by anesthesia on day of surgery. See full note for details.Date of Service:  06/22/24 Patient's Name: Yesenia Hobday JohnstonPatient's Age:  68 y.o.Primary Care Physician: Daina Moats, MDRequesting Physician: Dr. BONNITA ROEL CROUCH, Ophthalmology Procedure: Left PHACOEMULSIFICATION, CATARACT, EXTRACAPSULAR with MAC Surgical date: 07/01/24 Surgical location: [facility] Discussed with CPM Attending: {cpmfaculty:52144} Intended purpose of this consult:  Optimization of medical co-morbidities and Assessment of the patient's risk of perioperative complications [name] is a 68 y.o. patient who presents for preoperative evaluation of medical comorbid conditions and risk assessment in anticipation of the above procedure.Prior Anesthesia:Anesthesia Complications: {Anesthesia Complications:53585}Most recent anesthesia reviewedAnesthesia Evaluation Information Source: records   ANESTHESIA HISTORYPertinent(-):  No History of anesthetic complications or Family hx of anesthetic complicationsGENERALComment: Denies recent fevers, rashes, open wounds/ sores**Pertinent (-):  No substance abuse, history of anesthetic complications or Family Hx of Anesthetic ComplicationsHEENT  + Visual Impairment (bilateral cataracts)  + Hearing Loss (Tinnitus)Pertinent (-):  No TMJD or neck painComment: Dental (as per pt): denies problems** PULMONARY  + Asthma        mild intermittentPertinent(-):  No smoking, snoring, sleep apnea or COPDCARDIOVASCULARGood(4+METs) Exercise Tolerance  + HypertensionPertinent(-):  No past MI, angina, CAD, valvular heart disease, anticoagulants/antiplatelet medications, dysrhythmias, cardiac device, CHF, vascular Issues or hx of DVTGI/HEPATIC/RENAL NPO: Enter Last PO Intake in  ROS/Med Hx Tab  + GERD  + Bowel Issues        IBSPertinent(-):  No PUD, alcohol use, liver  issues or renal issues  NEURO/PSYCH/ORTHO  + Cerebrovascular event (with Ruptured AVM / Seizure 2004)        CVA  Comment: TremorPertinent(-):  No neuropsychiatric issues or seizuresENDO/OTHER  + Thyroid  Disease        HYPOthyroid Pertinent(-):  No diabetes mellitus, menstruating (post-menopause)HEMATOLOGIC  + Blood dyscrasia        hyperlipidemia  + ArthritisPertinent(-):  No bruising/bleeding easily, coagulopathy or blood transfusion  Comment: Scoliosis / Osteoporosis  Physical Exam Not Completed Informed Consent   Risks:        Risks discussed were commensurate with the plan listed above with the following specific points: N/V, aspiration, sore throat, dizziness, unsteadiness and headache, Damage to: teeth.  Anesthetic Consent:       Anesthetic plan (and risks as noted above) were discussed with patient Functional CapacityDASI Score ***BP: ()/() ; There is no height or weight on file to calculate BMI.Pertinent Labs/Testing:CT Head without contrast 2019:1.  No significant interval change in comparison with the prior exam dated 03/30/2004. There is again noted evidence for left parietal craniotomy status post resection of left parietal AVM. There is a large zone of low-attenuation involving the left frontal and parietal lobes consistent with encephalomalacia and/or gliosis. 2.  There is no evidence for intracranial mass, hemorrhage or acute cortical infarction. 3.  There is mild right sphenoid sinus mucosal thickening. Significant Medical Co morbidities Evaluated: A. CardiovascularHTN - CCBHLD - statinRuptured AVM / CVA / Seizure 2004- Left Craniotomy / Repair - residual RLE weakness, Carbamazepine , followed by Neurol (Dr. Adelina Nelson note inidicates aware of this surgery)B. PulmonaryRAD - daily inhalerC.  Anesthesia Ammon considerationsGERD - Pepcid  / Famotidine  / AciphexD. ComorbiditiesTremor - followed by NeurolHypothyroidism - LevothyroxineScoliosos / Osteoporosis - ProliaTinnitusIBSOAB / Urge Incontinence - VesicareRisk Assessment: Following perioperative risk assessments were independently reviewed and communicated in detail with patient/family/caregiver at the time of this visit.Revised Cardiac Risk Estimate for NON cardiac surgery:Is this a  cardiac surgery: no:  RCRI indicated to assess risk MACE  Revised Cardiac Risk Index (Circ. 100:1043, 1999), the patient's risk factors for cardiac complications for NON Cardiac surgery include history of cerebrovascular disease putting them in: RCI RISK CLASS II (1 risk factor, risk of major cardiac compl. appr. 1.3%) ASA Physical Status: Class III: Severe Systemic DiseaseARISCAT Pulmonary Risk Assessment Completed: {Pulmonary Risk:50066}Risk assessment for select surgical procedures: NSQIP completed/ documented {yes no documentation below:50135}Assessment of Frailty using FRAIL scale: {Frailty assessment:44817:::0}Goal for Procedure from Patient perspective: ***Additional Perioperative considerations warranting further optimization or risk discussion: Patient: Biopsychosocial needs: For any identified concerns see plan belowAbnormal vital signs/physical examination findings likely to impact perioperative care= {yes no education below:50136}:Social support concerns= {yes no education below:50136}:Obesity BMI >30= no: Diabetes with elevated HgA1C or glucose= no: Anemia: Hgb <12= no: Transfusion of Blood or Blood products = {PBM choices:50319}Significant allergy to medications needed for the perioperative period = {Yes specifcally:50321}Tobacco Abuse Disorder= no: Chronic Opioid Use/buprenorphine/naltrexone use= no:OSA with pap therapy non-compliance= no: .  DVT/PE History and NO plan for  anticoagulation established=  wn:Jddzddfzwu and PLAN: [name] is a 68 y.o. person with a past medical history significant for multiple co -morbidities who presents to our clinic for preoperative evaluation. 2024 ACC AHA Guidelines for further cardiac testing:  {RRORTESTING:49183}Presence of any Cardiovascular risk factors, diseases or symptoms: HTN, dyslipidemia, and AGE (female >57, Female >55)Non-emergency surgeryPresence of any Modifiable perioperative risk factors: {Modifiable perioperative risk factors:49181}Combined Patient/Surgical perioperative Risk= {rrrisk:49992}DASI Score >34: {YES/NO:21037}{rrcardstesting:49184} Medical co-morbidities are currently at baseline as detailed above=  {no yes documentation:50137}Day of surgery Planning Considerations: Patient {IS/IS WNU:73186} a candidate for surgery at {Surgical location options:50231}Additional optimization needed: {ASCplanning:49994}Anesthesia planning: {Anesthesia plan:46413} {anesthesia considerations:52364}Anesthesia discussed with the patient in detail including general risks associated with anesthesia. All patient questions related to anesthesia were welcomed and answered in detail to their satisfaction.Optimization Plan:{Optimization Interventions:51301} Preoperative Med List   Notice  Cannot display patient medications because the patient has not yet been checked in.  Follow up recommended : {RRfollowup:50002}In preparation for this consultation a detailed review of the patient's medical information was completed including review of Surgeon, Specialist, and PCP notes, testing, imaging, and prior anesthesia record as appropriate. Patient's medications, allergies, past medical, surgical, social and family histories were reviewed and updated as appropriate.I personally spent *** minutes on the calendar date of the encounter, including pre and post visit work for this visit.  Lesleigh Blanch, PACenter for Perioperative Medicine- a part of the Department of Anesthesiology and Perioperative Medicine

## 2024-06-23 ENCOUNTER — Encounter: Payer: Self-pay | Admitting: Physician Assistant

## 2024-06-23 ENCOUNTER — Ambulatory Visit
Admission: RE | Admit: 2024-06-23 | Discharge: 2024-06-23 | Disposition: A | Discharge status: Home / Self Care | Source: Ambulatory Visit | Attending: Ophthalmology | Admitting: Ophthalmology

## 2024-06-23 ENCOUNTER — Other Ambulatory Visit: Payer: Self-pay | Admitting: Primary Care

## 2024-06-23 VITALS — BP 128/68 | HR 76 | Temp 97.3°F | Resp 16 | Ht 63.78 in | Wt 112.5 lb

## 2024-06-23 DIAGNOSIS — G40909 Epilepsy, unspecified, not intractable, without status epilepticus: Secondary | ICD-10-CM

## 2024-06-23 DIAGNOSIS — E785 Hyperlipidemia, unspecified: Secondary | ICD-10-CM

## 2024-06-23 DIAGNOSIS — E039 Hypothyroidism, unspecified: Secondary | ICD-10-CM

## 2024-06-23 DIAGNOSIS — Z01818 Encounter for other preprocedural examination: Secondary | ICD-10-CM | POA: Insufficient documentation

## 2024-06-23 DIAGNOSIS — K219 Gastro-esophageal reflux disease without esophagitis: Secondary | ICD-10-CM

## 2024-06-23 DIAGNOSIS — I10 Essential (primary) hypertension: Secondary | ICD-10-CM

## 2024-06-23 DIAGNOSIS — H25813 Combined forms of age-related cataract, bilateral: Secondary | ICD-10-CM | POA: Insufficient documentation

## 2024-06-23 DIAGNOSIS — Q2730 Arteriovenous malformation, site unspecified: Secondary | ICD-10-CM

## 2024-06-23 DIAGNOSIS — I639 Cerebral infarction, unspecified: Secondary | ICD-10-CM

## 2024-06-23 HISTORY — DX: Scoliosis, unspecified: M41.9

## 2024-06-23 HISTORY — DX: Arteriovenous malformation, site unspecified: Q27.30

## 2024-06-23 HISTORY — DX: Tinnitus, unspecified ear: H93.19

## 2024-06-23 HISTORY — DX: Other instability, left ankle: M25.372

## 2024-06-23 HISTORY — DX: Other instability, right ankle: M25.371

## 2024-06-24 ENCOUNTER — Other Ambulatory Visit: Payer: Self-pay

## 2024-06-26 ENCOUNTER — Other Ambulatory Visit: Payer: Self-pay

## 2024-06-26 NOTE — Anesthesia Preprocedure Evaluation (Addendum)
 Anesthesia Pre-operative History and Physical for Suda Forbess JohnstonHighlighted Issues for this Procedure:68 y.o. 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] presenting for Procedure(s) (LRB):PHACO W/ IOL; +/- CAPSULE STAIN (Left) by Surgeon(s):Celik, Bonnita Lobos, MD scheduled for 30 minutes.BMI Readings from Last 1 Encounters:06/23/24 : 19.44 kg/m.Anesthesia Evaluation Information Source: records, patient   HEENT  + Hearing Loss          Right Ear: HOH          Left Ear: HOH  + TMJD PULMONARY  + Asthma        mild intermittentPertinent(-):  No smokingCARDIOVASCULARGood(4+METs) Exercise Tolerance  + HypertensionPertinent(-):  No hx of DVTGI/HEPATIC/RENAL NPO: > 8hrs ago (solids) and > 2hrs ago (clears)  + GERD        well controlled  + Bowel Issues        IBSPertinent(-):  No liver  issues or renal issues  NEURO/PSYCH/ORTHO  + Chronic pain  + Seizures        well controlled  + Cerebrovascular event (ruptured AVM/ seizure 2004)Pertinent(-):  No         well controlledENDO/OTHER  + Thyroid  Disease        HYPOthyroid HEMATOLOGIC  + Blood dyscrasia        hyperlipidemia Physical ExamAirway          Mouth opening: normal          Mallampati: II          TM distance (fb): >3 FB          Neck ROM: fullDental   Normal Exam Comment: No loose or broken teeth.  Cardiovascular         Rhythm: regular         Rate: normalNo murmurNeurologic    Normal ExamGeneral Survey    Normal Exam Pulmonary   breath sounds clear to auscultation  No cough, rhonchiMental Status   Normal Exam ________________________________________________________________________PLANASA Score  2Anesthetic Plan MAC Induction (routine IV);  Airway Manipulation (none); Airway (nasal cannula); Line ( use current access); Monitoring (standard ASA); Positioning  (supine); Pain (per surgical team); PostOp (PACU)Standard AttestationInformed Consent   Risks:        Risks discussed were commensurate with the plan listed above with the following specific points: N/V and aspiration, Damage to: eyes and nerves, unexpected serious injury and allergic Rx.  Anesthetic Consent:       Anesthetic plan (and risks as noted above) were discussed with patient and spouse  Plan also discussed with team members including:     CRNA and attendingResponsible Anesthesia Provider Attestation:I attest that the patient or proxy understands and accepts the risks and benefits of the anesthesia plan. I also attest that I have personally performed a pre-anesthetic examination and evaluation, and prescribed the anesthetic plan for this particular location within 48 hours prior to the anesthetic as documented. Mervyn DELENA Dixons, MBChB  07/01/24, 10:36 AM

## 2024-06-30 ENCOUNTER — Telehealth: Payer: Self-pay

## 2024-06-30 ENCOUNTER — Other Ambulatory Visit: Payer: Self-pay

## 2024-07-01 ENCOUNTER — Ambulatory Visit: Payer: Self-pay | Admitting: Student in an Organized Health Care Education/Training Program

## 2024-07-01 ENCOUNTER — Encounter: Payer: Self-pay | Admitting: Student in an Organized Health Care Education/Training Program

## 2024-07-01 ENCOUNTER — Encounter: Payer: Self-pay | Admitting: Ophthalmology

## 2024-07-01 ENCOUNTER — Ambulatory Visit
Admission: RE | Admit: 2024-07-01 | Discharge: 2024-07-01 | Disposition: A | Discharge status: Home / Self Care | Source: Ambulatory Visit | Attending: Ophthalmology | Admitting: Ophthalmology

## 2024-07-01 ENCOUNTER — Encounter
Admission: RE | Disposition: A | Discharge status: Home / Self Care | Payer: Self-pay | Source: Ambulatory Visit | Attending: Ophthalmology

## 2024-07-01 ENCOUNTER — Other Ambulatory Visit: Payer: Self-pay

## 2024-07-01 DIAGNOSIS — H25812 Combined forms of age-related cataract, left eye: Secondary | ICD-10-CM

## 2024-07-01 DIAGNOSIS — I10 Essential (primary) hypertension: Secondary | ICD-10-CM | POA: Insufficient documentation

## 2024-07-01 DIAGNOSIS — Z9849 Cataract extraction status, unspecified eye: Secondary | ICD-10-CM | POA: Insufficient documentation

## 2024-07-01 DIAGNOSIS — H25813 Combined forms of age-related cataract, bilateral: Secondary | ICD-10-CM

## 2024-07-01 DIAGNOSIS — Z9842 Cataract extraction status, left eye: Secondary | ICD-10-CM

## 2024-07-01 DIAGNOSIS — J4520 Mild intermittent asthma, uncomplicated: Secondary | ICD-10-CM | POA: Insufficient documentation

## 2024-07-01 DIAGNOSIS — K219 Gastro-esophageal reflux disease without esophagitis: Secondary | ICD-10-CM | POA: Insufficient documentation

## 2024-07-01 HISTORY — PX: PR XCAPSL CTRC RMVL INSJ IO LENS PROSTH W/O ECP: 66984

## 2024-07-01 SURGERY — PHACOEMULSIFICATION, CATARACT, EXTRACAPSULAR
Anesthesia: Monitor Anesthesia Care | Site: Eye | Laterality: Left | Wound class: Clean

## 2024-07-01 MED ORDER — SODIUM CHLORIDE 0.9 % INJ (FLUSH) WRAPPED (FOR OSM ONLY) *I*
Status: DC | PRN
Start: 2024-07-01 — End: 2024-07-01
  Administered 2024-07-01: 10 mL via INTRAVENOUS

## 2024-07-01 MED ORDER — TROPICAMIDE 1 % OP SOLN *I*
OPHTHALMIC | Status: AC
Start: 2024-07-01 — End: 2024-07-01
  Filled 2024-07-01: qty 3

## 2024-07-01 MED ORDER — PREDNISOLONE ACETATE 1 % OP SUSP *I*
OPHTHALMIC | 0 refills | Status: DC
Start: 2024-07-01 — End: 2024-08-14

## 2024-07-01 MED ORDER — TROPICAMIDE 1 % OP SOLN *I*
1.0000 [drp] | OPHTHALMIC | Status: DC | PRN
Start: 2024-07-01 — End: 2024-07-02
  Administered 2024-07-01 (×3): 1 [drp] via OPHTHALMIC

## 2024-07-01 MED ORDER — ACETAMINOPHEN 325 MG PO TABS *I*
650.0000 mg | ORAL_TABLET | Freq: Once | ORAL | Status: AC
Start: 2024-07-01 — End: 2024-07-01
  Administered 2024-07-01: 650 mg via ORAL

## 2024-07-01 MED ORDER — PREDNISOLONE ACETATE 1 % OP SUSP *I*
OPHTHALMIC | Status: DC | PRN
Start: 2024-07-01 — End: 2024-07-01
  Administered 2024-07-01: 1 [drp] via OPHTHALMIC

## 2024-07-01 MED ORDER — TETRACAINE HCL 0.5 % OP SOLN *I*
OPHTHALMIC | Status: DC | PRN
Start: 2024-07-01 — End: 2024-07-01
  Administered 2024-07-01: 1 [drp] via OPHTHALMIC

## 2024-07-01 MED ORDER — ACETAMINOPHEN 160 MG/5 ML WRAPPED *I*
975.0000 mg | Freq: Once | Status: DC
Start: 2024-07-01 — End: 2024-07-01

## 2024-07-01 MED ORDER — PREDNISOLONE ACETATE 1 % OP SUSP *I*
OPHTHALMIC | 0 refills | Status: DC
Start: 2024-07-01 — End: 2024-08-14
  Filled 2024-07-01: qty 5, 15d supply, fill #0

## 2024-07-01 MED ORDER — BSS IO SOLN *I*
INTRAOCULAR | Status: DC | PRN
Start: 2024-07-01 — End: 2024-07-01
  Administered 2024-07-01: 15 mL via OPHTHALMIC

## 2024-07-01 MED ORDER — TETRACAINE HCL 0.5 % OP SOLN *I*
OPHTHALMIC | Status: AC
Start: 2024-07-01 — End: 2024-07-01
  Filled 2024-07-01: qty 1

## 2024-07-01 MED ORDER — FENTANYL CITRATE 50 MCG/ML IJ SOLN *WRAPPED*
INTRAMUSCULAR | Status: AC
Start: 2024-07-01 — End: 2024-07-01
  Filled 2024-07-01: qty 2

## 2024-07-01 MED ORDER — POVIDONE-IODINE 5 % OP SOLN *I*
OPHTHALMIC | Status: DC | PRN
Start: 2024-07-01 — End: 2024-07-01
  Administered 2024-07-01: 3 [drp] via TOPICAL

## 2024-07-01 MED ORDER — MIDAZOLAM HCL 1 MG/ML IJ SOLN *I* WRAPPED
INTRAMUSCULAR | Status: DC | PRN
Start: 2024-07-01 — End: 2024-07-01
  Administered 2024-07-01: 1 mg via INTRAVENOUS

## 2024-07-01 MED ORDER — EPINEPHRINE PF/SF 1 MG/ML IJ SOLN *I*
INTRAMUSCULAR | Status: DC | PRN
Start: 2024-07-01 — End: 2024-07-01
  Administered 2024-07-01: .15 mg via INTRAOCULAR

## 2024-07-01 MED ORDER — LIDOCAINE HCL 2 % UROJET *I*
CUTANEOUS | Status: DC | PRN
Start: 2024-07-01 — End: 2024-07-01
  Administered 2024-07-01: 2 mL via TOPICAL

## 2024-07-01 MED ORDER — LACTATED RINGERS IV SOLN *I*
20.0000 mL/h | INTRAVENOUS | Status: DC
Start: 2024-07-01 — End: 2024-07-02

## 2024-07-01 MED ORDER — MOXIFLOXACIN 0.5 MG/0.1 ML INTRACAMERAL INJECTION *I*
INTRACAMERAL | Status: DC | PRN
Start: 2024-07-01 — End: 2024-07-01
  Administered 2024-07-01: .5 mg via INTRACAMERAL

## 2024-07-01 MED ORDER — ACETAMINOPHEN 325 MG PO TABS *I*
ORAL_TABLET | ORAL | Status: AC
Start: 2024-07-01 — End: 2024-07-01
  Filled 2024-07-01: qty 3

## 2024-07-01 MED ORDER — PHENYLEPHRINE HCL 10 % OP SOLN *I*
1.0000 [drp] | OPHTHALMIC | Status: DC | PRN
Start: 2024-07-01 — End: 2024-07-02
  Administered 2024-07-01 (×3): 1 [drp] via OPHTHALMIC

## 2024-07-01 MED ORDER — LIDOCAINE HCL 1% IJ SOLN *WRAPPED* (FOR LINE PLACEMENT ONLY)
0.1000 mL | INTRAMUSCULAR | Status: DC | PRN
Start: 2024-07-01 — End: 2024-08-30

## 2024-07-01 MED ORDER — EPINEPHRINE 1 MG/ML IJ SOLUTION WRAPPED *I*
INTRAOCULAR | Status: DC | PRN
Start: 2024-07-01 — End: 2024-07-01
  Administered 2024-07-01: 500 mL

## 2024-07-01 MED ORDER — ACETAMINOPHEN 325 MG PO TABS *I*
975.0000 mg | ORAL_TABLET | Freq: Once | ORAL | Status: DC
Start: 2024-07-01 — End: 2024-07-01

## 2024-07-01 MED ORDER — LIDOCAINE HCL 2 % IJ SOLN *I*
INTRAMUSCULAR | Status: DC | PRN
Start: 2024-07-01 — End: 2024-07-01
  Administered 2024-07-01: 40 mg via INTRAVENOUS

## 2024-07-01 MED ORDER — MOXIFLOXACIN HCL 0.5 % OP SOLN *I*
OPHTHALMIC | 0 refills | Status: DC
Start: 2024-07-01 — End: 2024-08-14

## 2024-07-01 MED ORDER — MIDAZOLAM HCL 1 MG/ML IJ SOLN *I* WRAPPED
INTRAMUSCULAR | Status: AC
Start: 2024-07-01 — End: 2024-07-01
  Filled 2024-07-01: qty 2

## 2024-07-01 MED ORDER — ACETAMINOPHEN 160 MG/5 ML WRAPPED *I*
975.0000 mg | Freq: Once | Status: AC
Start: 2024-07-01 — End: 2024-07-01

## 2024-07-01 MED ORDER — MOXIFLOXACIN HCL 0.5 % OP SOLN *I*
OPHTHALMIC | Status: DC | PRN
Start: 2024-07-01 — End: 2024-07-01
  Administered 2024-07-01: 1 [drp] via OPHTHALMIC

## 2024-07-01 MED ORDER — LIDOCAINE HCL (PF) 1 % IJ SOLN *I*
INTRAMUSCULAR | Status: DC | PRN
Start: 2024-07-01 — End: 2024-07-01
  Administered 2024-07-01: .2 mL via INTRAOCULAR

## 2024-07-01 MED ORDER — PHENYLEPHRINE HCL 10 % OP SOLN *I*
OPHTHALMIC | Status: AC
Start: 2024-07-01 — End: 2024-07-01
  Filled 2024-07-01: qty 5

## 2024-07-01 MED ORDER — MOXIFLOXACIN HCL 0.5 % OP SOLN *I*
OPHTHALMIC | 0 refills | Status: DC
Start: 2024-07-01 — End: 2024-08-14
  Filled 2024-07-01: qty 3, 15d supply, fill #0

## 2024-07-01 MED ORDER — DEXMEDETOMIDINE HCL 200 MCG/2ML IV SOLN WRAPPED *I*
INTRAVENOUS | Status: DC | PRN
Start: 2024-07-01 — End: 2024-07-01
  Administered 2024-07-01: 4 ug via INTRAVENOUS

## 2024-07-01 MED ORDER — FENTANYL CITRATE 50 MCG/ML IJ SOLN *WRAPPED*
INTRAMUSCULAR | Status: DC | PRN
Start: 2024-07-01 — End: 2024-07-01
  Administered 2024-07-01 (×2): 25 ug via INTRAVENOUS

## 2024-07-01 MED ORDER — TETRACAINE HCL 0.5 % OP SOLN *I*
1.0000 [drp] | OPHTHALMIC | Status: DC | PRN
Start: 2024-07-01 — End: 2024-07-02
  Administered 2024-07-01 (×3): 1 [drp] via OPHTHALMIC

## 2024-07-01 SURGICAL SUPPLY — 18 items
AGENT VISCOELASTIC HEALON DUET PRO DUAL PACK (Supply) ×1 IMPLANT
BAG BSS CENTURION 500ML (Drug) ×1 IMPLANT
CARTRIDGE D MONARCH III F/IOL (Supply) ×1 IMPLANT
DRAPE NONWOVEN 65X100 OVAL (Drape) ×1 IMPLANT
DRESSING TEGADERM W/LABEL 2 3/8 X 2 3/4 (Dressing) ×1 IMPLANT
GLOVE BIOGEL PI MICRO SZ 7.5 (Glove) ×1 IMPLANT
GLOVE BIOGEL PI MICRO SZ 8 (Glove) ×1 IMPLANT
KNIFE MVR EDGE AHEAD (Supply) ×1 IMPLANT
KNIFE SAFETY SLIT XSTAR 2.2MM (Supply) ×1 IMPLANT
LENS  IOL N-PRELD +19.00D ENVISTA HYDROPHOBIC ACRYL (Implant) ×1 IMPLANT
PACK CATARACT PATIENT (Pack) ×1 IMPLANT
PACK CENTUR FMS ULTRA BAL 30DEG BEVEL UP (Pack) ×1 IMPLANT
PACK CUSTOM EYE PACK (Pack) ×1 IMPLANT
SPONGE EYE SPEAR 10PACK BLUE (Sponge) ×2 IMPLANT
SYRINGE LUERLOCK 1CC LF (Syringe) ×1 IMPLANT
SYRINGE LUERLOCK 30ML INDIVIDUAL WRAP (Syringe) IMPLANT
TIP POLYMER I/A 0.3MM (Supply) ×1 IMPLANT
WATER STERILE FOR IRRIGATION 250ML (Supply) ×1 IMPLANT

## 2024-07-01 NOTE — Discharge Instructions (Addendum)
 Patient Instructions After Cataract SurgeryName: [name]: August 21, 1956     Age: 68 y.o.MRN: [id number] Date: 8/12/2025Procedure: Cataract extraction with placement of intraocular lens, left eyeYour follow-up appointment is with Dr Oneta on 9.30 at 8/13/25Location:  [facility], [place]  Medications - Please resume all of your oral / systemic medications at this time, including any blood thinners (aspirin, warfarin (Coumadin), or Plavix), if they were held prior to surgery - Please start your postoperative eye drops (shake well): Moxifloxacin  Prednisolone  Acetate  Eye Shield Day of Surgery One Drop Four times a day One Drop Four times a day Until you get home Week 1 One Drop Four times a day One Drop Four times a day At bed time for  7 days Week 2 stop One Drop Three times a day stop Week 3 stop One Drop Twice a day stop Week 4 stop One Drop Once a day stop  - You may also take Tylenol  325 mg four times daily as needed for discomfortActivities - The recovery for vision is variable depending on the amount of swelling in your eye. It may take several days to weeks for your vision to reach its' maximum. In general, vision is reasonable and allows most of our normal activities with only minor limitations. - Avoid strenuous activity, heavy lifting or swimming for the first week after surgery. - You may drive if the vision in your other eye is sufficient for safe driving and your surgical eye is comfortable.Wound Care -  Wear patch and shield until you get home, then only wear the shield at night for 7 days.             - Please patch and shield on postoperative day 1 after the surgery and then, wear the shield only at night time on the day after surgery for the first week.             - You can shower starting on the day after surgery - avoid directly bathing the surgical eye with  water. - Use a moistened cotton ball to clean the debris from your lashes in the morning.             - Avoid bending over for the first weekDiet - As usualQuestions or Concerns - Patients usually experience some mild discomfort after surgery. If you have any discomfort after surgery, you can take 2 tablets of Tylenol  (325 mg per tablet) every four hours. - If you develop significant pain, vomiting, redness, tearing, sensitivity to light, discharge, or loss of vision in your surgical eye, please call [phone] for an urgent appointment. - After office hours, you will be connected to the answering service. Please let them know that you are a patient of Dr. Oneta and ask for the eye resident on call.Laser Surgery after Cataract SurgeryIn cataract surgery today, your natural lens is removed, leaving behind a membrane or capsule. This capsule supports the new artificial lens. Over time, the capsule may become cloudy and blur your vision. If this occurs, a laser may be used to create a pupil in the membrane. The laser is a simple procedure. However, it is only recommended if the haze develops in the capsule. You will be monitored for this occurrence after cataract surgery.Authored by Bonnita Roel Oneta, MD on 07/01/2024 at 11:02 AM About your medications from today[x]  Prescription information provided from onsite pharmacy. []  Prescription information given to patient and/or patient representative, prescription not filled at onsite pharmacy.[]  No Prescription  given.Your last pain medication was given to you at: Tylenol  at 1030amYour next dose of pain medication is due after: Tylenol  at 430pm

## 2024-07-01 NOTE — DOS Update (DOS) (Signed)
 Day of Service Surgeon/Proceduralist Documentation for [name]'sProcedure(s) (LRB):PHACO W/ IOL; +/- CAPSULE STAIN (Left)Documentation related to      History of Present Illness/Indication for Procedure      Review of Systems Specific to Indicated Procedure/Operative Site      Exam Specific to Indicated Procedure/Operative Site      Planned Procedurecan be found in document listed below or in Procedure Pass Linked Documents report. Progress Notes by Oneta Bonnita Lobos, MD on 03/26/2024  8:30 AMBy signing this note, I attest that the information documented above is correct, reflecting the current physical state of the patient today, and that it is appropriate to proceed with the planned surgery/procedure.

## 2024-07-01 NOTE — Anesthesia Postprocedure Evaluation (Signed)
 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] Surgeon(s):Celik, Bonnita Lobos, MD Responsible Anesthesia Provider:Jamiyah Dingley A, MBChB Recovery VitalsBP: 132/74 (07/01/2024 11:35 AM)Heart Rate: 75 (07/01/2024  9:54 AM)Heart Rate (via Pulse Ox): 64 (07/01/2024 11:35 AM)Resp: 16 (07/01/2024 11:35 AM)Temp: 36 C (96.8 F) (07/01/2024 11:35 AM)SpO2: 100 % (07/01/2024 11:35 AM)0-10  Pain Scale: 0 (07/01/2024 11:35 AM)Anesthesia type:MACComplications Noted During Procedure or in PACU:None Comment:  Patient Location:PACULevel of Consciousness:  Recovered to baselinePatient Participation:   Able to participateTemperature Status:  NormothermicOxygen Saturation:  Within patient's normal rangeCardiac Status: within patient's normal rangeFluid Status:  StableAirway Patency:   YesPulmonary Status:  BaselinePain Management:  Adequate analgesiaNausea and Vomiting:  Controlled  Post Op Assessment:  Tolerated procedure wellResponsible Anesthesia Provider Attestation:All indicated post anesthesia care provided -

## 2024-07-01 NOTE — Op Note (Signed)
 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], left eye Description of Procedure: Operative NotePatient: [name]: [birth year]MRN: [id number] Surgery date: 8/12/2025Surgeon: Lobos Oneta, MDFirst Assistant: Bonnita Lobos Oneta, MDAnesthesia: MAC  and topicalDiagnoses: Pre-Op Diagnosis Codes:    * Combined forms of age-related cataract of both eyes [H25.813], left eyePost-Op Diagnosis Codes:   * 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    Implant LENS  IOL N-PRELD +19.00D ENVISTA HYDROPHOBIC ACRYL - [id number] Implanted [id number]    Procedure: Prior to the day of surgery, the patient was informed of the risks, benefits, and  alternatives of cataract surgery and written informed consent was obtained.  On the day of surgery, the patient was met in the pre-operative staging area, questions were answered, and the correct operative eye, being the left  eye, was marked.   The eye was instilled with a ribbon of lidocaine  jelly in the superior and inferior fornices prior to transfer to the operating room.The patient was then taken to the operating suite and placed in the supine position.  Cardiac monitors were placed.  A time out was called and all members of the operating team confirmed that the left eye was the correct operative eye, and that cataract surgery was the correct procedure.    The patient was prepped and draped in a sterile ophthalmic fashion.  A lid speculum was placed in the eye.A 1 mm paracentesis incision was made in the cornea and non-preserved lidocaine  was injected into the anterior chamber.    Dispersive viscoelastic was injected into the anterior chamber to expand it.  A 2.2 mm keratome incision was made temporally at the limbus.    A continuous curvilinear capsulorhexis was made with the cystotome and micro-Utrada forceps.  Hydrodissection was performed with BSS on a straight cannula and the lens was noted to freely rotate.  Next the phacoemulsification handpiece was inserted into the eye and the lens was removed with a stop and chop technique.  Irrigation and aspiration was used to remove residual cortex.  The capsular bag was filled with cohesive viscoelastic and the above lens was injected into the posterior capsular bag.  The trailing haptic was placed under the anterior capsule.    The I/A hand piece was used to remove residual viscoelastic. 0.1 ml of moxifloxacin  was injected into the anterior  chamber. The wounds were hydrated and found to be water tight.  The intraocular pressure of the eye was checked with palpation and found to be normal.  The lid speculum was removed and the 1 drops of Vigamox   and 1 drop of pred forte  were placed on the ocular surface.  The eye was cleaned and a clear plastic shield was placed over the eye.  The patient was brought  to the recovery room in good condition.  The patient was given postoperative instructions and will follow up with Dr. Oneta at the [facility] tomorrow morning. Authored by Bonnita Roel Oneta, MD on 07/01/2024 at 11:03 AM Signed:  Bonnita Roel Oneta, MD  on 07/01/2024 at 11:02 AMI, Bonnita Roel Mickel Schreur performed the entire procedure.

## 2024-07-01 NOTE — Anesthesia Case Conclusion (Signed)
 CASE CONCLUSION  Emergence  Criteria Used for Airway Removal:  Adequate Tv & RR and acceptable O2 saturation  Assessment:  Routine  Transport  Directly to: PACU  Position:  Recumbent  Patient Condition on Handoff  Level of Consciousness:  Alert/talking/calm  Patient Condition:  Stable  Handoff Report to:  RN

## 2024-07-01 NOTE — Anesthesia Procedure Notes (Signed)
---------------------------------------------------------------------------------------------------------------------------------------  AIRWAY GENERAL INFORMATION AND STAFF  Patient location during procedure: OR     Date of Procedure: 07/01/2024 10:50 AMCONDITION PRIOR TO MANIPULATION   Current Airway/Neck Condition:  Normal      For more airway physical exam details, see Anesthesia PreOp EvaluationAIRWAY METHOD   Patient Position:  Sniffing  Preoxygenated: no    Maintained In-Line Stability: not needed, normal c-spine condition        To see details of medications used, see MAR  Induction: Not needed  Mask Difficulty Assessment:  0 - not attemptedFINAL AIRWAY DETAILS  Final Airway Type:  Nasal cannula  Head position required to avoid obstruction:  Neutral  Insertion Site:  Left naris and right naris----------------------------------------------------------------------------------------------------------------------------------------

## 2024-07-01 NOTE — Progress Notes (Signed)
 " Cardiology Office Note:  .   Date:  07/07/2024  ID:  Ariel Simpson, DOB 1956/01/31, MRN [id number] PCP: Geofm Glade PARAS, MD  [facility] Providers Cardiologist:  Soyla DELENA Merck, MD Electrophysiologist:  Danelle Birmingham, MD    History of Present Illness: .   Ariel Simpson is a 68 y.o. female.  Discussed the use of AI scribe software for clinical note transcription with the patient, who gave verbal consent to proceed.  History of Present Illness Ariel Simpson is a 68 year old female with mitral valve regurgitation s/p Mvrepair and now recurrent MR and hypertension who presents for follow-up of her cardiac condition.  She has moderate-severe mitral valve regurgitation, identified on a transesophageal echocardiogram after mitral valve repair with annuloplasty. A small defect in the oversewn left atrial appendage was noted, suspected to be bidirectional and less than three millimeters in size.  She remains active, recently completing a thousand-mile road trip and kayaking ten miles. She practices yoga and gardening without issues related to position or postural changes. She takes amlodipine  5 mg daily, losartan  50 mg twice daily, and metoprolol  succinate 25 mg twice daily for blood pressure control.  She experiences no dizziness or adverse symptoms during low blood pressure episodes, even with readings as low as 88/69 mmHg. She recalls a past low-pressure event while sitting in a car but did not check her blood pressure at that time. She is mindful of her hydration.    ROS: negative except per HPI above.  Studies Reviewed: .        Results DIAGNOSTIC Transesophageal Echocardiogram (TEE): Severe mitral regurgitation at normal blood pressures; moderate mitral regurgitation with lower blood pressures under anesthesia; small defect in left atrial appendage with suspected bidirectional flow, defect size less than 3 mm (12/2023)  Transthoracic Echocardiogram (TTE): Moderate to  severe mitral regurgitation; left atrium moderately to severely dilated Risk Assessment/Calculations:       Physical Exam:   VS:  BP 120/88 (BP Location: Left Arm, Patient Position: Sitting, Cuff Size: Normal)   Pulse 78   Ht 5' 6 (1.676 m)   Wt 170 lb 9.6 oz (77.4 kg)   SpO2 98%   BMI 27.54 kg/m    Wt Readings from Last 3 Encounters:  07/07/24 170 lb 9.6 oz (77.4 kg)  02/19/24 165 lb (74.8 kg)  02/14/24 166 lb (75.3 kg)     Physical Exam GENERAL: Alert, cooperative, well developed, no acute distress. HEENT: Normocephalic, normal oropharynx, moist mucous membranes. CHEST: Clear to auscultation bilaterally, no wheezes, rhonchi, or crackles. CARDIOVASCULAR: 2-3/6 HSM at left sternal border, normal heart rate and rhythm, S1 and S2 normal. ABDOMEN: Soft, non-tender, non-distended, without organomegaly, normal bowel sounds. EXTREMITIES: Varicose veins noted, minimal swelling, no cyanosis or significant edema. NEUROLOGICAL: Cranial nerves grossly intact, moves all extremities without gross motor or sensory deficit.   ASSESSMENT AND PLAN: .    Assessment and Plan Assessment & Plan Mitral valve regurgitation after mitral valve repair with left atrial enlargement Moderate to severe mitral valve regurgitation with potential underestimation due to eccentricity. Left atrium moderately to severely dilated. Condition stable, asymptomatic. Blood pressure management crucial to delay intervention. - Order echocardiogram in 6 months to monitor mitral valve regurgitation and left atrial enlargement per patient preference.  - Discuss potential need for intervention if condition worsens.  Hypertension Hypertension managed with amlodipine , losartan , and metoprolol . Occasional low blood pressure without symptoms. Target blood pressure 120s/130s over 70s/80s to prevent mitral regurgitation exacerbation. -  Continue amlodipine  5 mg daily, losartan  50 mg twice daily, and metoprolol  succinate 25 mg  twice daily. - Monitor blood pressure regularly, especially on days with low readings. - Increase fluid intake on days with low blood pressure. - Avoid strenuous activities on days with low blood pressure. - Consider reducing amlodipine  if low blood pressure episodes become frequent.  Pacemaker - stable on most recent interrogation.     Soyla Merck, MD, [facility] "

## 2024-07-02 ENCOUNTER — Other Ambulatory Visit: Payer: Self-pay

## 2024-07-02 ENCOUNTER — Encounter: Payer: Self-pay | Admitting: Ophthalmology

## 2024-07-02 ENCOUNTER — Ambulatory Visit: Attending: Ophthalmology | Admitting: Ophthalmology

## 2024-07-02 DIAGNOSIS — H02831 Dermatochalasis of right upper eyelid: Secondary | ICD-10-CM | POA: Insufficient documentation

## 2024-07-02 DIAGNOSIS — H25811 Combined forms of age-related cataract, right eye: Secondary | ICD-10-CM | POA: Insufficient documentation

## 2024-07-02 DIAGNOSIS — H02834 Dermatochalasis of left upper eyelid: Secondary | ICD-10-CM | POA: Insufficient documentation

## 2024-07-02 DIAGNOSIS — H52223 Regular astigmatism, bilateral: Secondary | ICD-10-CM | POA: Insufficient documentation

## 2024-07-02 DIAGNOSIS — D3131 Benign neoplasm of right choroid: Secondary | ICD-10-CM | POA: Insufficient documentation

## 2024-07-02 DIAGNOSIS — Z9842 Cataract extraction status, left eye: Secondary | ICD-10-CM | POA: Insufficient documentation

## 2024-07-02 NOTE — Progress Notes (Signed)
 Outpatient VisitPatient name: [name]: 12/03/1955       Age: 68 y.o.MR#: [id number] Encounter Date: 8/13/2025Subjective:  Chief Complaint Patient presents with  Post-op   S/p Phaco w/IOL (left) 07/01/2024 HPI   Post-op   Additional comments: S/p Phaco w/IOL (left) 07/01/2024   Comments  [name] is a 68 y.o. female presenting for 1 day S/P PHACO w/ IOL left eye. (07/01/2024). Patient slept well and denies pain. She states she has a headache, made worse by bright light. She took some Tylenol . Ocular medications: Prednisolone  4 times a day left eye Vigamox  4 times a day left eye     Last edited by Perry Ip, COA on 07/02/2024  9:47 AM.  has a current medication list which includes the following prescription(s): moxifloxacin , prednisolone  acetate, prednisolone  acetate, moxifloxacin , simethicone, carbamazepine , ketotifen, acetaminophen , polyethylene glycol, l-glutamine, zinc sulfate, NONFORMULARY, OTHER, ORDER *I*, NONFORMULARY, OTHER, ORDER *I*, methylfolate, NONFORMULARY, OTHER, ORDER *I*, carbamazepine , levothyroxine , mirabegron , rabeprazole , azelastine , generic dme, generic dme, fluticasone -salmeterol, atorvastatin , amlodipine , triamcinolone , solifenacin , fluticasone , econazole nitrate , generic dme, guaifenesin, generic dme, metronidazole, famotidine , pseudoephedrine , [facility] 12 hour nasal decongestant, non-system medication, calcium  carbonate-cholecalciferol , denosumab , loratadine, multivitamin, and cholecalciferol , and the following Facility-Administered Medications: lidocaine  hcl. is allergic to bactrim, dilantin [phenytoin], penicillins, seasonal allergies, and sulfa antibiotics.  Past Medical History[1] Past Surgical History[2] Specialty Problems    Ophthalmology Problems  S/P CE IOL OS 07/01/24 IOL +19.00D ENVISTA    ROS  Positive for: EyesNegative for: Constitutional,  Gastrointestinal, Neurological, Skin, Genitourinary, Musculoskeletal, HENT, Endocrine, Cardiovascular, Respiratory, Psychiatric, Allergic/Imm, Heme/LymphLast edited by Perry Ip, COA on 06/30/2024 11:38 AM.   Objective: Base Eye Exam   Visual Acuity (Snellen - Linear)     Right Left  Dist sc  20/60  Dist ph sc  20/40 -2 Patient states vision is very blurry today (left)   Tonometry (Tonopen, 9:53 AM)     Right Left  Pressure 10 10    Pupils     Dark Light Shape React APD  Right 5 3 Round Brisk None  Left 5 3 Round Brisk None    Neuro/Psych   Oriented x3: Yes  Mood/Affect: Normal    Slit Lamp and Fundus Exam   External Exam     Right Left  External Normal ocular adnexae, lacrimal gland & drainage, orbits Normal ocular adnexae, lacrimal gland & drainage, orbits    Slit Lamp Exam     Right Left  Lids/Lashes 3+ Dermatochalasis - upper lid 2+ Dermatochalasis - upper lid  Conjunctiva/Sclera Normal bulbar/palpebral, conjunctiva, sclera Normal bulbar/palpebral, conjunctiva, sclera  Cornea Normal epithelium, stroma, endothelium, tear film; two small SMI S resolving w/o epithelial defect or NaFl uptake Normal epithelium, stroma, endothelium, tear film  Anterior Chamber Clear & deep Clear & deep  Iris Normal shape, size, morphology Normal shape, size, morphology  Lens NS3/early CS spoking in periphery Posterior chamber intraocular lens  Anterior Vitreous Clear Clear        Assessment/Plan: 1. Status post left cataract extraction    2. Combined forms of age-related cataract of right eye    3. Choroidal nevus of right eye    4. Dermatochalasis of both upper eyelids    5. Regular astigmatism, bilateral     Assessment/Plan1 day s/p cataract extraction/intraocular lens, OS- Doing well- Moxifloxacin  1 drop four times daily for 1 week-  Prednisolone  acetate 1% 1 drop four times daily- Wear eye shield at bedtime for 2 more nights, discussed limited bending/stooping/lifting objects greater than 10 lbs, careful with exposure  to water in the eye- Call ASAP for decreasing vision, flashing lights, floaters, or any other concerning symptoms.-Visually significant cataract right eye affecting activities of daily living-Visually significant cataract causing a limitation in activities of daily living such as driving, reading and adequately performing hobbies.  -We will plan on using local, topical anesthesia in conjunction with monitored anesthesia care (MAC). There is a slight possibility that General Anesthesia may be necessary. -The patient has elected to proceed, right eye- Post-surgical refractive target was aimed as discussed before.-Discussed R/B/A CE/PICOL including the risks of endophthalmitis, retinal detachment, choroidal hemorrhage, anterior and posterior capsule tears, retained lens fragment, lens placement in sulcus or anterior chamber, vitreous loss, anterior vitrectomy, corneal decompensation, ptosis, CME, PCO requiring Nd:YAG capsulotomy, need for glasses after surgery, need for further surgery, and risks of anesthesia and patient the wishes to proceed with surgery.Plan:-Cancel 8/20 appointment with me,-Keep surgery plan with mePatient was seen and examined.  Findings, assessment, and plan were discussed in detail with the patient, who verbalized understanding of and agreement with plan. All questions were answered. The patient was instructed to call or come in if there are any new symptoms or existing symptoms persist or worsen. To call if questions or concerns: [phone], number provided. 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  AVM (arteriovenous malformation)   supratentorial complex   Contusion of foot 06/02/2013  GERD (gastroesophageal reflux disease) 2012  Limited diet & aciphex   High blood pressure 1995  Hyperlipidemia 2010  Lipitor  Hypertension   Hypothyroidism   Instability of joints of both ankles   poor musculature; needs orthotic shoes  Irritable bowel syndrome 1977  Intermittent, chronic diet controlled  Osteoporosis   Overactive bladder 05/19/2024  Rosacea 10/19/2005  Scoliosis   Seizure   Stroke   Tinnitus   Urge Incontinence Of Urine 10/19/2005 [2] Past Surgical History:Procedure Laterality Date  COLONOSCOPY  Every 10 years  CRANIOTOMY    AVM Repair  HX TONSILLECTOMY/ADENOIDECTOMY    Tonsillectomy Conversion Data   TONSILLECTOMY    WRIST FRACTURE SURGERY  2007,2010  Reconstruction, plates

## 2024-07-03 ENCOUNTER — Ambulatory Visit: Payer: Self-pay | Admitting: Internal Medicine

## 2024-07-03 ENCOUNTER — Ambulatory Visit (HOSPITAL_COMMUNITY)
Admission: RE | Admit: 2024-07-03 | Discharge: 2024-07-03 | Disposition: A | Discharge status: Home / Self Care | Source: Ambulatory Visit | Attending: Internal Medicine | Admitting: Internal Medicine

## 2024-07-03 DIAGNOSIS — I4891 Unspecified atrial fibrillation: Secondary | ICD-10-CM | POA: Diagnosis not present

## 2024-07-03 DIAGNOSIS — Z950 Presence of cardiac pacemaker: Secondary | ICD-10-CM | POA: Diagnosis not present

## 2024-07-03 DIAGNOSIS — I340 Nonrheumatic mitral (valve) insufficiency: Secondary | ICD-10-CM | POA: Diagnosis not present

## 2024-07-03 DIAGNOSIS — Z9889 Other specified postprocedural states: Secondary | ICD-10-CM | POA: Insufficient documentation

## 2024-07-03 DIAGNOSIS — I509 Heart failure, unspecified: Secondary | ICD-10-CM | POA: Insufficient documentation

## 2024-07-03 DIAGNOSIS — I083 Combined rheumatic disorders of mitral, aortic and tricuspid valves: Secondary | ICD-10-CM | POA: Diagnosis not present

## 2024-07-03 DIAGNOSIS — I110 Hypertensive heart disease with heart failure: Secondary | ICD-10-CM | POA: Diagnosis not present

## 2024-07-03 DIAGNOSIS — R600 Localized edema: Secondary | ICD-10-CM | POA: Insufficient documentation

## 2024-07-03 LAB — ECHOCARDIOGRAM COMPLETE
AR max vel: 2.55 cm2
AV Area VTI: 2.43 cm2
AV Area mean vel: 2.59 cm2
AV Mean grad: 2 mmHg
AV Peak grad: 4 mmHg
Ao pk vel: 1 m/s
Area-P 1/2: 3.03 cm2
MV VTI: 1.21 cm2
S' Lateral: 2.49 cm

## 2024-07-06 ENCOUNTER — Encounter: Payer: Self-pay | Admitting: Internal Medicine

## 2024-07-07 ENCOUNTER — Ambulatory Visit: Attending: Internal Medicine | Admitting: Internal Medicine

## 2024-07-07 ENCOUNTER — Encounter: Payer: Self-pay | Admitting: Internal Medicine

## 2024-07-07 VITALS — BP 120/88 | HR 78 | Ht 66.0 in | Wt 170.6 lb

## 2024-07-07 DIAGNOSIS — Z9889 Other specified postprocedural states: Secondary | ICD-10-CM | POA: Diagnosis not present

## 2024-07-07 DIAGNOSIS — I340 Nonrheumatic mitral (valve) insufficiency: Secondary | ICD-10-CM

## 2024-07-07 DIAGNOSIS — Z950 Presence of cardiac pacemaker: Secondary | ICD-10-CM | POA: Diagnosis not present

## 2024-07-07 DIAGNOSIS — I10 Essential (primary) hypertension: Secondary | ICD-10-CM | POA: Diagnosis not present

## 2024-07-07 NOTE — Patient Instructions (Signed)
 Medication Instructions:  No Changes *If you need a refill on your cardiac medications before your next appointment, please call your pharmacy*  Lab Work: None  Testing/Procedures: Your physician has requested that you have an echocardiogram; to be in early February 2026. Echocardiography is a painless test that uses sound waves to create images of your heart. It provides your doctor with information about the size and shape of your heart and how well your heart's chambers and valves are working. This procedure takes approximately one hour. There are no restrictions for this procedure. Please do NOT wear cologne, perfume, aftershave, or lotions (deodorant is allowed). Please arrive 15 minutes prior to your appointment time.  Please note: We ask at that you not bring children with you during ultrasound (echo/ vascular) testing. Due to room size and safety concerns, children are not allowed in the ultrasound rooms during exams. Our front office staff cannot provide observation of children in our lobby area while testing is being conducted. An adult accompanying a patient to their appointment will only be allowed in the ultrasound room at the discretion of the ultrasound technician under special circumstances. We apologize for any inconvenience.   Follow-Up: At [facility], you and your health needs are our priority.  As part of our continuing mission to provide you with exceptional heart care, our providers are all part of one team.  This team includes your primary Cardiologist (physician) and Advanced Practice Providers or APPs (Physician Assistants and Nurse Practitioners) who all work together to provide you with the care you need, when you need it.  Your next appointment:   6 month(s) (after you have the Echocardiogram)  Provider:   Gayatri A Acharya, MD or Dr. Jerel Balding    Other Instructions Please call us  or send a MyChart message with any Cardiology related  questions/concerns.  [phone].  Thank you!

## 2024-07-08 ENCOUNTER — Other Ambulatory Visit: Payer: Self-pay

## 2024-07-09 ENCOUNTER — Encounter: Admitting: Ophthalmology

## 2024-07-10 ENCOUNTER — Other Ambulatory Visit: Payer: Self-pay

## 2024-07-10 NOTE — Anesthesia Preprocedure Evaluation (Addendum)
 Anesthesia Pre-operative History and Physical for Yesenia Hazelbaker JohnstonHighlighted Issues for this Procedure:68 y.o. 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] presenting for Procedure(s) (LRB):PHACO W/ IOL; +/- CAPSULE STAIN (Right) by Surgeon(s):Celik, Bonnita Lobos, MD scheduled for 30 minutes.BMI Readings from Last 1 Encounters:07/01/24 : 19.54 kg/m.Anesthesia Evaluation Information Source: records, patient   ANESTHESIA HISTORYPertinent(-):  No History of anesthetic complications or Family hx of anesthetic complicationsGENERALPertinent (-):  No history of anesthetic complications or Family Hx of Anesthetic ComplicationsHEENT  + Hearing Loss          Right Ear: HOH          Left Ear: HOH  + TMJD PULMONARY  + Asthma        mild intermittentPertinent(-):  No smokingCARDIOVASCULARGood(4+METs) Exercise Tolerance  + HypertensionPertinent(-):  No hx of DVTGI/HEPATIC/RENAL   + GERD        well controlled  + Bowel Issues        IBSPertinent(-):  No liver  issues or renal issues  NEURO/PSYCH/ORTHO  + Chronic pain  + Seizures        well controlled  + Cerebrovascular event (ruptured AVM/ seizure 2004)Pertinent(-):  No         well controlledENDO/OTHER  + Thyroid  Disease        HYPOthyroid HEMATOLOGIC  + Blood dyscrasia        hyperlipidemia Physical ExamAirway          Mouth opening: normal          Mallampati: I          TM distance (fb): >3 FB          Neck ROM: fullDental   Normal Exam Comment: No loose or broken teeth.  Cardiovascular         Rhythm: regular         Rate: normalNo murmurNeurologic    Normal ExamGeneral Survey    Normal Exam Pulmonary   breath sounds clear to auscultation  No cough, rhonchiMental Status   Normal Exam ________________________________________________________________________PLANASA Score   2Anesthetic Plan MAC Induction (routine IV);  Airway Manipulation (none); Airway (nasal cannula); Line ( use current access); Monitoring (standard ASA); Positioning (supine); Pain (per surgical team); PostOp (PACU)Standard AttestationInformed Consent Responsible Anesthesia Provider Attestation:I attest that the patient or proxy understands and accepts the risks and benefits of the anesthesia plan. I also attest that I have personally performed a pre-anesthetic examination and evaluation, and prescribed the anesthetic plan for this particular location within 48 hours prior to the anesthetic as documented. Darrow Barreiro A Urias Sheek, MBChB  07/15/24, 10:00 AM

## 2024-07-14 ENCOUNTER — Other Ambulatory Visit: Payer: Self-pay

## 2024-07-15 ENCOUNTER — Ambulatory Visit
Admission: RE | Admit: 2024-07-15 | Discharge: 2024-07-15 | Disposition: A | Discharge status: Home / Self Care | Source: Ambulatory Visit | Attending: Ophthalmology | Admitting: Ophthalmology

## 2024-07-15 ENCOUNTER — Encounter: Payer: Self-pay | Admitting: Student in an Organized Health Care Education/Training Program

## 2024-07-15 ENCOUNTER — Encounter
Admission: RE | Disposition: A | Discharge status: Home / Self Care | Payer: Self-pay | Source: Ambulatory Visit | Attending: Ophthalmology

## 2024-07-15 ENCOUNTER — Other Ambulatory Visit: Payer: Self-pay

## 2024-07-15 ENCOUNTER — Encounter: Payer: Self-pay | Admitting: Ophthalmology

## 2024-07-15 DIAGNOSIS — R110 Nausea: Secondary | ICD-10-CM | POA: Insufficient documentation

## 2024-07-15 DIAGNOSIS — K219 Gastro-esophageal reflux disease without esophagitis: Secondary | ICD-10-CM | POA: Insufficient documentation

## 2024-07-15 DIAGNOSIS — H25813 Combined forms of age-related cataract, bilateral: Secondary | ICD-10-CM

## 2024-07-15 DIAGNOSIS — Z9842 Cataract extraction status, left eye: Secondary | ICD-10-CM | POA: Insufficient documentation

## 2024-07-15 DIAGNOSIS — I10 Essential (primary) hypertension: Secondary | ICD-10-CM | POA: Insufficient documentation

## 2024-07-15 DIAGNOSIS — H25811 Combined forms of age-related cataract, right eye: Secondary | ICD-10-CM | POA: Insufficient documentation

## 2024-07-15 DIAGNOSIS — J4520 Mild intermittent asthma, uncomplicated: Secondary | ICD-10-CM | POA: Insufficient documentation

## 2024-07-15 DIAGNOSIS — Z961 Presence of intraocular lens: Secondary | ICD-10-CM | POA: Insufficient documentation

## 2024-07-15 HISTORY — PX: PR XCAPSL CTRC RMVL INSJ IO LENS PROSTH W/O ECP: 66984

## 2024-07-15 SURGERY — PHACOEMULSIFICATION, CATARACT, EXTRACAPSULAR
Anesthesia: Monitor Anesthesia Care | Site: Eye | Laterality: Right | Wound class: Clean

## 2024-07-15 MED ORDER — TROPICAMIDE 1 % OP SOLN *I*
1.0000 [drp] | OPHTHALMIC | Status: DC | PRN
Start: 2024-07-15 — End: 2024-07-16
  Administered 2024-07-15 (×3): 1 [drp] via OPHTHALMIC

## 2024-07-15 MED ORDER — ACETAMINOPHEN 325 MG PO TABS *I*
975.0000 mg | ORAL_TABLET | Freq: Once | ORAL | Status: AC
Start: 2024-07-15 — End: 2024-07-15
  Administered 2024-07-15: 975 mg via ORAL

## 2024-07-15 MED ORDER — MIDAZOLAM HCL 1 MG/ML IJ SOLN *I* WRAPPED
INTRAMUSCULAR | Status: AC
Start: 2024-07-15 — End: 2024-07-15
  Filled 2024-07-15: qty 2

## 2024-07-15 MED ORDER — LIDOCAINE HCL 2 % UROJET *I*
CUTANEOUS | Status: DC | PRN
Start: 2024-07-15 — End: 2024-07-15
  Administered 2024-07-15: .05 mL via TOPICAL

## 2024-07-15 MED ORDER — PREDNISOLONE ACETATE 1 % OP SUSP *I*
OPHTHALMIC | 0 refills | Status: DC
Start: 2024-07-15 — End: 2024-07-22
  Filled 2024-07-15: qty 5, 30d supply, fill #0

## 2024-07-15 MED ORDER — PREDNISOLONE ACETATE 1 % OP SUSP *I*
OPHTHALMIC | Status: DC | PRN
Start: 2024-07-15 — End: 2024-07-15
  Administered 2024-07-15: 1 [drp] via OPHTHALMIC

## 2024-07-15 MED ORDER — PHENYLEPHRINE HCL 10 % OP SOLN *I*
1.0000 [drp] | OPHTHALMIC | Status: DC | PRN
Start: 2024-07-15 — End: 2024-07-16
  Administered 2024-07-15 (×3): 1 [drp] via OPHTHALMIC

## 2024-07-15 MED ORDER — MOXIFLOXACIN HCL 0.5 % OP SOLN *I*
OPHTHALMIC | 0 refills | Status: DC
Start: 2024-07-15 — End: 2024-08-14
  Filled 2024-07-15: qty 3, 30d supply, fill #0

## 2024-07-15 MED ORDER — PHENYLEPHRINE HCL 10 % OP SOLN *I*
OPHTHALMIC | Status: AC
Start: 2024-07-15 — End: 2024-07-15
  Filled 2024-07-15: qty 5

## 2024-07-15 MED ORDER — MOXIFLOXACIN 0.5 MG/0.1 ML INTRACAMERAL INJECTION *I*
INTRACAMERAL | Status: DC | PRN
Start: 2024-07-15 — End: 2024-07-15
  Administered 2024-07-15: .5 mg via INTRACAMERAL

## 2024-07-15 MED ORDER — TETRACAINE HCL 0.5 % OP SOLN *I*
1.0000 [drp] | OPHTHALMIC | Status: DC | PRN
Start: 2024-07-15 — End: 2024-07-16
  Administered 2024-07-15 (×3): 1 [drp] via OPHTHALMIC

## 2024-07-15 MED ORDER — LACTATED RINGERS IV SOLN *I*
20.0000 mL/h | INTRAVENOUS | Status: DC
Start: 2024-07-15 — End: 2024-07-16

## 2024-07-15 MED ORDER — FENTANYL CITRATE 50 MCG/ML IJ SOLN *WRAPPED*
INTRAMUSCULAR | Status: DC | PRN
Start: 2024-07-15 — End: 2024-07-15
  Administered 2024-07-15: 50 ug via INTRAVENOUS

## 2024-07-15 MED ORDER — HALOPERIDOL LACTATE 5 MG/ML IJ SOLN *I*
INTRAMUSCULAR | Status: AC
Start: 2024-07-15 — End: 2024-07-15
  Filled 2024-07-15: qty 1

## 2024-07-15 MED ORDER — SODIUM CHLORIDE 0.9 % INJ (FLUSH) WRAPPED (FOR OSM ONLY) *I*
Status: DC | PRN
Start: 2024-07-15 — End: 2024-07-15
  Administered 2024-07-15: 10 mL via INTRAVENOUS

## 2024-07-15 MED ORDER — TETRACAINE HCL 0.5 % OP SOLN *I*
OPHTHALMIC | Status: AC
Start: 2024-07-15 — End: 2024-07-15
  Filled 2024-07-15: qty 1

## 2024-07-15 MED ORDER — FENTANYL CITRATE 50 MCG/ML IJ SOLN *WRAPPED*
INTRAMUSCULAR | Status: AC
Start: 2024-07-15 — End: 2024-07-15
  Filled 2024-07-15: qty 2

## 2024-07-15 MED ORDER — LIDOCAINE HCL (PF) 1 % IJ SOLN *I*
INTRAMUSCULAR | Status: DC | PRN
Start: 2024-07-15 — End: 2024-07-15
  Administered 2024-07-15: .1 mL via INTRAOCULAR

## 2024-07-15 MED ORDER — ACETAMINOPHEN 325 MG PO TABS *I*
ORAL_TABLET | ORAL | Status: AC
Start: 2024-07-15 — End: 2024-07-15
  Filled 2024-07-15: qty 3

## 2024-07-15 MED ORDER — POVIDONE-IODINE 5 % OP SOLN *I*
OPHTHALMIC | Status: DC | PRN
Start: 2024-07-15 — End: 2024-07-15
  Administered 2024-07-15: 30 mL via TOPICAL

## 2024-07-15 MED ORDER — EPINEPHRINE 1 MG/ML IJ SOLUTION WRAPPED *I*
INTRAOCULAR | Status: DC | PRN
Start: 2024-07-15 — End: 2024-07-15
  Administered 2024-07-15: 500 mL via INTRAOCULAR

## 2024-07-15 MED ORDER — HALOPERIDOL LACTATE 5 MG/ML IJ SOLN *I*
1.0000 mg | Freq: Once | INTRAMUSCULAR | Status: AC | PRN
Start: 2024-07-15 — End: 2024-07-15

## 2024-07-15 MED ORDER — TROPICAMIDE 1 % OP SOLN *I*
OPHTHALMIC | Status: AC
Start: 2024-07-15 — End: 2024-07-15
  Filled 2024-07-15: qty 3

## 2024-07-15 MED ORDER — MOXIFLOXACIN HCL 0.5 % OP SOLN *I*
OPHTHALMIC | Status: DC | PRN
Start: 2024-07-15 — End: 2024-07-15
  Administered 2024-07-15: 1 [drp] via TOPICAL

## 2024-07-15 MED ORDER — LIDOCAINE HCL 1% IJ SOLN *WRAPPED* (FOR LINE PLACEMENT ONLY)
0.1000 mL | INTRAMUSCULAR | Status: DC | PRN
Start: 2024-07-15 — End: 2024-09-13

## 2024-07-15 MED ORDER — ONDANSETRON HCL 2 MG/ML IV SOLN *I*
INTRAMUSCULAR | Status: AC
Start: 2024-07-15 — End: 2024-07-15
  Filled 2024-07-15: qty 2

## 2024-07-15 MED ORDER — ONDANSETRON HCL 2 MG/ML IV SOLN *I*
1.0000 mg | Freq: Once | INTRAMUSCULAR | Status: AC | PRN
Start: 2024-07-15 — End: 2024-07-15
  Administered 2024-07-15: 1 mg via INTRAVENOUS

## 2024-07-15 MED ORDER — EPINEPHRINE PF/SF 1 MG/ML IJ SOLN *I*
INTRAMUSCULAR | Status: DC | PRN
Start: 2024-07-15 — End: 2024-07-15
  Administered 2024-07-15: .05 mL via INTRAOCULAR

## 2024-07-15 MED ORDER — BSS IO SOLN *I*
INTRAOCULAR | Status: DC | PRN
Start: 2024-07-15 — End: 2024-07-15
  Administered 2024-07-15: 15 mL via OPHTHALMIC

## 2024-07-15 SURGICAL SUPPLY — 18 items
AGENT VISCOELASTIC HEALON DUET PRO DUAL PACK (Supply) ×1 IMPLANT
BAG BSS CENTURION 500ML (Drug) ×1 IMPLANT
CARTRIDGE D MONARCH III F/IOL (Supply) ×1 IMPLANT
DRAPE NONWOVEN 65X100 OVAL (Drape) ×1 IMPLANT
DRESSING TEGADERM W/LABEL 2 3/8 X 2 3/4 (Dressing) ×1 IMPLANT
GLOVE BIOGEL PI MICRO SZ 7.5 (Glove) ×1 IMPLANT
GLOVE BIOGEL PI MICRO SZ 8 (Glove) ×1 IMPLANT
KNIFE MVR EDGE AHEAD (Supply) ×1 IMPLANT
KNIFE SAFETY SLIT XSTAR 2.2MM (Supply) ×1 IMPLANT
LENS  IOL N-PRELD +19.00D ENVISTA HYDROPHOBIC ACRYL (Implant) ×1 IMPLANT
PACK CATARACT PATIENT (Pack) ×1 IMPLANT
PACK CENTUR FMS ULTRA BAL 30DEG BEVEL UP (Pack) ×1 IMPLANT
PACK CUSTOM EYE PACK (Pack) ×1 IMPLANT
SPONGE EYE SPEAR 10PACK BLUE (Sponge) ×2 IMPLANT
SYRINGE LUERLOCK 1CC LF (Syringe) ×1 IMPLANT
SYRINGE LUERLOCK 30ML INDIVIDUAL WRAP (Syringe) IMPLANT
TIP POLYMER I/A 0.3MM (Supply) ×1 IMPLANT
WATER STERILE FOR IRRIGATION 250ML (Supply) ×1 IMPLANT

## 2024-07-15 NOTE — Anesthesia Postprocedure Evaluation (Signed)
 Anesthesia Post-Op NotePatient: Yesenia Demars JohnstonProcedure(s) Performed:Procedure SummaryDate:07/15/2024 Anesthesia Start: 07/15/2024 10:40 AM Anesthesia Stop: 07/15/2024 11:03 AM Room / Location:[id number] / [facility] Procedure(s):PHACO W/ IOL; +/- CAPSULE STAIN Diagnosis:Combined forms of age-related cataract of both eyes [H25.813] Surgeon(s):Celik, Bonnita Lobos, MDJoshi, Manasi, MD Responsible Anesthesia Provider:Shaman Muscarella A, MBChB Recovery VitalsBP: 122/68 (07/15/2024 11:31 AM)Heart Rate: 69 (07/15/2024 11:31 AM)Heart Rate (via Pulse Ox): 69 (07/15/2024 11:31 AM)Resp: 15 (07/15/2024 11:31 AM)Temp: 36 C (96.8 F) (07/15/2024 11:01 AM)SpO2: 97 % (07/15/2024 11:31 AM)0-10  Pain Scale: 2 (07/15/2024 11:31 AM)Anesthesia type:MACComplications Noted During Procedure or in PACU:None Comment:  Patient Location:PACULevel of Consciousness:  Recovered to baselinePatient Participation:   Able to participateTemperature Status:  NormothermicOxygen Saturation:  Within patient's normal rangeCardiac Status: within patient's normal rangeFluid Status:  StableAirway Patency:   YesPulmonary Status:  BaselinePain Management:  Adequate analgesiaNausea and Vomiting:  PONV: She felt episodes of nausea, zoffran wass given in PACU.  Post Op Assessment:  Tolerated procedure wellResponsible Anesthesia Provider Attestation:All indicated post anesthesia care provided -

## 2024-07-15 NOTE — Op Note (Addendum)
 Operative NotePatient: [name]: 20-Dec-1957MRN: [id number] Surgery date: 8/26/2025Surgeon: Roel Crouch, MDFirst Assistant: Caroll Legions, MDAnesthesia: MAC  and topicalDiagnoses: Pre-Op Diagnosis Codes:    * Combined forms of age-related cataract of both eyes [H25.813], right eyePost-Op Diagnosis Codes:   * Combined forms of age-related cataract of both eyes [H25.813], right eye Procedure:   Phacoemulsification, extracapsular cataract extraction, implanatation of intraocular lens, right eye     Complications: NoneIndications for the procedure: Visually significant cataract affecting activities of daily living right eye Implants   Type Name Action Serial No.    Implant LENS  IOL N-PRELD +19.00D ENVISTA HYDROPHOBIC ACRYL - [id number] Implanted [id number]    Procedure: Prior to the day of surgery, the patient was informed of the risks, benefits, and alternatives of cataract surgery and written informed consent was obtained.  On the day of surgery, the patient was met in the pre-operative staging area, questions were answered, and the correct operative eye, being the right eye, was marked.   The eye was instilled with a ribbon of lidocaine  jelly in the superior and inferior fornices prior to transfer to the operating room.The patient was then taken to the operating suite and placed in the supine position.  Cardiac monitors were placed.  A time out was called and all members of the operating team confirmed that the right eye was the correct operative eye, and that cataract surgery was the correct procedure.    The patient was prepped and draped in a sterile ophthalmic fashion.  A lid speculum was placed in the eye.A 1 mm paracentesis incision was made in the cornea and non-preserved lidocaine  was injected into the anterior chamber.    Dispersive viscoelastic was injected into the anterior chamber to expand it.  A 2.2 mm keratome  incision was made temporally at the limbus.    A continuous curvilinear capsulorhexis was made with the cystotome and micro-Utrada forceps.  Hydrodissection was performed with BSS on a straight cannula and the lens was noted to freely rotate.  Next the phacoemulsification handpiece was inserted into the eye and the lens was removed with a chop technique.  Irrigation and aspiration was used to remove residual cortex.  The capsular bag was filled with cohesive viscoelastic and the above lens was injected into the posterior capsular bag.  The trailing haptic was placed under the anterior capsule.    The I/A hand piece was used to remove residual viscoelastic. 0.1 ml of moxifloxacin  was injected into the anterior chamber. The wounds were hydrated and found to be water tight.  The intraocular pressure of the eye was checked with palpation and found to be normal.  The lid speculum was removed and the 1 drops of Vigamox   and 1 drop of pred forte  were placed on the ocular surface.  The eye was cleaned and a clear plastic shield was placed over the eye.  The patient was brought  to the recovery room in good condition.  The patient was given postoperative instructions and will follow up with Dr. Crouch at the [facility] tomorrow morning. Authored by Caroll Legions, MD on 07/15/2024 at 11:00 AMI, [facility] Ugur Keyah Blizard performed the entire procedure with assistance from the resident/fellow. I agree with the resident / fellow's operative report, findings, and plan of care as documented above.

## 2024-07-15 NOTE — Anesthesia Procedure Notes (Signed)
---------------------------------------------------------------------------------------------------------------------------------------  AIRWAY GENERAL INFORMATION AND STAFF  Patient location during procedure: OR     Date of Procedure: 07/15/2024 10:47 AMCONDITION PRIOR TO MANIPULATION   Current Airway/Neck Condition:  Normal      For more airway physical exam details, see Anesthesia PreOp EvaluationFINAL AIRWAY DETAILS  Final Airway Type:  Nasal cannula----------------------------------------------------------------------------------------------------------------------------------------

## 2024-07-15 NOTE — Anesthesia Case Conclusion (Signed)
 CASE CONCLUSION  Emergence  Criteria Used for Airway Removal:  Adequate Tv & RR, acceptable O2 saturation and following commands  Assessment:  Routine  Transport  Directly to: PACU  Position:  Upright  Patient Condition on Handoff  Level of Consciousness:  Alert/talking/calm  Patient Condition:  Stable  Handoff Report to:  RN

## 2024-07-15 NOTE — DOS Update (DOS) (Signed)
 Day of Service Surgeon/Proceduralist Documentation for [name]'sProcedure(s) (LRB):PHACO W/ IOL; +/- CAPSULE STAIN (Right)Documentation related to      History of Present Illness/Indication for Procedure      Review of Systems Specific to Indicated Procedure/Operative Site      Exam Specific to Indicated Procedure/Operative Site      Planned Procedurecan be found in document listed below or in Procedure Pass Linked Documents report. Progress Notes by Oneta Bonnita Lobos, MD on 07/02/2024  9:30 AMBy signing this note, I attest that the information documented above is correct, reflecting the current physical state of the patient today, and that it is appropriate to proceed with the planned surgery/procedure.

## 2024-07-15 NOTE — Progress Notes (Unsigned)
 Outpatient VisitPatient name: [name]: Aug 26, 1956       Age: 68 y.o.MR#: [id number] Encounter Date: 8/27/2025Subjective:  No chief complaint on file.has a current medication list which includes the following prescription(s): moxifloxacin , prednisolone  acetate, moxifloxacin , prednisolone  acetate, prednisolone  acetate, moxifloxacin , simethicone, carbamazepine , ketotifen, acetaminophen , polyethylene glycol, l-glutamine, zinc sulfate, NONFORMULARY, OTHER, ORDER *I*, NONFORMULARY, OTHER, ORDER *I*, methylfolate, NONFORMULARY, OTHER, ORDER *I*, carbamazepine , levothyroxine , mirabegron , rabeprazole , azelastine , generic dme, generic dme, fluticasone -salmeterol, atorvastatin , amlodipine , triamcinolone , solifenacin , fluticasone , econazole nitrate , generic dme, guaifenesin, generic dme, metronidazole, famotidine , pseudoephedrine , [facility] 12 hour nasal decongestant, non-system medication, calcium  carbonate-cholecalciferol , denosumab , loratadine, multivitamin, and cholecalciferol , and the following Facility-Administered Medications: phenylephrine , tropicamide , tetracaine , lactated ringers , lidocaine  hcl, and haloperidol  lactate. is allergic to bactrim, dilantin [phenytoin], penicillins, seasonal allergies, and sulfa antibiotics.  Past Medical History[1] Past Surgical History[2] Specialty Problems    Ophthalmology Problems  S/P CE IOL OS 07/01/24 IOL +19.00D ENVISTA    S/P CE IOL OD  07/15/24 ENVISTA 19D     Objective: Not recorded   No annotated images are attached to the encounter.  Assessment/Plan: 1. S/P CE IOL OD  07/15/24 ENVISTA 19D    2. S/P CE IOL OS 07/01/24 EE 19D    3. Regular astigmatism, bilateral    4. Marginal keratitis     Assessment/Plan1 day s/p cataract extraction/intraocular lens, OD- Doing well- Moxifloxacin  1 drop four times daily for 1 week- Prednisolone  acetate 1% 1 drop four times  daily- Wear eye shield at bedtime for 2 more nights, discussed limited bending/stooping/lifting objects greater than 10 lbs, careful with exposure to water in the eye- Call ASAP for decreasing vision, flashing lights, floaters, or any other concerning symptoms.2 week s/p cataract extraction/intraocular lens, OS - Doing well- Stop Moxifloxacin - Taper prednisolone  acetate:  - Twice daily x 1 week - Once daily x 1 week, then stopPlan:Keep follow up 9/5 with Dr RyanPatient was seen and examined.  Findings, assessment, and plan were discussed in detail with the patient, who verbalized understanding of and agreement with plan. All questions were answered. The patient was instructed to call or come in if there are any new symptoms or existing symptoms persist or worsen. To call if questions or concerns: [phone], number provided. 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  AVM (arteriovenous malformation)   supratentorial complex  Contusion of foot 06/02/2013  GERD (gastroesophageal reflux disease) 2012  Limited diet & aciphex   High blood pressure 1995  Hyperlipidemia 2010  Lipitor  Hypertension   Hypothyroidism   Instability of joints of both ankles   poor musculature; needs orthotic shoes  Irritable bowel syndrome 1977  Intermittent, chronic diet controlled  Osteoporosis   Overactive bladder 05/19/2024  Rosacea 10/19/2005  Scoliosis   Seizure   Stroke   Tinnitus   Urge Incontinence Of Urine 10/19/2005 [2] Past Surgical History:Procedure Laterality Date  COLONOSCOPY  Every 10 years  CRANIOTOMY    AVM Repair  HX TONSILLECTOMY/ADENOIDECTOMY    Tonsillectomy Conversion Data   PR XCAPSL CTRC RMVL INSJ IO LENS PROSTH W/O ECP Left 07/01/2024  Procedure: PHACO W/ IOL;  Surgeon: Crouch Bonnita Roel, MD;  Location: [facility];   Service: Ophthalmology  TONSILLECTOMY    WRIST FRACTURE SURGERY  [phone]  Reconstruction, plates

## 2024-07-15 NOTE — Discharge Instructions (Addendum)
 Patient Instructions After Cataract SurgeryName: [name]: 1956/01/19     Age: 68 y.o.MRN: [id number] Date: 8/26/2025Procedure: Cataract extraction with placement of intraocular lens, right eyeYour follow-up appointment is with Dr Oneta on 1pm at 8/27Location:  [facility], [place]  Medications - Please resume all of your oral / systemic medications at this time, including any blood thinners (aspirin, warfarin (Coumadin), or Plavix), if they were held prior to surgery - Please start your postoperative eye drops (shake well): Moxifloxacin  Prednisolone  Acetate  Eye Shield Day of Surgery8/26 One Drop Four times a day One Drop Four times a day Until you get home Week 18/27 One Drop Four times a day One Drop Four times a day At bed time for  7 days Week 29/3 stop One Drop Three times a day stop Week 39/10 stop One Drop Twice a day stop Week 49/17 stop One Drop Once a day stop  - You may also take Tylenol  325 mg four times daily as needed for discomfortActivities - The recovery for vision is variable depending on the amount of swelling in your eye. It may take several days to weeks for your vision to reach its' maximum. In general, vision is reasonable and allows most of our normal activities with only minor limitations. - Avoid strenuous activity, heavy lifting or swimming for the first week after surgery. - You may drive if the vision in your other eye is sufficient for safe driving and your surgical eye is comfortable.Wound Care -  Wear patch and shield until you get home, then only wear the shield at night for 7 days.             - Please patch and shield on postoperative day 1 after the surgery and then, wear the shield only at night time on the day after surgery for the first week.             - You can shower starting on the day after surgery - avoid directly bathing  the surgical eye with water. - Use a moistened cotton ball to clean the debris from your lashes in the morning.             - Avoid bending over for the first weekDiet - As usualQuestions or Concerns - Patients usually experience some mild discomfort after surgery. If you have any discomfort after surgery, you can take 2 tablets of Tylenol  (325 mg per tablet) every four hours. - If you develop significant pain, vomiting, redness, tearing, sensitivity to light, discharge, or loss of vision in your surgical eye, please call [phone] for an urgent appointment. - After office hours, you will be connected to the answering service. Please let them know that you are a patient of Dr. Oneta and ask for the eye resident on call.Laser Surgery after Cataract SurgeryIn cataract surgery today, your natural lens is removed, leaving behind a membrane or capsule. This capsule supports the new artificial lens. Over time, the capsule may become cloudy and blur your vision. If this occurs, a laser may be used to create a pupil in the membrane. The laser is a simple procedure. However, it is only recommended if the haze develops in the capsule. You will be monitored for this occurrence after cataract surgery.Authored by Caroll Legions, MD on 07/15/2024 at 11:00 AM About your medications from today[x]  Prescription information provided from onsite pharmacy. []  Prescription information given to patient and/or patient representative, prescription not filled at onsite pharmacy.[]  No Prescription given.Your  last pain medication was given to you at: Tylenol  at 10:10 am Your next dose of pain medication is due after: Tylenol  at 4:10 pm

## 2024-07-16 ENCOUNTER — Encounter: Payer: Self-pay | Admitting: Ophthalmology

## 2024-07-16 ENCOUNTER — Ambulatory Visit: Attending: Ophthalmology | Admitting: Ophthalmology

## 2024-07-16 DIAGNOSIS — H52223 Regular astigmatism, bilateral: Secondary | ICD-10-CM | POA: Insufficient documentation

## 2024-07-16 DIAGNOSIS — Z9842 Cataract extraction status, left eye: Secondary | ICD-10-CM | POA: Insufficient documentation

## 2024-07-16 DIAGNOSIS — Z961 Presence of intraocular lens: Secondary | ICD-10-CM | POA: Insufficient documentation

## 2024-07-16 DIAGNOSIS — H168 Other keratitis: Secondary | ICD-10-CM | POA: Insufficient documentation

## 2024-07-18 ENCOUNTER — Telehealth: Payer: Self-pay | Admitting: Primary Care

## 2024-07-18 NOTE — Telephone Encounter (Signed)
 Patient requesting Rx for Moderna Spikevax be sent to [facility] at [address] Writer could not determine which Moderna Spikevax to choose to queue this up. Pharmacy added to pt's list.Yesenia Mane, LPN 1/70/7974 5:90 PM

## 2024-07-18 NOTE — Telephone Encounter (Signed)
 Please let patient know that I am still waiting for more information regarding the dosage to order.  We do not have enough information about this yet.  She will have to wait until next week.

## 2024-07-18 NOTE — Telephone Encounter (Signed)
 Patient is aware and will follow up after pharmacy receives COVID vaccines.Dorothe Miners, LPN 1/70/7974 4:88 PM

## 2024-07-22 ENCOUNTER — Other Ambulatory Visit: Payer: Self-pay | Admitting: Specialist

## 2024-07-22 MED ORDER — PREDNISOLONE ACETATE 1 % OP SUSP *I*
OPHTHALMIC | 0 refills | Status: DC
Start: 2024-07-22 — End: 2024-08-07

## 2024-07-23 ENCOUNTER — Telehealth: Payer: Self-pay | Admitting: Primary Care

## 2024-07-23 MED ORDER — COVID-19 MRNA VACC (MODERNA) 10 MCG/0.2ML IM SUSY *A*
0.2000 mL | PREFILLED_SYRINGE | Freq: Once | INTRAMUSCULAR | 0 refills | Status: AC
Start: 2024-07-23 — End: 2024-07-23

## 2024-07-23 NOTE — Telephone Encounter (Signed)
 Can you call [facility] and be sure that this is the 2025-2026 covid vaccine. If so, is the order Moderna Spikevax? And do they need a specific diagnosis or can they put it through just based on her age >53

## 2024-07-23 NOTE — Telephone Encounter (Signed)
 Pt calls today because she is scheduled at [facility] [address] tomorrow 07/24/24 at 9:30 AM to have a Covid vaccine. Pt states that [facility] needs a script sent to them to do this. Pt has had the Moderna in the past. That needs to be specified on the script.

## 2024-07-23 NOTE — Telephone Encounter (Signed)
 Spoke to pharmacist at [facility]. Verified that the Covid vaccine is for 2025-2026. The order for the prescription is MNEXSPIKE. Confirmed the dx can be >65.

## 2024-07-23 NOTE — Telephone Encounter (Signed)
 Pls let her know that the script was sent

## 2024-07-23 NOTE — Telephone Encounter (Signed)
 Pt.notified

## 2024-07-24 ENCOUNTER — Other Ambulatory Visit: Payer: Self-pay

## 2024-07-25 ENCOUNTER — Encounter: Payer: Self-pay | Admitting: Optometry

## 2024-07-25 ENCOUNTER — Ambulatory Visit: Attending: Optometry | Admitting: Optometry

## 2024-07-25 DIAGNOSIS — Z9842 Cataract extraction status, left eye: Secondary | ICD-10-CM | POA: Insufficient documentation

## 2024-07-25 DIAGNOSIS — Z961 Presence of intraocular lens: Secondary | ICD-10-CM | POA: Insufficient documentation

## 2024-07-25 NOTE — Progress Notes (Signed)
 Assessment: 1. S/P CE IOL OD  07/15/24 ENVISTA 19D (Primary)Excellent outcome by Dr Isidro. S/P CE IOL OS 07/01/24 EE 19DAs above  Plan: Assured patient as to an excellent post operative result. Reviewed post operative eye drop regimen, physical restrictions, addressed all questions and concerns. RTC 37M for MR, IOP OU for post op care.

## 2024-07-28 ENCOUNTER — Encounter: Payer: Self-pay | Admitting: Gastroenterology

## 2024-07-31 ENCOUNTER — Ambulatory Visit: Attending: Primary Care | Admitting: Student in an Organized Health Care Education/Training Program

## 2024-07-31 ENCOUNTER — Other Ambulatory Visit: Payer: Self-pay

## 2024-07-31 ENCOUNTER — Telehealth: Payer: Self-pay | Admitting: Ophthalmology

## 2024-07-31 DIAGNOSIS — Z9842 Cataract extraction status, left eye: Secondary | ICD-10-CM | POA: Insufficient documentation

## 2024-07-31 DIAGNOSIS — H01009 Unspecified blepharitis unspecified eye, unspecified eyelid: Secondary | ICD-10-CM | POA: Insufficient documentation

## 2024-07-31 DIAGNOSIS — Z961 Presence of intraocular lens: Secondary | ICD-10-CM | POA: Insufficient documentation

## 2024-07-31 NOTE — Telephone Encounter (Signed)
 Spoke with patient. Patient stated starting today 07/31/24 they are experiencing OD redness with blood pooling over the white of their eye and the blood dripped on their lower lid. Patient is scheduled this morning in [facility].

## 2024-07-31 NOTE — Progress Notes (Addendum)
 ______________________________________________  ______________________________________________Urgent Eye Clinic NotePatient name: [name]: 1955/12/06       Age: 68 y.o.MR#: [id number] Encounter Date: 9/11/2025Subjective Subjective HPI  S/P PHACO w/ IOL right eye. (07/15/24)S/P PHACO w/ IOL left eye. (07/01/24)Patient states there was a red dot of blood inferiorly in the right eye since this morning.No eye pain. She does not have her final correction yet for either eye. (+) light sensitivity since cataract surgery, but getting better.Ocular meds:PF BID OD Last edited by Sherre Prentice SQUIBB, MD on 07/31/2024  9:55 AM.     Past Medical, Past Surgical, Allergies and Current Rx:  Medical History: Past Medical History[1] Surgical History: Past Surgical History[2] Allergies: Bactrim, Dilantin [phenytoin], Penicillins, Seasonal allergies, and Sulfa antibiotics Current Medications: Current Outpatient Medications Medication Instructions  acetaminophen  (ACETAMINOPHEN ) 1,000 mg, EVERY 6 HOURS PRN  amLODIPine  (NORVASC ) 5 mg, Oral, DAILY  atorvastatin  (LIPITOR) 10 mg tablet TAKE 1 TABLET BY MOUTH EVERY DAY WITH DINNER  azelastine  (ASTEPRO ) 137 MCG/SPRAY SOLN nasal solution SPRAY 2 SPRAYS INTO EACH NOSTRIL TWO TIMES DAILY  Calcium  Carb-Cholecalciferol  600-200 MG-UNIT TABS 1 tablet, Oral, 3 TIMES DAILY  carBAMazepine  (CARBATROL ) 100 mg, Oral, 2 TIMES DAILY  carBAMazepine  (TEGRETOL  XR) 200 mg 12 hr tablet TAKE 2 TABLETS BY MOUTH TWO TIMES DAILY WITH MEALS ALONG WITH THE 100MG  TABLET SWALLOW WHOLE, DO NOT BREAK, CRUSH OR CHEW  cholecalciferol  (VITAMIN D ) 1,000 units, DAILY  [facility] 12 HOUR NASAL DECONGESTANT 120 MG 12 hr tablet TAKE 1 TABLET BY MOUTH EVERY 12 HOURS  Denosumab  (PROLIA  SC) 1 device, EVERY 6 MONTHS  econazole nitrate  (SPECTAZOLE ) 1 % cream Topical, DAILY, to the following areas: TOES/TOENAIL  famotidine  (PEPCID ) 20 mg, Oral, 2  TIMES DAILY  fluticasone  (FLONASE ) 50 MCG/ACT nasal spray 1 spray, Nasal, DAILY  fluticasone -salmeterol (ADVAIR/WIXELA) 250-50 MCG/ACT diskus inhaler 1 puff, Inhalation, 2 TIMES DAILY  generic DME Bilateral custom orthotics; Dx: bilateral foot pain  generic DME PT evaluate and treat, strengthening and conditioning dx: right foot and back pain  generic DME PT evaluate and treat, dx: right foot pain  generic DME PT evaluate and treat, dx: low back pain  guaiFENesin (MUCINEX) 1,200 mg, 2 TIMES DAILY  ketotifen (ZADITOR) 0.035 % ophthalmic solution 1 drop, 2 TIMES DAILY PRN  L-Glutamine 500 mg, DAILY  levothyroxine  (SYNTHROID , LEVOTHROID) 25 mcg, Oral, DAILY BEFORE BREAKFAST  loratadine (CLARITIN) 10 mg, DAILY  MethylFolate 1,000 mcg, DAILY  metroNIDAZOLE (METROCREAM) 0.75 % cream 1 Application, DAILY  mirabegron  (MYRBETRIQ ) 25 mg, Oral, DAILY  moxifloxacin  (VIGAMOX ) 0.5 % ophthalmic solution Use as directed in discharge instructions.  For refills, contact doctor's office.  moxifloxacin  (VIGAMOX ) 0.5 % ophthalmic solution Use as directed in discharge instructions.  For refills, contact doctor's office.  moxifloxacin  (VIGAMOX ) 0.5 % ophthalmic solution Use as directed in discharge instructions. For refills, contact doctor's office.  Multiple Vitamin (MULTIVITAMIN) per tablet 1 tablet, DAILY  Non-System Medication Bilateral custom-made orthoticsDx: bilateral foot pain, history of stroke, disp: 1pair  NONFORMULARY, OTHER, ORDER *I* 1 capsule, DAILY  NONFORMULARY, OTHER, ORDER *I* 1 capsule, DAILY  NONFORMULARY, OTHER, ORDER *I* 1 tablet, DAILY  polyethylene glycol (GLYCOLAX,MIRALAX) 17 g, DAILY  prednisoLONE  acetate (PRED FORTE ) 1 % ophthalmic suspension Use as directed in discharge instructions.  For refills, contact doctor's office.  prednisoLONE  acetate (PRED FORTE ) 1 % ophthalmic suspension Use as directed in discharge instructions.  For refills, contact doctor's  office.  prednisoLONE  acetate (PRED FORTE ) 1 % ophthalmic suspension Use as directed in discharge instructions. For refills, contact doctor's office.  pseudoephedrine  (SUDAFED) 30 mg tablet TAKE 1 TABLET BY MOUTH EVERY 4 HOURS AS NEEDED FOR CONGESTION  RABEprazole  (ACIPHEX ) 20 mg, Oral, DAILY  simethicone (MYLICON) 80 mg, PRN  solifenacin  (VESICARE ) 10 mg, Oral, DAILY  triamcinolone  (KENALOG ) 0.1 % ointment Topical, 2 TIMES DAILY, to the following areas: ear  zinc sulfate (ZINCATE) 50 mg, DAILY   'Objective Objective  Visual Acuity (Snellen - Linear)     Right Left  Dist sc  20/50 -1/+1  Dist cc 20/30 -2   Dist ph sc  20/40 +2   Pupils     Pupils Dark Light Shape React APD  Right PERRLA 5 3 Round Brisk None  Left PERRLA 5 3 Round Brisk None    Base Eye Exam   Visual Acuity (Snellen - Linear)     Right Left  Dist sc  20/50 -1/+1  Dist cc 20/30 -2   Dist ph sc  20/40 +2    Tonometry (Tonopen, 9:35 AM)     Right Left  Pressure 11 13    Pupils     Pupils Dark Light Shape React APD  Right PERRLA 5 3 Round Brisk None  Left PERRLA 5 3 Round Brisk None    Visual Fields     Left Right   Full Full    Extraocular Movement     Right Left   Full, Ortho Full, Ortho    Neuro/Psych   Oriented x3: Yes  Mood/Affect: Normal    Slit Lamp and Fundus Exam   External Exam     Right Left  External Normal ocular adnexae, lacrimal gland & drainage, orbits Normal ocular adnexae, lacrimal gland & drainage, orbits    Slit Lamp Exam     Right Left  Lids/Lashes Lash debris upper and lower lid. Fine patch of telangectasia 1mm lower temporal lid margin3+ Dermatochalasis - upper lid 2+ Dermatochalasis - upper lid  Conjunctiva/Sclera Normal bulbar/palpebral, conjunctiva, sclera Normal bulbar/palpebral, conjunctiva, sclera  Cornea Normal epithelium,  stroma, endothelium, tear film; well apposed CC ST wound. Normal epithelium, stroma, endothelium, tear film; well apposed CC ST wound.  Anterior Chamber Clear & deep Clear & deep  Iris Normal shape, size, morphology Normal shape, size, morphology  Lens Centered posterior chamber intraocular lens, no posterior capsular opacification Centered posterior chamber intraocular lens, no posterior capsular opacification  Anterior Vitreous Clear Clear      Orders Placed This Encounter No orders placed during this encounter. Imaging Studies: Assessment/Plan 1. Blepharitis, unspecified laterality, unspecified type  2. S/P CE IOL OD  07/15/24 ENVISTA 19D  3. S/P CE IOL OS 07/01/24 EE 19D  Recent cataract surgery, patient tapering prednisolone  in the right eye as prescribed.  Found to have small patch of telangiectasia on the right lower lid margin temporally.  Upper and lower lash debris consistent with blepharitis.Plan:- Continue prednisolone  taper as prescribed- Start artificial tears 3-4 times per day- Recommend warm compress- Follow-up as previously scheduledSeen by and discussed with supervising attending, attestation to follow Orders Placed This Encounter No orders placed during this encounter. Follow-up previous specialists as scheduled .RTC precautions reviewed; if experiencing new changes in vision, blurry vision, eye pain, and sensitivity to light or if your symptoms do not improve or continue to worsen despite these measures, please feel free to call us  anytime at [phone] -------------------------------------------------------------------------------------------------------------------------------------------------------------------This note was created in part using Dragon dictation software, grammatical errors will invariably arise and have attempted to be eliminated. Kennyth Free, MDOphthalmology PGY-39/09/2024 at  10:02 AMI personally saw and  evaluated the patient. I agree with the resident's findings and plan of care as documented above.I personally discussed exam findings and plan with the patient in details and answered questions. Jeffory Crouch, MD  [1] Past Medical History:Diagnosis Date  Asymmetrical Sensorineural Hearing Loss 01/27/2010  AVM (arteriovenous malformation)   supratentorial complex  Contusion of foot 06/02/2013  GERD (gastroesophageal reflux disease) 2012  Limited diet & aciphex   High blood pressure 1995  Hyperlipidemia 2010  Lipitor  Hypertension   Hypothyroidism   Instability of joints of both ankles   poor musculature; needs orthotic shoes  Irritable bowel syndrome 1977  Intermittent, chronic diet controlled  Osteoporosis   Overactive bladder 05/19/2024  Rosacea 10/19/2005  Scoliosis   Seizure   Stroke   Tinnitus   Urge Incontinence Of Urine 10/19/2005 [2] Past Surgical History:Procedure Laterality Date  COLONOSCOPY  Every 10 years  CRANIOTOMY    AVM Repair  HX TONSILLECTOMY/ADENOIDECTOMY    Tonsillectomy Conversion Data   PR XCAPSL CTRC RMVL INSJ IO LENS PROSTH W/O ECP Left 07/01/2024  Procedure: PHACO W/ IOL;  Surgeon: Crouch Bonnita Lobos, MD;  Location: [facility];  Service: Ophthalmology  PR XCAPSL CTRC RMVL INSJ IO LENS PROSTH W/O ECP Right 07/15/2024  Procedure: PHACO W/ IOL; +/- CAPSULE STAIN;  Surgeon: Crouch Bonnita Lobos, MD;  Location: [facility];  Service: Ophthalmology  TONSILLECTOMY    WRIST FRACTURE SURGERY  [phone]  Reconstruction, plates

## 2024-08-05 ENCOUNTER — Other Ambulatory Visit
Admission: RE | Admit: 2024-08-05 | Discharge: 2024-08-05 | Disposition: A | Discharge status: Home / Self Care | Source: Ambulatory Visit | Attending: Primary Care | Admitting: Primary Care

## 2024-08-05 ENCOUNTER — Other Ambulatory Visit: Payer: Self-pay

## 2024-08-05 DIAGNOSIS — M810 Age-related osteoporosis without current pathological fracture: Secondary | ICD-10-CM | POA: Insufficient documentation

## 2024-08-05 DIAGNOSIS — E7800 Pure hypercholesterolemia, unspecified: Secondary | ICD-10-CM | POA: Insufficient documentation

## 2024-08-05 DIAGNOSIS — G40909 Epilepsy, unspecified, not intractable, without status epilepticus: Secondary | ICD-10-CM | POA: Insufficient documentation

## 2024-08-05 DIAGNOSIS — E034 Atrophy of thyroid (acquired): Secondary | ICD-10-CM | POA: Insufficient documentation

## 2024-08-05 DIAGNOSIS — H903 Sensorineural hearing loss, bilateral: Secondary | ICD-10-CM | POA: Insufficient documentation

## 2024-08-05 DIAGNOSIS — E559 Vitamin D deficiency, unspecified: Secondary | ICD-10-CM | POA: Insufficient documentation

## 2024-08-05 LAB — CBC
Hematocrit: 40 % (ref 34–49)
Hemoglobin: 13.1 g/dL (ref 11.2–16.0)
MCV: 104 fL — ABNORMAL HIGH (ref 75–100)
Platelets: 220 THOU/uL (ref 150–450)
RBC: 3.8 MIL/uL — ABNORMAL LOW (ref 4.0–5.5)
RDW: 13.2 % (ref 0.0–15.0)
WBC: 4.3 THOU/uL (ref 3.5–11.0)

## 2024-08-05 LAB — COMPREHENSIVE METABOLIC PANEL
ALT: 25 U/L (ref 0–35)
AST: 22 U/L (ref 0–35)
Albumin: 4.6 g/dL (ref 3.5–5.2)
Alk Phos: 71 U/L (ref 35–105)
Anion Gap: 12 (ref 7–16)
Bilirubin,Total: 0.3 mg/dL (ref 0.0–1.2)
CO2: 27 mmol/L (ref 20–28)
Calcium: 9.8 mg/dL (ref 8.6–10.2)
Chloride: 100 mmol/L (ref 96–108)
Creatinine: 0.6 mg/dL (ref 0.51–0.95)
Glucose: 95 mg/dL (ref 60–99)
Lab: 19 mg/dL (ref 6–20)
Potassium: 4.7 mmol/L (ref 3.3–5.1)
Sodium: 139 mmol/L (ref 133–145)
Total Protein: 7.1 g/dL (ref 6.3–7.7)
eGFR BY CREAT: 96

## 2024-08-05 LAB — LIPID PANEL
Chol/HDL Ratio: 1.9
Cholesterol: 216 mg/dL — AB
HDL: 114 mg/dL — ABNORMAL HIGH (ref 40–60)
LDL Calculated: 89 mg/dL
Non HDL Cholesterol: 102 mg/dL
Triglycerides: 77 mg/dL

## 2024-08-05 LAB — VITAMIN D: 25-OH Vit Total: 42 ng/mL (ref 30–60)

## 2024-08-05 LAB — TSH: TSH: 2.4 u[IU]/mL (ref 0.27–4.20)

## 2024-08-07 ENCOUNTER — Other Ambulatory Visit: Payer: Self-pay | Admitting: Ophthalmology

## 2024-08-07 ENCOUNTER — Other Ambulatory Visit: Payer: Self-pay | Admitting: Optometry

## 2024-08-07 LAB — N-TELOPEPTIDE, URINE
Creat,Ur: 58 mg/dL
NTX Telopeptide: 14

## 2024-08-08 LAB — CARBAMAZEPINE, FREE & TOTAL
Carbamazepine, Free: 20 % (ref 8.0–35.0)
Carbamazepine,Free: 2 ug/mL (ref 1.0–3.0)
Carbamazepine,Total: 10 ug/mL (ref 4.0–12.0)

## 2024-08-13 ENCOUNTER — Ambulatory Visit: Admitting: Primary Care

## 2024-08-14 ENCOUNTER — Other Ambulatory Visit

## 2024-08-14 ENCOUNTER — Other Ambulatory Visit: Payer: Self-pay

## 2024-08-14 ENCOUNTER — Encounter: Payer: Self-pay | Admitting: Primary Care

## 2024-08-14 ENCOUNTER — Ambulatory Visit: Attending: Primary Care | Admitting: Primary Care

## 2024-08-14 VITALS — BP 130/74 | HR 76 | Wt 113.4 lb

## 2024-08-14 DIAGNOSIS — I10 Essential (primary) hypertension: Secondary | ICD-10-CM | POA: Insufficient documentation

## 2024-08-14 DIAGNOSIS — M810 Age-related osteoporosis without current pathological fracture: Secondary | ICD-10-CM | POA: Insufficient documentation

## 2024-08-14 DIAGNOSIS — E785 Hyperlipidemia, unspecified: Secondary | ICD-10-CM | POA: Insufficient documentation

## 2024-08-14 DIAGNOSIS — E039 Hypothyroidism, unspecified: Secondary | ICD-10-CM | POA: Insufficient documentation

## 2024-08-14 DIAGNOSIS — E559 Vitamin D deficiency, unspecified: Secondary | ICD-10-CM | POA: Insufficient documentation

## 2024-08-14 DIAGNOSIS — N3281 Overactive bladder: Secondary | ICD-10-CM | POA: Insufficient documentation

## 2024-08-14 DIAGNOSIS — G40909 Epilepsy, unspecified, not intractable, without status epilepticus: Secondary | ICD-10-CM | POA: Insufficient documentation

## 2024-08-14 NOTE — Progress Notes (Signed)
 Reason for Visit: [name] has already received her COVID vaccine at [facility] son, Toribio is coming in on ThursdayWill get her flu shot at the pharmacy after Daniel's visit, as it can sometimes make her feel ill and she wants to be well for the visit She has generally been wellIs following up with [facility] ortho for her osteoporosis and proliaShe is tolerating the prolia  well and the urine NTX does show low bone turnover, which indicates positive response to proliaShe remains on solifenacin  for overactive bladder, but does plan to trial myrbetriqBPs well controlled on amlodipinePatient Active Problem List Diagnosis Code  Reported Trauma Neck   Allergic rhinitis J30.9  Irritable bowel syndrome K58.9  Essential hypertension I10  Dysplastic Nevus I78.1  Rosacea L71.9  Osteoporosis M81.0  Hypothyroidism E03.9  Scoliosis M41.20  Urge Incontinence Of Urine N39.41  A-V Malformation Repair Supratentorial Complex   Seizure disorder G40.909  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  Ringing In The Ears (Tinnitus) H93.19  GERD (gastroesophageal reflux disease) K21.9  Pain in joint, ankle and foot M25.579  Weakness of foot R29.898  Hyperlipidemia E78.5  Right ankle pain M25.571  Overactive bladder N32.81  S/P CE IOL OS 07/01/24 IOL +19.00D ENVISTA Z98.49  S/P CE IOL OD  07/15/24 ENVISTA 19D Z96.1 Current Outpatient Medications Medication  prednisoLONE  acetate (PRED FORTE ) 1 % ophthalmic suspension  moxifloxacin  (VIGAMOX ) 0.5 % ophthalmic solution  moxifloxacin  (VIGAMOX ) 0.5 % ophthalmic solution  prednisoLONE  acetate (PRED FORTE ) 1 % ophthalmic suspension  prednisoLONE  acetate (PRED FORTE ) 1 % ophthalmic suspension  moxifloxacin  (VIGAMOX ) 0.5 % ophthalmic solution  simethicone (MYLICON) 80 MG chewable tablet  carBAMazepine  (CARBATROL ) 100 mg 12 hr capsule  ketotifen (ZADITOR) 0.035 % ophthalmic solution  Acetaminophen  500  mg tablet  polyethylene glycol (GLYCOLAX,MIRALAX) 17 g powder packet  L-Glutamine 500 MG CAPS  zinc sulfate (ZINCATE) 220 (50 Zn) MG capsule  NONFORMULARY, OTHER, ORDER *I*  NONFORMULARY, OTHER, ORDER *I*  Levomefolate Glucosamine (METHYLFOLATE) 400 MCG CAPS  NONFORMULARY, OTHER, ORDER *I*  carBAMazepine  (TEGRETOL  XR) 200 mg 12 hr tablet  levothyroxine  (SYNTHROID , LEVOTHROID) 25 mcg tablet  mirabegron  (MYRBETRIQ ) 25 mg 24 hr tablet  RABEprazole  (ACIPHEX ) 20 MG tablet  azelastine  (ASTEPRO ) 137 MCG/SPRAY SOLN nasal solution  generic DME  generic DME  fluticasone -salmeterol (ADVAIR/WIXELA) 250-50 MCG/ACT diskus inhaler  atorvastatin  (LIPITOR) 10 mg tablet  amLODIPine  (NORVASC ) 5 mg tablet  triamcinolone  (KENALOG ) 0.1 % ointment  solifenacin  (VESICARE ) 10 mg tablet  fluticasone  (FLONASE ) 50 MCG/ACT nasal spray  econazole nitrate  (SPECTAZOLE ) 1 % cream  generic DME  guaiFENesin (MUCINEX) 600 MG 12 hr tablet  generic DME  metroNIDAZOLE (METROCREAM) 0.75 % cream  famotidine  (PEPCID ) 20 mg tablet  pseudoephedrine  (SUDAFED) 30 mg tablet  [employer] 12 HOUR NASAL DECONGESTANT 120 MG 12 hr tablet  Non-System Medication  Calcium  Carb-Cholecalciferol  600-200 MG-UNIT TABS  Denosumab  (PROLIA  SC)  loratadine (CLARITIN) 10 mg tablet  Multiple Vitamin (MULTIVITAMIN) per tablet  cholecalciferol  (VITAMIN D ) 1000 UNITS capsule No current facility-administered medications for this visit. Medication list reviewed and updated, no changes were made todayExam: BP 130/74   Pulse 76   Wt 51.4 kg (113 lb 6.4 oz)   SpO2 98%   BMI 20.09 kg/m Well-nourished alert and conversant, no acute distress.  Neck is supple without lad, lungs are CTA bilaterally, resp easy.  Cardiac exam reveals nl S1S2 RRR.  Abdomen is soft, nontender, normoactive BS appreciated.  No organomegaly noted.  Extremities reveal 2+DP and PT pulses and no edema.Recent Results (from the past 2  weeks) Lipid Panel (Reflex to Direct  LDL if Triglycerides more than 400)  Collection Time: 08/05/24  8:43 AM Result Value Ref Range  Cholesterol 216 (!) mg/dL  Triglycerides 77 mg/dL  HDL 885 (H) 40 - 60 mg/dL  LDL Calculated 89 mg/dL  Non HDL Cholesterol 897 mg/dL  Chol/HDL Ratio 1.9  Comprehensive metabolic panel  Collection Time: 08/05/24  8:43 AM Result Value Ref Range  Sodium 139 133 - 145 mmol/L  Potassium 4.7 3.3 - 5.1 mmol/L  Chloride 100 96 - 108 mmol/L  CO2 27 20 - 28 mmol/L  Anion Gap 12 7 - 16  UN 19 6 - 20 mg/dL  Creatinine 9.39 9.48 - 0.95 mg/dL  eGFR BY CREAT 96   Glucose 95 60 - 99 mg/dL  Calcium  9.8 8.6 - 10.2 mg/dL  Total Protein 7.1 6.3 - 7.7 g/dL  Albumin 4.6 3.5 - 5.2 g/dL  Bilirubin,Total 0.3 0.0 - 1.2 mg/dL  AST 22 0 - 35 U/L  ALT 25 0 - 35 U/L  Alk Phos 71 35 - 105 U/L CBC  Collection Time: 08/05/24  8:43 AM Result Value Ref Range  WBC 4.3 3.5 - 11.0 THOU/uL  RBC 3.8 (L) 4.0 - 5.5 MIL/uL  Hemoglobin 13.1 11.2 - 16.0 g/dL  Hematocrit 40 34 - 49 %  MCV 104 (H) 75 - 100 fL  RDW 13.2 0.0 - 15.0 %  Platelets 220 150 - 450 THOU/uL TSH  Collection Time: 08/05/24  8:43 AM Result Value Ref Range  TSH 2.40 0.27 - 4.20 uIU/mL Vitamin D   Collection Time: 08/05/24  8:43 AM Result Value Ref Range  25-OH Vit Total 42 30 - 60 ng/mL N-telopeptide, urine  Collection Time: 08/05/24  8:43 AM Result Value Ref Range  NTX Telopeptide 14   Creat,Ur 58 mg/dL Carbamazepine , free & total  Collection Time: 08/05/24  8:43 AM Result Value Ref Range  Carbamazepine ,Free 2.0 1.0 - 3.0 ug/mL  Carbamazepine , Free 20.0 8.0 - 35.0 %  Carbamazepine ,Total 10.0 4.0 - 12.0 ug/mL  A/P:HTN: well controlled on amlodipineHyperlipidemia: LDL at goal on atorvastatinOveractive bladder: will trial myrbetriq  when she doesn't have a lot going on in order to monitor effectivenessHypothyroidism: TSH wnl on  levothyroxineSeizure prophylaxis: remains on carbamazepine , levels appropriateOsteoporosis: awaiting DEXA results, continue prolia  Addendum: Lumbar t score -2.6 from -2.3 (2023)Total left hip -2.7 from -3.0 (2023)Total right hip -3.0 from -3.4 (2023)Overall general trend in hips is improved, with mild progression in the lumbar spinePlan for follow up in three months

## 2024-08-15 ENCOUNTER — Encounter: Payer: Self-pay | Admitting: Orthopedic Surgery

## 2024-08-15 ENCOUNTER — Ambulatory Visit
Admission: RE | Admit: 2024-08-15 | Discharge: 2024-08-15 | Disposition: A | Discharge status: Home / Self Care | Source: Ambulatory Visit

## 2024-08-15 ENCOUNTER — Ambulatory Visit: Attending: Orthopedic Surgery | Admitting: Orthopedic Surgery

## 2024-08-15 VITALS — BP 148/86 | HR 71 | Temp 97.7°F | Ht 63.75 in | Wt 112.2 lb

## 2024-08-15 DIAGNOSIS — M810 Age-related osteoporosis without current pathological fracture: Secondary | ICD-10-CM | POA: Insufficient documentation

## 2024-08-15 MED ORDER — DENOSUMAB 60 MG/ML SC SOLN *I*
60.0000 mg | Freq: Once | SUBCUTANEOUS | Status: AC
Start: 2024-08-15 — End: 2024-08-15
  Administered 2024-08-15: 60 mg via SUBCUTANEOUS

## 2024-08-15 NOTE — Progress Notes (Signed)
 CC: Osteoporosis follow up [name] is a 68 y.o. female presenting for osteoporosis follow up. Medication history:Fosamax and actonel for a number of years, caused some esophageal problems Forteo x 2 yearsProlia for the past 4-5 years, with the last injection being 06/20/23  Fracture history: 2007 wrist fracture from slipping on ice2010 wrist fracture from slipping on a rug2024 ankle fracture from a fall   Servings of calcium  in daily diet: Yogurt and cheese  Calcium  supplement:600mg  daily  Vitamin D  supplement: 1000 units daily  Weightbearing exercise: Does whole body exercises with her personal trainer including lifting light free weights  Of systemic steroids, anticonvulsants, lasix, PPIs and hormone deprivation therapy, pt takes: Rabeprazole  , fluticasone  inhaler currently for an infection, fluticasone  every day  DEXA: DEXA scan was personally reviewed by me and discussed with the patient.  [place] [facility]Date: 9/26/25Previous: 07/25/22 at [facility] T-score 2025 T-score 2023 Worst spine vertebra -3.3 -2.7 Left femoral neck -3.5 -3.4 Right femoral neck -4.3 -3.9 Left total hip -2.7 -3.0 Right total hip  -3.0 -3.4 Past Medical History[1]ROS reviewed and pertinent positives noted in HPI. Labs:   Lab results: 09/16/250843 Sodium 139 Potassium 4.7 Chloride 100 CO2 27 UN 19 Creatinine 0.60 Glucose 95 Calcium  9.8   Lab results: 09/16/250843 25-OH Vit Total 42  Last ResultResult Component Last Value NTX Telopeptide 14 (08/05/2024) Physical exam: Patient is alert and oriented with equal pupils and symmetric facial movements. Breathing is normal and nonlabored. Vitals are within acceptable limits. Pt ambulates without any assistive device.Assessment/plan: Pt has osteoporosis as diagnosed by Dexa scan. Dexa were  done at 2 different facilities, numbers are subjected to approximation. We discussed getting as new Dexa next year to ensure that she is not truly losing more bone density despite Prolia . Labs are acceptable. I recommended that pt receive Prolia  today. We discussed administration, risks and benefits and pt was invited to ask questions which were then answered. Pt elected to receive injection. Pt denies current infection and major dental work +/- 3 months. Prolia  injection given today. Continue calcium  and D. F/u in 6 months with new cal, creat, VIT D, NTX. NEXT DEXA: Sept 2027Prolia 60mg  SQ given today by me in left arm.LOT [id number] EXP [id number] [1] Past Medical History:Diagnosis Date  Asymmetrical Sensorineural Hearing Loss 01/27/2010  AVM (arteriovenous malformation)   supratentorial complex  Contusion of foot 06/02/2013  GERD (gastroesophageal reflux disease) 2012  Limited diet & aciphex   High blood pressure 1995  Hyperlipidemia 2010  Lipitor  Hypertension   Hypothyroidism   Instability of joints of both ankles   poor musculature; needs orthotic shoes  Irritable bowel syndrome 1977  Intermittent, chronic diet controlled  Osteoporosis   Overactive bladder 05/19/2024  Rosacea 10/19/2005  Scoliosis   Seizure   Stroke   Tinnitus   Urge Incontinence Of Urine 10/19/2005

## 2024-08-20 NOTE — Progress Notes (Signed)
 Remote PPM Transmission

## 2024-08-21 ENCOUNTER — Other Ambulatory Visit: Payer: Self-pay

## 2024-08-22 ENCOUNTER — Ambulatory Visit: Attending: Optometry | Admitting: Optometry

## 2024-08-22 ENCOUNTER — Encounter: Payer: Self-pay | Admitting: Optometry

## 2024-08-22 DIAGNOSIS — Z961 Presence of intraocular lens: Secondary | ICD-10-CM | POA: Insufficient documentation

## 2024-08-22 DIAGNOSIS — Z9842 Cataract extraction status, left eye: Secondary | ICD-10-CM | POA: Insufficient documentation

## 2024-08-22 NOTE — Progress Notes (Signed)
 Assessment: 1. S/P CE IOL OD  07/15/24 ENVISTA 19D (Primary)Excellent outcome by Dr Isidro. S/P CE IOL OS 07/01/24 EE 19DAs above  Plan: Rx subjective findings in PAL design as optional change. Discussed findings and addressed concerns. RTC in 64M w/ DFE OU for CEE.

## 2024-08-25 ENCOUNTER — Encounter: Payer: Self-pay | Admitting: Optometry

## 2024-08-26 ENCOUNTER — Encounter: Payer: Self-pay | Admitting: Optometry

## 2024-08-27 ENCOUNTER — Encounter

## 2024-08-27 DIAGNOSIS — Z961 Presence of intraocular lens: Secondary | ICD-10-CM

## 2024-08-27 DIAGNOSIS — H26493 Other secondary cataract, bilateral: Secondary | ICD-10-CM

## 2024-09-02 ENCOUNTER — Other Ambulatory Visit: Payer: Self-pay | Admitting: Primary Care

## 2024-09-02 MED ORDER — ECONAZOLE NITRATE 1 % EX CREA *I*
TOPICAL_CREAM | Freq: Every day | CUTANEOUS | 1 refills | Status: AC
Start: 2024-09-02 — End: ?

## 2024-09-02 NOTE — Telephone Encounter (Signed)
 WRITER CALLED PT TO VERIFY THE PREFERRED PHARMACY.

## 2024-09-02 NOTE — Telephone Encounter (Signed)
 PT LVM TO REFILL PRESCRIPTION.

## 2024-09-03 ENCOUNTER — Other Ambulatory Visit: Payer: Self-pay

## 2024-09-03 ENCOUNTER — Telehealth: Payer: Self-pay | Admitting: Optometry

## 2024-09-03 ENCOUNTER — Ambulatory Visit: Attending: Ophthalmology

## 2024-09-03 ENCOUNTER — Ambulatory Visit: Admitting: Internal Medicine

## 2024-09-03 DIAGNOSIS — H1012 Acute atopic conjunctivitis, left eye: Secondary | ICD-10-CM | POA: Insufficient documentation

## 2024-09-03 DIAGNOSIS — H168 Other keratitis: Secondary | ICD-10-CM | POA: Insufficient documentation

## 2024-09-03 MED ORDER — OLOPATADINE HCL 0.2 % OP SOLN *A*
1.0000 [drp] | Freq: Every day | OPHTHALMIC | 0 refills | Status: AC
Start: 2024-09-03 — End: ?

## 2024-09-03 MED ORDER — MOXIFLOXACIN HCL 0.5 % OP SOLN *I*: 1.0000 [drp] | Freq: Four times a day (QID) | OPHTHALMIC | 0 refills | Status: DC

## 2024-09-03 NOTE — Progress Notes (Signed)
 I have examined the pt agree with the physical and assessment I have discussd the plan with the patient and resident

## 2024-09-03 NOTE — Progress Notes (Signed)
 ______________________________________________  ______________________________________________[facility] NotePatient name: [name]: 02-May-1956       Age: 68 y.o.MR#: [id number] Encounter Date: 10/15/2025Subjective [name] is a 68 y.o. female with PMH significant for HTN, HLD and seizures and POH significant for blepharitis and refractive error who presents with intermittent L eye pain for the last 2 weeks. Endorses epiphora, itchiness and photophobia. Denies any changes in vision. Pt endorses significant eye strain from reading and being on the computer a lot. The patient denies swelling, erythema, blurry vision, loss of vision/curtain coming down over visual field, loss of peripheral vision, and new headache.Vision aids: glasses and readersPrior ocular surgeries: Cataract surgery  * and NonePatient's current ocular medications: Artificial tears/ointment and NonePast Medical, Past Surgical, Allergies and Current Rx:   Medical History: Past Medical History[1] Surgical History: Past Surgical History[2] Allergies: Bactrim, Dilantin [phenytoin], Penicillins, Seasonal allergies, and Sulfa antibiotics Current Medications: Current Outpatient Medications Medication Instructions  acetaminophen  (ACETAMINOPHEN ) 1,000 mg, EVERY 6 HOURS PRN  amLODIPine  (NORVASC ) 5 mg, Oral, DAILY  atorvastatin  (LIPITOR) 10 mg tablet TAKE 1 TABLET BY MOUTH EVERY DAY WITH DINNER  azelastine  (ASTEPRO ) 137 MCG/SPRAY SOLN nasal solution SPRAY 2 SPRAYS INTO EACH NOSTRIL TWO TIMES DAILY  Calcium  Carb-Cholecalciferol  600-200 MG-UNIT TABS 1 tablet, Oral, 3 TIMES DAILY  carBAMazepine  (CARBATROL ) 100 mg, Oral, 2 TIMES DAILY  carBAMazepine  (TEGRETOL  XR) 200 mg 12 hr tablet TAKE 2 TABLETS BY MOUTH TWO TIMES DAILY WITH MEALS ALONG WITH THE 100MG  TABLET SWALLOW WHOLE, DO NOT BREAK, CRUSH OR CHEW  cholecalciferol  (VITAMIN D ) 1,000 units, DAILY  [employer] 12 HOUR NASAL  DECONGESTANT 120 MG 12 hr tablet TAKE 1 TABLET BY MOUTH EVERY 12 HOURS  Denosumab  (PROLIA  SC) 1 device, EVERY 6 MONTHS  econazole nitrate  (SPECTAZOLE ) 1 % cream Topical, DAILY, to the following areas: TOES/TOENAIL  famotidine  (PEPCID ) 20 mg, Oral, 2 TIMES DAILY  fluticasone  (FLONASE ) 50 MCG/ACT nasal spray 1 spray, Nasal, DAILY  fluticasone -salmeterol (ADVAIR/WIXELA) 250-50 MCG/ACT diskus inhaler 1 puff, Inhalation, 2 TIMES DAILY  generic DME Bilateral custom orthotics; Dx: bilateral foot pain  generic DME PT evaluate and treat, strengthening and conditioning dx: right foot and back pain  generic DME PT evaluate and treat, dx: right foot pain  generic DME PT evaluate and treat, dx: low back pain  guaiFENesin (MUCINEX) 1,200 mg, 2 TIMES DAILY  L-Glutamine 500 mg, DAILY  levothyroxine  (SYNTHROID , LEVOTHROID) 25 mcg, Oral, DAILY BEFORE BREAKFAST  loratadine (CLARITIN) 10 mg, DAILY  MethylFolate 1,000 mcg, DAILY  metroNIDAZOLE (METROCREAM) 0.75 % cream 1 Application, DAILY  mirabegron  (MYRBETRIQ ) 25 mg, Oral, DAILY  moxifloxacin  (VIGAMOX ) 0.5 % ophthalmic solution 1 drop, Left Eye, 4 TIMES DAILY  Multiple Vitamin (MULTIVITAMIN) per tablet 1 tablet, DAILY  Non-System Medication Bilateral custom-made orthoticsDx: bilateral foot pain, history of stroke, disp: 1pair  NONFORMULARY, OTHER, ORDER *I* 1 capsule, DAILY  NONFORMULARY, OTHER, ORDER *I* 1 capsule, DAILY  NONFORMULARY, OTHER, ORDER *I* 1 tablet, DAILY  olopatadine (PATADAY) 0.2 % ophthalmic solution 1 drop, Both Eyes, DAILY  polyethylene glycol (GLYCOLAX,MIRALAX) 17 g, DAILY  pseudoephedrine  (SUDAFED) 30 mg tablet TAKE 1 TABLET BY MOUTH EVERY 4 HOURS AS NEEDED FOR CONGESTION  RABEprazole  (ACIPHEX ) 20 mg, Oral, DAILY  simethicone (MYLICON) 80 mg, PRN  solifenacin  (VESICARE ) 10 mg, Oral, DAILY  triamcinolone  (KENALOG ) 0.1 % ointment Topical, 2 TIMES DAILY, to the following areas: ear  zinc sulfate (ZINCATE)  50 mg, DAILY   'Objective Objective  Visual Acuity (Snellen - Linear)     Right Left  Dist cc 20/30 20/30  Correction: Glasses   Pupils     Dark Light Shape React APD  Right 5 2 Round Brisk None  Left 5 2 Round Brisk None    Base Eye Exam   Visual Acuity (Snellen - Linear)     Right Left  Dist cc 20/30 20/30  Correction: Glasses    Tonometry (Tonopen, 12:19 PM)     Right Left  Pressure 9 10    Pupils     Dark Light Shape React APD  Right 5 2 Round Brisk None  Left 5 2 Round Brisk None    Visual Fields (Counting fingers)     Left Right   Full Full    Extraocular Movement     Right Left   Full, Ortho Full, Ortho    Neuro/Psych   Oriented x3: Yes  Mood/Affect: Normal    Slit Lamp and Fundus Exam   External Exam     Right Left  External Normal ocular adnexae, lacrimal gland & drainage, orbits Normal ocular adnexae, lacrimal gland & drainage, orbits    Slit Lamp Exam     Right Left  Lids/Lashes 3+ Dermatochalasis - upper lid 2+ Dermatochalasis - upper lid  Conjunctiva/Sclera Normal bulbar/palpebral, conjunctiva, sclera Normal bulbar/palpebral, conjunctiva, sclera  Cornea Normal epithelium, stroma, endothelium, tear film; well apposed CC ST wound. 3 small 0.5 mm subepithelial infiltrates near limbus supranasally. No frank ulceration  Anterior Chamber Clear & deep Clear & deep  Iris Normal shape, size, morphology Normal shape, size, morphology  Lens Centered posterior chamber intraocular lens, no posterior capsular opacification Centered posterior chamber intraocular lens, no posterior capsular opacification  Anterior Vitreous Clear Clear     Imaging Studies:  Assessment/Plan 1. Marginal keratitis (Primary)2. Allergic conjunctivitis of left eye- OS with 3 small 0.5 mm subepithelial infiltrates near  limbus supranasally, consistent with marginal keratitis. No frank ulceration- OS with papillae on palpebral conj. Patient endorsing itchiness and rubbing; allergic conjunctivitis Plan- Vigamox  QID, L eye- Pataday once daily, both eyes- will consider muro128 if recurring marginal keratitis- Follow up with Dr. Bernardino in 1 week - RTC precautions reviewed: If experiencing new changes in vision, blurry vision, eye pain, sensitivity to light, worse pain with eye movements, and curtains in vision, fixed dark spots in vision, sudden complete vision loss in one eye or if your symptoms do not improve or continue to worsen despite these measures, please feel free to call us  anytime at [phone] Seen by and discussed with supervising attending, attestation to follow -------------------------------------------------------------------------------------------------------------------------------------------------------------------This note was created in part using Dragon dictation software, grammatical errors will invariably arise and have attempted to be eliminated. Reyes Rand, MDOphthalmology PGY-210/15/2025 at 12:20 PM [1] Past Medical History:Diagnosis Date  Asymmetrical Sensorineural Hearing Loss 01/27/2010  AVM (arteriovenous malformation)   supratentorial complex  Contusion of foot 06/02/2013  GERD (gastroesophageal reflux disease) 2012  Limited diet & aciphex   High blood pressure 1995  Hyperlipidemia 2010  Lipitor  Hypertension   Hypothyroidism   Instability of joints of both ankles   poor musculature; needs orthotic shoes  Irritable bowel syndrome 1977  Intermittent, chronic diet controlled  Osteoporosis   Overactive bladder 05/19/2024  Rosacea 10/19/2005  Scoliosis   Seizure   Stroke   Tinnitus   Urge Incontinence Of Urine 10/19/2005 [2] Past Surgical History:Procedure Laterality Date  COLONOSCOPY  Every 10 years   CRANIOTOMY    AVM Repair  HX TONSILLECTOMY/ADENOIDECTOMY    Tonsillectomy Conversion Data   PR XCAPSL CTRC RMVL  INSJ IO LENS PROSTH W/O ECP Left 07/01/2024  Procedure: PHACO W/ IOL;  Surgeon: Oneta Bonnita Lobos, MD;  Location: [facility];  Service: Ophthalmology  PR XCAPSL CTRC RMVL INSJ IO LENS PROSTH W/O ECP Right 07/15/2024  Procedure: PHACO W/ IOL; +/- CAPSULE STAIN;  Surgeon: Oneta Bonnita Lobos, MD;  Location: [facility];  Service: Ophthalmology  TONSILLECTOMY    WRIST FRACTURE SURGERY  [phone]  Reconstruction, plates

## 2024-09-03 NOTE — Patient Instructions (Signed)
-   Vigamox  4 times daily in the Left eye- Pataday once daily in both eyes- Follow up with Dr. Bernardino next week

## 2024-09-10 ENCOUNTER — Encounter: Payer: Self-pay | Admitting: Gastroenterology

## 2024-09-11 ENCOUNTER — Other Ambulatory Visit: Payer: Self-pay

## 2024-09-11 ENCOUNTER — Other Ambulatory Visit: Payer: Self-pay | Admitting: Internal Medicine

## 2024-09-12 ENCOUNTER — Ambulatory Visit: Admitting: Ophthalmology

## 2024-09-12 ENCOUNTER — Encounter: Payer: Self-pay | Admitting: Ophthalmology

## 2024-09-12 DIAGNOSIS — H168 Other keratitis: Secondary | ICD-10-CM

## 2024-09-12 DIAGNOSIS — H01009 Unspecified blepharitis unspecified eye, unspecified eyelid: Secondary | ICD-10-CM

## 2024-09-12 MED ORDER — PREDNISOLONE ACETATE 1 % OP SUSP *I*
1.0000 [drp] | Freq: Four times a day (QID) | OPHTHALMIC | 1 refills | Status: DC
Start: 2024-09-12 — End: 2024-09-26

## 2024-09-12 NOTE — Progress Notes (Signed)
 Outpatient VisitPatient name: [name]: [birth year]       Age: 68 y.o.MR#: [id number] Encounter Date: 09/12/2024 1. Marginal keratitis (Primary)2. Blepharitis, unspecified laterality, unspecified type   1. Marginal keratitis (Primary)2. Blepharitis, unspecified laterality, unspecified typeRecommended the following to the pt: - prednisoLONE  acetate (PRED FORTE ) 1 % ophthalmic suspension; Place 1 drop into the left eye 4 times daily for Cornea Inflammation  Dispense: 5 mL; Refill: 1- Ocusoft eyelid scrub for use on both eyelids prior to bed in the evenings. Use one towelette on both eyes and follow the instructions on the package. - Discontinue the use of the Vigamox .  RTC 1-2 weeks for IOP and corneal check OU.

## 2024-09-12 NOTE — Patient Instructions (Signed)
 Discontinue the use of the Vigamox . Begin use of Prednisolone  eye drop 4x daily in the left eye. Recommend Ocusoft eyelid scrub for use on both eyelids prior to bed in the evenings. Use one towelette on both eyes and follow the instructions on the package.

## 2024-09-17 ENCOUNTER — Ambulatory Visit: Payer: Medicare Other

## 2024-09-17 DIAGNOSIS — I495 Sick sinus syndrome: Secondary | ICD-10-CM

## 2024-09-18 LAB — CUP PACEART REMOTE DEVICE CHECK
Battery Remaining Longevity: 141 mo
Battery Voltage: 3.03 V
Brady Statistic AP VP Percent: 0.04 %
Brady Statistic AP VS Percent: 96.54 %
Brady Statistic AS VP Percent: 0 %
Brady Statistic AS VS Percent: 3.42 %
Brady Statistic RA Percent Paced: 97.08 %
Brady Statistic RV Percent Paced: 0.04 %
Date Time Interrogation Session: 20251028205126
Implantable Lead Connection Status: 753985
Implantable Lead Connection Status: 753985
Implantable Lead Implant Date: 20231101
Implantable Lead Implant Date: 20231101
Implantable Lead Location: 753859
Implantable Lead Location: 753860
Implantable Lead Model: 3830
Implantable Lead Model: 5076
Implantable Pulse Generator Implant Date: 20231101
Lead Channel Impedance Value: 323 Ohm
Lead Channel Impedance Value: 361 Ohm
Lead Channel Impedance Value: 456 Ohm
Lead Channel Impedance Value: 532 Ohm
Lead Channel Pacing Threshold Amplitude: 0.625 V
Lead Channel Pacing Threshold Amplitude: 0.875 V
Lead Channel Pacing Threshold Pulse Width: 0.4 ms
Lead Channel Pacing Threshold Pulse Width: 0.4 ms
Lead Channel Sensing Intrinsic Amplitude: 15.875 mV
Lead Channel Sensing Intrinsic Amplitude: 15.875 mV
Lead Channel Sensing Intrinsic Amplitude: 2.375 mV
Lead Channel Sensing Intrinsic Amplitude: 2.375 mV
Lead Channel Setting Pacing Amplitude: 1.5 V
Lead Channel Setting Pacing Amplitude: 2 V
Lead Channel Setting Pacing Pulse Width: 0.4 ms
Lead Channel Setting Sensing Sensitivity: 1.2 mV
Zone Setting Status: 755011

## 2024-09-23 ENCOUNTER — Encounter: Payer: Self-pay | Admitting: Internal Medicine

## 2024-09-23 NOTE — Progress Notes (Unsigned)
 Subjective:    Patient ID: Ariel Simpson, female    DOB: 14-Jul-1956, 68 y.o.   MRN: [id number]      HPI Ariel Simpson is here for a Physical exam and her chronic medical problems.   Recently noticed a buzzing feeling in left lower lateral chest - ? PPM - sees cardiology soon and will discuss with them.  She denies any chest pain or palpitations.    Medications and allergies revewed with patient and updated if appropriate.  Current Outpatient Medications on File Prior to Visit  Medication Sig Dispense Refill   amLODipine  (NORVASC ) 5 MG tablet Take 1 tablet (5 mg total) by mouth daily. 30 tablet 11   amoxicillin  (AMOXIL ) 500 MG capsule TAKE 4 CAPSULES BY MOUTH 1 HOUR BEFORE DENTAL APPOINTMENT 4 capsule 3   aspirin  81 MG tablet Take 1 tablet (81 mg total) by mouth daily.     atorvastatin  (LIPITOR) 20 MG tablet TAKE 1 TABLET BY MOUTH DAILY 90 tablet 0   BIOTIN PO Place 1 patch onto the skin daily.     Glucosamine Sulfate-MSM (MSM-GLUCOSAMINE EX) Place 1 patch onto the skin daily.     losartan  (COZAAR ) 50 MG tablet Take 1 tablet (50 mg total) by mouth 2 (two) times daily. 180 tablet 3   metoprolol  succinate (TOPROL  XL) 25 MG 24 hr tablet Take 1 tablet (25 mg total) by mouth 2 (two) times daily. 180 tablet 3   Omega-3 Fatty Acids (FISH OIL PO) Take 2 capsules by mouth daily.     polyethylene glycol (MIRALAX  / GLYCOLAX ) packet Take 17 g by mouth daily.     Propylene Glycol, PF, (SYSTANE COMPLETE PF) 0.6 % SOLN Place 1 drop into both eyes as needed (dry eyes).     TURMERIC PO Take 1 tablet by mouth in the morning and at bedtime.     zolpidem  (AMBIEN ) 5 MG tablet TAKE 1/2 TABLET(2.5 MG) BY MOUTH AT BEDTIME AS NEEDED FOR SLEEP 45 tablet 1   furosemide  (LASIX ) 20 MG tablet Take 1 tablet (20 mg total) by mouth daily as needed (shortness of breath or swelling). 90 tablet 1   No current facility-administered medications on file prior to visit.    Review of Systems  Constitutional:  Negative  for fever.  Eyes:  Positive for visual disturbance (occular migraines).  Respiratory:  Negative for cough, shortness of breath and wheezing.   Cardiovascular:  Positive for leg swelling. Negative for chest pain and palpitations.  Gastrointestinal:  Negative for abdominal pain, blood in stool, constipation and diarrhea.       No gerd  Genitourinary:  Negative for dysuria.  Musculoskeletal:  Positive for arthralgias (diffuse). Negative for back pain.  Skin:  Negative for rash.  Neurological:  Positive for tremors. Negative for light-headedness and headaches.  Psychiatric/Behavioral:  Negative for dysphoric mood. The patient is not nervous/anxious.        Objective:   Vitals:   09/24/24 1324  BP: 116/80  Pulse: 71  Temp: 98.1 F (36.7 C)  SpO2: 94%   Filed Weights   09/24/24 1324  Weight: 172 lb (78 kg)   Body mass index is 27.76 kg/m.  BP Readings from Last 3 Encounters:  09/24/24 116/80  07/07/24 120/88  02/19/24 132/84    Wt Readings from Last 3 Encounters:  09/24/24 172 lb (78 kg)  07/07/24 170 lb 9.6 oz (77.4 kg)  02/19/24 165 lb (74.8 kg)       Physical Exam Constitutional: She  appears well-developed and well-nourished. No distress.  HENT:  Head: Normocephalic and atraumatic.  Right Ear: External ear normal. Normal ear canal and TM Left Ear: External ear normal.  Normal ear canal and TM Mouth/Throat: Oropharynx is clear and moist.  Eyes: Conjunctivae normal.  Neck: Neck supple. No tracheal deviation present. No thyromegaly present.  No carotid bruit  Cardiovascular: Normal rate, regular rhythm and normal heart sounds.   2/6 sys No murmur heard.  No edema. Pulmonary/Chest: Effort normal and breath sounds normal. No respiratory distress. She has no wheezes. She has no rales.  Breast: deferred   Abdominal: Soft. She exhibits no distension. There is no tenderness.  Lymphadenopathy: She has no cervical adenopathy.  Skin: Skin is warm and dry. She is not  diaphoretic.  Psychiatric: She has a normal mood and affect. Her behavior is normal.     Lab Results  Component Value Date   WBC 7.1 12/17/2023   HGB 13.5 12/17/2023   HCT 40.3 12/17/2023   PLT 182 12/17/2023   GLUCOSE 83 12/17/2023   CHOL 174 09/17/2023   TRIG 146.0 09/17/2023   HDL 57.70 09/17/2023   LDLCALC 88 09/17/2023   ALT 29 09/17/2023   AST 24 09/17/2023   NA 141 12/17/2023   K 4.8 12/17/2023   CL 102 12/17/2023   CREATININE 0.77 12/17/2023   BUN 11 12/17/2023   CO2 23 12/17/2023   TSH 2.46 09/17/2023   INR 1.29 03/23/2013   HGBA1C 5.6 09/17/2023         Assessment & Plan:   Physical exam: Screening blood work  ordered Exercise  yoga Weight  is good Substance abuse  none   Reviewed recommended immunizations.   Health Maintenance  Topic Date Due   Pneumococcal Vaccine: 50+ Years (1 of 2 - PCV) Never done   Influenza Vaccine  06/20/2024   COVID-19 Vaccine (5 - 2025-26 season) 07/21/2024   Colonoscopy  08/25/2024   Medicare Annual Wellness (AWV)  10/25/2024   DEXA SCAN  03/22/2025   Mammogram  03/28/2026   DTaP/Tdap/Td (3 - Td or Tdap) 01/15/2033   Hepatitis C Screening  Completed   Meningococcal B Vaccine  Aged Out   Zoster Vaccines- Shingrix  Discontinued      Will call to schedule her colonoscopy    See Problem List for Assessment and Plan of chronic medical problems.

## 2024-09-23 NOTE — Patient Instructions (Addendum)
 Colonoscopy -- [facility] Phone: [phone]    Blood work was ordered.       Medications changes include :   None    A referral was ordered OT for your hands and someone will call you to schedule an appointment.     Return in about 1 year (around 09/24/2025) for Physical Exam.    Health Maintenance, Female Adopting a healthy lifestyle and getting preventive care are important in promoting health and wellness. Ask your health care provider about: The right schedule for you to have regular tests and exams. Things you can do on your own to prevent diseases and keep yourself healthy. What should I know about diet, weight, and exercise? Eat a healthy diet  Eat a diet that includes plenty of vegetables, fruits, low-fat dairy products, and lean protein. Do not eat a lot of foods that are high in solid fats, added sugars, or sodium. Maintain a healthy weight Body mass index (BMI) is used to identify weight problems. It estimates body fat based on height and weight. Your health care provider can help determine your BMI and help you achieve or maintain a healthy weight. Get regular exercise Get regular exercise. This is one of the most important things you can do for your health. Most adults should: Exercise for at least 150 minutes each week. The exercise should increase your heart rate and make you sweat (moderate-intensity exercise). Do strengthening exercises at least twice a week. This is in addition to the moderate-intensity exercise. Spend less time sitting. Even light physical activity can be beneficial. Watch cholesterol and blood lipids Have your blood tested for lipids and cholesterol at 70 years of age, then have this test every 5 years. Have your cholesterol levels checked more often if: Your lipid or cholesterol levels are high. You are older than 70 years of age. You are at high risk for heart disease. What should I know about cancer screening? Depending on your  health history and family history, you may need to have cancer screening at various ages. This may include screening for: Breast cancer. Cervical cancer. Colorectal cancer. Skin cancer. Lung cancer. What should I know about heart disease, diabetes, and high blood pressure? Blood pressure and heart disease High blood pressure causes heart disease and increases the risk of stroke. This is more likely to develop in people who have high blood pressure readings or are overweight. Have your blood pressure checked: Every 3-5 years if you are 62-30 years of age. Every year if you are 34 years old or older. Diabetes Have regular diabetes screenings. This checks your fasting blood sugar level. Have the screening done: Once every three years after age 21 if you are at a normal weight and have a low risk for diabetes. More often and at a younger age if you are overweight or have a high risk for diabetes. What should I know about preventing infection? Hepatitis B If you have a higher risk for hepatitis B, you should be screened for this virus. Talk with your health care provider to find out if you are at risk for hepatitis B infection. Hepatitis C Testing is recommended for: Everyone born from [birth year] through 1967. Anyone with known risk factors for hepatitis C. Sexually transmitted infections (STIs) Get screened for STIs, including gonorrhea and chlamydia, if: You are sexually active and are younger than 70 years of age. You are older than 70 years of age and your health care provider tells you that you  are at risk for this type of infection. Your sexual activity has changed since you were last screened, and you are at increased risk for chlamydia or gonorrhea. Ask your health care provider if you are at risk. Ask your health care provider about whether you are at high risk for HIV. Your health care provider may recommend a prescription medicine to help prevent HIV infection. If you choose to take  medicine to prevent HIV, you should first get tested for HIV. You should then be tested every 3 months for as long as you are taking the medicine. Pregnancy If you are about to stop having your period (premenopausal) and you may become pregnant, seek counseling before you get pregnant. Take 400 to 800 micrograms (mcg) of folic acid every day if you become pregnant. Ask for birth control (contraception) if you want to prevent pregnancy. Osteoporosis and menopause Osteoporosis is a disease in which the bones lose minerals and strength with aging. This can result in bone fractures. If you are 46 years old or older, or if you are at risk for osteoporosis and fractures, ask your health care provider if you should: Be screened for bone loss. Take a calcium  or vitamin D supplement to lower your risk of fractures. Be given hormone replacement therapy (HRT) to treat symptoms of menopause. Follow these instructions at home: Alcohol use Do not drink alcohol if: Your health care provider tells you not to drink. You are pregnant, may be pregnant, or are planning to become pregnant. If you drink alcohol: Limit how much you have to: 0-1 drink a day. Know how much alcohol is in your drink. In the [place] one drink equals one 12 oz bottle of beer (355 mL), one 5 oz glass of wine (148 mL), or one 1 oz glass of hard liquor (44 mL). Lifestyle Do not use any products that contain nicotine or tobacco. These products include cigarettes, chewing tobacco, and vaping devices, such as e-cigarettes. If you need help quitting, ask your health care provider. Do not use street drugs. Do not share needles. Ask your health care provider for help if you need support or information about quitting drugs. General instructions Schedule regular health, dental, and eye exams. Stay current with your vaccines. Tell your health care provider if: You often feel depressed. You have ever been abused or do not feel safe at  home. Summary Adopting a healthy lifestyle and getting preventive care are important in promoting health and wellness. Follow your health care provider's instructions about healthy diet, exercising, and getting tested or screened for diseases. Follow your health care provider's instructions on monitoring your cholesterol and blood pressure. This information is not intended to replace advice given to you by your health care provider. Make sure you discuss any questions you have with your health care provider. Document Revised: 03/28/2021 Document Reviewed: 03/28/2021 Elsevier Patient Education  2024 [employer].

## 2024-09-24 ENCOUNTER — Ambulatory Visit (INDEPENDENT_AMBULATORY_CARE_PROVIDER_SITE_OTHER): Admitting: Internal Medicine

## 2024-09-24 VITALS — BP 116/80 | HR 71 | Temp 98.1°F | Ht 66.0 in | Wt 172.0 lb

## 2024-09-24 DIAGNOSIS — E7849 Other hyperlipidemia: Secondary | ICD-10-CM

## 2024-09-24 DIAGNOSIS — M85851 Other specified disorders of bone density and structure, right thigh: Secondary | ICD-10-CM | POA: Diagnosis not present

## 2024-09-24 DIAGNOSIS — G4700 Insomnia, unspecified: Secondary | ICD-10-CM | POA: Diagnosis not present

## 2024-09-24 DIAGNOSIS — Z8679 Personal history of other diseases of the circulatory system: Secondary | ICD-10-CM

## 2024-09-24 DIAGNOSIS — M85852 Other specified disorders of bone density and structure, left thigh: Secondary | ICD-10-CM

## 2024-09-24 DIAGNOSIS — Z0000 Encounter for general adult medical examination without abnormal findings: Secondary | ICD-10-CM

## 2024-09-24 DIAGNOSIS — R739 Hyperglycemia, unspecified: Secondary | ICD-10-CM | POA: Diagnosis not present

## 2024-09-24 DIAGNOSIS — Z95811 Presence of heart assist device: Secondary | ICD-10-CM

## 2024-09-24 DIAGNOSIS — F5104 Psychophysiologic insomnia: Secondary | ICD-10-CM

## 2024-09-24 DIAGNOSIS — R251 Tremor, unspecified: Secondary | ICD-10-CM

## 2024-09-24 LAB — CBC
HCT: 38.2 % (ref 36.0–46.0)
Hemoglobin: 13.2 g/dL (ref 12.0–15.0)
MCHC: 34.5 g/dL (ref 30.0–36.0)
MCV: 90.5 fl (ref 78.0–100.0)
Platelets: 190 K/uL (ref 150.0–400.0)
RBC: 4.22 Mil/uL (ref 3.87–5.11)
RDW: 13 % (ref 11.5–15.5)
WBC: 4.8 K/uL (ref 4.0–10.5)

## 2024-09-24 LAB — LIPID PANEL
Cholesterol: 174 mg/dL (ref 0–200)
HDL: 51.6 mg/dL (ref >39.00)
LDL Cholesterol: 99 mg/dL (ref 0–99)
NonHDL: 122.8
Total CHOL/HDL Ratio: 3
Triglycerides: 121 mg/dL (ref 0.0–149.0)
VLDL: 24.2 mg/dL (ref 0.0–40.0)

## 2024-09-24 LAB — COMPREHENSIVE METABOLIC PANEL WITH GFR
ALT: 21 U/L (ref 0–35)
AST: 23 U/L (ref 0–37)
Albumin: 4.6 g/dL (ref 3.5–5.2)
Alkaline Phosphatase: 72 U/L (ref 39–117)
BUN: 14 mg/dL (ref 6–23)
CO2: 30 meq/L (ref 19–32)
Calcium: 9.6 mg/dL (ref 8.4–10.5)
Chloride: 102 meq/L (ref 96–112)
Creatinine, Ser: 0.81 mg/dL (ref 0.40–1.20)
GFR: 73.35 mL/min (ref >60.00)
Glucose, Bld: 86 mg/dL (ref 70–99)
Potassium: 4.3 meq/L (ref 3.5–5.1)
Sodium: 139 meq/L (ref 135–145)
Total Bilirubin: 0.9 mg/dL (ref 0.2–1.2)
Total Protein: 7 g/dL (ref 6.0–8.3)

## 2024-09-24 LAB — HEMOGLOBIN A1C: Hgb A1c MFr Bld: 5.6 % (ref 4.6–6.5)

## 2024-09-24 LAB — TSH: TSH: 1.78 u[IU]/mL (ref 0.35–5.50)

## 2024-09-24 MED ORDER — ZOLPIDEM TARTRATE 5 MG PO TABS: ORAL_TABLET | ORAL | 1 refills | Status: DC

## 2024-09-24 NOTE — Progress Notes (Signed)
 Remote PPM Transmission

## 2024-09-24 NOTE — Assessment & Plan Note (Addendum)
 Chronic Has seen Dr. Evonnie Currently on metoprolol   Tremor has increased - having difficulty eating Using left hand to eat and do other things Referral to Occupational Therapy to see if they can help her to more with using her left hand

## 2024-09-24 NOTE — Assessment & Plan Note (Signed)
 Chronic Lab Results  Component Value Date   HGBA1C 5.6 09/17/2023   Check A1c Continue regular exercise Low sugar/carbohydrate diet

## 2024-09-24 NOTE — Assessment & Plan Note (Signed)
Chronic Controlled, Stable Continue Ambien 2.5 mg nightly as needed

## 2024-09-24 NOTE — Assessment & Plan Note (Signed)
 Chronic Regular exercise and healthy diet encouraged Check lipid panel, CMP, CBC, TSH Continue atorvastatin  20 mg daily

## 2024-09-24 NOTE — Assessment & Plan Note (Signed)
 History of PSVT and has rare episodes on metoprolol  has helped to avoid these Continue metoprolol  XL 25 mg twice daily

## 2024-09-24 NOTE — Assessment & Plan Note (Addendum)
 Chronic Has PPM - for sinus node dysfunction Following with cardiology

## 2024-09-24 NOTE — Assessment & Plan Note (Signed)
 Chronic Osteopenia with low FRAX DEXA up-to-date Regular exercise stressed Continue calcium  and vitamin D daily Check vitamin D level

## 2024-09-25 ENCOUNTER — Other Ambulatory Visit: Payer: Self-pay

## 2024-09-25 ENCOUNTER — Ambulatory Visit: Payer: Self-pay | Admitting: Internal Medicine

## 2024-09-25 ENCOUNTER — Other Ambulatory Visit: Payer: Self-pay | Admitting: Medical Genetics

## 2024-09-25 LAB — VITAMIN D 25 HYDROXY (VIT D DEFICIENCY, FRACTURES): VITD: 28.39 ng/mL — ABNORMAL LOW (ref 30.00–100.00)

## 2024-09-26 ENCOUNTER — Ambulatory Visit: Admitting: Ophthalmology

## 2024-09-26 ENCOUNTER — Encounter: Payer: Self-pay | Admitting: Ophthalmology

## 2024-09-26 ENCOUNTER — Other Ambulatory Visit: Payer: Self-pay

## 2024-09-26 ENCOUNTER — Ambulatory Visit: Payer: Self-pay | Admitting: Internal Medicine

## 2024-09-26 DIAGNOSIS — H168 Other keratitis: Secondary | ICD-10-CM

## 2024-09-26 DIAGNOSIS — H01002 Unspecified blepharitis right lower eyelid: Secondary | ICD-10-CM

## 2024-09-26 DIAGNOSIS — H01009 Unspecified blepharitis unspecified eye, unspecified eyelid: Secondary | ICD-10-CM

## 2024-09-26 MED ORDER — PREDNISOLONE ACETATE 1 % OP SUSP *I*
OPHTHALMIC | 0 refills | Status: AC
Start: 2024-09-26 — End: 2024-10-02

## 2024-09-26 NOTE — Progress Notes (Signed)
 Outpatient VisitPatient name: [name]: 05-19-1956       Age: 68 y.o.MR#: [id number] Encounter Date: 09/26/2024 1. Marginal keratitis (Primary)2. Blepharitis, unspecified laterality, unspecified typeVasty improved OS. Pt reports good compliance with the use of Pred Forte  eye drop and lid hygiene.    1. Marginal keratitis (Primary)2. Blepharitis, unspecified laterality, unspecified typeRecommended the following to the pt: Begin using the Steroid eye drop, 1 drop 3x daily in the left eye for 3 days, THEN 1 drop in the left eye 2x daily for 2 days, and THEN 1 drop in the left eye 1x daily for 1 day. And THEN STOP.Continue the eyelid scrubs as needed - discussed slowly stopping use vs. Stopping all the sudden.  RTC for exam with Dr. Bernardino as already scheduled.

## 2024-09-26 NOTE — Patient Instructions (Signed)
 Begin using the Steroid eye drop, 1 drop 3x daily in the left eye for 3 days, THEN 1 drop in the left eye 2x daily for 2 days, and THEN 1 drop in the left eye 1x daily for 1 day. And THEN STOP.Continue the eyelid scrubs as needed.

## 2024-09-29 ENCOUNTER — Ambulatory Visit: Attending: Internal Medicine | Admitting: Internal Medicine

## 2024-09-29 ENCOUNTER — Encounter: Payer: Self-pay | Admitting: Internal Medicine

## 2024-09-29 VITALS — BP 92/62 | HR 74 | Ht 65.5 in | Wt 166.0 lb

## 2024-09-29 DIAGNOSIS — I495 Sick sinus syndrome: Secondary | ICD-10-CM

## 2024-09-29 DIAGNOSIS — F5104 Psychophysiologic insomnia: Secondary | ICD-10-CM

## 2024-09-29 LAB — CUP PACEART INCLINIC DEVICE CHECK
Date Time Interrogation Session: 20251110092419
Implantable Lead Connection Status: 753985
Implantable Lead Connection Status: 753985
Implantable Lead Implant Date: 20231101
Implantable Lead Implant Date: 20231101
Implantable Lead Location: 753859
Implantable Lead Location: 753860
Implantable Lead Model: 3830
Implantable Lead Model: 5076
Implantable Pulse Generator Implant Date: 20231101

## 2024-09-29 NOTE — Progress Notes (Signed)
 HPI Ms. Nease returns today for followup. She is a pleasant 68 yo woman with a h/o unexplained syncope as well as prior mitral valve repair/MAZE and autonomic dysfunction. She was found to have sinus node dysfunction and has undergone PPM. She notes that since her PM was placed, she is able to go up stairs and hills better with less difficulty. No additional syncope. Pacemaker monitoring has not shown any atrial fib s/p MAZE. She c/o a vibratory sensation just below her PPM and down and under her left chest. Comes and goes and no relation to position or movement or activity.  No Known Allergies   Current Outpatient Medications  Medication Sig Dispense Refill   amLODipine  (NORVASC ) 5 MG tablet Take 1 tablet (5 mg total) by mouth daily. 30 tablet 11   amoxicillin  (AMOXIL ) 500 MG capsule TAKE 4 CAPSULES BY MOUTH 1 HOUR BEFORE DENTAL APPOINTMENT 4 capsule 3   aspirin  81 MG tablet Take 1 tablet (81 mg total) by mouth daily.     atorvastatin  (LIPITOR) 20 MG tablet TAKE 1 TABLET BY MOUTH DAILY 90 tablet 0   furosemide  (LASIX ) 20 MG tablet Take 1 tablet (20 mg total) by mouth daily as needed (shortness of breath or swelling). 90 tablet 1   losartan  (COZAAR ) 50 MG tablet Take 1 tablet (50 mg total) by mouth 2 (two) times daily. 180 tablet 3   metoprolol  succinate (TOPROL  XL) 25 MG 24 hr tablet Take 1 tablet (25 mg total) by mouth 2 (two) times daily. 180 tablet 3   Omega-3 Fatty Acids (FISH OIL PO) Take 2 capsules by mouth daily.     polyethylene glycol (MIRALAX  / GLYCOLAX ) packet Take 17 g by mouth daily.     Propylene Glycol, PF, (SYSTANE COMPLETE PF) 0.6 % SOLN Place 1 drop into both eyes as needed (dry eyes).     TURMERIC PO Take 1 tablet by mouth in the morning and at bedtime.     zolpidem  (AMBIEN ) 5 MG tablet TAKE 1/2 TABLET(2.5 MG) BY MOUTH AT BEDTIME AS NEEDED FOR SLEEP 45 tablet 1   BIOTIN PO Place 1 patch onto the skin daily.     Glucosamine Sulfate-MSM (MSM-GLUCOSAMINE EX) Place 1  patch onto the skin daily.     No current facility-administered medications for this visit.     Past Medical History:  Diagnosis Date   Arthritis    Benign essential tremor    CHF (congestive heart failure) (HCC)    Heart murmur    High cholesterol    Hypertension    Insomnia    Mitral regurgitation    Mitral valve prolapse    Osteopenia    Paroxysmal atrial fibrillation (HCC) 03/11/2013   Paroxysmal supraventricular tachycardia    PONV (postoperative nausea and vomiting)    Dizziness and Nausea   S/P Maze operation for atrial fibrillation 03/18/2013   Complete bilateral atrial lesion set using cryothermy and biopolar radiofrequency ablation with oversewing of LA appendage via right mini thoracotomy   S/P mitral valve repair 03/18/2013   Complex valvuloplasty including triangular resection of posterior leaflet, artificial Gore-tex neocord placement x4 and 30mm Sorin Memo 3D ring annuloplasty via right mini thoracotomy   Severe mitral regurgitation 03/03/2013   Acute on chronic with acute heart failure    Shortness of breath    Wears glasses     ROS:   All systems reviewed and negative except as noted in the HPI.   Past Surgical History:  Procedure Laterality Date   ABDOMINAL HYSTERECTOMY     hernia repair   BIOPSY  08/26/2019   Procedure: BIOPSY;  Surgeon: Dianna Specking, MD;  Location: [facility];  Service: Endoscopy;;   CARDIAC CATHETERIZATION     COLONOSCOPY     COLONOSCOPY WITH PROPOFOL  N/A 08/26/2019   Procedure: COLONOSCOPY WITH PROPOFOL ;  Surgeon: Dianna Specking, MD;  Location: [facility];  Service: Endoscopy;  Laterality: N/A;   INTRAOPERATIVE TRANSESOPHAGEAL ECHOCARDIOGRAM N/A 03/18/2013   Procedure: INTRAOPERATIVE TRANSESOPHAGEAL ECHOCARDIOGRAM;  Surgeon: Sudie VEAR Laine, MD;  Location[facility] OR;  Service: Open Heart Surgery;  Laterality: N/A;   LEFT AND RIGHT HEART CATHETERIZATION WITH CORONARY ANGIOGRAM N/A 03/06/2013   Procedure: LEFT AND RIGHT HEART  CATHETERIZATION WITH CORONARY ANGIOGRAM;  Surgeon: Victory LELON Claudene DOUGLAS, MD;  Location: [facility] CATH LAB;  Service: Cardiovascular;  Laterality: N/A;   LOOP RECORDER REMOVAL N/A 09/20/2022   Procedure: LOOP RECORDER REMOVAL;  Surgeon: Waddell Danelle LELON, MD;  Location: [facility] INVASIVE CV LAB;  Service: Cardiovascular;  Laterality: N/A;   MAZE N/A 03/18/2013   Procedure: MAZE;  Surgeon: Sudie VEAR Laine, MD;  Location: [facility] OR;  Service: Open Heart Surgery;  Laterality: N/A;   MITRAL VALVE REPAIR Right 03/18/2013   Procedure: MINIMALLY INVASIVE MITRAL VALVE REPAIR (MVR);  Surgeon: Sudie VEAR Laine, MD;  Location: [facility] OR;  Service: Open Heart Surgery;  Laterality: Right;   PACEMAKER IMPLANT N/A 09/20/2022   Procedure: PACEMAKER IMPLANT;  Surgeon: Waddell Danelle LELON, MD;  Location: [facility] INVASIVE CV LAB;  Service: Cardiovascular;  Laterality: N/A;   TRANSESOPHAGEAL ECHOCARDIOGRAM (CATH LAB) N/A 12/25/2023   Procedure: TRANSESOPHAGEAL ECHOCARDIOGRAM;  Surgeon: Loni Soyla LABOR, MD;  Location: [facility] INVASIVE CV LAB;  Service: Cardiovascular;  Laterality: N/A;   VARICOSE VEIN SURGERY     WISDOM TOOTH EXTRACTION       Family History  Problem Relation Age of Onset   Pancreatic cancer Mother    Stomach cancer Father    Hypertension Father    Thyroid  cancer Sister    Colon cancer Paternal Grandmother    Stomach cancer Paternal Grandfather    Seizures Child    Down syndrome Child      Social History   Socioeconomic History   Marital status: Married    Spouse name: Ubaldo   Number of children: 3   Years of education: BS   Highest education level: Professional school degree (e.g., MD, DDS, DVM, JD)  Occupational History   Occupation: Retired    Associate Professor: [employer]  Tobacco Use   Smoking status: Never   Smokeless tobacco: Never  Vaping Use   Vaping status: Never Used  Substance and Sexual Activity   Alcohol use: Yes    Alcohol/week: 5.0 - 6.0 standard drinks of alcohol    Types: 5 - 6 Glasses of wine  per week    Comment: 1 drink/day   Drug use: No   Sexual activity: Yes    Birth control/protection: Surgical  Other Topics Concern   Not on file  Social History Narrative   Patient lives at home with her husband  Fidela)  - second husband. Patient has college education B.S. 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   Right handed.   Caffeine- one cup daily.   Social Drivers of Corporate Investment Banker Strain: Low Risk  (09/01/2024)   Overall Financial Resource Strain (CARDIA)    Difficulty of Paying Living Expenses: Not hard at all  Food Insecurity:  No Food Insecurity (09/01/2024)   Hunger Vital Sign    Worried About Running Out of Food in the Last Year: Never true    Ran Out of Food in the Last Year: Never true  Transportation Needs: No Transportation Needs (09/01/2024)   PRAPARE - Administrator, Civil Service (Medical): No    Lack of Transportation (Non-Medical): No  Physical Activity: Sufficiently Active (09/01/2024)   Exercise Vital Sign    Days of Exercise per Week: 5 days    Minutes of Exercise per Session: 60 min  Stress: No Stress Concern Present (09/01/2024)   [employer] of Occupational Health - Occupational Stress Questionnaire    Feeling of Stress: Only a little  Social Connections: Moderately Integrated (09/01/2024)   Social Connection and Isolation Panel    Frequency of Communication with Friends and Family: Twice a week    Frequency of Social Gatherings with Friends and Family: Once a week    Attends Religious Services: Never    Database Administrator or Organizations: Yes    Attends Engineer, Structural: More than 4 times per year    Marital Status: Married  Catering Manager Violence: Not At Risk (10/26/2023)   Humiliation, Afraid, Rape, and Kick questionnaire    Fear of Current or Ex-Partner: No    Emotionally Abused: No    Physically Abused: No    Sexually Abused: No     BP 92/62   Pulse 74   Ht 5' 5.5 (1.664 m)    Wt 166 lb (75.3 kg)   SpO2 96%   BMI 27.20 kg/m   Physical Exam:  Well appearing NAD HEENT: Unremarkable Neck:  No JVD, no thyromegally Lymphatics:  No adenopathy Back:  No CVA tenderness Lungs:  Clear with no wheezes HEART:  Regular rate rhythm, no murmurs, no rubs, no clicks Abd:  soft, positive bowel sounds, no organomegally, no rebound, no guarding Ext:  2 plus pulses, no edema, no cyanosis, no clubbing Skin:  No rashes no nodules Neuro:  CN II through XII intact, motor grossly intact  DEVICE  Normal device function.  See PaceArt for details.   Assess/Plan:  1. Unexplained syncope - she has undergone insertion of a Medtronic ILR. She had syncope with a 4 second daytime pause. She is s/p DDD PM insertion and has not had more syncope. She feels well.  2. PAF - we will be able to monitor this as she is not on systemic anti-coagulation. She has not had any known arrhythmias since her MAZE.  3. SVT - she has not had any SVT since her PPM insertion. 4. Arm swelling - resolved.  5. HTN - her bp is low today. 100/60 by my exam and we discussed decreasing or stopping the amlodipine . At home it is in the 110-120 range so will hold off for now. She may need a reduction in her dose of amlodipine .    Danelle Waddell COME

## 2024-09-29 NOTE — Patient Instructions (Signed)

## 2024-09-30 MED ORDER — ZOLPIDEM TARTRATE 5 MG PO TABS
ORAL_TABLET | ORAL | 1 refills | Status: AC
Start: 2024-09-30 — End: ?

## 2024-10-01 ENCOUNTER — Other Ambulatory Visit: Payer: Self-pay | Admitting: Primary Care

## 2024-10-01 DIAGNOSIS — E785 Hyperlipidemia, unspecified: Secondary | ICD-10-CM

## 2024-10-06 ENCOUNTER — Encounter: Payer: Self-pay | Admitting: Internal Medicine

## 2024-10-06 DIAGNOSIS — I10 Essential (primary) hypertension: Secondary | ICD-10-CM

## 2024-10-06 DIAGNOSIS — E785 Hyperlipidemia, unspecified: Secondary | ICD-10-CM

## 2024-10-06 DIAGNOSIS — Z9889 Other specified postprocedural states: Secondary | ICD-10-CM

## 2024-10-06 DIAGNOSIS — I495 Sick sinus syndrome: Secondary | ICD-10-CM

## 2024-10-06 DIAGNOSIS — Z79899 Other long term (current) drug therapy: Secondary | ICD-10-CM

## 2024-10-06 DIAGNOSIS — I480 Paroxysmal atrial fibrillation: Secondary | ICD-10-CM

## 2024-10-06 MED ORDER — LOSARTAN POTASSIUM 50 MG PO TABS: 50.0000 mg | ORAL_TABLET | Freq: Two times a day (BID) | ORAL | 2 refills | Status: AC

## 2024-10-08 ENCOUNTER — Other Ambulatory Visit: Payer: Self-pay

## 2024-10-08 ENCOUNTER — Ambulatory Visit: Attending: Internal Medicine

## 2024-10-08 DIAGNOSIS — R29898 Other symptoms and signs involving the musculoskeletal system: Secondary | ICD-10-CM | POA: Diagnosis present

## 2024-10-08 DIAGNOSIS — R251 Tremor, unspecified: Secondary | ICD-10-CM | POA: Diagnosis present

## 2024-10-08 DIAGNOSIS — M6281 Muscle weakness (generalized): Secondary | ICD-10-CM | POA: Diagnosis present

## 2024-10-08 DIAGNOSIS — R278 Other lack of coordination: Secondary | ICD-10-CM | POA: Insufficient documentation

## 2024-10-08 NOTE — Therapy (Addendum)
 OUTPATIENT OCCUPATIONAL THERAPY NEURO EVALUATION  Patient Name: Ariel Simpson MRN: [id number] DOB:[birth year], 68 y.o., female Today's Date: 10/13/2024  PCP: Geofm Glade PARAS, MD REFERRING PROVIDER: Geofm Glade PARAS, MD  END OF SESSION:   10/08/24 1616  OT Visits / Re-Eval  Visit Number 1  Number of Visits 13 (including eval)  Date for Recertification  11/21/24  Authorization  Authorization Type [employer] MCR-auth required  OT Time Calculation  OT Start Time 0847  OT Stop Time 0930  OT Time Calculation (min) 43 min  End of Session  Activity Tolerance Patient tolerated treatment well  Behavior During Therapy WFL for tasks assessed/performed    Past Medical History:  Diagnosis Date   Arthritis    Benign essential tremor    CHF (congestive heart failure) (HCC)    Heart murmur    High cholesterol    Hypertension    Insomnia    Mitral regurgitation    Mitral valve prolapse    Osteopenia    Paroxysmal atrial fibrillation (HCC) 03/11/2013   Paroxysmal supraventricular tachycardia    PONV (postoperative nausea and vomiting)    Dizziness and Nausea   S/P Maze operation for atrial fibrillation 03/18/2013   Complete bilateral atrial lesion set using cryothermy and biopolar radiofrequency ablation with oversewing of LA appendage via right mini thoracotomy   S/P mitral valve repair 03/18/2013   Complex valvuloplasty including triangular resection of posterior leaflet, artificial Gore-tex neocord placement x4 and 30mm Sorin Memo 3D ring annuloplasty via right mini thoracotomy   Severe mitral regurgitation 03/03/2013   Acute on chronic with acute heart failure    Shortness of breath    Wears glasses    Past Surgical History:  Procedure Laterality Date   ABDOMINAL HYSTERECTOMY     hernia repair   BIOPSY  08/26/2019   Procedure: BIOPSY;  Surgeon: Dianna Specking, MD;  Location: [facility];  Service: Endoscopy;;   CARDIAC CATHETERIZATION     COLONOSCOPY     COLONOSCOPY WITH  PROPOFOL  N/A 08/26/2019   Procedure: COLONOSCOPY WITH PROPOFOL ;  Surgeon: Dianna Specking, MD;  Location: [facility];  Service: Endoscopy;  Laterality: N/A;   INTRAOPERATIVE TRANSESOPHAGEAL ECHOCARDIOGRAM N/A 03/18/2013   Procedure: INTRAOPERATIVE TRANSESOPHAGEAL ECHOCARDIOGRAM;  Surgeon: Sudie VEAR Laine, MD;  Location: [facility] OR;  Service: Open Heart Surgery;  Laterality: N/A;   LEFT AND RIGHT HEART CATHETERIZATION WITH CORONARY ANGIOGRAM N/A 03/06/2013   Procedure: LEFT AND RIGHT HEART CATHETERIZATION WITH CORONARY ANGIOGRAM;  Surgeon: Victory LELON Claudene DOUGLAS, MD;  Location: [facility] CATH LAB;  Service: Cardiovascular;  Laterality: N/A;   LOOP RECORDER REMOVAL N/A 09/20/2022   Procedure: LOOP RECORDER REMOVAL;  Surgeon: Waddell Danelle LELON, MD;  Location: [facility] INVASIVE CV LAB;  Service: Cardiovascular;  Laterality: N/A;   MAZE N/A 03/18/2013   Procedure: MAZE;  Surgeon: Sudie VEAR Laine, MD;  Location: [facility] OR;  Service: Open Heart Surgery;  Laterality: N/A;   MITRAL VALVE REPAIR Right 03/18/2013   Procedure: MINIMALLY INVASIVE MITRAL VALVE REPAIR (MVR);  Surgeon: Sudie VEAR Laine, MD;  Location: [facility] OR;  Service: Open Heart Surgery;  Laterality: Right;   PACEMAKER IMPLANT N/A 09/20/2022   Procedure: PACEMAKER IMPLANT;  Surgeon: Waddell Danelle LELON, MD;  Location: [facility] INVASIVE CV LAB;  Service: Cardiovascular;  Laterality: N/A;   TRANSESOPHAGEAL ECHOCARDIOGRAM (CATH LAB) N/A 12/25/2023   Procedure: TRANSESOPHAGEAL ECHOCARDIOGRAM;  Surgeon: Loni Soyla LABOR, MD;  Location: [facility] INVASIVE CV LAB;  Service: Cardiovascular;  Laterality: N/A;   VARICOSE VEIN SURGERY  WISDOM TOOTH EXTRACTION     Patient Active Problem List   Diagnosis Date Noted   Presence of heart assist device (HCC) 09/24/2024   TIA (transient ischemic attack) 06/01/2023   Pacemaker 01/18/2023   Dizziness 01/13/2021   Syncope 01/13/2021   Hypotension 09/06/2020   History of colonic polyps 08/26/2019   Hyperglycemia 08/21/2017   Osteopenia 09/23/2016   Need  for prophylactic antibiotic 09/23/2016   Tremor, benign 08/08/2016   Hyperlipidemia 08/08/2016   Insomnia 08/08/2016   History of cardiac catheterization 07/18/2016   SVT (supraventricular tachycardia) 07/18/2016   S/P mitral valve repair 03/18/2013   S/P Maze operation for atrial fibrillation 03/18/2013   Paroxysmal atrial fibrillation (HCC) 03/11/2013   History of PSVT (paroxysmal supraventricular tachycardia) 03/03/2013    Class: Chronic    ONSET DATE: 09/24/2024 referral date  REFERRING DIAG:   R25.1 (ICD-10-CM) - Tremor Per referring PCP: Reason for Referral:essential tremor - difficulty doing some ADLs THERAPY DIAG:  Other lack of coordination - Plan: Ot plan of care cert/re-cert  Other symptoms and signs involving the musculoskeletal system - Plan: Ot plan of care cert/re-cert  Tremor of right hand - Plan: Ot plan of care cert/re-cert  Tremor of left hand - Plan: Ot plan of care cert/re-cert  Rationale for Evaluation and Treatment: Rehabilitation  SUBJECTIVE:   SUBJECTIVE STATEMENT: I've had tremors for years, but it's recently gotten worse. I have them in both, but I came to you for the right. Pt accompanied by: self  PERTINENT HISTORY: TIA, dizziness, HLD, chronic PSVT, syncope  PRECAUTIONS: ICD/Pacemaker, fall risk  WEIGHT BEARING RESTRICTIONS: No  PAIN:  Are you having pain? No  FALLS: Has patient fallen in last 6 months? No  LIVING ENVIRONMENT: Lives with: lives with their spouse Lives in: House/apartment 2 level including basement Stairs: Yes: Internal: 13 steps; can reach both and External: 3 steps; can reach both Has following equipment at home: Grab bars  PLOF: Independent  PATIENT GOALS: Reduce tremors in R hand, be able to scoop coffee into filter without spilling  OBJECTIVE:  Note: Objective measures were completed at Evaluation unless otherwise noted.  HAND DOMINANCE: Right  ADLs: Overall ADLs: Independent, uses L hand  sometimes Transfers/ambulation related to ADLs: Eating: Unable to eat soup or anything involving scooping, has been using L hand  Grooming: Difficulty with plucking eyebrows with tweezers UB Dressing: Independent LB Dressing: Independent Toileting: I Bathing: I Tub Shower transfers: I Equipment: Grab bars  IADLs: Shopping: I Light housekeeping: I Meal Prep: Can complete but difficulty d/t using L hand and tremors in R hand Community mobility: I Medication management: I Financial management: I Handwriting: 100% legible and some shakiness   MOBILITY STATUS: Independent  POSTURE COMMENTS:  No Significant postural limitations   ACTIVITY TOLERANCE: Activity tolerance: Not impacted   FUNCTIONAL OUTCOME MEASURES: Upper Extremity Functional Scale (UEFS): 63/80 PSFS:  UPPER EXTREMITY ROM:    Active ROM Right eval Left eval  Shoulder flexion    Shoulder abduction    Shoulder adduction    Shoulder extension    Shoulder internal rotation    Shoulder external rotation    Elbow flexion    Elbow extension    Wrist flexion    Wrist extension    Wrist ulnar deviation    Wrist radial deviation    Wrist pronation    Wrist supination    (Blank rows = not tested)  UPPER EXTREMITY MMT:     MMT Right eval Left eval  Shoulder  flexion    Shoulder abduction    Shoulder adduction    Shoulder extension    Shoulder internal rotation    Shoulder external rotation    Middle trapezius    Lower trapezius    Elbow flexion    Elbow extension    Wrist flexion    Wrist extension    Wrist ulnar deviation    Wrist radial deviation    Wrist pronation    Wrist supination    (Blank rows = not tested)  HAND FUNCTION: Grip strength: Right: 48 lbs; Left: 47.67 lbs Right: 55, 47, 47 Left: 50, 46, 47 COORDINATION: 9 Hole Peg test: Right: 36.86 sec; Left: 27.89 sec Box and Blocks:  Right 47blocks, Left 50blocks Tremors: action, Right, and Left (worse in Right) PPT 2 (scooping  beans R hand): 45.58 seconds  PPT (doing/undoing buttons): 19.04 seconds SENSATION: WFL  EDEMA: none   COGNITION: Overall cognitive status: Within functional limits for tasks assessed  VISION: Subjective report: No concerns, visits eye doctor every year  Baseline vision: Wears glasses all the time Visual history: none  VISION ASSESSMENT: WFL  Patient has difficulty with following activities due to following visual impairments: none  PERCEPTION: WFL  PRAXIS: WFL  OBSERVATIONS: Tremors in B hands, worse in R hand. This is affecting FM coordination which impairs ability to complete ADLs/IADLs and ability to go out to eat with family and friends                                                                                                                             TREATMENT DATE: 10/08/24  OT educated in purpose of OT, goals, and POC. Pt asked if the purpose of tx would be to improve R hand or become accustomed to using L hand. Educated in [place] of AE that could make use of R hand easier, pt expressed interest in this.   Also educated pt in [place] bracelet and encouraged to take a look at it, pt agreed.       PATIENT EDUCATION: Education details: SEE ABOVE Person educated: Patient Education method: Explanation Education comprehension: verbalized understanding  HOME EXERCISE PROGRAM:    GOALS: Goals reviewed with patient? Yes  SHORT TERM GOALS: Target date: 10/31/24  Pt will be independent with tremor mgmt strategies Baseline: New to OP OT Goal status: INITIAL  2.  Pt will be independent with HEPs for improved coordination with R hand  Baseline: New to OP OT Goal status: INITIAL  3.  Pt will be educated in AE for improved completion of ADL/IADL such as grooming and self feeding Baseline: new to OP OT Goal status: INITIAL  4.  Pt will be able to place at least 51 blocks using right hand with completion of Box and Blocks test.  Baseline: Right  47blocks, Left 50 blocks Goal status: INITIAL   LONG TERM GOALS: Target date: 11/21/24  Pt will demonstrate improved function by increased UEFS score  to 70/80 Baseline: 63/80 Goal status: INITIAL  2.  Pt will demonstrate improved function by a PSFS average score increase of at least 2 points, or an individual score increase of at least 3 points Baseline:  Goal status: INITIAL  3.  Pt will demonstrate improved ease with feeding as evidenced by decreasing PPT#2 by at least 6 secs  Baseline: 45.58 seconds  Goal status: INITIAL  4.  Patient will demo improved FM coordination as evidenced by completing nine-hole peg with use of R hand in 32 seconds or less.  Baseline: Right: 36.86 sec; Left: 27.89 sec Goal status: INITIAL   ASSESSMENT:  CLINICAL IMPRESSION: Patient is a 68 y.o. female who was seen today for occupational therapy evaluation for Tremor in R hand. Hx includes pacemaker, CHF, paroxysmal afib, paroxysmal supraventricular tachycardia, arthritis. Patient currently presents below baseline level of functioning demonstrating functional deficits and impairments as noted below. Pt would benefit from skilled OT services in the outpatient setting to work on impairments as noted below to help pt return to PLOF as able.     PERFORMANCE DEFICITS: in functional skills including ADLs, IADLs, coordination, dexterity, Fine motor control, and UE functional use and psychosocial skills including coping strategies, environmental adaptation, interpersonal interactions, and routines and behaviors.   IMPAIRMENTS: are limiting patient from ADLs, IADLs, leisure, and social participation.   CO-MORBIDITIES: may have co-morbidities  that affects occupational performance. Patient will benefit from skilled OT to address above impairments and improve overall function.  MODIFICATION OR ASSISTANCE TO COMPLETE EVALUATION: No modification of tasks or assist necessary to complete an evaluation.  OT OCCUPATIONAL  PROFILE AND HISTORY: Problem focused assessment: Including review of records relating to presenting problem.  CLINICAL DECISION MAKING: LOW - limited treatment options, no task modification necessary  REHAB POTENTIAL: Good  EVALUATION COMPLEXITY: Low    PLAN:  OT FREQUENCY: 2x/week  OT DURATION: 6 weeks  PLANNED INTERVENTIONS: 97168 OT Re-evaluation, 97535 self care/ADL training, 02889 therapeutic exercise, 97530 therapeutic activity, passive range of motion, and DME and/or AE instructions  RECOMMENDED OTHER SERVICES: none  CONSULTED AND AGREED WITH PLAN OF CARE: Patient  PLAN FOR NEXT SESSION: Issue coordination HEP  F/u [place] bracelet AE education Tremor mgmt compensatory strategy education    [place], [place] 10/13/2024, 4:10 PM

## 2024-10-08 NOTE — Patient Instructions (Signed)
pd

## 2024-10-15 ENCOUNTER — Ambulatory Visit

## 2024-10-15 DIAGNOSIS — R278 Other lack of coordination: Secondary | ICD-10-CM

## 2024-10-15 DIAGNOSIS — M6281 Muscle weakness (generalized): Secondary | ICD-10-CM

## 2024-10-15 DIAGNOSIS — R251 Tremor, unspecified: Secondary | ICD-10-CM

## 2024-10-15 NOTE — Therapy (Signed)
 OUTPATIENT OCCUPATIONAL THERAPY NEURO TREATMENT  Patient Name: Ariel Simpson MRN: [id number] DOB:Jun 13, 1956, 68 y.o., female Today's Date: 10/15/2024  PCP: Geofm Glade PARAS, MD REFERRING PROVIDER: Geofm Glade PARAS, MD  END OF SESSION:  OT End of Session - 10/15/24 0803     Visit Number 2    Number of Visits 13    Date for Recertification  11/21/24    Authorization Type [employer] [facility]    Authorization Time Period 10/08/24-11/19/24    Authorization - Visit Number 2    Authorization - Number of Visits 13    Progress Note Due on Visit 10    OT Start Time 0802    OT Stop Time 0841    OT Time Calculation (min) 39 min    Activity Tolerance Patient tolerated treatment well    Behavior During Therapy WFL for tasks assessed/performed           Past Medical History:  Diagnosis Date   Arthritis    Benign essential tremor    CHF (congestive heart failure) (HCC)    Heart murmur    High cholesterol    Hypertension    Insomnia    Mitral regurgitation    Mitral valve prolapse    Osteopenia    Paroxysmal atrial fibrillation (HCC) 03/11/2013   Paroxysmal supraventricular tachycardia    PONV (postoperative nausea and vomiting)    Dizziness and Nausea   S/P Maze operation for atrial fibrillation 03/18/2013   Complete bilateral atrial lesion set using cryothermy and biopolar radiofrequency ablation with oversewing of LA appendage via right mini thoracotomy   S/P mitral valve repair 03/18/2013   Complex valvuloplasty including triangular resection of posterior leaflet, artificial Gore-tex neocord placement x4 and 30mm Sorin Memo 3D ring annuloplasty via right mini thoracotomy   Severe mitral regurgitation 03/03/2013   Acute on chronic with acute heart failure    Shortness of breath    Wears glasses    Past Surgical History:  Procedure Laterality Date   ABDOMINAL HYSTERECTOMY     hernia repair   BIOPSY  08/26/2019   Procedure: BIOPSY;  Surgeon: Dianna Specking, MD;  Location: [facility];   Service: Endoscopy;;   CARDIAC CATHETERIZATION     COLONOSCOPY     COLONOSCOPY WITH PROPOFOL  N/A 08/26/2019   Procedure: COLONOSCOPY WITH PROPOFOL ;  Surgeon: Dianna Specking, MD;  Location: [facility];  Service: Endoscopy;  Laterality: N/A;   INTRAOPERATIVE TRANSESOPHAGEAL ECHOCARDIOGRAM N/A 03/18/2013   Procedure: INTRAOPERATIVE TRANSESOPHAGEAL ECHOCARDIOGRAM;  Surgeon: Sudie VEAR Laine, MD;  Location: [facility] OR;  Service: Open Heart Surgery;  Laterality: N/A;   LEFT AND RIGHT HEART CATHETERIZATION WITH CORONARY ANGIOGRAM N/A 03/06/2013   Procedure: LEFT AND RIGHT HEART CATHETERIZATION WITH CORONARY ANGIOGRAM;  Surgeon: Victory LELON Claudene DOUGLAS, MD;  Location: [facility] CATH LAB;  Service: Cardiovascular;  Laterality: N/A;   LOOP RECORDER REMOVAL N/A 09/20/2022   Procedure: LOOP RECORDER REMOVAL;  Surgeon: Waddell Danelle LELON, MD;  Location: [facility] INVASIVE CV LAB;  Service: Cardiovascular;  Laterality: N/A;   MAZE N/A 03/18/2013   Procedure: MAZE;  Surgeon: Sudie VEAR Laine, MD;  Location: [facility] OR;  Service: Open Heart Surgery;  Laterality: N/A;   MITRAL VALVE REPAIR Right 03/18/2013   Procedure: MINIMALLY INVASIVE MITRAL VALVE REPAIR (MVR);  Surgeon: Sudie VEAR Laine, MD;  Location: [facility] OR;  Service: Open Heart Surgery;  Laterality: Right;   PACEMAKER IMPLANT N/A 09/20/2022   Procedure: PACEMAKER IMPLANT;  Surgeon: Waddell Danelle LELON, MD;  Location: [facility] INVASIVE CV LAB;  Service: Cardiovascular;  Laterality: N/A;   TRANSESOPHAGEAL ECHOCARDIOGRAM (CATH LAB) N/A 12/25/2023   Procedure: TRANSESOPHAGEAL ECHOCARDIOGRAM;  Surgeon: Loni Soyla LABOR, MD;  Location: [facility] INVASIVE CV LAB;  Service: Cardiovascular;  Laterality: N/A;   VARICOSE VEIN SURGERY     WISDOM TOOTH EXTRACTION     Patient Active Problem List   Diagnosis Date Noted   Presence of heart assist device (HCC) 09/24/2024   TIA (transient ischemic attack) 06/01/2023   Pacemaker 01/18/2023   Dizziness 01/13/2021   Syncope 01/13/2021   Hypotension 09/06/2020   History  of colonic polyps 08/26/2019   Hyperglycemia 08/21/2017   Osteopenia 09/23/2016   Need for prophylactic antibiotic 09/23/2016   Tremor, benign 08/08/2016   Hyperlipidemia 08/08/2016   Insomnia 08/08/2016   History of cardiac catheterization 07/18/2016   SVT (supraventricular tachycardia) 07/18/2016   S/P mitral valve repair 03/18/2013   S/P Maze operation for atrial fibrillation 03/18/2013   Paroxysmal atrial fibrillation (HCC) 03/11/2013   History of PSVT (paroxysmal supraventricular tachycardia) 03/03/2013    Class: Chronic    ONSET DATE: 09/24/2024 referral date  REFERRING DIAG:   R25.1 (ICD-10-CM) - Tremor Per referring PCP: Reason for Referral:essential tremor - difficulty doing some ADLs THERAPY DIAG:  Other lack of coordination  Muscle weakness (generalized)  Tremor of right hand  Tremor of left hand  Rationale for Evaluation and Treatment: Rehabilitation  SUBJECTIVE:   SUBJECTIVE STATEMENT: I've looked into the cala bracelet, but it's contraindicated because I have a pacemaker. I have an appt with my neurologist and going to discuss this with her.  Pt accompanied by: self  PERTINENT HISTORY: TIA, dizziness, HLD, chronic PSVT, syncope  PRECAUTIONS: ICD/Pacemaker, fall risk  WEIGHT BEARING RESTRICTIONS: No  PAIN:  Are you having pain? No  FALLS: Has patient fallen in last 6 months? No  LIVING ENVIRONMENT: Lives with: lives with their spouse Lives in: House/apartment 2 level including basement Stairs: Yes: Internal: 13 steps; can reach both and External: 3 steps; can reach both Has following equipment at home: Grab bars  PLOF: Independent  PATIENT GOALS: Reduce tremors in R hand, be able to scoop coffee into filter without spilling  OBJECTIVE:  Note: Objective measures were completed at Evaluation unless otherwise noted.  HAND DOMINANCE: Right  ADLs: Overall ADLs: Independent, uses L hand sometimes Transfers/ambulation related to ADLs: Eating:  Unable to eat soup or anything involving scooping, has been using L hand  Grooming: Difficulty with plucking eyebrows with tweezers UB Dressing: Independent LB Dressing: Independent Toileting: I Bathing: I Tub Shower transfers: I Equipment: Grab bars  IADLs: Shopping: I Light housekeeping: I Meal Prep: Can complete but difficulty d/t using L hand and tremors in R hand Community mobility: I Medication management: I Financial management: I Handwriting: 100% legible and some shakiness   MOBILITY STATUS: Independent  POSTURE COMMENTS:  No Significant postural limitations   ACTIVITY TOLERANCE: Activity tolerance: Not impacted   FUNCTIONAL OUTCOME MEASURES: Upper Extremity Functional Scale (UEFS): 63/80 PSFS:  UPPER EXTREMITY ROM:    Active ROM Right eval Left eval  Shoulder flexion    Shoulder abduction    Shoulder adduction    Shoulder extension    Shoulder internal rotation    Shoulder external rotation    Elbow flexion    Elbow extension    Wrist flexion    Wrist extension    Wrist ulnar deviation    Wrist radial deviation    Wrist pronation    Wrist supination    (Blank rows =  not tested)  UPPER EXTREMITY MMT:     MMT Right eval Left eval  Shoulder flexion    Shoulder abduction    Shoulder adduction    Shoulder extension    Shoulder internal rotation    Shoulder external rotation    Middle trapezius    Lower trapezius    Elbow flexion    Elbow extension    Wrist flexion    Wrist extension    Wrist ulnar deviation    Wrist radial deviation    Wrist pronation    Wrist supination    (Blank rows = not tested)  HAND FUNCTION: Grip strength: Right: 48 lbs; Left: 47.67 lbs Right: 55, 47, 47 Left: 50, 46, 47 COORDINATION: 9 Hole Peg test: Right: 36.86 sec; Left: 27.89 sec Box and Blocks:  Right 47blocks, Left 50blocks Tremors: action, Right, and Left (worse in Right) PPT 2 (scooping beans R hand): 45.58 seconds  PPT (doing/undoing buttons):  19.04 seconds SENSATION: WFL  EDEMA: none   COGNITION: Overall cognitive status: Within functional limits for tasks assessed  VISION: Subjective report: No concerns, visits eye doctor every year  Baseline vision: Wears glasses all the time Visual history: none  VISION ASSESSMENT: WFL  Patient has difficulty with following activities due to following visual impairments: none  PERCEPTION: WFL  PRAXIS: WFL  OBSERVATIONS: Tremors in B hands, worse in R hand. This is affecting FM coordination which impairs ability to complete ADLs/IADLs and ability to go out to eat with family and friends                                                                                                                             TREATMENT DATE: 10/15/24  Pt educated in tremor compensation strategies and discussed strategies for desired tasks such as tweezing eyebrows and applying mascara, including bracing elbow or forearm on countertop of bathroom or tabletop while sitting and applying makeup or tweezing in that position. See Pt instructions for detailed handout.  Educated pt in weighted pens for improved ease with handwriting, instructed to not get a pen that has a wide grip d/t testing weighted pen with wide grip and handwriting becoming slightly less legible.   Next educated pt in FM coordination tasks for to improve coordination and dexterity in B hands for carryover with ADLs. See Pt instructions for handout provided.  PATIENT EDUCATION: Education details: SEE ABOVE Person educated: Patient Education method: Explanation Education comprehension: verbalized understanding  HOME EXERCISE PROGRAM: 10/15/24: Tremor mgmt, coordination   GOALS: Goals reviewed with patient? Yes  SHORT TERM GOALS: Target date: 10/31/24  Pt will be independent with tremor mgmt strategies Baseline: New to OP OT Goal status: IN PROGRESS  2.  Pt will be independent with HEPs for improved coordination with R hand   Baseline: New to OP OT Goal status: IN PROGRESS  3.  Pt will be educated in AE for improved completion of ADL/IADL such as grooming and self  feeding Baseline: new to OP OT Goal status: IN PROGRESS  4.  Pt will be able to place at least 51 blocks using right hand with completion of Box and Blocks test.  Baseline: Right 47blocks, Left 50 blocks Goal status: IN PROGRESS    LONG TERM GOALS: Target date: 11/21/24  Pt will demonstrate improved function by increased UEFS score to 70/80 Baseline: 63/80 Goal status: IN PROGRESS  2.  Pt will demonstrate improved function by a PSFS average score increase of at least 2 points, or an individual score increase of at least 3 points Baseline:  Goal status: IN PROGRESS  3.  Pt will demonstrate improved ease with feeding as evidenced by decreasing PPT#2 by at least 6 secs  Baseline: 45.58 seconds  Goal status: IN PROGRESS  4.  Patient will demo improved FM coordination as evidenced by completing nine-hole peg with use of R hand in 32 seconds or less.  Baseline: Right: 36.86 sec; Left: 27.89 sec Goal status: IN PROGRESS   ASSESSMENT:  CLINICAL IMPRESSION: Patient is a 68 y.o. female who was seen today for occupational therapy tx for Tremor in R hand. Hx includes pacemaker, CHF, paroxysmal afib, paroxysmal supraventricular tachycardia, arthritis. Pt receptive to AE and compensation education and participating well with HEP, motivated to participate. Pt would benefit from continued OT services to improve ability to complete ADL/IADL and improve quality of life.  PERFORMANCE DEFICITS: in functional skills including ADLs, IADLs, coordination, dexterity, Fine motor control, and UE functional use and psychosocial skills including coping strategies, environmental adaptation, interpersonal interactions, and routines and behaviors.   IMPAIRMENTS: are limiting patient from ADLs, IADLs, leisure, and social participation.   CO-MORBIDITIES: may have  co-morbidities  that affects occupational performance. Patient will benefit from skilled OT to address above impairments and improve overall function.  MODIFICATION OR ASSISTANCE TO COMPLETE EVALUATION: No modification of tasks or assist necessary to complete an evaluation.  OT OCCUPATIONAL PROFILE AND HISTORY: Problem focused assessment: Including review of records relating to presenting problem.  CLINICAL DECISION MAKING: LOW - limited treatment options, no task modification necessary  REHAB POTENTIAL: Good  EVALUATION COMPLEXITY: Low    PLAN:  OT FREQUENCY: 2x/week  OT DURATION: 6 weeks  PLANNED INTERVENTIONS: 97168 OT Re-evaluation, 97535 self care/ADL training, 02889 therapeutic exercise, 97530 therapeutic activity, passive range of motion, and DME and/or AE instructions  RECOMMENDED OTHER SERVICES: none  CONSULTED AND AGREED WITH PLAN OF CARE: Patient  PLAN FOR NEXT SESSION: Coordination activities F/u HEP Continued AE education   Zinc, [place] 10/15/2024, 8:43 AM

## 2024-10-15 NOTE — Patient Instructions (Addendum)
   Compensation Strategies for Tremors  When eating, try the following: . Eat out of bowls, divided plates, or use a plate guard (available at a medical supply store) and eat with a spoon so that you have an edge to scoop up food. . Try raising your plate so that there is less distance between the plate and mouth. . Try stabilizing elbows on the table or against your body. . Use utensil with built-up/larger grips as they are easier to hold.  When writing, try the following:   . Stabilize forearm on the table . Take your time as rushing/being stressed can increase tremors. . Try a felt-tipped pen, it does not glide as much.  Avoid gel pens (they move too much). . Consider using pre-printed labels with your name and address (carry them with you when you go out) or you can get stamps with your address or signature on it. . Use a small tape recorder to record messages/reminders for yourself. . Use pens with bigger grips.  When brushing your teeth, putting on make-up, or styling hair, try the following: . Use an electric toothbrush. . Use items with built-up grips. . Stabilize your elbows against your body or on the counter. . Use long-handled brushes/combs. . Use a hair dryer with a stand.  In general: . Avoid stress, fatigue, or rushing as this can increase tremors. . Sit down for activities that require more control/coordination.

## 2024-10-22 ENCOUNTER — Other Ambulatory Visit

## 2024-10-22 ENCOUNTER — Ambulatory Visit

## 2024-10-22 DIAGNOSIS — R251 Tremor, unspecified: Secondary | ICD-10-CM | POA: Insufficient documentation

## 2024-10-22 DIAGNOSIS — M6281 Muscle weakness (generalized): Secondary | ICD-10-CM | POA: Diagnosis present

## 2024-10-22 DIAGNOSIS — R278 Other lack of coordination: Secondary | ICD-10-CM | POA: Insufficient documentation

## 2024-10-22 DIAGNOSIS — Z006 Encounter for examination for normal comparison and control in clinical research program: Secondary | ICD-10-CM

## 2024-10-22 NOTE — Therapy (Signed)
 OUTPATIENT OCCUPATIONAL THERAPY NEURO TREATMENT  Patient Name: Ariel Simpson MRN: [id number] DOB:10-23-1956, 68 y.o., female Today's Date: 10/22/2024  PCP: Geofm Glade PARAS, MD REFERRING PROVIDER: Geofm Glade PARAS, MD  END OF SESSION:  OT End of Session - 10/22/24 1449     Visit Number 3    Number of Visits 13    Date for Recertification  11/21/24    Authorization Type [employer] MCR    Authorization Time Period 10/08/24-11/19/24    Authorization - Visit Number 3    Authorization - Number of Visits 13    Progress Note Due on Visit 10    OT Start Time 1447    OT Stop Time 1530    OT Time Calculation (min) 43 min    Activity Tolerance Patient tolerated treatment well    Behavior During Therapy WFL for tasks assessed/performed           Past Medical History:  Diagnosis Date   Arthritis    Benign essential tremor    CHF (congestive heart failure) (HCC)    Heart murmur    High cholesterol    Hypertension    Insomnia    Mitral regurgitation    Mitral valve prolapse    Osteopenia    Paroxysmal atrial fibrillation (HCC) 03/11/2013   Paroxysmal supraventricular tachycardia    PONV (postoperative nausea and vomiting)    Dizziness and Nausea   S/P Maze operation for atrial fibrillation 03/18/2013   Complete bilateral atrial lesion set using cryothermy and biopolar radiofrequency ablation with oversewing of LA appendage via right mini thoracotomy   S/P mitral valve repair 03/18/2013   Complex valvuloplasty including triangular resection of posterior leaflet, artificial Gore-tex neocord placement x4 and 30mm Sorin Memo 3D ring annuloplasty via right mini thoracotomy   Severe mitral regurgitation 03/03/2013   Acute on chronic with acute heart failure    Shortness of breath    Wears glasses    Past Surgical History:  Procedure Laterality Date   ABDOMINAL HYSTERECTOMY     hernia repair   BIOPSY  08/26/2019   Procedure: BIOPSY;  Surgeon: Dianna Specking, MD;  Location: [facility];   Service: Endoscopy;;   CARDIAC CATHETERIZATION     COLONOSCOPY     COLONOSCOPY WITH PROPOFOL  N/A 08/26/2019   Procedure: COLONOSCOPY WITH PROPOFOL ;  Surgeon: Dianna Specking, MD;  Location: [facility];  Service: Endoscopy;  Laterality: N/A;   INTRAOPERATIVE TRANSESOPHAGEAL ECHOCARDIOGRAM N/A 03/18/2013   Procedure: INTRAOPERATIVE TRANSESOPHAGEAL ECHOCARDIOGRAM;  Surgeon: Sudie VEAR Laine, MD;  Location: [facility] OR;  Service: Open Heart Surgery;  Laterality: N/A;   LEFT AND RIGHT HEART CATHETERIZATION WITH CORONARY ANGIOGRAM N/A 03/06/2013   Procedure: LEFT AND RIGHT HEART CATHETERIZATION WITH CORONARY ANGIOGRAM;  Surgeon: Victory LELON Claudene DOUGLAS, MD;  Location: [facility] CATH LAB;  Service: Cardiovascular;  Laterality: N/A;   LOOP RECORDER REMOVAL N/A 09/20/2022   Procedure: LOOP RECORDER REMOVAL;  Surgeon: Waddell Danelle LELON, MD;  Location: [facility] INVASIVE CV LAB;  Service: Cardiovascular;  Laterality: N/A;   MAZE N/A 03/18/2013   Procedure: MAZE;  Surgeon: Sudie VEAR Laine, MD;  Location: [facility] OR;  Service: Open Heart Surgery;  Laterality: N/A;   MITRAL VALVE REPAIR Right 03/18/2013   Procedure: MINIMALLY INVASIVE MITRAL VALVE REPAIR (MVR);  Surgeon: Sudie VEAR Laine, MD;  Location: [facility] OR;  Service: Open Heart Surgery;  Laterality: Right;   PACEMAKER IMPLANT N/A 09/20/2022   Procedure: PACEMAKER IMPLANT;  Surgeon: Waddell Danelle LELON, MD;  Location: [facility] INVASIVE CV LAB;  Service: Cardiovascular;  Laterality: N/A;   TRANSESOPHAGEAL ECHOCARDIOGRAM (CATH LAB) N/A 12/25/2023   Procedure: TRANSESOPHAGEAL ECHOCARDIOGRAM;  Surgeon: Loni Soyla LABOR, MD;  Location: [facility] INVASIVE CV LAB;  Service: Cardiovascular;  Laterality: N/A;   VARICOSE VEIN SURGERY     WISDOM TOOTH EXTRACTION     Patient Active Problem List   Diagnosis Date Noted   Presence of heart assist device (HCC) 09/24/2024   TIA (transient ischemic attack) 06/01/2023   Pacemaker 01/18/2023   Dizziness 01/13/2021   Syncope 01/13/2021   Hypotension 09/06/2020   History of  colonic polyps 08/26/2019   Hyperglycemia 08/21/2017   Osteopenia 09/23/2016   Need for prophylactic antibiotic 09/23/2016   Tremor, benign 08/08/2016   Hyperlipidemia 08/08/2016   Insomnia 08/08/2016   History of cardiac catheterization 07/18/2016   SVT (supraventricular tachycardia) 07/18/2016   S/P mitral valve repair 03/18/2013   S/P Maze operation for atrial fibrillation 03/18/2013   Paroxysmal atrial fibrillation (HCC) 03/11/2013   History of PSVT (paroxysmal supraventricular tachycardia) 03/03/2013    Class: Chronic    ONSET DATE: 09/24/2024 referral date  REFERRING DIAG:   R25.1 (ICD-10-CM) - Tremor Per referring PCP: Reason for Referral:essential tremor - difficulty doing some ADLs THERAPY DIAG:  Other lack of coordination  Tremor of left hand  Tremor of right hand  Muscle weakness (generalized)  Rationale for Evaluation and Treatment: Rehabilitation  SUBJECTIVE:   SUBJECTIVE STATEMENT: Pt reports no big changes, but they change over time, I have good days and bad days. I've been doing the activities you've been giving me as much as I can, we were pretty busy.  Pt accompanied by: self  PERTINENT HISTORY: TIA, dizziness, HLD, chronic PSVT, syncope  PRECAUTIONS: ICD/Pacemaker, fall risk  WEIGHT BEARING RESTRICTIONS: No  PAIN:  Are you having pain? No  FALLS: Has patient fallen in last 6 months? No  LIVING ENVIRONMENT: Lives with: lives with their spouse Lives in: House/apartment 2 level including basement Stairs: Yes: Internal: 13 steps; can reach both and External: 3 steps; can reach both Has following equipment at home: Grab bars  PLOF: Independent  PATIENT GOALS: Reduce tremors in R hand, be able to scoop coffee into filter without spilling  OBJECTIVE:  Note: Objective measures were completed at Evaluation unless otherwise noted.  HAND DOMINANCE: Right  ADLs: Overall ADLs: Independent, uses L hand sometimes Transfers/ambulation related to  ADLs: Eating: Unable to eat soup or anything involving scooping, has been using L hand  Grooming: Difficulty with plucking eyebrows with tweezers UB Dressing: Independent LB Dressing: Independent Toileting: I Bathing: I Tub Shower transfers: I Equipment: Grab bars  IADLs: Shopping: I Light housekeeping: I Meal Prep: Can complete but difficulty d/t using L hand and tremors in R hand Community mobility: I Medication management: I Financial management: I Handwriting: 100% legible and some shakiness   MOBILITY STATUS: Independent  POSTURE COMMENTS:  No Significant postural limitations   ACTIVITY TOLERANCE: Activity tolerance: Not impacted   FUNCTIONAL OUTCOME MEASURES: Upper Extremity Functional Scale (UEFS): 63/80 PSFS:  UPPER EXTREMITY ROM:    Active ROM Right eval Left eval  Shoulder flexion    Shoulder abduction    Shoulder adduction    Shoulder extension    Shoulder internal rotation    Shoulder external rotation    Elbow flexion    Elbow extension    Wrist flexion    Wrist extension    Wrist ulnar deviation    Wrist radial deviation    Wrist pronation  Wrist supination    (Blank rows = not tested)  UPPER EXTREMITY MMT:     MMT Right eval Left eval  Shoulder flexion    Shoulder abduction    Shoulder adduction    Shoulder extension    Shoulder internal rotation    Shoulder external rotation    Middle trapezius    Lower trapezius    Elbow flexion    Elbow extension    Wrist flexion    Wrist extension    Wrist ulnar deviation    Wrist radial deviation    Wrist pronation    Wrist supination    (Blank rows = not tested)  HAND FUNCTION: Grip strength: Right: 48 lbs; Left: 47.67 lbs Right: 55, 47, 47 Left: 50, 46, 47 COORDINATION: 9 Hole Peg test: Right: 36.86 sec; Left: 27.89 sec Box and Blocks:  Right 47blocks, Left 50blocks Tremors: action, Right, and Left (worse in Right) PPT 2 (scooping beans R hand): 45.58 seconds  PPT  (doing/undoing buttons): 19.04 seconds SENSATION: WFL  EDEMA: none   COGNITION: Overall cognitive status: Within functional limits for tasks assessed  VISION: Subjective report: No concerns, visits eye doctor every year  Baseline vision: Wears glasses all the time Visual history: none  VISION ASSESSMENT: WFL  Patient has difficulty with following activities due to following visual impairments: none  PERCEPTION: WFL  PRAXIS: WFL  OBSERVATIONS: Tremors in B hands, worse in R hand. This is affecting FM coordination which impairs ability to complete ADLs/IADLs and ability to go out to eat with family and friends                                                                                                                             TREATMENT DATE:  10/22/24  Pt engaged in FM coordination activities with weighted braces to determine if any improvement in tremor mgmt, tried 1-2 lbs weights while recreating peg board pattern with small pegs in R hand. Pt reported some improvement but expressed concerns with writing on boards. Tried with paper taped to mirror, little to no improvement. Tried 3 lb weight, some improvement noted. Educated pt in where to obtain. Educated pt in weighted utensils with no built up grip for improved self feeding.     10/15/24  Pt educated in tremor compensation strategies and discussed strategies for desired tasks such as tweezing eyebrows and applying mascara, including bracing elbow or forearm on countertop of bathroom or tabletop while sitting and applying makeup or tweezing in that position. See Pt instructions for detailed handout.  Educated pt in weighted pens for improved ease with handwriting, instructed to not get a pen that has a wide grip d/t testing weighted pen with wide grip and handwriting becoming slightly less legible.   Next educated pt in FM coordination tasks for to improve coordination and dexterity in B hands for carryover with ADLs.  See Pt instructions for handout provided.  PATIENT EDUCATION: Education details: SEE ABOVE  Person educated: Patient Education method: Explanation Education comprehension: verbalized understanding  HOME EXERCISE PROGRAM: 10/15/24: Tremor mgmt, coordination   GOALS: Goals reviewed with patient? Yes  SHORT TERM GOALS: Target date: 10/31/24  Pt will be independent with tremor mgmt strategies Baseline: New to OP OT Goal status: IN PROGRESS  2.  Pt will be independent with HEPs for improved coordination with R hand  Baseline: New to OP OT Goal status: IN PROGRESS  3.  Pt will be educated in AE for improved completion of ADL/IADL such as grooming and self feeding Baseline: new to OP OT Goal status: IN PROGRESS  4.  Pt will be able to place at least 51 blocks using right hand with completion of Box and Blocks test.  Baseline: Right 47blocks, Left 50 blocks Goal status: IN PROGRESS    LONG TERM GOALS: Target date: 11/21/24  Pt will demonstrate improved function by increased UEFS score to 70/80 Baseline: 63/80 Goal status: IN PROGRESS  2.  Pt will demonstrate improved function by a PSFS average score increase of at least 2 points, or an individual score increase of at least 3 points Baseline:  Goal status: IN PROGRESS  3.  Pt will demonstrate improved ease with feeding as evidenced by decreasing PPT#2 by at least 6 secs  Baseline: 45.58 seconds  Goal status: IN PROGRESS  4.  Patient will demo improved FM coordination as evidenced by completing nine-hole peg with use of R hand in 32 seconds or less.  Baseline: Right: 36.86 sec; Left: 27.89 sec Goal status: IN PROGRESS   ASSESSMENT:  CLINICAL IMPRESSION: Patient is a 68 y.o. female who was seen today for occupational therapy tx for Tremor in R hand. Hx includes pacemaker, CHF, paroxysmal afib, paroxysmal supraventricular tachycardia, arthritis. Pt receptive to AE and compensation education and participating well with HEP,  motivated to participate. Pt would benefit from continued OT services to improve ability to complete ADL/IADL and improve quality of life and improve functional use of BUE  PERFORMANCE DEFICITS: in functional skills including ADLs, IADLs, coordination, dexterity, Fine motor control, and UE functional use and psychosocial skills including coping strategies, environmental adaptation, interpersonal interactions, and routines and behaviors.   IMPAIRMENTS: are limiting patient from ADLs, IADLs, leisure, and social participation.   CO-MORBIDITIES: may have co-morbidities  that affects occupational performance. Patient will benefit from skilled OT to address above impairments and improve overall function.  MODIFICATION OR ASSISTANCE TO COMPLETE EVALUATION: No modification of tasks or assist necessary to complete an evaluation.  OT OCCUPATIONAL PROFILE AND HISTORY: Problem focused assessment: Including review of records relating to presenting problem.  CLINICAL DECISION MAKING: LOW - limited treatment options, no task modification necessary  REHAB POTENTIAL: Good  EVALUATION COMPLEXITY: Low    PLAN:  OT FREQUENCY: 2x/week  OT DURATION: 6 weeks  PLANNED INTERVENTIONS: 97168 OT Re-evaluation, 97535 self care/ADL training, [id number] therapeutic exercise, 97530 therapeutic activity, passive range of motion, and DME and/or AE instructions  RECOMMENDED OTHER SERVICES: none  CONSULTED AND AGREED WITH PLAN OF CARE: Patient  PLAN FOR NEXT SESSION: Coordination activities F/u HEP Continued AE education Tremor mgmt exercises? F/u weighted utensils F/u 3 lbs wrist weights CIMT to improve functional use of L hand  Rocky Dutch, OT 10/22/2024, 2:49 PM

## 2024-10-23 ENCOUNTER — Ambulatory Visit: Payer: Medicare Other | Admitting: Primary Care

## 2024-10-24 ENCOUNTER — Ambulatory Visit: Admitting: Occupational Therapy

## 2024-10-24 DIAGNOSIS — R251 Tremor, unspecified: Secondary | ICD-10-CM

## 2024-10-24 DIAGNOSIS — M6281 Muscle weakness (generalized): Secondary | ICD-10-CM

## 2024-10-24 DIAGNOSIS — R278 Other lack of coordination: Secondary | ICD-10-CM

## 2024-10-24 NOTE — Therapy (Signed)
 OUTPATIENT OCCUPATIONAL THERAPY NEURO TREATMENT  Patient Name: Ariel Simpson MRN: [id number] DOB:August 07, 1956, 68 y.o., female Today's Date: 10/24/2024  PCP: Geofm Glade PARAS, MD REFERRING PROVIDER: Geofm Glade PARAS, MD  END OF SESSION:  OT End of Session - 10/24/24 0858     Visit Number 4    Number of Visits 13    Date for Recertification  11/21/24    Authorization Type [employer] MCR    Authorization Time Period 10/08/24-11/19/24    Authorization - Visit Number 4    Authorization - Number of Visits 13    Progress Note Due on Visit 10    OT Start Time 0847    OT Stop Time 0929    OT Time Calculation (min) 42 min    Activity Tolerance Patient tolerated treatment well    Behavior During Therapy [facility] for tasks assessed/performed            Past Medical History:  Diagnosis Date   Arthritis    Benign essential tremor    CHF (congestive heart failure) (HCC)    Heart murmur    High cholesterol    Hypertension    Insomnia    Mitral regurgitation    Mitral valve prolapse    Osteopenia    Paroxysmal atrial fibrillation (HCC) 03/11/2013   Paroxysmal supraventricular tachycardia    PONV (postoperative nausea and vomiting)    Dizziness and Nausea   S/P Maze operation for atrial fibrillation 03/18/2013   Complete bilateral atrial lesion set using cryothermy and biopolar radiofrequency ablation with oversewing of LA appendage via right mini thoracotomy   S/P mitral valve repair 03/18/2013   Complex valvuloplasty including triangular resection of posterior leaflet, artificial Gore-tex neocord placement x4 and 30mm Sorin Memo 3D ring annuloplasty via right mini thoracotomy   Severe mitral regurgitation 03/03/2013   Acute on chronic with acute heart failure    Shortness of breath    Wears glasses    Past Surgical History:  Procedure Laterality Date   ABDOMINAL HYSTERECTOMY     hernia repair   BIOPSY  08/26/2019   Procedure: BIOPSY;  Surgeon: Dianna Specking, MD;  Location: [facility]  ENDOSCOPY;  Service: Endoscopy;;   CARDIAC CATHETERIZATION     COLONOSCOPY     COLONOSCOPY WITH PROPOFOL  N/A 08/26/2019   Procedure: COLONOSCOPY WITH PROPOFOL ;  Surgeon: Dianna Specking, MD;  Location: [facility] ENDOSCOPY;  Service: Endoscopy;  Laterality: N/A;   INTRAOPERATIVE TRANSESOPHAGEAL ECHOCARDIOGRAM N/A 03/18/2013   Procedure: INTRAOPERATIVE TRANSESOPHAGEAL ECHOCARDIOGRAM;  Surgeon: Sudie VEAR Laine, MD;  Location: [facility] OR;  Service: Open Heart Surgery;  Laterality: N/A;   LEFT AND RIGHT HEART CATHETERIZATION WITH CORONARY ANGIOGRAM N/A 03/06/2013   Procedure: LEFT AND RIGHT HEART CATHETERIZATION WITH CORONARY ANGIOGRAM;  Surgeon: Victory LELON Claudene DOUGLAS, MD;  Location: [facility] CATH LAB;  Service: Cardiovascular;  Laterality: N/A;   LOOP RECORDER REMOVAL N/A 09/20/2022   Procedure: LOOP RECORDER REMOVAL;  Surgeon: Waddell Danelle LELON, MD;  Location: [facility] INVASIVE CV LAB;  Service: Cardiovascular;  Laterality: N/A;   MAZE N/A 03/18/2013   Procedure: MAZE;  Surgeon: Sudie VEAR Laine, MD;  Location: [facility] OR;  Service: Open Heart Surgery;  Laterality: N/A;   MITRAL VALVE REPAIR Right 03/18/2013   Procedure: MINIMALLY INVASIVE MITRAL VALVE REPAIR (MVR);  Surgeon: Sudie VEAR Laine, MD;  Location: [facility] OR;  Service: Open Heart Surgery;  Laterality: Right;   PACEMAKER IMPLANT N/A 09/20/2022   Procedure: PACEMAKER IMPLANT;  Surgeon: Waddell Danelle LELON, MD;  Location: [facility] INVASIVE CV LAB;  Service: Cardiovascular;  Laterality: N/A;   TRANSESOPHAGEAL ECHOCARDIOGRAM (CATH LAB) N/A 12/25/2023   Procedure: TRANSESOPHAGEAL ECHOCARDIOGRAM;  Surgeon: Loni Soyla LABOR, MD;  Location: [facility] INVASIVE CV LAB;  Service: Cardiovascular;  Laterality: N/A;   VARICOSE VEIN SURGERY     WISDOM TOOTH EXTRACTION     Patient Active Problem List   Diagnosis Date Noted   Presence of heart assist device (HCC) 09/24/2024   TIA (transient ischemic attack) 06/01/2023   Pacemaker 01/18/2023   Dizziness 01/13/2021   Syncope 01/13/2021   Hypotension 09/06/2020    History of colonic polyps 08/26/2019   Hyperglycemia 08/21/2017   Osteopenia 09/23/2016   Need for prophylactic antibiotic 09/23/2016   Tremor, benign 08/08/2016   Hyperlipidemia 08/08/2016   Insomnia 08/08/2016   History of cardiac catheterization 07/18/2016   SVT (supraventricular tachycardia) 07/18/2016   S/P mitral valve repair 03/18/2013   S/P Maze operation for atrial fibrillation 03/18/2013   Paroxysmal atrial fibrillation (HCC) 03/11/2013   History of PSVT (paroxysmal supraventricular tachycardia) 03/03/2013    Class: Chronic    ONSET DATE: 09/24/2024 referral date  REFERRING DIAG:   R25.1 (ICD-10-CM) - Tremor Per referring PCP: Reason for Referral:essential tremor - difficulty doing some ADLs THERAPY DIAG:  Other lack of coordination  Tremor of left hand  Tremor of right hand  Muscle weakness (generalized)  Rationale for Evaluation and Treatment: Rehabilitation  SUBJECTIVE:   SUBJECTIVE STATEMENT: Pt reports that she is using strategies to stabilize the right hand the best that I can.  But what I really need is to learn how to use my left hand.     Pt accompanied by: self  PERTINENT HISTORY: TIA, dizziness, HLD, chronic PSVT, syncope  PRECAUTIONS: ICD/Pacemaker, fall risk  WEIGHT BEARING RESTRICTIONS: No  PAIN:  Are you having pain? No  FALLS: Has patient fallen in last 6 months? No  LIVING ENVIRONMENT: Lives with: lives with their spouse Lives in: House/apartment 2 level including basement Stairs: Yes: Internal: 13 steps; can reach both and External: 3 steps; can reach both Has following equipment at home: Grab bars  PLOF: Independent  PATIENT GOALS: Reduce tremors in R hand, be able to scoop coffee into filter without spilling  OBJECTIVE:  Note: Objective measures were completed at Evaluation unless otherwise noted.  HAND DOMINANCE: Right  ADLs: Overall ADLs: Independent, uses L hand sometimes Transfers/ambulation related to  ADLs: Eating: Unable to eat soup or anything involving scooping, has been using L hand  Grooming: Difficulty with plucking eyebrows with tweezers UB Dressing: Independent LB Dressing: Independent Toileting: I Bathing: I Tub Shower transfers: I Equipment: Grab bars  IADLs: Shopping: I Light housekeeping: I Meal Prep: Can complete but difficulty d/t using L hand and tremors in R hand Community mobility: I Medication management: I Financial management: I Handwriting: 100% legible and some shakiness   MOBILITY STATUS: Independent  POSTURE COMMENTS:  No Significant postural limitations   ACTIVITY TOLERANCE: Activity tolerance: Not impacted   FUNCTIONAL OUTCOME MEASURES: Upper Extremity Functional Scale (UEFS): 63/80 PSFS:  UPPER EXTREMITY ROM:    Active ROM Right eval Left eval  Shoulder flexion    Shoulder abduction    Shoulder adduction    Shoulder extension    Shoulder internal rotation    Shoulder external rotation    Elbow flexion    Elbow extension    Wrist flexion    Wrist extension    Wrist ulnar deviation    Wrist radial deviation    Wrist pronation  Wrist supination    (Blank rows = not tested)  UPPER EXTREMITY MMT:     MMT Right eval Left eval  Shoulder flexion    Shoulder abduction    Shoulder adduction    Shoulder extension    Shoulder internal rotation    Shoulder external rotation    Middle trapezius    Lower trapezius    Elbow flexion    Elbow extension    Wrist flexion    Wrist extension    Wrist ulnar deviation    Wrist radial deviation    Wrist pronation    Wrist supination    (Blank rows = not tested)  HAND FUNCTION: Grip strength: Right: 48 lbs; Left: 47.67 lbs Right: 55, 47, 47 Left: 50, 46, 47 COORDINATION: 9 Hole Peg test: Right: 36.86 sec; Left: 27.89 sec Box and Blocks:  Right 47blocks, Left 50blocks Tremors: action, Right, and Left (worse in Right) PPT 2 (scooping beans R hand): 45.58 seconds  PPT  (doing/undoing buttons): 19.04 seconds SENSATION: WFL  EDEMA: none   COGNITION: Overall cognitive status: Within functional limits for tasks assessed  VISION: Subjective report: No concerns, visits eye doctor every year  Baseline vision: Wears glasses all the time Visual history: none  VISION ASSESSMENT: WFL  Patient has difficulty with following activities due to following visual impairments: none  PERCEPTION: WFL  PRAXIS: WFL  OBSERVATIONS: Tremors in B hands, worse in R hand. This is affecting FM coordination which impairs ability to complete ADLs/IADLs and ability to go out to eat with family and friends                                                                                                                             TREATMENT DATE:  10/24/24 Coordination: engaged in rotating pen between all fingers as well as rotating like a baton clockwise and counterclockwise, completing bilaterally. Self-feeding: problem solving scooping and spearing foods with left hand to problem solve positioning of hand on utensil and movement pattern when bringing food from plate to mouth.  OT providing demonstration and cues for improved positioning of utensils in hand and use of ballet first position hands to mimic smooth movement of wrist, elbow, and even some shoulder when scooping and bringing utensil to mouth.  Completed with use of putty, beans, and paper towel to simulate various food textures.  Pt demonstrating improved motor control with LUE.  Pt able to utilize RUE with cutting due to hand positioning and stabilizing arm along torso.    Lacing activity: simulated threading needle and needlepoint with use of LUE.  Pt demonstrating improved motor control with threading with LUE while holding lacing card in R hand.  Did attempt with RUE and due to more open chain movements, pt reflecting that she performed better with her LUE.  OT educated on bracing R hand at wrist on table top when  threading with L hand to provide increased stability.   10/22/24  Pt engaged in FM coordination activities with weighted braces to determine if any improvement in tremor mgmt, tried 1-2 lbs weights while recreating peg board pattern with small pegs in R hand. Pt reported some improvement but expressed concerns with writing on boards. Tried with paper taped to mirror, little to no improvement. Tried 3 lb weight, some improvement noted. Educated pt in where to obtain. Educated pt in weighted utensils with no built up grip for improved self feeding.     10/15/24  Pt educated in tremor compensation strategies and discussed strategies for desired tasks such as tweezing eyebrows and applying mascara, including bracing elbow or forearm on countertop of bathroom or tabletop while sitting and applying makeup or tweezing in that position. See Pt instructions for detailed handout.  Educated pt in weighted pens for improved ease with handwriting, instructed to not get a pen that has a wide grip d/t testing weighted pen with wide grip and handwriting becoming slightly less legible.   Next educated pt in FM coordination tasks for to improve coordination and dexterity in B hands for carryover with ADLs. See Pt instructions for handout provided.  PATIENT EDUCATION: Education details: SEE ABOVE Person educated: Patient Education method: Explanation Education comprehension: verbalized understanding  HOME EXERCISE PROGRAM: 10/15/24: Tremor mgmt, coordination   GOALS: Goals reviewed with patient? Yes  SHORT TERM GOALS: Target date: 10/31/24  Pt will be independent with tremor mgmt strategies Baseline: New to OP OT Goal status: IN PROGRESS  2.  Pt will be independent with HEPs for improved coordination with R hand  Baseline: New to OP OT Goal status: IN PROGRESS  3.  Pt will be educated in AE for improved completion of ADL/IADL such as grooming and self feeding Baseline: new to OP OT Goal status:  IN PROGRESS  4.  Pt will be able to place at least 51 blocks using right hand with completion of Box and Blocks test.  Baseline: Right 47blocks, Left 50 blocks Goal status: IN PROGRESS    LONG TERM GOALS: Target date: 11/21/24  Pt will demonstrate improved function by increased UEFS score to 70/80 Baseline: 63/80 Goal status: IN PROGRESS  2.  Pt will demonstrate improved function by a PSFS average score increase of at least 2 points, or an individual score increase of at least 3 points Baseline:  Goal status: IN PROGRESS  3.  Pt will demonstrate improved ease with feeding as evidenced by decreasing PPT#2 by at least 6 secs  Baseline: 45.58 seconds  Goal status: IN PROGRESS  4.  Patient will demo improved FM coordination as evidenced by completing nine-hole peg with use of R hand in 32 seconds or less.  Baseline: Right: 36.86 sec; Left: 27.89 sec Goal status: IN PROGRESS   ASSESSMENT:  CLINICAL IMPRESSION: Patient is a 68 y.o. female who was seen today for occupational therapy tx for Tremor in R hand. Pt expressing desire to focus on use of LUE as dominant UE.  Pt demonstrating improvements in simulated self-feeding with use of LUE with spoon and fork with various textures and demonstrating improved ease with fluid UB movements when brining food to mouth.  Pt demonstrating good ability and understanding with modifications. Pt would benefit from continued OT services to improve ability to complete ADL/IADL and improve quality of life and improve functional use of BUE.  PERFORMANCE DEFICITS: in functional skills including ADLs, IADLs, coordination, dexterity, Fine motor control, and UE functional use and psychosocial skills including coping strategies, environmental adaptation, interpersonal interactions, and  routines and behaviors.     PLAN:  OT FREQUENCY: 2x/week  OT DURATION: 6 weeks  PLANNED INTERVENTIONS: 97168 OT Re-evaluation, 97535 self care/ADL training, 02889  therapeutic exercise, 97530 therapeutic activity, passive range of motion, and DME and/or AE instructions  RECOMMENDED OTHER SERVICES: none  CONSULTED AND AGREED WITH PLAN OF CARE: Patient  PLAN FOR NEXT SESSION: Coordination activities F/u HEP Continued AE education Tremor mgmt exercises? F/u weighted utensils F/u 3 lbs wrist weights CIMT to improve functional use of L hand  Emmalena Canny, OTR/L 10/24/2024, 9:45 AM   [facility] [address] [place], [address] [place], [place] [address] Phone # [phone] Fax # [phone]

## 2024-10-25 ENCOUNTER — Encounter: Payer: Self-pay | Admitting: Internal Medicine

## 2024-10-27 ENCOUNTER — Other Ambulatory Visit: Payer: Self-pay

## 2024-10-27 MED ORDER — ATORVASTATIN CALCIUM 20 MG PO TABS: 20.0000 mg | ORAL_TABLET | Freq: Every day | ORAL | 1 refills | Status: AC

## 2024-11-01 LAB — GENECONNECT MOLECULAR SCREEN

## 2024-11-02 ENCOUNTER — Other Ambulatory Visit: Payer: Self-pay

## 2024-11-03 ENCOUNTER — Encounter: Payer: Self-pay | Admitting: Primary Care

## 2024-11-03 ENCOUNTER — Ambulatory Visit: Attending: Primary Care | Admitting: Primary Care

## 2024-11-03 VITALS — BP 122/70 | HR 72 | Ht 63.58 in | Wt 112.4 lb

## 2024-11-03 DIAGNOSIS — E785 Hyperlipidemia, unspecified: Secondary | ICD-10-CM | POA: Insufficient documentation

## 2024-11-03 DIAGNOSIS — E039 Hypothyroidism, unspecified: Secondary | ICD-10-CM | POA: Insufficient documentation

## 2024-11-03 DIAGNOSIS — Z0000 Encounter for general adult medical examination without abnormal findings: Secondary | ICD-10-CM

## 2024-11-03 DIAGNOSIS — I10 Essential (primary) hypertension: Secondary | ICD-10-CM | POA: Insufficient documentation

## 2024-11-03 MED ORDER — FLUTICASONE-SALMETEROL 250-50 MCG/ACT IN AEPB *I*: 1.0000 | INHALATION_SPRAY | Freq: Two times a day (BID) | RESPIRATORY_TRACT | 3 refills | Status: AC | PRN
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
## 2024-11-03 NOTE — Patient Instructions (Signed)
 Thank you for completing your Subsequent Annual Medicare Visit with us  today. The purpose of this visits was un:Drmzzw for diseaseAssess risk of future medical problemsHelp develop a healthy lifestyleUpdate vaccinesGet to know your doctor in case of an illnessPatient Care Team:Glaspie, Megan (Inactive) as PCP - CCP-AUDIOLOGYBonenfant, Powell, MD as PCP - Lynda, Sharlet Holts, AUD as PCP - CCP-AUDIOLOGYAudiology, DalzellDigiovanni, Benedict, MD (Orthopedic Surgery) Medicare 5 Year PlanThe following items were identified as areas of concern during your screening today:High Blood Pressure (Hypertension) - This is a risk factor for Heart Attack, Stroke, Kidney Problems and Eye Problems. The Health Maintenance table below identifies screening tests and immunizations recommended by your health care team:Health Maintenance: These screening recommendations are based on [place]PSTF, pulte homes, and [place] state guidelines Topic Date Due  RSV Vaccine (1 - Risk 50-74 years 1-dose series) Never done  Shingles Vaccine (2 of 3) 03/27/2014  Colon Cancer Screening - USPSTF  07/12/2022  HIV Screening  08/08/2040 (Originally 12/04/1968)  COVID-19 Vaccine (8 - 2025-26 season) 01/21/2025  Breast Cancer Screening  02/13/2025  Depression - Yearly  11/03/2025  Fall Risk  11/03/2025  Osteoporosis Screening  08/15/2026  Cervical Cancer Screening  04/22/2027  DTaP/Tdap/Td Vaccines (4 - Td or Tdap) 12/16/2028  HPV Vaccine (No Doses Required) Completed  Flu Shot  Completed  Hepatitis C Screening  Completed  Pneumococcal Vaccination  Completed  Hepatitis B Vaccine  Aged Out  HIB Vaccine  Aged Out  Meningococcal Vaccine  Aged Out  Rotavirus Vaccine  Aged Out  Meningitis Vaccine  Aged Out In addition, goals and orders placed to address these recommendations are listed in the Today's Visit section.We wish you the best of  health and look forward to seeing you again next year for your Annual Medicare Wellness Visit. If you have any health care concerns before then, please do not hesitate to contact us .

## 2024-11-05 ENCOUNTER — Ambulatory Visit

## 2024-11-05 DIAGNOSIS — R278 Other lack of coordination: Secondary | ICD-10-CM | POA: Diagnosis not present

## 2024-11-05 DIAGNOSIS — M6281 Muscle weakness (generalized): Secondary | ICD-10-CM

## 2024-11-05 DIAGNOSIS — R251 Tremor, unspecified: Secondary | ICD-10-CM

## 2024-11-05 NOTE — Therapy (Signed)
 OUTPATIENT OCCUPATIONAL THERAPY NEURO TREATMENT  Patient Name: Ariel Simpson MRN: [id number] DOB:[birth year], 68 y.o., female Today's Date: 11/05/2024  PCP: Geofm Glade PARAS, MD REFERRING PROVIDER: Geofm Glade PARAS, MD  END OF SESSION:  OT End of Session - 11/05/24 1538     Visit Number 5    Number of Visits 13    Date for Recertification  11/21/24    Authorization Type [employer] MCR    Authorization Time Period 10/08/24-11/19/24    Authorization - Visit Number 5    Authorization - Number of Visits 13    Progress Note Due on Visit 10    OT Start Time 1535    Activity Tolerance Patient tolerated treatment well    Behavior During Therapy [facility] for tasks assessed/performed            Past Medical History:  Diagnosis Date   Arthritis    Benign essential tremor    CHF (congestive heart failure) (HCC)    Heart murmur    High cholesterol    Hypertension    Insomnia    Mitral regurgitation    Mitral valve prolapse    Osteopenia    Paroxysmal atrial fibrillation (HCC) 03/11/2013   Paroxysmal supraventricular tachycardia    PONV (postoperative nausea and vomiting)    Dizziness and Nausea   S/P Maze operation for atrial fibrillation 03/18/2013   Complete bilateral atrial lesion set using cryothermy and biopolar radiofrequency ablation with oversewing of LA appendage via right mini thoracotomy   S/P mitral valve repair 03/18/2013   Complex valvuloplasty including triangular resection of posterior leaflet, artificial Gore-tex neocord placement x4 and 30mm Sorin Memo 3D ring annuloplasty via right mini thoracotomy   Severe mitral regurgitation 03/03/2013   Acute on chronic with acute heart failure    Shortness of breath    Wears glasses    Past Surgical History:  Procedure Laterality Date   ABDOMINAL HYSTERECTOMY     hernia repair   BIOPSY  08/26/2019   Procedure: BIOPSY;  Surgeon: Dianna Specking, MD;  Location: [facility];  Service: Endoscopy;;   CARDIAC CATHETERIZATION      COLONOSCOPY     COLONOSCOPY WITH PROPOFOL  N/A 08/26/2019   Procedure: COLONOSCOPY WITH PROPOFOL ;  Surgeon: Dianna Specking, MD;  Location: [facility];  Service: Endoscopy;  Laterality: N/A;   INTRAOPERATIVE TRANSESOPHAGEAL ECHOCARDIOGRAM N/A 03/18/2013   Procedure: INTRAOPERATIVE TRANSESOPHAGEAL ECHOCARDIOGRAM;  Surgeon: Sudie VEAR Laine, MD;  Location: [facility] OR;  Service: Open Heart Surgery;  Laterality: N/A;   LEFT AND RIGHT HEART CATHETERIZATION WITH CORONARY ANGIOGRAM N/A 03/06/2013   Procedure: LEFT AND RIGHT HEART CATHETERIZATION WITH CORONARY ANGIOGRAM;  Surgeon: Victory LELON Claudene DOUGLAS, MD;  Location: [facility] CATH LAB;  Service: Cardiovascular;  Laterality: N/A;   LOOP RECORDER REMOVAL N/A 09/20/2022   Procedure: LOOP RECORDER REMOVAL;  Surgeon: Waddell Danelle LELON, MD;  Location: [facility] INVASIVE CV LAB;  Service: Cardiovascular;  Laterality: N/A;   MAZE N/A 03/18/2013   Procedure: MAZE;  Surgeon: Sudie VEAR Laine, MD;  Location: [facility] OR;  Service: Open Heart Surgery;  Laterality: N/A;   MITRAL VALVE REPAIR Right 03/18/2013   Procedure: MINIMALLY INVASIVE MITRAL VALVE REPAIR (MVR);  Surgeon: Sudie VEAR Laine, MD;  Location: [facility] OR;  Service: Open Heart Surgery;  Laterality: Right;   PACEMAKER IMPLANT N/A 09/20/2022   Procedure: PACEMAKER IMPLANT;  Surgeon: Waddell Danelle LELON, MD;  Location: [facility] INVASIVE CV LAB;  Service: Cardiovascular;  Laterality: N/A;   TRANSESOPHAGEAL ECHOCARDIOGRAM (CATH LAB) N/A 12/25/2023  Procedure: TRANSESOPHAGEAL ECHOCARDIOGRAM;  Surgeon: Loni Soyla LABOR, MD;  Location: [facility] INVASIVE CV LAB;  Service: Cardiovascular;  Laterality: N/A;   VARICOSE VEIN SURGERY     WISDOM TOOTH EXTRACTION     Patient Active Problem List   Diagnosis Date Noted   Presence of heart assist device (HCC) 09/24/2024   TIA (transient ischemic attack) 06/01/2023   Pacemaker 01/18/2023   Dizziness 01/13/2021   Syncope 01/13/2021   Hypotension 09/06/2020   History of colonic polyps 08/26/2019   Hyperglycemia  08/21/2017   Osteopenia 09/23/2016   Need for prophylactic antibiotic 09/23/2016   Tremor, benign 08/08/2016   Hyperlipidemia 08/08/2016   Insomnia 08/08/2016   History of cardiac catheterization 07/18/2016   SVT (supraventricular tachycardia) 07/18/2016   S/P mitral valve repair 03/18/2013   S/P Maze operation for atrial fibrillation 03/18/2013   Paroxysmal atrial fibrillation (HCC) 03/11/2013   History of PSVT (paroxysmal supraventricular tachycardia) 03/03/2013    Class: Chronic    ONSET DATE: 09/24/2024 referral date  REFERRING DIAG:   R25.1 (ICD-10-CM) - Tremor Per referring PCP: Reason for Referral:essential tremor - difficulty doing some ADLs THERAPY DIAG:  Other lack of coordination  Tremor of right hand  Muscle weakness (generalized)  Rationale for Evaluation and Treatment: Rehabilitation  SUBJECTIVE:   SUBJECTIVE STATEMENT: Pt reports that she has been using left hand more, but could still use practice in utilizing L hand more.   Pt accompanied by: self  PERTINENT HISTORY: TIA, dizziness, HLD, chronic PSVT, syncope  PRECAUTIONS: ICD/Pacemaker, fall risk  WEIGHT BEARING RESTRICTIONS: No  PAIN:  Are you having pain? No  FALLS: Has patient fallen in last 6 months? No  LIVING ENVIRONMENT: Lives with: lives with their spouse Lives in: House/apartment 2 level including basement Stairs: Yes: Internal: 13 steps; can reach both and External: 3 steps; can reach both Has following equipment at home: Grab bars  PLOF: Independent  PATIENT GOALS: Reduce tremors in R hand, be able to scoop coffee into filter without spilling  OBJECTIVE:  Note: Objective measures were completed at Evaluation unless otherwise noted.  HAND DOMINANCE: Right  ADLs: Overall ADLs: Independent, uses L hand sometimes Transfers/ambulation related to ADLs: Eating: Unable to eat soup or anything involving scooping, has been using L hand  Grooming: Difficulty with plucking eyebrows  with tweezers UB Dressing: Independent LB Dressing: Independent Toileting: I Bathing: I Tub Shower transfers: I Equipment: Grab bars  IADLs: Shopping: I Light housekeeping: I Meal Prep: Can complete but difficulty d/t using L hand and tremors in R hand Community mobility: I Medication management: I Financial management: I Handwriting: 100% legible and some shakiness   MOBILITY STATUS: Independent  POSTURE COMMENTS:  No Significant postural limitations   ACTIVITY TOLERANCE: Activity tolerance: Not impacted   FUNCTIONAL OUTCOME MEASURES: Upper Extremity Functional Scale (UEFS): 63/80 PSFS:  11/05/24: (first assessment with R hand)  (With use of left hand)   UPPER EXTREMITY ROM:    Active ROM Right eval Left eval  Shoulder flexion    Shoulder abduction    Shoulder adduction    Shoulder extension    Shoulder internal rotation    Shoulder external rotation    Elbow flexion    Elbow extension    Wrist flexion    Wrist extension    Wrist ulnar deviation    Wrist radial deviation    Wrist pronation    Wrist supination    (Blank rows = not tested)  UPPER EXTREMITY MMT:     MMT Right  eval Left eval  Shoulder flexion    Shoulder abduction    Shoulder adduction    Shoulder extension    Shoulder internal rotation    Shoulder external rotation    Middle trapezius    Lower trapezius    Elbow flexion    Elbow extension    Wrist flexion    Wrist extension    Wrist ulnar deviation    Wrist radial deviation    Wrist pronation    Wrist supination    (Blank rows = not tested)  HAND FUNCTION: Grip strength: Right: 48 lbs; Left: 47.67 lbs Right: 55, 47, 47 Left: 50, 46, 47 COORDINATION: 9 Hole Peg test: Right: 36.86 sec; Left: 27.89 sec Box and Blocks:  Right 47blocks, Left 50blocks Tremors: action, Right, and Left (worse in Right) PPT 2 (scooping beans R hand): 45.58 seconds  PPT (doing/undoing buttons): 19.04 seconds SENSATION: WFL  EDEMA:  none   COGNITION: Overall cognitive status: Within functional limits for tasks assessed  VISION: Subjective report: No concerns, visits eye doctor every year  Baseline vision: Wears glasses all the time Visual history: none  VISION ASSESSMENT: WFL  Patient has difficulty with following activities due to following visual impairments: none  PERCEPTION: WFL  PRAXIS: WFL  OBSERVATIONS: Tremors in B hands, worse in R hand. This is affecting FM coordination which impairs ability to complete ADLs/IADLs and ability to go out to eat with family and friends                                                                                                                             TREATMENT DATE:  11/05/24 - Self-care/home management completed for duration as noted below including:  Re-assessed pt's goals, see below for updated measurements. Modifying some goals to focus on LUE usage. Pt educated in benefits of utilizing visual cue for CIMT to promote increased use of L hand for ADLs, for example an oven mitt or brightly colored yarn. Pt stated that's a good idea and agreed to attempt. Pt also encouraged to try to switch roles of B hands when engaging in tasks to utilize B hands, having R hand stabilize and L hand complete coordination tasks. -- Therapeutic activities completed for duration as noted below including:  With use of L hand pt engaged in activity picking up notched pegs and inserting into pegboard.  Patient used L hand to assemble small Jenga tower, and then removed approximately 10 pieces individually until the tower fell over for fine motor coordination of nondominant L hand   10/24/24 Coordination: engaged in rotating pen between all fingers as well as rotating like a baton clockwise and counterclockwise, completing bilaterally. Self-feeding: problem solving scooping and spearing foods with left hand to problem solve positioning of hand on utensil and movement pattern when  bringing food from plate to mouth.  OT providing demonstration and cues for improved positioning of utensils in hand and use of ballet first position hands  to mimic smooth movement of wrist, elbow, and even some shoulder when scooping and bringing utensil to mouth.  Completed with use of putty, beans, and paper towel to simulate various food textures.  Pt demonstrating improved motor control with LUE.  Pt able to utilize RUE with cutting due to hand positioning and stabilizing arm along torso.    Lacing activity: simulated threading needle and needlepoint with use of LUE.  Pt demonstrating improved motor control with threading with LUE while holding lacing card in R hand.  Did attempt with RUE and due to more open chain movements, pt reflecting that she performed better with her LUE.  OT educated on bracing R hand at wrist on table top when threading with L hand to provide increased stability.   10/22/24  Pt engaged in FM coordination activities with weighted braces to determine if any improvement in tremor mgmt, tried 1-2 lbs weights while recreating peg board pattern with small pegs in R hand. Pt reported some improvement but expressed concerns with writing on boards. Tried with paper taped to mirror, little to no improvement. Tried 3 lb weight, some improvement noted. Educated pt in where to obtain. Educated pt in weighted utensils with no built up grip for improved self feeding.   PATIENT EDUCATION: Education details: SEE ABOVE Person educated: Patient Education method: Explanation Education comprehension: verbalized understanding  HOME EXERCISE PROGRAM: 10/15/24: Tremor mgmt, coordination   GOALS: Goals reviewed with patient? Yes  SHORT TERM GOALS: Target date: 10/31/24  Pt will be independent with tremor mgmt strategies Baseline: New to OP OT Goal status: IN PROGRESS  2.  Pt will be independent with HEPs for improved coordination with L hand  Baseline: New to OP OT Goal status:  MODIFIED; FOCUS ON L HAND RATHER THAN R  3.  Pt will be educated in AE for improved completion of ADL/IADL such as grooming and self feeding Baseline: new to OP OT Goal status: IN PROGRESS  4.  Pt will be able to place at least 51 blocks using right hand with completion of Box and Blocks test.  Baseline: Right 47blocks, Left 50 blocks 11/05/24: 49 blocks R, 52  Goal status: DISCONTINUE, PT WISHES TO FOCUS ON L HAND USE   LONG TERM GOALS: Target date: 11/21/24  Pt will demonstrate improved function by increased UEFS score to 70/80 Baseline: 63/80 Goal status: IN PROGRESS  2.  Pt will demonstrate improved function by a PSFS average score increase of at least 2 points, or an individual score increase of at least 3 points Baseline:    11/05/24: Right hand average score 2.3, left hand average score 5.3   Goal status: IN PROGRESS; MODIFY TO FOCUS ON L HAND PSFS SCORE  3.  Pt will demonstrate improved ease with feeding as evidenced by decreasing PPT#2 by at least 3 secs  Baseline: 45.58 seconds  11/05/24: with left hand 22.14 seconds, unable R hand Goal status: MODIFIED; FOCUS ON L HAND USE  4.  Patient will demo improved FM coordination as evidenced by completing nine-hole peg with use of R hand in 32 seconds or less.  Baseline: Right: 36.86 sec; Left: 27.89 sec 11/05/24: Right: 29.67 seconds, Left: 25.97 sec Goal status:MET   ASSESSMENT:  CLINICAL IMPRESSION: Patient is a 68 y.o. female who was seen today for occupational therapy tx for Tremor in R hand. Pt expressing desire to focus on use of LUE as dominant UE.  Pt dmemonstrating improved FM coordination in B hands as evidenced by meeting  LTG 4. Goals have been modified as appropriate to focus on L hand usage. Pt demonstrating good ability and understanding with modifications and receptive to education in use of visual cue for CIMT at home to promote improved usage of L hand. Pt would benefit from continued OT services to improve  ability to complete ADL/IADL and improve quality of life and improve functional use of BUE.  PERFORMANCE DEFICITS: in functional skills including ADLs, IADLs, coordination, dexterity, Fine motor control, and UE functional use and psychosocial skills including coping strategies, environmental adaptation, interpersonal interactions, and routines and behaviors.     PLAN:  OT FREQUENCY: 2x/week  OT DURATION: 6 weeks  PLANNED INTERVENTIONS: 97168 OT Re-evaluation, 97535 self care/ADL training, 02889 therapeutic exercise, 97530 therapeutic activity, passive range of motion, and DME and/or AE instructions  RECOMMENDED OTHER SERVICES: none  CONSULTED AND AGREED WITH PLAN OF CARE: Patient  PLAN FOR NEXT SESSION: Coordination activities F/u HEP Continued AE education CIMT to improve functional use of L hand  Rocky Dutch, OTR/L 11/05/2024, 4:41 PM   [facility] [address] [place], [place] [address] Phone # [phone] Fax # [phone]

## 2024-11-07 ENCOUNTER — Ambulatory Visit: Admitting: Occupational Therapy

## 2024-11-07 DIAGNOSIS — M6281 Muscle weakness (generalized): Secondary | ICD-10-CM

## 2024-11-07 DIAGNOSIS — R278 Other lack of coordination: Secondary | ICD-10-CM

## 2024-11-07 DIAGNOSIS — R251 Tremor, unspecified: Secondary | ICD-10-CM

## 2024-11-07 NOTE — Therapy (Signed)
 " OUTPATIENT OCCUPATIONAL THERAPY NEURO TREATMENT  Patient Name: Ariel Simpson MRN: [id number] DOB:July 27, 1956, 68 y.o., female Today's Date: 11/07/2024  PCP: Geofm Glade PARAS, MD REFERRING PROVIDER: Geofm Glade PARAS, MD  END OF SESSION:  OT End of Session - 11/07/24 0851     Visit Number 6    Number of Visits 13    Date for Recertification  11/21/24    Authorization Type [employer] MCR    Authorization Time Period 10/08/24-11/19/24    Authorization - Visit Number 6    Authorization - Number of Visits 13    Progress Note Due on Visit 10    OT Start Time 0848    OT Stop Time 0930    OT Time Calculation (min) 42 min    Activity Tolerance Patient tolerated treatment well    Behavior During Therapy WFL for tasks assessed/performed             Past Medical History:  Diagnosis Date   Arthritis    Benign essential tremor    CHF (congestive heart failure) (HCC)    Heart murmur    High cholesterol    Hypertension    Insomnia    Mitral regurgitation    Mitral valve prolapse    Osteopenia    Paroxysmal atrial fibrillation (HCC) 03/11/2013   Paroxysmal supraventricular tachycardia    PONV (postoperative nausea and vomiting)    Dizziness and Nausea   S/P Maze operation for atrial fibrillation 03/18/2013   Complete bilateral atrial lesion set using cryothermy and biopolar radiofrequency ablation with oversewing of LA appendage via right mini thoracotomy   S/P mitral valve repair 03/18/2013   Complex valvuloplasty including triangular resection of posterior leaflet, artificial Gore-tex neocord placement x4 and 30mm Sorin Memo 3D ring annuloplasty via right mini thoracotomy   Severe mitral regurgitation 03/03/2013   Acute on chronic with acute heart failure    Shortness of breath    Wears glasses    Past Surgical History:  Procedure Laterality Date   ABDOMINAL HYSTERECTOMY     hernia repair   BIOPSY  08/26/2019   Procedure: BIOPSY;  Surgeon: Dianna Specking, MD;  Location: [facility]  ENDOSCOPY;  Service: Endoscopy;;   CARDIAC CATHETERIZATION     COLONOSCOPY     COLONOSCOPY WITH PROPOFOL  N/A 08/26/2019   Procedure: COLONOSCOPY WITH PROPOFOL ;  Surgeon: Dianna Specking, MD;  Location: [facility] ENDOSCOPY;  Service: Endoscopy;  Laterality: N/A;   INTRAOPERATIVE TRANSESOPHAGEAL ECHOCARDIOGRAM N/A 03/18/2013   Procedure: INTRAOPERATIVE TRANSESOPHAGEAL ECHOCARDIOGRAM;  Surgeon: Sudie VEAR Laine, MD;  Location: [facility] OR;  Service: Open Heart Surgery;  Laterality: N/A;   LEFT AND RIGHT HEART CATHETERIZATION WITH CORONARY ANGIOGRAM N/A 03/06/2013   Procedure: LEFT AND RIGHT HEART CATHETERIZATION WITH CORONARY ANGIOGRAM;  Surgeon: Victory LELON Claudene DOUGLAS, MD;  Location: [facility] CATH LAB;  Service: Cardiovascular;  Laterality: N/A;   LOOP RECORDER REMOVAL N/A 09/20/2022   Procedure: LOOP RECORDER REMOVAL;  Surgeon: Waddell Danelle LELON, MD;  Location: [facility] INVASIVE CV LAB;  Service: Cardiovascular;  Laterality: N/A;   MAZE N/A 03/18/2013   Procedure: MAZE;  Surgeon: Sudie VEAR Laine, MD;  Location: [facility] OR;  Service: Open Heart Surgery;  Laterality: N/A;   MITRAL VALVE REPAIR Right 03/18/2013   Procedure: MINIMALLY INVASIVE MITRAL VALVE REPAIR (MVR);  Surgeon: Sudie VEAR Laine, MD;  Location: [facility] OR;  Service: Open Heart Surgery;  Laterality: Right;   PACEMAKER IMPLANT N/A 09/20/2022   Procedure: PACEMAKER IMPLANT;  Surgeon: Waddell Danelle LELON, MD;  Location: [facility] INVASIVE  CV LAB;  Service: Cardiovascular;  Laterality: N/A;   TRANSESOPHAGEAL ECHOCARDIOGRAM (CATH LAB) N/A 12/25/2023   Procedure: TRANSESOPHAGEAL ECHOCARDIOGRAM;  Surgeon: Loni Soyla LABOR, MD;  Location: [facility] INVASIVE CV LAB;  Service: Cardiovascular;  Laterality: N/A;   VARICOSE VEIN SURGERY     WISDOM TOOTH EXTRACTION     Patient Active Problem List   Diagnosis Date Noted   Presence of heart assist device (HCC) 09/24/2024   TIA (transient ischemic attack) 06/01/2023   Pacemaker 01/18/2023   Dizziness 01/13/2021   Syncope 01/13/2021   Hypotension 09/06/2020    History of colonic polyps 08/26/2019   Hyperglycemia 08/21/2017   Osteopenia 09/23/2016   Need for prophylactic antibiotic 09/23/2016   Tremor, benign 08/08/2016   Hyperlipidemia 08/08/2016   Insomnia 08/08/2016   History of cardiac catheterization 07/18/2016   SVT (supraventricular tachycardia) 07/18/2016   S/P mitral valve repair 03/18/2013   S/P Maze operation for atrial fibrillation 03/18/2013   Paroxysmal atrial fibrillation (HCC) 03/11/2013   History of PSVT (paroxysmal supraventricular tachycardia) 03/03/2013    Class: Chronic    ONSET DATE: 09/24/2024 referral date  REFERRING DIAG:   R25.1 (ICD-10-CM) - Tremor Per referring PCP: Reason for Referral:essential tremor - difficulty doing some ADLs THERAPY DIAG:  Other lack of coordination  Tremor of right hand  Muscle weakness (generalized)  Tremor of left hand  Rationale for Evaluation and Treatment: Rehabilitation  SUBJECTIVE:   SUBJECTIVE STATEMENT: Pt reports that she has been trying to use her left hand more, but notices difficulty when trying to chop vegetables.  Pt reports still using R hand to chop vegetables and use L hand to stabilize tip of knife.    Pt accompanied by: self  PERTINENT HISTORY: TIA, dizziness, HLD, chronic PSVT, syncope  PRECAUTIONS: ICD/Pacemaker, fall risk  WEIGHT BEARING RESTRICTIONS: No  PAIN:  Are you having pain? No  FALLS: Has patient fallen in last 6 months? No  LIVING ENVIRONMENT: Lives with: lives with their spouse Lives in: House/apartment 2 level including basement Stairs: Yes: Internal: 13 steps; can reach both and External: 3 steps; can reach both Has following equipment at home: Grab bars  PLOF: Independent  PATIENT GOALS: Reduce tremors in R hand, be able to scoop coffee into filter without spilling  OBJECTIVE:  Note: Objective measures were completed at Evaluation unless otherwise noted.  HAND DOMINANCE: Right  ADLs: Overall ADLs: Independent, uses L  hand sometimes Transfers/ambulation related to ADLs: Eating: Unable to eat soup or anything involving scooping, has been using L hand  Grooming: Difficulty with plucking eyebrows with tweezers UB Dressing: Independent LB Dressing: Independent Toileting: I Bathing: I Tub Shower transfers: I Equipment: Grab bars  IADLs: Shopping: I Light housekeeping: I Meal Prep: Can complete but difficulty d/t using L hand and tremors in R hand Community mobility: I Medication management: I Financial management: I Handwriting: 100% legible and some shakiness   MOBILITY STATUS: Independent  POSTURE COMMENTS:  No Significant postural limitations   ACTIVITY TOLERANCE: Activity tolerance: Not impacted   FUNCTIONAL OUTCOME MEASURES: Upper Extremity Functional Scale (UEFS): 63/80 PSFS:  11/05/24: (first assessment with R hand)  (With use of left hand)   UPPER EXTREMITY ROM:    Active ROM Right eval Left eval  Shoulder flexion    Shoulder abduction    Shoulder adduction    Shoulder extension    Shoulder internal rotation    Shoulder external rotation    Elbow flexion    Elbow extension    Wrist flexion  Wrist extension    Wrist ulnar deviation    Wrist radial deviation    Wrist pronation    Wrist supination    (Blank rows = not tested)  UPPER EXTREMITY MMT:     MMT Right eval Left eval  Shoulder flexion    Shoulder abduction    Shoulder adduction    Shoulder extension    Shoulder internal rotation    Shoulder external rotation    Middle trapezius    Lower trapezius    Elbow flexion    Elbow extension    Wrist flexion    Wrist extension    Wrist ulnar deviation    Wrist radial deviation    Wrist pronation    Wrist supination    (Blank rows = not tested)  HAND FUNCTION: Grip strength: Right: 48 lbs; Left: 47.67 lbs Right: 55, 47, 47 Left: 50, 46, 47 COORDINATION: 9 Hole Peg test: Right: 36.86 sec; Left: 27.89 sec Box and Blocks:  Right 47blocks, Left  50blocks Tremors: action, Right, and Left (worse in Right) PPT 2 (scooping beans R hand): 45.58 seconds  PPT (doing/undoing buttons): 19.04 seconds SENSATION: WFL  EDEMA: none   COGNITION: Overall cognitive status: Within functional limits for tasks assessed  VISION: Subjective report: No concerns, visits eye doctor every year  Baseline vision: Wears glasses all the time Visual history: none  VISION ASSESSMENT: WFL  Patient has difficulty with following activities due to following visual impairments: none  PERCEPTION: WFL  PRAXIS: WFL  OBSERVATIONS: Tremors in B hands, worse in R hand. This is affecting FM coordination which impairs ability to complete ADLs/IADLs and ability to go out to eat with family and friends                                                                                                                             TREATMENT DATE:  11/07/24 Self-care: Pt reporting that she will move her drink to the Left side of the table to increase ease with bringing cup to mouth and pretty much is eating left handed majority of the time.   Educated on use of adaptive strategies and adaptive equipment to open various jars and containers.  Educated on use of rubber grip pad and use of jar opener.  Pt demonstrating improved ease with opening medium sized jar with jar opener and rubber grip pad.  Pt planning to purchase jar opener set (from [employer]) that included multiple items. Coordination: engaged in building pipe tree puzzle with BUE to focus on functional use of BUE and attempts at defaulting to LUE as primary during functional tasks.  Pt demonstrating use of LUE 60-75% and RUE 25-40% of time. Tremor reduction: engaged in discussion about [employer] Channel and/or Vilim ball as each providing vibration to decrease tremor during functional tasks.  Pt to f/u with her neurologist in regards to benefits vs contraindications with use of vibration in conjunction with pacemaker.  OT  provided pt  with [email or web address] to further research AE, adaptive strategies, and research pertaining to her tremors.   11/05/24 - Self-care/home management completed for duration as noted below including:  Re-assessed pt's goals, see below for updated measurements. Modifying some goals to focus on LUE usage. Pt educated in benefits of utilizing visual cue for CIMT to promote increased use of L hand for ADLs, for example an oven mitt or brightly colored yarn. Pt stated that's a good idea and agreed to attempt. Pt also encouraged to try to switch roles of B hands when engaging in tasks to utilize B hands, having R hand stabilize and L hand complete coordination tasks. -- Therapeutic activities completed for duration as noted below including:  With use of L hand pt engaged in activity picking up notched pegs and inserting into pegboard.  Patient used L hand to assemble small Jenga tower, and then removed approximately 10 pieces individually until the tower fell over for fine motor coordination of nondominant L hand   10/24/24 Coordination: engaged in rotating pen between all fingers as well as rotating like a baton clockwise and counterclockwise, completing bilaterally. Self-feeding: problem solving scooping and spearing foods with left hand to problem solve positioning of hand on utensil and movement pattern when bringing food from plate to mouth.  OT providing demonstration and cues for improved positioning of utensils in hand and use of ballet first position hands to mimic smooth movement of wrist, elbow, and even some shoulder when scooping and bringing utensil to mouth.  Completed with use of putty, beans, and paper towel to simulate various food textures.  Pt demonstrating improved motor control with LUE.  Pt able to utilize RUE with cutting due to hand positioning and stabilizing arm along torso.    Lacing activity: simulated threading needle and needlepoint with use of LUE.  Pt  demonstrating improved motor control with threading with LUE while holding lacing card in R hand.  Did attempt with RUE and due to more open chain movements, pt reflecting that she performed better with her LUE.  OT educated on bracing R hand at wrist on table top when threading with L hand to provide increased stability.   PATIENT EDUCATION: Education details: SEE ABOVE Person educated: Patient Education method: Explanation Education comprehension: verbalized understanding  HOME EXERCISE PROGRAM: 10/15/24: Tremor mgmt, coordination   GOALS: Goals reviewed with patient? Yes  SHORT TERM GOALS: Target date: 10/31/24  Pt will be independent with tremor mgmt strategies Baseline: New to OP OT Goal status: IN PROGRESS  2.  Pt will be independent with HEPs for improved coordination with L hand  Baseline: New to OP OT Goal status: MODIFIED; FOCUS ON L HAND RATHER THAN R  3.  Pt will be educated in AE for improved completion of ADL/IADL such as grooming and self feeding Baseline: new to OP OT Goal status: IN PROGRESS  4.  Pt will be able to place at least 51 blocks using right hand with completion of Box and Blocks test.  Baseline: Right 47blocks, Left 50 blocks 11/05/24: 49 blocks R, 52  Goal status: DISCONTINUE, PT WISHES TO FOCUS ON L HAND USE   LONG TERM GOALS: Target date: 11/21/24  Pt will demonstrate improved function by increased UEFS score to 70/80 Baseline: 63/80 Goal status: IN PROGRESS  2.  Pt will demonstrate improved function by a PSFS average score increase of at least 2 points, or an individual score increase of at least 3 points Baseline:    11/05/24: Right hand  average score 2.3, left hand average score 5.3   Goal status: IN PROGRESS; MODIFY TO FOCUS ON L HAND PSFS SCORE  3.  Pt will demonstrate improved ease with feeding as evidenced by decreasing PPT#2 by at least 3 secs  Baseline: 45.58 seconds  11/05/24: with left hand 22.14 seconds, unable R hand Goal  status: MODIFIED; FOCUS ON L HAND USE  4.  Patient will demo improved FM coordination as evidenced by completing nine-hole peg with use of R hand in 32 seconds or less.  Baseline: Right: 36.86 sec; Left: 27.89 sec 11/05/24: Right: 29.67 seconds, Left: 25.97 sec Goal status:MET   ASSESSMENT:  CLINICAL IMPRESSION: Patient is a 68 y.o. female who was seen today for occupational therapy tx for Tremor in R hand. Pt expressing desire to focus on use of LUE as dominant UE.  Pt continuing to verbalize and demonstrate decreased coordination and strength as needed to open various food containers and jars.  Pt receptive to education on AE and adaptive strategies with pt planning to look into purchasing adaptive jar opener set to aid in ease due to tremors and arthritis.  Pt asking good questions about vibration devices and plans to further discuss with medical provider about safety of use.  Pt would benefit from continued OT services to improve ability to complete ADL/IADL and improve quality of life and improve functional use of BUE.  PERFORMANCE DEFICITS: in functional skills including ADLs, IADLs, coordination, dexterity, Fine motor control, and UE functional use and psychosocial skills including coping strategies, environmental adaptation, interpersonal interactions, and routines and behaviors.     PLAN:  OT FREQUENCY: 2x/week  OT DURATION: 6 weeks  PLANNED INTERVENTIONS: 97168 OT Re-evaluation, 97535 self care/ADL training, 02889 therapeutic exercise, 97530 therapeutic activity, passive range of motion, and DME and/or AE instructions  RECOMMENDED OTHER SERVICES: none  CONSULTED AND AGREED WITH PLAN OF CARE: Patient  PLAN FOR NEXT SESSION: Coordination activities F/u HEP Continued AE education CIMT to improve functional use of L hand  Quanisha Drewry, OTR/L 11/07/2024, 8:51 AM   [facility] [address], [address] [place], [place]  [address] Phone # [phone] Fax # [phone]          "

## 2024-11-10 ENCOUNTER — Encounter: Payer: Self-pay | Admitting: Primary Care

## 2024-11-10 ENCOUNTER — Ambulatory Visit: Admitting: Occupational Therapy

## 2024-11-10 DIAGNOSIS — M6281 Muscle weakness (generalized): Secondary | ICD-10-CM

## 2024-11-10 DIAGNOSIS — R278 Other lack of coordination: Secondary | ICD-10-CM

## 2024-11-10 DIAGNOSIS — R251 Tremor, unspecified: Secondary | ICD-10-CM

## 2024-11-10 NOTE — Therapy (Signed)
 " OUTPATIENT OCCUPATIONAL THERAPY NEURO TREATMENT  Patient Name: Ariel Simpson MRN: [id number] DOB:[birth year], 68 y.o., female Today's Date: 11/10/2024  PCP: Geofm Glade PARAS, MD REFERRING PROVIDER: Geofm Glade PARAS, MD  END OF SESSION:  OT End of Session - 11/10/24 1537     Visit Number 7    Number of Visits 13    Date for Recertification  11/21/24    Authorization Type [employer] MCR    Authorization Time Period 10/08/24-11/19/24    Authorization - Visit Number 7    Authorization - Number of Visits 13    Progress Note Due on Visit 10    OT Start Time 1404    OT Stop Time 1444    OT Time Calculation (min) 40 min    Activity Tolerance Patient tolerated treatment well    Behavior During Therapy WFL for tasks assessed/performed              Past Medical History:  Diagnosis Date   Arthritis    Benign essential tremor    CHF (congestive heart failure) (HCC)    Heart murmur    High cholesterol    Hypertension    Insomnia    Mitral regurgitation    Mitral valve prolapse    Osteopenia    Paroxysmal atrial fibrillation (HCC) 03/11/2013   Paroxysmal supraventricular tachycardia    PONV (postoperative nausea and vomiting)    Dizziness and Nausea   S/P Maze operation for atrial fibrillation 03/18/2013   Complete bilateral atrial lesion set using cryothermy and biopolar radiofrequency ablation with oversewing of LA appendage via right mini thoracotomy   S/P mitral valve repair 03/18/2013   Complex valvuloplasty including triangular resection of posterior leaflet, artificial Gore-tex neocord placement x4 and 30mm Sorin Memo 3D ring annuloplasty via right mini thoracotomy   Severe mitral regurgitation 03/03/2013   Acute on chronic with acute heart failure    Shortness of breath    Wears glasses    Past Surgical History:  Procedure Laterality Date   ABDOMINAL HYSTERECTOMY     hernia repair   BIOPSY  08/26/2019   Procedure: BIOPSY;  Surgeon: Dianna Specking, MD;  Location: [facility]  ENDOSCOPY;  Service: Endoscopy;;   CARDIAC CATHETERIZATION     COLONOSCOPY     COLONOSCOPY WITH PROPOFOL  N/A 08/26/2019   Procedure: COLONOSCOPY WITH PROPOFOL ;  Surgeon: Dianna Specking, MD;  Location: [facility] ENDOSCOPY;  Service: Endoscopy;  Laterality: N/A;   INTRAOPERATIVE TRANSESOPHAGEAL ECHOCARDIOGRAM N/A 03/18/2013   Procedure: INTRAOPERATIVE TRANSESOPHAGEAL ECHOCARDIOGRAM;  Surgeon: Sudie VEAR Laine, MD;  Location: [facility] OR;  Service: Open Heart Surgery;  Laterality: N/A;   LEFT AND RIGHT HEART CATHETERIZATION WITH CORONARY ANGIOGRAM N/A 03/06/2013   Procedure: LEFT AND RIGHT HEART CATHETERIZATION WITH CORONARY ANGIOGRAM;  Surgeon: Victory LELON Claudene DOUGLAS, MD;  Location: [facility] CATH LAB;  Service: Cardiovascular;  Laterality: N/A;   LOOP RECORDER REMOVAL N/A 09/20/2022   Procedure: LOOP RECORDER REMOVAL;  Surgeon: Waddell Danelle LELON, MD;  Location: [facility] INVASIVE CV LAB;  Service: Cardiovascular;  Laterality: N/A;   MAZE N/A 03/18/2013   Procedure: MAZE;  Surgeon: Sudie VEAR Laine, MD;  Location: [facility] OR;  Service: Open Heart Surgery;  Laterality: N/A;   MITRAL VALVE REPAIR Right 03/18/2013   Procedure: MINIMALLY INVASIVE MITRAL VALVE REPAIR (MVR);  Surgeon: Sudie VEAR Laine, MD;  Location: [facility] OR;  Service: Open Heart Surgery;  Laterality: Right;   PACEMAKER IMPLANT N/A 09/20/2022   Procedure: PACEMAKER IMPLANT;  Surgeon: Waddell Danelle LELON, MD;  Location: [facility]  INVASIVE CV LAB;  Service: Cardiovascular;  Laterality: N/A;   TRANSESOPHAGEAL ECHOCARDIOGRAM (CATH LAB) N/A 12/25/2023   Procedure: TRANSESOPHAGEAL ECHOCARDIOGRAM;  Surgeon: Loni Soyla LABOR, MD;  Location: [facility] INVASIVE CV LAB;  Service: Cardiovascular;  Laterality: N/A;   VARICOSE VEIN SURGERY     WISDOM TOOTH EXTRACTION     Patient Active Problem List   Diagnosis Date Noted   Presence of heart assist device (HCC) 09/24/2024   TIA (transient ischemic attack) 06/01/2023   Pacemaker 01/18/2023   Dizziness 01/13/2021   Syncope 01/13/2021   Hypotension 09/06/2020    History of colonic polyps 08/26/2019   Hyperglycemia 08/21/2017   Osteopenia 09/23/2016   Need for prophylactic antibiotic 09/23/2016   Tremor, benign 08/08/2016   Hyperlipidemia 08/08/2016   Insomnia 08/08/2016   History of cardiac catheterization 07/18/2016   SVT (supraventricular tachycardia) 07/18/2016   S/P mitral valve repair 03/18/2013   S/P Maze operation for atrial fibrillation 03/18/2013   Paroxysmal atrial fibrillation (HCC) 03/11/2013   History of PSVT (paroxysmal supraventricular tachycardia) 03/03/2013    Class: Chronic    ONSET DATE: 09/24/2024 referral date  REFERRING DIAG:   R25.1 (ICD-10-CM) - Tremor Per referring PCP: Reason for Referral:essential tremor - difficulty doing some ADLs THERAPY DIAG:  Other lack of coordination  Tremor of right hand  Muscle weakness (generalized)  Tremor of left hand  Rationale for Evaluation and Treatment: Rehabilitation  SUBJECTIVE:   SUBJECTIVE STATEMENT: Pt reports cutting her L thumb at the tip.     Pt accompanied by: self  PERTINENT HISTORY: TIA, dizziness, HLD, chronic PSVT, syncope  PRECAUTIONS: ICD/Pacemaker, fall risk  WEIGHT BEARING RESTRICTIONS: No  PAIN:  Are you having pain? No  FALLS: Has patient fallen in last 6 months? No  LIVING ENVIRONMENT: Lives with: lives with their spouse Lives in: House/apartment 2 level including basement Stairs: Yes: Internal: 13 steps; can reach both and External: 3 steps; can reach both Has following equipment at home: Grab bars  PLOF: Independent  PATIENT GOALS: Reduce tremors in R hand, be able to scoop coffee into filter without spilling  OBJECTIVE:  Note: Objective measures were completed at Evaluation unless otherwise noted.  HAND DOMINANCE: Right  ADLs: Overall ADLs: Independent, uses L hand sometimes Transfers/ambulation related to ADLs: Eating: Unable to eat soup or anything involving scooping, has been using L hand  Grooming: Difficulty with  plucking eyebrows with tweezers UB Dressing: Independent LB Dressing: Independent Toileting: I Bathing: I Tub Shower transfers: I Equipment: Grab bars  IADLs: Shopping: I Light housekeeping: I Meal Prep: Can complete but difficulty d/t using L hand and tremors in R hand Community mobility: I Medication management: I Financial management: I Handwriting: 100% legible and some shakiness   MOBILITY STATUS: Independent  POSTURE COMMENTS:  No Significant postural limitations   ACTIVITY TOLERANCE: Activity tolerance: Not impacted   FUNCTIONAL OUTCOME MEASURES: Upper Extremity Functional Scale (UEFS): 63/80 PSFS:  11/05/24: (first assessment with R hand)  (With use of left hand)   UPPER EXTREMITY ROM:    Active ROM Right eval Left eval  Shoulder flexion    Shoulder abduction    Shoulder adduction    Shoulder extension    Shoulder internal rotation    Shoulder external rotation    Elbow flexion    Elbow extension    Wrist flexion    Wrist extension    Wrist ulnar deviation    Wrist radial deviation    Wrist pronation    Wrist supination    (  Blank rows = not tested)  UPPER EXTREMITY MMT:     MMT Right eval Left eval  Shoulder flexion    Shoulder abduction    Shoulder adduction    Shoulder extension    Shoulder internal rotation    Shoulder external rotation    Middle trapezius    Lower trapezius    Elbow flexion    Elbow extension    Wrist flexion    Wrist extension    Wrist ulnar deviation    Wrist radial deviation    Wrist pronation    Wrist supination    (Blank rows = not tested)  HAND FUNCTION: Grip strength: Right: 48 lbs; Left: 47.67 lbs Right: 55, 47, 47 Left: 50, 46, 47 COORDINATION: 9 Hole Peg test: Right: 36.86 sec; Left: 27.89 sec Box and Blocks:  Right 47blocks, Left 50blocks Tremors: action, Right, and Left (worse in Right) PPT 2 (scooping beans R hand): 45.58 seconds  PPT (doing/undoing buttons): 19.04  seconds SENSATION: WFL  EDEMA: none   COGNITION: Overall cognitive status: Within functional limits for tasks assessed  VISION: Subjective report: No concerns, visits eye doctor every year  Baseline vision: Wears glasses all the time Visual history: none  VISION ASSESSMENT: WFL  Patient has difficulty with following activities due to following visual impairments: none  PERCEPTION: WFL  PRAXIS: WFL  OBSERVATIONS: Tremors in B hands, worse in R hand. This is affecting FM coordination which impairs ability to complete ADLs/IADLs and ability to go out to eat with family and friends                                                                                                                             TREATMENT DATE:  11/10/24 Grip strength: utilized Leisure [employer] Manager with RUE and LUE on level 50# with silver spring. Pt picked up 1 inch blocks with gripper with 3 drops on L when crossing midline and no drops with R when crossing midline.  Pt reporting arthritis in thumb on L impacting ease with task.  OT educating on balance between working on strengthening without exacerbating thumb arthritis/pain.  OT educating on use of stress ball vs rolled towel vs pool noodle with grip strengthening and ringing out towel, with cues to minimize thumb abduction.   Pinch strength: engaged in placing 2# resistance clothespins and cards onto vertical dowel with LUE and then RUE, pt completing bimanual task with improved control with LUE > RUE but able to complete bilaterally.   Self-care: engaged in discussion about routines with scooping spices, cutting and peeling various vegetables, and use of RUE vs LUE as dominant UE.  Discussed scooping vs pouring to increase motor control.     11/07/24 Self-care: Pt reporting that she will move her drink to the Left side of the table to increase ease with bringing cup to mouth and pretty much is eating left handed majority of the time.   Educated on use of  adaptive  strategies and adaptive equipment to open various jars and containers.  Educated on use of rubber grip pad and use of jar opener.  Pt demonstrating improved ease with opening medium sized jar with jar opener and rubber grip pad.  Pt planning to purchase jar opener set (from [employer]) that included multiple items. Coordination: engaged in building pipe tree puzzle with BUE to focus on functional use of BUE and attempts at defaulting to LUE as primary during functional tasks.  Pt demonstrating use of LUE 60-75% and RUE 25-40% of time. Tremor reduction: engaged in discussion about Danley Channel and/or Vilim ball as each providing vibration to decrease tremor during functional tasks.  Pt to f/u with her neurologist in regards to benefits vs contraindications with use of vibration in conjunction with pacemaker.  OT provided pt with [email or web address] to further research AE, adaptive strategies, and research pertaining to her tremors.   11/05/24 - Self-care/home management completed for duration as noted below including:  Re-assessed pt's goals, see below for updated measurements. Modifying some goals to focus on LUE usage. Pt educated in benefits of utilizing visual cue for CIMT to promote increased use of L hand for ADLs, for example an oven mitt or brightly colored yarn. Pt stated that's a good idea and agreed to attempt. Pt also encouraged to try to switch roles of B hands when engaging in tasks to utilize B hands, having R hand stabilize and L hand complete coordination tasks. -- Therapeutic activities completed for duration as noted below including:  With use of L hand pt engaged in activity picking up notched pegs and inserting into pegboard.  Patient used L hand to assemble small Jenga tower, and then removed approximately 10 pieces individually until the tower fell over for fine motor coordination of nondominant L hand   PATIENT EDUCATION: Education details: SEE ABOVE Person educated:  Patient Education method: Explanation Education comprehension: verbalized understanding  HOME EXERCISE PROGRAM: 10/15/24: Tremor mgmt, coordination   GOALS: Goals reviewed with patient? Yes  SHORT TERM GOALS: Target date: 10/31/24  Pt will be independent with tremor mgmt strategies Baseline: New to OP OT Goal status: IN PROGRESS  2.  Pt will be independent with HEPs for improved coordination with L hand  Baseline: New to OP OT Goal status: MODIFIED; FOCUS ON L HAND RATHER THAN R  3.  Pt will be educated in AE for improved completion of ADL/IADL such as grooming and self feeding Baseline: new to OP OT Goal status: IN PROGRESS  4.  Pt will be able to place at least 51 blocks using right hand with completion of Box and Blocks test.  Baseline: Right 47blocks, Left 50 blocks 11/05/24: 49 blocks R, 52  Goal status: DISCONTINUE, PT WISHES TO FOCUS ON L HAND USE   LONG TERM GOALS: Target date: 11/21/24  Pt will demonstrate improved function by increased UEFS score to 70/80 Baseline: 63/80 Goal status: IN PROGRESS  2.  Pt will demonstrate improved function by a PSFS average score increase of at least 2 points, or an individual score increase of at least 3 points Baseline:    11/05/24: Right hand average score 2.3, left hand average score 5.3   Goal status: IN PROGRESS; MODIFY TO FOCUS ON L HAND PSFS SCORE  3.  Pt will demonstrate improved ease with feeding as evidenced by decreasing PPT#2 by at least 3 secs  Baseline: 45.58 seconds  11/05/24: with left hand 22.14 seconds, unable R hand Goal status: MODIFIED; FOCUS ON L HAND  USE  4.  Patient will demo improved FM coordination as evidenced by completing nine-hole peg with use of R hand in 32 seconds or less.  Baseline: Right: 36.86 sec; Left: 27.89 sec 11/05/24: Right: 29.67 seconds, Left: 25.97 sec Goal status:MET   ASSESSMENT:  CLINICAL IMPRESSION: Patient is a 68 y.o. female who was seen today for occupational  therapy tx for Tremor in R hand. Pt expressing desire to focus on use of LUE as dominant UE.  Pt continuing to verbalize and demonstrate decreased coordination and strength as needed to cut, scoop, and open various containers.  Engaged in discussion about RUE vs LUE dominance in various instances and use of gross assist or stabilizing with RUE while attempting with LUE.  Pt would benefit from continued OT services to improve ability to complete ADL/IADL and improve quality of life and improve functional use of BUE.  PERFORMANCE DEFICITS: in functional skills including ADLs, IADLs, coordination, dexterity, Fine motor control, and UE functional use and psychosocial skills including coping strategies, environmental adaptation, interpersonal interactions, and routines and behaviors.     PLAN:  OT FREQUENCY: 2x/week  OT DURATION: 6 weeks  PLANNED INTERVENTIONS: 97168 OT Re-evaluation, 97535 self care/ADL training, 02889 therapeutic exercise, 97530 therapeutic activity, passive range of motion, and DME and/or AE instructions  RECOMMENDED OTHER SERVICES: none  CONSULTED AND AGREED WITH PLAN OF CARE: Patient  PLAN FOR NEXT SESSION: Coordination activities F/u HEP Continued AE education CIMT to improve functional use of L hand  Jauan Wohl, OTR/L 11/10/2024, 3:38 PM   [facility] [address] [place], [place] [address] Phone # [phone] Fax # [phone]          "

## 2024-11-12 ENCOUNTER — Ambulatory Visit

## 2024-11-12 DIAGNOSIS — R251 Tremor, unspecified: Secondary | ICD-10-CM

## 2024-11-12 DIAGNOSIS — M6281 Muscle weakness (generalized): Secondary | ICD-10-CM

## 2024-11-12 DIAGNOSIS — R278 Other lack of coordination: Secondary | ICD-10-CM | POA: Diagnosis not present

## 2024-11-12 NOTE — Therapy (Addendum)
 " OUTPATIENT OCCUPATIONAL THERAPY NEURO TREATMENT  Patient Name: Ariel Simpson MRN: [id number] DOB:1956-02-03, 68 y.o., female Today's Date: 11/12/2024  PCP: Geofm Glade PARAS, MD REFERRING PROVIDER: Geofm Glade PARAS, MD  END OF SESSION:  OT End of Session - 11/12/24 0849     Visit Number 8    Number of Visits 13    Date for Recertification  11/21/24    Authorization Type [employer] MCR    Authorization Time Period 10/08/24-11/19/24    Authorization - Visit Number 8    Authorization - Number of Visits 13    Progress Note Due on Visit 10    OT Start Time 0847    Activity Tolerance Patient tolerated treatment well    Behavior During Therapy [facility] for tasks assessed/performed              Past Medical History:  Diagnosis Date   Arthritis    Benign essential tremor    CHF (congestive heart failure) (HCC)    Heart murmur    High cholesterol    Hypertension    Insomnia    Mitral regurgitation    Mitral valve prolapse    Osteopenia    Paroxysmal atrial fibrillation (HCC) 03/11/2013   Paroxysmal supraventricular tachycardia    PONV (postoperative nausea and vomiting)    Dizziness and Nausea   S/P Maze operation for atrial fibrillation 03/18/2013   Complete bilateral atrial lesion set using cryothermy and biopolar radiofrequency ablation with oversewing of LA appendage via right mini thoracotomy   S/P mitral valve repair 03/18/2013   Complex valvuloplasty including triangular resection of posterior leaflet, artificial Gore-tex neocord placement x4 and 30mm Sorin Memo 3D ring annuloplasty via right mini thoracotomy   Severe mitral regurgitation 03/03/2013   Acute on chronic with acute heart failure    Shortness of breath    Wears glasses    Past Surgical History:  Procedure Laterality Date   ABDOMINAL HYSTERECTOMY     hernia repair   BIOPSY  08/26/2019   Procedure: BIOPSY;  Surgeon: Dianna Specking, MD;  Location: [facility] ENDOSCOPY;  Service: Endoscopy;;   CARDIAC CATHETERIZATION      COLONOSCOPY     COLONOSCOPY WITH PROPOFOL  N/A 08/26/2019   Procedure: COLONOSCOPY WITH PROPOFOL ;  Surgeon: Dianna Specking, MD;  Location: [facility]OS[facility];  Service: Endoscopy;  Laterality: N/A;   INTRAOPERATIVE TRANSESOPHAGEAL ECHOCARDIOGRAM N/A 03/18/2013   Procedure: INTRAOPERATIVE TRANSESOPHAGEAL ECHOCARDIOGRAM;  Surgeon: Sudie VEAR Laine, MD;  Location: [facility] OR;  Service: Open Heart Surgery;  Laterality: N/A;   LEFT AND RIGHT HEART CATHETERIZATION WITH CORONARY ANGIOGRAM N/A 03/06/2013   Procedure: LEFT AND RIGHT HEART CATHETERIZATION WITH CORONARY ANGIOGRAM;  Surgeon: Victory LELON Claudene DOUGLAS, MD;  Location: [facility] CATH LAB;  Service: Cardiovascular;  Laterality: N/A;   LOOP RECORDER REMOVAL N/A 09/20/2022   Procedure: LOOP RECORDER REMOVAL;  Surgeon: Waddell Danelle LELON, MD;  Location: [facility] INVASIVE CV LAB;  Service: Cardiovascular;  Laterality: N/A;   MAZE N/A 03/18/2013   Procedure: MAZE;  Surgeon: Sudie VEAR Laine, MD;  Location: [facility] OR;  Service: Open Heart Surgery;  Laterality: N/A;   MITRAL VALVE REPAIR Right 03/18/2013   Procedure: MINIMALLY INVASIVE MITRAL VALVE REPAIR (MVR);  Surgeon: Sudie VEAR Laine, MD;  Location: [facility] OR;  Service: Open Heart Surgery;  Laterality: Right;   PACEMAKER IMPLANT N/A 09/20/2022   Procedure: PACEMAKER IMPLANT;  Surgeon: Waddell Danelle LELON, MD;  Location: [facility] INVASIVE CV LAB;  Service: Cardiovascular;  Laterality: N/A;   TRANSESOPHAGEAL ECHOCARDIOGRAM (CATH LAB) N/A  12/25/2023   Procedure: TRANSESOPHAGEAL ECHOCARDIOGRAM;  Surgeon: Loni Soyla LABOR, MD;  Location: [facility] INVASIVE CV LAB;  Service: Cardiovascular;  Laterality: N/A;   VARICOSE VEIN SURGERY     WISDOM TOOTH EXTRACTION     Patient Active Problem List   Diagnosis Date Noted   Presence of heart assist device (HCC) 09/24/2024   TIA (transient ischemic attack) 06/01/2023   Pacemaker 01/18/2023   Dizziness 01/13/2021   Syncope 01/13/2021   Hypotension 09/06/2020   History of colonic polyps 08/26/2019   Hyperglycemia  08/21/2017   Osteopenia 09/23/2016   Need for prophylactic antibiotic 09/23/2016   Tremor, benign 08/08/2016   Hyperlipidemia 08/08/2016   Insomnia 08/08/2016   History of cardiac catheterization 07/18/2016   SVT (supraventricular tachycardia) 07/18/2016   S/P mitral valve repair 03/18/2013   S/P Maze operation for atrial fibrillation 03/18/2013   Paroxysmal atrial fibrillation (HCC) 03/11/2013   History of PSVT (paroxysmal supraventricular tachycardia) 03/03/2013    Class: Chronic    ONSET DATE: 09/24/2024 referral date  REFERRING DIAG:   R25.1 (ICD-10-CM) - Tremor Per referring PCP: Reason for Referral:essential tremor - difficulty doing some ADLs THERAPY DIAG:  Other lack of coordination  Tremor of right hand  Muscle weakness (generalized)  Rationale for Evaluation and Treatment: Rehabilitation  SUBJECTIVE:   SUBJECTIVE STATEMENT: Pt reports cutting her L thumb at the tip.     Pt accompanied by: self  PERTINENT HISTORY: TIA, dizziness, HLD, chronic PSVT, syncope  PRECAUTIONS: ICD/Pacemaker, fall risk  WEIGHT BEARING RESTRICTIONS: No  PAIN:  Are you having pain? No  FALLS: Has patient fallen in last 6 months? No  LIVING ENVIRONMENT: Lives with: lives with their spouse Lives in: House/apartment 2 level including basement Stairs: Yes: Internal: 13 steps; can reach both and External: 3 steps; can reach both Has following equipment at home: Grab bars  PLOF: Independent  PATIENT GOALS: Reduce tremors in R hand, be able to scoop coffee into filter without spilling  OBJECTIVE:  Note: Objective measures were completed at Evaluation unless otherwise noted.  HAND DOMINANCE: Right  ADLs: Overall ADLs: Independent, uses L hand sometimes Transfers/ambulation related to ADLs: Eating: Unable to eat soup or anything involving scooping, has been using L hand  Grooming: Difficulty with plucking eyebrows with tweezers UB Dressing: Independent LB Dressing:  Independent Toileting: I Bathing: I Tub Shower transfers: I Equipment: Grab bars  IADLs: Shopping: I Light housekeeping: I Meal Prep: Can complete but difficulty d/t using L hand and tremors in R hand Community mobility: I Medication management: I Financial management: I Handwriting: 100% legible and some shakiness   MOBILITY STATUS: Independent  POSTURE COMMENTS:  No Significant postural limitations   ACTIVITY TOLERANCE: Activity tolerance: Not impacted   FUNCTIONAL OUTCOME MEASURES: Upper Extremity Functional Scale (UEFS): 63/80 PSFS:  11/05/24: (first assessment with R hand)  (With use of left hand)   UPPER EXTREMITY ROM:    Active ROM Right eval Left eval  Shoulder flexion    Shoulder abduction    Shoulder adduction    Shoulder extension    Shoulder internal rotation    Shoulder external rotation    Elbow flexion    Elbow extension    Wrist flexion    Wrist extension    Wrist ulnar deviation    Wrist radial deviation    Wrist pronation    Wrist supination    (Blank rows = not tested)  UPPER EXTREMITY MMT:     MMT Right eval Left eval  Shoulder flexion  Shoulder abduction    Shoulder adduction    Shoulder extension    Shoulder internal rotation    Shoulder external rotation    Middle trapezius    Lower trapezius    Elbow flexion    Elbow extension    Wrist flexion    Wrist extension    Wrist ulnar deviation    Wrist radial deviation    Wrist pronation    Wrist supination    (Blank rows = not tested)  HAND FUNCTION: Grip strength: Right: 48 lbs; Left: 47.67 lbs Right: 55, 47, 47 Left: 50, 46, 47 COORDINATION: 9 Hole Peg test: Right: 36.86 sec; Left: 27.89 sec Box and Blocks:  Right 47blocks, Left 50blocks Tremors: action, Right, and Left (worse in Right) PPT 2 (scooping beans R hand): 45.58 seconds  PPT (doing/undoing buttons): 19.04 seconds SENSATION: WFL  EDEMA: none   COGNITION: Overall cognitive status: Within  functional limits for tasks assessed  VISION: Subjective report: No concerns, visits eye doctor every year  Baseline vision: Wears glasses all the time Visual history: none  VISION ASSESSMENT: WFL  Patient has difficulty with following activities due to following visual impairments: none  PERCEPTION: WFL  PRAXIS: WFL  OBSERVATIONS: Tremors in B hands, worse in R hand. This is affecting FM coordination which impairs ability to complete ADLs/IADLs and ability to go out to eat with family and friends                                                                                                                             TREATMENT DATE:  11/12/24  - Therapeutic activities completed for duration as noted below including: Pt engaged in activities to hone coordination of LUE, including twirling golf balls as instructed in previously issued coordination HEP, and assembling PVC pipe tree. - Self-care/home management completed for duration as noted below including: Pt demonstrated ability to cut food with knife in R hand and fork in L hand with red theraputty.  Pt then completed PPT 2 with standard spoon and L hand, noted improvement, see Goals below.  Pt reports she feels as though she could possibly be discharged soon. Pt agreed to think about it and notify OT next tx session.     11/10/24 Grip strength: utilized Leisure Centre Manager with RUE and LUE on level 50# with silver spring. Pt picked up 1 inch blocks with gripper with 3 drops on L when crossing midline and no drops with R when crossing midline.  Pt reporting arthritis in thumb on L impacting ease with task.  OT educating on balance between working on strengthening without exacerbating thumb arthritis/pain.  OT educating on use of stress ball vs rolled towel vs pool noodle with grip strengthening and ringing out towel, with cues to minimize thumb abduction.   Pinch strength: engaged in placing 2# resistance clothespins and cards onto  vertical dowel with LUE and then RUE, pt completing bimanual task with improved control with LUE >  RUE but able to complete bilaterally.   Self-care: engaged in discussion about routines with scooping spices, cutting and peeling various vegetables, and use of RUE vs LUE as dominant UE.  Discussed scooping vs pouring to increase motor control.     11/07/24 Self-care: Pt reporting that she will move her drink to the Left side of the table to increase ease with bringing cup to mouth and pretty much is eating left handed majority of the time.   Educated on use of adaptive strategies and adaptive equipment to open various jars and containers.  Educated on use of rubber grip pad and use of jar opener.  Pt demonstrating improved ease with opening medium sized jar with jar opener and rubber grip pad.  Pt planning to purchase jar opener set (from [employer]) that included multiple items. Coordination: engaged in building pipe tree puzzle with BUE to focus on functional use of BUE and attempts at defaulting to LUE as primary during functional tasks.  Pt demonstrating use of LUE 60-75% and RUE 25-40% of time. Tremor reduction: engaged in discussion about Danley Channel and/or Vilim ball as each providing vibration to decrease tremor during functional tasks.  Pt to f/u with her neurologist in regards to benefits vs contraindications with use of vibration in conjunction with pacemaker.  OT provided pt with [email or web address] to further research AE, adaptive strategies, and research pertaining to her tremors.    PATIENT EDUCATION: Education details: SEE ABOVE Person educated: Patient Education method: Explanation Education comprehension: verbalized understanding  HOME EXERCISE PROGRAM: 10/15/24: Tremor mgmt, coordination   GOALS: Goals reviewed with patient? Yes  SHORT TERM GOALS: Target date: 10/31/24  Pt will be independent with tremor mgmt strategies Baseline: New to OP OT Goal status: IN PROGRESS  2.   Pt will be independent with HEPs for improved coordination with L hand  Baseline: New to OP OT Goal status: MODIFIED; FOCUS ON L HAND RATHER THAN R  3.  Pt will be educated in AE for improved completion of ADL/IADL such as grooming and self feeding Baseline: new to OP OT Goal status: IN PROGRESS  4.  Pt will be able to place at least 51 blocks using right hand with completion of Box and Blocks test.  Baseline: Right 47blocks, Left 50 blocks 11/05/24: 49 blocks R, 52  Goal status: DISCONTINUE, PT WISHES TO FOCUS ON L HAND USE   LONG TERM GOALS: Target date: 11/21/24  Pt will demonstrate improved function by increased UEFS score to 70/80 Baseline: 63/80 Goal status: IN PROGRESS  2.  Pt will demonstrate improved function by a PSFS average score increase of at least 2 points, or an individual score increase of at least 3 points Baseline:    11/05/24: Right hand average score 2.3, left hand average score 5.3   Goal status: IN PROGRESS; MODIFY TO FOCUS ON L HAND PSFS SCORE  3.  Pt will demonstrate improved ease with feeding as evidenced by decreasing PPT#2 by at least 3 secs  Baseline: 45.58 seconds  11/05/24: with left hand 22.14 seconds, unable R hand 11/12/24: 14.35 seconds L hand Goal status: MET  4.  Patient will demo improved FM coordination as evidenced by completing nine-hole peg with use of R hand in 32 seconds or less.  Baseline: Right: 36.86 sec; Left: 27.89 sec 11/05/24: Right: 29.67 seconds, Left: 25.97 sec Goal status:MET   ASSESSMENT:  CLINICAL IMPRESSION: Patient is a 68 y.o. female who was seen today for occupational therapy tx for Tremor in R  hand. Pt expressing desire to focus on use of LUE as dominant UE.  Pt demonstrating improved use of L hand. Pt would benefit from at least one more tx session and possibly discharge next tx session.   PERFORMANCE DEFICITS: in functional skills including ADLs, IADLs, coordination, dexterity, Fine motor control, and UE  functional use and psychosocial skills including coping strategies, environmental adaptation, interpersonal interactions, and routines and behaviors.     PLAN:  OT FREQUENCY: 2x/week  OT DURATION: 6 weeks  PLANNED INTERVENTIONS: 97168 OT Re-evaluation, 97535 self care/ADL training, 02889 therapeutic exercise, 97530 therapeutic activity, passive range of motion, and DME and/or AE instructions  RECOMMENDED OTHER SERVICES: none  CONSULTED AND AGREED WITH PLAN OF CARE: Patient  PLAN FOR NEXT SESSION: Possible D/c?  Coordination activities F/u HEP Continued AE education CIMT to improve functional use of L hand  Rocky Dutch, OTR/L 11/12/2024, 8:50 AM   [facility] at [facility] [address] [place], [address] [place], [place] [address] Phone # [phone] Fax # [phone]          "

## 2024-11-18 ENCOUNTER — Ambulatory Visit: Attending: Vascular Surgery | Admitting: Vascular Surgery

## 2024-11-18 ENCOUNTER — Other Ambulatory Visit: Payer: Self-pay

## 2024-11-18 VITALS — BP 160/100 | HR 83 | Temp 97.7°F | Resp 19

## 2024-11-18 DIAGNOSIS — J069 Acute upper respiratory infection, unspecified: Secondary | ICD-10-CM | POA: Insufficient documentation

## 2024-11-18 DIAGNOSIS — Z20822 Contact with and (suspected) exposure to covid-19: Secondary | ICD-10-CM | POA: Insufficient documentation

## 2024-11-18 NOTE — Patient Instructions (Signed)
 You were tested for Flu, RSV, COVID-19 today.  Your results will take approximately 24 hours to finalize.  You can view your results as soon as they are finalized on MyChart.  Stay well hydrated by drinking plenty of fluids.You may take tylenol  and ibuprofen as needed per the package instructions for headache, fever, body aches, and pain.You may take over the counter antihistamines as needed for nasal congestion.You may use over the counter Flonase  nasal spray as needed for nasal congestion.You may take over the counter Mucinex (guaifenesin) as needed for chest congestion.  Avoid the DM form.Humidifiers and warm showers can help relieve nasal and chest congestion.Practice good hand hygiene by washing and sanitizing your hands frequently.Please return for evaluation should you experience fevers that are persistent despite taking Motrin or Tylenol , the inability to stay well-hydrated.Should you experience shortness of breath to the point cannot speak in full sentences or chest pain please go to the nearest emergency department.

## 2024-11-18 NOTE — UC Provider Note (Signed)
 Chief Complaint: Chief Complaint Patient presents with  Illness   For the last couple days has had cough, chest congestion, runny nose, ear pressure. Wants viral testing. Afebrile  History provided by: patientHistory limited by: n/aLanguage interpreter used: noHPI:68 y.o. female w hx as noted in chart presents to Urgent Care for 2-3 days of nasal congestion, rhinorrhea, ear fullness, PND with cough .  She has been taking allergy medication and mucinex.  Her husband is here feeling unwell also.  Denies known ill contacts, HA, fever, myalgias, n/v, diarrhea, SOB. VITALS:BP (!) 160/100   Pulse 83   Temp 36.5 C (97.7 F)   Resp 19   SpO2 97% PHYSICAL EXAM:Appearance: Well appearing, no acute distressEyes: no conjunctival injection or drainageEars: Normal TMs and canals bilaterallyNose: mild nasal congestion with clear rhinorrheaMouth/Throat: no significant erythema of oropharynx, tonsils WNL, no petechiae, no exudate, no PTANeck: No cervical LAD, no tenderness, full AROM without painCV: RRR, no murmurPulm: no acute respiratory distress, Lungs clear to auscultation bilaterallySkin: no pallor or rashIMAGING:No results found.EKG not indicatedMEDICAL DECISION MAKING:Assessment / Plan: 68 y.o. female w hx as noted in chart presents to UC with URI symptoms for 3 days.  She is well appearing, her vitals are stable.Differential Diagnosis: Viral URIAcute nasopharyngitisCovidRSVFluOrders / Results: -Viral PCR obtained-Recommend supportive measures which were discussed-Symptoms have been ongoing for more than 48 hours, not a candidate for tamiflu -Return precautions discussed Orders Placed This Encounter  COVID/Influenza A & B/RSV NAAT (PCR) No results found for this visit on 11/18/24.Diagnosis and Disposition:1. Viral URI with cough  Patient Instructions You were tested for Flu, RSV, COVID-19 today.  Your  results will take approximately 24 hours to finalize.  You can view your results as soon as they are finalized on MyChart.  Stay well hydrated by drinking plenty of fluids.You may take tylenol  and ibuprofen as needed per the package instructions for headache, fever, body aches, and pain.You may take over the counter antihistamines as needed for nasal congestion.You may use over the counter Flonase  nasal spray as needed for nasal congestion.You may take over the counter Mucinex (guaifenesin) as needed for chest congestion.  Avoid the DM form.Humidifiers and warm showers can help relieve nasal and chest congestion.Practice good hand hygiene by washing and sanitizing your hands frequently.Please return for evaluation should you experience fevers that are persistent despite taking Motrin or Tylenol , the inability to stay well-hydrated.Should you experience shortness of breath to the point cannot speak in full sentences or chest pain please go to the nearest emergency department.Please follow up with your physician as below:Follow up if symptoms worsen or fail to improve.

## 2024-11-19 ENCOUNTER — Ambulatory Visit: Admitting: Occupational Therapy

## 2024-11-19 DIAGNOSIS — R278 Other lack of coordination: Secondary | ICD-10-CM

## 2024-11-19 DIAGNOSIS — R251 Tremor, unspecified: Secondary | ICD-10-CM

## 2024-11-19 DIAGNOSIS — M6281 Muscle weakness (generalized): Secondary | ICD-10-CM

## 2024-11-19 LAB — COVID/INFLUENZA A & B/RSV NAAT (PCR)
COVID-19 NAAT (PCR): NEGATIVE
Influenza A NAAT (PCR): NEGATIVE
Influenza B NAAT (PCR): NEGATIVE
RSV NAAT (PCR): NEGATIVE

## 2024-11-19 NOTE — Therapy (Signed)
 " OUTPATIENT OCCUPATIONAL THERAPY NEURO TREATMENT  Patient Name: Ariel Simpson MRN: [id number] DOB:01-Dec-1955, 68 y.o., female Today's Date: 11/19/2024  PCP: Geofm Glade PARAS, MD REFERRING PROVIDER: Geofm Glade PARAS, MD   OCCUPATIONAL THERAPY DISCHARGE SUMMARY   Visits from Start of Care: 9   Current functional level related to goals / functional outcomes: Pt has completed OT treatments focused on improving RUE coordination/strength as well as reducing tremors. However, pt has also wanted to focus on improving coordination and functional use of her L nondominant hand to compensate for R hand tremors/deficits.  Remaining deficits: Pt continues to have tremors with R>L but has demonstrated good carryover and ability to use compensatory strategies.    Education / Equipment: Pt has been educated on AE for future use PRN   Patient agrees to discharge. Patient goals were met. Patient is being discharged due to meeting OT goals as well as pt preference as she is able to implement strategies and HEP at home .    END OF SESSION:  OT End of Session - 11/19/24 1239     Visit Number 9    Number of Visits 13    Authorization Type [employer] MCR    Authorization Time Period 10/08/24-11/19/24    OT Start Time 0845    OT Stop Time 0926    OT Time Calculation (min) 41 min    Activity Tolerance Patient tolerated treatment well    Behavior During Therapy WFL for tasks assessed/performed               Past Medical History:  Diagnosis Date   Arthritis    Benign essential tremor    CHF (congestive heart failure) (HCC)    Heart murmur    High cholesterol    Hypertension    Insomnia    Mitral regurgitation    Mitral valve prolapse    Osteopenia    Paroxysmal atrial fibrillation (HCC) 03/11/2013   Paroxysmal supraventricular tachycardia    PONV (postoperative nausea and vomiting)    Dizziness and Nausea   S/P Maze operation for atrial fibrillation 03/18/2013   Complete bilateral atrial  lesion set using cryothermy and biopolar radiofrequency ablation with oversewing of LA appendage via right mini thoracotomy   S/P mitral valve repair 03/18/2013   Complex valvuloplasty including triangular resection of posterior leaflet, artificial Gore-tex neocord placement x4 and 30mm Sorin Memo 3D ring annuloplasty via right mini thoracotomy   Severe mitral regurgitation 03/03/2013   Acute on chronic with acute heart failure    Shortness of breath    Wears glasses    Past Surgical History:  Procedure Laterality Date   ABDOMINAL HYSTERECTOMY     hernia repair   BIOPSY  08/26/2019   Procedure: BIOPSY;  Surgeon: Dianna Specking, MD;  Location: [facility];  Service: Endoscopy;;   CARDIAC CATHETERIZATION     COLONOSCOPY     COLONOSCOPY WITH PROPOFOL  N/A 08/26/2019   Procedure: COLONOSCOPY WITH PROPOFOL ;  Surgeon: Dianna Specking, MD;  Location: [facility];  Service: Endoscopy;  Laterality: N/A;   INTRAOPERATIVE TRANSESOPHAGEAL ECHOCARDIOGRAM N/A 03/18/2013   Procedure: INTRAOPERATIVE TRANSESOPHAGEAL ECHOCARDIOGRAM;  Surgeon: Sudie VEAR Laine, MD;  Location: [facility] OR;  Service: Open Heart Surgery;  Laterality: N/A;   LEFT AND RIGHT HEART CATHETERIZATION WITH CORONARY ANGIOGRAM N/A 03/06/2013   Procedure: LEFT AND RIGHT HEART CATHETERIZATION WITH CORONARY ANGIOGRAM;  Surgeon: Victory LELON Claudene DOUGLAS, MD;  Location: [facility] CATH LAB;  Service: Cardiovascular;  Laterality: N/A;   LOOP RECORDER REMOVAL N/A  09/20/2022   Procedure: LOOP RECORDER REMOVAL;  Surgeon: Waddell Danelle ORN, MD;  Location: [facility] INVASIVE CV LAB;  Service: Cardiovascular;  Laterality: N/A;   MAZE N/A 03/18/2013   Procedure: MAZE;  Surgeon: Sudie VEAR Laine, MD;  Location: [facility] OR;  Service: Open Heart Surgery;  Laterality: N/A;   MITRAL VALVE REPAIR Right 03/18/2013   Procedure: MINIMALLY INVASIVE MITRAL VALVE REPAIR (MVR);  Surgeon: Sudie VEAR Laine, MD;  Location: [facility] OR;  Service: Open Heart Surgery;  Laterality: Right;   PACEMAKER IMPLANT N/A  09/20/2022   Procedure: PACEMAKER IMPLANT;  Surgeon: Waddell Danelle ORN, MD;  Location: [facility] INVASIVE CV LAB;  Service: Cardiovascular;  Laterality: N/A;   TRANSESOPHAGEAL ECHOCARDIOGRAM (CATH LAB) N/A 12/25/2023   Procedure: TRANSESOPHAGEAL ECHOCARDIOGRAM;  Surgeon: Loni Soyla LABOR, MD;  Location: [facility] INVASIVE CV LAB;  Service: Cardiovascular;  Laterality: N/A;   VARICOSE VEIN SURGERY     WISDOM TOOTH EXTRACTION     Patient Active Problem List   Diagnosis Date Noted   Presence of heart assist device (HCC) 09/24/2024   TIA (transient ischemic attack) 06/01/2023   Pacemaker 01/18/2023   Dizziness 01/13/2021   Syncope 01/13/2021   Hypotension 09/06/2020   History of colonic polyps 08/26/2019   Hyperglycemia 08/21/2017   Osteopenia 09/23/2016   Need for prophylactic antibiotic 09/23/2016   Tremor, benign 08/08/2016   Hyperlipidemia 08/08/2016   Insomnia 08/08/2016   History of cardiac catheterization 07/18/2016   SVT (supraventricular tachycardia) 07/18/2016   S/P mitral valve repair 03/18/2013   S/P Maze operation for atrial fibrillation 03/18/2013   Paroxysmal atrial fibrillation (HCC) 03/11/2013   History of PSVT (paroxysmal supraventricular tachycardia) 03/03/2013    Class: Chronic    ONSET DATE: 09/24/2024 referral date  REFERRING DIAG:   R25.1 (ICD-10-CM) - Tremor Per referring PCP: Reason for Referral:essential tremor - difficulty doing some ADLs THERAPY DIAG:  Other lack of coordination  Tremor of right hand  Muscle weakness (generalized)  Tremor of left hand  Rationale for Evaluation and Treatment: Rehabilitation  SUBJECTIVE:   SUBJECTIVE STATEMENT: Pt kindly requesting to DC this date as she feels confident to utilize strategies from OT sessions and continue working at home.    Pt accompanied by: self  PERTINENT HISTORY: TIA, dizziness, HLD, chronic PSVT, syncope  PRECAUTIONS: ICD/Pacemaker, fall risk  WEIGHT BEARING RESTRICTIONS: No  PAIN:  Are you  having pain? No  FALLS: Has patient fallen in last 6 months? No  LIVING ENVIRONMENT: Lives with: lives with their spouse Lives in: House/apartment 2 level including basement Stairs: Yes: Internal: 13 steps; can reach both and External: 3 steps; can reach both Has following equipment at home: Grab bars  PLOF: Independent  PATIENT GOALS: Reduce tremors in R hand, be able to scoop coffee into filter without spilling  OBJECTIVE:  Note: Objective measures were completed at Evaluation unless otherwise noted.  HAND DOMINANCE: Right  ADLs: Overall ADLs: Independent, uses L hand sometimes Transfers/ambulation related to ADLs: Eating: Unable to eat soup or anything involving scooping, has been using L hand  Grooming: Difficulty with plucking eyebrows with tweezers UB Dressing: Independent LB Dressing: Independent Toileting: I Bathing: I Tub Shower transfers: I Equipment: Grab bars  IADLs: Shopping: I Light housekeeping: I Meal Prep: Can complete but difficulty d/t using L hand and tremors in R hand Community mobility: I Medication management: I Financial management: I Handwriting: 100% legible and some shakiness   MOBILITY STATUS: Independent  POSTURE COMMENTS:  No Significant postural limitations   ACTIVITY TOLERANCE:  Activity tolerance: Not impacted   FUNCTIONAL OUTCOME MEASURES: Upper Extremity Functional Scale (UEFS): 63/80 PSFS:  11/05/24: (first assessment with R hand)  (With use of left hand)   UPPER EXTREMITY ROM:    Active ROM Right eval Left eval  Shoulder flexion    Shoulder abduction    Shoulder adduction    Shoulder extension    Shoulder internal rotation    Shoulder external rotation    Elbow flexion    Elbow extension    Wrist flexion    Wrist extension    Wrist ulnar deviation    Wrist radial deviation    Wrist pronation    Wrist supination    (Blank rows = not tested)  UPPER EXTREMITY MMT:     MMT Right eval Left eval  Shoulder  flexion    Shoulder abduction    Shoulder adduction    Shoulder extension    Shoulder internal rotation    Shoulder external rotation    Middle trapezius    Lower trapezius    Elbow flexion    Elbow extension    Wrist flexion    Wrist extension    Wrist ulnar deviation    Wrist radial deviation    Wrist pronation    Wrist supination    (Blank rows = not tested)  HAND FUNCTION: Grip strength: Right: 48 lbs; Left: 47.67 lbs Right: 55, 47, 47.  Reassessed 12/31 at 55# Left: 50, 46, 47. Reassessed 12/31 at 53# COORDINATION: 9 Hole Peg test: Right: 36.86 sec; Left: 27.89 sec Box and Blocks:  Right 47blocks, Left 50blocks Tremors: action, Right, and Left (worse in Right) Reassessed 9 hole peg test on 12/31 and R hand: 30.85 seconds, L hand: 23 seconds. PPT 2 (scooping beans R hand): 45.58 seconds  PPT (doing/undoing buttons): 19.04 seconds SENSATION: WFL  EDEMA: none   COGNITION: Overall cognitive status: Within functional limits for tasks assessed  VISION: Subjective report: No concerns, visits eye doctor every year  Baseline vision: Wears glasses all the time Visual history: none  VISION ASSESSMENT: WFL  Patient has difficulty with following activities due to following visual impairments: none  PERCEPTION: WFL  PRAXIS: WFL  OBSERVATIONS: Tremors in B hands, worse in R hand. This is affecting FM coordination which impairs ability to complete ADLs/IADLs and ability to go out to eat with family and friends                                                                                                                             TREATMENT DATE:  11/19/24 Initial focus on DC planning as pt kindly requesting to DC this date after today's session as she feels confident to utilize strategies and HEP that have been issued and trained. Measurements taken for DC, see above and below for details. Therapeutic discussion regarding strategies and techniques for reducing  tremors for body proximity, stabilization techniques, as well as education on AE as potentially needed in  the future.  [facility] training for L hand this date: reviewing multuple playing cards tasks for flipping, dealing, shuffling, etc as well as stacking small items such as coins, threading paperclips, and other small household items to improve function of LUE.   11/12/24  - Therapeutic activities completed for duration as noted below including: Pt engaged in activities to hone coordination of LUE, including twirling golf balls as instructed in previously issued coordination HEP, and assembling PVC pipe tree. - Self-care/home management completed for duration as noted below including: Pt demonstrated ability to cut food with knife in R hand and fork in L hand with red theraputty.  Pt then completed PPT 2 with standard spoon and L hand, noted improvement, see Goals below.  Pt reports she feels as though she could possibly be discharged soon. Pt agreed to think about it and notify OT next tx session.     11/10/24 Grip strength: utilized [employer] with RUE and LUE on level 50# with silver spring. Pt picked up 1 inch blocks with gripper with 3 drops on L when crossing midline and no drops with R when crossing midline.  Pt reporting arthritis in thumb on L impacting ease with task.  OT educating on balance between working on strengthening without exacerbating thumb arthritis/pain.  OT educating on use of stress ball vs rolled towel vs pool noodle with grip strengthening and ringing out towel, with cues to minimize thumb abduction.   Pinch strength: engaged in placing 2# resistance clothespins and cards onto vertical dowel with LUE and then RUE, pt completing bimanual task with improved control with LUE > RUE but able to complete bilaterally.   Self-care: engaged in discussion about routines with scooping spices, cutting and peeling various vegetables, and use of RUE vs LUE as dominant UE.  Discussed  scooping vs pouring to increase motor control.        PATIENT EDUCATION: Education details: SEE ABOVE Person educated: Patient Education method: Explanation Education comprehension: verbalized understanding  HOME EXERCISE PROGRAM: 10/15/24: Tremor mgmt, coordination   GOALS: Goals reviewed with patient? Yes  SHORT TERM GOALS: Target date: 10/31/24  Pt will be independent with tremor mgmt strategies Baseline: New to OP OT Goal status: MET  2.  Pt will be independent with HEPs for improved coordination with L hand  Baseline: New to OP OT Goal status: MET, focusing on L hand over R  3.  Pt will be educated in AE for improved completion of ADL/IADL such as grooming and self feeding Baseline: new to OP OT Goal status: Pt educated on AE, utensils, etc.   4.  Pt will be able to place at least 51 blocks using right hand with completion of Box and Blocks test.  Baseline: Right 47blocks, Left 50 blocks 11/05/24: 49 blocks R, 52  Goal status: DISCONTINUE, PT WISHES TO FOCUS ON L HAND USE   LONG TERM GOALS: Target date: 11/21/24  Pt will demonstrate improved function by increased UEFS score to 70/80 Baseline: 63/80 Goal status: IN PROGRESS  2.  Pt will demonstrate improved function by a PSFS average score increase of at least 2 points, or an individual score increase of at least 3 points Baseline:    11/05/24: Right hand average score 2.3, left hand average score 5.3   Goal status: IN PROGRESS; MODIFY TO FOCUS ON L HAND PSFS SCORE  3.  Pt will demonstrate improved ease with feeding as evidenced by decreasing PPT#2 by at least 3 secs  Baseline: 45.58 seconds  11/05/24: with left hand 22.14 seconds, unable R hand 11/12/24: 14.35 seconds L hand Goal status: MET  4.  Patient will demo improved FM coordination as evidenced by completing nine-hole peg with use of R hand in 32 seconds or less.  Baseline: Right: 36.86 sec; Left: 27.89 sec 11/05/24: Right: 29.67 seconds, Left:  25.97 sec 12/31    R hand: 30.85 sec, L hand: 23 sec Goal status:MET   ASSESSMENT:  CLINICAL IMPRESSION: Patient is a 67 y.o. female who was seen today for occupational therapy tx for Tremor in R hand and focusing on compensating with L hand per request. Pt demonstrating improved use of L hand. Pt DC this date per request and as pt has met most of her OT goals and is able to continue HEP at home.  PERFORMANCE DEFICITS: in functional skills including ADLs, IADLs, coordination, dexterity, Fine motor control, and UE functional use and psychosocial skills including coping strategies, environmental adaptation, interpersonal interactions, and routines and behaviors.     PLAN:  OT FREQUENCY: 2x/week  OT DURATION: 6 weeks  PLANNED INTERVENTIONS: 97168 OT Re-evaluation, 97535 self care/ADL training, 02889 therapeutic exercise, 97530 therapeutic activity, passive range of motion, and DME and/or AE instructions  RECOMMENDED OTHER SERVICES: none  CONSULTED AND AGREED WITH PLAN OF CARE: Patient  PLAN FOR NEXT SESSION: Possible D/c?  Coordination activities F/u HEP Continued AE education CIMT to improve functional use of L hand  Chiquita JAYSON Hopping, OTR/L 11/19/2024, 12:41 PM   [facility] [address] [place], [address] [place], [place] [address] Phone # [phone] Fax # [phone]          "

## 2024-11-21 ENCOUNTER — Ambulatory Visit

## 2024-11-21 ENCOUNTER — Other Ambulatory Visit: Payer: Self-pay | Admitting: Gastroenterology

## 2024-11-21 ENCOUNTER — Ambulatory Visit: Admitting: Occupational Therapy

## 2024-11-21 ENCOUNTER — Encounter: Payer: Self-pay | Admitting: Cardiovascular Disease

## 2024-11-21 ENCOUNTER — Telehealth (HOSPITAL_BASED_OUTPATIENT_CLINIC_OR_DEPARTMENT_OTHER): Payer: Self-pay | Admitting: *Deleted

## 2024-11-21 VITALS — Ht 65.5 in | Wt 166.0 lb

## 2024-11-21 DIAGNOSIS — Z0000 Encounter for general adult medical examination without abnormal findings: Secondary | ICD-10-CM | POA: Diagnosis not present

## 2024-11-21 NOTE — Patient Instructions (Addendum)
 Ariel Simpson,  Thank you for taking the time for your Medicare Wellness Visit. I appreciate your continued commitment to your health goals. Please review the care plan we discussed, and feel free to reach out if I can assist you further.  Please note that Annual Wellness Visits do not include a physical exam. Some assessments may be limited, especially if the visit was conducted virtually. If needed, we may recommend an in-person follow-up with your provider.  Ongoing Care Seeing your primary care provider every 3 to 6 months helps us  monitor your health and provide consistent, personalized care. Last office visit on 09/24/2024.  Keep up the good work.  Referrals If a referral was made during today's visit and you haven't received any updates within two weeks, please contact the referred provider directly to check on the status.  Recommended Screenings:  Health Maintenance  Topic Date Due   COVID-19 Vaccine (5 - 2025-26 season) 07/21/2024   Colon Cancer Screening  08/25/2024   Medicare Annual Wellness Visit  10/25/2024   Pneumococcal Vaccine for age over 41 (1 of 2 - PCV) 09/24/2025*   Osteoporosis screening with Bone Density Scan  03/22/2025   Breast Cancer Screening  03/28/2026   DTaP/Tdap/Td vaccine (3 - Td or Tdap) 01/15/2033   Flu Shot  Completed   Hepatitis C Screening  Completed   Meningitis B Vaccine  Aged Out   Zoster (Shingles) Vaccine  Discontinued  *Topic was postponed. The date shown is not the original due date.       11/20/2024    5:06 PM  Advanced Directives  Does Patient Have a Medical Advance Directive? Yes  Type of Estate Agent of [place];Living will;Out of facility DNR (pink MOST or yellow form)  Copy of Healthcare Power of Attorney in Chart? No - copy requested    Vision: Annual vision screenings are recommended for early detection of glaucoma, cataracts, and diabetic retinopathy. These exams can also reveal signs of chronic conditions  such as diabetes and high blood pressure.  Dental: Annual dental screenings help detect early signs of oral cancer, gum disease, and other conditions linked to overall health, including heart disease and diabetes.  Please see the attached documents for additional preventive care recommendations.

## 2024-11-21 NOTE — Progress Notes (Signed)
 "  Chief Complaint  Patient presents with   Medicare Wellness     Subjective:   Ariel Simpson is a 69 y.o. female who presents for a Medicare Annual Wellness Visit.  Visit info / Clinical Intake: Medicare Wellness Visit Type:: Subsequent Annual Wellness Visit Persons participating in visit and providing information:: patient Medicare Wellness Visit Mode:: Telephone If telephone:: video declined Since this visit was completed virtually, some vitals may be partially provided or unavailable. Missing vitals are due to the limitations of the virtual format.: Documented vitals are patient reported If Telephone or Video please confirm:: I connected with patient using audio/video enable telemedicine. I verified patient identity with two identifiers, discussed telehealth limitations, and patient agreed to proceed. Patient Location:: Home Provider Location:: Home Interpreter Needed?: No Pre-visit prep was completed: yes AWV questionnaire completed by patient prior to visit?: yes Date:: 11/20/24 Living arrangements:: (Patient-Rptd) lives with spouse/significant other Patient's Overall Health Status Rating: (Patient-Rptd) good Typical amount of pain: (Patient-Rptd) none Does pain affect daily life?: (Patient-Rptd) no Are you currently prescribed opioids?: no  Dietary Habits and Nutritional Risks How many meals a day?: (Patient-Rptd) 3 Eats fruit and vegetables daily?: (Patient-Rptd) yes Most meals are obtained by: (Patient-Rptd) preparing own meals In the last 2 weeks, have you had any of the following?: none Diabetic:: no  Functional Status Activities of Daily Living (to include ambulation/medication): (Patient-Rptd) Independent Ambulation: Independent with device- listed below Home Assistive Devices/Equipment: Eyeglasses Medication Administration: (Patient-Rptd) Independent Home Management (perform basic housework or laundry): (Patient-Rptd) Independent Manage your own finances?:  (Patient-Rptd) yes Primary transportation is: (Patient-Rptd) driving Concerns about vision?: no *vision screening is required for WTM*  Fall Screening Falls in the past year?: (Patient-Rptd) 0 Number of falls in past year: 0 Was there an injury with Fall?: 0 Fall Risk Category Calculator: 0 Patient Fall Risk Level: Low Fall Risk  Fall Risk Patient at Risk for Falls Due to: No Fall Risks Fall risk Follow up: Falls evaluation completed; Falls prevention discussed  Home and Transportation Safety: All rugs have non-skid backing?: (Patient-Rptd) yes All stairs or steps have railings?: (Patient-Rptd) yes Grab bars in the bathtub or shower?: (Patient-Rptd) yes Have non-skid surface in bathtub or shower?: (Patient-Rptd) yes Good home lighting?: (Patient-Rptd) yes Regular seat belt use?: (Patient-Rptd) yes Hospital stays in the last year:: (Patient-Rptd) no  Cognitive Assessment Difficulty concentrating, remembering, or making decisions? : (Patient-Rptd) no Will 6CIT or Mini Cog be Completed: no 6CIT or Mini Cog Declined: patient alert, oriented, able to answer questions appropriately and recall recent events  Advance Directives (For Healthcare) Does Patient Have a Medical Advance Directive?: Yes Type of Advance Directive: Healthcare Power of [place]; Living will; Out of facility DNR (pink MOST or yellow form) Copy of Healthcare Power of Attorney in Chart?: No - copy requested Copy of Living Will in Chart?: No - copy requested Out of facility DNR (pink MOST or yellow form) in Chart? (Ambulatory ONLY): No - copy requested  Reviewed/Updated  Reviewed/Updated: Reviewed All (Medical, Surgical, Family, Medications, Allergies, Care Teams, Patient Goals)    Allergies (verified) Patient has no known allergies.   Current Medications (verified) Outpatient Encounter Medications as of 11/21/2024  Medication Sig   amLODipine  (NORVASC ) 5 MG tablet Take 1 tablet (5 mg total) by mouth daily.    amoxicillin  (AMOXIL ) 500 MG capsule TAKE 4 CAPSULES BY MOUTH 1 HOUR BEFORE DENTAL APPOINTMENT   aspirin  81 MG tablet Take 1 tablet (81 mg total) by mouth daily.   atorvastatin  (LIPITOR)  20 MG tablet Take 1 tablet (20 mg total) by mouth daily.   BIOTIN PO Place 1 patch onto the skin daily.   furosemide  (LASIX ) 20 MG tablet Take 1 tablet (20 mg total) by mouth daily as needed (shortness of breath or swelling).   losartan  (COZAAR ) 50 MG tablet Take 1 tablet (50 mg total) by mouth 2 (two) times daily.   metoprolol  succinate (TOPROL  XL) 25 MG 24 hr tablet Take 1 tablet (25 mg total) by mouth 2 (two) times daily.   Omega-3 Fatty Acids (FISH OIL PO) Take 2 capsules by mouth daily.   polyethylene glycol (MIRALAX  / GLYCOLAX ) packet Take 17 g by mouth daily.   Propylene Glycol, PF, (SYSTANE COMPLETE PF) 0.6 % SOLN Place 1 drop into both eyes as needed (dry eyes).   TURMERIC PO Take 1 tablet by mouth in the morning and at bedtime.   zolpidem  (AMBIEN ) 5 MG tablet TAKE 1/2 TABLET(2.5 MG) BY MOUTH AT BEDTIME AS NEEDED FOR SLEEP   Glucosamine Sulfate-MSM (MSM-GLUCOSAMINE EX) Place 1 patch onto the skin daily.   No facility-administered encounter medications on file as of 11/21/2024.    History: Past Medical History:  Diagnosis Date   Arthritis    Benign essential tremor    CHF (congestive heart failure) (HCC)    Heart murmur    High cholesterol    Hypertension    Insomnia    Mitral regurgitation    Mitral valve prolapse    Osteopenia    Paroxysmal atrial fibrillation (HCC) 03/11/2013   Paroxysmal supraventricular tachycardia    PONV (postoperative nausea and vomiting)    Dizziness and Nausea   S/P Maze operation for atrial fibrillation 03/18/2013   Complete bilateral atrial lesion set using cryothermy and biopolar radiofrequency ablation with oversewing of LA appendage via right mini thoracotomy   S/P mitral valve repair 03/18/2013   Complex valvuloplasty including triangular resection of posterior  leaflet, artificial Gore-tex neocord placement x4 and 30mm Sorin Memo 3D ring annuloplasty via right mini thoracotomy   Severe mitral regurgitation 03/03/2013   Acute on chronic with acute heart failure    Shortness of breath    Wears glasses    Past Surgical History:  Procedure Laterality Date   ABDOMINAL HYSTERECTOMY     hernia repair   BIOPSY  08/26/2019   Procedure: BIOPSY;  Surgeon: Dianna Specking, MD;  Location: [facility];  Service: Endoscopy;;   CARDIAC CATHETERIZATION     COLONOSCOPY     COLONOSCOPY WITH PROPOFOL  N/A 08/26/2019   Procedure: COLONOSCOPY WITH PROPOFOL ;  Surgeon: Dianna Specking, MD;  Location: [facility];  Service: Endoscopy;  Laterality: N/A;   INTRAOPERATIVE TRANSESOPHAGEAL ECHOCARDIOGRAM N/A 03/18/2013   Procedure: INTRAOPERATIVE TRANSESOPHAGEAL ECHOCARDIOGRAM;  Surgeon: Sudie VEAR Laine, MD;  Location: [facility] OR;  Service: Open Heart Surgery;  Laterality: N/A;   LEFT AND RIGHT HEART CATHETERIZATION WITH CORONARY ANGIOGRAM N/A 03/06/2013   Procedure: LEFT AND RIGHT HEART CATHETERIZATION WITH CORONARY ANGIOGRAM;  Surgeon: Victory LELON Claudene DOUGLAS, MD;  Location: [facility] CATH LAB;  Service: Cardiovascular;  Laterality: N/A;   LOOP RECORDER REMOVAL N/A 09/20/2022   Procedure: LOOP RECORDER REMOVAL;  Surgeon: Waddell Danelle LELON, MD;  Location: [facility] INVASIVE CV LAB;  Service: Cardiovascular;  Laterality: N/A;   MAZE N/A 03/18/2013   Procedure: MAZE;  Surgeon: Sudie VEAR Laine, MD;  Location: [facility] OR;  Service: Open Heart Surgery;  Laterality: N/A;   MITRAL VALVE REPAIR Right 03/18/2013   Procedure: MINIMALLY INVASIVE MITRAL VALVE REPAIR (MVR);  Surgeon: Sudie VEAR Laine, MD;  Location: [facility] OR;  Service: Open Heart Surgery;  Laterality: Right;   PACEMAKER IMPLANT N/A 09/20/2022   Procedure: PACEMAKER IMPLANT;  Surgeon: Waddell Danelle ORN, MD;  Location: [facility] INVASIVE CV LAB;  Service: Cardiovascular;  Laterality: N/A;   TRANSESOPHAGEAL ECHOCARDIOGRAM (CATH LAB) N/A 12/25/2023   Procedure:  TRANSESOPHAGEAL ECHOCARDIOGRAM;  Surgeon: Loni Soyla LABOR, MD;  Location: [facility] INVASIVE CV LAB;  Service: Cardiovascular;  Laterality: N/A;   VARICOSE VEIN SURGERY     WISDOM TOOTH EXTRACTION     Family History  Problem Relation Age of Onset   Pancreatic cancer Mother    Stomach cancer Father    Hypertension Father    Thyroid  cancer Sister    Colon cancer Paternal Grandmother    Stomach cancer Paternal Grandfather    Seizures Child    Down syndrome Child    Social History   Occupational History   Occupation: Retired    Associate Professor: [employer]  Tobacco Use   Smoking status: Never   Smokeless tobacco: Never  Vaping Use   Vaping status: Never Used  Substance and Sexual Activity   Alcohol use: Yes    Alcohol/week: 5.0 - 6.0 standard drinks of alcohol    Types: 5 - 6 Glasses of wine per week    Comment: 1 drink/day   Drug use: No   Sexual activity: Yes    Birth control/protection: Surgical   Tobacco Counseling Counseling given: Not Answered  SDOH Screenings   Food Insecurity: No Food Insecurity (11/21/2024)  Housing: Unknown (11/21/2024)  Transportation Needs: No Transportation Needs (11/21/2024)  Utilities: Not At Risk (11/21/2024)  Alcohol Screen: Low Risk (09/01/2024)  Depression (PHQ2-9): Low Risk (11/21/2024)  Financial Resource Strain: Low Risk (09/01/2024)  Physical Activity: Sufficiently Active (11/21/2024)  Social Connections: Moderately Isolated (11/21/2024)  Stress: No Stress Concern Present (11/21/2024)  Tobacco Use: Low Risk (11/21/2024)  Health Literacy: Adequate Health Literacy (11/21/2024)   See flowsheets for full screening details  Depression Screen PHQ 2 & 9 Depression Scale- Over the past 2 weeks, how often have you been bothered by any of the following problems? Little interest or pleasure in doing things: 0 Feeling down, depressed, or hopeless (PHQ Adolescent also includes...irritable): 0 PHQ-2 Total Score: 0 Trouble falling or staying asleep, or  sleeping too much: 0 Feeling tired or having little energy: 0 Poor appetite or overeating (PHQ Adolescent also includes...weight loss): 0 Feeling bad about yourself - or that you are a failure or have let yourself or your family down: 0 Trouble concentrating on things, such as reading the newspaper or watching television (PHQ Adolescent also includes...like school work): 0 Moving or speaking so slowly that other people could have noticed. Or the opposite - being so fidgety or restless that you have been moving around a lot more than usual: 0 Thoughts that you would be better off dead, or of hurting yourself in some way: 0 PHQ-9 Total Score: 0 If you checked off any problems, how difficult have these problems made it for you to do your work, take care of things at home, or get along with other people?: Not difficult at all  Depression Treatment Depression Interventions/Treatment : EYV7-0 Score <4 Follow-up Not Indicated     Goals Addressed             This Visit's Progress    COMPLETED: Patient Stated       Stay active  Objective:    Today's Vitals   11/21/24 1441  Weight: 166 lb (75.3 kg)  Height: 5' 5.5 (1.664 m)   Body mass index is 27.2 kg/m.  Hearing/Vision screen Hearing Screening - Comments:: Denies hearing difficulties   Vision Screening - Comments:: Wears eyeglasses/UTD/[facility]  Immunizations and Health Maintenance Health Maintenance  Topic Date Due   COVID-19 Vaccine (5 - 2025-26 season) 07/21/2024   Colonoscopy  08/25/2024   Pneumococcal Vaccine: 50+ Years (1 of 2 - PCV) 09/24/2025 (Originally 12/04/1974)   Bone Density Scan  03/22/2025   Medicare Annual Wellness (AWV)  11/21/2025   Mammogram  03/28/2026   DTaP/Tdap/Td (3 - Td or Tdap) 01/15/2033   Influenza Vaccine  Completed   Hepatitis C Screening  Completed   Meningococcal B Vaccine  Aged Out   Zoster Vaccines- Shingrix  Discontinued        Assessment/Plan:  This  is a routine wellness examination for [place].  Patient Care Team: Geofm Glade PARAS, MD as PCP - General (Internal Medicine) Waddell Danelle ORN, MD as PCP - Electrophysiology (Cardiology) Loni Soyla LABOR, MD as PCP - Cardiology (Cardiology) Dusty Sudie DEL, MD (Inactive) as Consulting Physician (Cardiothoracic Surgery) Claudene Victory ORN, MD (Inactive) as Consulting Physician (Cardiology) Tat, Asberry RAMAN, DO as Consulting Physician (Neurology) Fate Morna SAILOR, [facility] (Inactive) as Pharmacist (Pharmacist) Robinson Mayo, OD as Referring Physician (Optometry)  I have personally reviewed and noted the following in the patients chart:   Medical and social history Use of alcohol, tobacco or illicit drugs  Current medications and supplements including opioid prescriptions. Functional ability and status Nutritional status Physical activity Advanced directives List of other physicians Hospitalizations, surgeries, and ER visits in previous 12 months Vitals Screenings to include cognitive, depression, and falls Referrals and appointments  No orders of the defined types were placed in this encounter.  In addition, I have reviewed and discussed with patient certain preventive protocols, quality metrics, and best practice recommendations. A written personalized care plan for preventive services as well as general preventive health recommendations were provided to patient.   Shannan Garfinkel L Beauty Pless, CMA   11/21/2024   Return in 1 year (on 11/21/2025).  After Visit Summary: (MyChart) Due to this being a telephonic visit, the after visit summary with patients personalized plan was offered to patient via MyChart   Nurse Notes: Patient is due for a colonoscopy and stated that she is scheduled.  She had no other concerns to address today. "

## 2024-11-21 NOTE — Progress Notes (Signed)
 PERIOPERATIVE PRESCRIPTION FOR IMPLANTED CARDIAC DEVICE PROGRAMMING   Patient Information:  Patient: Ariel Simpson  MRN: [id number]  Date of Birth: 1956-02-15    Procedure:  COLONOSCOPY   Date of Surgery:  Clearance 01/06/25                                  Surgeon:  DR. DIANNA Surgeon's Group or Practice Name:  [facility] Phone number:  [phone] Fax number:  [phone]     Device Information:   Clinic EP Physician:   Dr. Nathaneil Furbish Device Type:  Pacemaker Manufacturer and Phone #:  Medtronic: [phone] Pacemaker Dependent?:  No Date of Last Device Check:  09/29/2024         Normal Device Function?:  Yes     Electrophysiologist's Recommendations:   Have magnet available. Provide continuous ECG monitoring when magnet is used or reprogramming is to be performed.  Procedure will likely interfere with device function.  Device should be programmed:  Asynchronous pacing during procedure and returned to normal programming after procedure  Per Device Clinic Standing Orders, Ariel Simpson  11/21/2024 2:47 PM

## 2024-11-21 NOTE — Telephone Encounter (Signed)
 Dr. Waddell, you recently saw this pt in clinic. Are you able to comment on surgical clearance for upcoming colonoscopy scheduled for 01/06/2025? Please route your response to P CV DIV PREOP. Thank you!

## 2024-11-21 NOTE — Telephone Encounter (Signed)
 Pharmacy please advise on the need for antibiotics prior to upcoming colonoscopy per request from surgeon's office scheduled for 01/06/2025. Thank you.

## 2024-11-21 NOTE — Telephone Encounter (Signed)
"  ° °  Pre-operative Risk Assessment    Patient Name: Ariel Simpson  DOB: 10-Oct-1956 MRN: [id number]   Date of last office visit: 09/29/24 DR. TAYLOR Date of next office visit: 01/05/25 DR. [facility]   Request for Surgical Clearance    Procedure:  COLONOSCOPY  Date of Surgery:  Clearance 01/06/25                                Surgeon:  DR. DIANNA Surgeon's Group or Practice Name:  [facility] Phone number:  [phone] Fax number:  [phone]   Type of Clearance Requested:   - Medical   FORM STATES: DOES PT NEED IV ABX PRIOR TO PROCEDURE? PT HAD ABX AT HER LAST COLONOSCOPY     Type of Anesthesia:  PROPOFOL     Additional requests/questions:    Signed, Camyah Pultz   11/21/2024, 2:07 PM   "

## 2024-11-24 ENCOUNTER — Other Ambulatory Visit: Payer: Self-pay

## 2024-11-25 ENCOUNTER — Encounter: Payer: Self-pay | Admitting: Optometry

## 2024-11-25 ENCOUNTER — Ambulatory Visit: Attending: Optometry | Admitting: Optometry

## 2024-11-25 DIAGNOSIS — H02831 Dermatochalasis of right upper eyelid: Secondary | ICD-10-CM | POA: Insufficient documentation

## 2024-11-25 DIAGNOSIS — D3131 Benign neoplasm of right choroid: Secondary | ICD-10-CM | POA: Insufficient documentation

## 2024-11-25 DIAGNOSIS — H02834 Dermatochalasis of left upper eyelid: Secondary | ICD-10-CM | POA: Insufficient documentation

## 2024-11-25 DIAGNOSIS — Z961 Presence of intraocular lens: Secondary | ICD-10-CM | POA: Insufficient documentation

## 2024-11-25 DIAGNOSIS — H524 Presbyopia: Secondary | ICD-10-CM | POA: Insufficient documentation

## 2024-11-25 DIAGNOSIS — H26491 Other secondary cataract, right eye: Secondary | ICD-10-CM | POA: Insufficient documentation

## 2024-11-25 DIAGNOSIS — H52223 Regular astigmatism, bilateral: Secondary | ICD-10-CM | POA: Insufficient documentation

## 2024-11-25 NOTE — Progress Notes (Signed)
 Assessment: 1. Choroidal nevus of right eye (Primary)stable2. Dermatochalasis of both upper eyelids3. Pseudophakia of both eyes4. Posterior capsular opacification, rightincipient5. Regular astigmatism, bilateral6. Presbyopia  Plan: Rx subjective findings in PAL design as optional change. Discussed findings and addressed concerns. RTC in 60yr w/ DFE OU for CEE, monitor PCO OD, nevus OD.

## 2024-11-26 ENCOUNTER — Encounter: Payer: Self-pay | Admitting: Gastroenterology

## 2024-11-26 ENCOUNTER — Ambulatory Visit

## 2024-11-26 ENCOUNTER — Telehealth (HOSPITAL_BASED_OUTPATIENT_CLINIC_OR_DEPARTMENT_OTHER): Payer: Self-pay | Admitting: *Deleted

## 2024-11-26 NOTE — Telephone Encounter (Signed)
 Pt has been scheduled tele preop appt 12/08/24. Med rec and consent are done.

## 2024-11-26 NOTE — Telephone Encounter (Signed)
 Pt has been scheduled tele preop appt 12/08/24. Med rec and consent are done.      Patient Consent for Virtual Visit        Ariel Simpson has provided verbal consent on 11/26/2024 for a virtual visit (video or telephone).   CONSENT FOR VIRTUAL VISIT FOR:  Ariel Simpson  By participating in this virtual visit I agree to the following:  I hereby voluntarily request, consent and authorize [facility] and its employed or contracted physicians, physician assistants, nurse practitioners or other licensed health care professionals (the Practitioner), to provide me with telemedicine health care services (the Services) as deemed necessary by the treating Practitioner. I acknowledge and consent to receive the Services by the Practitioner via telemedicine. I understand that the telemedicine visit will involve communicating with the Practitioner through live audiovisual communication technology and the disclosure of certain medical information by electronic transmission. I acknowledge that I have been given the opportunity to request an in-person assessment or other available alternative prior to the telemedicine visit and am voluntarily participating in the telemedicine visit.  I understand that I have the right to withhold or withdraw my consent to the use of telemedicine in the course of my care at any time, without affecting my right to future care or treatment, and that the Practitioner or I may terminate the telemedicine visit at any time. I understand that I have the right to inspect all information obtained and/or recorded in the course of the telemedicine visit and may receive copies of available information for a reasonable fee.  I understand that some of the potential risks of receiving the Services via telemedicine include:  Delay or interruption in medical evaluation due to technological equipment failure or disruption; Information transmitted may not be sufficient (e.g. poor  resolution of images) to allow for appropriate medical decision making by the Practitioner; and/or  In rare instances, security protocols could fail, causing a breach of personal health information.  Furthermore, I acknowledge that it is my responsibility to provide information about my medical history, conditions and care that is complete and accurate to the best of my ability. I acknowledge that Practitioner's advice, recommendations, and/or decision may be based on factors not within their control, such as incomplete or inaccurate data provided by me or distortions of diagnostic images or specimens that may result from electronic transmissions. I understand that the practice of medicine is not an exact science and that Practitioner makes no warranties or guarantees regarding treatment outcomes. I acknowledge that a copy of this consent can be made available to me via my patient portal [facility] MyChart), or I can request a printed copy by calling the office of  [facility].    I understand that my insurance will be billed for this visit.   I have read or had this consent read to me. I understand the contents of this consent, which adequately explains the benefits and risks of the Services being provided via telemedicine.  I have been provided ample opportunity to ask questions regarding this consent and the Services and have had my questions answered to my satisfaction. I give my informed consent for the services to be provided through the use of telemedicine in my medical care

## 2024-11-26 NOTE — Telephone Encounter (Signed)
" ° °  Name: Ariel Simpson  DOB: 1956-08-07  MRN: [id number]  Primary Cardiologist: Soyla DELENA Merck, MD   Preoperative team, please contact this patient and set up a phone call appointment for further preoperative risk assessment. Please obtain consent and complete medication review. Thank you for your help.  I confirm that guidance regarding antiplatelet and oral anticoagulation therapy has been completed and, if necessary, noted below.  I also confirmed the patient resides in the state of [place] . As per [facility] Medical Board telemedicine laws, the patient must reside in the state in which the provider is licensed.   Rollo FABIENE Louder, PA-C 11/26/2024, 9:13 AM [facility]    "

## 2024-11-28 ENCOUNTER — Ambulatory Visit: Admitting: Occupational Therapy

## 2024-11-28 NOTE — Telephone Encounter (Signed)
 Guidelines do not recommend prophylactic antibiotics for routine colonoscopies.  Would only consider if active GI infection

## 2024-12-08 ENCOUNTER — Ambulatory Visit: Attending: Cardiovascular Disease | Admitting: Emergency Medicine

## 2024-12-08 ENCOUNTER — Encounter: Payer: Self-pay | Admitting: Internal Medicine

## 2024-12-08 DIAGNOSIS — Z01810 Encounter for preprocedural cardiovascular examination: Secondary | ICD-10-CM

## 2024-12-08 NOTE — Progress Notes (Signed)
 "   Virtual Visit via Telephone Note   Because of WINOLA DRUM co-morbid illnesses, she is at least at moderate risk for complications without adequate follow up.  This format is felt to be most appropriate for this patient at this time.  Due to technical limitations with video connection (technology), today's appointment will be conducted as an audio only telehealth visit, and Ariel Simpson verbally agreed to proceed in this manner.   All issues noted in this document were discussed and addressed.  No physical exam could be performed with this format.  Evaluation Performed:  Preoperative cardiovascular risk assessment _____________   Date:  12/08/2024   Patient ID:  Ariel Simpson, DOB 12-28-1955, MRN [id number] Patient Location:  Home Provider location:   Office  Primary Care Provider:  Geofm Glade PARAS, MD Primary Cardiologist:  Soyla DELENA Merck, MD  Chief Complaint / Patient Profile   69 y.o. y/o female with a h/o syncope s/p DDD PPM insertion, PAF, SVT, hypertension, mitral valve regurgitation s/p mitral valve repair who is pending colonoscopy on 01/06/2025 with [facility] and presents today for telephonic preoperative cardiovascular risk assessment.  History of Present Illness    Ariel Simpson is a 69 y.o. female who presents via audio/video conferencing for a telehealth visit today.  Pt was last seen in cardiology clinic on 09/29/2024 by Dr. Waddell.  At that time Ariel Simpson was doing well.  The patient is now pending procedure as outlined above. Since her last visit, she is doing well without acute cardiovascular concerns or complaints.  She stays very active where she walks 1 to 2 miles as well as yoga 5 times a week.  She is without any exertional symptoms and remains asymptomatic.  She denies chest pains, dyspnea, syncope.  She is without any anginal symptoms.  She can complete greater than 4 METS.  Past Medical History    Past Medical History:  Diagnosis Date    Arthritis    Benign essential tremor    CHF (congestive heart failure) (HCC)    Heart murmur    High cholesterol    Hypertension    Insomnia    Mitral regurgitation    Mitral valve prolapse    Osteopenia    Paroxysmal atrial fibrillation (HCC) 03/11/2013   Paroxysmal supraventricular tachycardia    PONV (postoperative nausea and vomiting)    Dizziness and Nausea   S/P Maze operation for atrial fibrillation 03/18/2013   Complete bilateral atrial lesion set using cryothermy and biopolar radiofrequency ablation with oversewing of LA appendage via right mini thoracotomy   S/P mitral valve repair 03/18/2013   Complex valvuloplasty including triangular resection of posterior leaflet, artificial Gore-tex neocord placement x4 and 30mm Sorin Memo 3D ring annuloplasty via right mini thoracotomy   Severe mitral regurgitation 03/03/2013   Acute on chronic with acute heart failure    Shortness of breath    Wears glasses    Past Surgical History:  Procedure Laterality Date   ABDOMINAL HYSTERECTOMY     hernia repair   BIOPSY  08/26/2019   Procedure: BIOPSY;  Surgeon: Dianna Specking, MD;  Location: [facility];  Service: Endoscopy;;   CARDIAC CATHETERIZATION     COLONOSCOPY     COLONOSCOPY WITH PROPOFOL  N/A 08/26/2019   Procedure: COLONOSCOPY WITH PROPOFOL ;  Surgeon: Dianna Specking, MD;  Location: [facility];  Service: Endoscopy;  Laterality: N/A;   INTRAOPERATIVE TRANSESOPHAGEAL ECHOCARDIOGRAM N/A 03/18/2013   Procedure: INTRAOPERATIVE TRANSESOPHAGEAL ECHOCARDIOGRAM;  Surgeon: Sudie DEL  Dusty, MD;  Location: [facility] OR;  Service: Open Heart Surgery;  Laterality: N/A;   LEFT AND RIGHT HEART CATHETERIZATION WITH CORONARY ANGIOGRAM N/A 03/06/2013   Procedure: LEFT AND RIGHT HEART CATHETERIZATION WITH CORONARY ANGIOGRAM;  Surgeon: Victory LELON Claudene DOUGLAS, MD;  Location: [facility] CATH LAB;  Service: Cardiovascular;  Laterality: N/A;   LOOP RECORDER REMOVAL N/A 09/20/2022   Procedure: LOOP RECORDER REMOVAL;   Surgeon: Waddell Danelle LELON, MD;  Location: [facility] INVASIVE CV LAB;  Service: Cardiovascular;  Laterality: N/A;   MAZE N/A 03/18/2013   Procedure: MAZE;  Surgeon: Sudie VEAR Dusty, MD;  Location: [facility] OR;  Service: Open Heart Surgery;  Laterality: N/A;   MITRAL VALVE REPAIR Right 03/18/2013   Procedure: MINIMALLY INVASIVE MITRAL VALVE REPAIR (MVR);  Surgeon: Sudie VEAR Dusty, MD;  Location: [facility] OR;  Service: Open Heart Surgery;  Laterality: Right;   PACEMAKER IMPLANT N/A 09/20/2022   Procedure: PACEMAKER IMPLANT;  Surgeon: Waddell Danelle LELON, MD;  Location: [facility] INVASIVE CV LAB;  Service: Cardiovascular;  Laterality: N/A;   TRANSESOPHAGEAL ECHOCARDIOGRAM (CATH LAB) N/A 12/25/2023   Procedure: TRANSESOPHAGEAL ECHOCARDIOGRAM;  Surgeon: Loni Soyla LABOR, MD;  Location: [facility] INVASIVE CV LAB;  Service: Cardiovascular;  Laterality: N/A;   VARICOSE VEIN SURGERY     WISDOM TOOTH EXTRACTION      Allergies  Allergies[1]  Home Medications    Prior to Admission medications  Medication Sig Start Date End Date Taking? Authorizing Provider  amLODipine  (NORVASC ) 5 MG tablet Take 1 tablet (5 mg total) by mouth daily. 12/25/23 12/24/24  Acharya, Gayatri A, MD  amoxicillin  (AMOXIL ) 500 MG capsule TAKE 4 CAPSULES BY MOUTH 1 HOUR BEFORE DENTAL APPOINTMENT 02/23/21   Claudene Victory LELON, MD  aspirin  81 MG tablet Take 1 tablet (81 mg total) by mouth daily. 03/27/16   Claudene Victory LELON, MD  atorvastatin  (LIPITOR) 20 MG tablet Take 1 tablet (20 mg total) by mouth daily. 10/27/24   Geofm Glade PARAS, MD  BIOTIN PO Place 1 patch onto the skin daily.    [provider]  furosemide  (LASIX ) 20 MG tablet Take 1 tablet (20 mg total) by mouth daily as needed (shortness of breath or swelling). 02/14/24 11/26/24  Acharya, Gayatri A, MD  Glucosamine Sulfate-MSM (MSM-GLUCOSAMINE EX) Place 1 patch onto the skin daily.    [provider]  losartan  (COZAAR ) 50 MG tablet Take 1 tablet (50 mg total) by mouth 2 (two) times daily. 10/06/24   Acharya, Gayatri  A, MD  metoprolol  succinate (TOPROL  XL) 25 MG 24 hr tablet Take 1 tablet (25 mg total) by mouth 2 (two) times daily. 06/09/24   Acharya, Gayatri A, MD  Omega-3 Fatty Acids (FISH OIL PO) Take 2 capsules by mouth daily.    [provider]  polyethylene glycol (MIRALAX  / GLYCOLAX ) packet Take 17 g by mouth daily.    [provider]  Propylene Glycol, PF, (SYSTANE COMPLETE PF) 0.6 % SOLN Place 1 drop into both eyes as needed (dry eyes).    [provider]  TURMERIC PO Take 1 tablet by mouth in the morning and at bedtime.    [provider]  zolpidem  (AMBIEN ) 5 MG tablet TAKE 1/2 TABLET(2.5 MG) BY MOUTH AT BEDTIME AS NEEDED FOR SLEEP 09/30/24   Geofm Glade PARAS, MD    Physical Exam    Vital Signs:  Ariel Simpson does not have vital signs available for review today.  Given telephonic nature of communication, physical exam is limited. AAOx3. NAD. Normal affect.  Speech and respirations are unlabored.  Accessory Clinical Findings    None  Assessment & Plan    1.  Preoperative Cardiovascular Risk Assessment: According to the Revised Cardiac Risk Index (RCRI), her Perioperative Risk of Major Cardiac Event is (%): 0.4. Her Functional Capacity in METs is: 5.81 according to the Duke Activity Status Index (DASI).  Therefore, based on ACC/AHA guidelines, patient would be at acceptable risk for the planned procedure without further cardiovascular testing. I will route this recommendation to the requesting party via Epic fax function.  The patient was advised that if she develops new symptoms prior to surgery to contact our office to arrange for a follow-up visit, and she verbalized understanding.  She may hold Aspirin  for 7 days prior to procedure. Please resume aspirin  as soon as possible postprocedure, at the discretion of the surgeon.  Guidelines do not recommend prophylactic antibiotics for routine colonoscopies. Would only consider if active GI infection   A  copy of this note will be routed to requesting surgeon.  Time:   Today, I have spent 10 minutes with the patient with telehealth technology discussing medical history, symptoms, and management plan.     Lum LITTIE Louis, NP  12/08/2024, 9:57 AM     [1] No Known Allergies  "

## 2024-12-09 MED ORDER — AMLODIPINE BESYLATE 5 MG PO TABS: 5.0000 mg | ORAL_TABLET | Freq: Every day | ORAL | 3 refills | Status: AC

## 2024-12-09 MED ORDER — METOPROLOL SUCCINATE ER 25 MG PO TB24: 25.0000 mg | ORAL_TABLET | Freq: Two times a day (BID) | ORAL | 3 refills | Status: AC

## 2024-12-17 ENCOUNTER — Ambulatory Visit: Attending: Cardiovascular Disease

## 2024-12-17 DIAGNOSIS — I495 Sick sinus syndrome: Secondary | ICD-10-CM

## 2024-12-17 LAB — CUP PACEART REMOTE DEVICE CHECK
Battery Remaining Longevity: 137 mo
Battery Voltage: 3.03 V
Brady Statistic AP VP Percent: 0.05 %
Brady Statistic AP VS Percent: 96.72 %
Brady Statistic AS VP Percent: 0 %
Brady Statistic AS VS Percent: 3.23 %
Brady Statistic RA Percent Paced: 97.72 %
Brady Statistic RV Percent Paced: 0.05 %
Date Time Interrogation Session: 20260127192935
Implantable Lead Connection Status: 753985
Implantable Lead Connection Status: 753985
Implantable Lead Implant Date: 20231101
Implantable Lead Implant Date: 20231101
Implantable Lead Location: 753859
Implantable Lead Location: 753860
Implantable Lead Model: 3830
Implantable Lead Model: 5076
Implantable Pulse Generator Implant Date: 20231101
Lead Channel Impedance Value: 323 Ohm
Lead Channel Impedance Value: 361 Ohm
Lead Channel Impedance Value: 437 Ohm
Lead Channel Impedance Value: 513 Ohm
Lead Channel Pacing Threshold Amplitude: 0.625 V
Lead Channel Pacing Threshold Amplitude: 0.875 V
Lead Channel Pacing Threshold Pulse Width: 0.4 ms
Lead Channel Pacing Threshold Pulse Width: 0.4 ms
Lead Channel Sensing Intrinsic Amplitude: 1.25 mV
Lead Channel Sensing Intrinsic Amplitude: 1.25 mV
Lead Channel Sensing Intrinsic Amplitude: 14.25 mV
Lead Channel Sensing Intrinsic Amplitude: 14.25 mV
Lead Channel Setting Pacing Amplitude: 1.5 V
Lead Channel Setting Pacing Amplitude: 2 V
Lead Channel Setting Pacing Pulse Width: 0.4 ms
Lead Channel Setting Sensing Sensitivity: 1.2 mV
Zone Setting Status: 755011

## 2024-12-18 ENCOUNTER — Ambulatory Visit: Payer: Self-pay | Admitting: Cardiovascular Disease

## 2024-12-19 NOTE — Progress Notes (Unsigned)
 " Assessment/Plan:  Assessment and Plan Assessment & Plan Essential tremor Chronic, slowly progressive essential tremor with significant functional impairment, predominantly affecting the dominant right hand, now involving the jaw and left side. - Discussed natural history and non-neurodegenerative nature of essential tremor, distinguishing it from Parkinson's disease. - Reviewed pharmacologic options: metoprolol  with limited efficacy now per pt; propranolol with greater efficacy but risk of hypotension; low-dose primidone with favorable side effect profile.  Pt decided not to change right now (cardiologist was willing) - Explained primidone r/b/se in detail but ultimately she didn't want to proceed right now -she asks about [employer].  Discussed that its contraindicated in her due to PPM.  I have not found it all that effective either -she asked about botox.  No FDA indication although can be helpful, but very costly - Engaged in shared decision making regarding initiation of primidone; she elected to defer new tremor medications until after cardiac surgery and recovery.    History of recurrent syncope, now known to be due to sinus pauses -Syncope captured on loop recorder and patient now with permanent pacemaker -Following with cardiology.   PAF, per cardiology notes -Currently not on anticoagulants.  If she gets placed on DOAC's, she will not be eligible for primidone due to interaction.  Mitral valve regurg -awaiting consult with cardiovascular surgery  Subjective:   TEILA SKALSKY was seen in consultation in follow-up.  I last saw the patient in November, 2024.  Tremor has been going on for well over a decade.  The right hand is worse than the left.  Patient is right-hand dominant.  Patient is on metoprolol .  For a short period of time, the patient's beta-blocker was discontinued after she had a pacemaker placed and tremor got much worse.  She was placed back on metoprolol  and did  better.  However, tremor has worsened.  She spoke to her primary care about it who sent her to occupational therapy to see if there was things that they could help with to assist with activities of daily living.  Notes from occupational therapy are reviewed.  Having trouble eating with the R hand.  Having trouble writing but can do they keyboard but has trouble with the touchpad.  Has some jaw tremor and feels it in her breath when doing yoga.  She is getting ready to have consult with vascular surgery for MVR to see if she needs surgery.  She has palpitations in that regard.  She has a buzzing feeling in the chest and wonders if that is neurologic.    Current/Previously tried tremor medications: metoprolol   Current medications that may exacerbate tremor:  n/a  Outside reports reviewed: lab reports, office notes, and referral letter/letters.  No Known Allergies  Current Meds  Medication Sig   amLODipine  (NORVASC ) 5 MG tablet Take 1 tablet (5 mg total) by mouth daily.   amoxicillin  (AMOXIL ) 500 MG capsule TAKE 4 CAPSULES BY MOUTH 1 HOUR BEFORE DENTAL APPOINTMENT   aspirin  81 MG tablet Take 1 tablet (81 mg total) by mouth daily.   atorvastatin  (LIPITOR) 20 MG tablet Take 1 tablet (20 mg total) by mouth daily.   BIOTIN PO Place 1 patch onto the skin daily.   furosemide  (LASIX ) 20 MG tablet Take 1 tablet (20 mg total) by mouth daily as needed (shortness of breath or swelling).   Glucosamine Sulfate-MSM (MSM-GLUCOSAMINE EX) Place 1 patch onto the skin daily.   losartan  (COZAAR ) 50 MG tablet Take 1 tablet (50 mg total) by  mouth 2 (two) times daily.   metoprolol  succinate (TOPROL  XL) 25 MG 24 hr tablet Take 1 tablet (25 mg total) by mouth 2 (two) times daily.   Omega-3 Fatty Acids (FISH OIL PO) Take 2 capsules by mouth daily.   polyethylene glycol (MIRALAX  / GLYCOLAX ) packet Take 17 g by mouth daily.   Propylene Glycol, PF, (SYSTANE COMPLETE PF) 0.6 % SOLN Place 1 drop into both eyes as needed (dry  eyes).   TURMERIC PO Take 1 tablet by mouth in the morning and at bedtime.   zolpidem  (AMBIEN ) 5 MG tablet TAKE 1/2 TABLET(2.5 MG) BY MOUTH AT BEDTIME AS NEEDED FOR SLEEP    Objective:   VITALS:   Vitals:   12/23/24 1254  BP: 130/72  Pulse: 66  SpO2: 96%  Weight: 175 lb 9.6 oz (79.7 kg)  Height: 5' 5 (1.651 m)     GEN:  The patient appears stated age and is in NAD. HEENT:  Normocephalic, atraumatic.  The mucous membranes are moist. The superficial temporal arteries are without ropiness or tenderness. CV:  RRR Lungs:  CTAB Neck/HEME:  There are no carotid bruits bilaterally.   Neurological examination:   Orientation: The patient is alert and oriented x3.  Cranial nerves: There is good facial symmetry.  Extraocular muscles are intact. The visual fields are full to confrontational testing. The speech is fluent and clear. Soft palate rises symmetrically and there is no tongue deviation. Hearing is intact to conversational tone. Sensation: Sensation is intact to light touch throughout Motor: Strength is at least antigravity x 4   Movement examination: Tone: There is normal tone in the bilateral upper extremities.  The tone in the lower extremities is normal.  Abnormal movements: There is no postural tremor.  There is mild intention tremor on the right.  When drawing Archimedes spirals with the right hand, there is minimal tremor in the left thumb.  She does have trouble with Archimedes spirals on the right  Coordination:  There is no decremation with RAM's, with any form of RAMS, including alternating supination and pronation of the forearm, hand opening and closing, finger taps, heel taps and toe taps   I have reviewed and interpreted the following labs independently   Chemistry      Component Value Date/Time   NA 139 09/24/2024 1413   NA 141 12/17/2023 1159   K 4.3 09/24/2024 1413   CL 102 09/24/2024 1413   CO2 30 09/24/2024 1413   BUN 14 09/24/2024 1413   BUN 11  12/17/2023 1159   CREATININE 0.81 09/24/2024 1413   CREATININE 0.96 10/10/2013 1606      Component Value Date/Time   CALCIUM  9.6 09/24/2024 1413   ALKPHOS 72 09/24/2024 1413   AST 23 09/24/2024 1413   ALT 21 09/24/2024 1413   BILITOT 0.9 09/24/2024 1413   BILITOT 0.6 08/25/2019 1134      Lab Results  Component Value Date   WBC 4.8 09/24/2024   HGB 13.2 09/24/2024   HCT 38.2 09/24/2024   MCV 90.5 09/24/2024   PLT 190.0 09/24/2024   Lab Results  Component Value Date   TSH 1.78 09/24/2024   Total time spent on today's visit was 49 minutes, including both face-to-face time and nonface-to-face time.  Time included that spent on review of records (prior notes available to me/labs/imaging if pertinent), discussing treatment and goals, answering patient's questions and coordinating care.     CC:  Geofm Glade PARAS, MD   "

## 2024-12-22 ENCOUNTER — Other Ambulatory Visit (HOSPITAL_COMMUNITY)

## 2024-12-22 ENCOUNTER — Encounter: Payer: Self-pay | Admitting: Internal Medicine

## 2024-12-22 ENCOUNTER — Ambulatory Visit (HOSPITAL_COMMUNITY): Admission: RE | Admit: 2024-12-22 | Discharge: 2024-12-22 | Attending: Internal Medicine

## 2024-12-22 ENCOUNTER — Ambulatory Visit (HOSPITAL_COMMUNITY)

## 2024-12-22 DIAGNOSIS — Z9889 Other specified postprocedural states: Secondary | ICD-10-CM

## 2024-12-22 DIAGNOSIS — I340 Nonrheumatic mitral (valve) insufficiency: Secondary | ICD-10-CM

## 2024-12-23 ENCOUNTER — Encounter: Payer: Self-pay | Admitting: Internal Medicine

## 2024-12-23 ENCOUNTER — Encounter: Payer: Self-pay | Admitting: Neurology

## 2024-12-23 ENCOUNTER — Ambulatory Visit: Admitting: Internal Medicine

## 2024-12-23 ENCOUNTER — Ambulatory Visit: Payer: Self-pay | Admitting: Internal Medicine

## 2024-12-23 ENCOUNTER — Ambulatory Visit: Admitting: Neurology

## 2024-12-23 VITALS — BP 124/78 | HR 65 | Ht 65.0 in | Wt 174.6 lb

## 2024-12-23 VITALS — BP 130/72 | HR 66 | Ht 65.0 in | Wt 175.6 lb

## 2024-12-23 DIAGNOSIS — Z9889 Other specified postprocedural states: Secondary | ICD-10-CM

## 2024-12-23 DIAGNOSIS — I340 Nonrheumatic mitral (valve) insufficiency: Secondary | ICD-10-CM

## 2024-12-23 DIAGNOSIS — I10 Essential (primary) hypertension: Secondary | ICD-10-CM

## 2024-12-23 DIAGNOSIS — G250 Essential tremor: Secondary | ICD-10-CM | POA: Diagnosis not present

## 2024-12-23 LAB — ECHOCARDIOGRAM COMPLETE
Area-P 1/2: 1.8 cm2
MV M vel: 7.2 m/s
MV Peak grad: 207.4 mmHg
MV VTI: 1.72 cm2
P 1/2 time: 411 ms
Radius: 1 cm
S' Lateral: 3.1 cm

## 2024-12-23 NOTE — Progress Notes (Unsigned)
 " Cardiology Office Note:  .   Date:  12/23/2024  ID:  Ariel Simpson, DOB 12/29/1955, MRN [id number] PCP: Geofm Glade PARAS, MD  [facility] Providers Cardiologist:  Soyla DELENA Merck, MD Electrophysiologist:  Danelle Birmingham, MD    History of Present Illness: .   Ariel Simpson is a 69 y.o. female.  Discussed the use of AI scribe software for clinical note transcription with the patient, who gave verbal consent to proceed.  History of Present Illness Ariel Simpson is a 69 year old female with atrial fibrillation and mitral valve regurgitation who presents for evaluation of her cardiac symptoms.  She is bothered by increased awareness of her pulse, especially when lying down to sleep. She has longstanding sleep difficulty and occasionally uses low-dose sleeping pills, but has recently been able to sleep without them.  She notes slightly increased shortness of breath when running up stairs, though she does not need to stop and rest. She remains active with yoga, kayaking, and shoveling snow without significant dyspnea, and feels her knees and ankles limit her more than her breathing.  She has atrial fibrillation and supraventricular tachycardia treated with metoprolol . She has not had atrial fibrillation since an episode in 2014 while awaiting surgery, and her pacemaker has not recorded atrial fibrillation.  She had a transesophageal echocardiogram last year that showed moderate to severe mitral regurgitation under anesthesia.  She also has an essential tremor that she feels affects her breath flow and cervical spine and plans to address this with her neurologist.    ROS: negative except per HPI above.  Studies Reviewed: .        Results Diagnostic Transthoracic echocardiogram (12/22/2024): Severe mitral regurgitation; normal left ventricular ejection fraction; preserved left ventricular systolic function; left atrial dilation; no left ventricular dysfunction (Independently  interpreted) Transesophageal echocardiogram (2025): Moderate to severe mitral regurgitation under anesthesia with low blood pressure; left atrial dilation (Independently interpreted) Pacemaker interrogation (12/19/2024): Atrial pacing and ventricular sensing; no atrial fibrillation Risk Assessment/Calculations:   {Does this patient have ATRIAL FIBRILLATION?:[phone]}   Physical Exam:   VS:  BP 124/78 (BP Location: Left Arm, Patient Position: Sitting)   Pulse 65   Ht 5' 5 (1.651 m)   Wt 174 lb 9.6 oz (79.2 kg)   SpO2 98%   BMI 29.05 kg/m    Wt Readings from Last 3 Encounters:  12/23/24 174 lb 9.6 oz (79.2 kg)  11/21/24 166 lb (75.3 kg)  09/29/24 166 lb (75.3 kg)     Physical Exam VITALS: BP- 124/78 GENERAL: Alert, cooperative, well developed, no acute distress. HEENT: Normocephalic, normal oropharynx, moist mucous membranes. CHEST: Clear to auscultation bilaterally, no wheezes, rhonchi, or crackles. CARDIOVASCULAR: Normal heart rate and rhythm, S1 and S2 normal without murmurs. ABDOMEN: Soft, non-tender, non-distended, without organomegaly, normal bowel sounds. EXTREMITIES: No cyanosis or edema. NEUROLOGICAL: Cranial nerves grossly intact, moves all extremities without gross motor or sensory deficit.   ASSESSMENT AND PLAN: .    Assessment and Plan Assessment & Plan Severe mitral valve regurgitation after mitral valve repair with left atrial enlargement Severe regurgitation persists post-repair, exacerbated by hypertension. No left ventricular dysfunction. Active lifestyle without significant symptoms. Surgery deferred; risks include potential left ventricular function worsening. Emphasized monitoring symptoms and heart function. - Ordered echocardiogram in six months to monitor mitral valve regurgitation and left ventricular function. - Referred to CVTS for consultation regarding mitral valve regurgitation and potential surgical intervention. - Scheduled follow-up visit  after echocardiogram.  Essential hypertension  Blood pressure well-controlled with current medication. Stable fluid status. - Continue Amlodipine  5 mg oral daily, Losartan  50 mg oral bid, Metoprolol  succinate 25 mg oral bid.  History of atrial fibrillation and supraventricular tachycardia No recent episodes. Pacemaker functioning well with no arrhythmias detected. - Continue current management with metoprolol  succinate.  Presence of cardiac pacemaker Pacemaker functioning well. No atrial fibrillation detected on recent interrogation. - Continue routine pacemaker monitoring.  Recording duration: 26 minutes      Soyla Merck, MD, [facility] "

## 2024-12-23 NOTE — Patient Instructions (Signed)
" °  VISIT SUMMARY: During your visit, we discussed the worsening of your essential tremor and its impact on your daily activities. We reviewed your current medications and the limited benefit from occupational therapy and adaptive devices. We also considered your upcoming cardiac surgery and its implications for your treatment plan.  YOUR PLAN: -ESSENTIAL TREMOR: Essential tremor is a chronic condition that causes involuntary shaking, primarily affecting your right hand and now involving your jaw and left side. We discussed the natural history of essential tremor and how it differs from Parkinson's disease. We reviewed medication options, including metoprolol , propranolol, and primidone, and their potential benefits and side effects. You decided to defer starting new tremor medications until after your cardiac surgery and recovery.  INSTRUCTIONS: Please follow up after your cardiac surgery and recovery to reassess your essential tremor and consider starting new medications at that time.    Contains text generated by [employer].   "

## 2024-12-23 NOTE — Patient Instructions (Addendum)
 Medication Instructions:  No Changes  Lab Work: None  Testing/Procedures: Your physician has requested that you have an echocardiogram in six months (due around 8/03/026). Echocardiography is a painless test that uses sound waves to create images of your heart. It provides your doctor with information about the size and shape of your heart and how well your hearts chambers and valves are working. This procedure takes approximately one hour. There are no restrictions for this procedure. Please do NOT wear cologne, perfume, aftershave, or lotions (deodorant is allowed). Please arrive 15 minutes prior to your appointment time.  Please note: We ask at that you not bring children with you during ultrasound (echo/ vascular) testing. Due to room size and safety concerns, children are not allowed in the ultrasound rooms during exams. Our front office staff cannot provide observation of children in our lobby area while testing is being conducted. An adult accompanying a patient to their appointment will only be allowed in the ultrasound room at the discretion of the ultrasound technician under special circumstances. We apologize for any inconvenience.   Follow-Up: At [facility], you and your health needs are our priority.  As part of our continuing mission to provide you with exceptional heart care, our providers are all part of one team.  This team includes your primary Cardiologist (physician) and Advanced Practice Providers or APPs (Physician Assistants and Nurse Practitioners) who all work together to provide you with the care you need, when you need it.  Your next appointment:   6 month(s) (AFTER your Echocardiogram; please call in May or June for appointment)  Provider:   Gayatri A Acharya, MD   Other Instructions We have referred you to the [facility] Cardiothoracic Surgery team here. If you do not get a phone call for an appointment by around 01/07/2025, please call  [phone].

## 2024-12-24 ENCOUNTER — Encounter (HOSPITAL_COMMUNITY): Payer: Self-pay | Admitting: Gastroenterology

## 2024-12-24 NOTE — Progress Notes (Signed)
 Attempted to obtain medical history for pre op call via telephone, unable to reach at this time. HIPAA compliant voicemail message left requesting return call to pre surgical testing department.

## 2024-12-25 NOTE — Progress Notes (Signed)
 Remote PPM Transmission

## 2025-01-05 ENCOUNTER — Ambulatory Visit: Admitting: Internal Medicine

## 2025-01-06 ENCOUNTER — Encounter (HOSPITAL_COMMUNITY): Payer: Self-pay

## 2025-01-06 ENCOUNTER — Ambulatory Visit (HOSPITAL_COMMUNITY): Admit: 2025-01-06 | Admitting: Gastroenterology

## 2025-01-06 SURGERY — COLONOSCOPY
Anesthesia: Monitor Anesthesia Care

## 2025-01-12 ENCOUNTER — Ambulatory Visit: Admitting: Neurology

## 2025-01-13 ENCOUNTER — Ambulatory Visit: Admitting: Neurology

## 2025-01-14 ENCOUNTER — Encounter: Admitting: Surgery

## 2025-01-21 ENCOUNTER — Encounter: Admitting: Surgery

## 2025-02-02 ENCOUNTER — Ambulatory Visit: Admitting: Primary Care

## 2025-02-16 ENCOUNTER — Ambulatory Visit

## 2025-02-19 ENCOUNTER — Ambulatory Visit

## 2025-03-18 ENCOUNTER — Ambulatory Visit

## 2025-06-17 ENCOUNTER — Ambulatory Visit

## 2025-09-16 ENCOUNTER — Ambulatory Visit

## 2025-11-23 ENCOUNTER — Ambulatory Visit

## 2025-11-26 ENCOUNTER — Ambulatory Visit: Admitting: Optometry

## 2025-12-16 ENCOUNTER — Ambulatory Visit
